# Patient Record
Sex: Female | Born: 1935
Health system: Southern US, Community
[De-identification: ages and names within clinical notes are randomized; demographics above are authoritative.]

## PROBLEM LIST (undated history)

## (undated) DIAGNOSIS — E039 Hypothyroidism, unspecified: Secondary | ICD-10-CM

## (undated) DIAGNOSIS — I495 Sick sinus syndrome: Secondary | ICD-10-CM

## (undated) DIAGNOSIS — I480 Paroxysmal atrial fibrillation: Secondary | ICD-10-CM

## (undated) DIAGNOSIS — Z9889 Other specified postprocedural states: Secondary | ICD-10-CM

## (undated) DIAGNOSIS — Z952 Presence of prosthetic heart valve: Secondary | ICD-10-CM

## (undated) DIAGNOSIS — I350 Nonrheumatic aortic (valve) stenosis: Secondary | ICD-10-CM

## (undated) DIAGNOSIS — K649 Unspecified hemorrhoids: Secondary | ICD-10-CM

## (undated) DIAGNOSIS — K5790 Diverticulosis of intestine, part unspecified, without perforation or abscess without bleeding: Secondary | ICD-10-CM

## (undated) DIAGNOSIS — E785 Hyperlipidemia, unspecified: Secondary | ICD-10-CM

## (undated) DIAGNOSIS — K219 Gastro-esophageal reflux disease without esophagitis: Secondary | ICD-10-CM

## (undated) DIAGNOSIS — R112 Nausea with vomiting, unspecified: Secondary | ICD-10-CM

## (undated) DIAGNOSIS — Z950 Presence of cardiac pacemaker: Secondary | ICD-10-CM

## (undated) DIAGNOSIS — T4145XA Adverse effect of unspecified anesthetic, initial encounter: Secondary | ICD-10-CM

## (undated) DIAGNOSIS — I2510 Atherosclerotic heart disease of native coronary artery without angina pectoris: Secondary | ICD-10-CM

## (undated) DIAGNOSIS — D649 Anemia, unspecified: Secondary | ICD-10-CM

## (undated) DIAGNOSIS — M1990 Unspecified osteoarthritis, unspecified site: Secondary | ICD-10-CM

## (undated) DIAGNOSIS — T8859XA Other complications of anesthesia, initial encounter: Secondary | ICD-10-CM

## (undated) DIAGNOSIS — Z8489 Family history of other specified conditions: Secondary | ICD-10-CM

## (undated) DIAGNOSIS — N189 Chronic kidney disease, unspecified: Secondary | ICD-10-CM

## (undated) DIAGNOSIS — I10 Essential (primary) hypertension: Secondary | ICD-10-CM

## (undated) HISTORY — DX: Hypothyroidism, unspecified: E03.9

## (undated) HISTORY — DX: Unspecified hemorrhoids: K64.9

## (undated) HISTORY — DX: Hyperlipidemia, unspecified: E78.5

## (undated) HISTORY — PX: SQUAMOUS CELL CARCINOMA EXCISION: SHX2433

## (undated) HISTORY — PX: CARDIAC CATHETERIZATION: SHX172

## (undated) HISTORY — DX: Diverticulosis of intestine, part unspecified, without perforation or abscess without bleeding: K57.90

## (undated) HISTORY — PX: TONSILLECTOMY: SUR1361

## (undated) HISTORY — PX: BASAL CELL CARCINOMA EXCISION: SHX1214

## (undated) HISTORY — DX: Gastro-esophageal reflux disease without esophagitis: K21.9

## (undated) HISTORY — DX: Sick sinus syndrome: I49.5

## (undated) HISTORY — DX: Unspecified osteoarthritis, unspecified site: M19.90

## (undated) HISTORY — PX: CATARACT EXTRACTION W/ INTRAOCULAR LENS  IMPLANT, BILATERAL: SHX1307

## (undated) HISTORY — DX: Paroxysmal atrial fibrillation: I48.0

## (undated) HISTORY — PX: FINGER SURGERY: SHX640

## (undated) HISTORY — DX: Essential (primary) hypertension: I10

---

## 1978-03-05 HISTORY — PX: ABDOMINAL HYSTERECTOMY: SHX81

## 1991-03-06 HISTORY — PX: INSERT / REPLACE / REMOVE PACEMAKER: SUR710

## 1997-10-12 ENCOUNTER — Encounter: Admission: RE | Admit: 1997-10-12 | Discharge: 1998-01-10 | Payer: Self-pay | Admitting: *Deleted

## 1999-03-06 HISTORY — PX: PACEMAKER GENERATOR CHANGE: SHX5998

## 2000-01-02 ENCOUNTER — Ambulatory Visit (HOSPITAL_COMMUNITY): Admission: RE | Admit: 2000-01-02 | Discharge: 2000-01-03 | Payer: Self-pay | Admitting: Cardiovascular Disease

## 2000-01-03 ENCOUNTER — Encounter: Payer: Self-pay | Admitting: Cardiovascular Disease

## 2000-03-29 ENCOUNTER — Other Ambulatory Visit: Admission: RE | Admit: 2000-03-29 | Discharge: 2000-03-29 | Payer: Self-pay | Admitting: Family Medicine

## 2000-04-11 ENCOUNTER — Encounter: Payer: Self-pay | Admitting: Internal Medicine

## 2000-04-11 ENCOUNTER — Ambulatory Visit (HOSPITAL_COMMUNITY): Admission: RE | Admit: 2000-04-11 | Discharge: 2000-04-11 | Payer: Self-pay | Admitting: Internal Medicine

## 2000-04-28 ENCOUNTER — Inpatient Hospital Stay (HOSPITAL_COMMUNITY): Admission: EM | Admit: 2000-04-28 | Discharge: 2000-05-01 | Payer: Self-pay | Admitting: Emergency Medicine

## 2000-04-28 ENCOUNTER — Encounter: Payer: Self-pay | Admitting: Cardiovascular Disease

## 2002-10-12 ENCOUNTER — Encounter: Payer: Self-pay | Admitting: Internal Medicine

## 2002-10-12 ENCOUNTER — Encounter: Admission: RE | Admit: 2002-10-12 | Discharge: 2002-10-12 | Payer: Self-pay | Admitting: Internal Medicine

## 2004-03-27 ENCOUNTER — Ambulatory Visit: Payer: Self-pay | Admitting: Internal Medicine

## 2004-03-28 ENCOUNTER — Ambulatory Visit: Payer: Self-pay | Admitting: Internal Medicine

## 2004-04-03 ENCOUNTER — Ambulatory Visit: Payer: Self-pay | Admitting: Internal Medicine

## 2008-04-01 ENCOUNTER — Ambulatory Visit (HOSPITAL_COMMUNITY): Admission: RE | Admit: 2008-04-01 | Discharge: 2008-04-01 | Payer: Self-pay | Admitting: *Deleted

## 2008-04-01 HISTORY — PX: PACEMAKER GENERATOR CHANGE: SHX5998

## 2008-06-10 ENCOUNTER — Ambulatory Visit (HOSPITAL_COMMUNITY): Admission: RE | Admit: 2008-06-10 | Discharge: 2008-06-10 | Payer: Self-pay | Admitting: Vascular Surgery

## 2008-06-15 ENCOUNTER — Ambulatory Visit (HOSPITAL_BASED_OUTPATIENT_CLINIC_OR_DEPARTMENT_OTHER): Admission: RE | Admit: 2008-06-15 | Discharge: 2008-06-15 | Payer: Self-pay | Admitting: Orthopedic Surgery

## 2009-08-15 ENCOUNTER — Encounter: Payer: Self-pay | Admitting: Internal Medicine

## 2010-01-09 ENCOUNTER — Encounter: Payer: Self-pay | Admitting: Internal Medicine

## 2010-03-30 DIAGNOSIS — I10 Essential (primary) hypertension: Secondary | ICD-10-CM | POA: Insufficient documentation

## 2010-03-30 DIAGNOSIS — E785 Hyperlipidemia, unspecified: Secondary | ICD-10-CM | POA: Insufficient documentation

## 2010-03-30 DIAGNOSIS — I4891 Unspecified atrial fibrillation: Secondary | ICD-10-CM | POA: Insufficient documentation

## 2010-03-30 DIAGNOSIS — M1990 Unspecified osteoarthritis, unspecified site: Secondary | ICD-10-CM | POA: Insufficient documentation

## 2010-03-31 ENCOUNTER — Encounter: Payer: Self-pay | Admitting: Internal Medicine

## 2010-03-31 ENCOUNTER — Ambulatory Visit
Admission: RE | Admit: 2010-03-31 | Discharge: 2010-03-31 | Payer: Self-pay | Source: Home / Self Care | Attending: Internal Medicine | Admitting: Internal Medicine

## 2010-03-31 DIAGNOSIS — I495 Sick sinus syndrome: Secondary | ICD-10-CM | POA: Insufficient documentation

## 2010-04-06 NOTE — Assessment & Plan Note (Signed)
Summary: pacer check/medtroic   Visit Type:  Initial Consult Primary Provider:  Dr Creola Corn   History of Present Illness: Ms [name] [name] presents today to establish care in the pacemaker clinic.  She underwent initial pacemaker implantation in 1993 for symptomatic bradycardia and syncome.  She had a pacemaker pulse generator replacement in 2001 by Dr Alanda Amass.  Her most recent pacemaker pulse generator was placed 04/01/08 by Dr Reyes Ivan.  She reports doing very well recently.  She denies symptoms of palpitations, chest pain, shortness of breath, orthopnea, PND, lower extremity edema, dizziness, presyncope, syncope, or neurologic sequela. The patient is tolerating medications without difficulties and is otherwise without complaint today.   Current Medications (verified): 1)  Cartia Xt 240 Mg Xr24h-Cap (Diltiazem Hcl Coated Beads) .... Take 1 Tablet By Mouth Once A Day 2)  Metoprolol Succinate 25 Mg Xr24h-Tab (Metoprolol Succinate) .... Take One Tablet By Mouth Daily 3)  Synthroid 100 Mcg Tabs (Levothyroxine Sodium) .... Once Daily 4)  Nexium 40 Mg Cpdr (Esomeprazole Magnesium) .... Once Daily As Needed 5)  Celebrex 200 Mg Caps (Celecoxib) .... Once Daily As Needed 6)  Aspirin 81 Mg Tbec (Aspirin) .... Take One Tablet By Mouth Daily 7)  Multivitamins   Tabs (Multiple Vitamin) .... Once Daily 8)  Vitamin D 1000 Unit  Tabs (Cholecalciferol) .... Once Daily 9)  Fish Oil   Oil (Fish Oil) .... Once Daily 10)  Icaps Lutein-Zeaxanthin  Cr-Tabs (Specialty Vitamins Products) .... Once Daily 11)  Citracal/vitamin D 250-200 Mg-Unit Tabs (Calcium Citrate-Vitamin D) .... Once Daily  Allergies (verified): 1)  ! Pcn 2)  ! Codeine 3)  ! Demerol 4)  ! * Tetanus  Past History:  Past Medical History: Sick sinus syndrome s/p PPM 1993 Afib OSTEOARTHRITIS HYPERTENSION  HYPERLIPIDEMIA   Past Surgical History:  Status post percutaneous venous pacemaker DDD implant on Jul 17, 1991, at [facility] for  syncope, with documented bradycardia.  Gen change 2001 by Dr Alanda Amass, Most recent pulse generator by Dr Reyes Ivan 03/2008  Reconstruction of ulnar collateral ligament  Social History: Lives in [place] Denies TED  Review of Systems       All systems are reviewed and negative except as listed in the HPI.   Vital Signs:  Patient profile:   77 year old female Height:      66 inches Weight:      119 pounds BMI:     19.28 Pulse rate:   58 / minute BP sitting:   138 / 70  (left arm) Cuff size:   regular  Vitals Entered By: Hardin Negus, RMA (March 31, 2010 9:23 AM)  Physical Exam  General:  thin elderly female, NAD Head:  normocephalic and atraumatic Eyes:  PERRLA/EOM intact; conjunctiva and lids normal. Mouth:  Teeth, gums and palate normal. Oral mucosa normal. Neck:  supple Chest Wall:  R sided pacemaker is well healed Lungs:  Clear bilaterally to auscultation and percussion. Heart:  RRR, 2/6 SEM LUSB Abdomen:  Bowel sounds positive; abdomen soft and non-tender without masses, organomegaly, or hernias noted. No hepatosplenomegaly. Msk:  Back normal, normal gait. Muscle strength and tone normal. Extremities:  No clubbing or cyanosis. Neurologic:  Alert and oriented x 3.   PPM Specifications Following MD:  Hillis Range, MD     Referring MD:  Vonna Drafts PPM Vendor:  Medtronic     PPM Model Number:  [id number]     PPM Serial Number:  [id number] PPM DOI:  04/01/2008     PPM Implanting  MD:  Charlynn Court  Lead 1    Location: RA     DOI: 07/17/1991     Model #: [id number]     Serial #: [id number]     Status: active Lead 2    Location: RV     DOI: 07/17/1991     Model #: [id number]     Serial #: [id number]     Status: active  Magnet Response Rate:  BOL 85 ERI 65  Indications:  Sick sinus syndrome   PPM Follow Up Battery Voltage:  2.79 V     Battery Est. Longevity:  13.5 yrs       PPM Device Measurements Atrium  Amplitude: 4.00 mV, Impedance: 661 ohms, Threshold: 0.50 V at 0.40  msec Right Ventricle  Amplitude: 15.68 mV, Impedance: 663 ohms, Threshold: 1.750 V at 0.40 msec  Episodes MS Episodes:  1228     Percent Mode Switch:  <0.1%     Ventricular High Rate:  0     Atrial Pacing:  59.5%     Ventricular Pacing:  0.3%  Parameters Mode:  MVP     Lower Rate Limit:  55     Upper Rate Limit:  130 Paced AV Delay:  150     Sensed AV Delay:  120 Next Cardiology Appt Due:  09/04/2010 Tech Comments:  1228 MODE SWITCHES--LONGEST WAS 2 MIN 9 SECONDS.  +ASA. NORMAL DEVICE FUNCTION.  CHANGED MIN ADAPTED OUTPUT.  CHANGED RA OUTPUT FRO M1.5 TO 2.0 AND RV OUTPUT FROM 3.750 TO 3.5 V. ROV IN 6 MTHS W/DEVICE CLINIC. Vella Kohler  March 31, 2010 9:31 AM MD Comments:  agree most fast atrial sensing appears to be far field oversensing on atrial channel  Impression & Recommendations:  Problem # 1:  BRADYCARDIA-TACHYCARDIA SYNDROME (ICD-427.81) normal pacemaker function no changes as above  Problem # 2:  ATRIAL FIBRILLATION (ICD-427.31) very low atrial fib burden by PPM.  If afib burden increases, will consider coumadin  Problem # 3:  HYPERTENSION (ICD-401.9) stable  Patient Instructions: 1)  Your physician wants you to follow-up in:  6 months in the device clinic and 12 months with Dr Jacquiline Doe will receive a reminder letter in the mail two months in advance. If you don't receive a letter, please call our office to schedule the follow-up appointment. 2)  Your physician recommends that you continue on your current medications as directed. Please refer to the Current Medication list given to you today.

## 2010-04-06 NOTE — Miscellaneous (Signed)
Summary: Device preload  Clinical Lists Changes  Observations: Added new observation of PPM INDICATN: Sick sinus syndrome (01/09/2010 9:05) Added new observation of MAGNET RTE: BOL 85 ERI 65 (01/09/2010 9:05) Added new observation of PPMLEADSTAT2: active (01/09/2010 9:05) Added new observation of PPMLEADSER2: 161096  (01/09/2010 9:05) Added new observation of PPMLEADMOD2: 4261  (01/09/2010 9:05) Added new observation of PPMLEADDOI2: 07/17/1991  (01/09/2010 9:05) Added new observation of PPMLEADLOC2: RV  (01/09/2010 9:05) Added new observation of PPMLEADSTAT1: active  (01/09/2010 9:05) Added new observation of PPMLEADSER1: 045409  (01/09/2010 9:05) Added new observation of PPMLEADMOD1: 4271  (01/09/2010 9:05) Added new observation of PPMLEADDOI1: 07/17/1991  (01/09/2010 9:05) Added new observation of PPMLEADLOC1: RA  (01/09/2010 9:05) Added new observation of PPM DOI: 04/01/2008  (01/09/2010 9:05) Added new observation of PPM SERL#: [id number]  (01/09/2010 9:05) Added new observation of PPM MODL#: [id number]  (01/09/2010 9:56) Added new observation of PACEMAKERMFG: Medtronic  (01/09/2010 9:05) Added new observation of PPM IMP MD: Charlynn Court  (01/09/2010 9:05) Added new observation of PPM REFER MD: Vonna Drafts  (01/09/2010 9:05) Added new observation of PACEMAKER MD: Hillis Range, MD  (01/09/2010 9:05)      PPM Specifications Following MD:  Hillis Range, MD     Referring MD:  Vonna Drafts PPM Vendor:  Medtronic     PPM Model Number:  ADDRL1     PPM Serial Number:  [id number] PPM DOI:  04/01/2008     PPM Implanting MD:  Charlynn Court  Lead 1    Location: RA     DOI: 07/17/1991     Model #: 4271     Serial #: [id number]     Status: active Lead 2    Location: RV     DOI: 07/17/1991     Model #: 4261     Serial #: [id number]     Status: active  Magnet Response Rate:  BOL 85 ERI 65  Indications:  Sick sinus syndrome

## 2010-04-12 NOTE — Cardiovascular Report (Signed)
Summary: Office Visit   Office Visit   Imported By: Roderic Ovens 04/05/2010 15:16:23  _____________________________________________________________________  External Attachment:    Type:   Image     Comment:   External Document

## 2010-04-20 NOTE — Letter (Signed)
Summary: [facility]  [facility]   Imported By: Marylou Mccoy 04/12/2010 17:27:52  _____________________________________________________________________  External Attachment:    Type:   Image     Comment:   External Document

## 2010-06-14 LAB — POCT I-STAT, CHEM 8
BUN: 26 mg/dL — ABNORMAL HIGH (ref 6–23)
Calcium, Ion: 1.18 mmol/L (ref 1.12–1.32)
Chloride: 106 mEq/L (ref 96–112)
Creatinine, Ser: 1.2 mg/dL (ref 0.4–1.2)
Glucose, Bld: 97 mg/dL (ref 70–99)
HCT: 46 % (ref 36.0–46.0)
Hemoglobin: 15.6 g/dL — ABNORMAL HIGH (ref 12.0–15.0)
Potassium: 4.1 mEq/L (ref 3.5–5.1)
Sodium: 140 mEq/L (ref 135–145)
TCO2: 27 mmol/L (ref 0–100)

## 2010-06-14 LAB — BASIC METABOLIC PANEL
BUN: 19 mg/dL (ref 6–23)
CO2: 30 mEq/L (ref 19–32)
Calcium: 9.5 mg/dL (ref 8.4–10.5)
Chloride: 106 mEq/L (ref 96–112)
Creatinine, Ser: 0.98 mg/dL (ref 0.4–1.2)
GFR calc Af Amer: >60 mL/min (ref >60)
GFR calc non Af Amer: 55 mL/min — ABNORMAL LOW (ref >60)
Glucose, Bld: 98 mg/dL (ref 70–99)
Potassium: 4.7 mEq/L (ref 3.5–5.1)
Sodium: 141 mEq/L (ref 135–145)

## 2010-06-14 LAB — CBC
HCT: 41.4 % (ref 36.0–46.0)
Hemoglobin: 14.2 g/dL (ref 12.0–15.0)
MCHC: 34.4 g/dL (ref 30.0–36.0)
MCV: 101.3 fL — ABNORMAL HIGH (ref 78.0–100.0)
Platelets: 181 10*3/uL (ref 150–400)
RBC: 4.08 MIL/uL (ref 3.87–5.11)
RDW: 14 % (ref 11.5–15.5)
WBC: 6.4 10*3/uL (ref 4.0–10.5)

## 2010-06-22 ENCOUNTER — Other Ambulatory Visit: Payer: Self-pay | Admitting: Cardiology

## 2010-06-22 DIAGNOSIS — I4891 Unspecified atrial fibrillation: Secondary | ICD-10-CM

## 2010-06-22 NOTE — Telephone Encounter (Signed)
escribe medication per fax request  

## 2010-07-18 NOTE — Op Note (Signed)
NAME:  [name] [name], [name] [name]                ACCOUNT NO.:  [id number]   MEDICAL RECORD NO.:  [id number]          PATIENT TYPE:  AMB   LOCATION:  DSC                          FACILITY:  [facility]   PHYSICIAN:  Katy Fitch. Sypher, M.D. DATE OF BIRTH:  Jun 06, 1929   DATE OF PROCEDURE:  06/15/2008  DATE OF DISCHARGE:                               OPERATIVE REPORT   PREOPERATIVE DIAGNOSIS:  Chronic instability, left thumb ulnar  collateral ligament due to traumatic rupture sustained on May 14, 2008, in a fall.   POSTOPERATIVE DIAGNOSIS:  Complete disruption of ulnar collateral  ligament and ulnar insertion of volar plate at base of proximal phalanx.   OPERATION:  Reconstruction of ulnar collateral ligament with a distally  based abductor pollicis longus tendon graft utilizing the dorsal slip of  the abductor pollicis longus passed deep to the extensor pollicis brevis  and inserted at the ulnar critical corner with imbrication of the ulnar  volar plate insertion utilizing a Juggernaut suture anchor and the  capsular reefing.   OPERATING SURGEON:  Katy Fitch. Sypher, MD   ASSISTANT:  Marveen Reeks Dasnoit, PA-C   ANESTHESIA:  Axillary block supplemented by IV sedation.   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 Creola Corn for evaluation and  management of an unstable left thumb.  She had fallen on May 14, 2008,  sustaining an obvious rupture of the ulnar collateral ligament of her  left nondominant thumb.  Her pinch apprehension was very poor with  approaching 90 degrees of radial deviation instability and marked  swelling over the ulnar aspect of metacarpophalangeal joint.   We saw her in consultation on May 19, 2008 and recommended proceeding  with reconstruction of the ulnar collateral ligament.  She initially  declined reconstruction of the ulnar collateral ligament  initially  deciding to live with her instability.  However, after several weeks she  noted marked hand impairment and called requesting that reconstructive  surgery be scheduled.   She is brought to the operating room at this time anticipating a  reconstruction of the left thumb ulnar collateral ligament utilizing a  distally based abductor pollicis longus tendon graft.   [name] [name] had also been seen by Dr. Peter Swaziland of [facility] preoperatively to check her pacemaker function and  to evaluate a recent episode of near syncope.  Dr. Swaziland cleared Ms.  [name] for outpatient anesthesia.   After informed consent, she is brought to the operating room at this  time.   PROCEDURE:  [name] [name] is brought to the operating room and placed  in supine position on the operating table.   Following placement of an axillary block by Dr. Jean Rosenthal in the holding  area, excellent anesthesia of the left upper extremities obtained.  She  is brought to room 2 of [facility], placed supine position on  the operating table and under Dr. Edison Pace supervision, sedation  provided.  The left arm was prepped with Betadine soap  solution and  sterilely draped.  A pneumatic tourniquet was applied proximal of left  brachium.  Upon exsanguination of left arm with an Esmarch bandage,  arterial tourniquet was inflated to 250 mmHg due to mild systolic  hypertension.  The ulnar collateral ligament region of the left thumb  metacarpophalangeal joint was exposed through a curvilinear incision.  There was marked disruption of the entire ulnar capsule with edematous  fibrotic tissue.  The extensor tendon was released with the sagittal  fibers being released distal to the collateral ligament and the extensor  pollicis longus retracted radially.  A capsular flap was created to  protect the radial superficial sensory branches on the ulnar aspect of  the thumb followed by exposure of  the remnants of the ulnar collateral  ligament.   The remnants were resected followed by a creation of a bony trough at  the metacarpal head level to anchor the proximal portion of the tendon  graft.  The ulnar critical corner was exposed and a complete rupture of  the collateral ligament noted as well as the ulnar insertion of the  volar plate.  A Biomet Juggernaut was placed at the critical corner with  standard technique to provide a suture point.  The tails of this were  then sutured through the remnants of the volar plate recreating the  critical corner.  The dorsal slip of the abductor pollicis longus was  released to the musculotendinous junction and brought out through 2  transverse incisions deep to the extensor pollicis brevis into the  metacarpophalangeal joint wound.  This was tunneled along the proper  angle of alignment to recreate the ulnar collateral ligament and  subsequently tensioned at the ulnar critical corner.  Prior to our final  inset of the graft, the metacarpophalangeal joint was placed at 5  degrees flexion, slight ulnar deviation, and slight supination followed  by securing the joint with a 0.025-inch Kirschner wire.  AP and lateral  C-arm images confirmed satisfactory position of the K-wire and adequate  reduction of the MP joint.  The tail of the suture was then woven in  mattress manner through the collateral ligament graft and the volar  plate recreating the critical corner anatomy.  After this was tied, a 4-  0 Mersilene was used to finish the repair, securing the dorsal capsule  over the abductor pollicis longus tendon slips at an anatomic origin.  The capsule was then repaired with a pants-over-vest repair as were the  ulnar sagittal fibers properly repositioning the extensor pollicis  longus.   A very strong and anatomic repair was achieved.   The wound was then irrigated repaired with intradermal 3-0 Prolene  suture.  The donor site wounds were  repaired with intradermal 3-0  Prolene and Steri-Strips.   The wounds were dressed with Xeroflo sterile gauze and a forearm-based  thumb spica splint.  There were no apparent complications.      Katy Fitch Sypher, M.D.  Electronically Signed     RVS/MEDQ  D:  06/15/2008  T:  06/16/2008  Job:  [id number]   cc:   Gwen Pounds, MD

## 2010-07-21 NOTE — Op Note (Signed)
[place]  Patient:    [name] [name], [name] [name]                       MRN: [id number] Proc. Date: 01/02/00 Adm. Date:  [id number] Attending:  Ruta Hinds CC:         Cardiac Catheterization Laboratory  Lenise Herald, M.D.   Operative Report  PROCEDURE:  Estimated _______ of end-of-life ___________ ___________ ______ [name], [id number] explant, with implantation of new DDDR Medtronic [id number] Kappa, QI:[id number] H.  IMPLANTING PHYSICIAN:  Dr. Pearletha Furl. Alanda Amass.  COMPLICATIONS:  None.  ESTIMATED BLOOD LOSS:  Approximately 25 cc.  ANESTHESIA:  Valium 5 mg p.o. premedication, 1% local Xylocaine, 4 mg of Nubain in divided doses during the procedure.  PREOPERATIVE DIAGNOSIS: 1. Sick sinus syndrome, symptomatic bradycardia, and supraventricular    tachycardia, on medical therapy. 2. Status post percutaneous venous pacemaker DDD implant on Jul 17, 1991,    at [facility] for syncope, with documented bradycardia. 3. Mild systemic hypertension. 4. Hyperlipidemia. 5. Osteoarthritis.  POSTOPERATIVE DIAGNOSIS: 1. Sick sinus syndrome, symptomatic bradycardia, and supraventricular    tachycardia, on medial therapy. 2. Status post percutaneous venous pacemaker DDD implant on Jul 15, 1991,    at [facility] for syncope, with documented bradycardia. 3. Mild systemic hypertension. 4. Hyperlipidemia. 5. Osteoarthritis.  THRESHOLDS: EXISTING LEADS: Atrial leads:  CPI #427, [id number]. Ventricular electrode:  CPI #4261, [id number].  UNIPOLAR ATRIAL THRESHOLD: Threshold for capture:  0.8V. Impedance:  604 ohms. P-wave:  5.4MV.  UNIPOLAR VENTRICLE: Threshold:  1.9V. Impedance:  505 ohms. R-wave:  11.7MV.  BIPOLAR ATRIAL: Threshold:  0.9V. Impedance:  668 ohms. P equals 5.4MV.  BIPOLAR VENTRICLE: Threshold:  1.9V. Impedance:  538 ohms. R equals 13.1 MV.  There was 1:1 retrograde V-A conduction up to a ventricular pacing rate  of 140 per minute, when there was retrograde Wenckebach.  Retrograde conduction was fast at 160 msec with testing through the previously-implanted electrodes.  There was no diaphragmatic pacing on unipolar or bipolar pacing on PSA testing.  R-waves were biphasic, predominantly negative, with still some mild current of injury present.  P-waves were in good slew rate.  P-waves were good on intercardiac electrograms.  DESCRIPTION OF PROCEDURE:  The patient was brought to the second floor CP laboratory in a postabsorptive state after 5 mg of Valium p.o. premedication. The right anterior chest was prepped and draped in the usual manner. Xylocaine 1% was used for local anesthesia.  The patient was given intermittent Nubain for sedation in the laboratory.  An elliptical bivalve curvilinear incision was performed to encircle the previous infraclavicular pacemaker scar.  The small scar was removed and blunt dissection was used along with electrocautery to control hemostasis to expose the fibrous pacemaker capsule.  The pacemaker capsule was incised after fluoroscopic visualization, to assure no damage to the electrodes, which were positioned behind the generator.  The fibrous capsule was opened.  The generator was freed up from the fibrous capsule, and removed from the pocket.  The electrodes appeared intact, including the headers, and were found to be IS1 electrodes bipolar.  The atrial and ventricular electrodes were confirmed. The atrial electrode was also marked with a red stripe.  The hemostasis was controlled using electrocautery.  Atrial and ventricular unipolar and bipolar measurements with thresholds were performed using the PSA.  Retrograde conduction study was done with ventricular pacing, and atrial recording through the implanted electrodes.  Fluoroscopy showed good position of the ventricular  electrode, and good position of the atrial electrode, despite some mild redundancy and  rotation of the atrial loop.  The electrode was stable. Adequate threshold measurements were performed.  The generator pocket was irrigated with 500 mg of kanamycin solution.  Intercardiac electrograms were obtained.  The generator performed to the manufacturers specifications on PSA testing at a pulse width of 0.4 msec, atrial output 3.7, ventricular output 3.6 volts.  The generator was hooked to the electrode to the proper atrial and ventricular sequence, and a single hex nut tightened for each electrode.  It was delivered into the pocket in a relatively vertical position as the other generator had been, and loosely secured to the underlying posterior capsule and underlying muscular layer with a single #1 silk suture to prevent migration.  The sponge count was correct, after the pocket was irrigated with 500 mg of kanamycin solution, prior to inserting the generator.  The subcutaneous tissue was closed with two separate running layers of #2-0 Dexon suture, and the skin was closed with #5-0 subcuticular Dexon suture. Steri-Strips were applied.  The fluoroscopy showed good position of the atrial and ventricular electrodes.  The patient was transferred to the holding area for postoperative interrogation and programming.  She tolerated the procedure well. DD:  01/02/00 TD:  01/02/00 Job: [id number]

## 2010-07-21 NOTE — Discharge Summary (Signed)
[place]  Patient:    [name] [name], [name] [name]                       MRN: [id number] Adm. Date:  [id number] Disc. Date: [id number] Attending:  Berry, Jonathan Swaziland CC:         Angelena Sole, M.D. [facility]  Richard A. Alanda Amass, M.D.   Discharge Summary  ADMITTING DIAGNOSES: 1. Atrial fibrillation with rapid ventricular response. 2. Hyperlipidemia. 3. History of ______ status post generator change in October 2001.  OUTPATIENT MEDICATIONS: 1. Cardizem 240 mg q.d. 2. Synthroid _1 mcg q.d. 3. Estrace 0.5 mg q.d. 4. Vitamin E 240 international units q.d.  HISTORY OF PRESENT ILLNESS:  [name] [name] is a 77 year old white female patient of Dr. Alanda Amass who underwent a generator change in October 2001 performed by Dr. Alanda Amass.  She has been doing well since then and last office visit in November 2000 when all of a sudden patient went into sinus irregular rhythm with shortness of breath and she was delivered to the [facility] Emergency Room and at the time of arrival EKG showed atrial fibrillation with heart rate of 100.  Patient has no medical history of coronary artery disease or hypertension.  Patient denied any cardiovascular accident and there is a history of old presyncope which happened in November 2001 and at that time cause was not determined.  Patient was evaluated by Dr. Allyson Sabal and admitted with a plan to start on IV diltiazem and heparin and if this plan were to be unsuccessful then the patient would be converted during TEE.  Her labs done at admission were followed.  Cardiac enzymes were negative. Blood count was 5.7, hemoglobin 13.6, hematocrit 39.7, and platelets 194. Sodium 140, potassium 3.9, chloride 109, carbon dioxide 26, glucose 103, BUN 18, and creatinine 0.9.  Patient was successfully converted to sinus rhythm on IV diltiazem and she was started on Coumadin with decrease in dose of heparin.  At the time of discharge on May 01, 2000  patient was stable, in normal sinus rhythm, and her INR level was therapeutic.  She was discharged home in stable condition.  DISCHARGE DIAGNOSES: 1. Atrial fibrillation, now resolved, in normal sinus rhythm.  Continue p.o.    Cardizem. 2. Anticoagulation therapy.  Patient is on Coumadin.  We will monitor PT and    INR outpatient.  MEDICATIONS ON DISCHARGE: 1. Diltiazem 240 mg one p.o. q.d. 2. _Synthroid 100 mcg one pill q.d. 3. Estradiol 0.5 mg one pill q.d. 4. Toprol XL 25 mg one pill q.d. 5. Coumadin 5 mg one pill q.d. 6. Vitamin E 400 international units one pill q.d.  FOLLOWUP APPOINTMENT:  Dr. Alanda Amass was scheduled in two weeks on May 13, 2000. DD:  05/10/00 TD:  05/11/00 Job: [id number]

## 2010-08-25 ENCOUNTER — Encounter: Payer: Self-pay | Admitting: *Deleted

## 2010-08-31 ENCOUNTER — Ambulatory Visit (INDEPENDENT_AMBULATORY_CARE_PROVIDER_SITE_OTHER): Payer: Medicare Other | Admitting: *Deleted

## 2010-08-31 DIAGNOSIS — I495 Sick sinus syndrome: Secondary | ICD-10-CM

## 2010-08-31 LAB — PACEMAKER DEVICE OBSERVATION

## 2010-08-31 NOTE — Progress Notes (Signed)
Pacer checked in office./ rov 6 months with Dr. Johney Frame.

## 2010-09-01 ENCOUNTER — Encounter: Payer: Self-pay | Admitting: Cardiology

## 2010-09-01 ENCOUNTER — Ambulatory Visit (INDEPENDENT_AMBULATORY_CARE_PROVIDER_SITE_OTHER): Payer: Medicare Other | Admitting: Cardiology

## 2010-09-01 VITALS — BP 146/84 | HR 60 | Ht 66.0 in | Wt 120.0 lb

## 2010-09-01 DIAGNOSIS — I350 Nonrheumatic aortic (valve) stenosis: Secondary | ICD-10-CM | POA: Insufficient documentation

## 2010-09-01 DIAGNOSIS — R011 Cardiac murmur, unspecified: Secondary | ICD-10-CM

## 2010-09-01 DIAGNOSIS — I359 Nonrheumatic aortic valve disorder, unspecified: Secondary | ICD-10-CM

## 2010-09-01 DIAGNOSIS — I495 Sick sinus syndrome: Secondary | ICD-10-CM

## 2010-09-01 DIAGNOSIS — I4891 Unspecified atrial fibrillation: Secondary | ICD-10-CM

## 2010-09-01 NOTE — Assessment & Plan Note (Signed)
No significant recurrences of atrial fibrillation based on her pacemaker data. I would continue her with aspirin daily.

## 2010-09-01 NOTE — Patient Instructions (Signed)
We will schedule you for an Echocardiogram to evaluate your murmur.  Continue your other therapy.  I will see you back in 1 year.

## 2010-09-01 NOTE — Assessment & Plan Note (Signed)
Status post permanent pacemaker implant was stable pacemaker function. She will continue followup in our pacemaker clinic.

## 2010-09-01 NOTE — Progress Notes (Signed)
Gerald Leitz Cuneo Date of Birth: October 28, 1929   History of Present Illness: Mrs. Schlauch is seen today for followup. She states she has done very well over the past year. She denies any symptoms of chest pain, dizziness, palpitations, or shortness of breath. She did have to stop taking Vytorin because of myalgias. She had a recent pacemaker check which demonstrated normal pacemaker function. She had very few atrial high rate episodes all lasting less than one minute.  Current Outpatient Prescriptions on File Prior to Visit  Medication Sig Dispense Refill  . aspirin 81 MG tablet Take 81 mg by mouth daily.        . calcium carbonate (OS-CAL) 600 MG TABS Take 600 mg by mouth daily.        . celecoxib (CELEBREX) 200 MG capsule Take 200 mg by mouth as needed.        . Cholecalciferol (VITAMIN D) 1000 UNITS capsule Take 1,000 Units by mouth daily.        Marland Kitchen diltiazem (CARDIZEM CD) 240 MG 24 hr capsule Take 240 mg by mouth daily.        Marland Kitchen esomeprazole (NEXIUM) 40 MG capsule Take 40 mg by mouth as needed.        . fish oil-omega-3 fatty acids 1000 MG capsule Take 1 g by mouth daily.        Marland Kitchen levothyroxine (SYNTHROID, LEVOTHROID) 100 MCG tablet Take 100 mcg by mouth daily.        . metoprolol tartrate (LOPRESSOR) 25 MG tablet TAKE 1 TABLET BY MOUTH EVERY DAY  90 tablet  3  . Multiple Vitamins-Minerals (ICAPS) CAPS Take by mouth daily.        . multivitamin (THERAGRAN) per tablet Take 1 tablet by mouth daily.          Allergies  Allergen Reactions  . Codeine   . Demerol   . Meperidine Hcl   . Penicillins   . Tetanus Toxoid     Past Medical History  Diagnosis Date  . Hyperlipidemia   . Hypertension   . Sick sinus syndrome   . Syncope   . Osteoarthritis   . Paroxysmal atrial fibrillation   . Hypothyroidism     Past Surgical History  Procedure Date  . Insert / replace / remove pacemaker 1993    for syptomatic bradycardia and syncope -- in [facility]  . Insert / replace / remove  pacemaker 2001    pulse generator replacement by Dr. Alanda Amass   . Insert / replace / remove pacemaker 04/01/2008    PPM Medtronic -- model # [id number] serial # [id number] -- pulse generator replacement by Dr. Reyes Ivan   . Abdominal hysterectomy     History  Smoking status  . Former Smoker  . Quit date: 03/05/1978  Smokeless tobacco  . Never Used    History  Alcohol Use No    Family History  Problem Relation Age of Onset  . Cancer Father     lung cancer  . Alzheimer's disease Mother     Review of Systems: The review of systems is positive for myalgias on Vytorin.  All other systems were reviewed and are negative.  Physical Exam: BP 146/84  Pulse 60  Ht 5\' 6"  (1.676 m)  Wt 120 lb (54.432 kg)  BMI 19.37 kg/m2 She is a pleasant white female in no acute distress. She's normocephalic, atraumatic. Pupils are equal round and reactive to light accommodation. Extraocular movements are full. Oropharynx is clear. Neck  is supple without JVD, adenopathy, or thyromegaly. Her carotid upstrokes are brisk with radiated murmur. Lungs are clear. Cardiac exam reveals a harsh grade 2/6 systolic murmur heard best at the right upper sternal border radiating to the apex and to the carotids. Abdomen is thin, soft, nontender. She has no edema. Pedal pulses are palpable. She is alert and oriented x3. Cranial nerves II through XII are intact. LABORATORY DATA: Date Jul 20, 2010 complete chemistry panel and CBC were normal. Total cholesterol 234, triglycerides 89, HDL 87, LDL 129. Thyroid studies were normal. Apolipoprotein B. Was 71. ECG today demonstrates atrially paced rhythm, otherwise normal.  Assessment / Plan:

## 2010-09-01 NOTE — Assessment & Plan Note (Signed)
She has a murmur consistent with aortic stenosis. Her last echocardiogram was in 2002. We will repeat an echocardiogram now.

## 2010-09-04 ENCOUNTER — Encounter: Payer: Self-pay | Admitting: Internal Medicine

## 2010-09-04 ENCOUNTER — Encounter: Payer: Self-pay | Admitting: *Deleted

## 2010-09-04 ENCOUNTER — Encounter: Payer: Self-pay | Admitting: Cardiology

## 2010-09-08 ENCOUNTER — Ambulatory Visit (HOSPITAL_COMMUNITY): Payer: Medicare Other | Attending: Cardiology | Admitting: Radiology

## 2010-09-08 DIAGNOSIS — R011 Cardiac murmur, unspecified: Secondary | ICD-10-CM

## 2010-09-08 DIAGNOSIS — I4891 Unspecified atrial fibrillation: Secondary | ICD-10-CM | POA: Insufficient documentation

## 2010-09-08 DIAGNOSIS — I080 Rheumatic disorders of both mitral and aortic valves: Secondary | ICD-10-CM | POA: Insufficient documentation

## 2010-09-08 DIAGNOSIS — I079 Rheumatic tricuspid valve disease, unspecified: Secondary | ICD-10-CM | POA: Insufficient documentation

## 2010-09-08 DIAGNOSIS — I350 Nonrheumatic aortic (valve) stenosis: Secondary | ICD-10-CM

## 2010-09-08 DIAGNOSIS — I10 Essential (primary) hypertension: Secondary | ICD-10-CM | POA: Insufficient documentation

## 2010-09-08 DIAGNOSIS — E785 Hyperlipidemia, unspecified: Secondary | ICD-10-CM | POA: Insufficient documentation

## 2010-09-12 ENCOUNTER — Telehealth: Payer: Self-pay | Admitting: *Deleted

## 2010-09-12 ENCOUNTER — Other Ambulatory Visit: Payer: Self-pay | Admitting: Cardiology

## 2010-09-12 MED ORDER — DILTIAZEM HCL ER COATED BEADS 240 MG PO CP24: 240.0000 mg | ORAL_CAPSULE | Freq: Every day | ORAL | Status: DC

## 2010-09-12 NOTE — Telephone Encounter (Signed)
Message copied by Lorayne Bender on Tue Sep 12, 2010 10:59 AM ------      Message from: [place], PETER M      Created: Mon Sep 11, 2010  5:22 PM       Mild aortic stenosis, mild mitral insufficiency. LV is normal. Please report.

## 2010-09-12 NOTE — Telephone Encounter (Signed)
Lm w/ echo results. Advised to call back if has questions.

## 2010-09-12 NOTE — Telephone Encounter (Signed)
escribe medication per fax request  

## 2011-03-05 ENCOUNTER — Ambulatory Visit (INDEPENDENT_AMBULATORY_CARE_PROVIDER_SITE_OTHER): Payer: Medicare Other | Admitting: Internal Medicine

## 2011-03-05 ENCOUNTER — Encounter: Payer: Self-pay | Admitting: Internal Medicine

## 2011-03-05 DIAGNOSIS — I4891 Unspecified atrial fibrillation: Secondary | ICD-10-CM

## 2011-03-05 DIAGNOSIS — I495 Sick sinus syndrome: Secondary | ICD-10-CM

## 2011-03-05 LAB — PACEMAKER DEVICE OBSERVATION
AL IMPEDENCE PM: 638 Ohm
ATRIAL PACING PM: 42
BATTERY VOLTAGE: 2.79 V
RV LEAD AMPLITUDE: 15.68 mv
RV LEAD IMPEDENCE PM: 703 Ohm

## 2011-03-05 NOTE — Assessment & Plan Note (Signed)
Nonsustained atach and pacs on PPM interrogation today, no afib in the last 12 months Continue ASA as per Dr Swaziland.

## 2011-03-05 NOTE — Assessment & Plan Note (Signed)
Normal pacemaker function See Pace Art report No changes today  

## 2011-03-05 NOTE — Progress Notes (Signed)
PCP:  Gwen Pounds, MD, MD  The patient presents today for routine electrophysiology followup.  Since last being seen in our clinic, the patient reports doing very well.  Today, she denies symptoms of palpitations, chest pain, shortness of breath,dizziness, presyncope, or syncope.  The patient feels that she is tolerating medications without difficulties and is otherwise without complaint today.   Past Medical History  Diagnosis Date  . Hyperlipidemia   . Hypertension   . Sick sinus syndrome     s/p PPM (MDT)  . Syncope   . Osteoarthritis   . Paroxysmal atrial fibrillation   . Hypothyroidism   . Aortic stenosis     mild   Past Surgical History  Procedure Date  . Insert / replace / remove pacemaker 1993    for syptomatic bradycardia and syncope -- in [facility]  . Insert / replace / remove pacemaker 2001    pulse generator replacement by Dr. Alanda Amass   . Insert / replace / remove pacemaker 04/01/2008    PPM Medtronic -- model # [id number] serial # [id number] -- pulse generator replacement by Dr. Reyes Ivan   . Abdominal hysterectomy     Current Outpatient Prescriptions  Medication Sig Dispense Refill  . aspirin 81 MG tablet Take 81 mg by mouth daily.        . calcium carbonate (OS-CAL) 600 MG TABS Take 600 mg by mouth daily.        . celecoxib (CELEBREX) 200 MG capsule Take 200 mg by mouth as needed.        . Cholecalciferol (VITAMIN D) 1000 UNITS capsule Take 1,000 Units by mouth daily.        Marland Kitchen diltiazem (CARDIZEM CD) 240 MG 24 hr capsule Take 1 capsule (240 mg total) by mouth daily.  30 capsule  5  . esomeprazole (NEXIUM) 40 MG capsule Take 40 mg by mouth as needed.        . fish oil-omega-3 fatty acids 1000 MG capsule Take 1 g by mouth 2 (two) times daily.       Marland Kitchen levothyroxine (SYNTHROID, LEVOTHROID) 100 MCG tablet Take 100 mcg by mouth daily.        . metoprolol tartrate (LOPRESSOR) 25 MG tablet TAKE 1 TABLET BY MOUTH EVERY DAY  90 tablet  3  . Multiple Vitamins-Minerals  (ICAPS) CAPS Take by mouth 2 (two) times daily.       . multivitamin (THERAGRAN) per tablet Take 1 tablet by mouth daily.          Allergies  Allergen Reactions  . Codeine   . Demerol   . Meperidine Hcl   . Penicillins   . Tetanus Toxoid     History   Social History  . Marital Status: Widowed    Spouse Name: N/A    Number of Children: 0  . Years of Education: N/A   Occupational History  .     Social History Main Topics  . Smoking status: Former Smoker    Quit date: 03/05/1978  . Smokeless tobacco: Never Used  . Alcohol Use: No  . Drug Use: No  . Sexually Active: Not on file   Other Topics Concern  . Not on file   Social History Narrative  . No narrative on file    Family History  Problem Relation Age of Onset  . Cancer Father     lung cancer  . Alzheimer's disease Mother     ROS-  All systems are reviewed and are  negative except as outlined in the HPI above   Physical Exam: Filed Vitals:   03/05/11 1002  BP: 132/78  Pulse: 58  Height: 5\' 6"  (1.676 m)  Weight: 120 lb 6.4 oz (54.613 kg)    GEN- The patient is well appearing, alert and oriented x 3 today.   Head- normocephalic, atraumatic Eyes-  Sclera clear, conjunctiva pink Ears- hearing intact Oropharynx- clear Neck- supple, no JVP Lymph- no cervical lymphadenopathy Lungs- Clear to ausculation bilaterally, normal work of breathing Chest- pacemaker pocket is well healed Heart- Regular rate and rhythm, no murmurs, rubs or gallops, PMI not laterally displaced GI- soft, NT, ND, + BS Extremities- no clubbing, cyanosis, or edema  Pacemaker interrogation- reviewed in detail today,  See PACEART report  Assessment and Plan:

## 2011-03-05 NOTE — Patient Instructions (Signed)
Your physician wants you to follow-up in: 6 months with device clinic and 12 months with Dr Allred You will receive a reminder letter in the mail two months in advance. If you don't receive a letter, please call our office to schedule the follow-up appointment.  

## 2011-03-11 ENCOUNTER — Other Ambulatory Visit: Payer: Self-pay | Admitting: Cardiology

## 2011-06-08 ENCOUNTER — Telehealth: Payer: Self-pay | Admitting: Cardiology

## 2011-06-08 MED ORDER — METOPROLOL TARTRATE 25 MG PO TABS: 25.0000 mg | ORAL_TABLET | Freq: Two times a day (BID) | ORAL | Status: DC

## 2011-06-08 NOTE — Telephone Encounter (Signed)
New PRoblem:     Patient called in concerned because she has been having a large increase in the frequency of her Afib.  Patient, while in regular rhythm at the moment, would like to know how to proceed.  Please call back.

## 2011-06-08 NOTE — Telephone Encounter (Signed)
Patient called, stating she has had more atrial fib.States started 1 week ago when she went to [place] to take care of sick sister.States episodes last appox 2 to 3 seconds,but had one episode lasted 2 hours.Spoke to [name] he advised to increase metoprolol 25 mg twice daily.Advised to call back if not better.

## 2011-06-13 ENCOUNTER — Ambulatory Visit (INDEPENDENT_AMBULATORY_CARE_PROVIDER_SITE_OTHER): Payer: Medicare Other | Admitting: *Deleted

## 2011-06-13 ENCOUNTER — Encounter: Payer: Self-pay | Admitting: Nurse Practitioner

## 2011-06-13 ENCOUNTER — Ambulatory Visit (INDEPENDENT_AMBULATORY_CARE_PROVIDER_SITE_OTHER): Payer: Medicare Other | Admitting: Nurse Practitioner

## 2011-06-13 ENCOUNTER — Telehealth: Payer: Self-pay

## 2011-06-13 ENCOUNTER — Encounter: Payer: Self-pay | Admitting: Internal Medicine

## 2011-06-13 VITALS — BP 132/76 | HR 68 | Ht 67.0 in | Wt 123.0 lb

## 2011-06-13 DIAGNOSIS — R011 Cardiac murmur, unspecified: Secondary | ICD-10-CM

## 2011-06-13 DIAGNOSIS — I480 Paroxysmal atrial fibrillation: Secondary | ICD-10-CM

## 2011-06-13 DIAGNOSIS — I495 Sick sinus syndrome: Secondary | ICD-10-CM

## 2011-06-13 DIAGNOSIS — I4891 Unspecified atrial fibrillation: Secondary | ICD-10-CM

## 2011-06-13 LAB — PACEMAKER DEVICE OBSERVATION
AL IMPEDENCE PM: 671 Ohm
AL THRESHOLD: 0.75 V
RV LEAD AMPLITUDE: 11.2 mv
RV LEAD IMPEDENCE PM: 687 Ohm
RV LEAD THRESHOLD: 1.5 V

## 2011-06-13 LAB — BASIC METABOLIC PANEL
BUN: 25 mg/dL — ABNORMAL HIGH (ref 6–23)
CO2: 29 mEq/L (ref 19–32)
Calcium: 9.3 mg/dL (ref 8.4–10.5)
Chloride: 104 mEq/L (ref 96–112)
Creatinine, Ser: 0.9 mg/dL (ref 0.4–1.2)
GFR: 64.06 mL/min (ref >60.00)
Glucose, Bld: 80 mg/dL (ref 70–99)
Potassium: 4.3 mEq/L (ref 3.5–5.1)
Sodium: 141 mEq/L (ref 135–145)

## 2011-06-13 LAB — TSH: TSH: 0.9 u[IU]/mL (ref 0.35–5.50)

## 2011-06-13 MED ORDER — WARFARIN SODIUM 5 MG PO TABS: 5.0000 mg | ORAL_TABLET | Freq: Every day | ORAL | Status: DC

## 2011-06-13 NOTE — Patient Instructions (Addendum)
I would increase the Metoprolol to two times a day as directed by Dr. Swaziland  We are going to start you on Coumadin at 5 mg each evening. You will need a protime in about 5 days.  We will stop your aspirin when your coumadin level is therapeutic. Do not use the Celebrex. This can increase your risk of bleeding  We are going to check your thyroid and potassium today  You are due to see Dr. Swaziland in June. We will schedule that today.  Call the [facility] office at [phone] if you have any questions, problems or concerns.

## 2011-06-13 NOTE — Progress Notes (Signed)
[name] [name] Date of Birth: December 10, 1929 Medical Record #[id number]  History of Present Illness: [name] [name] is seen today for a work in visit. She is seen for Dr. Swaziland. She has a history of PAF, has pacemaker in place, GERD, mild AS and HLD. She comes in today. She is here alone. She had called last week with complaints of having more atrial fib. It had started about 1 week prior when she was in [place] to care for her sick sister. Her episodes lasted approximately 2 to 3 seconds, but had one that lasted for several hours. She was told to increase her metoprolol at that time, but she has yet to do that. She comes back today for follow up. Says she still feels some fluttery in her chest. No caffeine use. Does have some occasional wine. No chest pain. Remains quite active.   Current Outpatient Prescriptions on File Prior to Visit  Medication Sig Dispense Refill  . aspirin 81 MG tablet Take 81 mg by mouth daily.        . calcium carbonate (OS-CAL) 600 MG TABS Take 600 mg by mouth daily.        Marland Kitchen CARTIA XT 240 MG 24 hr capsule TAKE 1 CAPSULE BY MOUTH DAILY  30 capsule  4  . celecoxib (CELEBREX) 200 MG capsule Take 200 mg by mouth as needed.        . Cholecalciferol (VITAMIN D) 1000 UNITS capsule Take 1,000 Units by mouth daily.        Marland Kitchen esomeprazole (NEXIUM) 40 MG capsule Take 40 mg by mouth as needed.        Marland Kitchen levothyroxine (SYNTHROID, LEVOTHROID) 100 MCG tablet Take 100 mcg by mouth daily.        . metoprolol tartrate (LOPRESSOR) 25 MG tablet Take 1 tablet (25 mg total) by mouth 2 (two) times daily.  60 tablet  11  . Multiple Vitamins-Minerals (ICAPS) CAPS Take by mouth 2 (two) times daily.       . multivitamin (THERAGRAN) per tablet Take 1 tablet by mouth daily.          Allergies  Allergen Reactions  . Codeine   . Demerol   . Meperidine Hcl   . Penicillins   . Tetanus Toxoid     Past Medical History  Diagnosis Date  . Hyperlipidemia   . Hypertension   . Sick sinus syndrome      s/p PPM (MDT)  . Syncope   . Osteoarthritis   . Paroxysmal atrial fibrillation   . Hypothyroidism   . Aortic stenosis     mild    Past Surgical History  Procedure Date  . Insert / replace / remove pacemaker 1993    for syptomatic bradycardia and syncope -- in [facility]  . Insert / replace / remove pacemaker 2001    pulse generator replacement by Dr. Alanda Amass   . Insert / replace / remove pacemaker 04/01/2008    PPM Medtronic -- model # [id number] serial # [id number] -- pulse generator replacement by Dr. Reyes Ivan   . Abdominal hysterectomy     History  Smoking status  . Former Smoker  . Quit date: 03/05/1978  Smokeless tobacco  . Never Used    History  Alcohol Use No    Family History  Problem Relation Age of Onset  . Cancer Father     lung cancer  . Alzheimer's disease Mother     Review of Systems: The review of systems is  positive for palpitations.  No recent labs reported. All other systems were reviewed and are negative.  Physical Exam: BP 132/76  Pulse 68  Ht 5\' 7"  (1.702 m)  Wt 123 lb (55.792 kg)  BMI 19.26 kg/m2 Patient is very pleasant and in no acute distress. She is a little anxious.  Skin is warm and dry. Color is normal.  HEENT is unremarkable. Normocephalic/atraumatic. PERRL. Sclera are nonicteric. Neck is supple. No masses. No JVD. Lungs are clear. Cardiac exam shows a regular rate and rhythm. Soft outflow murmur noted. Abdomen is soft. Extremities are without edema. Gait and ROM are intact. No gross neurologic deficits noted.  LABORATORY DATA: EKG today shows atrial pacing and she is in sinus. Pacemaker is checked today. She is having recurrent atrial fib. Longest episode of 12 hours back on March 19th. She has had 233 mode switches for a burden of 0.6%  Labs are pending   Assessment / Plan:

## 2011-06-13 NOTE — Telephone Encounter (Signed)
Patient was called and told lab results.Patient stated she forgot to tell Norma Fredrickson NP this morning she has been having RUQ pain off and on for 3 weeks.Advised to see PCP.

## 2011-06-13 NOTE — Progress Notes (Signed)
Patient presents for device clinic pacemaker check.  Seen as add-on at the request of Talitha Givens, NP  Complain of episode of palpitations a couple of weeks ago.  Device interrogated and found to be functioning normally.  233 mode switch episodes representing 0.6% of total time.  Longest episode 10 hours.  Appears to be atrial flutter.  Lawson Fiscal to have conversation with patient re: anticoagulation.   No changes made today.  See PaceArt report for full details.  Plan ROV with Dr. Johney Frame in 6 months.  Gypsy Balsam, RN, BSN 06/13/2011 10:06 AM

## 2011-06-13 NOTE — Assessment & Plan Note (Signed)
She has mild AS per echo last year. No cardinal symptoms.

## 2011-06-13 NOTE — Assessment & Plan Note (Addendum)
Patient presents with complaint of increasing palpitations. She has been instructed to take the metoprolol BID. We will check the pacemaker today. This shows recurrent atrial fib. She does have an elevated CHADs score. She has been on Coumadin in the remote past and is willing to restart. Coumadin 5 mg is started each evening. Will check a protime in about 5 days. She is to not use her Celebrex. Will plan on stopping her aspirin when her INR is therapeutic. Will plan on seeing her back in June for her regular follow up visit. Will check a TSH and BMET today as well.  Her alcohol may be a contributing factor. Patient is agreeable to this plan and will call if any problems develop in the interim.

## 2011-06-15 ENCOUNTER — Other Ambulatory Visit (HOSPITAL_COMMUNITY): Payer: Self-pay | Admitting: Internal Medicine

## 2011-06-15 DIAGNOSIS — R1011 Right upper quadrant pain: Secondary | ICD-10-CM

## 2011-06-18 ENCOUNTER — Ambulatory Visit (INDEPENDENT_AMBULATORY_CARE_PROVIDER_SITE_OTHER): Payer: Medicare Other | Admitting: *Deleted

## 2011-06-18 DIAGNOSIS — Z7901 Long term (current) use of anticoagulants: Secondary | ICD-10-CM | POA: Insufficient documentation

## 2011-06-18 DIAGNOSIS — I4891 Unspecified atrial fibrillation: Secondary | ICD-10-CM

## 2011-06-18 NOTE — Patient Instructions (Signed)

## 2011-06-20 ENCOUNTER — Encounter (HOSPITAL_COMMUNITY)
Admission: RE | Admit: 2011-06-20 | Discharge: 2011-06-20 | Disposition: A | Discharge status: Home / Self Care | Payer: Medicare Other | Source: Ambulatory Visit | Attending: Internal Medicine | Admitting: Internal Medicine

## 2011-06-20 DIAGNOSIS — R1011 Right upper quadrant pain: Secondary | ICD-10-CM

## 2011-06-20 MED ORDER — TECHNETIUM TC 99M MEBROFENIN IV KIT
5.0000 | PACK | Freq: Once | INTRAVENOUS | Status: AC | PRN
  Administered 2011-06-20: 5 via INTRAVENOUS

## 2011-06-20 MED ORDER — SINCALIDE 5 MCG IJ SOLR
INTRAMUSCULAR | Status: AC
  Administered 2011-06-20: 1.12 ug
  Filled 2011-06-20: qty 5

## 2011-06-25 ENCOUNTER — Ambulatory Visit (INDEPENDENT_AMBULATORY_CARE_PROVIDER_SITE_OTHER): Payer: Medicare Other | Admitting: Pharmacist

## 2011-06-25 ENCOUNTER — Encounter: Payer: Self-pay | Admitting: Internal Medicine

## 2011-06-25 DIAGNOSIS — I4891 Unspecified atrial fibrillation: Secondary | ICD-10-CM

## 2011-06-25 DIAGNOSIS — Z7901 Long term (current) use of anticoagulants: Secondary | ICD-10-CM

## 2011-06-29 ENCOUNTER — Telehealth: Payer: Self-pay | Admitting: Cardiology

## 2011-06-29 NOTE — Telephone Encounter (Signed)
Called spoke with pt, she was started on Doxycycline 100mg  BID x 7 days on 06/28/11.  INR on 4/22 was 2.8, scheduled for recheck on 07/02/11.  Advised pt to continue on abx, can potentially interact with Coumadin and incr INR we will recheck at scheduled OV on 07/02/11.

## 2011-06-29 NOTE — Telephone Encounter (Signed)
New Problem:     Patient called in because she has been placed on an antibiotic and she was wondering how it was going to affect her INR levels. Please call back.

## 2011-07-02 ENCOUNTER — Ambulatory Visit (INDEPENDENT_AMBULATORY_CARE_PROVIDER_SITE_OTHER): Payer: Medicare Other

## 2011-07-02 DIAGNOSIS — Z7901 Long term (current) use of anticoagulants: Secondary | ICD-10-CM

## 2011-07-02 DIAGNOSIS — I4891 Unspecified atrial fibrillation: Secondary | ICD-10-CM

## 2011-07-04 ENCOUNTER — Emergency Department (INDEPENDENT_AMBULATORY_CARE_PROVIDER_SITE_OTHER): Payer: Medicare Other

## 2011-07-04 ENCOUNTER — Telehealth: Payer: Self-pay | Admitting: Cardiology

## 2011-07-04 ENCOUNTER — Emergency Department (HOSPITAL_BASED_OUTPATIENT_CLINIC_OR_DEPARTMENT_OTHER)
Admission: EM | Admit: 2011-07-04 | Discharge: 2011-07-05 | Disposition: A | Discharge status: Home / Self Care | Payer: Medicare Other | Attending: Emergency Medicine | Admitting: Emergency Medicine

## 2011-07-04 ENCOUNTER — Encounter (HOSPITAL_BASED_OUTPATIENT_CLINIC_OR_DEPARTMENT_OTHER): Payer: Self-pay | Admitting: *Deleted

## 2011-07-04 DIAGNOSIS — I10 Essential (primary) hypertension: Secondary | ICD-10-CM | POA: Insufficient documentation

## 2011-07-04 DIAGNOSIS — I4891 Unspecified atrial fibrillation: Secondary | ICD-10-CM | POA: Insufficient documentation

## 2011-07-04 DIAGNOSIS — Z7901 Long term (current) use of anticoagulants: Secondary | ICD-10-CM | POA: Insufficient documentation

## 2011-07-04 DIAGNOSIS — E039 Hypothyroidism, unspecified: Secondary | ICD-10-CM | POA: Insufficient documentation

## 2011-07-04 DIAGNOSIS — Z950 Presence of cardiac pacemaker: Secondary | ICD-10-CM | POA: Insufficient documentation

## 2011-07-04 DIAGNOSIS — M25579 Pain in unspecified ankle and joints of unspecified foot: Secondary | ICD-10-CM

## 2011-07-04 DIAGNOSIS — M25571 Pain in right ankle and joints of right foot: Secondary | ICD-10-CM

## 2011-07-04 DIAGNOSIS — R937 Abnormal findings on diagnostic imaging of other parts of musculoskeletal system: Secondary | ICD-10-CM

## 2011-07-04 DIAGNOSIS — E785 Hyperlipidemia, unspecified: Secondary | ICD-10-CM | POA: Insufficient documentation

## 2011-07-04 NOTE — ED Provider Notes (Signed)
History     CSN: [id number]  Arrival date & time 07/04/11  2152   First MD Initiated Contact with Patient 07/04/11 2320      Chief Complaint  Patient presents with  . Ankle Pain    (Consider location/radiation/quality/duration/timing/severity/associated sxs/prior treatment) HPI This is a 78 year old white female with a history of atrial fibrillation. She was started on Coumadin about 2-1/2 weeks ago. She developed the sudden onset of moderate to severe pain in the right ankle while sitting on a couch this afternoon. This started about 6 PM. The pain is located just posterior to the right lateral malleolus. The pain is worse with palpation or movement. There is no associated swelling, erythema, necrosis, pallor or paresthesia. She denies injury.  Past Medical History  Diagnosis Date  . Hyperlipidemia   . Hypertension   . Sick sinus syndrome     s/p PPM (MDT)  . Syncope   . Osteoarthritis   . Paroxysmal atrial fibrillation   . Hypothyroidism   . Aortic stenosis     mild    Past Surgical History  Procedure Date  . Insert / replace / remove pacemaker 1993    for syptomatic bradycardia and syncope -- in [facility]  . Insert / replace / remove pacemaker 2001    pulse generator replacement by Dr. Alanda Amass   . Insert / replace / remove pacemaker 04/01/2008    PPM Medtronic -- model # [id number] serial # [id number] -- pulse generator replacement by Dr. Reyes Ivan   . Abdominal hysterectomy     Family History  Problem Relation Age of Onset  . Cancer Father     lung cancer  . Alzheimer's disease Mother     History  Substance Use Topics  . Smoking status: Former Smoker    Quit date: 03/05/1978  . Smokeless tobacco: Never Used  . Alcohol Use: No    OB History    Grav Para Term Preterm Abortions TAB SAB Ect Mult Living                  Review of Systems  All other systems reviewed and are negative.    Allergies  Codeine; Demerol; Meperidine hcl; Penicillins; and  Tetanus toxoid  Home Medications   Current Outpatient Rx  Name Route Sig Dispense Refill  . CALCIUM CARBONATE 600 MG PO TABS Oral Take 600 mg by mouth daily.      Marland Kitchen CARTIA XT 240 MG PO CP24  TAKE 1 CAPSULE BY MOUTH DAILY 30 capsule 4  . VITAMIN D 1000 UNITS PO CAPS Oral Take 1,000 Units by mouth daily.      Marland Kitchen LEVOTHYROXINE SODIUM 100 MCG PO TABS Oral Take 100 mcg by mouth daily.      Marland Kitchen METOPROLOL TARTRATE 25 MG PO TABS Oral Take 1 tablet (25 mg total) by mouth 2 (two) times daily. 60 tablet 11  . ICAPS PO CAPS Oral Take by mouth 2 (two) times daily.     . MULTIVITAMINS PO TABS Oral Take 1 tablet by mouth daily.      Marland Kitchen ESOMEPRAZOLE MAGNESIUM 40 MG PO CPDR Oral Take 40 mg by mouth as needed.        BP 127/67  Pulse 62  Temp(Src) 97.8 F (36.6 C) (Oral)  Resp 16  Ht 5\' 6"  (1.676 m)  Wt 120 lb (54.432 kg)  BMI 19.37 kg/m2  SpO2 100%  Physical Exam General: Well-developed, well-nourished female in no acute distress; appearance consistent with age of  record HENT: normocephalic, atraumatic Eyes: Normal appearance Neck: supple Heart: Irregular rhythm Lungs: clear to auscultation bilaterally Abdomen: soft; nondistended Extremities: No deformity; full range of motion; DP and PT pulses normal; tenderness of posterior aspect of right lateral malleolus without deformity, swelling, ecchymosis, erythema, warmth, necrosis, pallor, decreased sensation or decreased capillary refill; no edema Neurologic: Awake, alert and oriented; motor function intact in all extremities and symmetric; no facial droop; sensation intact in ankles and feet bilaterally Skin: Warm and dry Psychiatric: Normal mood and affect    ED Course  Procedures (including critical care time)     MDM   Nursing notes and vitals signs, including pulse oximetry, reviewed.  Summary of this visit's results, reviewed by myself:  Imaging Studies: Dg Ankle Complete Right  07/05/2011  *RADIOLOGY REPORT*  Clinical Data:  Sudden onset right ankle pain.  RIGHT ANKLE - COMPLETE 3+ VIEW  Comparison: None.  Findings: Linear calcific density along the posterior aspect of the lateral malleolus. No soft tissue swelling.  Ankle mortise intact. Tiny joint effusion.  Mild midfoot DJD.  IMPRESSION: Linear density along the posterior margin of the fibula is favored to be projectional artifact rather than an avulsion fracture, particularly given the absence of soft tissue swelling. Correlate with point tenderness.  Original Report Authenticated By: Waneta Martins, M.D.   12:31 AM The location of the abnormality found on the radiograph correlates exactly with the patient's point tenderness. We will treat as an avulsion fracture and the patient will follow up with orthopedist in about a week.        Hanley Seamen, MD 07/05/11 [phone]

## 2011-07-04 NOTE — Telephone Encounter (Signed)
Returned a call to Ms. Annunziato. She was lying on the sofa and when she went to sit up she experienced a pain in her right ankle, although she denies actually injuring it. She initially thought it was arthritis or pulled muscle. She went to choir but had to leave bc it was so painful. She noticed red splotches under the foot and a bruise on the ankle. She was unable to stand on it without significant pain. She was concerned because she was recently started on coumadin for A.Fib and her INR on 07/02/11 was supratherapeutic at 3.6. She did not take her coumadin on the 29th, but did resume it yesterday (4/30). She did not know whether to take it tonight. Due to the bruising and significant pain despite lack of real injury and her recent elevated INR I instructed her to seek evaluation at an urgent care facility or ER. I also told her to wait to take her coumadin tonight until further evaluated. She stated understanding and was appreciative of the help.  Katelinn Justice PA-C 07/04/2011 9:29 PM

## 2011-07-04 NOTE — ED Notes (Signed)
Pt c/o right ankle pain w/o injury. Recently started on coumadin for afib

## 2011-07-05 MED ORDER — HYDROCODONE-ACETAMINOPHEN 5-325 MG PO TABS: 1.0000 | ORAL_TABLET | Freq: Four times a day (QID) | ORAL | Status: AC | PRN

## 2011-07-09 ENCOUNTER — Telehealth: Payer: Self-pay | Admitting: Cardiology

## 2011-07-09 ENCOUNTER — Ambulatory Visit (INDEPENDENT_AMBULATORY_CARE_PROVIDER_SITE_OTHER): Payer: Medicare Other | Admitting: *Deleted

## 2011-07-09 DIAGNOSIS — Z7901 Long term (current) use of anticoagulants: Secondary | ICD-10-CM

## 2011-07-09 DIAGNOSIS — I4891 Unspecified atrial fibrillation: Secondary | ICD-10-CM

## 2011-07-09 NOTE — Telephone Encounter (Signed)
New msg Pt had some questions about her meds. Please call

## 2011-07-09 NOTE — Telephone Encounter (Signed)
Patient called no answer.LMTC. 

## 2011-07-10 NOTE — Telephone Encounter (Signed)
F/U  Patient returning nurse call, she can be reached at [phone] after 12 noon as she works a half a day today.

## 2011-07-10 NOTE — Telephone Encounter (Signed)
Patient called wanting to know if she can change coumadin to xarelto.Patient was told [name] out of office this week,will check with him next week and call her back.

## 2011-07-16 ENCOUNTER — Ambulatory Visit (INDEPENDENT_AMBULATORY_CARE_PROVIDER_SITE_OTHER): Payer: Medicare Other | Admitting: Pharmacist

## 2011-07-16 DIAGNOSIS — Z7901 Long term (current) use of anticoagulants: Secondary | ICD-10-CM

## 2011-07-16 DIAGNOSIS — I4891 Unspecified atrial fibrillation: Secondary | ICD-10-CM

## 2011-07-16 LAB — POCT INR: INR: 2.1

## 2011-07-19 ENCOUNTER — Telehealth: Payer: Self-pay

## 2011-07-19 NOTE — Telephone Encounter (Signed)
Patient called was told spoke to [name] he advised may stop coumadin and next day start xarelto 20 mg daily.Samples left at front desk 3rd floor.Advised to avoid nsaids.

## 2011-07-19 NOTE — Telephone Encounter (Signed)
Per pt report she discussed switching from Coumadin to Xarelto with Dr Swaziland, and pt wishes to proceed with switch.  Per phone note in [facility] on 07/09/11 Dr Swaziland said pt could hold Coumadin x 1 dose, then start taking Xarelto 20mg  once daily then next day.  Advised pt she would need an INR done day prior to starting to confirm INR less than 3.0.  Pt has not taken her Coumadin dosage for today.  Pt states she will hold Coumadin tonight, and will come into office tomorrow for an INR.  Made appt in Coumadin clinic for tomorrow at 1:45pm. Pt aware.

## 2011-07-20 ENCOUNTER — Ambulatory Visit (INDEPENDENT_AMBULATORY_CARE_PROVIDER_SITE_OTHER): Payer: Medicare Other | Admitting: Pharmacist

## 2011-07-20 DIAGNOSIS — I4891 Unspecified atrial fibrillation: Secondary | ICD-10-CM

## 2011-07-20 DIAGNOSIS — Z7901 Long term (current) use of anticoagulants: Secondary | ICD-10-CM

## 2011-07-24 ENCOUNTER — Ambulatory Visit (INDEPENDENT_AMBULATORY_CARE_PROVIDER_SITE_OTHER): Payer: Medicare Other | Admitting: Internal Medicine

## 2011-07-24 ENCOUNTER — Encounter: Payer: Self-pay | Admitting: Internal Medicine

## 2011-07-24 ENCOUNTER — Telehealth: Payer: Self-pay

## 2011-07-24 VITALS — BP 128/70 | HR 80 | Ht 66.0 in | Wt 125.0 lb

## 2011-07-24 DIAGNOSIS — R1031 Right lower quadrant pain: Secondary | ICD-10-CM

## 2011-07-24 NOTE — Progress Notes (Signed)
HISTORY OF PRESENT ILLNESS:  [name] [name] is a 78 y.o. female with hypertension, hyperlipidemia, sick sinus syndrome status post pacemaker placement, paroxysmal atrial fibrillation on Xarelto, hypothyroidism, GERD, and osteoarthritis. She presents today regarding new onset abdominal pain or discomfort. She reports developing right midabdomen discomfort approximately 6-8 weeks ago. This well attending to her sister who had "serious surgery".. The discomfort is described as aching. This has occurred approximately 3 times per week and may last anywhere from 5 minutes to half a day. There has been associated increased intestinal gas. The discomfort does not radiate. It is not affected by meals or defecation. She reports normal appetite and stable weight. She reports normal unchanged bowel habits. No melena or hematochezia. She cannot identify any factors which bring on or exacerbate the pain. She cannot identify any factors that relieved the discomfort once it starts. Her last episode of discomfort was about 3 days ago. Overall, she feels that the frequency and severity has improved since its inception. Review of outside records shows a nuclear medicine hepatobiliary scan ordered 06/20/2011. This was normal. She was last seen in this office in January of 2006 regarding upper abdominal pain. She feels that this is different. At the time of that evaluation, she had a normal abdominal ultrasound as well as screening blood work and upper endoscopy. She does feel that the discomfort is similar to that in 2002 when she was evaluated in this office. At that time, complete colonoscopy was was performed and revealed left-sided diverticulosis. Thereafter, a CT scan of the abdomen and pelvis was performed and found to be unremarkable. No active GERD symptoms at this time. Nexium works well. Outside records from Dr. Timothy Lasso have been requested for review. Her sister is doing better.  REVIEW OF SYSTEMS:  All non-GI ROS  negative except for joint aches  Past Medical History  Diagnosis Date  . Hyperlipidemia   . Hypertension   . Sick sinus syndrome     s/p PPM (MDT)  . Syncope   . Osteoarthritis   . Paroxysmal atrial fibrillation   . Hypothyroidism   . Aortic stenosis     mild    Past Surgical History  Procedure Date  . Insert / replace / remove pacemaker 1993    for syptomatic bradycardia and syncope -- in [facility]  . Insert / replace / remove pacemaker 2001    pulse generator replacement by Dr. Alanda Amass   . Insert / replace / remove pacemaker 04/01/2008    PPM Medtronic -- model # [id number] serial # [id number] -- pulse generator replacement by Dr. Reyes Ivan   . Abdominal hysterectomy     Social History [name] [name]  reports that she quit smoking about 33 years ago. She has never used smokeless tobacco. She reports that she does not drink alcohol or use illicit drugs.  family history includes Alzheimer's disease in her mother and Cancer in her father.  Allergies  Allergen Reactions  . Codeine   . Demerol   . Meperidine Hcl   . Penicillins   . Tetanus Toxoid        PHYSICAL EXAMINATION: Vital signs: BP 128/70  Pulse 80  Ht 5\' 6"  (1.676 m)  Wt 125 lb (56.7 kg)  BMI 20.18 kg/m2  Constitutional: generally well-appearing, no acute distress Psychiatric: alert and oriented x3, cooperative Eyes: extraocular movements intact, anicteric, conjunctiva pink Mouth: oral pharynx moist, no lesions Neck: supple no lymphadenopathy Cardiovascular: heart regular rate and rhythm, no murmur Lungs: clear to  auscultation bilaterally Abdomen: soft, nontender, nondistended, no obvious ascites, no peritoneal signs, normal bowel sounds, no organomegaly Rectal:recent Hemoccults reported as negative. Requested Extremities: no lower extremity edema bilaterally Skin: no lesions on visible extremities Neuro: No focal deficits. No asterixis.     ASSESSMENT:  #1. Right-sided abdominal discomfort as  described. Nonspecific. Negative hepatobiliary imaging. Previous ultrasound, CT scan and endoscopies as described. Suspect musculoskeletal or spasm. Alternatively, could be functional. In any event, further workup warranted #2. GERD. No active symptoms on PPI   PLAN:  #1. Review of outside records from Dr. Timothy Lasso when available #2. Schedule contrast-enhanced CT scan of the abdomen and pelvis to further evaluate right-sided pain #3. Continue Nexium for GERD #4. If CT scan okay, and discomfort continues to improve, that expected management. However, if discomfort persists or worsens, return for further evaluation.

## 2011-07-24 NOTE — Telephone Encounter (Signed)
Left message asking patient to return to lab for labwork before CT

## 2011-07-24 NOTE — Patient Instructions (Signed)
You have been scheduled for a CT scan of the abdomen and pelvis at  [facility] ([address]---this is in the same building as [facility]).   You are scheduled on 07-26-11 at 10:30. You should arrive 15 minutes prior to your appointment time for registration. Please follow the written instructions below on the day of your exam:  WARNING: IF YOU ARE ALLERGIC TO IODINE/X-RAY DYE, PLEASE NOTIFY RADIOLOGY IMMEDIATELY AT [phone]! YOU WILL BE GIVEN A 13 HOUR PREMEDICATION PREP.  1) Do not eat or drink anything after 6:30am (4 hours prior to your test) 2) You have been given 2 bottles of oral contrast to drink. The solution may taste better if refrigerated, but do NOT add ice or any other liquid to this solution. Shake well before drinking.    Drink 1 bottle of contrast @8 :30am (2 hours prior to your exam)  Drink 1 bottle of contrast @ 9:30 (1 hour prior to your exam)  You may take any medications as prescribed with a small amount of water except for the following: Metformin, Glucophage, Glucovance, Avandamet, Riomet, Fortamet, Actoplus Met, Janumet, Glumetza or Metaglip. The above medications must be held the day of the exam AND 48 hours after the exam.  The purpose of you drinking the oral contrast is to aid in the visualization of your intestinal tract. The contrast solution may cause some diarrhea. Before your exam is started, you will be given a small amount of fluid to drink. Depending on your individual set of symptoms, you may also receive an intravenous injection of x-ray contrast/dye. Plan on being at [facility] for 30 minutes or long, depending on the type of exam you are having performed.  If you have any questions regarding your exam or if you need to reschedule, you may call the CT department at [phone] between the hours of 8:00 am and 5:00 pm, Monday-Friday.  ________________________________________________________________________

## 2011-07-25 ENCOUNTER — Telehealth: Payer: Self-pay

## 2011-07-25 ENCOUNTER — Encounter: Payer: Self-pay | Admitting: Cardiology

## 2011-07-25 ENCOUNTER — Other Ambulatory Visit (INDEPENDENT_AMBULATORY_CARE_PROVIDER_SITE_OTHER): Payer: Medicare Other

## 2011-07-25 DIAGNOSIS — R1031 Right lower quadrant pain: Secondary | ICD-10-CM

## 2011-07-25 LAB — BASIC METABOLIC PANEL
BUN: 23 mg/dL (ref 6–23)
Chloride: 103 mEq/L (ref 96–112)
Glucose, Bld: 87 mg/dL (ref 70–99)
Potassium: 4.6 mEq/L (ref 3.5–5.1)

## 2011-07-25 NOTE — Telephone Encounter (Signed)
clarified that pt had gone to lab for bmet before ct scan; pt stated she had gone this morning

## 2011-07-26 ENCOUNTER — Ambulatory Visit (INDEPENDENT_AMBULATORY_CARE_PROVIDER_SITE_OTHER)
Admission: RE | Admit: 2011-07-26 | Discharge: 2011-07-26 | Disposition: A | Discharge status: Home / Self Care | Payer: Medicare Other | Source: Ambulatory Visit | Attending: Internal Medicine | Admitting: Internal Medicine

## 2011-07-26 DIAGNOSIS — R1031 Right lower quadrant pain: Secondary | ICD-10-CM

## 2011-07-26 MED ORDER — IOHEXOL 300 MG/ML  SOLN
100.0000 mL | Freq: Once | INTRAMUSCULAR | Status: AC | PRN
  Administered 2011-07-26: 100 mL via INTRAVENOUS

## 2011-07-31 ENCOUNTER — Other Ambulatory Visit: Payer: Self-pay | Admitting: Cardiology

## 2011-07-31 MED ORDER — RIVAROXABAN 20 MG PO TABS: 1.0000 | ORAL_TABLET | Freq: Every day | ORAL | Status: DC

## 2011-07-31 NOTE — Telephone Encounter (Signed)
Going out of town

## 2011-08-01 ENCOUNTER — Other Ambulatory Visit: Payer: Self-pay | Admitting: Cardiology

## 2011-09-07 ENCOUNTER — Ambulatory Visit (INDEPENDENT_AMBULATORY_CARE_PROVIDER_SITE_OTHER): Payer: Medicare Other | Admitting: Cardiology

## 2011-09-07 ENCOUNTER — Encounter: Payer: Self-pay | Admitting: Cardiology

## 2011-09-07 VITALS — BP 140/80 | HR 66 | Ht 66.0 in | Wt 124.6 lb

## 2011-09-07 DIAGNOSIS — E785 Hyperlipidemia, unspecified: Secondary | ICD-10-CM

## 2011-09-07 DIAGNOSIS — I4891 Unspecified atrial fibrillation: Secondary | ICD-10-CM

## 2011-09-07 DIAGNOSIS — I350 Nonrheumatic aortic (valve) stenosis: Secondary | ICD-10-CM

## 2011-09-07 DIAGNOSIS — I359 Nonrheumatic aortic valve disorder, unspecified: Secondary | ICD-10-CM

## 2011-09-07 DIAGNOSIS — I10 Essential (primary) hypertension: Secondary | ICD-10-CM

## 2011-09-07 MED ORDER — DILTIAZEM HCL ER COATED BEADS 240 MG PO CP24: 240.0000 mg | ORAL_CAPSULE | Freq: Every day | ORAL | Status: DC

## 2011-09-07 MED ORDER — RIVAROXABAN 20 MG PO TABS: 20.0000 mg | ORAL_TABLET | Freq: Every day | ORAL | Status: DC

## 2011-09-07 NOTE — Progress Notes (Signed)
[name] Date of Birth: 30-Sep-1929 Medical Record #[id number]  History of Present Illness: Ms. Peraza is seen today for a followup visit. She has been doing very well from a cardiac standpoint. She denies any recurrent palpitations, dizziness, shortness of breath, or chest pain. She has been experiencing some unusual twinges of discomfort that may occur anywhere in her body. She describes this as a ping or zing with a sudden fleeting sharp discomfort and twitch.   Current Outpatient Prescriptions on File Prior to Visit  Medication Sig Dispense Refill  . calcium carbonate (OS-CAL) 600 MG TABS Take 600 mg by mouth daily.        . Cholecalciferol (VITAMIN D) 1000 UNITS capsule Take 1,000 Units by mouth daily.        Marland Kitchen esomeprazole (NEXIUM) 40 MG capsule Take 40 mg by mouth as needed.       Marland Kitchen levothyroxine (SYNTHROID, LEVOTHROID) 100 MCG tablet Take 100 mcg by mouth daily.        . metoprolol tartrate (LOPRESSOR) 25 MG tablet Take 1 tablet (25 mg total) by mouth 2 (two) times daily.  60 tablet  11  . multivitamin (THERAGRAN) per tablet Take 1 tablet by mouth daily.        Marland Kitchen DISCONTD: CARTIA XT 240 MG 24 hr capsule TAKE 1 CAPSULE BY MOUTH DAILY  30 capsule  4  . DISCONTD: Rivaroxaban (XARELTO) 20 MG TABS Take 1 tablet by mouth daily with supper.  30 tablet  6    Allergies  Allergen Reactions  . Codeine   . Demerol   . Meperidine Hcl   . Penicillins   . Tetanus Toxoid     Past Medical History  Diagnosis Date  . Hyperlipidemia   . Hypertension   . Sick sinus syndrome     s/p PPM (MDT)  . Syncope   . Osteoarthritis   . Paroxysmal atrial fibrillation   . Hypothyroidism   . Aortic stenosis     mild    Past Surgical History  Procedure Date  . Insert / replace / remove pacemaker 1993    for syptomatic bradycardia and syncope -- in [facility]  . Insert / replace / remove pacemaker 2001    pulse generator replacement by Dr. Alanda Amass   . Insert / replace / remove  pacemaker 04/01/2008    PPM Medtronic -- model # ADDRL1 serial # [id number] -- pulse generator replacement by Dr. Reyes Ivan   . Abdominal hysterectomy     History  Smoking status  . Former Smoker  . Quit date: 03/05/1978  Smokeless tobacco  . Never Used    History  Alcohol Use No    Family History  Problem Relation Age of Onset  . Cancer Father     lung cancer  . Alzheimer's disease Mother     Review of Systems: As noted in history of present illness. All other systems were reviewed and are negative.  Physical Exam: BP 140/80  Pulse 66  Ht 5\' 6"  (1.676 m)  Wt 56.518 kg (124 lb 9.6 oz)  BMI 20.11 kg/m2  SpO2 99% Patient is very pleasant and in no acute distress. She is a little anxious.  Skin is warm and dry. Color is normal.  HEENT is unremarkable. Normocephalic/atraumatic. PERRL. Sclera are nonicteric. Neck is supple. No masses. No JVD. Lungs are clear. Cardiac exam shows a regular rate and rhythm. Soft outflow murmur noted. Abdomen is soft. Extremities are without edema. Gait and ROM are  intact. No gross neurologic deficits noted.  LABORATORY DATA: Blood work from 07/26/2011 demonstrates a normal complete chemistry panel. CBC is normal. Total cholesterol 236, triglycerides 72, HDL 92, LDL 1:30, Aprolipoprotein B. of 85. TSH 1.45.  Labs are pending   Assessment / Plan:

## 2011-09-07 NOTE — Assessment & Plan Note (Signed)
Her atrial fibrillation appears to be well controlled at this time. We will continue with metoprolol and diltiazem. She will remain on Xarelto for anticoagulation. I will followup again in 6 months.

## 2011-09-07 NOTE — Assessment & Plan Note (Signed)
Exam is unchanged. Continue to monitor.

## 2011-09-07 NOTE — Assessment & Plan Note (Signed)
Blood pressure is well controlled on current regimen. 

## 2011-09-07 NOTE — Patient Instructions (Signed)
Continue your current medication.  I will see you again in 1 year.  

## 2011-10-24 ENCOUNTER — Other Ambulatory Visit (HOSPITAL_COMMUNITY): Payer: Self-pay | Admitting: *Deleted

## 2011-10-25 ENCOUNTER — Ambulatory Visit (HOSPITAL_COMMUNITY)
Admission: RE | Admit: 2011-10-25 | Discharge: 2011-10-25 | Disposition: A | Discharge status: Home / Self Care | Payer: Medicare Other | Source: Ambulatory Visit | Attending: Internal Medicine | Admitting: Internal Medicine

## 2011-10-25 DIAGNOSIS — M810 Age-related osteoporosis without current pathological fracture: Secondary | ICD-10-CM | POA: Insufficient documentation

## 2011-10-25 MED ORDER — ZOLEDRONIC ACID 5 MG/100ML IV SOLN
INTRAVENOUS | Status: AC
  Filled 2011-10-25: qty 100

## 2011-10-25 MED ORDER — ZOLEDRONIC ACID 5 MG/100ML IV SOLN
5.0000 mg | Freq: Once | INTRAVENOUS | Status: AC
  Administered 2011-10-25: 5 mg via INTRAVENOUS

## 2012-01-01 ENCOUNTER — Encounter: Payer: Self-pay | Admitting: *Deleted

## 2012-01-01 DIAGNOSIS — Z950 Presence of cardiac pacemaker: Secondary | ICD-10-CM | POA: Insufficient documentation

## 2012-01-07 ENCOUNTER — Encounter: Payer: Self-pay | Admitting: Internal Medicine

## 2012-01-07 ENCOUNTER — Ambulatory Visit (INDEPENDENT_AMBULATORY_CARE_PROVIDER_SITE_OTHER): Payer: Medicare Other | Admitting: Internal Medicine

## 2012-01-07 VITALS — BP 124/76 | HR 62 | Ht 65.0 in | Wt 120.0 lb

## 2012-01-07 DIAGNOSIS — I495 Sick sinus syndrome: Secondary | ICD-10-CM

## 2012-01-07 DIAGNOSIS — I4891 Unspecified atrial fibrillation: Secondary | ICD-10-CM

## 2012-01-07 LAB — CBC WITH DIFFERENTIAL/PLATELET
Basophils Relative: 0.5 % (ref 0.0–3.0)
Eosinophils Relative: 0.8 % (ref 0.0–5.0)
Hemoglobin: 13.7 g/dL (ref 12.0–15.0)
Lymphocytes Relative: 20.9 % (ref 12.0–46.0)
Monocytes Relative: 7 % (ref 3.0–12.0)
Neutro Abs: 4.1 10*3/uL (ref 1.4–7.7)
RBC: 4.02 Mil/uL (ref 3.87–5.11)
WBC: 5.8 10*3/uL (ref 4.5–10.5)

## 2012-01-07 LAB — PACEMAKER DEVICE OBSERVATION
AL IMPEDENCE PM: 672 Ohm
AL THRESHOLD: 0.75 V
ATRIAL PACING PM: 68
BAMS-0001: 175 {beats}/min
RV LEAD THRESHOLD: 1.5 V

## 2012-01-07 LAB — BASIC METABOLIC PANEL
CO2: 28 mEq/L (ref 19–32)
Calcium: 9.2 mg/dL (ref 8.4–10.5)
GFR: 65.65 mL/min (ref >60.00)
Sodium: 139 mEq/L (ref 135–145)

## 2012-01-07 NOTE — Progress Notes (Signed)
PCP: Gwen Pounds, MD Primary Cardiologist:  Dr [place]  [name] [name] is a 78 y.o. female who presents today for routine electrophysiology followup.  Since last being seen in our clinic, the patient reports doing very well.  She remains very active.  Today, she denies symptoms of palpitations, chest pain, shortness of breath,  lower extremity edema, dizziness, presyncope, or syncope.  The patient is otherwise without complaint today.   Past Medical History  Diagnosis Date  . Hyperlipidemia   . Hypertension   . Sick sinus syndrome     s/p PPM (MDT)  . Syncope   . Osteoarthritis   . Paroxysmal atrial fibrillation   . Hypothyroidism   . Aortic stenosis     mild   Past Surgical History  Procedure Date  . Insert / replace / remove pacemaker 1993    for syptomatic bradycardia and syncope -- in [facility]  . Insert / replace / remove pacemaker 2001    pulse generator replacement by Dr. Alanda Amass   . Insert / replace / remove pacemaker 04/01/2008    PPM Medtronic -- model # ADDRL1 serial # [id number] -- pulse generator replacement by Dr. Reyes Ivan   . Abdominal hysterectomy     Current Outpatient Prescriptions  Medication Sig Dispense Refill  . calcium carbonate (OS-CAL) 600 MG TABS Take 600 mg by mouth daily.        . Cholecalciferol (VITAMIN D) 1000 UNITS capsule Take 1,000 Units by mouth daily.        Marland Kitchen diltiazem (CARTIA XT) 240 MG 24 hr capsule Take 1 capsule (240 mg total) by mouth daily.  90 capsule  4  . esomeprazole (NEXIUM) 40 MG capsule Take 40 mg by mouth as needed.       Marland Kitchen levothyroxine (SYNTHROID, LEVOTHROID) 100 MCG tablet Take 100 mcg by mouth daily.        . metoprolol tartrate (LOPRESSOR) 25 MG tablet Take 1 tablet (25 mg total) by mouth 2 (two) times daily.  60 tablet  11  . Rivaroxaban (XARELTO) 20 MG TABS Take 1 tablet (20 mg total) by mouth daily with supper.  90 tablet  3    Physical Exam: Filed Vitals:   01/07/12 0930  BP: 124/76  Pulse: 62  Height: 5\' 5"   (1.651 m)  Weight: 120 lb (54.432 kg)  SpO2: 98%    GEN- The patient is well appearing, alert and oriented x 3 today.   Head- normocephalic, atraumatic Eyes-  Sclera clear, conjunctiva pink Ears- hearing intact Oropharynx- clear Lungs- Clear to ausculation bilaterally, normal work of breathing Chest- pacemaker pocket is well healed Heart- Regular rate and rhythm, 2/6 SEM LUSB (mid peaking) GI- soft, NT, ND, + BS Extremities- no clubbing, cyanosis, or edema  Pacemaker interrogation- reviewed in detail today,  See PACEART report  Assessment and Plan: BRADYCARDIA-TACHYCARDIA SYNDROME   Normal pacemaker function  See Pace Art report  No changes today  Return to the device clinic in 6 months  ATRIAL FIBRILLATION  no afib in the last 12 months  Continue xarelto Will check CBC and BMET today Will enroll in the anticoagulation clinic to see Kennon Rounds in 4 weeks

## 2012-01-07 NOTE — Patient Instructions (Addendum)
Your physician recommends that you schedule a follow-up appointment in: 4 weeks with [name] [name], Pharm D in anti-coag clinic( on Xarelto)   Your physician recommends that you return for lab work today      Your physician wants you to follow-up in: 6 months with device clinic and 12 months with [name] [name] [name] will receive a reminder letter in the mail two months in advance. If you don't receive a letter, please call our office to schedule the follow-up appointment.

## 2012-01-23 ENCOUNTER — Telehealth: Payer: Self-pay | Admitting: Cardiology

## 2012-01-23 NOTE — Telephone Encounter (Signed)
Patient called no answer.Left message on personal voice mail spoke to [name] does not need to hold xarelto for dental work.

## 2012-01-23 NOTE — Telephone Encounter (Signed)
plz return call to pt [phone] regarding Xarelto hold and dentist appnt.

## 2012-02-04 ENCOUNTER — Ambulatory Visit (INDEPENDENT_AMBULATORY_CARE_PROVIDER_SITE_OTHER): Payer: Medicare Other | Admitting: *Deleted

## 2012-02-04 DIAGNOSIS — I4891 Unspecified atrial fibrillation: Secondary | ICD-10-CM

## 2012-02-04 NOTE — Patient Instructions (Addendum)
Pt was started on Xarelto 20mg  daily  for AF on 09/07/2011  Reviewed patients medication list.  Pt is not currently on any combined P-gp and strong CYP3A4 inhibitors/inducers (ketoconazole, traconazole, ritonavir, carbamazepine, phenytoin, rifampin, St. John's wort).  Reviewed labs.  SCr 0.9, Weight 54.432 kg, CrCl- 50.4.  Dose is appropriate based on CrCl. Hgb and HCT 13.7 and 41.6, pt has been instructed to notify any clinical person especially for a procedure to notify them she is on xarelto, will need to hold 24 hours prior to a procedure.   A full discussion of the nature of anticoagulants has been carried out.  A benefit/risk analysis has been presented to the patient, so that they understand the justification for choosing anticoagulation with Xarelto at this time.  The need for compliance is stressed.  Pt is aware to take the medication once daily with the largest meal of the day.  Side effects of potential bleeding are discussed, including unusual colored urine or stools, coughing up blood or coffee ground emesis, nose bleeds or serious fall or head trauma.  Discussed signs and symptoms of stroke. The patient should avoid any OTC items containing aspirin or ibuprofen.  Avoid alcohol consumption.   Call if any signs of abnormal bleeding.  Discussed financial obligations and resolved any difficulty in obtaining medication.  Next lab test test in 6 months.   02/13/2012 pt  is going to have a dental extraction of 1 tooth, Dr Gwendlyn Deutscher, per Dr. Sw[place]il[place] patient does not need to hold xarelto for extraction. (see note)

## 2012-02-05 ENCOUNTER — Ambulatory Visit: Payer: Medicare Other | Admitting: Pharmacist

## 2012-02-11 ENCOUNTER — Telehealth: Payer: Self-pay | Admitting: Cardiology

## 2012-02-11 NOTE — Telephone Encounter (Signed)
Spoke to patient she stated she was having a tooth pulled and wanted to verify about taking xarelto.Patient was told [name] advised she does not have to hold xarelto.

## 2012-02-11 NOTE — Telephone Encounter (Signed)
plz return call to pt [phone] regarding upcoming dental appnt 12/13 and medication holds.

## 2012-03-03 ENCOUNTER — Telehealth: Payer: Self-pay | Admitting: Cardiology

## 2012-03-03 NOTE — Telephone Encounter (Signed)
New problem:    Discuss situation that happen to her over the christmas holidays.

## 2012-03-03 NOTE — Telephone Encounter (Signed)
Patient called stated her heart went out of rhythm day 02/28/12.States it lasted about 1 hour,and went back in rhythm.States she wanted [name] to know.[name] out of office this week will let him know next week.Patient advised to continue same medications and call back if needed.

## 2012-03-31 ENCOUNTER — Telehealth: Payer: Self-pay | Admitting: Cardiology

## 2012-03-31 NOTE — Telephone Encounter (Signed)
Pt calling re being on xarelto, has new insurance , will be sending Berkley Harvey form to fill out

## 2012-03-31 NOTE — Telephone Encounter (Signed)
Patient called was told [facility] called [phone] Xarelto approved for 1 year.Patient's ID # [id number].Reference # [id number].[facility] at [place] also called and told of approval.

## 2012-06-11 ENCOUNTER — Other Ambulatory Visit: Payer: Self-pay

## 2012-06-11 ENCOUNTER — Telehealth: Payer: Self-pay | Admitting: Cardiology

## 2012-06-11 ENCOUNTER — Telehealth: Payer: Self-pay

## 2012-06-11 MED ORDER — METOPROLOL TARTRATE 25 MG PO TABS: 25.0000 mg | ORAL_TABLET | Freq: Two times a day (BID) | ORAL | Status: DC

## 2012-06-11 NOTE — Telephone Encounter (Signed)
Follow Up    Pt called in this morning returning your call from today. Please call.

## 2012-06-11 NOTE — Telephone Encounter (Signed)
Received a refill request from [facility] [address] for Metoprolol Tartrate 25MG  BID and I did not see this medication in the pt chart on current medication list. Called and left a message for pt to return call. Just need to verify if she is currently taking medication or if she has stopped taking it and remind her to schedule a follow up office visit.

## 2012-06-11 NOTE — Telephone Encounter (Signed)
Follow up   Pt stated someone called her and left a vm but didn't leave their name.

## 2012-06-11 NOTE — Telephone Encounter (Signed)
Returned call to patient was told we received a refill request for metoprolol tartrate 25 mg and medication not listed in chart.Patient stated she has been taking for a long time 25 mg twice a day.Refill sent to pharmacy.

## 2012-07-08 ENCOUNTER — Encounter: Payer: Self-pay | Admitting: Pharmacist

## 2012-07-09 ENCOUNTER — Ambulatory Visit (INDEPENDENT_AMBULATORY_CARE_PROVIDER_SITE_OTHER): Payer: Medicare Other | Admitting: *Deleted

## 2012-07-09 ENCOUNTER — Other Ambulatory Visit: Payer: Self-pay | Admitting: Internal Medicine

## 2012-07-09 ENCOUNTER — Encounter: Payer: Self-pay | Admitting: Internal Medicine

## 2012-07-09 DIAGNOSIS — I4891 Unspecified atrial fibrillation: Secondary | ICD-10-CM

## 2012-07-09 LAB — PACEMAKER DEVICE OBSERVATION
AL IMPEDENCE PM: 672 Ohm
BAMS-0001: 175 {beats}/min
VENTRICULAR PACING PM: 1

## 2012-07-09 NOTE — Progress Notes (Signed)
Pacemaker check in clinic. Normal device function. ROV in 6 months with JA.

## 2012-07-11 ENCOUNTER — Telehealth: Payer: Self-pay | Admitting: Cardiology

## 2012-07-11 NOTE — Telephone Encounter (Signed)
Follow up    Pt returning a call from nurse.

## 2012-07-11 NOTE — Telephone Encounter (Signed)
New Prob     Pt has some questions regarding her XARELTO prescription. Would like to speak to nurse.

## 2012-07-11 NOTE — Telephone Encounter (Signed)
Patient rec'd letter to schedule appt with [facility] for yearly follow-up on her Xarelto medication. Advised patient that scheduling will call her to schedule this appt.

## 2012-07-17 NOTE — Telephone Encounter (Signed)
Appointment scheduled for 08/15/12 @ 8:30

## 2012-08-15 ENCOUNTER — Ambulatory Visit (INDEPENDENT_AMBULATORY_CARE_PROVIDER_SITE_OTHER): Payer: Medicare Other

## 2012-08-15 ENCOUNTER — Encounter: Payer: Self-pay | Admitting: Internal Medicine

## 2012-08-15 DIAGNOSIS — I4891 Unspecified atrial fibrillation: Secondary | ICD-10-CM

## 2012-08-15 NOTE — Patient Instructions (Addendum)

## 2012-08-15 NOTE — Progress Notes (Signed)
Pt was started on Xarelto 20mg  once daily for atrial fibrillation on 09/07/11 by Dr Johney Frame.  Reviewed patients medication list.  Pt is not currently on any combined P-gp and strong CYP3A4 inhibitors/inducers (ketoconazole, traconazole, ritonavir, carbamazepine, phenytoin, rifampin, St. John's wort).  Reviewed labs.  SCr-1.0 , ZOXWRU-04.54 , CrCl-38.55.  Dose is not appropriate based on CrCl.   Hgb and HCT 14.2/42.9 WNL on 08/18/12.  Pt was seen in Dr Ferd Hibbs office, labwork CBC and CMET drawn today.  Pt will have results forwarded to [place] by [facility] Dr Ferd Hibbs nurse.  Will await lab results.   Results received and reviewed with pt.  Advised to change Xarelto dosage from 20mg  QD to 15mg  QD.  New rx sent to pharmacy pt aware.  F/U labs in 6 months.

## 2012-08-19 MED ORDER — RIVAROXABAN 15 MG PO TABS: 15.0000 mg | ORAL_TABLET | Freq: Every day | ORAL | Status: DC

## 2012-08-27 LAB — IFOBT (OCCULT BLOOD): IFOBT: NEGATIVE

## 2012-08-28 ENCOUNTER — Encounter (INDEPENDENT_AMBULATORY_CARE_PROVIDER_SITE_OTHER): Payer: Medicare Other | Admitting: *Deleted

## 2012-08-28 DIAGNOSIS — I739 Peripheral vascular disease, unspecified: Secondary | ICD-10-CM

## 2012-09-02 ENCOUNTER — Encounter: Payer: Self-pay | Admitting: Internal Medicine

## 2012-09-10 ENCOUNTER — Encounter: Payer: Self-pay | Admitting: Cardiology

## 2012-09-10 ENCOUNTER — Ambulatory Visit (INDEPENDENT_AMBULATORY_CARE_PROVIDER_SITE_OTHER): Payer: Medicare Other | Admitting: Cardiology

## 2012-09-10 VITALS — BP 150/84 | HR 61 | Ht 66.0 in | Wt 124.0 lb

## 2012-09-10 DIAGNOSIS — E785 Hyperlipidemia, unspecified: Secondary | ICD-10-CM

## 2012-09-10 DIAGNOSIS — I4891 Unspecified atrial fibrillation: Secondary | ICD-10-CM

## 2012-09-10 DIAGNOSIS — Z950 Presence of cardiac pacemaker: Secondary | ICD-10-CM

## 2012-09-10 DIAGNOSIS — I10 Essential (primary) hypertension: Secondary | ICD-10-CM

## 2012-09-10 NOTE — Patient Instructions (Signed)
Continue your current therapy  I will see you again in 6 months.   

## 2012-09-10 NOTE — Progress Notes (Signed)
[name] Date of Birth: [birth year] Medical Record #[id number]  History of Present Illness: [name] [name] is seen today for a yearly followup visit. She has been doing very well from a cardiac standpoint. She denies any  dizziness, shortness of breath, or chest pain. She has had very infrequent episodes of palpitations. She reports one episode that occurred around Christmas that lasted several hours. She had another episode this past June. Her pacemaker check in may confirmed an episode of atrial fibrillation in December that lasted about 9 hours. Her ventricular rate was well controlled. Her arrhythmia burden is very low at 0.2%. Her anticoagulation dose was recently adjusted for her renal function.  Current Outpatient Prescriptions on File Prior to Visit  Medication Sig Dispense Refill  . calcium carbonate (OS-CAL) 600 MG TABS Take 600 mg by mouth daily.        . Cholecalciferol (VITAMIN D) 1000 UNITS capsule Take 1,000 Units by mouth daily.        Marland Kitchen diltiazem (CARTIA XT) 240 MG 24 hr capsule Take 1 capsule (240 mg total) by mouth daily.  90 capsule  4  . esomeprazole (NEXIUM) 40 MG capsule Take 40 mg by mouth as needed.       Marland Kitchen levothyroxine (SYNTHROID, LEVOTHROID) 100 MCG tablet Take 100 mcg by mouth daily.        . metoprolol tartrate (LOPRESSOR) 25 MG tablet Take 1 tablet (25 mg total) by mouth 2 (two) times daily.  180 tablet  3  . Rivaroxaban (XARELTO) 15 MG TABS tablet Take 1 tablet (15 mg total) by mouth daily.  30 tablet  6   No current facility-administered medications on file prior to visit.    Allergies  Allergen Reactions  . Codeine   . Demerol   . Meperidine Hcl   . Penicillins   . Tetanus Toxoid     Past Medical History  Diagnosis Date  . Hyperlipidemia   . Hypertension   . Sick sinus syndrome     s/p PPM (MDT)  . Syncope   . Osteoarthritis   . Paroxysmal atrial fibrillation   . Hypothyroidism   . Aortic stenosis     mild    Past Surgical History   Procedure Laterality Date  . Insert / replace / remove pacemaker  1993    for syptomatic bradycardia and syncope -- in [facility]  . Insert / replace / remove pacemaker  2001    pulse generator replacement by Dr. Alanda Amass   . Insert / replace / remove pacemaker  04/01/2008    PPM Medtronic -- model # [id number] serial # [id number] -- pulse generator replacement by Dr. Reyes Ivan   . Abdominal hysterectomy      History  Smoking status  . Former Smoker  . Quit date: 03/05/1978  Smokeless tobacco  . Never Used    History  Alcohol Use No    Family History  Problem Relation Age of Onset  . Cancer Father     lung cancer  . Alzheimer's disease Mother     Review of Systems: As noted in history of present illness. All other systems were reviewed and are negative.  Physical Exam: BP 150/84  Pulse 61  Ht 5\' 6"  (1.676 m)  Wt 124 lb (56.246 kg)  BMI 20.02 kg/m2 Patient is very pleasant and in no acute distress.  Skin is warm and dry. Color is normal.  HEENT is unremarkable. Normocephalic/atraumatic. PERRL. Sclera are nonicteric. Neck is supple. No masses.  No JVD. Lungs are clear. Cardiac exam shows a regular rate and rhythm. Soft outflow murmur noted. Abdomen is soft. Extremities are without edema. Gait and ROM are intact. No gross neurologic deficits noted.  LABORATORY DATA: ECG today demonstrates an atrially paced rhythm at a rate of 61 beats per minute. It is otherwise normal.  Assessment / Plan: 1. Paroxysmal atrial fibrillation with sick sinus syndrome. Status post pacemaker implant. Very infrequent episodes of atrial fibrillation with adequate rate control. Continue long-term anticoagulation. I'll see her back again in one year.

## 2012-09-18 ENCOUNTER — Encounter (HOSPITAL_BASED_OUTPATIENT_CLINIC_OR_DEPARTMENT_OTHER): Payer: Self-pay | Admitting: *Deleted

## 2012-09-18 ENCOUNTER — Emergency Department (HOSPITAL_BASED_OUTPATIENT_CLINIC_OR_DEPARTMENT_OTHER): Payer: Medicare Other

## 2012-09-18 ENCOUNTER — Emergency Department (HOSPITAL_BASED_OUTPATIENT_CLINIC_OR_DEPARTMENT_OTHER)
Admission: EM | Admit: 2012-09-18 | Discharge: 2012-09-18 | Disposition: A | Discharge status: Home / Self Care | Payer: Medicare Other | Attending: Emergency Medicine | Admitting: Emergency Medicine

## 2012-09-18 DIAGNOSIS — I4891 Unspecified atrial fibrillation: Secondary | ICD-10-CM | POA: Insufficient documentation

## 2012-09-18 DIAGNOSIS — Z862 Personal history of diseases of the blood and blood-forming organs and certain disorders involving the immune mechanism: Secondary | ICD-10-CM | POA: Insufficient documentation

## 2012-09-18 DIAGNOSIS — IMO0002 Reserved for concepts with insufficient information to code with codable children: Secondary | ICD-10-CM | POA: Insufficient documentation

## 2012-09-18 DIAGNOSIS — Z8639 Personal history of other endocrine, nutritional and metabolic disease: Secondary | ICD-10-CM | POA: Insufficient documentation

## 2012-09-18 DIAGNOSIS — Z880 Allergy status to penicillin: Secondary | ICD-10-CM | POA: Insufficient documentation

## 2012-09-18 DIAGNOSIS — I10 Essential (primary) hypertension: Secondary | ICD-10-CM | POA: Insufficient documentation

## 2012-09-18 DIAGNOSIS — Z79899 Other long term (current) drug therapy: Secondary | ICD-10-CM | POA: Insufficient documentation

## 2012-09-18 DIAGNOSIS — E039 Hypothyroidism, unspecified: Secondary | ICD-10-CM | POA: Insufficient documentation

## 2012-09-18 DIAGNOSIS — Z87891 Personal history of nicotine dependence: Secondary | ICD-10-CM | POA: Insufficient documentation

## 2012-09-18 DIAGNOSIS — Z8669 Personal history of other diseases of the nervous system and sense organs: Secondary | ICD-10-CM | POA: Insufficient documentation

## 2012-09-18 DIAGNOSIS — I359 Nonrheumatic aortic valve disorder, unspecified: Secondary | ICD-10-CM | POA: Insufficient documentation

## 2012-09-18 DIAGNOSIS — M1990 Unspecified osteoarthritis, unspecified site: Secondary | ICD-10-CM | POA: Insufficient documentation

## 2012-09-18 DIAGNOSIS — I495 Sick sinus syndrome: Secondary | ICD-10-CM | POA: Insufficient documentation

## 2012-09-18 DIAGNOSIS — Z8709 Personal history of other diseases of the respiratory system: Secondary | ICD-10-CM | POA: Insufficient documentation

## 2012-09-18 DIAGNOSIS — R042 Hemoptysis: Secondary | ICD-10-CM | POA: Insufficient documentation

## 2012-09-18 DIAGNOSIS — Z950 Presence of cardiac pacemaker: Secondary | ICD-10-CM | POA: Insufficient documentation

## 2012-09-18 LAB — BASIC METABOLIC PANEL
GFR calc Af Amer: 48 mL/min — ABNORMAL LOW (ref >90)
GFR calc non Af Amer: 41 mL/min — ABNORMAL LOW (ref >90)
Potassium: 4.2 mEq/L (ref 3.5–5.1)
Sodium: 140 mEq/L (ref 135–145)

## 2012-09-18 LAB — CBC WITH DIFFERENTIAL/PLATELET
Basophils Relative: 0 % (ref 0–1)
Hemoglobin: 13.5 g/dL (ref 12.0–15.0)
Lymphs Abs: 1.3 10*3/uL (ref 0.7–4.0)
MCHC: 33.8 g/dL (ref 30.0–36.0)
Monocytes Relative: 9 % (ref 3–12)
Neutro Abs: 3.2 10*3/uL (ref 1.7–7.7)
Neutrophils Relative %: 64 % (ref 43–77)
RBC: 3.92 MIL/uL (ref 3.87–5.11)

## 2012-09-18 MED ORDER — IOHEXOL 350 MG/ML SOLN
64.0000 mL | Freq: Once | INTRAVENOUS | Status: AC | PRN
  Administered 2012-09-18: 64 mL via INTRAVENOUS

## 2012-09-18 NOTE — ED Notes (Signed)
Pt returned from CT °

## 2012-09-18 NOTE — ED Notes (Signed)
Patient transported to CT 

## 2012-09-18 NOTE — ED Notes (Signed)
Pt. Reports she was on a Z pak 2 wks ago for a sinus infection.  Pt. Reports no blood when blowing nose.  No nausea or vomiting and no resp. Distress noted.

## 2012-09-18 NOTE — ED Provider Notes (Signed)
History    CSN: [id number] Arrival date & time 09/18/12  1826  First MD Initiated Contact with Patient 09/18/12 1918     Chief Complaint  Patient presents with  . Hemoptysis   (Consider location/radiation/quality/duration/timing/severity/associated sxs/prior Treatment) HPI  complains of coughing up blood onset immediately prior to coming here last approximately 15 minutes, soaking several Kleenex's. She presently complains of mild discomfort  in her throat and a feeling of the need to clear her throat. Denies chest pain denies shortness of breath denies other complaint. Recently treated for sinus infection with Zithromax with much improvement. Hemoptysis has stopped spontaneously after 15 minutes. Past Medical History  Diagnosis Date  . Hyperlipidemia   . Hypertension   . Sick sinus syndrome     s/p PPM (MDT)  . Syncope   . Osteoarthritis   . Paroxysmal atrial fibrillation   . Hypothyroidism   . Aortic stenosis     mild   Past Surgical History  Procedure Laterality Date  . Insert / replace / remove pacemaker  1993    for syptomatic bradycardia and syncope -- in [facility]  . Insert / replace / remove pacemaker  2001    pulse generator replacement by Dr. Alanda Amass   . Insert / replace / remove pacemaker  04/01/2008    PPM Medtronic -- model # [id number] serial # [id number] -- pulse generator replacement by Dr. Reyes Ivan   . Abdominal hysterectomy     Family History  Problem Relation Age of Onset  . Cancer Father     lung cancer  . Alzheimer's disease Mother    History  Substance Use Topics  . Smoking status: Former Smoker    Quit date: 03/05/1978  . Smokeless tobacco: Never Used  . Alcohol Use: No   OB History   Grav Para Term Preterm Abortions TAB SAB Ect Mult Living                 Review of Systems  Constitutional: Negative.   HENT: Negative.   Respiratory:       Hemoptysis  Cardiovascular: Negative.   Gastrointestinal: Negative.   Musculoskeletal:  Negative.   Skin: Negative.   Neurological: Negative.   Psychiatric/Behavioral: Negative.   All other systems reviewed and are negative.    Allergies  Codeine; Demerol; Meperidine hcl; Penicillins; and Tetanus toxoid  Home Medications   Current Outpatient Rx  Name  Route  Sig  Dispense  Refill  . calcium carbonate (OS-CAL) 600 MG TABS   Oral   Take 600 mg by mouth daily.           . Cholecalciferol (VITAMIN D) 1000 UNITS capsule   Oral   Take 1,000 Units by mouth daily.           Marland Kitchen diltiazem (CARTIA XT) 240 MG 24 hr capsule   Oral   Take 1 capsule (240 mg total) by mouth daily.   90 capsule   4   . esomeprazole (NEXIUM) 40 MG capsule   Oral   Take 40 mg by mouth as needed.          . fluticasone (FLONASE) 50 MCG/ACT nasal spray   Nasal   Place 2 sprays into the nose as needed.          Marland Kitchen levothyroxine (SYNTHROID, LEVOTHROID) 100 MCG tablet   Oral   Take 100 mcg by mouth daily.           . metoprolol tartrate (LOPRESSOR) 25 MG  tablet   Oral   Take 1 tablet (25 mg total) by mouth 2 (two) times daily.   180 tablet   3   . Rivaroxaban (XARELTO) 15 MG TABS tablet   Oral   Take 1 tablet (15 mg total) by mouth daily.   30 tablet   6    BP 146/65  Pulse 70  Temp(Src) 98 F (36.7 C) (Oral)  Resp 22  Ht 5\' 6"  (1.676 m)  Wt 124 lb (56.246 kg)  BMI 20.02 kg/m2  SpO2 95% Physical Exam  Nursing note and vitals reviewed. Constitutional: She appears well-developed and well-nourished. No distress.  HENT:  Head: Normocephalic and atraumatic.  Mouth/Throat: Oropharynx is clear and moist. No oropharyngeal exudate.  Eyes: Conjunctivae are normal. Pupils are equal, round, and reactive to light.  Neck: Neck supple. No tracheal deviation present. No thyromegaly present.  Cardiovascular: Regular rhythm.   No murmur heard. Irregularly irregular  Pulmonary/Chest: Effort normal and breath sounds normal.  Abdominal: Soft. Bowel sounds are normal. She exhibits  no distension. There is no tenderness.  Musculoskeletal: Normal range of motion. She exhibits no edema and no tenderness.  Neurological: She is alert. Coordination normal.  Skin: Skin is warm and dry. No rash noted.  Psychiatric: She has a normal mood and affect.    ED Course  Procedures (including critical care time) Labs Reviewed - No data to display No results found. No diagnosis found. 11:05 PM patient resting comfortably. No distress. No further episodes of hemoptysis. Results for orders placed during the hospital encounter of 09/18/12  D-DIMER, QUANTITATIVE      Result Value Range   D-Dimer, Quant 0.53 (*) 0.00 - 0.48 ug/mL-FEU  BASIC METABOLIC PANEL      Result Value Range   Sodium 140  135 - 145 mEq/L   Potassium 4.2  3.5 - 5.1 mEq/L   Chloride 102  96 - 112 mEq/L   CO2 23  19 - 32 mEq/L   Glucose, Bld 111 (*) 70 - 99 mg/dL   BUN 23  6 - 23 mg/dL   Creatinine, Ser 1.61 (*) 0.50 - 1.10 mg/dL   Calcium 9.8  8.4 - 09.6 mg/dL   GFR calc non Af Amer 41 (*) >90 mL/min   GFR calc Af Amer 48 (*) >90 mL/min  CBC WITH DIFFERENTIAL      Result Value Range   WBC 5.0  4.0 - 10.5 K/uL   RBC 3.92  3.87 - 5.11 MIL/uL   Hemoglobin 13.5  12.0 - 15.0 g/dL   HCT 04.5  40.9 - 81.1 %   MCV 102.0 (*) 78.0 - 100.0 fL   MCH 34.4 (*) 26.0 - 34.0 pg   MCHC 33.8  30.0 - 36.0 g/dL   RDW 91.4  78.2 - 95.6 %   Platelets 233  150 - 400 K/uL   Neutrophils Relative % 64  43 - 77 %   Neutro Abs 3.2  1.7 - 7.7 K/uL   Lymphocytes Relative 26  12 - 46 %   Lymphs Abs 1.3  0.7 - 4.0 K/uL   Monocytes Relative 9  3 - 12 %   Monocytes Absolute 0.4  0.1 - 1.0 K/uL   Eosinophils Relative 2  0 - 5 %   Eosinophils Absolute 0.1  0.0 - 0.7 K/uL   Basophils Relative 0  0 - 1 %   Basophils Absolute 0.0  0.0 - 0.1 K/uL   Ct Soft Tissue Neck W Contrast  09/18/2012   *RADIOLOGY REPORT*  Clinical Data: Mass at the anterior bottom of neck, marked with a vitamin E capsule.  CT NECK WITH CONTRAST  Technique:   Multidetector CT imaging of the neck was performed with intravenous contrast.  Contrast:  64 ml Omnipaque 350  Comparison: None.  Findings:  At the site of a vitamin E marker, there is no diagnostic abnormality, only normal sternocleidomastoid, internal jugular vein, and strap musculature.  No evidence of mass along the surfaces of the aerodigestive tract. Salivary glands are symmetric and unremarkable.  Thyroid gland is diffusely small.  Cervical carotid atherosclerosis without evidence of high-grade stenosis.  There are right subclavian approach pacer wires, incompletely imaged.  No suspicious lymph node enlargement.  No acute osseous abnormality.  C5-6 and C6-7 degenerative disc narrowing.  IMPRESSION: Negative neck CT.  No mass in the region of the right lower neck.   Original Report Authenticated By: Tiburcio Pea   Ct Angio Chest Pe W/cm &/or Wo Cm  09/18/2012   *RADIOLOGY REPORT*  Clinical Data: Hemoptysis; elevated D-dimer.  Fullness at the base of the neck.  CT ANGIOGRAPHY CHEST  Technique:  Multidetector CT imaging of the chest using the standard protocol during bolus administration of intravenous contrast. Multiplanar reconstructed images including MIPs were obtained and reviewed to evaluate the vascular anatomy.  Contrast: 64mL OMNIPAQUE IOHEXOL 350 MG/ML SOLN  Comparison: None.  Findings: There is no evidence of pulmonary embolus.  Scattered small nodular opacities are noted predominately within the right lung, raising suspicion for an atypical infectious process.  The appearance would be unusual for malignancy or an inflammatory process.  Scattered atelectasis is noted within the left lung, and mild atelectasis is seen at the right lung base.  There is no evidence of significant focal consolidation, pleural effusion or pneumothorax.  Diffuse coronary artery calcifications are seen.  Calcification is noted along the thoracic aorta.  The great vessels are grossly unremarkable in appearance, aside  from scattered atherosclerotic disease.  No pericardial effusion is identified.  No mediastinal lymphadenopathy is seen.  A pacemaker is seen at the right chest wall, with leads ending at the right atrium and right ventricle.  No axillary lymphadenopathy is seen.  The visualized portions of the diminutive thyroid gland are unremarkable in appearance.  The visualized portions of the liver and spleen are unremarkable.  No acute osseous abnormalities are seen.  IMPRESSION:  1.  No evidence of pulmonary embolus. 2.  Scattered small nodular opacities noted predominately within the right lung, suspicious for an atypical infectious process.  The appearance would be unusual for malignancy or an inflammatory process; suggest clinical correlation. 3.  Scattered bilateral atelectasis noted. 4.  Diffuse coronary artery calcifications seen.   Original Report Authenticated By: Tonia Ghent, M.D.    MDM  Adult the patient has an acute infectious process occurring in her lungs. Her cough is improving steadily with time after treatment with Zithromax by Dr.Russo Plan home observation. If the hemoptysis continues she should discuss with Drs. [place] or Timothy Lasso need for continuance of Xarelto, and possible referral to ENT specialist Diagnosis hemoptysis   Doug Sou, MD 09/18/12 2312

## 2012-09-18 NOTE — ED Notes (Signed)
Coughing bright red blood after dinner tonight. Recent sinus infection.

## 2012-09-24 ENCOUNTER — Telehealth: Payer: Self-pay | Admitting: Cardiology

## 2012-09-24 NOTE — Telephone Encounter (Signed)
New Prob     Would like to speak to nurse regarding an incident that happened this past weekend. Please call.

## 2012-09-24 NOTE — Telephone Encounter (Signed)
Returned call to patient no answer.LMTC. 

## 2012-09-25 NOTE — Telephone Encounter (Signed)
Follow up ° ° °Pt returning your call °

## 2012-09-25 NOTE — Telephone Encounter (Signed)
Returned call to patient she stated she wanted [name] to know she had a episode this past weekend in which she spit up bright red blood for appox 10 min.Stated she went to [facility] had CT scan of chest and throat which were normal.Stated she was told to hold Xarelto until her appointment with Dr.David Annalee Genta 09/23/12.Stated Dr.Shoemaker advised ok for her to restart xarelto, his office note to be faxed to [name].[name] out of office will make him aware.

## 2012-12-03 ENCOUNTER — Other Ambulatory Visit: Payer: Self-pay | Admitting: *Deleted

## 2012-12-03 ENCOUNTER — Telehealth: Payer: Self-pay | Admitting: Cardiology

## 2012-12-03 MED ORDER — DILTIAZEM HCL ER COATED BEADS 240 MG PO CP24: 240.0000 mg | ORAL_CAPSULE | Freq: Every day | ORAL | Status: DC

## 2012-12-03 NOTE — Telephone Encounter (Signed)
New Problem:  Pt states she would like a refill of  Cartia XT 240 mg once a day. 90 day supply

## 2013-01-07 ENCOUNTER — Encounter: Payer: Self-pay | Admitting: Internal Medicine

## 2013-01-07 ENCOUNTER — Ambulatory Visit (INDEPENDENT_AMBULATORY_CARE_PROVIDER_SITE_OTHER): Payer: 59 | Admitting: Internal Medicine

## 2013-01-07 VITALS — BP 158/78 | HR 70 | Ht 66.0 in | Wt 127.0 lb

## 2013-01-07 DIAGNOSIS — I495 Sick sinus syndrome: Secondary | ICD-10-CM

## 2013-01-07 DIAGNOSIS — I4891 Unspecified atrial fibrillation: Secondary | ICD-10-CM

## 2013-01-07 DIAGNOSIS — Z950 Presence of cardiac pacemaker: Secondary | ICD-10-CM

## 2013-01-07 LAB — PACEMAKER DEVICE OBSERVATION
AL THRESHOLD: 0.75 V
ATRIAL PACING PM: 63
RV LEAD AMPLITUDE: 11.2 mv
RV LEAD IMPEDENCE PM: 676 Ohm
RV LEAD THRESHOLD: 2.75 V

## 2013-01-07 NOTE — Progress Notes (Signed)
PCP: Gwen Pounds, MD Primary Cardiologist:  Dr [place]  [name] [name] is a 79 y.o. female who presents today for routine electrophysiology followup.  Since last being seen in our clinic, the patient reports doing very well.  She remains very active.   Her afib is well controlled (only 2 episodes < 24 hours in past year).  Today, she denies symptoms of palpitations, chest pain, shortness of breath,  lower extremity edema, dizziness, presyncope, or syncope.  The patient is otherwise without complaint today.   Past Medical History  Diagnosis Date  . Hyperlipidemia   . Hypertension   . Sick sinus syndrome     s/p PPM (MDT)  . Syncope   . Osteoarthritis   . Paroxysmal atrial fibrillation   . Hypothyroidism   . Aortic stenosis     mild   Past Surgical History  Procedure Laterality Date  . Insert / replace / remove pacemaker  1993    for syptomatic bradycardia and syncope -- in [facility]  . Insert / replace / remove pacemaker  2001    pulse generator replacement by Dr. Alanda Amass   . Insert / replace / remove pacemaker  04/01/2008    PPM Medtronic -- model # [id number] serial # [id number] -- pulse generator replacement by Dr. Reyes Ivan   . Abdominal hysterectomy      Current Outpatient Prescriptions  Medication Sig Dispense Refill  . calcium carbonate (OS-CAL) 600 MG TABS Take 600 mg by mouth daily.        . Cholecalciferol (VITAMIN D) 1000 UNITS capsule Take 1,000 Units by mouth daily.        Marland Kitchen diltiazem (CARTIA XT) 240 MG 24 hr capsule Take 1 capsule (240 mg total) by mouth daily.  90 capsule  1  . esomeprazole (NEXIUM) 40 MG capsule Take 40 mg by mouth as needed.       . fluticasone (FLONASE) 50 MCG/ACT nasal spray Place 2 sprays into the nose as needed.       Marland Kitchen levothyroxine (SYNTHROID, LEVOTHROID) 100 MCG tablet Take 100 mcg by mouth daily.        . metoprolol tartrate (LOPRESSOR) 25 MG tablet Take 1 tablet (25 mg total) by mouth 2 (two) times daily.  180 tablet  3  . Rivaroxaban  (XARELTO) 15 MG TABS tablet Take 1 tablet (15 mg total) by mouth daily.  30 tablet  6  . neomycin-polymyxin b-dexamethasone (MAXITROL) 3.5-10000-0.1 SUSP 2 drops daily.       No current facility-administered medications for this visit.    Physical Exam: Filed Vitals:   01/07/13 1001  BP: 158/78  Pulse: 70  Height: 5\' 6"  (1.676 m)  Weight: 127 lb (57.607 kg)    GEN- The patient is well appearing, alert and oriented x 3 today.   Head- normocephalic, atraumatic Eyes-  Sclera clear, conjunctiva pink Ears- hearing intact Oropharynx- clear Lungs- Clear to ausculation bilaterally, normal work of breathing Chest- pacemaker pocket is well healed Heart- Regular rate and rhythm, 2/6 SEM LUSB (mid peaking) GI- soft, NT, ND, + BS Extremities- no clubbing, cyanosis, or edema  Pacemaker interrogation- reviewed in detail today,  See PACEART report  Assessment and Plan: BRADYCARDIA-TACHYCARDIA SYNDROME   Normal pacemaker function  See Pace Art report  No changes today  Return to the device clinic in 6 months  ATRIAL FIBRILLATION  She does not wish to consider AAD therapy at this time Continue xarelto  Return to the device clinic in 6 months I  will see again in 1year

## 2013-01-07 NOTE — Patient Instructions (Signed)
Your physician wants you to follow-up in: 6 months with device clinic and 12 months with Dr Allred You will receive a reminder letter in the mail two months in advance. If you don't receive a letter, please call our office to schedule the follow-up appointment.  

## 2013-01-14 ENCOUNTER — Encounter: Payer: Self-pay | Admitting: *Deleted

## 2013-02-02 ENCOUNTER — Ambulatory Visit (INDEPENDENT_AMBULATORY_CARE_PROVIDER_SITE_OTHER): Payer: 59 | Admitting: *Deleted

## 2013-02-02 VITALS — BP 127/79 | HR 73 | Ht 66.0 in | Wt 127.6 lb

## 2013-02-02 DIAGNOSIS — I4891 Unspecified atrial fibrillation: Secondary | ICD-10-CM

## 2013-02-02 NOTE — Patient Instructions (Signed)
You are in At Fib today but with a well controlled HR.  BP is good.  Continue to take medications as instructed.  Please call back if you decide to take an antiarrythmic medication or symptoms change.

## 2013-02-02 NOTE — Progress Notes (Signed)
Pt walked in today stating she woke from her sleep about 2:30 am with an increased HR, feeling very hot and had a fullness in the epigastric area.  She took her metoprolol about 7 am and now presents to be evaluated.  EKG demonstrates At Fib with a rate of 73.  She does still feel the fullness in the epi-gastric area and can tell she is out of rhythm.  She does not want to take an anti-arhythmic medication at this time.  EKGs taken to Dr Johney Frame and reviewed with his nurse Dennis Bast, RN.  Pt will continue current plan and call back if s/s change or HR increases.  She is agreeable with the plan.

## 2013-03-05 ENCOUNTER — Other Ambulatory Visit: Payer: Self-pay | Admitting: Internal Medicine

## 2013-03-06 ENCOUNTER — Other Ambulatory Visit: Payer: Self-pay | Admitting: *Deleted

## 2013-03-06 MED ORDER — RIVAROXABAN 15 MG PO TABS: ORAL_TABLET | ORAL | Status: DC

## 2013-03-13 ENCOUNTER — Encounter: Payer: Self-pay | Admitting: Cardiology

## 2013-03-13 ENCOUNTER — Ambulatory Visit (INDEPENDENT_AMBULATORY_CARE_PROVIDER_SITE_OTHER): Payer: 59 | Admitting: Cardiology

## 2013-03-13 VITALS — BP 140/88 | HR 71 | Ht 66.0 in | Wt 128.1 lb

## 2013-03-13 DIAGNOSIS — I10 Essential (primary) hypertension: Secondary | ICD-10-CM

## 2013-03-13 DIAGNOSIS — I350 Nonrheumatic aortic (valve) stenosis: Secondary | ICD-10-CM

## 2013-03-13 DIAGNOSIS — I359 Nonrheumatic aortic valve disorder, unspecified: Secondary | ICD-10-CM

## 2013-03-13 DIAGNOSIS — I4891 Unspecified atrial fibrillation: Secondary | ICD-10-CM

## 2013-03-13 DIAGNOSIS — E785 Hyperlipidemia, unspecified: Secondary | ICD-10-CM

## 2013-03-13 NOTE — Addendum Note (Signed)
Addended by: Golden Hurter D on: 03/13/2013 09:03 AM   Modules accepted: Orders

## 2013-03-13 NOTE — Progress Notes (Signed)
[name] [name] Date of Birth: 10/30/1929 Medical Record #[id number]  History of Present Illness: [name] [name] is seen today for a followup visit. She has been doing very well from a cardiac standpoint. She denies any  dizziness, shortness of breath, or chest pain. She has had very infrequent episodes of palpitations. She reports one episode that occurred on Feb 02, 2013 that lasted several hours. Ecg did confirm Afib with rate 73 bpm. Her pacemaker check in Nov demonstrated mode switches 0.5% of the time. Her ventricular rate was well controlled. No CVA or TIA symptoms.   Current Outpatient Prescriptions on File Prior to Visit  Medication Sig Dispense Refill  . calcium carbonate (OS-CAL) 600 MG TABS Take 600 mg by mouth daily.        . Cholecalciferol (VITAMIN D) 1000 UNITS capsule Take 1,000 Units by mouth daily.        Marland Kitchen diltiazem (CARTIA XT) 240 MG 24 hr capsule Take 1 capsule (240 mg total) by mouth daily.  90 capsule  1  . esomeprazole (NEXIUM) 40 MG capsule Take 40 mg by mouth as needed.       . fluticasone (FLONASE) 50 MCG/ACT nasal spray Place 2 sprays into the nose as needed.       Marland Kitchen levothyroxine (SYNTHROID, LEVOTHROID) 100 MCG tablet Take 100 mcg by mouth daily.        . metoprolol tartrate (LOPRESSOR) 25 MG tablet Take 1 tablet (25 mg total) by mouth 2 (two) times daily.  180 tablet  3  . Rivaroxaban (XARELTO) 15 MG TABS tablet TAKE 1 TABLET BY MOUTH EVERY DAY  90 tablet  1   No current facility-administered medications on file prior to visit.    Allergies  Allergen Reactions  . Codeine   . Demerol   . Meperidine Hcl   . Penicillins   . Tetanus Toxoid     Past Medical History  Diagnosis Date  . Hyperlipidemia   . Hypertension   . Sick sinus syndrome     s/p PPM (MDT)  . Syncope   . Osteoarthritis   . Paroxysmal atrial fibrillation   . Hypothyroidism   . Aortic stenosis     mild    Past Surgical History  Procedure Laterality Date  . Insert / replace / remove  pacemaker  1993    for syptomatic bradycardia and syncope -- in [facility]  . Insert / replace / remove pacemaker  2001    pulse generator replacement by Dr. Rollene Fare   . Insert / replace / remove pacemaker  04/01/2008    PPM Medtronic -- model # [id number] serial # [id number] -- pulse generator replacement by Dr. Verlon Setting   . Abdominal hysterectomy      History  Smoking status  . Former Smoker  . Quit date: 03/05/1978  Smokeless tobacco  . Never Used    History  Alcohol Use No    Family History  Problem Relation Age of Onset  . Cancer Father     lung cancer  . Alzheimer's disease Mother     Review of Systems: As noted in history of present illness. All other systems were reviewed and are negative.  Physical Exam: BP 140/88  Pulse 71  Ht 5\' 6"  (1.676 m)  Wt 128 lb 1.9 oz (58.115 kg)  BMI 20.69 kg/m2 Patient is very pleasant and in no acute distress.  Skin is warm and dry. Color is normal.  HEENT is unremarkable. Normocephalic/atraumatic. PERRL. Sclera are nonicteric.  Neck is supple. No masses. No JVD. Lungs are clear. Cardiac exam shows a regular rate and rhythm. Grade 2/6 systolic murmur RUSB>>apex. Abdomen is soft. Extremities are without edema. Gait and ROM are intact. No gross neurologic deficits noted.  LABORATORY DATA: ECG today demonstrates an atrially paced rhythm at a rate of 71 beats per minute. It is otherwise normal.  Assessment / Plan: 1. Paroxysmal atrial fibrillation with sick sinus syndrome. Status post pacemaker implant. Very infrequent episodes of atrial fibrillation with adequate rate control. Continue long-term anticoagulation. Continue current therapy. We discussed that if she has more frequent episodes of Afib we could consider antiarrhythmic drug therapy or even a "pill in a pocket" 2. Aortic stenosis- mild by Echo in 2012. Will update Echo in 6 months. 3. HTN controlled.

## 2013-03-13 NOTE — Patient Instructions (Signed)
Continue your current therapy  I will see you in 6 months with an echocardiogram

## 2013-04-14 ENCOUNTER — Ambulatory Visit (INDEPENDENT_AMBULATORY_CARE_PROVIDER_SITE_OTHER): Payer: 59 | Admitting: *Deleted

## 2013-04-14 VITALS — BP 122/70 | HR 73 | Wt 130.8 lb

## 2013-04-14 DIAGNOSIS — I4891 Unspecified atrial fibrillation: Secondary | ICD-10-CM

## 2013-04-14 NOTE — Progress Notes (Signed)
Patient came to office this afternoon (driven by her sister in law) after noting atrial fibrillation starting last night around midnight.  She remembered that Dr. Martinique told her she could take an extra metoprolol if she felt the irregular rhythm, so at 4am she took 25mg  tab.   When she woke this am she didn't know if she should take the regular am dose, even tho the irregular rhythm continued.  Other than being aware of the arrythmia, she is not symptomatic.  She denies feeling sob, lightheadedness, fatigued.  She is anticoagulated on xarelto.  She has a pacer.   BP and HR are stable. Instructed on taking one extra dose of metoprolol in addition to the bid dose, and to take today's AM dose when she gets home.  Discussed s/s of rapid AF.  Pt verbalizes understanding.  Dr. Marlou Porch (covering DOD) reviewed EKG and states to have pt call if she stays in AFIB, since it can be bothersome for her and itis not chronic.  Pt verbalizes understanding and agreement with this plan.  Escorted her to lobby in stable condition.

## 2013-05-21 ENCOUNTER — Other Ambulatory Visit: Payer: Self-pay

## 2013-05-21 MED ORDER — DILTIAZEM HCL ER COATED BEADS 240 MG PO CP24: 240.0000 mg | ORAL_CAPSULE | Freq: Every day | ORAL | Status: DC

## 2013-05-27 ENCOUNTER — Other Ambulatory Visit (HOSPITAL_COMMUNITY): Payer: 59

## 2013-06-09 ENCOUNTER — Other Ambulatory Visit: Payer: Self-pay | Admitting: *Deleted

## 2013-06-09 ENCOUNTER — Ambulatory Visit (HOSPITAL_COMMUNITY)
Admission: RE | Admit: 2013-06-09 | Discharge: 2013-06-09 | Disposition: A | Discharge status: Home / Self Care | Payer: Medicare Other | Source: Ambulatory Visit | Attending: Vascular Surgery | Admitting: Vascular Surgery

## 2013-06-09 ENCOUNTER — Other Ambulatory Visit (HOSPITAL_COMMUNITY): Payer: Self-pay | Admitting: Internal Medicine

## 2013-06-09 DIAGNOSIS — H9319 Tinnitus, unspecified ear: Secondary | ICD-10-CM

## 2013-06-09 MED ORDER — METOPROLOL TARTRATE 25 MG PO TABS: 25.0000 mg | ORAL_TABLET | Freq: Two times a day (BID) | ORAL | Status: DC

## 2013-06-10 ENCOUNTER — Other Ambulatory Visit: Payer: Self-pay

## 2013-06-10 MED ORDER — METOPROLOL TARTRATE 25 MG PO TABS: 25.0000 mg | ORAL_TABLET | Freq: Two times a day (BID) | ORAL | Status: DC

## 2013-06-15 ENCOUNTER — Telehealth: Payer: Self-pay | Admitting: *Deleted

## 2013-06-15 NOTE — Telephone Encounter (Signed)
PA to [facility] for xarelto 

## 2013-06-15 NOTE — Telephone Encounter (Signed)
[facility] appreved xarelto 15 mg tablets through 06/15/2014, PA # [id number]

## 2013-07-06 ENCOUNTER — Encounter (INDEPENDENT_AMBULATORY_CARE_PROVIDER_SITE_OTHER): Payer: Self-pay

## 2013-07-06 ENCOUNTER — Ambulatory Visit (INDEPENDENT_AMBULATORY_CARE_PROVIDER_SITE_OTHER): Payer: Medicare Other | Admitting: *Deleted

## 2013-07-06 DIAGNOSIS — I495 Sick sinus syndrome: Secondary | ICD-10-CM

## 2013-07-06 DIAGNOSIS — I4891 Unspecified atrial fibrillation: Secondary | ICD-10-CM

## 2013-07-06 LAB — MDC_IDC_ENUM_SESS_TYPE_INCLINIC
Battery Remaining Longevity: 128 mo
Battery Voltage: 2.8 V
Lead Channel Pacing Threshold Amplitude: 0.75 V
Lead Channel Pacing Threshold Amplitude: 1.25 V
Lead Channel Pacing Threshold Pulse Width: 1 ms
Lead Channel Sensing Intrinsic Amplitude: 8 mV
Lead Channel Setting Pacing Amplitude: 2 V
Lead Channel Setting Pacing Amplitude: 2.75 V
Lead Channel Setting Pacing Pulse Width: 1 ms
Lead Channel Setting Sensing Sensitivity: 4 mV
MDC IDC MSMT BATTERY IMPEDANCE: 206 Ohm
MDC IDC MSMT LEADCHNL RA IMPEDANCE VALUE: 617 Ohm
MDC IDC MSMT LEADCHNL RA PACING THRESHOLD PULSEWIDTH: 0.4 ms
MDC IDC MSMT LEADCHNL RA SENSING INTR AMPL: 2.8 mV
MDC IDC MSMT LEADCHNL RV IMPEDANCE VALUE: 681 Ohm
MDC IDC SESS DTM: 20150504092407
MDC IDC STAT BRADY AP VP PERCENT: 0 %
MDC IDC STAT BRADY AP VS PERCENT: 72 %
MDC IDC STAT BRADY AS VP PERCENT: 0 %
MDC IDC STAT BRADY AS VS PERCENT: 28 %

## 2013-07-06 NOTE — Progress Notes (Signed)
Pacemaker check in clinic. Normal device function. Thresholds, sensing, impedances consistent with previous measurements. Device programmed to maximize longevity. 133 mode switches (0.7%)---2 AHR episodes---max dur. 20 hours, Max A >400, Max V 183, Max Avg V 92---last 2-9---AF+Xarelto. No high ventricular rates noted. Device programmed at appropriate safety margins. Histogram distribution appropriate for patient activity level. Device programmed to optimize intrinsic conduction. Estimated longevity 10.5 years. Patient will follow up with JA in 6 months.

## 2013-07-24 ENCOUNTER — Encounter: Payer: Self-pay | Admitting: Internal Medicine

## 2013-08-18 ENCOUNTER — Encounter: Payer: Self-pay | Admitting: Internal Medicine

## 2013-08-18 ENCOUNTER — Ambulatory Visit (INDEPENDENT_AMBULATORY_CARE_PROVIDER_SITE_OTHER): Payer: Medicare Other | Admitting: Internal Medicine

## 2013-08-18 ENCOUNTER — Telehealth: Payer: Self-pay | Admitting: Internal Medicine

## 2013-08-18 VITALS — BP 144/76 | HR 72 | Ht 65.0 in | Wt 126.1 lb

## 2013-08-18 DIAGNOSIS — K219 Gastro-esophageal reflux disease without esophagitis: Secondary | ICD-10-CM

## 2013-08-18 DIAGNOSIS — K625 Hemorrhage of anus and rectum: Secondary | ICD-10-CM

## 2013-08-18 DIAGNOSIS — K648 Other hemorrhoids: Secondary | ICD-10-CM

## 2013-08-18 MED ORDER — HYDROCORTISONE ACETATE 25 MG RE SUPP: 25.0000 mg | Freq: Every day | RECTAL | Status: DC

## 2013-08-18 MED ORDER — PSYLLIUM 28 % PO PACK: 1.0000 | PACK | Freq: Every day | ORAL | Status: DC

## 2013-08-18 NOTE — Telephone Encounter (Signed)
Pt states that she noticed bright red blood on her stool last evening and this morning. Pt states she is taking Xeralto. Pt would like to be seen if Dr. Henrene Pastor thinks she needs to come in. There is a 2:15pm appt open, would it be ok to schedule her then if she needs to be seen? Please advise.

## 2013-08-18 NOTE — Telephone Encounter (Signed)
I would be happy to check her out today if she would like to come in. If this is not convenient for her, she can see an extender this week.

## 2013-08-18 NOTE — Patient Instructions (Signed)
We have sent the following medications to your pharmacy for you to pick up at your convenience:  An[facility] suppositories  You may mix 1 tablespoon of Metamucil into 8 ounces of juice or water daily.

## 2013-08-18 NOTE — Telephone Encounter (Signed)
Pt will come for OV today at 2:15pm.

## 2013-08-18 NOTE — Progress Notes (Signed)
HISTORY OF PRESENT ILLNESS:  [name] [name] is a 80 y.o. female with hypertension, hyperlipidemia, sick sinus syndrome status post pacemaker placement, paroxysmal atrial fibrillation on Xarelto, hypothyroidism, GERD, and osteoarthritis. Last evaluated May 2013 for right-sided abdominal discomfort. See that dictation. Negative CT scan. No GI issues until yesterday afternoon when she think she saw some bright red blood on the tissue. This morning she noticed minor amounts of bright red blood on the tip of her stool. Slight bright red blood when wiping. The stool itself is otherwise normal appearing informed. She contacted the office this morning and was worked in to my schedule this afternoon.. She denies rectal pain. No other complaints. Has been doing well. Generally has daily bowel movements. Last complete colonoscopy 2002. Noted to have left-sided diverticulosis and internal hemorrhoids. Otherwise negative  REVIEW OF SYSTEMS:  All non-GI ROS negative except for sinus and allergy, arthritis  Past Medical History  Diagnosis Date  . Hyperlipidemia   . Hypertension   . Sick sinus syndrome     s/p PPM (MDT)  . Syncope   . Osteoarthritis   . Paroxysmal atrial fibrillation   . Hypothyroidism   . Aortic stenosis     mild  . GERD (gastroesophageal reflux disease)   . Diverticulosis   . Hemorrhoids     Past Surgical History  Procedure Laterality Date  . Insert / replace / remove pacemaker  1993    for syptomatic bradycardia and syncope -- in [facility]  . Insert / replace / remove pacemaker  2001    pulse generator replacement by Dr. Rollene Fare   . Insert / replace / remove pacemaker  04/01/2008    PPM Medtronic -- model # [id number] serial # [id number] -- pulse generator replacement by Dr. Verlon Setting   . Abdominal hysterectomy    . Basal cell carcinoma excision    . Squamous cell carcinoma excision      Social History [name] [name]  reports that she quit smoking about 35 years ago. She has  never used smokeless tobacco. She reports that she does not drink alcohol or use illicit drugs.  family history includes Alzheimer's disease in her mother; Cancer in her father.  Allergies  Allergen Reactions  . Codeine   . Demerol   . Meperidine Hcl   . Penicillins   . Tetanus Toxoid        PHYSICAL EXAMINATION: Vital signs: BP 144/76  Pulse 72  Ht 5\' 5"  (1.651 m)  Wt 126 lb 2 oz (57.21 kg)  BMI 20.99 kg/m2  Constitutional: generally well-appearing elderly female, no acute distress Psychiatric: alert and oriented x3, cooperative Eyes: extraocular movements intact, anicteric, conjunctiva pink Mouth: oral pharynx moist, no lesions Neck: supple no lymphadenopathy Cardiovascular: heart regular rate and rhythm, no murmur Lungs: clear to auscultation bilaterally Abdomen: soft, nontender, nondistended, no obvious ascites, no peritoneal signs, normal bowel sounds, no organomegaly Rectal: No external abnormalities. Brown Hemoccult negative stool. Slightly prolapsed friable internal hemorrhoid with Valsalva Extremities: no lower extremity edema bilaterally Skin: no lesions on visible extremities Neuro: No focal deficits.   ASSESSMENT:  #1. Minor rectal bleeding secondary to internal hemorrhoid. #2. Colonoscopy 2002 with diverticulosis and internal hemorrhoids #3. History of GERD. Last EGD January 2006 was normal #4. Multiple medical problems including history of a atrial arrhythmia for which she is on Xarelto  PLAN:  #1. Daily fiber supplementation in the form of Metamucil #2. Anusol-HC suppositories prescribed. One at night for one week. Thereafter, using as needed #  3. GI followup when necessary

## 2013-09-12 ENCOUNTER — Other Ambulatory Visit: Payer: Self-pay | Admitting: Internal Medicine

## 2013-09-14 ENCOUNTER — Ambulatory Visit (HOSPITAL_COMMUNITY): Payer: Medicare Other | Attending: Cardiology | Admitting: Radiology

## 2013-09-14 DIAGNOSIS — I10 Essential (primary) hypertension: Secondary | ICD-10-CM

## 2013-09-14 DIAGNOSIS — E785 Hyperlipidemia, unspecified: Secondary | ICD-10-CM

## 2013-09-14 DIAGNOSIS — I4891 Unspecified atrial fibrillation: Secondary | ICD-10-CM | POA: Insufficient documentation

## 2013-09-14 DIAGNOSIS — I359 Nonrheumatic aortic valve disorder, unspecified: Secondary | ICD-10-CM

## 2013-09-14 DIAGNOSIS — I350 Nonrheumatic aortic (valve) stenosis: Secondary | ICD-10-CM

## 2013-09-14 NOTE — Progress Notes (Signed)
Echocardiogram performed.  

## 2013-09-15 ENCOUNTER — Ambulatory Visit (INDEPENDENT_AMBULATORY_CARE_PROVIDER_SITE_OTHER): Payer: Medicare Other | Admitting: Cardiology

## 2013-09-15 ENCOUNTER — Encounter: Payer: Self-pay | Admitting: Cardiology

## 2013-09-15 ENCOUNTER — Telehealth: Payer: Self-pay | Admitting: Cardiology

## 2013-09-15 VITALS — BP 160/90 | HR 82 | Ht 65.5 in | Wt 127.0 lb

## 2013-09-15 DIAGNOSIS — I10 Essential (primary) hypertension: Secondary | ICD-10-CM

## 2013-09-15 DIAGNOSIS — I480 Paroxysmal atrial fibrillation: Secondary | ICD-10-CM

## 2013-09-15 DIAGNOSIS — E785 Hyperlipidemia, unspecified: Secondary | ICD-10-CM

## 2013-09-15 DIAGNOSIS — Z950 Presence of cardiac pacemaker: Secondary | ICD-10-CM

## 2013-09-15 DIAGNOSIS — I4891 Unspecified atrial fibrillation: Secondary | ICD-10-CM

## 2013-09-15 DIAGNOSIS — I495 Sick sinus syndrome: Secondary | ICD-10-CM

## 2013-09-15 DIAGNOSIS — I350 Nonrheumatic aortic (valve) stenosis: Secondary | ICD-10-CM

## 2013-09-15 DIAGNOSIS — I359 Nonrheumatic aortic valve disorder, unspecified: Secondary | ICD-10-CM

## 2013-09-15 NOTE — Progress Notes (Signed)
[name] [name] Date of Birth: [birth year] Medical Record #[id number]  History of Present Illness: [name] [name] is seen today for a followup visit. She has a history of paroxysmal atrial fibrillation with tachy-brady syndrome and is s/p pacemaker implant. She is on chronic anticoagulation with Xarelto. She has been doing very well from a cardiac standpoint. She denies any  dizziness, shortness of breath, or chest pain.  No CVA or TIA symptoms. She did have one episode of bright red blood per rectum. She saw Dr. Henrene Pastor who stated this was related to hemorrhoids. No recurrence. She also has a history of mild aortic stenosis by Echo in 2012.  Current Outpatient Prescriptions on File Prior to Visit  Medication Sig Dispense Refill  . calcium carbonate (OS-CAL) 600 MG TABS Take 600 mg by mouth daily.        . Cholecalciferol (VITAMIN D) 1000 UNITS capsule Take 1,000 Units by mouth daily.        Marland Kitchen diltiazem (CARTIA XT) 240 MG 24 hr capsule Take 1 capsule (240 mg total) by mouth daily.  90 capsule  1  . esomeprazole (NEXIUM) 40 MG capsule Take 40 mg by mouth as needed.       Marland Kitchen levothyroxine (SYNTHROID, LEVOTHROID) 100 MCG tablet Take 100 mcg by mouth daily.        . metoprolol tartrate (LOPRESSOR) 25 MG tablet Take 1 tablet (25 mg total) by mouth 2 (two) times daily.  180 tablet  3  . psyllium (METAMUCIL SMOOTH TEXTURE) 28 % packet Take 1 packet by mouth daily.  30 packet  0  . XARELTO 15 MG TABS tablet TAKE 1 TABLET BY MOUTH DAILY  90 tablet  0   No current facility-administered medications on file prior to visit.    Allergies  Allergen Reactions  . Codeine   . Demerol   . Meperidine Hcl   . Penicillins   . Tetanus Toxoid     Past Medical History  Diagnosis Date  . Hyperlipidemia   . Hypertension   . Sick sinus syndrome     s/p PPM (MDT)  . Syncope   . Osteoarthritis   . Paroxysmal atrial fibrillation   . Hypothyroidism   . Aortic stenosis     mild  . GERD (gastroesophageal reflux  disease)   . Diverticulosis   . Hemorrhoids     Past Surgical History  Procedure Laterality Date  . Insert / replace / remove pacemaker  1993    for syptomatic bradycardia and syncope -- in [facility]  . Insert / replace / remove pacemaker  2001    pulse generator replacement by Dr. Rollene Fare   . Insert / replace / remove pacemaker  04/01/2008    PPM Medtronic -- model # [id number] serial # [id number] -- pulse generator replacement by Dr. Verlon Setting   . Abdominal hysterectomy    . Basal cell carcinoma excision    . Squamous cell carcinoma excision      History  Smoking status  . Former Smoker  . Quit date: 03/05/1978  Smokeless tobacco  . Never Used    History  Alcohol Use No    Family History  Problem Relation Age of Onset  . Cancer Father     lung cancer  . Alzheimer's disease Mother     Review of Systems: As noted in history of present illness. All other systems were reviewed and are negative.  Physical Exam: BP 160/90  Pulse 82  Ht 5' 5.5" (1.664  m)  Wt 127 lb (57.607 kg)  BMI 20.81 kg/m2 Patient is very pleasant and in no acute distress.  Skin is warm and dry. Color is normal.  HEENT is unremarkable. Normocephalic/atraumatic. PERRL. Sclera are nonicteric. Neck is supple. No masses. No JVD. Lungs are clear. Cardiac exam shows a regular rate and rhythm. Grade 2/6 systolic murmur RUSB>>apex. Abdomen is soft. Extremities are without edema. Gait and ROM are intact. No gross neurologic deficits noted.  LABORATORY DATA: Echo: 09/14/13:Study Conclusions  - Left ventricle: The cavity size was normal. Wall thickness was normal. Systolic function was normal. The estimated ejection fraction was in the range of 55% to 60%. Wall motion was normal; there were no regional wall motion abnormalities. Doppler parameters are consistent with abnormal left ventricular relaxation (grade 1 diastolic dysfunction). - Aortic valve: Valve mobility was restricted. There was  moderate stenosis. There was mild regurgitation. - Mitral valve: There was moderate regurgitation. - Tricuspid valve: There was moderate regurgitation.  Impressions:  - Normal LV function; moderate MR and TR; moderate AS (mean gradient 24 mmHg); mild AI.     Assessment / Plan: 1. Paroxysmal atrial fibrillation with sick sinus syndrome. Status post pacemaker implant. Very infrequent episodes of atrial fibrillation with adequate rate control. 0.7% by last pacer check.  Continue long-term anticoagulation with Xarelto. Dose adjusted for age and renal function. Continue current therapy.   2. Aortic stenosis- moderate by Echo. Progressed since 2012. Asymptomatic. Will repeat Echo in one year.  3. HTN- significantly elevated today which is unusual for her. Normally BP in 120 range. She will monitor BP at home and let us know if it is staying high. She also has a follow up with Dr. Virgina Jock in the next couple of weeks.

## 2013-09-15 NOTE — Patient Instructions (Signed)
Continue your current therapy  We will check another Echocardiogram in one year.  I will see you in 6 months.  Keep an eye on your blood pressure and let me know if it stays high.

## 2013-09-15 NOTE — Telephone Encounter (Signed)
Spoke to patient [name] already gave her echo results at appointment this morning.Advised to have echo repeated in 1 year.Advised to keep appointment with [name] as planned and call sooner if needed.

## 2013-09-15 NOTE — Telephone Encounter (Signed)
New Prob ° ° ° °Pt returning call from earlier. Please call. °

## 2013-09-15 NOTE — Telephone Encounter (Signed)
Returned call to patient no answer.LMTC. 

## 2013-09-15 NOTE — Telephone Encounter (Signed)
Call forwarded to [facility].

## 2013-09-22 ENCOUNTER — Ambulatory Visit: Payer: 59 | Admitting: Cardiology

## 2013-11-25 ENCOUNTER — Other Ambulatory Visit: Payer: Self-pay | Admitting: Cardiology

## 2013-12-13 ENCOUNTER — Other Ambulatory Visit: Payer: Self-pay | Admitting: Internal Medicine

## 2014-02-05 ENCOUNTER — Encounter: Payer: Self-pay | Admitting: Internal Medicine

## 2014-02-05 ENCOUNTER — Ambulatory Visit (INDEPENDENT_AMBULATORY_CARE_PROVIDER_SITE_OTHER): Payer: Medicare Other | Admitting: Internal Medicine

## 2014-02-05 VITALS — BP 132/80 | HR 71 | Ht 65.0 in | Wt 126.8 lb

## 2014-02-05 DIAGNOSIS — I495 Sick sinus syndrome: Secondary | ICD-10-CM

## 2014-02-05 DIAGNOSIS — I350 Nonrheumatic aortic (valve) stenosis: Secondary | ICD-10-CM

## 2014-02-05 DIAGNOSIS — Z950 Presence of cardiac pacemaker: Secondary | ICD-10-CM

## 2014-02-05 DIAGNOSIS — I480 Paroxysmal atrial fibrillation: Secondary | ICD-10-CM

## 2014-02-05 NOTE — Patient Instructions (Signed)
Your physician wants you to follow-up in: 6 months with device clinic and 12 months with Dr [name] Dennis Bast will receive a reminder letter in the mail two months in advance. If you don't receive a letter, please call our office to schedule the follow-up appointment.

## 2014-02-05 NOTE — Progress Notes (Signed)
PCP: [name], MD Primary Cardiologist:  Dr Martinique  [name] [name] is a 80 y.o. female who presents today for routine electrophysiology followup.  Since last being seen in our clinic, the patient reports doing very well.  She remains very active.   Her afib is well controlled (only 1 episodes < 24 hours in past year).  Today, she denies symptoms of palpitations, chest pain, dizziness, presyncope, or syncope.  She has mild SOB which is stable.  She has very little ankle edema at night which resolves by am.  The patient is otherwise without complaint today.   Past Medical History  Diagnosis Date  . Hyperlipidemia   . Hypertension   . Sick sinus syndrome     s/p PPM (MDT)  . Syncope   . Osteoarthritis   . Paroxysmal atrial fibrillation   . Hypothyroidism   . Aortic stenosis     mild  . GERD (gastroesophageal reflux disease)   . Diverticulosis   . Hemorrhoids    Past Surgical History  Procedure Laterality Date  . Insert / replace / remove pacemaker  1993    for syptomatic bradycardia and syncope -- in [facility]  . Insert / replace / remove pacemaker  2001    pulse generator replacement by Dr. Rollene Fare   . Insert / replace / remove pacemaker  04/01/2008    PPM Medtronic -- model # [id number] serial # [id number] -- pulse generator replacement by Dr. Verlon Setting   . Abdominal hysterectomy    . Basal cell carcinoma excision    . Squamous cell carcinoma excision      Current Outpatient Prescriptions  Medication Sig Dispense Refill  . calcium carbonate (OS-CAL) 600 MG TABS Take 600 mg by mouth daily.      Marland Kitchen CARTIA XT 240 MG 24 hr capsule TAKE 1 CAPSULE BY MOUTH EVERY DAY 90 capsule 0  . Cholecalciferol (VITAMIN D) 1000 UNITS capsule Take 1,000 Units by mouth daily.      Marland Kitchen esomeprazole (NEXIUM) 40 MG capsule Take 40 mg by mouth daily as needed (heartburn).     Marland Kitchen levothyroxine (SYNTHROID, LEVOTHROID) 100 MCG tablet Take 100 mcg by mouth daily.      . metoprolol tartrate (LOPRESSOR) 25 MG  tablet Take 1 tablet (25 mg total) by mouth 2 (two) times daily. 180 tablet 3  . psyllium (METAMUCIL SMOOTH TEXTURE) 28 % packet Take 1 packet by mouth daily. (Patient taking differently: Take 1 packet by mouth daily as needed (constipation). ) 30 packet 0  . XARELTO 15 MG TABS tablet TAKE 1 TABLET BY MOUTH EVERY DAY 90 tablet 3   No current facility-administered medications for this visit.    Physical Exam: Filed Vitals:   02/05/14 1050  BP: 132/80  Pulse: 71  Height: 5\' 5"  (1.651 m)  Weight: 126 lb 12.8 oz (57.516 kg)    GEN- The patient is well appearing, alert and oriented x 3 today.   Head- normocephalic, atraumatic Eyes-  Sclera clear, conjunctiva pink Ears- hearing intact Oropharynx- clear Lungs- Clear to ausculation bilaterally, normal work of breathing Chest- pacemaker pocket is well healed Heart- Regular rate and rhythm, 2/6 SEM LUSB (mid peaking) GI- soft, NT, ND, + BS Extremities- no clubbing, cyanosis, or edema  Pacemaker interrogation- reviewed in detail today,  See PACEART report  Assessment and Plan: BRADYCARDIA-TACHYCARDIA SYNDROME   Normal pacemaker function  See Pace Art report  No changes today  She delines remote monitoring  ATRIAL FIBRILLATION  Continue xarelto  Consider AAD if her afib burden increases  AS Mild previously Stable clinically Dr Martinique to follow-up with an echo as scheduled  Return to the device clinic in 6 months I will see again in 1year

## 2014-02-08 ENCOUNTER — Other Ambulatory Visit: Payer: Self-pay | Admitting: Cardiology

## 2014-02-10 LAB — MDC_IDC_ENUM_SESS_TYPE_INCLINIC
Battery Remaining Longevity: 130 mo
Battery Voltage: 2.8 V
Brady Statistic AP VP Percent: 0 %
Brady Statistic AP VS Percent: 67 %
Brady Statistic AS VP Percent: 0 %
Date Time Interrogation Session: 20151204155237
Lead Channel Pacing Threshold Amplitude: 0.75 V
Lead Channel Pacing Threshold Amplitude: 1.5 V
Lead Channel Pacing Threshold Pulse Width: 0.4 ms
Lead Channel Sensing Intrinsic Amplitude: 11.2 mV
Lead Channel Sensing Intrinsic Amplitude: 2.8 mV
MDC IDC MSMT BATTERY IMPEDANCE: 230 Ohm
MDC IDC MSMT LEADCHNL RA IMPEDANCE VALUE: 661 Ohm
MDC IDC MSMT LEADCHNL RV IMPEDANCE VALUE: 659 Ohm
MDC IDC MSMT LEADCHNL RV PACING THRESHOLD PULSEWIDTH: 1 ms
MDC IDC SET LEADCHNL RA PACING AMPLITUDE: 2 V
MDC IDC SET LEADCHNL RV PACING AMPLITUDE: 3 V
MDC IDC SET LEADCHNL RV PACING PULSEWIDTH: 1 ms
MDC IDC SET LEADCHNL RV SENSING SENSITIVITY: 4 mV
MDC IDC STAT BRADY AS VS PERCENT: 33 %

## 2014-03-15 ENCOUNTER — Ambulatory Visit (INDEPENDENT_AMBULATORY_CARE_PROVIDER_SITE_OTHER): Payer: 59 | Admitting: Cardiology

## 2014-03-15 ENCOUNTER — Encounter: Payer: Self-pay | Admitting: Cardiology

## 2014-03-15 VITALS — BP 134/82 | HR 89 | Ht 65.0 in | Wt 128.0 lb

## 2014-03-15 DIAGNOSIS — I480 Paroxysmal atrial fibrillation: Secondary | ICD-10-CM

## 2014-03-15 DIAGNOSIS — I495 Sick sinus syndrome: Secondary | ICD-10-CM

## 2014-03-15 DIAGNOSIS — Z7901 Long term (current) use of anticoagulants: Secondary | ICD-10-CM

## 2014-03-15 DIAGNOSIS — I350 Nonrheumatic aortic (valve) stenosis: Secondary | ICD-10-CM

## 2014-03-15 NOTE — Patient Instructions (Signed)
Continue your current therapy  I will see you in 6 months with an Echocardiogram   

## 2014-03-15 NOTE — Progress Notes (Signed)
[name] [name] Date of Birth: 01/30/1930 Medical Record #[id number]  History of Present Illness: [name] [name] is seen today for follow up of atrial fibrillation and aortic stenosis. She has a history of paroxysmal atrial fibrillation with tachy-brady syndrome and is s/p pacemaker implant. She is on chronic anticoagulation with Xarelto. Echo in July 2015 showed moderate aortic stenosis that has progressed since 2012. She has been doing very well from a cardiac standpoint. She denies any  dizziness, shortness of breath, or chest pain.  No CVA or TIA symptoms. Her last pacemaker check showed multiple episodes of Afib but ventricular rate well controlled and she is largely asymptomatic.   Current Outpatient Prescriptions on File Prior to Visit  Medication Sig Dispense Refill  . calcium carbonate (OS-CAL) 600 MG TABS Take 600 mg by mouth daily.      Marland Kitchen CARTIA XT 240 MG 24 hr capsule TAKE 1 CAPSULE BY MOUTH EVERY DAY 90 capsule 0  . Cholecalciferol (VITAMIN D) 1000 UNITS capsule Take 1,000 Units by mouth daily.      Marland Kitchen esomeprazole (NEXIUM) 40 MG capsule Take 40 mg by mouth daily as needed (heartburn).     Marland Kitchen levothyroxine (SYNTHROID, LEVOTHROID) 100 MCG tablet Take 100 mcg by mouth daily.      . metoprolol tartrate (LOPRESSOR) 25 MG tablet Take 1 tablet (25 mg total) by mouth 2 (two) times daily. 180 tablet 3  . XARELTO 15 MG TABS tablet TAKE 1 TABLET BY MOUTH EVERY DAY 90 tablet 3   No current facility-administered medications on file prior to visit.    Allergies  Allergen Reactions  . Codeine   . Demerol   . Meperidine Hcl   . Penicillins   . Tetanus Toxoid     Past Medical History  Diagnosis Date  . Hyperlipidemia   . Hypertension   . Sick sinus syndrome     s/p PPM (MDT)  . Syncope   . Osteoarthritis   . Paroxysmal atrial fibrillation   . Hypothyroidism   . Aortic stenosis     mild  . GERD (gastroesophageal reflux disease)   . Diverticulosis   . Hemorrhoids     Past  Surgical History  Procedure Laterality Date  . Insert / replace / remove pacemaker  1993    for syptomatic bradycardia and syncope -- in [facility]  . Insert / replace / remove pacemaker  2001    pulse generator replacement by Dr. Rollene Fare   . Insert / replace / remove pacemaker  04/01/2008    PPM Medtronic -- model # [id number] serial # [id number] -- pulse generator replacement by Dr. Verlon Setting   . Abdominal hysterectomy    . Basal cell carcinoma excision    . Squamous cell carcinoma excision      History  Smoking status  . Former Smoker  . Quit date: 03/05/1978  Smokeless tobacco  . Never Used    History  Alcohol Use No    Family History  Problem Relation Age of Onset  . Cancer Father     lung cancer  . Alzheimer's disease Mother     Review of Systems: As noted in history of present illness. All other systems were reviewed and are negative.  Physical Exam: BP 134/82 mmHg  Pulse 89  Ht 5\' 5"  (1.651 m)  Wt 128 lb (58.06 kg)  BMI 21.30 kg/m2 Patient is very pleasant and in no acute distress.  Skin is warm and dry. Color is normal.  HEENT is  unremarkable. Normocephalic/atraumatic. PERRL. Sclera are nonicteric. Neck is supple. No masses. No JVD. Lungs are clear. Cardiac exam shows a regular rate and rhythm. Harsh Grade 2/6 systolic murmur RUSB>>apex. Abdomen is soft. Extremities are without edema. Gait and ROM are intact. No gross neurologic deficits noted.  LABORATORY DATA: Echo: 09/14/13:Study Conclusions  - Left ventricle: The cavity size was normal. Wall thickness was normal. Systolic function was normal. The estimated ejection fraction was in the range of 55% to 60%. Wall motion was normal; there were no regional wall motion abnormalities. Doppler parameters are consistent with abnormal left ventricular relaxation (grade 1 diastolic dysfunction). - Aortic valve: Valve mobility was restricted. There was moderate stenosis. There was mild regurgitation. - Mitral valve:  There was moderate regurgitation. - Tricuspid valve: There was moderate regurgitation.  Impressions:  - Normal LV function; moderate MR and TR; moderate AS (mean gradient 24 mmHg); mild AI.     Assessment / Plan: 1. Paroxysmal atrial fibrillation with sick sinus syndrome. Status post pacemaker implant. Episodes of atrial fibrillation with adequate rate control.   Continue long-term anticoagulation with Xarelto. Dose adjusted for age and renal function. Continue current therapy.   2. Aortic stenosis- moderate by Echo. Progressed since 2012. Asymptomatic. Will repeat Echo in July 2016. She is to call if she experiences any cardinal symptoms.   3. HTN- controlled.

## 2014-05-17 ENCOUNTER — Other Ambulatory Visit: Payer: Self-pay | Admitting: Cardiology

## 2014-05-17 NOTE — Telephone Encounter (Signed)
Rx has been sent to the pharmacy electronically. ° °

## 2014-06-05 ENCOUNTER — Other Ambulatory Visit: Payer: Self-pay | Admitting: Cardiology

## 2014-07-21 ENCOUNTER — Telehealth (HOSPITAL_COMMUNITY): Payer: Self-pay | Admitting: *Deleted

## 2014-07-21 DIAGNOSIS — I350 Nonrheumatic aortic (valve) stenosis: Secondary | ICD-10-CM

## 2014-07-21 NOTE — Telephone Encounter (Signed)
Pt called because she believes that she is supposed to have an echo prior to her follow up visit. If so please place order so that I can get her scheduled.  Thanks

## 2014-07-22 NOTE — Telephone Encounter (Signed)
Patient needs echo scheduled before follow up visit with [name].Echo order entered.

## 2014-08-07 NOTE — Progress Notes (Signed)
Electrophysiology Office Note Date: 08/09/2014  ID:  [name] [name], [name] [name] [birth year], MRN [id number]  PCP: [name], MD Primary Cardiologist: Martinique Electrophysiologist: Allred  CC: Pacemaker follow-up  [name] [name] is a 81 y.o. female is seen today for Dr [name]. She presents today for routine electrophysiology followup.  Since last being seen in our clinic, the patient reports doing very well.  She denies chest pain, palpitations, dyspnea, PND, orthopnea, nausea, vomiting, dizziness, syncope.  Device History: MDT dual chamber PPM implanted 1993 for symptomatic bradycardia; gen change 2001; gen change 2010   Past Medical History  Diagnosis Date  . Hyperlipidemia   . Hypertension   . Sick sinus syndrome     s/p PPM (MDT)  . Syncope   . Osteoarthritis   . Paroxysmal atrial fibrillation   . Hypothyroidism   . Aortic stenosis     mild  . GERD (gastroesophageal reflux disease)   . Diverticulosis   . Hemorrhoids    Past Surgical History  Procedure Laterality Date  . Insert / replace / remove pacemaker  1993    for syptomatic bradycardia and syncope -- in [facility]  . Insert / replace / remove pacemaker  2001    pulse generator replacement by Dr. Rollene Fare   . Insert / replace / remove pacemaker  04/01/2008    PPM Medtronic -- model # [id number] serial # [id number] -- pulse generator replacement by Dr. Verlon Setting   . Abdominal hysterectomy    . Basal cell carcinoma excision    . Squamous cell carcinoma excision      Current Outpatient Prescriptions  Medication Sig Dispense Refill  . calcium carbonate (OS-CAL) 600 MG TABS Take 600 mg by mouth daily.      Marland Kitchen CARTIA XT 240 MG 24 hr capsule TAKE 1 CAPSULE BY MOUTH EVERY DAY 90 capsule 2  . Cholecalciferol (VITAMIN D) 1000 UNITS capsule Take 1,000 Units by mouth daily.      Marland Kitchen esomeprazole (NEXIUM) 40 MG capsule Take 40 mg by mouth daily as needed (heartburn).     Marland Kitchen levothyroxine (SYNTHROID, LEVOTHROID) 100 MCG tablet Take  100 mcg by mouth daily.      . metoprolol tartrate (LOPRESSOR) 25 MG tablet TAKE 1 TABLET BY MOUTH TWICE DAILY 180 tablet 0  . XARELTO 15 MG TABS tablet TAKE 1 TABLET BY MOUTH EVERY DAY 90 tablet 3   No current facility-administered medications for this visit.    Allergies:   Codeine; Demerol; Meperidine hcl; Penicillins; and Tetanus toxoid   Social History: History   Social History  . Marital Status: Widowed    Spouse Name: N/A  . Number of Children: 0  . Years of Education: N/A   Occupational History  .     Social History Main Topics  . Smoking status: Former Smoker    Quit date: 03/05/1978  . Smokeless tobacco: Never Used  . Alcohol Use: No  . Drug Use: No  . Sexual Activity: Not on file   Other Topics Concern  . Not on file   Social History Narrative    Family History: Family History  Problem Relation Age of Onset  . Cancer Father     lung cancer  . Alzheimer's disease Mother      Review of Systems: All other systems reviewed and are otherwise negative except as noted above.   Physical Exam: VS:  BP 150/70 mmHg  Pulse 67  Ht 5\' 5"  (1.651 m)  Wt 127 lb 9.6  oz (57.879 kg)  BMI 21.23 kg/m2 , BMI Body mass index is 21.23 kg/(m^2).  GEN- The patient is elderly and thin appearing, alert and oriented x 3 today.   HEENT: normocephalic, atraumatic; sclera clear, conjunctiva pink; hearing intact; oropharynx clear; neck supple, no JVP Lymph- no cervical lymphadenopathy Lungs- Clear to ausculation bilaterally, normal work of breathing.  No wheezes, rales, rhonchi Heart- Regular rate and rhythm, 2/6 SEM GI- soft, non-tender, non-distended, bowel sounds present Extremities- no clubbing, cyanosis, or edema; DP/PT/radial pulses 2+ bilaterally MS- no significant deformity or atrophy Skin- warm and dry, no rash or lesion; PPM pocket well healed Psych- euthymic mood, full affect Neuro- strength and sensation are intact  PPM Interrogation- reviewed in detail today,   See PACEART report  EKG:  EKG is ordered today and demonstrates atrial pacing with intrinsic ventricular conduction, rate 67, normal intervals  Recent Labs: No results found for requested labs within last 365 days.   Wt Readings from Last 3 Encounters:  08/09/14 127 lb 9.6 oz (57.879 kg)  03/15/14 128 lb (58.06 kg)  02/05/14 126 lb 12.8 oz (57.516 kg)     Other studies Reviewed: Additional studies/ records that were reviewed today include: Dr Jackalyn Lombard and Dr Doug Sou office notes  Assessment and Plan:  1.  Symptomatic bradycardia Normal PPM function See Pace Art report No changes today  2.  Paroxysmal atrial fibrillation AF burden by device interrogation today 0% Continue Xarelto for CHADS2VASC of 4 CBC, BMET recently checked by Dr Virgina Jock per patient  3.  Aortic stenosis Scheduled for follow up echo next month  4.  HTN Pt reports SBP 130's at home No changes today   Current medicines are reviewed at length with the patient today.   The patient does not have concerns regarding her medicines.  The following changes were made today:  none  Labs/ tests ordered today include: none   Disposition:   Follow up with echo and Dr Martinique next month as scheduled; follow up with Dr [name] in 6 months - she declines remote monitoring today    Signed, Chanetta Marshall, NP 08/09/2014 11:45 AM  [facility] [address] [place] [address] [phone] (office) [phone] (fax)

## 2014-08-09 ENCOUNTER — Encounter: Payer: Self-pay | Admitting: Nurse Practitioner

## 2014-08-09 ENCOUNTER — Other Ambulatory Visit: Payer: Self-pay | Admitting: Internal Medicine

## 2014-08-09 ENCOUNTER — Ambulatory Visit (INDEPENDENT_AMBULATORY_CARE_PROVIDER_SITE_OTHER): Payer: Medicare Other | Admitting: Nurse Practitioner

## 2014-08-09 VITALS — BP 150/70 | HR 67 | Ht 65.0 in | Wt 127.6 lb

## 2014-08-09 DIAGNOSIS — R001 Bradycardia, unspecified: Secondary | ICD-10-CM

## 2014-08-09 DIAGNOSIS — I480 Paroxysmal atrial fibrillation: Secondary | ICD-10-CM | POA: Diagnosis not present

## 2014-08-09 DIAGNOSIS — I350 Nonrheumatic aortic (valve) stenosis: Secondary | ICD-10-CM

## 2014-08-09 LAB — CUP PACEART INCLINIC DEVICE CHECK
Date Time Interrogation Session: 20160606114920
Lead Channel Setting Pacing Amplitude: 3 V
MDC IDC SET LEADCHNL RA PACING AMPLITUDE: 2 V
MDC IDC SET LEADCHNL RV PACING PULSEWIDTH: 1 ms
MDC IDC SET LEADCHNL RV SENSING SENSITIVITY: 4 mV

## 2014-08-09 NOTE — Patient Instructions (Signed)
Follow up with Dr [name] in 6 months.

## 2014-08-31 ENCOUNTER — Other Ambulatory Visit: Payer: Self-pay | Admitting: Cardiology

## 2014-08-31 NOTE — Telephone Encounter (Signed)
REFILL 

## 2014-09-07 ENCOUNTER — Ambulatory Visit (HOSPITAL_COMMUNITY)
Admission: RE | Admit: 2014-09-07 | Discharge: 2014-09-07 | Disposition: A | Discharge status: Home / Self Care | Payer: Medicare Other | Source: Ambulatory Visit | Attending: Cardiology | Admitting: Cardiology

## 2014-09-07 DIAGNOSIS — I350 Nonrheumatic aortic (valve) stenosis: Secondary | ICD-10-CM

## 2014-09-08 ENCOUNTER — Encounter: Payer: Self-pay | Admitting: Internal Medicine

## 2014-09-13 ENCOUNTER — Ambulatory Visit (INDEPENDENT_AMBULATORY_CARE_PROVIDER_SITE_OTHER): Payer: Medicare Other | Admitting: Cardiology

## 2014-09-13 ENCOUNTER — Encounter: Payer: Self-pay | Admitting: Cardiology

## 2014-09-13 VITALS — BP 142/78 | HR 66 | Ht 65.0 in | Wt 125.1 lb

## 2014-09-13 DIAGNOSIS — I350 Nonrheumatic aortic (valve) stenosis: Secondary | ICD-10-CM

## 2014-09-13 DIAGNOSIS — Z950 Presence of cardiac pacemaker: Secondary | ICD-10-CM | POA: Diagnosis not present

## 2014-09-13 DIAGNOSIS — I480 Paroxysmal atrial fibrillation: Secondary | ICD-10-CM | POA: Diagnosis not present

## 2014-09-13 DIAGNOSIS — I10 Essential (primary) hypertension: Secondary | ICD-10-CM | POA: Diagnosis not present

## 2014-09-13 NOTE — Progress Notes (Signed)
[name] [name] Date of Birth: 01/24/1930 Medical Record #[id number]  History of Present Illness: [name] [name] is seen today for follow up of atrial fibrillation and aortic stenosis. She has a history of paroxysmal atrial fibrillation with tachy-brady syndrome and is s/p pacemaker implant. She is on chronic anticoagulation with Xarelto. Echo in July 2015 showed moderate aortic stenosis that had progressed since 2012. She has been doing very well from a cardiac standpoint. She denies any  dizziness, shortness of breath, or chest pain.  No CVA or TIA symptoms. Her last pacemaker check showed no episodes of Afib. She does complain of constant nasal drainage and read that metoprolol may cause this.  Current Outpatient Prescriptions on File Prior to Visit  Medication Sig Dispense Refill  . calcium carbonate (OS-CAL) 600 MG TABS Take 600 mg by mouth daily.      Marland Kitchen CARTIA XT 240 MG 24 hr capsule TAKE 1 CAPSULE BY MOUTH EVERY DAY 90 capsule 2  . Cholecalciferol (VITAMIN D) 1000 UNITS capsule Take 1,000 Units by mouth daily.      Marland Kitchen esomeprazole (NEXIUM) 40 MG capsule Take 40 mg by mouth daily as needed (heartburn).     Marland Kitchen levothyroxine (SYNTHROID, LEVOTHROID) 100 MCG tablet Take 100 mcg by mouth daily.      . metoprolol tartrate (LOPRESSOR) 25 MG tablet TAKE 1 TABLET BY MOUTH TWICE DAILY 180 tablet 2  . XARELTO 15 MG TABS tablet TAKE 1 TABLET BY MOUTH EVERY DAY 90 tablet 3   No current facility-administered medications on file prior to visit.    Allergies  Allergen Reactions  . Codeine   . Demerol   . Meperidine Hcl   . Penicillins   . Tetanus Toxoid     Past Medical History  Diagnosis Date  . Hyperlipidemia   . Hypertension   . Sick sinus syndrome     s/p PPM (MDT)  . Syncope   . Osteoarthritis   . Paroxysmal atrial fibrillation   . Hypothyroidism   . Aortic stenosis     mild  . GERD (gastroesophageal reflux disease)   . Diverticulosis   . Hemorrhoids     Past Surgical History   Procedure Laterality Date  . Insert / replace / remove pacemaker  1993    for syptomatic bradycardia and syncope -- [facility]  . Insert / replace / remove pacemaker  2001    pulse generator replacement by Dr. Rollene Fare   . Insert / replace / remove pacemaker  04/01/2008    PPM Medtronic -- model # [id number] serial # [id number] -- pulse generator replacement by Dr. Verlon Setting   . Abdominal hysterectomy    . Basal cell carcinoma excision    . Squamous cell carcinoma excision      History  Smoking status  . Former Smoker  . Quit date: 03/05/1978  Smokeless tobacco  . Never Used    History  Alcohol Use No    Family History  Problem Relation Age of Onset  . Cancer Father     lung cancer  . Alzheimer's disease Mother     Review of Systems: As noted in history of present illness. She does complain of pain and weakness in her legs. All other systems were reviewed and are negative.  Physical Exam: BP 142/78 mmHg  Pulse 66  Ht 5\' 5"  (1.651 m)  Wt 56.745 kg (125 lb 1.6 oz)  BMI 20.82 kg/m2 Patient is very pleasant and in no acute distress.  Skin  is warm and dry. Color is normal.  HEENT is unremarkable. Normocephalic/atraumatic. PERRL. Sclera are nonicteric. Neck is supple. No masses. No JVD. Lungs are clear. Cardiac exam shows a regular rate and rhythm. Harsh Grade 2/6 systolic murmur RUSB>>apex. Abdomen is soft. Extremities are without edema. Gait and ROM are intact. No gross neurologic deficits noted.  LABORATORY DATA:  Labs reviewed from Dr. Virgina Jock 07/05/14: Normal CBC and chemistry panel. Chol-244, trig-81, HDL-89, LDL-139. UA normal.   Echo: 09/07/14:  Study Conclusions  - Left ventricle: The cavity size was normal. Systolic function was normal. The estimated ejection fraction was in the range of 60% to 65%. Wall motion was normal; there were no regional wall motion abnormalities. - Aortic valve: Valve mobility was restricted. There was moderate stenosis. There  was mild regurgitation. Peak velocity (S): 323 cm/s. Mean gradient (S): 23 mm Hg. Valve area (VTI): 1.07 cm^2. Valve area (Vmax): 1.02 cm^2. - Mitral valve: Mild prolapse, involving the anterior leaflet. - Right ventricle: The cavity size was mildly dilated. Wall thickness was normal. - Tricuspid valve: There was moderate regurgitation. - Pulmonary arteries: Systolic pressure was mildly increased. PA peak pressure: 34 mm Hg (S).  Impressions:  - When compared to prior echocardiogram, no significant change in aortic stenosis.     Assessment / Plan: 1. Paroxysmal atrial fibrillation with sick sinus syndrome. Status post pacemaker implant. Rare episodes of AFib.  Continue long-term anticoagulation with Xarelto. Dose adjusted for age and renal function. Continue current therapy.   2. Aortic stenosis- moderate by Echo. Progressed since 2012 but no change in last year. Asymptomatic. Will repeat Echo in one year.  She is to call if she experiences any cardinal symptoms.   3. HTN- controlled.  Will try and taper off metoprolol to see if this will help with her nasal drainage. She will monitor her blood pressure at home. Follow up in 6 months.

## 2014-09-13 NOTE — Patient Instructions (Signed)
Reduce metoprolol to 25 mg once a day for 3 days then stop. We will see if this helps with your nasal drainage. Monitor your blood pressure.  Continue your other therapy  I will see you in 6 months.

## 2014-09-14 ENCOUNTER — Encounter: Payer: Self-pay | Admitting: Cardiology

## 2014-09-15 ENCOUNTER — Encounter: Payer: Self-pay | Admitting: Cardiology

## 2014-11-02 ENCOUNTER — Other Ambulatory Visit (HOSPITAL_COMMUNITY): Payer: Self-pay | Admitting: *Deleted

## 2014-11-03 ENCOUNTER — Encounter (HOSPITAL_COMMUNITY)
Admission: RE | Admit: 2014-11-03 | Discharge: 2014-11-03 | Disposition: A | Discharge status: Home / Self Care | Payer: Medicare Other | Source: Ambulatory Visit | Attending: Internal Medicine | Admitting: Internal Medicine

## 2014-11-03 DIAGNOSIS — M810 Age-related osteoporosis without current pathological fracture: Secondary | ICD-10-CM | POA: Insufficient documentation

## 2014-11-03 MED ORDER — DENOSUMAB 60 MG/ML ~~LOC~~ SOLN
60.0000 mg | Freq: Once | SUBCUTANEOUS | Status: AC
  Administered 2014-11-03: 60 mg via SUBCUTANEOUS
  Filled 2014-11-03: qty 1

## 2014-12-09 ENCOUNTER — Other Ambulatory Visit: Payer: Self-pay | Admitting: Internal Medicine

## 2015-02-07 ENCOUNTER — Ambulatory Visit (INDEPENDENT_AMBULATORY_CARE_PROVIDER_SITE_OTHER): Payer: Medicare Other | Admitting: Internal Medicine

## 2015-02-07 ENCOUNTER — Encounter: Payer: Self-pay | Admitting: Internal Medicine

## 2015-02-07 VITALS — BP 142/82 | HR 86 | Ht 65.5 in | Wt 120.0 lb

## 2015-02-07 DIAGNOSIS — I495 Sick sinus syndrome: Secondary | ICD-10-CM

## 2015-02-07 DIAGNOSIS — I480 Paroxysmal atrial fibrillation: Secondary | ICD-10-CM | POA: Diagnosis not present

## 2015-02-07 DIAGNOSIS — Z950 Presence of cardiac pacemaker: Secondary | ICD-10-CM

## 2015-02-07 LAB — CUP PACEART INCLINIC DEVICE CHECK
Battery Impedance: 302 Ohm
Battery Voltage: 2.8 V
Brady Statistic AP VS Percent: 76 %
Brady Statistic AS VP Percent: 0 %
Implantable Lead Implant Date: 19930514
Implantable Lead Location: 753860
Implantable Lead Model: 4261
Implantable Lead Serial Number: 201535
Implantable Lead Serial Number: 3473
Lead Channel Impedance Value: 638 Ohm
Lead Channel Impedance Value: 661 Ohm
Lead Channel Pacing Threshold Pulse Width: 0.4 ms
Lead Channel Sensing Intrinsic Amplitude: 2.8 mV
Lead Channel Setting Pacing Amplitude: 2 V
MDC IDC LEAD IMPLANT DT: 19930514
MDC IDC LEAD LOCATION: 753859
MDC IDC LEAD MODEL: 4271
MDC IDC MSMT BATTERY REMAINING LONGEVITY: 117 mo
MDC IDC MSMT LEADCHNL RA PACING THRESHOLD AMPLITUDE: 0.75 V
MDC IDC MSMT LEADCHNL RV PACING THRESHOLD AMPLITUDE: 1.75 V
MDC IDC MSMT LEADCHNL RV PACING THRESHOLD PULSEWIDTH: 1 ms
MDC IDC MSMT LEADCHNL RV SENSING INTR AMPL: 11.2 mV
MDC IDC SESS DTM: 20161205141917
MDC IDC SET LEADCHNL RV PACING AMPLITUDE: 3.5 V
MDC IDC SET LEADCHNL RV PACING PULSEWIDTH: 1 ms
MDC IDC SET LEADCHNL RV SENSING SENSITIVITY: 4 mV
MDC IDC STAT BRADY AP VP PERCENT: 0 %
MDC IDC STAT BRADY AS VS PERCENT: 24 %

## 2015-02-07 NOTE — Progress Notes (Signed)
PCP: [name], MD Primary Cardiologist:  Dr Martinique  [name] [name] is a 81 y.o. female who presents today for routine electrophysiology followup.  Since last being seen in our clinic, the patient reports doing very well.  She remains very active.   Her afib is well controlled (only 1 episodes < 24 hours in past year).  Today, she denies symptoms of palpitations, chest pain, dizziness, presyncope, or syncope.  She has mild SOB which is stable.   The patient is otherwise without complaint today.   Past Medical History  Diagnosis Date  . Hyperlipidemia   . Hypertension   . Sick sinus syndrome (HCC)     s/p PPM (MDT)  . Syncope   . Osteoarthritis   . Paroxysmal atrial fibrillation (HCC)   . Hypothyroidism   . Aortic stenosis     mild  . GERD (gastroesophageal reflux disease)   . Diverticulosis   . Hemorrhoids    Past Surgical History  Procedure Laterality Date  . Insert / replace / remove pacemaker  1993    for syptomatic bradycardia and syncope -- in [facility]  . Insert / replace / remove pacemaker  2001    pulse generator replacement by Dr. Rollene Fare   . Insert / replace / remove pacemaker  04/01/2008    PPM Medtronic -- model # [id number] serial # [id number] -- pulse generator replacement by Dr. Verlon Setting   . Abdominal hysterectomy    . Basal cell carcinoma excision    . Squamous cell carcinoma excision      Current Outpatient Prescriptions  Medication Sig Dispense Refill  . calcium carbonate (OS-CAL) 600 MG TABS Take 600 mg by mouth daily.      Marland Kitchen CARTIA XT 240 MG 24 hr capsule TAKE 1 CAPSULE BY MOUTH EVERY DAY 90 capsule 2  . Cholecalciferol (VITAMIN D) 1000 UNITS capsule Take 1,000 Units by mouth daily.      Marland Kitchen esomeprazole (NEXIUM) 40 MG capsule Take 40 mg by mouth daily as needed (heartburn).     Marland Kitchen levothyroxine (SYNTHROID, LEVOTHROID) 100 MCG tablet Take 100 mcg by mouth daily.      . metoprolol tartrate (LOPRESSOR) 25 MG tablet TAKE 1 TABLET BY MOUTH TWICE DAILY 180  tablet 2  . XARELTO 15 MG TABS tablet TAKE 1 TABLET BY MOUTH EVERY DAY 90 tablet 0   No current facility-administered medications for this visit.    Physical Exam: Filed Vitals:   02/07/15 0911  BP: 142/82  Pulse: 86  Height: 5' 5.5" (1.664 m)  Weight: 120 lb (54.432 kg)    GEN- The patient is well appearing, alert and oriented x 3 today.   Head- normocephalic, atraumatic Eyes-  Sclera clear, conjunctiva pink Ears- hearing intact Oropharynx- clear Lungs- Clear to ausculation bilaterally, normal work of breathing Chest- pacemaker pocket is well healed Heart- Regular rate and rhythm, 2/6 SEM LUSB (mid peaking) GI- soft, NT, ND, + BS Extremities- no clubbing, cyanosis, or edema  Pacemaker interrogation- reviewed in detail today,  See PACEART report  Assessment and Plan: BRADYCARDIA-TACHYCARDIA SYNDROME   Normal pacemaker function  See Pace Art report  No changes today  RV threshold is chronically elevated though she does not RV pace She is now willing to try remote monitoring  ATRIAL FIBRILLATION  Continue xarelto Consider AAD if her afib burden increases  AS Stable  Followed by Dr Martinique  Follow-up with Dr Martinique as scheduled Remote monitroing I will see again in 1year  [name] Rinks Kenshawn Maciolek  MD, [facility] 02/07/2015 10:12 AM

## 2015-02-07 NOTE — Patient Instructions (Addendum)
Medication Instructions:  Your physician recommends that you continue on your current medications as directed. Please refer to the Current Medication list given to you today.   Labwork: None ordered   Testing/Procedures: None ordered   Follow-Up: Your physician wants you to follow-up in:  12 months with Dr [name] Dennis Bast will receive a reminder letter in the mail two months in advance. If you don't receive a letter, please call our office to schedule the follow-up appointment.  Remote monitoring is used to monitor your Pacemaker from home. This monitoring reduces the number of office visits required to check your device to one time per year. It allows [place] to keep an eye on the functioning of your device to ensure it is working properly. You are scheduled for a device check from home on 05/09/15. You may send your transmission at any time that day. If you have a wireless device, the transmission will be sent automatically. After your physician reviews your transmission, you will receive a postcard with your next transmission date.     Any Other Special Instructions Will Be Listed Below (If Applicable).     If you need a refill on your cardiac medications before your next appointment, please call your pharmacy.

## 2015-02-17 ENCOUNTER — Other Ambulatory Visit: Payer: Self-pay | Admitting: Cardiology

## 2015-02-18 NOTE — Telephone Encounter (Signed)
Rx(s) sent to pharmacy electronically.  

## 2015-03-02 ENCOUNTER — Other Ambulatory Visit: Payer: Self-pay | Admitting: Internal Medicine

## 2015-03-28 ENCOUNTER — Ambulatory Visit (INDEPENDENT_AMBULATORY_CARE_PROVIDER_SITE_OTHER): Payer: Medicare Other | Admitting: Cardiology

## 2015-03-28 ENCOUNTER — Encounter: Payer: Self-pay | Admitting: Cardiology

## 2015-03-28 VITALS — BP 130/72 | HR 70 | Ht 65.5 in | Wt 121.0 lb

## 2015-03-28 DIAGNOSIS — I10 Essential (primary) hypertension: Secondary | ICD-10-CM | POA: Diagnosis not present

## 2015-03-28 DIAGNOSIS — I480 Paroxysmal atrial fibrillation: Secondary | ICD-10-CM

## 2015-03-28 DIAGNOSIS — I350 Nonrheumatic aortic (valve) stenosis: Secondary | ICD-10-CM | POA: Diagnosis not present

## 2015-03-28 DIAGNOSIS — Z950 Presence of cardiac pacemaker: Secondary | ICD-10-CM

## 2015-03-28 NOTE — Progress Notes (Signed)
[name] Date of Birth: 04-20-1929 Medical Record [id number]  History of Present Illness: Ms. Aceituno is seen today for follow up of atrial fibrillation and moderate aortic stenosis. She has a history of paroxysmal atrial fibrillation with tachy-brady syndrome and is s/p pacemaker implant. She is on chronic anticoagulation with Xarelto. Echo in July 2015 showed moderate aortic stenosis that had progressed since 2012. Repeat in July 2016 showed no change. She has been doing very well from a cardiac standpoint. She denies any  dizziness, shortness of breath, or chest pain.  No CVA or TIA symptoms. Her last pacemaker check showed only 1  episode of Afib. She notes rare symptom of flutter that catches her with physical activity.  Current Outpatient Prescriptions on File Prior to Visit  Medication Sig Dispense Refill  . calcium carbonate (OS-CAL) 600 MG TABS Take 600 mg by mouth daily.      Marland Kitchen CARTIA XT 240 MG 24 hr capsule TAKE 1 CAPSULE BY MOUTH EVERY DAY 90 capsule 2  . Cholecalciferol (VITAMIN D) 1000 UNITS capsule Take 1,000 Units by mouth daily.      Marland Kitchen esomeprazole (NEXIUM) 40 MG capsule Take 40 mg by mouth daily as needed (heartburn).     Marland Kitchen levothyroxine (SYNTHROID, LEVOTHROID) 100 MCG tablet Take 100 mcg by mouth daily.      . metoprolol tartrate (LOPRESSOR) 25 MG tablet TAKE 1 TABLET BY MOUTH TWICE DAILY 180 tablet 2  . XARELTO 15 MG TABS tablet TAKE 1 TABLET BY MOUTH EVERY DAY 90 tablet 3   No current facility-administered medications on file prior to visit.    Allergies  Allergen Reactions  . Penicillins Anaphylaxis  . Tetanus Toxoid Anaphylaxis  . Codeine     Severe nausea  . Demerol     Severe nausea  . Meperidine Hcl     unknown    Past Medical History  Diagnosis Date  . Hyperlipidemia   . Hypertension   . Sick sinus syndrome (HCC)     s/p PPM (MDT)  . Syncope   . Osteoarthritis   . Paroxysmal atrial fibrillation (HCC)   . Hypothyroidism   . Aortic stenosis      mild  . GERD (gastroesophageal reflux disease)   . Diverticulosis   . Hemorrhoids     Past Surgical History  Procedure Laterality Date  . Insert / replace / remove pacemaker  1993    for syptomatic bradycardia and syncope -- in [facility]  . Insert / replace / remove pacemaker  2001    pulse generator replacement by Dr. Rollene Fare   . Insert / replace / remove pacemaker  04/01/2008    PPM Medtronic -- model # [id number] serial # [id number] -- pulse generator replacement by Dr. Verlon Setting   . Abdominal hysterectomy    . Basal cell carcinoma excision    . Squamous cell carcinoma excision      History  Smoking status  . Former Smoker  . Quit date: 03/05/1978  Smokeless tobacco  . Never Used    History  Alcohol Use No    Family History  Problem Relation Age of Onset  . Cancer Father     lung cancer  . Alzheimer's disease Mother     Review of Systems: As noted in history of present illness.  All other systems were reviewed and are negative.  Physical Exam: BP 130/72 mmHg  Pulse 70  Ht 5' 5.5" (1.664 m)  Wt 54.885 kg (121 lb)  BMI  19.82 kg/m2 Patient is very pleasant and in no acute distress.  Skin is warm and dry. Color is normal.  HEENT is unremarkable. Normocephalic/atraumatic. PERRL. Sclera are nonicteric. Neck is supple. No masses. No JVD. Lungs are clear. Cardiac exam shows a regular rate and rhythm. Harsh Grade 2/6 systolic murmur RUSB>>apex. Abdomen is soft. Extremities are without edema. Gait and ROM are intact. No gross neurologic deficits noted.  LABORATORY DATA:  Echo: 09/07/14:  Study Conclusions  - Left ventricle: The cavity size was normal. Systolic function was normal. The estimated ejection fraction was in the range of 60% to 65%. Wall motion was normal; there were no regional wall motion abnormalities. - Aortic valve: Valve mobility was restricted. There was moderate stenosis. There was mild regurgitation. Peak velocity (S): 323 cm/s. Mean  gradient (S): 23 mm Hg. Valve area (VTI): 1.07 cm^2. Valve area (Vmax): 1.02 cm^2. - Mitral valve: Mild prolapse, involving the anterior leaflet. - Right ventricle: The cavity size was mildly dilated. Wall thickness was normal. - Tricuspid valve: There was moderate regurgitation. - Pulmonary arteries: Systolic pressure was mildly increased. PA peak pressure: 34 mm Hg (S).  Impressions:  - When compared to prior echocardiogram, no significant change in aortic stenosis.     Assessment / Plan: 1. Paroxysmal atrial fibrillation with sick sinus syndrome. Status post pacemaker implant. Rare episodes of AFib.  Continue long-term anticoagulation with Xarelto. Dose adjusted for age and renal function. Continue current therapy.   2. Aortic stenosis- moderate by Echo. Progressed since 2012 but no change in last year. Asymptomatic. Will repeat Echo in July 2017.    3. HTN- controlled on diltiazem.

## 2015-03-28 NOTE — Patient Instructions (Signed)
Continue your current therapy  We will check an Echo with your next visit in 6 months.

## 2015-04-27 ENCOUNTER — Other Ambulatory Visit (HOSPITAL_COMMUNITY): Payer: Self-pay | Admitting: *Deleted

## 2015-04-28 ENCOUNTER — Ambulatory Visit (HOSPITAL_COMMUNITY)
Admission: RE | Admit: 2015-04-28 | Discharge: 2015-04-28 | Disposition: A | Discharge status: Home / Self Care | Payer: Medicare Other | Source: Ambulatory Visit | Attending: Internal Medicine | Admitting: Internal Medicine

## 2015-04-28 DIAGNOSIS — M810 Age-related osteoporosis without current pathological fracture: Secondary | ICD-10-CM | POA: Insufficient documentation

## 2015-04-28 MED ORDER — DENOSUMAB 60 MG/ML ~~LOC~~ SOLN
60.0000 mg | Freq: Once | SUBCUTANEOUS | Status: AC
  Administered 2015-04-28: 60 mg via SUBCUTANEOUS
  Filled 2015-04-28: qty 1

## 2015-05-03 ENCOUNTER — Telehealth: Payer: Self-pay | Admitting: Internal Medicine

## 2015-05-03 NOTE — Telephone Encounter (Signed)
New Message:  Pt called in stating that last night she had an episode of Afib and she took and extra Metoprolol as instructed. She says that her symptoms have improved but now its time to take her regular meds and she would like to know how she should take her Metoprolol . Please f/u

## 2015-05-03 NOTE — Telephone Encounter (Signed)
Pt called because when she was seen by Dr. [name] was instructed to take an extra Metoprolol 25 mg if she goes out of rhythm. Pt states he went out of rhythm yesterday afternoon, so it was time for her to take her scheduled dose of Metoprolol 25 mg. Pt   took the medication it helped to go back on rhythm. this AM about 3 Am pt went out of rhythm again, then she took the extra dose of Metoprolol as instructed by Dr. [name]. Pt would like to know how she can take her Metoprolol medication today.  Pt was advised that she can continue taken  her scheduled dose as before. Pt is aware that if she goes out of  Rhythm again today she is to call the office. Pt is aware that she can take an extra dose of metoprolol once a day if Arrhythmia occurs. Pt verbalized understanding.

## 2015-05-09 ENCOUNTER — Ambulatory Visit: Payer: Medicare Other | Admitting: *Deleted

## 2015-05-13 ENCOUNTER — Encounter: Payer: Self-pay | Admitting: Cardiology

## 2015-05-18 ENCOUNTER — Ambulatory Visit (INDEPENDENT_AMBULATORY_CARE_PROVIDER_SITE_OTHER): Payer: Medicare Other | Admitting: *Deleted

## 2015-05-18 DIAGNOSIS — I495 Sick sinus syndrome: Secondary | ICD-10-CM | POA: Diagnosis not present

## 2015-05-29 ENCOUNTER — Other Ambulatory Visit: Payer: Self-pay | Admitting: Cardiology

## 2015-05-30 ENCOUNTER — Telehealth: Payer: Self-pay | Admitting: Cardiology

## 2015-05-30 NOTE — Telephone Encounter (Signed)
Prednisone does not interact with Xarelto but could increase risk of GI bleeding. If she is just on a dose pack there is probably not a significant risk. For longer term steroids we might place on GI prophylaxis.  Markon Jares Martinique MD, [facility]

## 2015-05-30 NOTE — Telephone Encounter (Signed)
New Message  Pt requested to speak w/ RN concerning her blood thinner- Xarelto.- Please call back and discuss.

## 2015-05-30 NOTE — Telephone Encounter (Signed)
Returned call to patient.[name]'s recommendations given.Advised to call back if needed. 

## 2015-05-30 NOTE — Telephone Encounter (Signed)
Returned call to patient.She stated her orthopedic Dr.has prescribed prednisone dose pack for a Baker's cyst behind right knee.Stated she was told by pharmacist to check with [name] since she is on xarelto may increase risk of bleeding and bruising.Message sent to [place] for advice.

## 2015-06-06 ENCOUNTER — Emergency Department (HOSPITAL_COMMUNITY)
Admission: EM | Admit: 2015-06-06 | Discharge: 2015-06-06 | Disposition: A | Discharge status: Home / Self Care | Payer: Medicare Other | Attending: Emergency Medicine | Admitting: Emergency Medicine

## 2015-06-06 ENCOUNTER — Emergency Department (HOSPITAL_COMMUNITY): Payer: Medicare Other

## 2015-06-06 ENCOUNTER — Encounter (HOSPITAL_COMMUNITY): Payer: Self-pay | Admitting: Nurse Practitioner

## 2015-06-06 ENCOUNTER — Telehealth: Payer: Self-pay | Admitting: Cardiology

## 2015-06-06 DIAGNOSIS — Z7901 Long term (current) use of anticoagulants: Secondary | ICD-10-CM | POA: Insufficient documentation

## 2015-06-06 DIAGNOSIS — Z87891 Personal history of nicotine dependence: Secondary | ICD-10-CM | POA: Insufficient documentation

## 2015-06-06 DIAGNOSIS — I4891 Unspecified atrial fibrillation: Secondary | ICD-10-CM

## 2015-06-06 DIAGNOSIS — E039 Hypothyroidism, unspecified: Secondary | ICD-10-CM | POA: Diagnosis not present

## 2015-06-06 DIAGNOSIS — Z79899 Other long term (current) drug therapy: Secondary | ICD-10-CM | POA: Insufficient documentation

## 2015-06-06 DIAGNOSIS — Z8739 Personal history of other diseases of the musculoskeletal system and connective tissue: Secondary | ICD-10-CM | POA: Insufficient documentation

## 2015-06-06 DIAGNOSIS — K219 Gastro-esophageal reflux disease without esophagitis: Secondary | ICD-10-CM | POA: Insufficient documentation

## 2015-06-06 DIAGNOSIS — R002 Palpitations: Secondary | ICD-10-CM | POA: Diagnosis present

## 2015-06-06 DIAGNOSIS — I10 Essential (primary) hypertension: Secondary | ICD-10-CM | POA: Diagnosis not present

## 2015-06-06 DIAGNOSIS — Z880 Allergy status to penicillin: Secondary | ICD-10-CM | POA: Diagnosis not present

## 2015-06-06 DIAGNOSIS — Z950 Presence of cardiac pacemaker: Secondary | ICD-10-CM | POA: Insufficient documentation

## 2015-06-06 LAB — BASIC METABOLIC PANEL
ANION GAP: 11 (ref 5–15)
BUN: 31 mg/dL — ABNORMAL HIGH (ref 6–20)
CHLORIDE: 99 mmol/L — AB (ref 101–111)
CO2: 26 mmol/L (ref 22–32)
Calcium: 9.5 mg/dL (ref 8.9–10.3)
Creatinine, Ser: 1.38 mg/dL — ABNORMAL HIGH (ref 0.44–1.00)
GFR calc non Af Amer: 34 mL/min — ABNORMAL LOW (ref >60)
GFR, EST AFRICAN AMERICAN: 40 mL/min — AB (ref >60)
Glucose, Bld: 122 mg/dL — ABNORMAL HIGH (ref 65–99)
POTASSIUM: 4.3 mmol/L (ref 3.5–5.1)
SODIUM: 136 mmol/L (ref 135–145)

## 2015-06-06 LAB — CBC
HCT: 43.4 % (ref 36.0–46.0)
HEMOGLOBIN: 14.5 g/dL (ref 12.0–15.0)
MCH: 34.4 pg — AB (ref 26.0–34.0)
MCHC: 33.4 g/dL (ref 30.0–36.0)
MCV: 102.8 fL — AB (ref 78.0–100.0)
Platelets: 245 10*3/uL (ref 150–400)
RBC: 4.22 MIL/uL (ref 3.87–5.11)
RDW: 13.6 % (ref 11.5–15.5)
WBC: 8.3 10*3/uL (ref 4.0–10.5)

## 2015-06-06 LAB — I-STAT TROPONIN, ED: Troponin i, poc: 0.03 ng/mL (ref 0.00–0.08)

## 2015-06-06 MED ORDER — DILTIAZEM HCL 25 MG/5ML IV SOLN
20.0000 mg | Freq: Once | INTRAVENOUS | Status: AC
  Administered 2015-06-06: 20 mg via INTRAVENOUS
  Filled 2015-06-06: qty 5

## 2015-06-06 MED ORDER — METOPROLOL TARTRATE 1 MG/ML IV SOLN
5.0000 mg | Freq: Once | INTRAVENOUS | Status: AC
  Administered 2015-06-06: 5 mg via INTRAVENOUS
  Filled 2015-06-06: qty 5

## 2015-06-06 NOTE — ED Notes (Signed)
Patient able to ambulate independently  

## 2015-06-06 NOTE — Telephone Encounter (Signed)
New message  Patient c/o Palpitations:  High priority if patient c/o lightheadedness and shortness of breath.  1. How long have you been having palpitations? 06/05/15  2. Are you currently experiencing lightheadedness and shortness of breath? NO 3. Have you checked your BP and heart rate? (document readings) NO  4. Are you experiencing any other symptoms? NO,

## 2015-06-06 NOTE — Telephone Encounter (Signed)
Agree. She rarely has Afib and it usually converts on its own.  Sydne Krahl Martinique MD, [facility]

## 2015-06-06 NOTE — ED Provider Notes (Signed)
CSN: [id number]     Arrival date & time 06/06/15  1555 History   First MD Initiated Contact with Patient 06/06/15 2050     Chief Complaint  Patient presents with  . Atrial Fibrillation     (Consider location/radiation/quality/duration/timing/severity/associated sxs/prior Treatment) Patient is a 82 y.o. female presenting with atrial fibrillation. The history is provided by the patient.  Atrial Fibrillation Pertinent negatives include no chest pain, no abdominal pain, no headaches and no shortness of breath.  Patient w hx afib, presents c/o palpitations onset yesterday evening, at rest. States feels same as prior episodes afib. Is on xarelto. Denies recent change in meds, except for recent addition prednisone for bakers cyst. Denies chest pain or discomfort. No sob. No syncope. Denies headache. No cough or uri c/o. No abd pain. No nvd. No dysuria or gu c/o. No fever or chills.      Past Medical History  Diagnosis Date  . Hyperlipidemia   . Hypertension   . Sick sinus syndrome (HCC)     s/p PPM (MDT)  . Syncope   . Osteoarthritis   . Paroxysmal atrial fibrillation (HCC)   . Hypothyroidism   . Aortic stenosis     mild  . GERD (gastroesophageal reflux disease)   . Diverticulosis   . Hemorrhoids   . Aortic stenosis    Past Surgical History  Procedure Laterality Date  . Insert / replace / remove pacemaker  1993    for syptomatic bradycardia and syncope -- in [facility]  . Insert / replace / remove pacemaker  2001    pulse generator replacement by Dr. Rollene Fare   . Insert / replace / remove pacemaker  04/01/2008    PPM [employer] -- model # [id number] serial # [id number] -- pulse generator replacement by Dr. Verlon Setting   . Abdominal hysterectomy    . Basal cell carcinoma excision    . Squamous cell carcinoma excision     Family History  Problem Relation Age of Onset  . Cancer Father     lung cancer  . Alzheimer's disease Mother    Social History  Substance Use Topics  .  Smoking status: Former Smoker    Quit date: 03/05/1978  . Smokeless tobacco: Never Used  . Alcohol Use: No   OB History    No data available     Review of Systems  Constitutional: Negative for fever and chills.  HENT: Negative for sore throat.   Eyes: Negative for redness.  Respiratory: Negative for shortness of breath.   Cardiovascular: Positive for palpitations. Negative for chest pain and leg swelling.  Gastrointestinal: Negative for vomiting, abdominal pain and diarrhea.  Genitourinary: Negative for flank pain.  Musculoskeletal: Negative for back pain and neck pain.  Skin: Negative for rash.  Neurological: Negative for headaches.  Hematological: Does not bruise/bleed easily.  Psychiatric/Behavioral: Negative for confusion.      Allergies  Penicillins; Tetanus toxoid; Codeine; Demerol; and Meperidine hcl  Home Medications   Prior to Admission medications   Medication Sig Start Date End Date Taking? Authorizing Provider  calcium carbonate (OS-CAL) 600 MG TABS Take 600 mg by mouth daily.      Historical Provider, MD  CARTIA XT 240 MG 24 hr capsule TAKE 1 CAPSULE BY MOUTH EVERY DAY 02/18/15   Peter M Martinique, MD  Cholecalciferol (VITAMIN D) 1000 UNITS capsule Take 1,000 Units by mouth daily.      Historical Provider, MD  esomeprazole (NEXIUM) 40 MG capsule Take 40 mg by mouth  daily as needed (heartburn).     Historical Provider, MD  levothyroxine (SYNTHROID, LEVOTHROID) 100 MCG tablet Take 100 mcg by mouth daily.      Historical Provider, MD  metoprolol tartrate (LOPRESSOR) 25 MG tablet TAKE 1 TABLET BY MOUTH TWICE DAILY 05/30/15   Peter M Martinique, MD  XARELTO 15 MG TABS tablet TAKE 1 TABLET BY MOUTH EVERY DAY 03/02/15   Thompson Grayer, MD   BP 120/76 mmHg  Pulse 66  Temp(Src) 97.5 F (36.4 C) (Oral)  Resp 17  SpO2 99% Physical Exam  Constitutional: She is oriented to person, place, and time. She appears well-developed and well-nourished. No distress.  HENT:   Mouth/Throat: Oropharynx is clear and moist.  Eyes: Conjunctivae are normal. No scleral icterus.  Neck: Neck supple. No tracheal deviation present.  Cardiovascular: Normal heart sounds and intact distal pulses.   No murmur heard. Irregular rhythm. Tachycardic.   Pulmonary/Chest: Effort normal and breath sounds normal. No respiratory distress.  Abdominal: Soft. Normal appearance and bowel sounds are normal. She exhibits no distension. There is no tenderness.  Genitourinary:  No cva tenderness  Musculoskeletal: She exhibits no edema or tenderness.  Neurological: She is alert and oriented to person, place, and time.  Skin: Skin is warm and dry. No rash noted. She is not diaphoretic.  Psychiatric: She has a normal mood and affect.  Nursing note and vitals reviewed.   ED Course  Procedures (including critical care time) Labs Review Labs Reviewed  BASIC METABOLIC PANEL - Abnormal; Notable for the following:    Chloride 99 (*)    Glucose, Bld 122 (*)    BUN 31 (*)    Creatinine, Ser 1.38 (*)    GFR calc non Af Amer 34 (*)    GFR calc Af Amer 40 (*)    All other components within normal limits  CBC - Abnormal; Notable for the following:    MCV 102.8 (*)    MCH 34.4 (*)    All other components within normal limits  I-STAT TROPOININ, ED    Imaging Review Dg Chest 2 View  06/06/2015  CLINICAL DATA:  Onset of atrial fibrillation during the night last night, fluttering woke her up, symptoms persisted after metoprolol, some shortness of breath, weakness, history hypertension, former smoker EXAM: CHEST  2 VIEW COMPARISON:  CT chest 09/18/2012 FINDINGS: RIGHT subclavian transvenous pacemaker leads project at RIGHT atrium and RIGHT ventricle. Upper normal heart size. Atherosclerotic calcification thoracic aorta. Pulmonary vascularity normal. Emphysematous and bronchitic changes consistent with COPD. Minimal atelectasis at lateral RIGHT costophrenic angle. Minimal atelectasis in lingula adjacent  to LEFT heart border unchanged from prior CT. No acute infiltrate, pleural effusion, or pneumothorax. Diffuse osseous demineralization. IMPRESSION: COPD changes with minimal bibasilar atelectasis. No acute abnormalities. Electronically Signed   By: Lavonia [name] M.D.   On: 06/06/2015 16:42   I have personally reviewed and evaluated these images and lab results as part of my medical decision-making.   EKG Interpretation   Date/Time:  Monday June 06 2015 16:04:13 EDT Ventricular Rate:  107 PR Interval:    QRS Duration: 76 QT Interval:  314 QTC Calculation: 419 R Axis:   66 Text Interpretation:  Atrial fibrillation with rapid ventricular response  Abnormal QRS-T angle, consider primary T wave abnormality Abnormal ECG  Since last tracing rate faster pacing spikes not seen today Abnormal ekg  Confirmed by MILLER  MD, BRIAN ([id number]) on 06/06/2015 4:13:13 PM      MDM   Iv ns.  Continuous pulse ox and monitor.  Labs.  Pt indicates has not yet had her evening meds, including metoprolol. Metoprolol iv.   Reviewed nursing notes and prior charts for additional history.   Discussed pt with Dr Einar Crow, on call for cardiology - he reviewed notes, indicates as on xarelto, and rate controlled, to d/c to home, they will follow up closely in office, indicates patient can take extra po dose of her cartia tonight.  Recheck pt.  Afib, approx 1/2 beats sinus. Rate remains controlled. No cp, no sob. bp normal.   Pt currently appears stable for d/c.     Lajean Saver, MD 06/06/15 2243

## 2015-06-06 NOTE — Telephone Encounter (Signed)
Returned call to patient.Stated she woke up early this morning with AFib.Stated she took a extra metoprolol 25 mg 1/2 tablet.Stated she is still in AFib.Advised she can take another 25 mg 1/2 tablet now.Advised to go to ER if she continues to be in AFib.

## 2015-06-06 NOTE — ED Notes (Signed)
MD at bedside. 

## 2015-06-06 NOTE — Discharge Instructions (Signed)
It was our pleasure to provide your ER care today - we hope that you feel better.  Rest. Drink adequate fluids.  Take a dose of your cartia tonight.  Follow up with your cardiologist tomorrow, call office tomorrow morning to arrange appointment.  Return to ER if worse, new symptoms, trouble breathing, persistent fast heart beat, weak/faint, chest pain, other concern.     Atrial Fibrillation Atrial fibrillation is a type of irregular or rapid heartbeat (arrhythmia). In atrial fibrillation, the heart quivers continuously in a chaotic pattern. This occurs when parts of the heart receive disorganized signals that make the heart unable to pump blood normally. This can increase the risk for stroke, heart failure, and other heart-related conditions. There are different types of atrial fibrillation, including:  Paroxysmal atrial fibrillation. This type starts suddenly, and it usually stops on its own shortly after it starts.  Persistent atrial fibrillation. This type often lasts longer than a week. It may stop on its own or with treatment.  Long-lasting persistent atrial fibrillation. This type lasts longer than 12 months.  Permanent atrial fibrillation. This type does not go away. Talk with your health care provider to learn about the type of atrial fibrillation that you have. CAUSES This condition is caused by some heart-related conditions or procedures, including:  A heart attack.  Coronary artery disease.  Heart failure.  Heart valve conditions.  High blood pressure.  Inflammation of the sac that surrounds the heart (pericarditis).  Heart surgery.  Certain heart rhythm disorders, such as Wolf-Parkinson-White syndrome. Other causes include:  Pneumonia.  Obstructive sleep apnea.  Blockage of an artery in the lungs (pulmonary embolism, or PE).  Lung cancer.  Chronic lung disease.  Thyroid problems, especially if the thyroid is overactive  (hyperthyroidism).  Caffeine.  Excessive alcohol use or illegal drug use.  Use of some medicines, including certain decongestants and diet pills. Sometimes, the cause cannot be found. RISK FACTORS This condition is more likely to develop in:  People who are older in age.  People who smoke.  People who have diabetes mellitus.  People who are overweight (obese).  Athletes who exercise vigorously. SYMPTOMS Symptoms of this condition include:  A feeling that your heart is beating rapidly or irregularly.  A feeling of discomfort or pain in your chest.  Shortness of breath.  Sudden light-headedness or weakness.  Getting tired easily during exercise. In some cases, there are no symptoms. DIAGNOSIS Your health care provider may be able to detect atrial fibrillation when taking your pulse. If detected, this condition may be diagnosed with:  An electrocardiogram (ECG).  A Holter monitor test that records your heartbeat patterns over a 24-hour period.  Transthoracic echocardiogram (TTE) to evaluate how blood flows through your heart.  Transesophageal echocardiogram (TEE) to view more detailed images of your heart.  A stress test.  Imaging tests, such as a CT scan or chest X-ray.  Blood tests. TREATMENT The main goals of treatment are to prevent blood clots from forming and to keep your heart beating at a normal rate and rhythm. The type of treatment that you receive depends on many factors, such as your underlying medical conditions and how you feel when you are experiencing atrial fibrillation. This condition may be treated with:  Medicine to slow down the heart rate, bring the heart's rhythm back to normal, or prevent clots from forming.  Electrical cardioversion. This is a procedure that resets your heart's rhythm by delivering a controlled, low-energy shock to the heart through  your skin.  Different types of ablation, such as catheter ablation, catheter ablation with  pacemaker, or surgical ablation. These procedures destroy the heart tissues that send abnormal signals. When the pacemaker is used, it is placed under your skin to help your heart beat in a regular rhythm. HOME CARE INSTRUCTIONS  Take over-the counter and prescription medicines only as told by your health care provider.  If your health care provider prescribed a blood-thinning medicine (anticoagulant), take it exactly as told. Taking too much blood-thinning medicine can cause bleeding. If you do not take enough blood-thinning medicine, you will not have the protection that you need against stroke and other problems.  Do not use tobacco products, including cigarettes, chewing tobacco, and e-cigarettes. If you need help quitting, ask your health care provider.  If you have obstructive sleep apnea, manage your condition as told by your health care provider.  Do not drink alcohol.  Do not drink beverages that contain caffeine, such as coffee, soda, and tea.  Maintain a healthy weight. Do not use diet pills unless your health care provider approves. Diet pills may make heart problems worse.  Follow diet instructions as told by your health care provider.  Exercise regularly as told by your health care provider.  Keep all follow-up visits as told by your health care provider. This is important. PREVENTION  Avoid drinking beverages that contain caffeine or alcohol.  Avoid certain medicines, especially medicines that are used for breathing problems.  Avoid certain herbs and herbal medicines, such as those that contain ephedra or ginseng.  Do not use illegal drugs, such as cocaine and amphetamines.  Do not smoke.  Manage your high blood pressure. SEEK MEDICAL CARE IF:  You notice a change in the rate, rhythm, or strength of your heartbeat.  You are taking an anticoagulant and you notice increased bruising.  You tire more easily when you exercise or exert yourself. SEEK IMMEDIATE  MEDICAL CARE IF:  You have chest pain, abdominal pain, sweating, or weakness.  You feel nauseous.  You notice blood in your vomit, bowel movement, or urine.  You have shortness of breath.  You suddenly have swollen feet and ankles.  You feel dizzy.  You have sudden weakness or numbness of the face, arm, or leg, especially on one side of the body.  You have trouble speaking, trouble understanding, or both (aphasia).  Your face or your eyelid droops on one side. These symptoms may represent a serious problem that is an emergency. Do not wait to see if the symptoms will go away. Get medical help right away. Call your local emergency services (911 in the [place] Do not drive yourself to the hospital.   This information is not intended to replace advice given to you by your health care provider. Make sure you discuss any questions you have with your health care provider.   Document Released: 02/19/2005 Document Revised: 11/10/2014 Document Reviewed: 06/16/2014 Elsevier Interactive Patient Education [employer].

## 2015-06-06 NOTE — ED Notes (Signed)
She reports onset atrial fibrillation during the night last night. The "Fluttering" woke her up and she took an extra dose of her metoprolol but symptoms persisted. shes had some mild SOB and "feels weak." A&Ox4, resp e/u

## 2015-06-07 ENCOUNTER — Telehealth: Payer: Self-pay | Admitting: Cardiology

## 2015-06-07 ENCOUNTER — Telehealth: Payer: Self-pay

## 2015-06-07 NOTE — Telephone Encounter (Signed)
Spoke to patient.[name] advised continue increased dose of Cartia.Appointment scheduled with Truitt Merle NP tomorrow 06/08/15 at 3:00 pm at [facility] office.Cardioversion to be scheduled later this week if AFib persists since you are already on Xarelto.

## 2015-06-07 NOTE — Telephone Encounter (Signed)
Spoke to patient she stated she was told to call [name] this morning to report how she was doing.Stated she went to ER yesterday received 2 different IV medications.She was sent home still in AFib was told to take a extra Cartia 240 mg when she got home.Stated she is still in AFib this morning was told to take Cartia 240 mg 2 tablets this morning.Stated heart not beating fast but still out of rhythm.She feels ok just tired and a little anxious.Advised I will send message to [facility] for advice.

## 2015-06-07 NOTE — Telephone Encounter (Signed)
NewMessage  Pt requested to follow up w/ RN about being in [facility] yesterday- was told to call back today. Please call back and discuss.

## 2015-06-07 NOTE — Telephone Encounter (Signed)
See previous 06/07/15 note.

## 2015-06-08 ENCOUNTER — Encounter: Payer: Self-pay | Admitting: Nurse Practitioner

## 2015-06-08 ENCOUNTER — Ambulatory Visit (INDEPENDENT_AMBULATORY_CARE_PROVIDER_SITE_OTHER): Payer: Medicare Other | Admitting: Nurse Practitioner

## 2015-06-08 VITALS — BP 120/68 | HR 58 | Ht 66.0 in | Wt 126.8 lb

## 2015-06-08 DIAGNOSIS — I10 Essential (primary) hypertension: Secondary | ICD-10-CM

## 2015-06-08 DIAGNOSIS — I480 Paroxysmal atrial fibrillation: Secondary | ICD-10-CM | POA: Diagnosis not present

## 2015-06-08 DIAGNOSIS — I350 Nonrheumatic aortic (valve) stenosis: Secondary | ICD-10-CM | POA: Diagnosis not present

## 2015-06-08 DIAGNOSIS — Z7901 Long term (current) use of anticoagulants: Secondary | ICD-10-CM | POA: Diagnosis not present

## 2015-06-08 MED ORDER — METOPROLOL TARTRATE 50 MG PO TABS: 50.0000 mg | ORAL_TABLET | Freq: Two times a day (BID) | ORAL | Status: DC

## 2015-06-08 NOTE — Patient Instructions (Addendum)
We will be checking the following labs today - NONE  Medication Instructions:    Continue with your current medicines. BUT  Go back to just one Cartia each day  Increase the metoprolol to 50 mg twice a day - you may take 2 of your 25 mg tablets twice a day to use up - the RX for the 50 mg is at your drug store.     Testing/Procedures To Be Arranged:  N/A  Follow-Up:   See Dr. Martinique in July as planned with your echo.     Other Special Instructions:   N/A    If you need a refill on your cardiac medications before your next appointment, please call your pharmacy.   Call the [place] office at [phone] if you have any questions, problems or concerns.

## 2015-06-08 NOTE — Progress Notes (Signed)
CARDIOLOGY OFFICE NOTE  Date:  06/08/2015    [name] [name] Date of Birth: Apr 17, 1929 Medical Record [id number]  PCP:  [name], MD  Cardiologist:  Martinique    Chief Complaint  Patient presents with  . Atrial Fibrillation    Work in visit - seen for Dr. Martinique    History of Present Illness: [name] [name] is a 82 y.o. female who presents today for a work in visit. Seen for Dr. Martinique.   She has a history of paroxysmal atrial fibrillation and moderate aortic stenosis. She has had tachy-brady syndrome and is s/p pacemaker implant. She is on chronic anticoagulation with Xarelto. Echo in July 2015 showed moderate aortic stenosis that had progressed since 2012. Repeat in July 2016 showed no change.   She was last seen back in January and felt to be doing ok.   Was in the ER on Monday - in AF with RVR - extra po dose of Cartia advised. Remains on chronic anticoagulation.   Phone call yesterday - "Spoke to patient.[name] advised continue increased dose of Cartia. Appointment scheduled with Truitt Merle NP tomorrow 06/08/15 at 3:00 pm at [facility] office.Cardioversion to be scheduled later this week if AFib persists since you are already on Xarelto." Thus added to my schedule for today.  Comes in today. Here alone. She was given what sounds like a steroid dose pak for a ruptured Baker's cyst - she had more palpitations - ended up in the ER with the AF - she has been taking an extra Diltiazem each day since. She stopped the steroid. Less palpitations now and feeling better. No chest pain. Not short of breath. No passing out spells. No missed doses of her Xarelto.   Past Medical History  Diagnosis Date  . Hyperlipidemia   . Hypertension   . Sick sinus syndrome (HCC)     s/p PPM (MDT)  . Syncope   . Osteoarthritis   . Paroxysmal atrial fibrillation (HCC)   . Hypothyroidism   . Aortic stenosis     mild  . GERD (gastroesophageal reflux disease)   . Diverticulosis   .  Hemorrhoids   . Aortic stenosis     Past Surgical History  Procedure Laterality Date  . Insert / replace / remove pacemaker  1993    for syptomatic bradycardia and syncope -- in [facility]  . Insert / replace / remove pacemaker  2001    pulse generator replacement by Dr. Rollene Fare   . Insert / replace / remove pacemaker  04/01/2008    PPM Medtronic -- model # [id number] serial # [id number] -- pulse generator replacement by Dr. Verlon Setting   . Abdominal hysterectomy    . Basal cell carcinoma excision    . Squamous cell carcinoma excision       Medications: Current Outpatient Prescriptions  Medication Sig Dispense Refill  . calcium carbonate (OS-CAL) 600 MG TABS Take 600 mg by mouth daily.      Marland Kitchen CARTIA XT 240 MG 24 hr capsule TAKE 1 CAPSULE BY MOUTH EVERY DAY 90 capsule 2  . Cholecalciferol (VITAMIN D) 1000 UNITS capsule Take 1,000 Units by mouth daily.      Marland Kitchen levothyroxine (SYNTHROID, LEVOTHROID) 100 MCG tablet Take 100 mcg by mouth daily.      . Multiple Vitamins-Minerals (ICAPS AREDS 2) CAPS Take 1 capsule by mouth 2 (two) times daily.    Alveda Reasons 15 MG TABS tablet TAKE 1 TABLET BY MOUTH EVERY DAY  90 tablet 3  . metoprolol (LOPRESSOR) 50 MG tablet Take 1 tablet (50 mg total) by mouth 2 (two) times daily. 180 tablet 3   No current facility-administered medications for this visit.    Allergies: Allergies  Allergen Reactions  . Penicillins Anaphylaxis  . Tetanus Toxoid Anaphylaxis  . Demerol Other (See Comments)    Severe nausea  . Codeine Nausea Only    Severe nausea    Social History: The patient  reports that she quit smoking about 37 years ago. She has never used smokeless tobacco. She reports that she does not drink alcohol or use illicit drugs.   Family History: The patient's family history includes Alzheimer's disease in her mother; Cancer in her father.   Review of Systems: Please see the history of present illness.   Otherwise, the review of systems is positive for  none.   All other systems are reviewed and negative.   Physical Exam: VS:  BP 120/68 mmHg  Pulse 58  Ht 5\' 6"  (1.676 m)  Wt 126 lb 12.8 oz (57.516 kg)  BMI 20.48 kg/m2 .  BMI Body mass index is 20.48 kg/(m^2).  Wt Readings from Last 3 Encounters:  06/08/15 126 lb 12.8 oz (57.516 kg)  03/28/15 121 lb (54.885 kg)  02/07/15 120 lb (54.432 kg)    General: Pleasant. Thin. She is alert and in no acute distress.  HEENT: Normal. Neck: Supple, no JVD, carotid bruits, or masses noted.  Cardiac: Regular rate and rhythm. Outflow murmur noted. No edema.  Respiratory:  Lungs are clear to auscultation bilaterally with normal work of breathing.  GI: Soft and nontender.  MS: No deformity or atrophy. Gait and ROM intact. Skin: Warm and dry. Color is normal.  Neuro:  Strength and sensation are intact and no gross focal deficits noted.  Psych: Alert, appropriate and with normal affect.   LABORATORY DATA:  EKG:  EKG is ordered today. This demonstrates A pacing with V sensing - she is NOT in atrial fibrillation on today's tracing.  Lab Results  Component Value Date   WBC 8.3 06/06/2015   HGB 14.5 06/06/2015   HCT 43.4 06/06/2015   PLT 245 06/06/2015   GLUCOSE 122* 06/06/2015   NA 136 06/06/2015   K 4.3 06/06/2015   CL 99* 06/06/2015   CREATININE 1.38* 06/06/2015   BUN 31* 06/06/2015   CO2 26 06/06/2015   TSH 0.90 06/13/2011   INR 2.0 07/20/2011    BNP (last 3 results) No results for input(s): BNP in the last 8760 hours.  ProBNP (last 3 results) No results for input(s): PROBNP in the last 8760 hours.   Other Studies Reviewed Today:  Echo: 09/07/14:  Study Conclusions  - Left ventricle: The cavity size was normal. Systolic function was normal. The estimated ejection fraction was in the range of 60% to 65%. Wall motion was normal; there were no regional wall motion abnormalities. - Aortic valve: Valve mobility was restricted. There was moderate stenosis. There was mild  regurgitation. Peak velocity (S): 323 cm/s. Mean gradient (S): 23 mm Hg. Valve area (VTI): 1.07 cm^2. Valve area (Vmax): 1.02 cm^2. - Mitral valve: Mild prolapse, involving the anterior leaflet. - Right ventricle: The cavity size was mildly dilated. Wall thickness was normal. - Tricuspid valve: There was moderate regurgitation. - Pulmonary arteries: Systolic pressure was mildly increased. PA peak pressure: 34 mm Hg (S).  Impressions:  - When compared to prior echocardiogram, no significant change in aortic stenosis.   Assessment/Plan: 1. Paroxysmal  atrial fibrillation with sick sinus syndrome with underlying pacemaker in place. She is out of atrial fib now. She has been on a total of 480 mg of Cartia - I am cutting this back to just her usual dose of 240 mg. Will increase her Lopressor to 50 mg BID instead. She may still use extra metoprolol prn. See back as planned. No need to proceed with cardioversion at this time. I suspect the steroids is what triggered this most recent event. If continues to have spells, may need to consider AAD therapy.   2. Chronic anticoagulation with Xarelto - she is on renal dose. No problems noted.   3. Aortic stenosis- moderate by Echo. Progressed since 2012 but no change in last year. Asymptomatic. For repeat Echo in July 2017. This has been scheduled today.   4. HTN- controlled on her current regimen.   Current medicines are reviewed with the patient today.  The patient does not have concerns regarding medicines other than what has been noted above.  The following changes have been made:  See above.  Labs/ tests ordered today include:    Orders Placed This Encounter  Procedures  . EKG 12-Lead     Disposition:   FU with Dr. Martinique as planned.   Patient is agreeable to this plan and will call if any problems develop in the interim.   Signed: Burtis Junes, RN, ANP-C 06/08/2015 3:26 PM  [place] [address] [place], [place]  [address] Phone: [phone] Fax: [phone]

## 2015-07-26 LAB — CUP PACEART REMOTE DEVICE CHECK
Battery Impedance: 375 Ohm
Battery Remaining Longevity: 106 mo
Battery Voltage: 2.79 V
Brady Statistic AS VP Percent: 0 %
Date Time Interrogation Session: 20170315144335
Implantable Lead Implant Date: 19930514
Implantable Lead Location: 753860
Implantable Lead Model: 4261
Lead Channel Impedance Value: 638 Ohm
Lead Channel Impedance Value: 681 Ohm
Lead Channel Sensing Intrinsic Amplitude: 8 mV
Lead Channel Setting Pacing Amplitude: 2 V
Lead Channel Setting Pacing Pulse Width: 1 ms
Lead Channel Setting Sensing Sensitivity: 4 mV
MDC IDC LEAD IMPLANT DT: 19930514
MDC IDC LEAD LOCATION: 753859
MDC IDC LEAD MODEL: 4271
MDC IDC LEAD SERIAL: 201535
MDC IDC LEAD SERIAL: 3473
MDC IDC MSMT LEADCHNL RA PACING THRESHOLD AMPLITUDE: 0.875 V
MDC IDC MSMT LEADCHNL RA PACING THRESHOLD PULSEWIDTH: 0.4 ms
MDC IDC SET LEADCHNL RV PACING AMPLITUDE: 3.5 V
MDC IDC STAT BRADY AP VP PERCENT: 0 %
MDC IDC STAT BRADY AP VS PERCENT: 82 %
MDC IDC STAT BRADY AS VS PERCENT: 18 %

## 2015-07-26 NOTE — Progress Notes (Signed)
Remote pacemaker transmission.   

## 2015-07-27 ENCOUNTER — Encounter: Payer: Self-pay | Admitting: Cardiology

## 2015-08-18 ENCOUNTER — Other Ambulatory Visit: Payer: Self-pay

## 2015-08-18 ENCOUNTER — Ambulatory Visit (HOSPITAL_COMMUNITY): Payer: Medicare Other | Attending: Cardiovascular Disease

## 2015-08-18 DIAGNOSIS — I480 Paroxysmal atrial fibrillation: Secondary | ICD-10-CM | POA: Diagnosis not present

## 2015-08-18 DIAGNOSIS — E785 Hyperlipidemia, unspecified: Secondary | ICD-10-CM | POA: Diagnosis not present

## 2015-08-18 DIAGNOSIS — I350 Nonrheumatic aortic (valve) stenosis: Secondary | ICD-10-CM

## 2015-08-18 DIAGNOSIS — I340 Nonrheumatic mitral (valve) insufficiency: Secondary | ICD-10-CM | POA: Insufficient documentation

## 2015-08-18 DIAGNOSIS — Z87891 Personal history of nicotine dependence: Secondary | ICD-10-CM | POA: Insufficient documentation

## 2015-08-18 DIAGNOSIS — I495 Sick sinus syndrome: Secondary | ICD-10-CM | POA: Diagnosis not present

## 2015-08-18 DIAGNOSIS — I071 Rheumatic tricuspid insufficiency: Secondary | ICD-10-CM | POA: Diagnosis not present

## 2015-08-18 DIAGNOSIS — Z950 Presence of cardiac pacemaker: Secondary | ICD-10-CM

## 2015-08-18 DIAGNOSIS — I352 Nonrheumatic aortic (valve) stenosis with insufficiency: Secondary | ICD-10-CM | POA: Diagnosis not present

## 2015-08-18 DIAGNOSIS — I10 Essential (primary) hypertension: Secondary | ICD-10-CM | POA: Diagnosis not present

## 2015-08-18 LAB — ECHOCARDIOGRAM COMPLETE
AO mean calculated velocity dopler: 244 cm/s
AOVTI: 96.6 cm
AV Area mean vel: 0.92 cm2
AV area mean vel ind: 0.56 cm2/m2
AV pk vel: 347 cm/s
AVAREAVTI: 1.04 cm2
AVAREAVTIIND: 0.62 cm2/m2
AVCELMEANRAT: 0.29
AVG: 27 mmHg
AVLVOTPG: 5 mmHg
AVPG: 48 mmHg
AVPHT: 605 ms
Ao pk vel: 0.33 m/s
Ao-asc: 31 cm
CHL CUP AV PEAK INDEX: 0.64
CHL CUP AV VALUE AREA INDEX: 0.62
CHL CUP AV VEL: 1.02
CHL CUP DOP CALC LVOT VTI: 31.4 cm
CHL CUP MV DEC (S): 236
CHL CUP TV REG PEAK VELOCITY: 257 cm/s
EERAT: 11.97
EWDT: 236 ms
FS: 35 % (ref 28–44)
IVS/LV PW RATIO, ED: 1.06
LA ID, A-P, ES: 36 mm
LA diam index: 2.2 cm/m2
LA vol A4C: 41.6 ml
LA vol index: 27.9 mL/m2
LA vol: 45.5 mL
LEFT ATRIUM END SYS DIAM: 36 mm
LV PW d: 8.67 mm — AB (ref 0.6–1.1)
LV TDI E'MEDIAL: 4.88
LV e' LATERAL: 7.42 cm/s
LVEEAVG: 11.97
LVEEMED: 11.97
LVOT area: 3.14 cm2
LVOT peak VTI: 0.33 cm
LVOT peak vel: 115 cm/s
LVOTD: 20 mm
LVOTSV: 99 mL
Lateral S' vel: 10.4 cm/s
MV pk A vel: 78.1 m/s
MV pk E vel: 88.8 m/s
MVPG: 3 mmHg
PV Reg vel dias: 107 cm/s
RV TAPSE: 23.6 mm
RV sys press: 34 mmHg
TDI e' lateral: 7.42
TR max vel: 257 cm/s
Valve area: 1.02 cm2

## 2015-08-26 ENCOUNTER — Telehealth: Payer: Self-pay | Admitting: Cardiology

## 2015-08-26 NOTE — Telephone Encounter (Signed)
New message   Pt verbalized that she is calling to see if pt is to take a whole 50 mg or a extra 25mg  of Metoprolol    Patient c/o Palpitations:  High priority if patient c/o lightheadedness and shortness of breath.  1. How long have you been having palpitations? 6-23 this morning  2. Are you currently experiencing lightheadedness and shortness of breath? no 3. Have you checked your BP and heart rate? (document readings)  She will tell rn when she calls her back  4. Are you experiencing any other symptoms? headheache

## 2015-08-26 NOTE — Telephone Encounter (Addendum)
Returned call to patient.She stated the last time she saw [name] Merle NP she increased Metoprolol to 50 mg twice a day.Stated she wanted to know can she take a extra 50 mg if needed.Stated she has been having irregular heart beat this morning after morning dose of metoprolol.Advised she can take a extra 25 mg or 50 mg if needed.Stated at present she is ok.Advised to keep appointment with [name] [name] 09/02/15 at 3:30 pm.

## 2015-08-29 ENCOUNTER — Ambulatory Visit (INDEPENDENT_AMBULATORY_CARE_PROVIDER_SITE_OTHER): Payer: Medicare Other | Admitting: Cardiology

## 2015-08-29 ENCOUNTER — Encounter: Payer: Self-pay | Admitting: Cardiology

## 2015-08-29 ENCOUNTER — Other Ambulatory Visit: Payer: Self-pay | Admitting: Internal Medicine

## 2015-08-29 ENCOUNTER — Telehealth: Payer: Self-pay | Admitting: Cardiology

## 2015-08-29 VITALS — BP 110/64 | Ht 66.0 in | Wt 123.2 lb

## 2015-08-29 DIAGNOSIS — I10 Essential (primary) hypertension: Secondary | ICD-10-CM | POA: Diagnosis not present

## 2015-08-29 DIAGNOSIS — I350 Nonrheumatic aortic (valve) stenosis: Secondary | ICD-10-CM | POA: Diagnosis not present

## 2015-08-29 DIAGNOSIS — I495 Sick sinus syndrome: Secondary | ICD-10-CM

## 2015-08-29 DIAGNOSIS — N63 Unspecified lump in unspecified breast: Secondary | ICD-10-CM

## 2015-08-29 DIAGNOSIS — Z7901 Long term (current) use of anticoagulants: Secondary | ICD-10-CM

## 2015-08-29 DIAGNOSIS — N644 Mastodynia: Secondary | ICD-10-CM

## 2015-08-29 DIAGNOSIS — I480 Paroxysmal atrial fibrillation: Secondary | ICD-10-CM | POA: Diagnosis not present

## 2015-08-29 MED ORDER — FLECAINIDE ACETATE 50 MG PO TABS: 50.0000 mg | ORAL_TABLET | Freq: Two times a day (BID) | ORAL | Status: DC

## 2015-08-29 NOTE — Telephone Encounter (Signed)
Spoke with pt, she will see dr Martinique today at 1:30 pm

## 2015-08-29 NOTE — Telephone Encounter (Signed)
Spoke with pt, she cont to have what she feels is bouts of atrial fib. Her bp is fine by her reports and her pulse rate is 93-74 bpm. When she feels her pulse there is skipping and then a fluttering in her chest. It is happening all the time and she reports she feels bad. Will discuss with dr Martinique

## 2015-08-29 NOTE — Telephone Encounter (Signed)
Follow up   Pt verbalized that she spoke to [name] [name] and she was told that if rn didn't call her back by 9"30am to call back

## 2015-08-29 NOTE — Patient Instructions (Signed)
Add Flecainide 50 mg twice a day  Continue your other therapy  We will check an Ecg later this week.

## 2015-08-29 NOTE — Telephone Encounter (Signed)
Please call,she is still having the problems she had on Friday.

## 2015-08-30 ENCOUNTER — Ambulatory Visit (INDEPENDENT_AMBULATORY_CARE_PROVIDER_SITE_OTHER): Payer: Medicare Other | Admitting: *Deleted

## 2015-08-30 DIAGNOSIS — I495 Sick sinus syndrome: Secondary | ICD-10-CM

## 2015-08-30 NOTE — Progress Notes (Signed)
Remote pacemaker transmission.   

## 2015-08-30 NOTE — Progress Notes (Signed)
[name] [name] Date of Birth: [birth year] Medical Record [id number]  History of Present Illness: [name] [name] is seen today as a work in today.  She has a history of paroxysmal atrial fibrillation with tachy-brady syndrome and is s/p pacemaker implant. She is on chronic anticoagulation with Xarelto. Echo in July 2015 showed moderate aortic stenosis that had progressed since 2012. Repeat in July 2016 and July 2017 showed no change. In April she had atrial fibrillation that converted spontaneously. This was felt to be triggered by a steroid injection. Her metoprolol was increased at that time.   On follow up today she states she has not felt well for 3 days. She notes an irregular pulse and fluttering in her chest. She feels very tired and fatigued. No dyspnea, chest pain, or syncope. Last pacer check was in March.   Current Outpatient Prescriptions on File Prior to Visit  Medication Sig Dispense Refill  . calcium carbonate (OS-CAL) 600 MG TABS Take 600 mg by mouth daily.      Marland Kitchen CARTIA XT 240 MG 24 hr capsule TAKE 1 CAPSULE BY MOUTH EVERY DAY 90 capsule 2  . Cholecalciferol (VITAMIN D) 1000 UNITS capsule Take 1,000 Units by mouth daily.      Marland Kitchen levothyroxine (SYNTHROID, LEVOTHROID) 100 MCG tablet Take 100 mcg by mouth daily.      . metoprolol (LOPRESSOR) 50 MG tablet Take 1 tablet (50 mg total) by mouth 2 (two) times daily. 180 tablet 3  . Multiple Vitamins-Minerals (ICAPS AREDS 2) CAPS Take 1 capsule by mouth 2 (two) times daily.    Alveda Reasons 15 MG TABS tablet TAKE 1 TABLET BY MOUTH EVERY DAY 90 tablet 3   No current facility-administered medications on file prior to visit.    Allergies  Allergen Reactions  . Penicillins Anaphylaxis  . Tetanus Toxoid Anaphylaxis  . Demerol Other (See Comments)    Severe nausea  . Codeine Nausea Only    Severe nausea    Past Medical History  Diagnosis Date  . Hyperlipidemia   . Hypertension   . Sick sinus syndrome (HCC)     s/p PPM (MDT)  .  Syncope   . Osteoarthritis   . Paroxysmal atrial fibrillation (HCC)   . Hypothyroidism   . Aortic stenosis     mild  . GERD (gastroesophageal reflux disease)   . Diverticulosis   . Hemorrhoids   . Aortic stenosis     Past Surgical History  Procedure Laterality Date  . Insert / replace / remove pacemaker  1993    for syptomatic bradycardia and syncope -- in [facility]  . Insert / replace / remove pacemaker  2001    pulse generator replacement by Dr. Rollene Fare   . Insert / replace / remove pacemaker  04/01/2008    PPM Medtronic -- model # [id number] serial # [id number] -- pulse generator replacement by Dr. Verlon Setting   . Abdominal hysterectomy    . Basal cell carcinoma excision    . Squamous cell carcinoma excision      History  Smoking status  . Former Smoker  . Quit date: 03/05/1978  Smokeless tobacco  . Never Used    History  Alcohol Use No    Family History  Problem Relation Age of Onset  . Cancer Father     lung cancer  . Alzheimer's disease Mother     Review of Systems: As noted in history of present illness.  All other systems were reviewed and are negative.  Physical Exam: BP 110/64 mmHg  Ht 5\' 6"  (1.676 m)  Wt 123 lb 4 oz (55.906 kg)  BMI 19.90 kg/m2 Patient is very pleasant and in no acute distress.  Skin is warm and dry. Color is normal.  HEENT is unremarkable. Normocephalic/atraumatic. PERRL. Sclera are nonicteric. Neck is supple. No masses. No JVD. Lungs are clear. Cardiac exam shows an irregular rate and rhythm. Harsh Grade 2/6 systolic murmur RUSB>>apex. Abdomen is soft. Extremities are without edema. Gait and ROM are intact. No gross neurologic deficits noted.  LABORATORY DATA:  Echo: 08/18/15 Study Conclusions  - Left ventricle: The cavity size was normal. Wall thickness was  normal. Systolic function was normal. The estimated ejection  fraction was in the range of 55% to 60%. Wall motion was normal;  there were no regional wall motion  abnormalities. Features are  consistent with a pseudonormal left ventricular filling pattern,  with concomitant abnormal relaxation and increased filling  pressure (grade 2 diastolic dysfunction). - Aortic valve: There was moderate stenosis. There was mild to  moderate regurgitation directed centrally in the LVOT. - Mitral valve: There was mild regurgitation directed centrally. - Tricuspid valve: There was mild-moderate regurgitation directed  centrally. - Pulmonary arteries: Systolic pressure was mildly increased. PA  peak pressure: 34 mm Hg (S).  Labs from primary care dated 07/14/15: Normal CMET and CBC. Cholesterol 229, trig 111, HDL 76, LDL 131. TSH 1.98.    Assessment / Plan: 1. Recurrent and now persistent atrial fibrillation with sick sinus syndrome. Status post pacemaker implant. Rate is well controlled on metoprolol and diltiazem. She is symptomatic. Discussed antiarrhythmic drug therapy. Will start Flecainide 50 mg bid. No history of CAD and I am not inclined to perform stress testing in absence of symptoms.  Continue long-term anticoagulation with Xarelto. Dose adjusted for age and renal function. Will repeat Ecg later this week and I will follow up in 3 months. If Flecainide controls rhythm well would consider tapering off Cardizem later.   2. Aortic stenosis- moderate by Echo. Progressed since 2012 but no change in last 2 years. Asymptomatic. Follow up in one year.  3. HTN- controlled

## 2015-08-31 ENCOUNTER — Encounter: Payer: Self-pay | Admitting: Cardiology

## 2015-08-31 LAB — CUP PACEART REMOTE DEVICE CHECK
Battery Impedance: 425 Ohm
Brady Statistic AP VS Percent: 81 %
Brady Statistic AS VP Percent: 0 %
Brady Statistic AS VS Percent: 18 %
Implantable Lead Implant Date: 19930514
Implantable Lead Location: 753859
Implantable Lead Location: 753860
Implantable Lead Model: 4261
Implantable Lead Model: 4271
Implantable Lead Serial Number: 201535
Implantable Lead Serial Number: 3473
Lead Channel Impedance Value: 616 Ohm
Lead Channel Pacing Threshold Amplitude: 0.875 V
Lead Channel Pacing Threshold Pulse Width: 0.4 ms
Lead Channel Sensing Intrinsic Amplitude: 8 mV
MDC IDC LEAD IMPLANT DT: 19930514
MDC IDC MSMT BATTERY REMAINING LONGEVITY: 101 mo
MDC IDC MSMT BATTERY VOLTAGE: 2.78 V
MDC IDC MSMT LEADCHNL RV IMPEDANCE VALUE: 706 Ohm
MDC IDC SESS DTM: 20170627122545
MDC IDC SET LEADCHNL RA PACING AMPLITUDE: 2 V
MDC IDC SET LEADCHNL RV PACING AMPLITUDE: 3.5 V
MDC IDC SET LEADCHNL RV PACING PULSEWIDTH: 1 ms
MDC IDC SET LEADCHNL RV SENSING SENSITIVITY: 2.8 mV
MDC IDC STAT BRADY AP VP PERCENT: 0 %

## 2015-09-01 ENCOUNTER — Ambulatory Visit (INDEPENDENT_AMBULATORY_CARE_PROVIDER_SITE_OTHER): Payer: Medicare Other | Admitting: *Deleted

## 2015-09-01 ENCOUNTER — Encounter: Payer: Self-pay | Admitting: *Deleted

## 2015-09-01 VITALS — BP 134/80 | HR 61 | Wt 127.2 lb

## 2015-09-01 DIAGNOSIS — I480 Paroxysmal atrial fibrillation: Secondary | ICD-10-CM | POA: Diagnosis not present

## 2015-09-01 NOTE — Progress Notes (Signed)
1.) Reason for visit: EKG VISIT  2.) Name of MD requesting visit: DR Martinique  3.) H&P: NO SYMPTOMS VOICED BY PATIENT. SEE VITAL SIGNS. PATIENT STATES SHE WENT BAK INTO RHYTHM THE NEXT DAY AFTER SEEING DR Martinique AT RECENT OFFICE VISIT. SHE HAD ONLY TAKEN ONE DOSE OF FLECAINIDE.  4.) ROS related to problem: EKG OBTAINED AND REVIEWED BY DR Martinique. PATIENT HAD COUPLE OF QUESTION.  1- DID SHE NEED TO CONTINUE TAKING EXTRA  DOSE OF METOPROLOL TARTRATE 25 MG    DAILY ALONG WITH THE 50 MG  METOPROLOL TARTRATE TWICE A DAY?  2--WILL SHE NEED TO CONTINUE WITH THE  FLECAINIDE AFTER THIS MONTH DOSAGE?   5.) Assessment and plan per MD: DR Martinique REVIEWED AND INTERPRET EKG. NO CHANGES PER DR Martinique.  CONTINUE WITH EXTRA METOPROLOL TARTRATE 25 MG. FLECAINIDE IS LONG TERM ,CONTINUE WITH MEDICATION . PATIENT MADE AWARE AND VERBALIZED UNDERSTANDING. SHE STATES SHE WILL CALL WHEN SHE NEEDS REFILL WITH BOTH MEDICATIONS

## 2015-09-01 NOTE — Patient Instructions (Signed)
CONTINUE WITH METOPROLOL  50 MG TWICE  A DAY, AND ADDITIONAL 25 MG METOPROLOL AT LUNCH TIME. AS WELL.  FLECAINIDE IS A LONG-TERM MEDICATION NOW - CONTINUE - CALL WHEN READY FOR REFILL.  NO OTHER CHANGES .  DR Martinique REVIEWED EKG - YOU ARE NOT IN ATRIAL FIBRILLATION AT PRESENT TIME.

## 2015-09-02 ENCOUNTER — Ambulatory Visit: Payer: Medicare Other | Admitting: Cardiology

## 2015-09-02 ENCOUNTER — Other Ambulatory Visit (HOSPITAL_COMMUNITY): Payer: Medicare Other

## 2015-10-05 ENCOUNTER — Telehealth: Payer: Self-pay | Admitting: Cardiology

## 2015-10-05 NOTE — Telephone Encounter (Signed)
Flecainide may be the cause of her lack of energy and not feeling right. It may also cause some swelling. I would stop Flecainide. We will monitor how much afib she is having by pacer interrogation. If frequent we may need to try an alternative antiarrhythmic drug.  Seydou Hearns Martinique MD, [facility]

## 2015-10-05 NOTE — Telephone Encounter (Signed)
New message  Pt c/o medication issue:  1. Name of Medication: Flecainide   2. How are you currently taking this medication (dosage and times per day)? Pt states she does not have the bottle in front of her   3. Are you having a reaction (difficulty breathing--STAT)?  Pt did not want to disclose reaction  4. What is your medication issue? Pt states she think med is making her have a reaction. Pt did not want to disclose the reaction it was giving. Please call back to discuss

## 2015-10-05 NOTE — Telephone Encounter (Signed)
Spoke with pt, she has been taking the flecainide for one month now. She is having some swelling in her feet and ankles, reports no energy and just does not feel right. She had an episode last night of a fluttering or "jello" type feeling in her chest. It lasted some time and her bp was 184/81. She does not feel she was in atrial fib, it was a different feeling than what she feels when in a fib. This morning her bp is 141/74 which is her normal. She continues to take the metoprolol and cartia. Will forward for dr jordan's review and advise.

## 2015-10-05 NOTE — Telephone Encounter (Signed)
Spoke with pt, aware of dr jordan's recommendations. 

## 2015-11-02 ENCOUNTER — Other Ambulatory Visit (HOSPITAL_COMMUNITY): Payer: Self-pay

## 2015-11-03 ENCOUNTER — Ambulatory Visit (HOSPITAL_COMMUNITY)
Admission: RE | Admit: 2015-11-03 | Discharge: 2015-11-03 | Disposition: A | Discharge status: Home / Self Care | Payer: Medicare Other | Source: Ambulatory Visit | Attending: Internal Medicine | Admitting: Internal Medicine

## 2015-11-03 DIAGNOSIS — M810 Age-related osteoporosis without current pathological fracture: Secondary | ICD-10-CM | POA: Diagnosis not present

## 2015-11-03 MED ORDER — DENOSUMAB 60 MG/ML ~~LOC~~ SOLN
60.0000 mg | Freq: Once | SUBCUTANEOUS | Status: AC
  Administered 2015-11-03: 60 mg via SUBCUTANEOUS
  Filled 2015-11-03: qty 1

## 2015-11-08 ENCOUNTER — Other Ambulatory Visit: Payer: Self-pay | Admitting: Cardiology

## 2015-11-08 NOTE — Telephone Encounter (Signed)
Rx(s) sent to pharmacy electronically.  

## 2015-11-29 ENCOUNTER — Ambulatory Visit (INDEPENDENT_AMBULATORY_CARE_PROVIDER_SITE_OTHER): Payer: Medicare Other | Admitting: *Deleted

## 2015-11-29 ENCOUNTER — Telehealth: Payer: Self-pay | Admitting: Cardiology

## 2015-11-29 DIAGNOSIS — I495 Sick sinus syndrome: Secondary | ICD-10-CM | POA: Diagnosis not present

## 2015-11-29 NOTE — Telephone Encounter (Signed)
Spoke with pt and reminded pt of remote transmission that is due today. Pt verbalized understanding.   

## 2015-11-29 NOTE — Progress Notes (Signed)
Remote pacemaker transmission.   

## 2015-11-30 ENCOUNTER — Encounter: Payer: Self-pay | Admitting: Cardiology

## 2015-12-14 NOTE — Progress Notes (Signed)
[name] [name] Date of Birth: 1929-05-24 Medical Record [id number]  History of Present Illness: [name] [name] is seen today for follow up Afib and AS.  She has a history of paroxysmal atrial fibrillation with tachy-brady syndrome and is s/p pacemaker implant. She is on chronic anticoagulation with Xarelto. Echo in July 2015 showed moderate aortic stenosis that had progressed since 2012. Repeat in July 2016 and July 2017 showed no change. In April she had atrial fibrillation that converted spontaneously. This was felt to be triggered by a steroid injection. Her metoprolol was increased at that time. When seen in June she was noted to be in persistent Afib. We placed her on Flecainide but this was later stopped due to side effects. Last pacemaker check on September 26 has not been reported yet.  On follow up today she states she is doing very well. She denies any palpitations or SOB. Her energy level is good. No chest pain or syncope.   Current Outpatient Prescriptions on File Prior to Visit  Medication Sig Dispense Refill  . calcium carbonate (OS-CAL) 600 MG TABS Take 600 mg by mouth daily.      Marland Kitchen CARTIA XT 240 MG 24 hr capsule TAKE 1 CAPSULE BY MOUTH EVERY DAY 90 capsule 2  . Cholecalciferol (VITAMIN D) 1000 UNITS capsule Take 1,000 Units by mouth daily.      Marland Kitchen levothyroxine (SYNTHROID, LEVOTHROID) 100 MCG tablet Take 100 mcg by mouth daily.      . metoprolol (LOPRESSOR) 50 MG tablet Take 1 tablet (50 mg total) by mouth 2 (two) times daily. 180 tablet 3  . Multiple Vitamins-Minerals (ICAPS AREDS 2) CAPS Take 1 capsule by mouth 2 (two) times daily.    [name] [name] 15 MG TABS tablet TAKE 1 TABLET BY MOUTH EVERY DAY 90 tablet 3   No current facility-administered medications on file prior to visit.     Allergies  Allergen Reactions  . Penicillins Anaphylaxis  . Tetanus Toxoid Anaphylaxis  . Demerol Other (See Comments)    Severe nausea  . Codeine Nausea Only    Severe nausea    Past Medical  History:  Diagnosis Date  . Aortic stenosis    mild  . Aortic stenosis   . Diverticulosis   . GERD (gastroesophageal reflux disease)   . Hemorrhoids   . Hyperlipidemia   . Hypertension   . Hypothyroidism   . Osteoarthritis   . Paroxysmal atrial fibrillation (HCC)   . Sick sinus syndrome (HCC)    s/p PPM (MDT)  . Syncope     Past Surgical History:  Procedure Laterality Date  . ABDOMINAL HYSTERECTOMY    . BASAL CELL CARCINOMA EXCISION    . INSERT / REPLACE / [place]   for syptomatic bradycardia and syncope -- in [facility]  . INSERT / REPLACE / REMOVE PACEMAKER  2001   pulse generator replacement by Dr. Rollene [name]   . INSERT / REPLACE / REMOVE PACEMAKER  04/01/2008   PPM Medtronic -- model # [id number] serial # [id number] -- pulse generator replacement by Dr. Verlon [name]   . SQUAMOUS CELL CARCINOMA EXCISION      History  Smoking Status  . Former Smoker  . Quit date: 03/05/1978  Smokeless Tobacco  . Never Used    History  Alcohol Use No    Family History  Problem Relation Age of Onset  . Cancer Father     lung cancer  . Alzheimer's disease Mother     Review  of Systems: As noted in history of present illness.  All other systems were reviewed and are negative.  Physical Exam: BP (!) 142/76   Pulse 86   Ht 5\' 6"  (1.676 m)   Wt 123 lb 6.4 oz (56 kg)   BMI 19.92 kg/m  Patient is very pleasant and in no acute distress.  Skin is warm and dry. Color is normal.  HEENT is unremarkable. Normocephalic/atraumatic. PERRL. Sclera are nonicteric. Neck is supple. No masses. No JVD. Lungs are clear. Cardiac exam shows an irregular rate and rhythm. Harsh Grade 2/6 systolic murmur RUSB>>apex. Abdomen is soft. Extremities are without edema. Gait and ROM are intact. No gross neurologic deficits noted.  LABORATORY DATA:  Echo: 08/18/15 Study Conclusions  - Left ventricle: The cavity size was normal. Wall thickness was  normal. Systolic function was normal. The  estimated ejection  fraction was in the range of 55% to 60%. Wall motion was normal;  there were no regional wall motion abnormalities. Features are  consistent with a pseudonormal left ventricular filling pattern,  with concomitant abnormal relaxation and increased filling  pressure (grade 2 diastolic dysfunction). - Aortic valve: There was moderate stenosis. There was mild to  moderate regurgitation directed centrally in the LVOT. - Mitral valve: There was mild regurgitation directed centrally. - Tricuspid valve: There was mild-moderate regurgitation directed  centrally. - Pulmonary arteries: Systolic pressure was mildly increased. PA  peak pressure: 34 mm Hg (S).  Labs from primary care dated 07/14/15: Normal CMET and CBC. Cholesterol 229, trig 111, HDL 76, LDL 131. TSH 1.98.    Assessment / Plan: 1. Atrial fibrillation with sick sinus syndrome. On prior visit she appeared to have persistent AFib. Intolerant of Flecainide. now asymptomatic. Status post pacemaker implant. Rate is well controlled on metoprolol and diltiazem.  Continue long-term anticoagulation with Xarelto. Dose adjusted for age and renal function. Will await follow up Pacemaker check to see what her rhythm burden but for now will continue current therapy.  2. Aortic stenosis- moderate by Echo. Progressed since 2012 but no change in last 2 years. Asymptomatic. Follow up in one year.  3. HTN- controlled  I will follow up in 6 months.

## 2015-12-15 ENCOUNTER — Encounter: Payer: Self-pay | Admitting: Cardiology

## 2015-12-15 ENCOUNTER — Encounter (INDEPENDENT_AMBULATORY_CARE_PROVIDER_SITE_OTHER): Payer: Self-pay

## 2015-12-15 ENCOUNTER — Ambulatory Visit (INDEPENDENT_AMBULATORY_CARE_PROVIDER_SITE_OTHER): Payer: Medicare Other | Admitting: Cardiology

## 2015-12-15 VITALS — BP 142/76 | HR 86 | Ht 66.0 in | Wt 123.4 lb

## 2015-12-15 DIAGNOSIS — I10 Essential (primary) hypertension: Secondary | ICD-10-CM | POA: Diagnosis not present

## 2015-12-15 DIAGNOSIS — I480 Paroxysmal atrial fibrillation: Secondary | ICD-10-CM

## 2015-12-15 DIAGNOSIS — Z950 Presence of cardiac pacemaker: Secondary | ICD-10-CM

## 2015-12-15 DIAGNOSIS — I350 Nonrheumatic aortic (valve) stenosis: Secondary | ICD-10-CM | POA: Diagnosis not present

## 2015-12-15 NOTE — Patient Instructions (Signed)
Continue your current therapy  I will see you in 6 months.   

## 2015-12-27 LAB — CUP PACEART REMOTE DEVICE CHECK
Battery Impedance: 449 Ohm
Battery Remaining Longevity: 96 mo
Battery Voltage: 2.79 V
Brady Statistic AS VS Percent: 16 %
Date Time Interrogation Session: 20170926185228
Implantable Lead Location: 753859
Implantable Lead Model: 4261
Implantable Lead Serial Number: 201535
Lead Channel Setting Pacing Amplitude: 2 V
Lead Channel Setting Pacing Pulse Width: 1 ms
Lead Channel Setting Sensing Sensitivity: 4 mV
MDC IDC LEAD IMPLANT DT: 19930514
MDC IDC LEAD IMPLANT DT: 19930514
MDC IDC LEAD LOCATION: 753860
MDC IDC LEAD MODEL: 4271
MDC IDC LEAD SERIAL: 3473
MDC IDC MSMT LEADCHNL RA IMPEDANCE VALUE: 616 Ohm
MDC IDC MSMT LEADCHNL RA PACING THRESHOLD AMPLITUDE: 0.75 V
MDC IDC MSMT LEADCHNL RA PACING THRESHOLD PULSEWIDTH: 0.4 ms
MDC IDC MSMT LEADCHNL RV IMPEDANCE VALUE: 662 Ohm
MDC IDC MSMT LEADCHNL RV SENSING INTR AMPL: 8 mV
MDC IDC SET LEADCHNL RV PACING AMPLITUDE: 3.5 V
MDC IDC STAT BRADY AP VP PERCENT: 2 %
MDC IDC STAT BRADY AP VS PERCENT: 82 %
MDC IDC STAT BRADY AS VP PERCENT: 0 %

## 2016-01-10 ENCOUNTER — Telehealth: Payer: Self-pay | Admitting: Cardiology

## 2016-01-10 ENCOUNTER — Telehealth: Payer: Self-pay

## 2016-01-10 NOTE — Telephone Encounter (Signed)
Received call from patient.She stated she just went into AFib and she was calling to verify how much extra metoprolol she could take.Advised she can take a extra 1/2 tablet of metoprolol.Advised to go to ER if AFib continues.

## 2016-01-10 NOTE — Telephone Encounter (Signed)
Spoke to patient she stated her heart is back in rhythm after taking a extra 1/2 tablet of metoprolol.Stated she is feeling good at present.Advised to keep appointment with [name] 05/25/16 at 10:00 am, call sooner if needed.

## 2016-01-29 ENCOUNTER — Encounter (HOSPITAL_COMMUNITY): Payer: Self-pay | Admitting: Emergency Medicine

## 2016-01-29 ENCOUNTER — Emergency Department (HOSPITAL_COMMUNITY)
Admission: EM | Admit: 2016-01-29 | Discharge: 2016-01-29 | Disposition: A | Discharge status: Home / Self Care | Payer: Medicare Other | Attending: Emergency Medicine | Admitting: Emergency Medicine

## 2016-01-29 ENCOUNTER — Emergency Department (HOSPITAL_COMMUNITY): Payer: Medicare Other

## 2016-01-29 DIAGNOSIS — I10 Essential (primary) hypertension: Secondary | ICD-10-CM | POA: Insufficient documentation

## 2016-01-29 DIAGNOSIS — Z7901 Long term (current) use of anticoagulants: Secondary | ICD-10-CM | POA: Diagnosis not present

## 2016-01-29 DIAGNOSIS — Z950 Presence of cardiac pacemaker: Secondary | ICD-10-CM | POA: Diagnosis not present

## 2016-01-29 DIAGNOSIS — I4891 Unspecified atrial fibrillation: Secondary | ICD-10-CM | POA: Diagnosis present

## 2016-01-29 DIAGNOSIS — I480 Paroxysmal atrial fibrillation: Secondary | ICD-10-CM

## 2016-01-29 DIAGNOSIS — Z79899 Other long term (current) drug therapy: Secondary | ICD-10-CM | POA: Diagnosis not present

## 2016-01-29 DIAGNOSIS — Z87891 Personal history of nicotine dependence: Secondary | ICD-10-CM | POA: Insufficient documentation

## 2016-01-29 DIAGNOSIS — E039 Hypothyroidism, unspecified: Secondary | ICD-10-CM | POA: Insufficient documentation

## 2016-01-29 LAB — BASIC METABOLIC PANEL
Anion gap: 11 (ref 5–15)
BUN: 25 mg/dL — AB (ref 6–20)
CHLORIDE: 103 mmol/L (ref 101–111)
CO2: 26 mmol/L (ref 22–32)
CREATININE: 1.24 mg/dL — AB (ref 0.44–1.00)
Calcium: 10.1 mg/dL (ref 8.9–10.3)
GFR calc Af Amer: 45 mL/min — ABNORMAL LOW (ref >60)
GFR, EST NON AFRICAN AMERICAN: 39 mL/min — AB (ref >60)
GLUCOSE: 107 mg/dL — AB (ref 65–99)
POTASSIUM: 4 mmol/L (ref 3.5–5.1)
SODIUM: 140 mmol/L (ref 135–145)

## 2016-01-29 LAB — CBC
HEMATOCRIT: 42 % (ref 36.0–46.0)
Hemoglobin: 13.9 g/dL (ref 12.0–15.0)
MCH: 33.9 pg (ref 26.0–34.0)
MCHC: 33.1 g/dL (ref 30.0–36.0)
MCV: 102.4 fL — AB (ref 78.0–100.0)
PLATELETS: 216 10*3/uL (ref 150–400)
RBC: 4.1 MIL/uL (ref 3.87–5.11)
RDW: 13.4 % (ref 11.5–15.5)
WBC: 8.4 10*3/uL (ref 4.0–10.5)

## 2016-01-29 LAB — TROPONIN I: Troponin I: 0.03 ng/mL (ref <0.03)

## 2016-01-29 MED ORDER — METOPROLOL TARTRATE 5 MG/5ML IV SOLN
2.5000 mg | Freq: Once | INTRAVENOUS | Status: AC
  Administered 2016-01-29: 2.5 mg via INTRAVENOUS
  Filled 2016-01-29: qty 5

## 2016-01-29 MED ORDER — WHITE PETROLATUM GEL
Status: DC | PRN
  Filled 2016-01-29: qty 28.35

## 2016-01-29 NOTE — ED Notes (Signed)
Taken to xray at this time. 

## 2016-01-29 NOTE — Discharge Instructions (Signed)
If your heart rate is too high, you may take an extra falf tablet of metoprolol - as often as twice a day.  Return if you are having any problems.

## 2016-01-29 NOTE — ED Provider Notes (Signed)
[facility] DEPT Provider Note   CSN: [id number] Arrival date & time: 01/29/16  0516     History   Chief Complaint Chief Complaint  Patient presents with  . Atrial Fibrillation    HPI [name] [name] is a 82 y.o. female.  She has a history of paroxysmal atrial fibrillation. Yesterday afternoon, she noted that her heart was beating irregularly. She took Her half pill of metoprolol and her heart felt like it was doing better. She went to sleep last night and woke up in a sweat with some nausea and a feeling that her heart was racing once again. This is the way she typically feels when she is in atrial fibrillation. She has not vomited. She denies chest pain, heaviness, tightness, pressure. She has not taken any additional metoprolol because she has to take it with food. Nothing makes her symptoms better nothing makes them worse.   The history is provided by the patient.  Atrial Fibrillation     Past Medical History:  Diagnosis Date  . Aortic stenosis    mild  . Aortic stenosis   . Diverticulosis   . GERD (gastroesophageal reflux disease)   . Hemorrhoids   . Hyperlipidemia   . Hypertension   . Hypothyroidism   . Osteoarthritis   . Paroxysmal atrial fibrillation (HCC)   . Sick sinus syndrome (HCC)    s/p PPM (MDT)  . Syncope     Patient Active Problem List   Diagnosis Date Noted  . Aortic stenosis 02/05/2014  . Pacemaker-Medtronic 01/01/2012  . Long term current use of anticoagulant therapy 06/18/2011  . Moderate calcific aortic stenosis 09/01/2010  . BRADYCARDIA-TACHYCARDIA SYNDROME 03/31/2010  . HYPERLIPIDEMIA 03/30/2010  . Essential hypertension 03/30/2010  . ATRIAL FIBRILLATION 03/30/2010  . OSTEOARTHRITIS 03/30/2010    Past Surgical History:  Procedure Laterality Date  . ABDOMINAL HYSTERECTOMY    . BASAL CELL CARCINOMA EXCISION    . INSERT / REPLACE / [place]   for syptomatic bradycardia and syncope -- in [facility]  . INSERT /  REPLACE / REMOVE PACEMAKER  2001   pulse generator replacement by Dr. Rollene Fare   . INSERT / REPLACE / REMOVE PACEMAKER  04/01/2008   PPM Medtronic -- model # ADDRL1 serial # [id number] -- pulse generator replacement by Dr. Verlon Setting   . SQUAMOUS CELL CARCINOMA EXCISION      OB History    No data available       Home Medications    Prior to Admission medications   Medication Sig Start Date End Date Taking? Authorizing Provider  calcium carbonate (OS-CAL) 600 MG TABS Take 600 mg by mouth daily.      Historical Provider, MD  CARTIA XT 240 MG 24 hr capsule TAKE 1 CAPSULE BY MOUTH EVERY DAY 11/08/15   Peter M Martinique, MD  Cholecalciferol (VITAMIN D) 1000 UNITS capsule Take 1,000 Units by mouth daily.      Historical Provider, MD  levothyroxine (SYNTHROID, LEVOTHROID) 100 MCG tablet Take 100 mcg by mouth daily.      Historical Provider, MD  metoprolol (LOPRESSOR) 50 MG tablet Take 1 tablet (50 mg total) by mouth 2 (two) times daily. 06/08/15   Burtis Junes, NP  Multiple Vitamins-Minerals (ICAPS AREDS 2) CAPS Take 1 capsule by mouth 2 (two) times daily.    Historical Provider, MD  XARELTO 15 MG TABS tablet TAKE 1 TABLET BY MOUTH EVERY DAY 03/02/15   Thompson Grayer, MD    Family History Family  History  Problem Relation Age of Onset  . Cancer Father     lung cancer  . Alzheimer's disease Mother     Social History Social History  Substance Use Topics  . Smoking status: Former Smoker    Quit date: 03/05/1978  . Smokeless tobacco: Never Used  . Alcohol use No     Allergies   Penicillins; Tetanus toxoid; Demerol; and Codeine   Review of Systems Review of Systems  All other systems reviewed and are negative.    Physical Exam Updated Vital Signs BP 145/98   Pulse 93   Temp 97.7 F (36.5 C) (Oral)   Resp 19   Ht 5\' 5"  (1.651 m)   Wt 120 lb (54.4 kg)   SpO2 99%   BMI 19.97 kg/m   Physical Exam  Nursing note and vitals reviewed.  82 year old female, resting comfortably and  in no acute distress. Vital signs are significant for mild hypertension. Oxygen saturation is 99%, which is normal. Head is normocephalic and atraumatic. PERRLA, EOMI. Oropharynx is clear. Neck is nontender and supple without adenopathy or JVD. Back is nontender and there is no CVA tenderness. Lungs are clear without rales, wheezes, or rhonchi. Chest is nontender. Heart has an irregular rhythm without murmur. Abdomen is soft, flat, nontender without masses or hepatosplenomegaly and peristalsis is normoactive. Extremities have no cyanosis or edema, full range of motion is present. Skin is warm and dry without rash. Neurologic: Mental status is normal, cranial nerves are intact, there are no motor or sensory deficits.  ED Treatments / Results  Labs (all labs ordered are listed, but only abnormal results are displayed) Labs Reviewed  BASIC METABOLIC PANEL - Abnormal; Notable for the following:       Result Value   Glucose, Bld 107 (*)    BUN 25 (*)    Creatinine, Ser 1.24 (*)    GFR calc non Af Amer 39 (*)    GFR calc Af Amer 45 (*)    All other components within normal limits  CBC - Abnormal; Notable for the following:    MCV 102.4 (*)    All other components within normal limits  TROPONIN I - Abnormal; Notable for the following:    Troponin I 0.03 (*)    All other components within normal limits    EKG  EKG Interpretation  Date/Time:  Sunday January 29 2016 05:24:14 EST Ventricular Rate:  114 PR Interval:    QRS Duration: 75 QT Interval:  331 QTC Calculation: 456 R Axis:   61 Text Interpretation:  Atrial fibrillation Borderline T wave abnormalities When compared with ECG of 06/06/2015, No significant change was found Confirmed by [facility]  MD, Freedom Lopezperez ([id number]) on 01/29/2016 5:58:06 AM       Procedures Procedures (including critical care time)  Medications Ordered in ED Medications  metoprolol (LOPRESSOR) injection 2.5 mg (not administered)     Initial Impression /  Assessment and Plan / ED Course  I have reviewed the triage vital signs and the nursing notes.  Pertinent labs & imaging results that were available during my care of the patient were reviewed by me and considered in my medical decision making (see chart for details).  Clinical Course    Paroxysmal atrial fibrillation. Old records are reviewed, and she does have prior ED visits for similar complaints. She is currently anticoagulated on rivaroxaban. CHADS-VASC score is 4. Currently, heart rate is varying from mid 80s to mid 100 teens. She is given  a small dose of intravenous metoprolol.  Cardiac rate was controlled very well following metoprolol. Laboratory workup showed borderline troponin of 0.03 which is not felt to be clinically significant. Case is discussed with Dr. McDowell-on-call for [facility]. Patient is discharged home with instructions to call Dr. Martinique, her cardiologist, small. Advised that she can take extra dose of metoprolol if needed. Return if symptoms are not being adequately controlled at home.  Final Clinical Impressions(s) / ED Diagnoses   Final diagnoses:  Paroxysmal atrial fibrillation [facility])    New Prescriptions New Prescriptions   No medications on file     Delora Fuel, MD [id number] [phone]

## 2016-01-29 NOTE — ED Triage Notes (Addendum)
Brought by ems from home for c/o fluttering in chest.  Found to be in afib.  Rate varying between 70-140 with ems.  Per patient started yesterday at [place] with family physician.  Per his request took extra metroprolol around 4 pm then regular dose at 7 pm.  Woke up this morning with continued fluttering in chest.  Denies having any pain.  Does report SOB. [id number].

## 2016-01-30 ENCOUNTER — Encounter: Payer: Self-pay | Admitting: Physician Assistant

## 2016-01-30 ENCOUNTER — Ambulatory Visit (INDEPENDENT_AMBULATORY_CARE_PROVIDER_SITE_OTHER): Payer: Medicare Other | Admitting: Physician Assistant

## 2016-01-30 ENCOUNTER — Telehealth: Payer: Self-pay | Admitting: Cardiology

## 2016-01-30 VITALS — BP 118/70 | HR 88 | Ht 66.0 in | Wt 125.0 lb

## 2016-01-30 DIAGNOSIS — I350 Nonrheumatic aortic (valve) stenosis: Secondary | ICD-10-CM | POA: Diagnosis not present

## 2016-01-30 DIAGNOSIS — Z950 Presence of cardiac pacemaker: Secondary | ICD-10-CM

## 2016-01-30 DIAGNOSIS — I495 Sick sinus syndrome: Secondary | ICD-10-CM | POA: Diagnosis not present

## 2016-01-30 DIAGNOSIS — I480 Paroxysmal atrial fibrillation: Secondary | ICD-10-CM | POA: Diagnosis not present

## 2016-01-30 MED ORDER — METOPROLOL TARTRATE 50 MG PO TABS: 75.0000 mg | ORAL_TABLET | Freq: Two times a day (BID) | ORAL | 3 refills | Status: DC

## 2016-01-30 MED ORDER — DIGOXIN 125 MCG PO TABS: 0.1250 mg | ORAL_TABLET | Freq: Every day | ORAL | 3 refills | Status: DC

## 2016-01-30 NOTE — Telephone Encounter (Signed)
Returned call to patient.She stated she has been having Afib.Stated she woke up yesterday morning at 2:00 am with heart beating fast.Stated she called 911 and was taken to [facility] ER.Stated she was told to see cardiologist today.Appointment scheduled with Almyra Deforest PA today at 2:00 pm.

## 2016-01-30 NOTE — Progress Notes (Addendum)
Cardiology Office Note    Date:  01/30/2016   ID:  [name] [name], DOB [birth year], MRN [id number]  PCP:  [name], MD  Cardiologist:  Dr. Martinique  Primary electrophysiologist: Dr. [name]  Chief Complaint  Patient presents with  . Shortness of Breath    seen for Dr. Martinique, recurrent atrial fibrillation.    History of Present Illness:  [name] [name] is a 82 y.o. female with PMH of PAF on Xarelto, moderate AS, h/o tachy-brady syndrome s/p PPM. Echocardiogram in July 2015 showed a moderate aortic stenosis that has progressed since 2012. Repeat in July 2016 and July 2017 showed no change. In April 2017, she had atrial fibrillation that converted spontaneously. This was felt to be triggered by steroid injection. Her metoprolol was increased at the time. In June 2017, she was noted to be in persistent atrial fibrillation, she was placed on flecainide however later stopped due to side effect (lack of energy, not feeling right based on note 10/05/2015). She was last seen in cardiology office on 12/15/2015. She has been doing well for several months now, unfortunately, she woke up yesterday morning at 2:30 AM with palpitation and ended up going to the emergency room. She also had shortness of breath as well. This is the same feeling when she has recurrent atrial fibrillation. EKG in the ED confirmed atrial fibrillation with mildly elevated heart rate. Troponin was borderline at 0.03. Creatinine 1.24. Chest x-ray was negative.  Since her heart rate was borderline controlled, she was told to take an additional dose of metoprolol and subsequently discharged from the emergency room. Despite the fact on physical exam she is still having irregularly irregular heart rate. She is no longer having the same palpitation she has last night. It appears she only has cardiac awareness while her heart rate increase. She has been compliant with her Xarelto at home. She has not noticed any bleeding issues. It is  unclear to me what additional tablet of medication she has taken at home. She is under the impression that she was started on 75 mg twice a day of metoprolol, however her friend says she actually took some digoxin which is not on her medication list. Otherwise she is feeling well today without significant chest pain.    Past Medical History:  Diagnosis Date  . Aortic stenosis    mild  . Aortic stenosis   . Diverticulosis   . GERD (gastroesophageal reflux disease)   . Hemorrhoids   . Hyperlipidemia   . Hypertension   . Hypothyroidism   . Osteoarthritis   . Paroxysmal atrial fibrillation (HCC)   . Sick sinus syndrome (HCC)    s/p PPM (MDT)  . Syncope     Past Surgical History:  Procedure Laterality Date  . ABDOMINAL HYSTERECTOMY    . BASAL CELL CARCINOMA EXCISION    . INSERT / REPLACE / [place]   for syptomatic bradycardia and syncope -- in [facility]  . INSERT / REPLACE / REMOVE PACEMAKER  2001   pulse generator replacement by Dr. Rollene Fare   . INSERT / REPLACE / REMOVE PACEMAKER  04/01/2008   PPM Medtronic -- model # ADDRL1 serial # [id number] -- pulse generator replacement by Dr. Verlon Setting   . SQUAMOUS CELL CARCINOMA EXCISION      Current Medications: Outpatient Medications Prior to Visit  Medication Sig Dispense Refill  . calcium carbonate (OS-CAL) 600 MG TABS Take 600 mg by mouth daily.      Marland Kitchen  CARTIA XT 240 MG 24 hr capsule TAKE 1 CAPSULE BY MOUTH EVERY DAY 90 capsule 2  . Cholecalciferol (VITAMIN D) 1000 UNITS capsule Take 1,000 Units by mouth daily.      Marland Kitchen levothyroxine (SYNTHROID, LEVOTHROID) 100 MCG tablet Take 100 mcg by mouth daily.      . Multiple Vitamins-Minerals (ICAPS AREDS 2) CAPS Take 1 capsule by mouth 2 (two) times daily.    [name] [name] 15 MG TABS tablet TAKE 1 TABLET BY MOUTH EVERY DAY 90 tablet 3  . metoprolol (LOPRESSOR) 50 MG tablet Take 1 tablet (50 mg total) by mouth 2 (two) times daily. 180 tablet 3   No facility-administered  medications prior to visit.      Allergies:   Penicillins; Tetanus toxoid; Demerol; and Codeine   Social History   Social History  . Marital status: Widowed    Spouse name: N/A  . Number of children: 0  . Years of education: N/A   Occupational History  .  Retired   Social History Main Topics  . Smoking status: Former Smoker    Quit date: 03/05/1978  . Smokeless tobacco: Never Used  . Alcohol use No  . Drug use: No  . Sexual activity: Not Asked   Other Topics Concern  . None   Social History Narrative  . None     Family History:  The patient's family history includes Alzheimer's disease in her mother; Cancer in her father.   ROS:   Please see the history of present illness.    ROS All other systems reviewed and are negative.   PHYSICAL EXAM:   VS:  BP 118/70   Pulse 88   Ht 5\' 6"  (1.676 m)   Wt 125 lb (56.7 kg)   BMI 20.18 kg/m    GEN: Well nourished, well developed, in no acute distress  HEENT: normal  Neck: no JVD, carotid bruits, or masses Cardiac: irregularly irregular; no murmurs, rubs, or gallops,no edema  Respiratory:  clear to auscultation bilaterally, normal work of breathing GI: soft, nontender, nondistended, + BS MS: no deformity or atrophy  Skin: warm and dry, no rash Neuro:  Alert and Oriented x 3, Strength and sensation are intact Psych: euthymic mood, full affect  Wt Readings from Last 3 Encounters:  01/30/16 125 lb (56.7 kg)  01/29/16 120 lb (54.4 kg)  12/15/15 123 lb 6.4 oz (56 kg)      Studies/Labs Reviewed:   EKG:  EKG is not ordered today.    Recent Labs: 01/29/2016: BUN 25; Creatinine, Ser 1.24; Hemoglobin 13.9; Platelets 216; Potassium 4.0; Sodium 140   Lipid Panel No results found for: CHOL, TRIG, HDL, CHOLHDL, VLDL, LDLCALC, LDLDIRECT  Additional studies/ records that were reviewed today include:   Echo 08/18/2015 LV EF: 55% -   60%  - Left ventricle: The cavity size was normal. Wall thickness was   normal. Systolic  function was normal. The estimated ejection   fraction was in the range of 55% to 60%. Wall motion was normal;   there were no regional wall motion abnormalities. Features are   consistent with a pseudonormal left ventricular filling pattern,   with concomitant abnormal relaxation and increased filling   pressure (grade 2 diastolic dysfunction). - Aortic valve: There was moderate stenosis. There was mild to   moderate regurgitation directed centrally in the LVOT. - Mitral valve: There was mild regurgitation directed centrally. - Tricuspid valve: There was mild-moderate regurgitation directed   centrally. - Pulmonary arteries: Systolic  pressure was mildly increased. PA   peak pressure: 34 mm Hg (S).   ASSESSMENT:    1. Paroxysmal atrial fibrillation (HCC)   2. Moderate aortic stenosis   3. Tachy-brady syndrome ([place])   4. Cardiac pacemaker      PLAN:  In order of problems listed above:  1. PAF on Xarelto  - She had recurrent episode of paroxysmal atrial fibrillation that woke her up from sleep around 2:30 AM yesterday morning. Based on physical exam, she is still currently in atrial fibrillation however rate controlled. She is no longer having the same palpitation feeling. She was instructed to take additional dose of metoprolol at home, however I am unclear exactly what medications she has been taking, she is under the impression that she has increased her Toprol to 75 mg twice a day, however her friend told her she actually took additional dose of digoxin which is not on her medication list. The fact that she is no longer having palpitation feeling, I will continue rate control with 75 mg twice a day of metoprolol with addition of 0.125 mg daily of digoxin.  - I have also discussed with the patient returning electrical and chemical cardioversion. It appears she was continued on flecainide night for at least 2 weeks before she had the side effect. It may be reasonable to give her a single  dose of 300 mg flecainide for chemical cardioversion as I think she would be able to tolerate it. Alternatively, we can pursue electrical DC cardioversion with increased with control medication. However, given the recurrence of atrial fibrillation, other antiarrhythmic medication may need to be discussed including amiodarone and multaq. I will refer her to Dr. [name] for discussion of potential antiarrhythmic medications.  2. Moderate AS: Stable on yearly echocardiogram. Last echocardiogram in June 2017.  3. H/o tachy-brady syndrome s/p PPM: Follow-up with Dr. [name].    Medication Adjustments/Labs and Tests Ordered: Current medicines are reviewed at length with the patient today.  Concerns regarding medicines are outlined above.  Medication changes, Labs and Tests ordered today are listed in the Patient Instructions below. Patient Instructions  Medication Instructions:  INCREASE- Metoprolol 75 mg (1 1/2 tablets) twice a day START- Digoxin 0.125 mg daily  Labwork: None Ordered  Testing/Procedures: None Ordered  Follow-Up: You have been referred to Roderic Palau in [facility]   Any Other Special Instructions Will Be Listed Below (If Applicable).     If you need a refill on your cardiac medications before your next appointment, please call your pharmacy.      Hilbert Corrigan, [place]  01/30/2016 5:22 PM    [facility] [address], [place], [place]  [address] Phone: [phone]; Fax: [phone]

## 2016-01-30 NOTE — Patient Instructions (Signed)
Medication Instructions:  INCREASE- Metoprolol 75 mg (1 1/2 tablets) twice a day START- Digoxin 0.125 mg daily  Labwork: None Ordered  Testing/Procedures: None Ordered  Follow-Up: You have been referred to Roderic Palau in [facility]   Any Other Special Instructions Will Be Listed Below (If Applicable).     If you need a refill on your cardiac medications before your next appointment, please call your pharmacy.

## 2016-01-30 NOTE — Telephone Encounter (Signed)
New message   Pt call requesting to speak with RN about been seen by doctor Martinique today 11/27. Pt state she was seen in the hospital for Afib and was advised to be seen by her cardiologist. Please call back to discuss

## 2016-01-31 ENCOUNTER — Telehealth (HOSPITAL_COMMUNITY): Payer: Self-pay | Admitting: *Deleted

## 2016-01-31 ENCOUNTER — Telehealth: Payer: Self-pay | Admitting: Cardiology

## 2016-01-31 NOTE — Telephone Encounter (Signed)
Left vcml for pt to callback to sched an appt.

## 2016-01-31 NOTE — Telephone Encounter (Signed)
Returned call to patient.She stated she was feeling better this morning.Advised her she can do normal activities,avoid strenuous activity.Stated [place] PA increased metoprolol and started digoxin.Stated he wanted her to see AFib clinic.She has not received her appointment yet.Advised I will check with our schedulers about the appointment.

## 2016-01-31 NOTE — Telephone Encounter (Signed)
Patient has been experiencing a-fib and is currently on medication for it.  She wants to speak with you about how she is feeling; weather she should continue on with her normal activities or "cool it". Please call patient.

## 2016-02-01 ENCOUNTER — Ambulatory Visit: Payer: Medicare Other | Attending: Physical Medicine & Rehabilitation | Admitting: Physical Therapy

## 2016-02-01 NOTE — Telephone Encounter (Signed)
Spoke to patient she is aware of her appointment with AFib clinic tomorrow 02/02/16 at 10:30 am.

## 2016-02-01 NOTE — Telephone Encounter (Signed)
Follow up      Calling to get update on AFIB clinic referral

## 2016-02-01 NOTE — Telephone Encounter (Signed)
Spoke to patient. She doesn't recall receiving a phone call yet from A Fib clinic - informed her I would reach out and see if they can reach her. She can be reached at home or on mobile if not available there.

## 2016-02-01 NOTE — Telephone Encounter (Signed)
Spoke to [place], she acknowledges they called patient yesterday and did leave message. She will reach out today again to schedule.

## 2016-02-02 ENCOUNTER — Ambulatory Visit (HOSPITAL_COMMUNITY)
Admission: RE | Admit: 2016-02-02 | Discharge: 2016-02-02 | Disposition: A | Discharge status: Home / Self Care | Payer: Medicare Other | Source: Ambulatory Visit | Attending: Nurse Practitioner | Admitting: Nurse Practitioner

## 2016-02-02 ENCOUNTER — Encounter (HOSPITAL_COMMUNITY): Payer: Self-pay | Admitting: Nurse Practitioner

## 2016-02-02 VITALS — BP 100/66 | HR 105 | Ht 66.0 in | Wt 128.0 lb

## 2016-02-02 DIAGNOSIS — I4819 Other persistent atrial fibrillation: Secondary | ICD-10-CM

## 2016-02-02 DIAGNOSIS — E039 Hypothyroidism, unspecified: Secondary | ICD-10-CM | POA: Insufficient documentation

## 2016-02-02 DIAGNOSIS — I480 Paroxysmal atrial fibrillation: Secondary | ICD-10-CM | POA: Diagnosis present

## 2016-02-02 DIAGNOSIS — Z880 Allergy status to penicillin: Secondary | ICD-10-CM | POA: Diagnosis not present

## 2016-02-02 DIAGNOSIS — R9431 Abnormal electrocardiogram [ECG] [EKG]: Secondary | ICD-10-CM | POA: Insufficient documentation

## 2016-02-02 DIAGNOSIS — E785 Hyperlipidemia, unspecified: Secondary | ICD-10-CM | POA: Diagnosis not present

## 2016-02-02 DIAGNOSIS — I350 Nonrheumatic aortic (valve) stenosis: Secondary | ICD-10-CM | POA: Insufficient documentation

## 2016-02-02 DIAGNOSIS — Z85828 Personal history of other malignant neoplasm of skin: Secondary | ICD-10-CM | POA: Diagnosis not present

## 2016-02-02 DIAGNOSIS — Z7901 Long term (current) use of anticoagulants: Secondary | ICD-10-CM | POA: Diagnosis not present

## 2016-02-02 DIAGNOSIS — Z950 Presence of cardiac pacemaker: Secondary | ICD-10-CM | POA: Insufficient documentation

## 2016-02-02 DIAGNOSIS — I495 Sick sinus syndrome: Secondary | ICD-10-CM | POA: Insufficient documentation

## 2016-02-02 DIAGNOSIS — M1990 Unspecified osteoarthritis, unspecified site: Secondary | ICD-10-CM | POA: Insufficient documentation

## 2016-02-02 DIAGNOSIS — I10 Essential (primary) hypertension: Secondary | ICD-10-CM | POA: Diagnosis not present

## 2016-02-02 DIAGNOSIS — K219 Gastro-esophageal reflux disease without esophagitis: Secondary | ICD-10-CM | POA: Diagnosis not present

## 2016-02-02 DIAGNOSIS — I481 Persistent atrial fibrillation: Secondary | ICD-10-CM | POA: Diagnosis not present

## 2016-02-02 DIAGNOSIS — Z87891 Personal history of nicotine dependence: Secondary | ICD-10-CM | POA: Insufficient documentation

## 2016-02-02 NOTE — Progress Notes (Signed)
Primary Care Physician: [name], MD Cardiologist: Dr. Martinique Referring Physician: Almyra Deforest, PA    [name] [name] is a 82 y.o. female with a h/o PMH of PAF on Xarelto, moderate AS, h/o tachy-brady syndrome s/p PPM. Echocardiogram in July 2015 showed a moderate aortic stenosis that has progressed since 2012. Repeat in July 2016 and July 2017 showed no change. In April 2017, she had atrial fibrillation that converted spontaneously. This was felt to be triggered by steroid injection. Her metoprolol was increased at the time. In June 2017, she was noted to be in persistent atrial fibrillation, she was placed on flecainide however later stopped due to side effect (lack of energy, not feeling right based on note 10/05/2015). She was last seen in cardiology office on 12/15/2015. She has been doing well for several months now, unfortunately, she woke up 11/26 at 2:30 AM with palpitations and ended up going to the emergency room. She also had shortness of breath as well. This is the same feeling when she has recurrent atrial fibrillation. EKG in the ED confirmed atrial fibrillation with mildly elevated heart rate. Troponin was borderline at 0.03. Creatinine 1.24. Chest x-ray was negative.  She is in the afib clinic for further evaluation. When seen by M. Heng PA. she was still in afib x 48 hours,  now has been persistent  x 4 days. She in the past increased BB and would return to SR. She has not done that this time. She is rate controlled. [name] added digoxin. The pt has not seen any big difference in rate control with digoxin and wishes to stop drug. We discussed options to restore SR. Age would most likely prohibit ablation. She is least favorable of amiodarone due to S.E.profile and baseline thyroid issues.. Sotalol and tikosyn discussed but both drugs would have to be renally dosed with creatinine cl calculated at 30 today. She is tolerating the afib but feels better in SR with more energy and less  shortness of breath. She has appointment with Dr. [name] 12/14  for pacer evaluation and would like to think about options and discuss further with him then.  Today, she denies symptoms of palpitations, chest pain, shortness of breath, orthopnea, PND, lower extremity edema, dizziness, presyncope, syncope, or neurologic sequela. The patient is tolerating medications without difficulties and is otherwise without complaint today.   Past Medical History:  Diagnosis Date  . Aortic stenosis    mild  . Aortic stenosis   . Diverticulosis   . GERD (gastroesophageal reflux disease)   . Hemorrhoids   . Hyperlipidemia   . Hypertension   . Hypothyroidism   . Osteoarthritis   . Paroxysmal atrial fibrillation (HCC)   . Sick sinus syndrome (HCC)    s/p PPM (MDT)  . Syncope    Past Surgical History:  Procedure Laterality Date  . ABDOMINAL HYSTERECTOMY    . BASAL CELL CARCINOMA EXCISION    . INSERT / REPLACE / [place]   for syptomatic bradycardia and syncope -- in [facility]  . INSERT / REPLACE / REMOVE PACEMAKER  2001   pulse generator replacement by Dr. Rollene Fare   . INSERT / REPLACE / REMOVE PACEMAKER  04/01/2008   PPM Medtronic -- model # [id number] serial # [id number] -- pulse generator replacement by Dr. Verlon Setting   . SQUAMOUS CELL CARCINOMA EXCISION      Current Outpatient Prescriptions  Medication Sig Dispense Refill  . calcium carbonate (OS-CAL) 600 MG TABS  Take 600 mg by mouth daily.      Marland Kitchen CARTIA XT 240 MG 24 hr capsule TAKE 1 CAPSULE BY MOUTH EVERY DAY 90 capsule 2  . Cholecalciferol (VITAMIN D) 1000 UNITS capsule Take 1,000 Units by mouth daily.      . digoxin (LANOXIN) 0.125 MG tablet Take 1 tablet (0.125 mg total) by mouth daily. 90 tablet 3  . levothyroxine (SYNTHROID, LEVOTHROID) 100 MCG tablet Take 100 mcg by mouth daily.      . metoprolol (LOPRESSOR) 50 MG tablet Take 1.5 tablets (75 mg total) by mouth 2 (two) times daily. 270 tablet 3  . Multiple  Vitamins-Minerals (ICAPS AREDS 2) CAPS Take 1 capsule by mouth 2 (two) times daily.    Alveda Reasons 15 MG TABS tablet TAKE 1 TABLET BY MOUTH EVERY DAY 90 tablet 3   No current facility-administered medications for this encounter.     Allergies  Allergen Reactions  . Penicillins Anaphylaxis  . Tetanus Toxoid Anaphylaxis  . Demerol Other (See Comments)    Severe nausea  . Codeine Nausea Only    Severe nausea    Social History   Social History  . Marital status: Widowed    Spouse name: N/A  . Number of children: 0  . Years of education: N/A   Occupational History  .  Retired   Social History Main Topics  . Smoking status: Former Smoker    Quit date: 03/05/1978  . Smokeless tobacco: Never Used  . Alcohol use No  . Drug use: No  . Sexual activity: Not on file   Other Topics Concern  . Not on file   Social History Narrative  . No narrative on file    Family History  Problem Relation Age of Onset  . Cancer Father     lung cancer  . Alzheimer's disease Mother     ROS- All systems are reviewed and negative except as per the HPI above  Physical Exam: Vitals:   02/02/16 1041  BP: 100/66  Pulse: (!) 105  Weight: 128 lb (58.1 kg)  Height: 5\' 6"  (1.676 m)    GEN- The patient is well appearing, alert and oriented x 3 today.   Head- normocephalic, atraumatic Eyes-  Sclera clear, conjunctiva pink Ears- hearing intact Oropharynx- clear Neck- supple, no JVP Lymph- no cervical lymphadenopathy Lungs- Clear to ausculation bilaterally, normal work of breathing Heart- irregular rate and rhythm, no murmurs, rubs or gallops, PMI not laterally displaced GI- soft, NT, ND, + BS Extremities- no clubbing, cyanosis, or edema MS- no significant deformity or atrophy Skin- no rash or lesion Psych- euthymic mood, full affect Neuro- strength and sensation are intact  EKG- Afib at 105 bpm, qrs int 80 ms, qtc 412 ms Epic records reviewed  Assessment and Plan: 1. Paroxymal afib but   with more recent persistent afib x 4 days Discussed options with pt She would like to avoid amiodarone if possible with baseline thyroid issues Sotalol or tikosyn could be options but creatinine cl cal at 30 and both drugs would have to be renally dosed, possible hindering amount of drug/effectiveness of drug Qtc in SR less than 440 No drugs on board that would be contraindicated with tikosyn Stop digoxin, pt doesn't believe has helped rate control   2. Moderate AS Has been stable per echo  3.PPM- h/o  tachy/brady  Pt has appointment with Dr. [name] 12/14 and she would like to think over her options and discuss wth him then Call the  afib clinic if condition worsens   Geroge Baseman. Fedrick Cefalu, [facility] [address] [place], [place] [address] [phone]

## 2016-02-06 ENCOUNTER — Telehealth: Payer: Self-pay | Admitting: *Deleted

## 2016-02-06 NOTE — Telephone Encounter (Signed)
Dr [name] received a message from patient's PCP in regards to being seen sooner than already scheduled appointment next week.  Dr [name] is not in the office the rest of the week.  I let her know per Dr [name] she could be seen in the afib clinic this week if needed and to call to schedule if wanting a sooner appointment.

## 2016-02-07 ENCOUNTER — Telehealth: Payer: Self-pay | Admitting: Physical Therapy

## 2016-02-07 ENCOUNTER — Ambulatory Visit (HOSPITAL_COMMUNITY)
Admission: RE | Admit: 2016-02-07 | Discharge: 2016-02-07 | Disposition: A | Discharge status: Home / Self Care | Payer: Medicare Other | Source: Ambulatory Visit | Attending: Nurse Practitioner | Admitting: Nurse Practitioner

## 2016-02-07 ENCOUNTER — Encounter (HOSPITAL_COMMUNITY): Payer: Self-pay | Admitting: Nurse Practitioner

## 2016-02-07 VITALS — BP 112/76 | HR 94 | Ht 66.0 in | Wt 130.0 lb

## 2016-02-07 DIAGNOSIS — E039 Hypothyroidism, unspecified: Secondary | ICD-10-CM | POA: Insufficient documentation

## 2016-02-07 DIAGNOSIS — E785 Hyperlipidemia, unspecified: Secondary | ICD-10-CM | POA: Diagnosis not present

## 2016-02-07 DIAGNOSIS — I480 Paroxysmal atrial fibrillation: Secondary | ICD-10-CM | POA: Diagnosis present

## 2016-02-07 DIAGNOSIS — I10 Essential (primary) hypertension: Secondary | ICD-10-CM | POA: Insufficient documentation

## 2016-02-07 DIAGNOSIS — Z880 Allergy status to penicillin: Secondary | ICD-10-CM | POA: Insufficient documentation

## 2016-02-07 DIAGNOSIS — I495 Sick sinus syndrome: Secondary | ICD-10-CM | POA: Diagnosis not present

## 2016-02-07 DIAGNOSIS — K219 Gastro-esophageal reflux disease without esophagitis: Secondary | ICD-10-CM | POA: Diagnosis not present

## 2016-02-07 DIAGNOSIS — I481 Persistent atrial fibrillation: Secondary | ICD-10-CM

## 2016-02-07 DIAGNOSIS — Z7901 Long term (current) use of anticoagulants: Secondary | ICD-10-CM | POA: Insufficient documentation

## 2016-02-07 DIAGNOSIS — I350 Nonrheumatic aortic (valve) stenosis: Secondary | ICD-10-CM | POA: Diagnosis not present

## 2016-02-07 DIAGNOSIS — M1990 Unspecified osteoarthritis, unspecified site: Secondary | ICD-10-CM | POA: Insufficient documentation

## 2016-02-07 DIAGNOSIS — I4819 Other persistent atrial fibrillation: Secondary | ICD-10-CM

## 2016-02-07 DIAGNOSIS — R9431 Abnormal electrocardiogram [ECG] [EKG]: Secondary | ICD-10-CM | POA: Insufficient documentation

## 2016-02-07 DIAGNOSIS — Z87891 Personal history of nicotine dependence: Secondary | ICD-10-CM | POA: Insufficient documentation

## 2016-02-07 DIAGNOSIS — R609 Edema, unspecified: Secondary | ICD-10-CM | POA: Diagnosis not present

## 2016-02-07 DIAGNOSIS — R635 Abnormal weight gain: Secondary | ICD-10-CM | POA: Diagnosis not present

## 2016-02-07 MED ORDER — METOPROLOL TARTRATE 50 MG PO TABS: 50.0000 mg | ORAL_TABLET | Freq: Two times a day (BID) | ORAL | 3 refills | Status: DC

## 2016-02-07 MED ORDER — AMIODARONE HCL 200 MG PO TABS: 200.0000 mg | ORAL_TABLET | Freq: Two times a day (BID) | ORAL | 3 refills | Status: DC

## 2016-02-07 MED ORDER — FUROSEMIDE 20 MG PO TABS: 20.0000 mg | ORAL_TABLET | Freq: Every day | ORAL | 6 refills | Status: DC

## 2016-02-07 NOTE — Patient Instructions (Signed)
Your physician has recommended you make the following change in your medication:  1)Lasix 20mg  once a day -- start this tomorrow morning 2)Amiodarone 200mg  twice a day -- start this tonight 3)Decrease metoprolol 50mg  twice a day  pulmonary function tests --  no caffeine, inhalers or smoking 4 hours prior to test.  Go to main entrance and check in with admitting - 15 minutes prior to appointment

## 2016-02-07 NOTE — Telephone Encounter (Signed)
02/01/16 no show for PT eval 02/07/16 left message re: rescheduling PT eval

## 2016-02-07 NOTE — Progress Notes (Addendum)
Primary Care Physician: [name], MD Cardiologist: Dr. Martinique Referring Physician: Almyra Deforest, PA    [name] [name] is a 82 y.o. female with a h/o PMH of PAF on Xarelto, moderate AS, h/o tachy-brady syndrome s/p PPM. Echocardiogram in July 2015 showed a moderate aortic stenosis that has progressed since 2012. Repeat in July 2016 and July 2017 showed no change. In April 2017, she had atrial fibrillation that converted spontaneously. This was felt to be triggered by steroid injection. Her metoprolol was increased at the time. In June 2017, she was noted to be in persistent atrial fibrillation, she was placed on flecainide however later stopped due to side effect (lack of energy, not feeling right based on note 10/05/2015). She was last seen in cardiology office on 12/15/2015. She has been doing well for several months now, unfortunately, she woke up 11/26 at 2:30 AM with palpitations and ended up going to the emergency room. She also had shortness of breath as well. This is the same feeling when she has recurrent atrial fibrillation. EKG in the ED confirmed atrial fibrillation with mildly elevated heart rate. Troponin was borderline at 0.03. Creatinine 1.24. Chest x-ray was negative.  She is in the afib clinic for further evaluation. When seen by M. Heng PA. she was still in afib x 48 hours,  now has been persistent  x 4 days. She in the past increased BB and would return to SR. She has not done that this time. She is rate controlled. [name] added digoxin. The pt has not seen any big difference in rate control with digoxin and wishes to stop drug. We discussed options to restore SR. Age would most likely prohibit ablation. She is least favorable of amiodarone due to S.E.profile and baseline thyroid issues.. Sotalol and tikosyn discussed but both drugs would have to be renally dosed with creatinine cl calculated at 30 today. She is tolerating the afib but feels better in SR with more energy and less  shortness of breath. She has appointment with Dr. [name] 12/14  for pacer evaluation and would like to think about options and discuss further with him then.  Pt returns to afib clinic to discuss management of afib. She is getting more short of breath and has gained 6-8 lbs. She saw her PCP yesterday and thyroid function was in range and he was ok for her to go on amiodarone if needed. She would like to avoid hospitalization and believes she would like to try amiodarone.  She has noticed LLE and some orthopnea at night.  Today, she denies symptoms of  chest pain, PND, , dizziness, presyncope, syncope, or neurologic sequela. Positive for orthopnea, LLE , weight gain, shortness of breath. The patient is tolerating medications without difficulties and is otherwise without complaint today.   Past Medical History:  Diagnosis Date  . Aortic stenosis    mild  . Aortic stenosis   . Diverticulosis   . GERD (gastroesophageal reflux disease)   . Hemorrhoids   . Hyperlipidemia   . Hypertension   . Hypothyroidism   . Osteoarthritis   . Paroxysmal atrial fibrillation (HCC)   . Sick sinus syndrome (HCC)    s/p PPM (MDT)  . Syncope    Past Surgical History:  Procedure Laterality Date  . ABDOMINAL HYSTERECTOMY    . BASAL CELL CARCINOMA EXCISION    . INSERT / REPLACE / [place]   for syptomatic bradycardia and syncope -- in [facility]  .  INSERT / REPLACE / REMOVE PACEMAKER  2001   pulse generator replacement by Dr. Rollene Fare   . INSERT / REPLACE / REMOVE PACEMAKER  04/01/2008   PPM Medtronic -- model # [id number] serial # [id number] -- pulse generator replacement by Dr. Verlon Setting   . SQUAMOUS CELL CARCINOMA EXCISION      Current Outpatient Prescriptions  Medication Sig Dispense Refill  . calcium carbonate (OS-CAL) 600 MG TABS Take 600 mg by mouth daily.      Marland Kitchen CARTIA XT 240 MG 24 hr capsule TAKE 1 CAPSULE BY MOUTH EVERY DAY 90 capsule 2  . Cholecalciferol (VITAMIN D) 1000 UNITS capsule  Take 1,000 Units by mouth daily.      Marland Kitchen levothyroxine (SYNTHROID, LEVOTHROID) 100 MCG tablet Take 100 mcg by mouth daily.      . metoprolol (LOPRESSOR) 50 MG tablet Take 1 tablet (50 mg total) by mouth 2 (two) times daily. 270 tablet 3  . Multiple Vitamins-Minerals (ICAPS AREDS 2) CAPS Take 1 capsule by mouth 2 (two) times daily.    Alveda Reasons 15 MG TABS tablet TAKE 1 TABLET BY MOUTH EVERY DAY 90 tablet 3  . amiodarone (PACERONE) 200 MG tablet Take 1 tablet (200 mg total) by mouth 2 (two) times daily. 60 tablet 3  . furosemide (LASIX) 20 MG tablet Take 1 tablet (20 mg total) by mouth daily. 30 tablet 6   No current facility-administered medications for this encounter.     Allergies  Allergen Reactions  . Penicillins Anaphylaxis  . Tetanus Toxoid Anaphylaxis  . Demerol Other (See Comments)    Severe nausea  . Codeine Nausea Only    Severe nausea    Social History   Social History  . Marital status: Widowed    Spouse name: N/A  . Number of children: 0  . Years of education: N/A   Occupational History  .  Retired   Social History Main Topics  . Smoking status: Former Smoker    Quit date: 03/05/1978  . Smokeless tobacco: Never Used  . Alcohol use No  . Drug use: No  . Sexual activity: Not on file   Other Topics Concern  . Not on file   Social History Narrative  . No narrative on file    Family History  Problem Relation Age of Onset  . Cancer Father     lung cancer  . Alzheimer's disease Mother     ROS- All systems are reviewed and negative except as per the HPI above  Physical Exam: Vitals:   02/07/16 1337  BP: 112/76  Pulse: 94  Weight: 130 lb (59 kg)  Height: 5\' 6"  (1.676 m)    GEN- The patient is well appearing, alert and oriented x 3 today.   Head- normocephalic, atraumatic Eyes-  Sclera clear, conjunctiva pink Ears- hearing intact Oropharynx- clear Neck- supple, no JVP Lymph- no cervical lymphadenopathy Lungs- Clear to ausculation bilaterally,  normal work of breathing Heart- irregular rate and rhythm, 1-2/6 sys murmur, rubs or gallops, PMI not laterally displaced GI- soft, NT, ND, + BS Extremities- no clubbing, cyanosis, or 2+ bilateral edema MS- no significant deformity or atrophy Skin- no rash or lesion Psych- euthymic mood, full affect Neuro- strength and sensation are intact  EKG- Afib at 94 bpm, qrs int 88 ms, qtc 412 ms Epic records reviewed  Assessment and Plan: 1.Persistent afib  Discussed options with pt She would like to start amiodarone, and will rx 200 mg bid Decrease metoprolol to 50 mg  bid  Baseline tsh/ liver obtained from PCP and is normal range Baseline PFT's scheduled  2. Weight gain, edema Start lasix 20 mg a day Weight daily Avoid salt   3. Moderate AS Has been stable per echo  3.PPM- h/o  tachy/brady  Pt has appointment with Dr. [name] 12/14 and he can follow up on plan/symptoms at that visit Will need bmet with start of lasix Call the afib clinic if condition worsens   Butch Penny C. Calayah Guadarrama, [facility] [address] [place], [place] [address] [phone]

## 2016-02-10 ENCOUNTER — Encounter: Payer: Self-pay | Admitting: Internal Medicine

## 2016-02-14 ENCOUNTER — Ambulatory Visit (HOSPITAL_COMMUNITY)
Admission: RE | Admit: 2016-02-14 | Discharge: 2016-02-14 | Disposition: A | Discharge status: Home / Self Care | Payer: Medicare Other | Source: Ambulatory Visit | Attending: Nurse Practitioner | Admitting: Nurse Practitioner

## 2016-02-14 DIAGNOSIS — I4819 Other persistent atrial fibrillation: Secondary | ICD-10-CM

## 2016-02-14 DIAGNOSIS — I481 Persistent atrial fibrillation: Secondary | ICD-10-CM | POA: Insufficient documentation

## 2016-02-14 LAB — PULMONARY FUNCTION TEST
DL/VA % pred: 77 %
DL/VA: 3.79 ml/min/mmHg/L
DLCO unc % pred: 47 %
DLCO unc: 12.25 ml/min/mmHg
FEF 25-75 Pre: 0.77 L/sec
FEF2575-%PRED-PRE: 62 %
FEV1-%PRED-PRE: 68 %
FEV1-Pre: 1.28 L
FEV1FVC-%PRED-PRE: 95 %
FEV6-%PRED-PRE: 77 %
FEV6-PRE: 1.85 L
FEV6FVC-%Pred-Pre: 106 %
FVC-%Pred-Pre: 72 %
FVC-Pre: 1.85 L
PRE FEV1/FVC RATIO: 69 %
Pre FEV6/FVC Ratio: 100 %
RV % PRED: 106 %
RV: 2.7 L
TLC % PRED: 87 %
TLC: 4.57 L

## 2016-02-14 NOTE — Progress Notes (Signed)
02/14/2016- Respiratory care note- pt unable to use albuterol for post spirometry due to afib and pt declined medication.  Pt to see MD Thursday concerning heart.  Pt tolerated PFT well.

## 2016-02-16 ENCOUNTER — Ambulatory Visit (INDEPENDENT_AMBULATORY_CARE_PROVIDER_SITE_OTHER): Payer: Medicare Other | Admitting: Internal Medicine

## 2016-02-16 VITALS — BP 138/80 | HR 79 | Ht 65.0 in | Wt 124.2 lb

## 2016-02-16 DIAGNOSIS — I10 Essential (primary) hypertension: Secondary | ICD-10-CM | POA: Diagnosis not present

## 2016-02-16 DIAGNOSIS — I4819 Other persistent atrial fibrillation: Secondary | ICD-10-CM

## 2016-02-16 DIAGNOSIS — I495 Sick sinus syndrome: Secondary | ICD-10-CM

## 2016-02-16 DIAGNOSIS — I481 Persistent atrial fibrillation: Secondary | ICD-10-CM

## 2016-02-16 DIAGNOSIS — Z950 Presence of cardiac pacemaker: Secondary | ICD-10-CM

## 2016-02-16 MED ORDER — FUROSEMIDE 20 MG PO TABS: 20.0000 mg | ORAL_TABLET | Freq: Every day | ORAL | 6 refills | Status: DC | PRN

## 2016-02-16 MED ORDER — AMIODARONE HCL 200 MG PO TABS: 200.0000 mg | ORAL_TABLET | Freq: Every day | ORAL | 3 refills | Status: DC

## 2016-02-16 NOTE — Progress Notes (Signed)
PCP: [name], MD Primary Cardiologist:  Dr Martinique  [name] [name] is a 82 y.o. female who presents today for routine electrophysiology followup. She has recently had issues with afib.  She has seen Butch Penny in the AF clinic and has started amiodarone.  She is doing "much better" with amiodarone.  She has converted to normal rhythm and is pleased with current health state.  She has lost 5 lbs with diuresis and SOB is resolved.  She has a "runny nose" which she attributes to metoprolol.   Today, she denies symptoms of palpitations, chest pain, dizziness, presyncope, or syncope.  She has mild SOB which is stable.   The patient is otherwise without complaint today.   Past Medical History:  Diagnosis Date  . Aortic stenosis    mild  . Aortic stenosis   . Diverticulosis   . GERD (gastroesophageal reflux disease)   . Hemorrhoids   . Hyperlipidemia   . Hypertension   . Hypothyroidism   . Osteoarthritis   . Paroxysmal atrial fibrillation (HCC)   . Sick sinus syndrome (HCC)    s/p PPM (MDT)  . Syncope    Past Surgical History:  Procedure Laterality Date  . ABDOMINAL HYSTERECTOMY    . BASAL CELL CARCINOMA EXCISION    . INSERT / REPLACE / [place]   for syptomatic bradycardia and syncope -- in [facility]  . INSERT / REPLACE / REMOVE PACEMAKER  2001   pulse generator replacement by Dr. Rollene Fare   . INSERT / REPLACE / REMOVE PACEMAKER  04/01/2008   PPM Medtronic -- model # [id number] serial # [id number] -- pulse generator replacement by Dr. Verlon Setting   . SQUAMOUS CELL CARCINOMA EXCISION      Current Outpatient Prescriptions  Medication Sig Dispense Refill  . amiodarone (PACERONE) 200 MG tablet Take 1 tablet (200 mg total) by mouth 2 (two) times daily. 60 tablet 3  . calcium carbonate (OS-CAL) 600 MG TABS Take 600 mg by mouth daily.      Marland Kitchen CARTIA XT 240 MG 24 hr capsule TAKE 1 CAPSULE BY MOUTH EVERY DAY 90 capsule 2  . Cholecalciferol (VITAMIN D) 1000 UNITS capsule Take 1,000  Units by mouth daily.      . furosemide (LASIX) 20 MG tablet Take 1 tablet (20 mg total) by mouth daily. 30 tablet 6  . levothyroxine (SYNTHROID, LEVOTHROID) 100 MCG tablet Take 100 mcg by mouth daily.      . metoprolol (LOPRESSOR) 50 MG tablet Take 1 tablet (50 mg total) by mouth 2 (two) times daily. 270 tablet 3  . Multiple Vitamins-Minerals (ICAPS AREDS 2) CAPS Take 1 capsule by mouth 2 (two) times daily.    Alveda Reasons 15 MG TABS tablet TAKE 1 TABLET BY MOUTH EVERY DAY 90 tablet 3   No current facility-administered medications for this visit.     Physical Exam: Vitals:   02/16/16 0948  BP: 138/80  Pulse: 79  Weight: 124 lb 3.2 oz (56.3 kg)  Height: 5\' 5"  (1.651 m)    GEN- The patient is well appearing, alert and oriented x 3 today.   Head- normocephalic, atraumatic Eyes-  Sclera clear, conjunctiva pink Ears- hearing intact Oropharynx- clear Lungs- Clear to ausculation bilaterally, normal work of breathing Chest- pacemaker pocket is well healed Heart- Regular rate and rhythm, 2/6 SEM LUSB (mid peaking), 3/6 holosystolic murmur at the apex which is quite prominent today GI- soft, NT, ND, + BS Extremities- no clubbing, cyanosis, or edema  Pacemaker interrogation- reviewed in detail today,  See PACEART report ekg today reveals atrial paced rhythm, QTc 426 msec  AF clinic and Dr Keane Police notes are reviewed  Assessment and Plan: BRADYCARDIA-TACHYCARDIA SYNDROME   Normal pacemaker function  See Pace Art report  No changes today  RV threshold is chronically elevated though she does not RV pace  ATRIAL FIBRILLATION  Continue xarelto Doing well with amiodarone Continue amiodarone 200mg  BID x 2 weeks then reduce to 200mg  daily Will consider reducing metoprolol upon return Baseline PFTs reviewed today.  AS/ MR Stable  Followed by Dr Martinique  Follow-up with Dr Martinique as scheduled Remote monitroing I will see again in 4-6 weeks  Thompson Grayer MD, [facility] 02/16/2016 10:17  AM

## 2016-02-16 NOTE — Patient Instructions (Signed)
Medication Instructions:  Your physician has recommended you make the following change in your medication:  1) Decrease Amiodarone to 200 mg daily on 03/01/16 2) Take Furosemide as needed---weigh yourself daily   Labwork: None ordered   Testing/Procedures: None ordered   Follow-Up: Your physician recommends that you schedule a follow-up appointment in: 6 weeks with Dr [name]   Low-Sodium Eating Plan Sodium raises blood pressure and causes water to be held in the body. Getting less sodium from food will help lower your blood pressure, reduce any swelling, and protect your heart, liver, and kidneys. We get sodium by adding salt (sodium chloride) to food. Most of our sodium comes from canned, boxed, and frozen foods. Restaurant foods, fast foods, and pizza are also very high in sodium. Even if you take medicine to lower your blood pressure or to reduce fluid in your body, getting less sodium from your food is important. What is my plan? Most people should limit their sodium intake to 2,300 mg a day. Your health care provider recommends that you limit your sodium intake to 2 grams  a day. What do I need to know about this eating plan? For the low-sodium eating plan, you will follow these general guidelines:  Choose foods with a % Daily Value for sodium of less than 5% (as listed on the food label).  Use salt-free seasonings or herbs instead of table salt or sea salt.  Check with your health care provider or pharmacist before using salt substitutes.  Eat fresh foods.  Eat more vegetables and fruits.  Limit canned vegetables. If you do use them, rinse them well to decrease the sodium.  Limit cheese to 1 oz (28 g) per day.  Eat lower-sodium products, often labeled as "lower sodium" or "no salt added."  Avoid foods that contain monosodium glutamate (MSG). MSG is sometimes added to [place] food and some canned foods.  Check food labels (Nutrition Facts labels) on foods to learn how  much sodium is in one serving.  Eat more home-cooked food and less restaurant, buffet, and fast food.  When eating at a restaurant, ask that your food be prepared with less salt, or no salt if possible. How do I read food labels for sodium information? The Nutrition Facts label lists the amount of sodium in one serving of the food. If you eat more than one serving, you must multiply the listed amount of sodium by the number of servings. Food labels may also identify foods as:  Sodium free-Less than 5 mg in a serving.  Very low sodium-35 mg or less in a serving.  Low sodium-140 mg or less in a serving.  Light in sodium-50% less sodium in a serving. For example, if a food that usually has 300 mg of sodium is changed to become light in sodium, it will have 150 mg of sodium.  Reduced sodium-25% less sodium in a serving. For example, if a food that usually has 400 mg of sodium is changed to reduced sodium, it will have 300 mg of sodium. What foods can I eat? Grains  Low-sodium cereals, including oats, puffed wheat and rice, and shredded wheat cereals. Low-sodium crackers. Unsalted rice and pasta. Lower-sodium bread. Vegetables  Frozen or fresh vegetables. Low-sodium or reduced-sodium canned vegetables. Low-sodium or reduced-sodium tomato sauce and paste. Low-sodium or reduced-sodium tomato and vegetable juices. Fruits  Fresh, frozen, and canned fruit. Fruit juice. Meat and Other Protein Products  Low-sodium canned tuna and salmon. Fresh or frozen meat, poultry, seafood,  and fish. Lamb. Unsalted nuts. Dried beans, peas, and lentils without added salt. Unsalted canned beans. Homemade soups without salt. Eggs. Dairy  Milk. Soy milk. Ricotta cheese. Low-sodium or reduced-sodium cheeses. Yogurt. Condiments  Fresh and dried herbs and spices. Salt-free seasonings. Onion and garlic powders. Low-sodium varieties of mustard and ketchup. Fresh or refrigerated horseradish. Lemon juice. Fats and Oils    Reduced-sodium salad dressings. Unsalted butter. Other  Unsalted popcorn and pretzels. The items listed above may not be a complete list of recommended foods or beverages. Contact your dietitian for more options.  What foods are not recommended? Grains  Instant hot cereals. Bread stuffing, pancake, and biscuit mixes. Croutons. Seasoned rice or pasta mixes. Noodle soup cups. Boxed or frozen macaroni and cheese. Self-rising flour. Regular salted crackers. Vegetables  Regular canned vegetables. Regular canned tomato sauce and paste. Regular tomato and vegetable juices. Frozen vegetables in sauces. Salted [place] fries. Olives. Angie Fava. Relishes. Sauerkraut. Salsa. Meat and Other Protein Products  Salted, canned, smoked, spiced, or pickled meats, seafood, or fish. Bacon, ham, sausage, hot dogs, corned beef, chipped beef, and packaged luncheon meats. Salt pork. Jerky. Pickled herring. Anchovies, regular canned tuna, and sardines. Salted nuts. Dairy  Processed cheese and cheese spreads. Cheese curds. Blue cheese and cottage cheese. Buttermilk. Condiments  Onion and garlic salt, seasoned salt, table salt, and sea salt. Canned and packaged gravies. Worcestershire sauce. Tartar sauce. Barbecue sauce. Teriyaki sauce. Soy sauce, including reduced sodium. Steak sauce. Fish sauce. Oyster sauce. Cocktail sauce. Horseradish that you find on the shelf. Regular ketchup and mustard. Meat flavorings and tenderizers. Bouillon cubes. Hot sauce. Tabasco sauce. Marinades. Taco seasonings. Relishes. Fats and Oils  Regular salad dressings. Salted butter. Margarine. Ghee. Bacon fat. Other  Potato and tortilla chips. Corn chips and puffs. Salted popcorn and pretzels. Canned or dried soups. Pizza. Frozen entrees and pot pies. The items listed above may not be a complete list of foods and beverages to avoid. Contact your dietitian for more information.  This information is not intended to replace advice given to you by your  health care provider. Make sure you discuss any questions you have with your health care provider. Document Released: 08/11/2001 Document Revised: 07/28/2015 Document Reviewed: 12/24/2012 Elsevier Interactive Patient Education  2017 [employer].

## 2016-03-02 ENCOUNTER — Other Ambulatory Visit: Payer: Self-pay | Admitting: Internal Medicine

## 2016-03-16 ENCOUNTER — Ambulatory Visit (INDEPENDENT_AMBULATORY_CARE_PROVIDER_SITE_OTHER): Payer: Medicare Other | Admitting: Internal Medicine

## 2016-03-16 ENCOUNTER — Encounter: Payer: Self-pay | Admitting: Internal Medicine

## 2016-03-16 ENCOUNTER — Other Ambulatory Visit (INDEPENDENT_AMBULATORY_CARE_PROVIDER_SITE_OTHER): Payer: Medicare Other

## 2016-03-16 ENCOUNTER — Telehealth: Payer: Self-pay

## 2016-03-16 VITALS — BP 136/62 | HR 62 | Ht 65.0 in | Wt 127.0 lb

## 2016-03-16 DIAGNOSIS — D539 Nutritional anemia, unspecified: Secondary | ICD-10-CM

## 2016-03-16 DIAGNOSIS — R1013 Epigastric pain: Secondary | ICD-10-CM

## 2016-03-16 DIAGNOSIS — R195 Other fecal abnormalities: Secondary | ICD-10-CM

## 2016-03-16 DIAGNOSIS — K625 Hemorrhage of anus and rectum: Secondary | ICD-10-CM

## 2016-03-16 DIAGNOSIS — D509 Iron deficiency anemia, unspecified: Secondary | ICD-10-CM

## 2016-03-16 LAB — VITAMIN B12: Vitamin B-12: 261 pg/mL (ref 211–911)

## 2016-03-16 LAB — FOLATE: Folate: 19.6 ng/mL (ref >5.9)

## 2016-03-16 MED ORDER — NA SULFATE-K SULFATE-MG SULF 17.5-3.13-1.6 GM/177ML PO SOLN: 1.0000 | Freq: Once | ORAL | 0 refills | Status: AC

## 2016-03-16 NOTE — Patient Instructions (Signed)

## 2016-03-16 NOTE — Progress Notes (Signed)
HISTORY OF PRESENT ILLNESS:  [name] [name] is a 83 y.o. female with multiple medical problems including aortic stenosis, hypertension, hyperlipidemia, and paroxysmal atrial fibrillation for which she is on amiodarone and chronic Xarelto therapy. Patient is sent today by her primary care provider Dr. Virgina Jock regarding anemia and Hemoccult-positive stool. As well, recent problems with epigastric pain. The patient was last evaluated in this office June 2015 for minor rectal bleeding secondary to internal hemorrhoids. Previous colonoscopy 2002 with diverticulosis and hemorrhoids. She also has a history of GERD and is on Nexium. Patient reports that she was having issues with epigastric discomfort. She was placed on Nexium which she states helped. She was noted to be mildly anemic with a hemoglobin level of 11.6. Elevated MCV at 112.2. Iron saturation low at 10% suggesting iron deficiency. Hemoccult studies were performed and found to be positive 3. Patient does report occasionally seeing blood per rectum. Sometimes blood on the stool. She denies difficulties with her bowel habits or weight loss. No prior history of stroke.  REVIEW OF SYSTEMS:  All non-GI ROS negative except for arthritis, sinus and allergy, irregular heart rate, night sweats, urinary leakage, ankle swelling  Past Medical History:  Diagnosis Date  . Aortic stenosis    mild  . Aortic stenosis   . Diverticulosis   . GERD (gastroesophageal reflux disease)   . Hemorrhoids   . Hyperlipidemia   . Hypertension   . Hypothyroidism   . Osteoarthritis   . Paroxysmal atrial fibrillation (HCC)   . Sick sinus syndrome (HCC)    s/p PPM (MDT)  . Syncope     Past Surgical History:  Procedure Laterality Date  . ABDOMINAL HYSTERECTOMY    . BASAL CELL CARCINOMA EXCISION    . INSERT / REPLACE / Tool   for syptomatic bradycardia and syncope -- in [facility]  . INSERT / REPLACE / REMOVE PACEMAKER  2001   pulse generator  replacement by Dr. Rollene Fare   . INSERT / REPLACE / REMOVE PACEMAKER  04/01/2008   PPM Medtronic -- model # ADDRL1 serial # [id number] -- pulse generator replacement by Dr. Verlon Setting   . SQUAMOUS CELL CARCINOMA EXCISION      Social History KEYARIA PITTSER  reports that she quit smoking about 38 years ago. She has never used smokeless tobacco. She reports that she does not drink alcohol or use drugs.  family history includes Alzheimer's disease in her mother; Cancer in her father.  Allergies  Allergen Reactions  . Penicillins Anaphylaxis  . Tetanus Toxoid Anaphylaxis  . Demerol Other (See Comments)    Severe nausea  . Codeine Nausea Only    Severe nausea       PHYSICAL EXAMINATION: Vital signs: BP 136/62   Pulse 62   Ht 5\' 5"  (1.651 m)   Wt 127 lb (57.6 kg)   BMI 21.13 kg/m   Constitutional: Pleasant, generally well-appearing, no acute distress Psychiatric: alert and oriented x3, cooperative Eyes: extraocular movements intact, anicteric, conjunctiva pink Mouth: oral pharynx moist, no lesions Neck: supple without thyromegaly Lymph: no lymphadenopathy Cardiovascular: heart regular rate and rhythm, 2/6 systolic ejection murmur Lungs: clear to auscultation bilaterally Abdomen: soft, nontender, nondistended, no obvious ascites, no peritoneal signs, normal bowel sounds, no organomegaly Rectal: Deferred until colonoscopy Extremities: no clubbing cyanosis or lower extremity edema bilaterally Skin: no lesions on visible extremities Neuro: No focal deficits. Cranial nerves intact  ASSESSMENT:  #1. Iron deficiency anemia #2. Macrocytosis. Possibly related amiodarone #3. Hemoccult-positive  stool #4. Reports of rectal bleeding #5. Epigastric pain improved on Nexium. Suspect GERD, possibly ulcer. #6. Remote colonoscopy 2002 with diverticulosis and internal hemorrhoids #7. Multiple medical problems including a history of atrial fibrillation for which she is on Xarelto   PLAN:  #1.  Colonoscopy to evaluate iron deficiency anemia, Hemoccult-positive stool, and rectal bleeding. The patient is high risk given her age and comorbidities.The nature of the procedure, as well as the risks, benefits, and alternatives were carefully and thoroughly reviewed with the patient. Ample time for discussion and questions allowed. The patient understood, was satisfied, and agreed to proceed. #2. Upper endoscopy to evaluate epigastric pain, iron deficiency anemia, and Hemoccult-positive stool. The patient is high-risk given her age and comorbidities.The nature of the procedure, as well as the risks, benefits, and alternatives were carefully and thoroughly reviewed with the patient. Ample time for discussion and questions allowed. The patient understood, was satisfied, and agreed to proceed. #3. Continue PPI #4. B12 and folate levels given macrocytosis and anemia #5. Hold Xarelto 3 days prior to the procedures. Will confer with her cardiologist regarding the acceptability. Would anticipate immediate resumption of Xarelto post procedure   A copy of this consultation note has been sent to Dr. Virgina Jock and Dr. [name]

## 2016-03-16 NOTE — Telephone Encounter (Signed)
Spoke to patient [name] advised ok to hold Xarelto 2 days prior to procedures.Message sent to [place].

## 2016-03-16 NOTE — Telephone Encounter (Signed)
  03/16/2016   RE: [name] [name] DOB: 1929-05-11 MRN: [id number]   Dear Dr. Martinique,    We have scheduled the above patient for an endoscopic procedure. Our records show that she is on anticoagulation therapy.   Please advise as to how long the patient may come off her therapy of Xarelto prior to the procedure, which is scheduled for 05/15/2016.  Please fax back/ or route the completed form to [place] at [phone].   Sincerely,    Phillis Haggis

## 2016-03-16 NOTE — Telephone Encounter (Signed)
She may stop Xarelto 2 days prior to procedure.  Jacquelyn Shadrick Martinique MD, [facility]

## 2016-03-19 ENCOUNTER — Telehealth: Payer: Self-pay

## 2016-03-19 NOTE — Telephone Encounter (Signed)
Called to tell patient she could hold her Xarelto for 2 days prior to her procedure - no answer, answering machine did not pick up.

## 2016-03-19 NOTE — Telephone Encounter (Signed)
Spoke with patient and told her that per Dr. Martinique she could hold her Xarelto for 2 days prior to her procedure.  Patient agreed.

## 2016-03-23 NOTE — Telephone Encounter (Signed)
Spoke with patient about holding Xarelto who agreed.

## 2016-03-26 ENCOUNTER — Encounter: Payer: Self-pay | Admitting: Internal Medicine

## 2016-03-26 ENCOUNTER — Ambulatory Visit (INDEPENDENT_AMBULATORY_CARE_PROVIDER_SITE_OTHER): Payer: Medicare Other | Admitting: Internal Medicine

## 2016-03-26 VITALS — BP 130/66 | HR 68 | Ht 65.0 in | Wt 127.0 lb

## 2016-03-26 DIAGNOSIS — Z950 Presence of cardiac pacemaker: Secondary | ICD-10-CM | POA: Diagnosis not present

## 2016-03-26 DIAGNOSIS — I350 Nonrheumatic aortic (valve) stenosis: Secondary | ICD-10-CM

## 2016-03-26 DIAGNOSIS — I495 Sick sinus syndrome: Secondary | ICD-10-CM | POA: Diagnosis not present

## 2016-03-26 DIAGNOSIS — I480 Paroxysmal atrial fibrillation: Secondary | ICD-10-CM | POA: Diagnosis not present

## 2016-03-26 LAB — CUP PACEART INCLINIC DEVICE CHECK
Battery Impedance: 498 Ohm
Brady Statistic AP VS Percent: 77 %
Brady Statistic AS VS Percent: 9 %
Date Time Interrogation Session: 20180122173400
Implantable Lead Implant Date: 19930514
Implantable Lead Implant Date: 19930514
Implantable Lead Location: 753859
Implantable Lead Model: 4261
Implantable Lead Serial Number: 201535
Lead Channel Impedance Value: 599 Ohm
Lead Channel Pacing Threshold Amplitude: 1.5 V
Lead Channel Pacing Threshold Pulse Width: 1 ms
Lead Channel Sensing Intrinsic Amplitude: 0.7 mV
Lead Channel Sensing Intrinsic Amplitude: 11.2 mV
Lead Channel Setting Pacing Amplitude: 2 V
Lead Channel Setting Pacing Pulse Width: 1 ms
Lead Channel Setting Sensing Sensitivity: 4 mV
MDC IDC LEAD LOCATION: 753860
MDC IDC LEAD MODEL: 4271
MDC IDC LEAD SERIAL: 3473
MDC IDC MSMT BATTERY REMAINING LONGEVITY: 84 mo
MDC IDC MSMT BATTERY VOLTAGE: 2.79 V
MDC IDC MSMT LEADCHNL RA IMPEDANCE VALUE: 550 Ohm
MDC IDC MSMT LEADCHNL RA PACING THRESHOLD AMPLITUDE: 1 V
MDC IDC MSMT LEADCHNL RA PACING THRESHOLD PULSEWIDTH: 0.4 ms
MDC IDC PG IMPLANT DT: 20100128
MDC IDC SET LEADCHNL RV PACING AMPLITUDE: 3 V
MDC IDC STAT BRADY AP VP PERCENT: 11 %
MDC IDC STAT BRADY AS VP PERCENT: 2 %

## 2016-03-26 NOTE — Progress Notes (Signed)
PCP: [name], MD Primary Cardiologist:  Dr Martinique  [name] [name] is a 83 y.o. female who presents today for routine electrophysiology followup. Afib is better controlled.  She has occasional episodes of fatigue but does not feel that these are due to afib.   Today, she denies symptoms of palpitations, chest pain, dizziness, presyncope, or syncope.  She has mild SOB which is stable.   The patient is otherwise without complaint today.   Past Medical History:  Diagnosis Date  . Aortic stenosis    mild  . Aortic stenosis   . Diverticulosis   . GERD (gastroesophageal reflux disease)   . Hemorrhoids   . Hyperlipidemia   . Hypertension   . Hypothyroidism   . Osteoarthritis   . Paroxysmal atrial fibrillation (HCC)   . Sick sinus syndrome (HCC)    s/p PPM (MDT)  . Syncope    Past Surgical History:  Procedure Laterality Date  . ABDOMINAL HYSTERECTOMY    . BASAL CELL CARCINOMA EXCISION    . INSERT / REPLACE / [place]   for syptomatic bradycardia and syncope -- in [facility]  . INSERT / REPLACE / REMOVE PACEMAKER  2001   pulse generator replacement by Dr. Rollene Fare   . INSERT / REPLACE / REMOVE PACEMAKER  04/01/2008   PPM Medtronic -- model # ADDRL1 serial # [id number] -- pulse generator replacement by Dr. Verlon Setting   . SQUAMOUS CELL CARCINOMA EXCISION      Current Outpatient Prescriptions  Medication Sig Dispense Refill  . amiodarone (PACERONE) 200 MG tablet Take 1 tablet (200 mg total) by mouth daily. 90 tablet 3  . calcium carbonate (OS-CAL) 600 MG TABS Take 600 mg by mouth daily.      Marland Kitchen CARTIA XT 240 MG 24 hr capsule TAKE 1 CAPSULE BY MOUTH EVERY DAY 90 capsule 2  . Cholecalciferol (VITAMIN D) 1000 UNITS capsule Take 1,000 Units by mouth daily.      . furosemide (LASIX) 20 MG tablet Take 1 tablet (20 mg total) by mouth daily as needed (weight gain). 30 tablet 6  . levothyroxine (SYNTHROID, LEVOTHROID) 100 MCG tablet Take 100 mcg by mouth daily.      .  metoprolol (LOPRESSOR) 50 MG tablet Take 1 tablet (50 mg total) by mouth 2 (two) times daily. 270 tablet 3  . Multiple Vitamins-Minerals (ICAPS AREDS 2) CAPS Take 1 capsule by mouth 2 (two) times daily.    Alveda Reasons 15 MG TABS tablet TAKE 1 TABLET BY MOUTH EVERY DAY 90 tablet 3   No current facility-administered medications for this visit.     Physical Exam: Vitals:   03/26/16 1429  BP: 130/66  BP Location: Left Arm  Patient Position: Sitting  Cuff Size: Normal  Pulse: 68  Weight: 127 lb (57.6 kg)  Height: 5\' 5"  (1.651 m)    GEN- The patient is well appearing, alert and oriented x 3 today.   Head- normocephalic, atraumatic Eyes-  Sclera clear, conjunctiva pink Ears- hearing intact Oropharynx- clear Lungs- Clear to ausculation bilaterally, normal work of breathing Chest- pacemaker pocket is well healed Heart- Regular rate and rhythm, 2/6 SEM LUSB (mid peaking), 3/6 holosystolic murmur at the apex which is quite prominent today GI- soft, NT, ND, + BS Extremities- no clubbing, cyanosis, or edema  Pacemaker interrogation- reviewed in detail today,  See PACEART report ekg today reveals atrial paced rhythm, QTc 427 msec   Assessment and Plan: BRADYCARDIA-TACHYCARDIA SYNDROME   Normal pacemaker function  Atrial noise is noted and reproducible with pocket manipulation.  Sensing, threshold, and impedance are otherwise stable.  No changes planned. See Claudia Desanctis Art report  RV threshold is chronically elevated though she does not RV pace  ATRIAL FIBRILLATION  Continue xarelto Doing well with amiodarone Consider reducing metoprolol in the future No changes today  AS/ MR Stable  Followed by Dr Martinique  Follow-up with Dr Martinique as scheduled Remote monitroing I will see again in 6 months unless problems arise.  Thompson Grayer MD, [facility] 03/26/2016 2:58 PM

## 2016-03-26 NOTE — Patient Instructions (Signed)
Medication Instructions:  Your physician recommends that you continue on your current medications as directed. Please refer to the Current Medication list given to you today.   Labwork: None ordered   Testing/Procedures: None ordered   Follow-Up: Remote monitoring is used to monitor your Pacemaker  from home. This monitoring reduces the number of office visits required to check your device to one time per year. It allows [place] to keep an eye on the functioning of your device to ensure it is working properly. You are scheduled for a device check from home on 06/25/16. You may send your transmission at any time that day. If you have a wireless device, the transmission will be sent automatically. After your physician reviews your transmission, you will receive a postcard with your next transmission date.  Your physician wants you to follow-up in: 6 months with Dr Vallery Ridge will receive a reminder letter in the mail two months in advance. If you don't receive a letter, please call our office to schedule the follow-up appointment.     Any Other Special Instructions Will Be Listed Below (If Applicable).     If you need a refill on your cardiac medications before your next appointment, please call your pharmacy.

## 2016-03-28 ENCOUNTER — Telehealth: Payer: Self-pay | Admitting: Internal Medicine

## 2016-03-28 NOTE — Telephone Encounter (Addendum)
Returned call to patient and when she was here the other day she meant to ask him if the Amiodarone could be causing her spells. Discussed with patient that not likely but if she does not get better to call us back

## 2016-03-28 NOTE — Telephone Encounter (Signed)
New Message:   Pt said she would like for you to call her when you have time.She have a question she needs you to ask Dr [name].

## 2016-05-01 ENCOUNTER — Telehealth: Payer: Self-pay | Admitting: Cardiology

## 2016-05-01 ENCOUNTER — Encounter: Payer: Self-pay | Admitting: Internal Medicine

## 2016-05-01 NOTE — Telephone Encounter (Signed)
Returned call to patient-patient states she is having an endoscopy and colonoscopy in 2 weeks and has some concerns.  States she has been taking amiodarone and it is "doing well" and she has not been in atrial fib in a long time.  Reports she is concerned having this procedure will cause her to go back into afib.  Patient also reports concerns that when she had this procedure "a long time ago" that she had complications and was a "code blue".  Patient states that the reason they are doing the procedure is due to anemia and bloody stool which have resolved.  Patient states she would like Dr Doug Sou "guidance" on this.    Advised I would route to MD for recommendations or concerns.

## 2016-05-01 NOTE — Telephone Encounter (Signed)
New Message   Per pt having a upcoming procedure, and wants to speak with nurse about it. Requesting call back

## 2016-05-02 NOTE — Telephone Encounter (Signed)
Returned call to patient [name]'s recommendations given.Patient was reassured.

## 2016-05-02 NOTE — Telephone Encounter (Signed)
I think the risk of Afib with endoscopy is low since she has been well controlled on amiodarone. The risk of serious bradycardia is mitigated by having a pacemaker. She is on Xarelto so we need to know the cause and risk of internal bleeding. While there is some risk to the procedure at her age I think it is reasonable to proceed.  Vicy Medico Martinique MD, [facility]

## 2016-05-15 ENCOUNTER — Ambulatory Visit (AMBULATORY_SURGERY_CENTER): Payer: Medicare Other | Admitting: Internal Medicine

## 2016-05-15 ENCOUNTER — Encounter: Payer: Self-pay | Admitting: Internal Medicine

## 2016-05-15 VITALS — BP 127/74 | HR 56 | Temp 96.2°F | Resp 12 | Ht 65.0 in | Wt 127.0 lb

## 2016-05-15 DIAGNOSIS — D508 Other iron deficiency anemias: Secondary | ICD-10-CM

## 2016-05-15 DIAGNOSIS — D124 Benign neoplasm of descending colon: Secondary | ICD-10-CM | POA: Diagnosis not present

## 2016-05-15 DIAGNOSIS — R195 Other fecal abnormalities: Secondary | ICD-10-CM

## 2016-05-15 MED ORDER — SODIUM CHLORIDE 0.9 % IV SOLN: 500.0000 mL | INTRAVENOUS | Status: DC

## 2016-05-15 NOTE — Progress Notes (Signed)
Pt's states no medical or surgical changes since previsit or office visit. 

## 2016-05-15 NOTE — Progress Notes (Signed)
Called to room to assist during endoscopic procedure.  Patient ID and intended procedure confirmed with present staff. Received instructions for my participation in the procedure from the performing physician.  

## 2016-05-15 NOTE — Patient Instructions (Signed)
YOU HAD AN ENDOSCOPIC PROCEDURE TODAY AT [facility]:   Refer to the procedure report that was given to you for any specific questions about what was found during the examination.  If the procedure report does not answer your questions, please call your gastroenterologist to clarify.  If you requested that your care partner not be given the details of your procedure findings, then the procedure report has been included in a sealed envelope for you to review at your convenience later.  YOU SHOULD EXPECT: Some feelings of bloating in the abdomen. Passage of more gas than usual.  Walking can help get rid of the air that was put into your GI tract during the procedure and reduce the bloating. If you had a lower endoscopy (such as a colonoscopy or flexible sigmoidoscopy) you may notice spotting of blood in your stool or on the toilet paper. If you underwent a bowel prep for your procedure, you may not have a normal bowel movement for a few days.  Please Note:  You might notice some irritation and congestion in your nose or some drainage.  This is from the oxygen used during your procedure.  There is no need for concern and it should clear up in a day or so.  SYMPTOMS TO REPORT IMMEDIATELY:   Following lower endoscopy (colonoscopy or flexible sigmoidoscopy):  Excessive amounts of blood in the stool  Significant tenderness or worsening of abdominal pains  Swelling of the abdomen that is new, acute  Fever of 100F or higher   Following upper endoscopy (EGD)  Vomiting of blood or coffee ground material  New chest pain or pain under the shoulder blades  Painful or persistently difficult swallowing  New shortness of breath  Fever of 100F or higher  Black, tarry-looking stools  For urgent or emergent issues, a gastroenterologist can be reached at any hour by calling [phone].   DIET:  We do recommend a small meal at first, but then you may proceed to your regular diet.  Drink  plenty of fluids but you should avoid alcoholic beverages for 24 hours.  MEDICATIONS:  Resume Xarelto (rivaroxaban) at prior dose today. Continue current medications.  ACTIVITY:  You should plan to take it easy for the rest of today and you should NOT DRIVE or use heavy machinery until tomorrow (because of the sedation medicines used during the test).    FOLLOW UP: Our staff will call the number listed on your records the next business day following your procedure to check on you and address any questions or concerns that you may have regarding the information given to you following your procedure. If we do not reach you, we will leave a message.  However, if you are feeling well and you are not experiencing any problems, there is no need to return our call.  We will assume that you have returned to your regular daily activities without incident.  If any biopsies were taken you will be contacted by phone or by letter within the next 1-3 weeks.  Please call us at [phone] if you have not heard about the biopsies in 3 weeks.   Return to the care of your primary provider. GI follow-up as needed.  Please see handouts given to you by your recovery nurse to include polyps, diverticulosis, and high-fiber diet.  Thank you for allowing [place] to provide for your healthcare needs today.  SIGNATURES/CONFIDENTIALITY: You and/or your care partner have signed paperwork which will be entered  into your electronic medical record.  These signatures attest to the fact that that the information above on your After Visit Summary has been reviewed and is understood.  Full responsibility of the confidentiality of this discharge information lies with you and/or your care-partner.

## 2016-05-15 NOTE — Op Note (Signed)
[place] Patient Name: [name] [name] Procedure Date: 05/15/2016 1:57 PM MRN: [id number] Endoscopist: Docia Chuck. Henrene Pastor , MD Age: 83 Referring MD:  Date of Birth: [birth year] Gender: Female Account #: [id number] Procedure:                Upper GI endoscopy Indications:              Iron deficiency anemia, Heme positive stool Medicines:                Monitored Anesthesia Care Procedure:                Pre-Anesthesia Assessment:                           - Prior to the procedure, a History and Physical                            was performed, and patient medications and                            allergies were reviewed. The patient's tolerance of                            previous anesthesia was also reviewed. The risks                            and benefits of the procedure and the sedation                            options and risks were discussed with the patient.                            All questions were answered, and informed consent                            was obtained. Prior Anticoagulants: The patient has                            taken Xarelto (rivaroxaban), last dose was 3 days                            prior to procedure. ASA Grade Assessment: III - A                            patient with severe systemic disease. After                            reviewing the risks and benefits, the patient was                            deemed in satisfactory condition to undergo the                            procedure.  After obtaining informed consent, the endoscope was                            passed under direct vision. Throughout the                            procedure, the patient's blood pressure, pulse, and                            oxygen saturations were monitored continuously. The                            Endoscope was introduced through the mouth, and                            advanced to the third part of duodenum. The  upper                            GI endoscopy was accomplished without difficulty.                            The patient tolerated the procedure well. Scope In: Scope Out: Findings:                 The esophagus was normal.                           The stomach was normal.                           The examined duodenum was normal.                           The cardia and gastric fundus were normal on                            retroflexion. Complications:            No immediate complications. Estimated Blood Loss:     Estimated blood loss: none. Impression:               - Normal esophagus.                           - Normal stomach.                           - Normal examined duodenum.                           - No specimens collected. Recommendation:           - Patient has a contact number available for                            emergencies. The signs and symptoms of potential  delayed complications were discussed with the                            patient. Return to normal activities tomorrow.                            Written discharge instructions were provided to the                            patient.                           - Resume previous diet.                           - Continue present medications.                           - Resume Xarelto (rivaroxaban) at prior dose today.                           - Return to the care of your primary provider. GI                            follow-up as needed Docia Chuck. Henrene Pastor, MD 05/15/2016 2:44:42 PM This report has been signed electronically.

## 2016-05-15 NOTE — Op Note (Signed)
[place] Patient Name: [name] [name] Procedure Date: 05/15/2016 1:57 PM MRN: [id number] Endoscopist: Docia Chuck. Henrene Pastor , MD Age: 83 Referring MD:  Date of Birth: 02-12-1930 Gender: Female Account #: [id number] Procedure:                Colonoscopy, with cold snare polypectomy X3 Indications:              Heme positive stool, Iron deficiency anemia. Remote                            colonoscopy 2002 negative for neoplasia Medicines:                Monitored Anesthesia Care Procedure:                Pre-Anesthesia Assessment:                           - Prior to the procedure, a History and Physical                            was performed, and patient medications and                            allergies were reviewed. The patient's tolerance of                            previous anesthesia was also reviewed. The risks                            and benefits of the procedure and the sedation                            options and risks were discussed with the patient.                            All questions were answered, and informed consent                            was obtained. Prior Anticoagulants: The patient has                            taken Xarelto (rivaroxaban), last dose was 3 days                            prior to procedure. ASA Grade Assessment: III - A                            patient with severe systemic disease. After                            reviewing the risks and benefits, the patient was                            deemed in satisfactory condition to undergo the  procedure.                           After obtaining informed consent, the colonoscope                            was passed under direct vision. Throughout the                            procedure, the patient's blood pressure, pulse, and                            oxygen saturations were monitored continuously. The                            Colonoscope was  introduced through the anus and                            advanced to the the cecum, identified by                            appendiceal orifice and ileocecal valve. The                            terminal ileum, ileocecal valve, appendiceal                            orifice, and rectum were photographed. The quality                            of the bowel preparation was excellent. The                            colonoscopy was performed without difficulty. The                            patient tolerated the procedure well. The bowel                            preparation used was SUPREP. Scope In: 2:14:01 PM Scope Out: 2:31:53 PM Scope Withdrawal Time: 0 hours 15 minutes 10 seconds  Total Procedure Duration: 0 hours 17 minutes 52 seconds  Findings:                 The terminal ileum appeared normal.                           Three polyps were found in the descending colon.                            The polyps were 1 to 2 mm in size. These polyps                            were removed with a cold snare. Resection and  retrieval were complete.                           Multiple diverticula were found in the sigmoid                            colon.                           The exam was otherwise without abnormality on                            direct and retroflexion views. Complications:            No immediate complications. Estimated blood loss:                            None. Estimated Blood Loss:     Estimated blood loss: none. Impression:               - The examined portion of the ileum was normal.                           - Three 1 to 2 mm polyps in the descending colon,                            removed with a cold snare. Resected and retrieved.                           - Diverticulosis in the sigmoid colon.                           - The examination was otherwise normal on direct                            and retroflexion  views. Recommendation:           - Repeat colonoscopy is not recommended for                            surveillance, given your age and favorable findings                            today.                           - Patient has a contact number available for                            emergencies. The signs and symptoms of potential                            delayed complications were discussed with the                            patient. Return to normal activities tomorrow.  Written discharge instructions were provided to the                            patient.                           - Resume previous diet.                           - Continue present medications.                           - Await pathology results.                           - EGD today. Please see report Docia Chuck. Henrene Pastor, MD 05/15/2016 2:41:55 PM This report has been signed electronically.

## 2016-05-15 NOTE — Progress Notes (Signed)
Spontaneous respirations throughout. VSS. Resting comfortably. To PACU on room air. Report to  Sara RN. 

## 2016-05-16 ENCOUNTER — Telehealth: Payer: Self-pay | Admitting: *Deleted

## 2016-05-16 NOTE — Telephone Encounter (Signed)
  Follow up Call-  Call back number 05/15/2016  Post procedure Call Back phone  # [phone]  Permission to leave phone message Yes  Some recent data might be hidden     Patient questions:  Do you have a fever, pain , or abdominal swelling? No. Pain Score  0 *  Have you tolerated food without any problems? Yes.    Have you been able to return to your normal activities? Yes.    Do you have any questions about your discharge instructions: Diet   No. Medications  No. Follow up visit  No.  Do you have questions or concerns about your Care? No.  Actions: * If pain score is 4 or above: No action needed, pain <4.

## 2016-05-18 ENCOUNTER — Encounter: Payer: Self-pay | Admitting: Internal Medicine

## 2016-05-25 ENCOUNTER — Ambulatory Visit: Payer: Medicare Other | Admitting: Cardiology

## 2016-05-31 DIAGNOSIS — Z7901 Long term (current) use of anticoagulants: Secondary | ICD-10-CM | POA: Insufficient documentation

## 2016-05-31 DIAGNOSIS — R040 Epistaxis: Secondary | ICD-10-CM | POA: Insufficient documentation

## 2016-06-03 NOTE — Progress Notes (Signed)
[name] [name] Date of Birth: 07/22/1929 Medical Record #[id number]  History of Present Illness: [name] is seen today for follow up Afib and AS.  She has a history of paroxysmal atrial fibrillation with tachy-brady syndrome and is s/p pacemaker implant. She is on chronic anticoagulation with Xarelto. Echo in July 2015 showed moderate aortic stenosis that had progressed since 2012. Repeat in July 2016 and July 2017 showed no change. In April 2017 she had atrial fibrillation that converted spontaneously. This was felt to be triggered by a steroid injection. Her metoprolol was increased at that time. When seen in June 2017 she was noted to be in persistent Afib. We placed her on Flecainide but this was later stopped due to side effects. She was seen in November with recurrent and persistent Afib. Rate was controlled but she was symptomatic. Seen in Afib clinic and started on amiodarone with subsequent conversion to NSR. Ecg in January showed NSR.   On follow up today she states she is not doing very well. She denies any palpitations and can usually tell if she is in Afib. She is tolerating amiodarone well. She does complain of worsening SOB with activity- this was present even before amiodarone and hasn't improved with restoration of NSR.  She states that any walking or using her arms makes her SOB. No edema.  No chest pain but she does describe a sinking feeling in her epigastrium that makes her feel very weak. She did have a upper and lower EGD which was negative except for few polyps. She had PFTs in December that showed mild obstructive defect with marked decrease in diffusion capacity.  Current Outpatient Prescriptions on File Prior to Visit  Medication Sig Dispense Refill  . amiodarone (PACERONE) 200 MG tablet Take 1 tablet (200 mg total) by mouth daily. 90 tablet 3  . calcium carbonate (OS-CAL) 600 MG TABS Take 600 mg by mouth daily.      Marland Kitchen CARTIA XT 240 MG 24 hr capsule TAKE 1 CAPSULE BY  MOUTH EVERY DAY 90 capsule 2  . Cholecalciferol (VITAMIN D) 1000 UNITS capsule Take 1,000 Units by mouth daily.      . furosemide (LASIX) 20 MG tablet Take 1 tablet (20 mg total) by mouth daily as needed (weight gain). 30 tablet 6  . levothyroxine (SYNTHROID, LEVOTHROID) 100 MCG tablet Take 100 mcg by mouth daily.      . metoprolol (LOPRESSOR) 50 MG tablet Take 1 tablet (50 mg total) by mouth 2 (two) times daily. 270 tablet 3  . Multiple Vitamins-Minerals (ICAPS AREDS 2) CAPS Take 1 capsule by mouth 2 (two) times daily.    Alveda Reasons 15 MG TABS tablet TAKE 1 TABLET BY MOUTH EVERY DAY 90 tablet 3   Current Facility-Administered Medications on File Prior to Visit  Medication Dose Route Frequency Provider Last Rate Last Dose  . 0.9 %  sodium chloride infusion  500 mL Intravenous Continuous Irene Shipper, MD        Allergies  Allergen Reactions  . Penicillins Anaphylaxis  . Tetanus Toxoid Anaphylaxis  . Demerol Other (See Comments)    Severe nausea  . Codeine Nausea Only    Severe nausea    Past Medical History:  Diagnosis Date  . Aortic stenosis    mild  . Aortic stenosis   . Diverticulosis   . GERD (gastroesophageal reflux disease)   . Hemorrhoids   . Hyperlipidemia   . Hypertension   . Hypothyroidism   . Osteoarthritis   .  Paroxysmal atrial fibrillation (HCC)   . Sick sinus syndrome (HCC)    s/p PPM (MDT)  . Syncope     Past Surgical History:  Procedure Laterality Date  . ABDOMINAL HYSTERECTOMY    . BASAL CELL CARCINOMA EXCISION    . INSERT / REPLACE / [place]   for syptomatic bradycardia and syncope -- in [facility]  . INSERT / REPLACE / REMOVE PACEMAKER  2001   pulse generator replacement by Dr. Rollene Fare   . INSERT / REPLACE / REMOVE PACEMAKER  04/01/2008   PPM Medtronic -- model # [id number] serial # [id number] -- pulse generator replacement by Dr. Verlon Setting   . SQUAMOUS CELL CARCINOMA EXCISION      History  Smoking Status  . Former Smoker  . Quit  date: 03/05/1978  Smokeless Tobacco  . Never Used    History  Alcohol Use No    Family History  Problem Relation Age of Onset  . Cancer Father     lung cancer  . Alzheimer's disease Mother     Review of Systems: As noted in history of present illness.  All other systems were reviewed and are negative.  Physical Exam: BP 121/74   Pulse 85   Ht 5\' 6"  (1.676 m)   Wt 127 lb 12.8 oz (58 kg)   BMI 20.63 kg/m  Patient is very pleasant and in no acute distress.  Skin is warm and dry. Color is normal.  HEENT is unremarkable. Normocephalic/atraumatic. PERRL. Sclera are nonicteric. Neck is supple. No masses. No JVD. Lungs are clear. Cardiac exam shows a regular rate and rhythm. Harsh Grade 2/6 systolic murmur RUSB>>apex. Abdomen is soft. Extremities are without edema. Gait and ROM are intact. No gross neurologic deficits noted.  Oxygen level 97% at rest. It does not drop with walking in office.   LABORATORY DATA:  Lab Results  Component Value Date   WBC 8.4 01/29/2016   HGB 13.9 01/29/2016   HCT 42.0 01/29/2016   PLT 216 01/29/2016   GLUCOSE 107 (H) 01/29/2016   NA 140 01/29/2016   K 4.0 01/29/2016   CL 103 01/29/2016   CREATININE 1.24 (H) 01/29/2016   BUN 25 (H) 01/29/2016   CO2 26 01/29/2016   TSH 0.90 06/13/2011   INR 2.0 07/20/2011   Labs dated 07/15/15: cholesterol 229, triglycerides 111, HDL 76, LDL 131.   Echo: 08/18/15 Study Conclusions  - Left ventricle: The cavity size was normal. Wall thickness was  normal. Systolic function was normal. The estimated ejection  fraction was in the range of 55% to 60%. Wall motion was normal;  there were no regional wall motion abnormalities. Features are  consistent with a pseudonormal left ventricular filling pattern,  with concomitant abnormal relaxation and increased filling  pressure (grade 2 diastolic dysfunction). - Aortic valve: There was moderate stenosis. There was mild to  moderate regurgitation directed  centrally in the LVOT. - Mitral valve: There was mild regurgitation directed centrally. - Tricuspid valve: There was mild-moderate regurgitation directed  centrally. - Pulmonary arteries: Systolic pressure was mildly increased. PA  peak pressure: 34 mm Hg (S).  Labs from primary care dated 07/14/15: Normal CMET and CBC. Cholesterol 229, trig 111, HDL 76, LDL 131. TSH 1.98.    Assessment / Plan: 1. Atrial fibrillation with sick sinus syndrome. Prior history of  persistent AFib. Intolerant of Flecainide. Now on amiodarone- converted to NSR.  Status post pacemaker implant. Rate is well controlled on metoprolol and diltiazem.  Continue long-term anticoagulation with Xarelto. Dose adjusted for age and renal function.   2. Aortic stenosis- moderate by Echo. Will update Echo now  3. HTN- controlled  4. DOE- etiology unclear. Did not improve with restoration of NSR. Symptoms preceded amiodarone. This could be related to progressive AS. Prior PFTs showed reduction in diffusion capacity but oxygen levels here with exertion look quite good. ? If she has underlying pulmonary disease or if dyspnea could be anginal equivalent. Will start with Echo. If AS has progressed I would recommend proceeding with [facility]. Other consideration would be high Res CT chest.

## 2016-06-04 ENCOUNTER — Encounter: Payer: Self-pay | Admitting: Cardiology

## 2016-06-04 ENCOUNTER — Ambulatory Visit (INDEPENDENT_AMBULATORY_CARE_PROVIDER_SITE_OTHER): Payer: Medicare Other | Admitting: Cardiology

## 2016-06-04 VITALS — BP 121/74 | HR 85 | Ht 66.0 in | Wt 127.8 lb

## 2016-06-04 DIAGNOSIS — I10 Essential (primary) hypertension: Secondary | ICD-10-CM

## 2016-06-04 DIAGNOSIS — R0609 Other forms of dyspnea: Secondary | ICD-10-CM | POA: Diagnosis not present

## 2016-06-04 DIAGNOSIS — Z950 Presence of cardiac pacemaker: Secondary | ICD-10-CM

## 2016-06-04 DIAGNOSIS — I350 Nonrheumatic aortic (valve) stenosis: Secondary | ICD-10-CM | POA: Diagnosis not present

## 2016-06-04 DIAGNOSIS — I480 Paroxysmal atrial fibrillation: Secondary | ICD-10-CM | POA: Diagnosis not present

## 2016-06-04 NOTE — Patient Instructions (Signed)
We will schedule you for an Echocardiogram   

## 2016-06-05 ENCOUNTER — Telehealth: Payer: Self-pay | Admitting: Internal Medicine

## 2016-06-05 NOTE — Telephone Encounter (Signed)
Pt states she is still having the abdominal pain on the right side of her stomach that she was having when she was seen. States it seems to hurt more after she eats. States she had ECL done and nothing was found to explain this discomfort. Pt wants to know if there is anything she can take for this discomfort or if Dr. Henrene Pastor has any other recommendations. Please advise.

## 2016-06-05 NOTE — Telephone Encounter (Signed)
I'm not sure why she is having right-sided pain. She had problems with right-sided pain years ago that  We evaluated without cause found. Set her up for CT scan of the abdomen and pelvis to evaluate right-sided pain. We can compare this to her CT in 2013

## 2016-06-06 ENCOUNTER — Other Ambulatory Visit: Payer: Self-pay

## 2016-06-06 DIAGNOSIS — R109 Unspecified abdominal pain: Secondary | ICD-10-CM

## 2016-06-06 NOTE — Telephone Encounter (Signed)
Pt scheduled for CT of A/P at [place] [facility] 06/19/16@11am , pt to arrive there at 10:45am. Pt to be NPO after 7am, drink bottle one of contrast at 9am, bottle 2 at 10am. Pt to come for labs prior to appt. Pt aware.

## 2016-06-07 ENCOUNTER — Other Ambulatory Visit (INDEPENDENT_AMBULATORY_CARE_PROVIDER_SITE_OTHER): Payer: Medicare Other

## 2016-06-07 DIAGNOSIS — R109 Unspecified abdominal pain: Secondary | ICD-10-CM | POA: Diagnosis not present

## 2016-06-07 LAB — CREATININE, SERUM: Creatinine, Ser: 1.37 mg/dL — ABNORMAL HIGH (ref 0.40–1.20)

## 2016-06-07 LAB — BUN: BUN: 26 mg/dL — AB (ref 6–23)

## 2016-06-19 ENCOUNTER — Ambulatory Visit (INDEPENDENT_AMBULATORY_CARE_PROVIDER_SITE_OTHER)
Admission: RE | Admit: 2016-06-19 | Discharge: 2016-06-19 | Disposition: A | Discharge status: Home / Self Care | Payer: Medicare Other | Source: Ambulatory Visit | Attending: Internal Medicine | Admitting: Internal Medicine

## 2016-06-19 DIAGNOSIS — R109 Unspecified abdominal pain: Secondary | ICD-10-CM | POA: Diagnosis not present

## 2016-06-19 MED ORDER — IOPAMIDOL (ISOVUE-300) INJECTION 61%
75.0000 mL | Freq: Once | INTRAVENOUS | Status: AC | PRN
  Administered 2016-06-19: 75 mL via INTRAVENOUS

## 2016-06-20 ENCOUNTER — Other Ambulatory Visit: Payer: Self-pay

## 2016-06-20 ENCOUNTER — Ambulatory Visit (HOSPITAL_COMMUNITY): Payer: Medicare Other | Attending: Cardiology

## 2016-06-20 DIAGNOSIS — I480 Paroxysmal atrial fibrillation: Secondary | ICD-10-CM | POA: Diagnosis not present

## 2016-06-20 DIAGNOSIS — E785 Hyperlipidemia, unspecified: Secondary | ICD-10-CM | POA: Diagnosis not present

## 2016-06-20 DIAGNOSIS — I083 Combined rheumatic disorders of mitral, aortic and tricuspid valves: Secondary | ICD-10-CM | POA: Diagnosis not present

## 2016-06-20 DIAGNOSIS — I350 Nonrheumatic aortic (valve) stenosis: Secondary | ICD-10-CM | POA: Diagnosis not present

## 2016-06-20 DIAGNOSIS — I10 Essential (primary) hypertension: Secondary | ICD-10-CM | POA: Diagnosis not present

## 2016-06-25 ENCOUNTER — Telehealth: Payer: Self-pay | Admitting: Cardiology

## 2016-06-25 ENCOUNTER — Ambulatory Visit (INDEPENDENT_AMBULATORY_CARE_PROVIDER_SITE_OTHER): Payer: Medicare Other | Admitting: *Deleted

## 2016-06-25 DIAGNOSIS — I495 Sick sinus syndrome: Secondary | ICD-10-CM

## 2016-06-25 NOTE — Telephone Encounter (Signed)
Pt called and stated that she has had several nose bleed. She had two nose bleeds on Thursday 06-21-16. He blood pressure was also up on this date. She had one more nose bleed another day but she could not remember when that one was. She has been to the ENT and they have told her that there is nothing else they can do, except surgery and she doesn't want a surgery. Can this be related to her medicines? Maybe her blood thinner and another heart medicines. Please call her back to discuss.

## 2016-06-25 NOTE — Progress Notes (Signed)
Remote pacemaker transmission.   

## 2016-06-26 NOTE — Telephone Encounter (Signed)
This all started on 06/15/16 and again 06/21/16.  She has been to an ENT and they could not find anything.  It has not bleed now for 3 days.  When it starts it can take as much as 4 hours to stop.  It is not dripping out but she can hold a tissue up to her nostril and there would be blood on the tissue.  She uses saline nasal spray daily.  Just wanting to know what else she can do.

## 2016-06-28 ENCOUNTER — Encounter: Payer: Self-pay | Admitting: Cardiology

## 2016-06-28 LAB — CUP PACEART REMOTE DEVICE CHECK
Battery Impedance: 597 Ohm
Brady Statistic AP VS Percent: 90 %
Brady Statistic AS VS Percent: 3 %
Date Time Interrogation Session: 20180423150517
Implantable Lead Implant Date: 19930514
Implantable Lead Location: 753860
Implantable Lead Model: 4261
Implantable Lead Serial Number: 3473
Lead Channel Impedance Value: 626 Ohm
Lead Channel Impedance Value: 704 Ohm
Lead Channel Pacing Threshold Amplitude: 1 V
Lead Channel Setting Pacing Amplitude: 2 V
MDC IDC LEAD IMPLANT DT: 19930514
MDC IDC LEAD LOCATION: 753859
MDC IDC LEAD MODEL: 4271
MDC IDC LEAD SERIAL: 201535
MDC IDC MSMT BATTERY REMAINING LONGEVITY: 84 mo
MDC IDC MSMT BATTERY VOLTAGE: 2.79 V
MDC IDC MSMT LEADCHNL RA PACING THRESHOLD PULSEWIDTH: 0.4 ms
MDC IDC PG IMPLANT DT: 20100128
MDC IDC SET LEADCHNL RV PACING AMPLITUDE: 3 V
MDC IDC SET LEADCHNL RV PACING PULSEWIDTH: 1 ms
MDC IDC SET LEADCHNL RV SENSING SENSITIVITY: 4 mV
MDC IDC STAT BRADY AP VP PERCENT: 6 %
MDC IDC STAT BRADY AS VP PERCENT: 1 %

## 2016-06-29 NOTE — Telephone Encounter (Signed)
Spoke with patient and she has not had any more nose bleeds.  She will call me back if this continues

## 2016-07-13 ENCOUNTER — Other Ambulatory Visit: Payer: Self-pay | Admitting: Nurse Practitioner

## 2016-07-16 ENCOUNTER — Other Ambulatory Visit (HOSPITAL_COMMUNITY): Payer: Self-pay | Admitting: *Deleted

## 2016-07-16 MED ORDER — AMIODARONE HCL 200 MG PO TABS: 200.0000 mg | ORAL_TABLET | Freq: Every day | ORAL | 3 refills | Status: DC

## 2016-08-01 ENCOUNTER — Other Ambulatory Visit (HOSPITAL_COMMUNITY): Payer: Self-pay | Admitting: *Deleted

## 2016-08-02 ENCOUNTER — Encounter (HOSPITAL_COMMUNITY): Payer: Medicare Other

## 2016-08-08 ENCOUNTER — Other Ambulatory Visit: Payer: Self-pay | Admitting: Cardiology

## 2016-08-10 ENCOUNTER — Encounter (HOSPITAL_COMMUNITY): Payer: Medicare Other

## 2016-08-20 ENCOUNTER — Ambulatory Visit (HOSPITAL_COMMUNITY)
Admission: RE | Admit: 2016-08-20 | Discharge: 2016-08-20 | Disposition: A | Discharge status: Home / Self Care | Payer: Medicare Other | Source: Ambulatory Visit | Attending: Internal Medicine | Admitting: Internal Medicine

## 2016-08-20 DIAGNOSIS — M810 Age-related osteoporosis without current pathological fracture: Secondary | ICD-10-CM | POA: Insufficient documentation

## 2016-08-20 MED ORDER — DENOSUMAB 60 MG/ML ~~LOC~~ SOLN
60.0000 mg | Freq: Once | SUBCUTANEOUS | Status: AC
  Administered 2016-08-20: 10:00:00 60 mg via SUBCUTANEOUS
  Filled 2016-08-20: qty 1

## 2016-09-15 NOTE — Progress Notes (Signed)
[name] [name] Date of Birth: 07-13-1929 Medical Record #[id number]  History of Present Illness: [name] [name] is seen today for follow up Afib and AS.  She has a history of paroxysmal atrial fibrillation with tachy-brady syndrome and is s/p pacemaker implant. She is on chronic anticoagulation with Xarelto. Echo in July 2015 showed moderate aortic stenosis that had progressed since 2012. Repeat in July 2016 and July 2017 showed no change. In April 2017 she had atrial fibrillation that converted spontaneously. This was felt to be triggered by a steroid injection. Her metoprolol was increased at that time. When seen in June 2017 she was noted to be in persistent Afib. We placed her on Flecainide but this was later stopped due to side effects. She was seen in November with recurrent and persistent Afib. Rate was controlled but she was symptomatic. Seen in Afib clinic and started on amiodarone with subsequent conversion to NSR. Ecg in January showed NSR.   On follow up today she states she is not doing very well. She denies any palpitations and can usually tell if she is in Afib. She is tolerating amiodarone well. She does complain of  SOB with activity- this was present even before amiodarone and hasn't improved with restoration of NSR.  She states that any walking or using her arms makes her SOB. No edema.  No chest pain but she does describe a sinking feeling or discomfort  in her epigastrium that makes her feel very weak. She did have a upper and lower EGD which was negative except for few polyps. She had PFTs in December that showed mild obstructive defect with marked decrease in diffusion capacity. CT of the Abdomen was negative for any pathology. She reports Dr. Virgina [name] did an [place] as well. She recently had a SSCA removed from her leg. Her last Echo was in April as noted below.  Current Outpatient Prescriptions on File Prior to Visit  Medication Sig Dispense Refill  . amiodarone (PACERONE) 200 MG tablet  Take 1 tablet (200 mg total) by mouth daily. 90 tablet 3  . calcium carbonate (OS-CAL) 600 MG TABS Take 600 mg by mouth daily.      . Cholecalciferol (VITAMIN D) 1000 UNITS capsule Take 1,000 Units by mouth daily.      . furosemide (LASIX) 20 MG tablet Take 1 tablet (20 mg total) by mouth daily as needed (weight gain). 30 tablet 6  . levothyroxine (SYNTHROID, LEVOTHROID) 100 MCG tablet Take 100 mcg by mouth daily.      . metoprolol (LOPRESSOR) 50 MG tablet TAKE 1 TABLET(50 MG) BY MOUTH TWICE DAILY 180 tablet 1  . Multiple Vitamins-Minerals (ICAPS AREDS 2) CAPS Take 1 capsule by mouth 2 (two) times daily.    [name] [name] 15 MG TABS tablet TAKE 1 TABLET BY MOUTH EVERY DAY 90 tablet 3   Current Facility-Administered Medications on File Prior to Visit  Medication Dose Route Frequency Provider Last Rate Last Dose  . 0.9 %  sodium chloride infusion  500 mL Intravenous Continuous [name] Shipper, MD        Allergies  Allergen Reactions  . Penicillins Anaphylaxis  . Tetanus Toxoid Anaphylaxis  . Demerol Other (See Comments)    Severe nausea  . Codeine Nausea Only    Severe nausea    Past Medical History:  Diagnosis Date  . Aortic stenosis    mild  . Aortic stenosis   . Diverticulosis   . GERD (gastroesophageal reflux disease)   . Hemorrhoids   .  Hyperlipidemia   . Hypertension   . Hypothyroidism   . Osteoarthritis   . Paroxysmal atrial fibrillation (HCC)   . Sick sinus syndrome (HCC)    s/p PPM (MDT)  . Syncope     Past Surgical History:  Procedure Laterality Date  . ABDOMINAL HYSTERECTOMY    . BASAL CELL CARCINOMA EXCISION    . INSERT / REPLACE / [place]   for syptomatic bradycardia and syncope -- in [facility]  . INSERT / REPLACE / REMOVE PACEMAKER  2001   pulse generator replacement by Dr. Rollene [name]   . INSERT / REPLACE / REMOVE PACEMAKER  04/01/2008   PPM Medtronic -- model # [id number] serial # [id number] -- pulse generator replacement by Dr. Verlon [name]   .  SQUAMOUS CELL CARCINOMA EXCISION      History  Smoking Status  . Former Smoker  . Quit date: 03/05/1978  Smokeless Tobacco  . Never Used    History  Alcohol Use No    Family History  Problem Relation Age of Onset  . Cancer Father        lung cancer  . Alzheimer's disease Mother     Review of Systems: As noted in history of present illness.  All other systems were reviewed and are negative.  Physical Exam: BP 102/70 (BP Location: Left Arm, Patient Position: Sitting, Cuff Size: Normal)   Pulse 82   Ht 5\' 6"  (1.676 m)   Wt 126 lb (57.2 kg)   BMI 20.34 kg/m  Patient is very pleasant and in no acute distress.  Skin is warm and dry. Color is normal.  HEENT is unremarkable. Normocephalic/atraumatic. PERRL. Sclera are nonicteric. Neck is supple. No masses. No JVD. Lungs are clear. Cardiac exam shows a regular rate and rhythm. Harsh Grade 2/6 systolic murmur RUSB>>apex. Abdomen is soft. Extremities are without edema. Gait and ROM are intact. No gross neurologic deficits noted.  LABORATORY DATA:  Lab Results  Component Value Date   WBC 8.4 01/29/2016   HGB 13.9 01/29/2016   HCT 42.0 01/29/2016   PLT 216 01/29/2016   GLUCOSE 107 (H) 01/29/2016   NA 140 01/29/2016   K 4.0 01/29/2016   CL 103 01/29/2016   CREATININE 1.37 (H) 06/07/2016   BUN 26 (H) 06/07/2016   CO2 26 01/29/2016   TSH 0.90 06/13/2011   INR 2.0 07/20/2011   Labs dated 07/15/15: cholesterol 229, triglycerides 111, HDL 76, LDL 131.  Dated 07/19/16: cholesterol 247, triglycerides 71, HDL 88, LDL 145. BUN 28, creatinine 1.3. Other chemistries, CBC and TSH normal.  Echo: 08/18/15 Study Conclusions  - Left ventricle: The cavity size was normal. Wall thickness was  normal. Systolic function was normal. The estimated ejection  fraction was in the range of 55% to 60%. Wall motion was normal;  there were no regional wall motion abnormalities. Features are  consistent with a pseudonormal left ventricular filling  pattern,  with concomitant abnormal relaxation and increased filling  pressure (grade 2 diastolic dysfunction). - Aortic valve: There was moderate stenosis. There was mild to  moderate regurgitation directed centrally in the LVOT. - Mitral valve: There was mild regurgitation directed centrally. - Tricuspid valve: There was mild-moderate regurgitation directed  centrally. - Pulmonary arteries: Systolic pressure was mildly increased. PA  peak pressure: 34 mm Hg (S).  Echo 06/20/16: Study Conclusions  - Left ventricle: The cavity size was normal. There was mild   concentric hypertrophy. Systolic function was normal. The   estimated ejection fraction  was in the range of 55% to 60%. Wall   motion was normal; there were no regional wall motion   abnormalities. - Aortic valve: Valve mobility was restricted. There was moderate   stenosis. There was moderate regurgitation. Peak velocity (S):   370 cm/s. Mean gradient (S): 30 mm Hg. Regurgitation pressure   half-time: 449 ms. - Mitral valve: Transvalvular velocity was within the normal range.   There was no evidence for stenosis. There was mild regurgitation. - Left atrium: The atrium was moderately dilated. - Right ventricle: The cavity size was normal. Wall thickness was   normal. Systolic function was normal. - Tricuspid valve: There was moderate regurgitation. - Pulmonary arteries: Systolic pressure was within the normal   range. PA peak pressure: 36 mm Hg (S).  Impressions:  - No significant changes since 08/2015.  Labs from primary care dated 07/14/15: Normal CMET and CBC. Cholesterol 229, trig 111, HDL 76, LDL 131. TSH 1.98.    Assessment / Plan: 1. Atrial fibrillation with sick sinus syndrome. Prior history of  persistent AFib. Intolerant of Flecainide. Now on amiodarone since Novemver- converted to NSR and no recurrence.  Status post pacemaker implant.  Continue long-term anticoagulation with Xarelto. Dose adjusted for age  and renal function.   2. Aortic stenosis- moderate by Echo. Echo  in April 2018 showed slight increase in gradient but no significant change.   3. HTN- controlled  4. DOE/ epigastric pain- etiology unclear. Did not improve with restoration of NSR. Symptoms preceded amiodarone. AS still in moderate range.  Prior PFTs showed reduction in diffusion capacity but oxygen levels here with exertion look quite good. My other concern is whether this could be related to ischemia. It is possible her medication may be contributing. I have recommended stopping Cartia XT at this time. Since amiodarone is controlling her Afib she does not need this for rate control. We will also schedule her for a Lexiscan myoview study. May eventually need [facility].

## 2016-09-20 ENCOUNTER — Encounter: Payer: Self-pay | Admitting: Cardiology

## 2016-09-20 ENCOUNTER — Ambulatory Visit (INDEPENDENT_AMBULATORY_CARE_PROVIDER_SITE_OTHER): Payer: Medicare Other | Admitting: Cardiology

## 2016-09-20 VITALS — BP 102/70 | HR 82 | Ht 66.0 in | Wt 126.0 lb

## 2016-09-20 DIAGNOSIS — R0609 Other forms of dyspnea: Secondary | ICD-10-CM

## 2016-09-20 DIAGNOSIS — I480 Paroxysmal atrial fibrillation: Secondary | ICD-10-CM | POA: Diagnosis not present

## 2016-09-20 DIAGNOSIS — Z950 Presence of cardiac pacemaker: Secondary | ICD-10-CM

## 2016-09-20 DIAGNOSIS — R1013 Epigastric pain: Secondary | ICD-10-CM

## 2016-09-20 DIAGNOSIS — I350 Nonrheumatic aortic (valve) stenosis: Secondary | ICD-10-CM | POA: Diagnosis not present

## 2016-09-20 NOTE — Patient Instructions (Signed)
Stop taking Cartia XT  We will schedule you for a Nuclear stress test

## 2016-09-24 ENCOUNTER — Ambulatory Visit (INDEPENDENT_AMBULATORY_CARE_PROVIDER_SITE_OTHER): Payer: Medicare Other | Admitting: *Deleted

## 2016-09-24 ENCOUNTER — Telehealth: Payer: Self-pay | Admitting: Internal Medicine

## 2016-09-24 DIAGNOSIS — I495 Sick sinus syndrome: Secondary | ICD-10-CM

## 2016-09-24 NOTE — Telephone Encounter (Signed)
New message    Pt is calling asking for help with her transmission today.

## 2016-09-24 NOTE — Telephone Encounter (Signed)
Walked patient through how to send a manual transmission. Patient verbalized understanding and will call with any additional questions.

## 2016-09-25 NOTE — Progress Notes (Signed)
Remote pacemaker transmission.   

## 2016-09-27 ENCOUNTER — Encounter: Payer: Self-pay | Admitting: Cardiology

## 2016-09-28 ENCOUNTER — Telehealth (HOSPITAL_COMMUNITY): Payer: Self-pay

## 2016-09-28 ENCOUNTER — Inpatient Hospital Stay (HOSPITAL_COMMUNITY): Admission: RE | Admit: 2016-09-28 | Payer: Medicare Other | Source: Ambulatory Visit

## 2016-09-28 NOTE — Telephone Encounter (Signed)
Encounter complete. 

## 2016-10-01 ENCOUNTER — Encounter: Payer: Self-pay | Admitting: Internal Medicine

## 2016-10-01 ENCOUNTER — Ambulatory Visit (INDEPENDENT_AMBULATORY_CARE_PROVIDER_SITE_OTHER): Payer: Medicare Other | Admitting: Internal Medicine

## 2016-10-01 ENCOUNTER — Encounter (INDEPENDENT_AMBULATORY_CARE_PROVIDER_SITE_OTHER): Payer: Self-pay

## 2016-10-01 VITALS — BP 158/88 | HR 83 | Ht 66.0 in | Wt 128.0 lb

## 2016-10-01 DIAGNOSIS — Z950 Presence of cardiac pacemaker: Secondary | ICD-10-CM | POA: Diagnosis not present

## 2016-10-01 DIAGNOSIS — I495 Sick sinus syndrome: Secondary | ICD-10-CM | POA: Diagnosis not present

## 2016-10-01 DIAGNOSIS — I480 Paroxysmal atrial fibrillation: Secondary | ICD-10-CM

## 2016-10-01 LAB — CUP PACEART INCLINIC DEVICE CHECK
Battery Impedance: 649 Ohm
Battery Voltage: 2.78 V
Brady Statistic AP VP Percent: 8 %
Brady Statistic AP VS Percent: 88 %
Brady Statistic AS VS Percent: 2 %
Implantable Lead Implant Date: 19930514
Implantable Lead Location: 753859
Implantable Lead Location: 753860
Implantable Lead Model: 4261
Implantable Lead Model: 4271
Implantable Pulse Generator Implant Date: 20100128
Lead Channel Impedance Value: 649 Ohm
Lead Channel Impedance Value: 69 Ohm
Lead Channel Pacing Threshold Amplitude: 1.75 V
Lead Channel Pacing Threshold Pulse Width: 1 ms
Lead Channel Sensing Intrinsic Amplitude: 0.7 mV
Lead Channel Sensing Intrinsic Amplitude: 11.2 mV
Lead Channel Setting Pacing Amplitude: 2.25 V
Lead Channel Setting Pacing Pulse Width: 1 ms
MDC IDC LEAD IMPLANT DT: 19930514
MDC IDC LEAD SERIAL: 201535
MDC IDC LEAD SERIAL: 3473
MDC IDC MSMT BATTERY REMAINING LONGEVITY: 48 mo
MDC IDC MSMT LEADCHNL RA PACING THRESHOLD PULSEWIDTH: 0.4 ms
MDC IDC MSMT LEADCHNL RV PACING THRESHOLD AMPLITUDE: 1.25 V
MDC IDC SESS DTM: 20180730140213
MDC IDC SET LEADCHNL RV PACING AMPLITUDE: 3 V
MDC IDC SET LEADCHNL RV SENSING SENSITIVITY: 4 mV
MDC IDC STAT BRADY AS VP PERCENT: 1 %

## 2016-10-01 MED ORDER — AMIODARONE HCL 200 MG PO TABS: ORAL_TABLET | ORAL | 3 refills | Status: DC

## 2016-10-01 MED ORDER — LISINOPRIL 5 MG PO TABS: 5.0000 mg | ORAL_TABLET | Freq: Every day | ORAL | 11 refills | Status: DC

## 2016-10-01 NOTE — Progress Notes (Signed)
PCP: [name], MD Primary Cardiologist:  Dr Martinique  [name] [name] is a 83 y.o. female who presents today for routine electrophysiology followup.  Since last being seen in our clinic, the patient reports doing reasonably well.  She has had some SOB for which she is followed by Dr Martinique.  Today, she denies symptoms of palpitations, chest pain,  lower extremity edema, dizziness, presyncope, or syncope.  The patient is otherwise without complaint today.   Past Medical History:  Diagnosis Date  . Aortic stenosis    mild  . Aortic stenosis   . Diverticulosis   . GERD (gastroesophageal reflux disease)   . Hemorrhoids   . Hyperlipidemia   . Hypertension   . Hypothyroidism   . Osteoarthritis   . Paroxysmal atrial fibrillation (HCC)   . Sick sinus syndrome (HCC)    s/p PPM (MDT)  . Syncope    Past Surgical History:  Procedure Laterality Date  . ABDOMINAL HYSTERECTOMY    . BASAL CELL CARCINOMA EXCISION    . INSERT / REPLACE / [place]   for syptomatic bradycardia and syncope -- in [facility]  . INSERT / REPLACE / REMOVE PACEMAKER  2001   pulse generator replacement by Dr. Rollene Fare   . INSERT / REPLACE / REMOVE PACEMAKER  04/01/2008   PPM Medtronic -- model # [id number] serial # [id number] -- pulse generator replacement by Dr. Verlon Setting   . SQUAMOUS CELL CARCINOMA EXCISION      ROS- all systems are reviewed and negative except as per HPI above  Current Outpatient Prescriptions  Medication Sig Dispense Refill  . amiodarone (PACERONE) 200 MG tablet Take 1 tablet (200 mg total) by mouth daily. 90 tablet 3  . calcium carbonate (OS-CAL) 600 MG TABS Take 600 mg by mouth daily.      . Cholecalciferol (VITAMIN D) 1000 UNITS capsule Take 1,000 Units by mouth daily.      Marland Kitchen dicyclomine (BENTYL) 10 MG capsule Take 20 mg by mouth as needed (As directed).   1  . esomeprazole (NEXIUM) 40 MG capsule Take 40 mg by mouth daily as needed (as directed).     . furosemide (LASIX) 20 MG  tablet Take 1 tablet (20 mg total) by mouth daily as needed (weight gain). 30 tablet 6  . levothyroxine (SYNTHROID, LEVOTHROID) 100 MCG tablet Take 100 mcg by mouth daily.      . Multiple Vitamins-Minerals (ICAPS AREDS 2) CAPS Take 1 capsule by mouth 2 (two) times daily.    Alveda Reasons 15 MG TABS tablet TAKE 1 TABLET BY MOUTH EVERY DAY 90 tablet 3   Current Facility-Administered Medications  Medication Dose Route Frequency Provider Last Rate Last Dose  . 0.9 %  sodium chloride infusion  500 mL Intravenous Continuous Irene Shipper, MD        Physical Exam: Vitals:   10/01/16 0954  BP: (!) 158/88  Pulse: 83  SpO2: 97%  Weight: 128 lb (58.1 kg)  Height: 5\' 6"  (1.676 m)    GEN- The patient is well appearing, alert and oriented x 3 today.   Head- normocephalic, atraumatic Eyes-  Sclera clear, conjunctiva pink Ears- hearing intact Oropharynx- clear Lungs- Clear to ausculation bilaterally, normal work of breathing Chest- pacemaker pocket is well healed Heart- Regular rate and rhythm, no murmurs, rubs or gallops, PMI not laterally displaced GI- soft, NT, ND, + BS Extremities- no clubbing, cyanosis, or edema  Pacemaker interrogation- reviewed in detail today,  See PACEART report  Assessment and Plan:  1. Symptomatic sinus bradycardia Normal pacemaker function though atrial lead has some signs of failure (low impedance, increasing threshold, and low p waves).  Stable unipolar.  100% a paced.  RV lead is stable, she does not RV pace.  She would like to avoid lead revision at this time.  Given her advanced age I think that this is reasonable.  Will reassess if lead fails. See Pace Art report Atrial output increased to 3V today.  2. afib Stable On xarelto Reduce amiodarone to 100mg  daily  3. AS/MR Stable No change required today Followed by Dr Martinique  4. HTN Elevated Will add ace inhibitor Needs BMET with PCP in 6 weeks   Follow-up with Dr Martinique as scheduled Return to see  me in 6 months [facility]   Thompson Grayer MD, [facility] 10/01/2016 10:41 AM

## 2016-10-01 NOTE — Patient Instructions (Signed)
Medication Instructions:  Your physician has recommended you make the following change in your medication:  1) Start Lisinopril 5 mg daily 2) Decrease Amiodarone to 100 mg daily(take 1/2 of a 200mg  tablet)   Labwork: Your physician recommends that you return for lab work in: 6 weeks with your PCP.  Will need a BMP with starting the new medication    Testing/Procedures: None ordered   Follow-Up: Remote monitoring is used to monitor your Pacemaker  from home. This monitoring reduces the number of office visits required to check your device to one time per year. It allows [place] to keep an eye on the functioning of your device to ensure it is working properly. You are scheduled for a device check from home on 12/24/16. You may send your transmission at any time that day. If you have a wireless device, the transmission will be sent automatically. After your physician reviews your transmission, you will receive a postcard with your next transmission date.  Your physician wants you to follow-up in: 6 months with Dr Vallery Ridge will receive a reminder letter in the mail two months in advance. If you don't receive a letter, please call our office to schedule the follow-up appointment.       Any Other Special Instructions Will Be Listed Below (If Applicable).     If you need a refill on your cardiac medications before your next appointment, please call your pharmacy.

## 2016-10-03 ENCOUNTER — Ambulatory Visit (HOSPITAL_COMMUNITY)
Admission: RE | Admit: 2016-10-03 | Discharge: 2016-10-03 | Disposition: A | Discharge status: Home / Self Care | Payer: Medicare Other | Source: Ambulatory Visit | Attending: Cardiovascular Disease | Admitting: Cardiovascular Disease

## 2016-10-03 DIAGNOSIS — I350 Nonrheumatic aortic (valve) stenosis: Secondary | ICD-10-CM | POA: Diagnosis not present

## 2016-10-03 DIAGNOSIS — I2510 Atherosclerotic heart disease of native coronary artery without angina pectoris: Secondary | ICD-10-CM | POA: Diagnosis not present

## 2016-10-03 DIAGNOSIS — R1013 Epigastric pain: Secondary | ICD-10-CM | POA: Diagnosis not present

## 2016-10-03 DIAGNOSIS — Z87891 Personal history of nicotine dependence: Secondary | ICD-10-CM | POA: Diagnosis not present

## 2016-10-03 DIAGNOSIS — I480 Paroxysmal atrial fibrillation: Secondary | ICD-10-CM | POA: Diagnosis not present

## 2016-10-03 DIAGNOSIS — Z8249 Family history of ischemic heart disease and other diseases of the circulatory system: Secondary | ICD-10-CM | POA: Diagnosis not present

## 2016-10-03 DIAGNOSIS — Z950 Presence of cardiac pacemaker: Secondary | ICD-10-CM | POA: Diagnosis not present

## 2016-10-03 DIAGNOSIS — I10 Essential (primary) hypertension: Secondary | ICD-10-CM | POA: Diagnosis not present

## 2016-10-03 DIAGNOSIS — R0609 Other forms of dyspnea: Secondary | ICD-10-CM | POA: Insufficient documentation

## 2016-10-03 DIAGNOSIS — E039 Hypothyroidism, unspecified: Secondary | ICD-10-CM | POA: Diagnosis not present

## 2016-10-03 DIAGNOSIS — R42 Dizziness and giddiness: Secondary | ICD-10-CM | POA: Diagnosis not present

## 2016-10-03 LAB — MYOCARDIAL PERFUSION IMAGING
CHL CUP NUCLEAR SRS: 1
CHL CUP NUCLEAR SSS: 3
LV dias vol: 73 mL (ref 46–106)
LV sys vol: 30 mL
Peak HR: 71 {beats}/min
Rest HR: 57 {beats}/min
SDS: 2
TID: 0.87

## 2016-10-03 MED ORDER — TECHNETIUM TC 99M TETROFOSMIN IV KIT
10.1000 | PACK | Freq: Once | INTRAVENOUS | Status: AC | PRN
  Administered 2016-10-03: 10.1 via INTRAVENOUS
  Filled 2016-10-03: qty 11

## 2016-10-03 MED ORDER — TECHNETIUM TC 99M TETROFOSMIN IV KIT
30.9000 | PACK | Freq: Once | INTRAVENOUS | Status: AC | PRN
  Administered 2016-10-03: 30.9 via INTRAVENOUS
  Filled 2016-10-03: qty 31

## 2016-10-03 MED ORDER — REGADENOSON 0.4 MG/5ML IV SOLN
0.4000 mg | Freq: Once | INTRAVENOUS | Status: AC
  Administered 2016-10-03: 0.4 mg via INTRAVENOUS

## 2016-10-03 MED ORDER — AMINOPHYLLINE 25 MG/ML IV SOLN
75.0000 mg | Freq: Once | INTRAVENOUS | Status: AC
  Administered 2016-10-03: 75 mg via INTRAVENOUS

## 2016-10-04 ENCOUNTER — Telehealth: Payer: Self-pay | Admitting: Internal Medicine

## 2016-10-04 LAB — CUP PACEART REMOTE DEVICE CHECK
Battery Impedance: 623 Ohm
Battery Voltage: 2.79 V
Brady Statistic AP VS Percent: 89 %
Brady Statistic AS VP Percent: 1 %
Date Time Interrogation Session: 20180723143400
Implantable Lead Implant Date: 19930514
Implantable Lead Implant Date: 19930514
Implantable Lead Location: 753860
Implantable Lead Model: 4261
Implantable Lead Serial Number: 201535
Implantable Lead Serial Number: 3473
Implantable Pulse Generator Implant Date: 20100128
Lead Channel Impedance Value: 638 Ohm
Lead Channel Impedance Value: 756 Ohm
Lead Channel Setting Pacing Amplitude: 2.25 V
Lead Channel Setting Pacing Amplitude: 3 V
Lead Channel Setting Sensing Sensitivity: 4 mV
MDC IDC LEAD LOCATION: 753859
MDC IDC LEAD MODEL: 4271
MDC IDC MSMT BATTERY REMAINING LONGEVITY: 81 mo
MDC IDC MSMT LEADCHNL RA PACING THRESHOLD AMPLITUDE: 1.125 V
MDC IDC MSMT LEADCHNL RA PACING THRESHOLD PULSEWIDTH: 0.4 ms
MDC IDC SET LEADCHNL RV PACING PULSEWIDTH: 1 ms
MDC IDC STAT BRADY AP VP PERCENT: 8 %
MDC IDC STAT BRADY AS VS PERCENT: 2 %

## 2016-10-04 NOTE — Telephone Encounter (Signed)
I suspect that her lead may have failed further. Function was worse bipolar. May be best to go ahead and revise her lead next week.   I could revise her lead on Tuesday.  Offer follow-up appointment with Chanetta Marshall for 10/05/16.  Thompson Grayer MD, [facility] 10/04/2016 11:28 PM

## 2016-10-04 NOTE — Telephone Encounter (Signed)
New message      Pt states she was seen on Monday the 30th by Dr [name] and pacemaker clinic.  Patient now states that she can "feel her heartbeat at her pacemaker area"  Please advise

## 2016-10-04 NOTE — Telephone Encounter (Signed)
Spoke with patient and explained that some of the changes made to her device on 7/30 could possibly contribute to what she was feeling. I explained that it was not dangerous and that I had reached out to [facility] for recommendations. She verbalized understanding.

## 2016-10-05 ENCOUNTER — Encounter: Payer: Self-pay | Admitting: Nurse Practitioner

## 2016-10-05 ENCOUNTER — Ambulatory Visit (INDEPENDENT_AMBULATORY_CARE_PROVIDER_SITE_OTHER): Payer: Medicare Other | Admitting: Nurse Practitioner

## 2016-10-05 VITALS — BP 142/84 | HR 90 | Ht 66.0 in | Wt 128.0 lb

## 2016-10-05 DIAGNOSIS — I4819 Other persistent atrial fibrillation: Secondary | ICD-10-CM

## 2016-10-05 DIAGNOSIS — I10 Essential (primary) hypertension: Secondary | ICD-10-CM

## 2016-10-05 DIAGNOSIS — R001 Bradycardia, unspecified: Secondary | ICD-10-CM | POA: Diagnosis not present

## 2016-10-05 DIAGNOSIS — I481 Persistent atrial fibrillation: Secondary | ICD-10-CM | POA: Diagnosis not present

## 2016-10-05 DIAGNOSIS — I350 Nonrheumatic aortic (valve) stenosis: Secondary | ICD-10-CM | POA: Diagnosis not present

## 2016-10-05 LAB — CUP PACEART INCLINIC DEVICE CHECK
Date Time Interrogation Session: 20180803131247
Implantable Lead Implant Date: 19930514
Implantable Lead Location: 753859
Implantable Lead Serial Number: 3473
Implantable Pulse Generator Implant Date: 20100128
MDC IDC LEAD IMPLANT DT: 19930514
MDC IDC LEAD LOCATION: 753860
MDC IDC LEAD MODEL: 4261
MDC IDC LEAD MODEL: 4271
MDC IDC LEAD SERIAL: 201535

## 2016-10-05 NOTE — Progress Notes (Signed)
Electrophysiology Office Note Date: 10/05/2016  ID:  [name] [name], [name] [name] Dec 21, 1929, MRN [id number]  PCP: [name], MD Primary Cardiologist: Martinique Electrophysiologist: Allred  CC: Pacemaker follow-up  [name] [name] is a 83 y.o. female seen today for Dr [name].  She presents today for add on electrophysiology followup.  At recent office visit with Dr [name], her atrial lead was noted to have signs of failure with low impedance and increasing threshold. Her output was reprogrammed and since that time, she has had pectoral stimulation as well as intermittent dizziness not associated with position that last seconds. She denies chest pain, palpitations, dyspnea, PND, orthopnea, nausea, vomiting, syncope, edema, weight gain, or early satiety.  Device History: MDT dual chamber PPM implanted 1993 for SSS; gen change 2010   Past Medical History:  Diagnosis Date  . Aortic stenosis    mild  . Aortic stenosis   . Diverticulosis   . GERD (gastroesophageal reflux disease)   . Hemorrhoids   . Hyperlipidemia   . Hypertension   . Hypothyroidism   . Osteoarthritis   . Paroxysmal atrial fibrillation (HCC)   . Sick sinus syndrome (HCC)    s/p PPM (MDT)  . Syncope    Past Surgical History:  Procedure Laterality Date  . ABDOMINAL HYSTERECTOMY    . BASAL CELL CARCINOMA EXCISION    . INSERT / REPLACE / [place]   for syptomatic bradycardia and syncope -- in [facility]  . INSERT / REPLACE / REMOVE PACEMAKER  2001   pulse generator replacement by Dr. Rollene Fare   . INSERT / REPLACE / REMOVE PACEMAKER  04/01/2008   PPM Medtronic -- model # [id number] serial # [id number] -- pulse generator replacement by Dr. Verlon Setting   . SQUAMOUS CELL CARCINOMA EXCISION      Current Outpatient Prescriptions  Medication Sig Dispense Refill  . amiodarone (PACERONE) 200 MG tablet Take 1/2 tablet (100 mg) by mouth daily 90 tablet 3  . calcium carbonate (OS-CAL) 600 MG TABS Take 600 mg by mouth  daily.      . Cholecalciferol (VITAMIN D) 1000 UNITS capsule Take 1,000 Units by mouth daily.      Marland Kitchen dicyclomine (BENTYL) 10 MG capsule Take 20 mg by mouth as needed (As directed).   1  . esomeprazole (NEXIUM) 40 MG capsule Take 40 mg by mouth daily as needed (as directed).     . furosemide (LASIX) 20 MG tablet Take 1 tablet (20 mg total) by mouth daily as needed (weight gain). 30 tablet 6  . levothyroxine (SYNTHROID, LEVOTHROID) 100 MCG tablet Take 100 mcg by mouth daily.      Marland Kitchen lisinopril (PRINIVIL,ZESTRIL) 5 MG tablet Take 1 tablet (5 mg total) by mouth daily. 60 tablet 11  . Multiple Vitamins-Minerals (ICAPS AREDS 2) CAPS Take 1 capsule by mouth 2 (two) times daily.    Alveda Reasons 15 MG TABS tablet TAKE 1 TABLET BY MOUTH EVERY DAY 90 tablet 3   Current Facility-Administered Medications  Medication Dose Route Frequency Provider Last Rate Last Dose  . 0.9 %  sodium chloride infusion  500 mL Intravenous Continuous Irene Shipper, MD        Allergies:   Penicillins; Tetanus toxoid; Demerol; and Codeine   Social History: Social History   Social History  . Marital status: Widowed    Spouse name: N/A  . Number of children: 0  . Years of education: N/A   Occupational History  .  Retired  Social History Main Topics  . Smoking status: Former Smoker    Quit date: 03/05/1978  . Smokeless tobacco: Never Used  . Alcohol use No  . Drug use: No  . Sexual activity: Not on file   Other Topics Concern  . Not on file   Social History Narrative  . No narrative on file    Family History: Family History  Problem Relation Age of Onset  . Cancer Father        lung cancer  . Alzheimer's disease Mother      Review of Systems: All other systems reviewed and are otherwise negative except as noted above.   Physical Exam: VS:  BP (!) 142/84   Pulse 90   Ht 5\' 6"  (1.676 m)   Wt 128 lb (58.1 kg)   BMI 20.66 kg/m  , BMI Body mass index is 20.66 kg/m.  GEN- The patient is elderly and  frail appearing, alert and oriented x 3 today.   HEENT: normocephalic, atraumatic; sclera clear, conjunctiva pink; hearing intact; oropharynx clear; neck supple  Lungs- Clear to ausculation bilaterally, normal work of breathing.  No wheezes, rales, rhonchi Heart- Regular rate and rhythm GI- soft, non-tender, non-distended, bowel sounds present  Extremities- no clubbing, cyanosis, or edema  MS- no significant deformity or atrophy Skin- warm and dry, no rash or lesion; right sided PPM pocket well healed Psych- euthymic mood, full affect Neuro- strength and sensation are intact  PPM Interrogation- reviewed in detail today,  See PACEART report  EKG:  EKG is not ordered today.  Recent Labs: 01/29/2016: Hemoglobin 13.9; Platelets 216; Potassium 4.0; Sodium 140 06/07/2016: BUN 26; Creatinine, Ser 1.37   Wt Readings from Last 3 Encounters:  10/05/16 128 lb (58.1 kg)  10/03/16 126 lb (57.2 kg)  10/01/16 128 lb (58.1 kg)     Other studies Reviewed: Additional studies/ records that were reviewed today include: Dr Jackalyn Lombard office notes  Assessment and Plan:  1.  Symptomatic bradycardia See Pace Art report RA threshold increasing and impedence dropping. She is atrially dependent today by device interrogation. Junctional escape in the 40's With worsening RA lead function and pectoral stim, I think lead revision is reasonable at this time. I have been unable to program around today.  Risks, benefits reviewed with patient who wishes to proceed. Will plan for Tuesday with Dr [name] ER precautions reviewed  2.  Paroxysmal atrial fibrillation Burden by device interrogation 5.7% Continue Xarelto for CHADS2VASC of 4 Continue low dose amiodarone  3.  AS/MR Followed by Dr Martinique  4.  HTN Stable No change required today Will follow BMET next week with recent addition of ACE-I  Current medicines are reviewed at length with the patient today.   The patient does not have concerns regarding her  medicines.  The following changes were made today:  none  Labs/ tests ordered today include: pre-procedure labs  Orders Placed This Encounter  Procedures  . Basic Metabolic Panel (BMET)  . CBC  . INR/PT  . CUP PACEART INCLINIC DEVICE CHECK     Disposition:   Follow up with Dr [name] after lead revision     Signed, Chanetta Marshall, NP 10/05/2016 1:15 PM  [address] [place] [address] [phone] (office) [phone] (fax

## 2016-10-05 NOTE — Patient Instructions (Addendum)
Medication Instructions:  Your physician recommends that you continue on your current medications as directed. Please refer to the Current Medication list given to you today.   Labwork: Your physician recommends that you return for lab work today for CBC, BMET, PT/INR   Testing/Procedures:  Please be at the hospital on 10/09/16 at 9:00 for your procedure.   Follow-Up:   Any Other Special Instructions Will Be Listed Below (If Applicable).     If you need a refill on your cardiac medications before your next appointment, please call your pharmacy.

## 2016-10-06 LAB — CBC
HEMATOCRIT: 33.2 % — AB (ref 34.0–46.6)
Hemoglobin: 10.7 g/dL — ABNORMAL LOW (ref 11.1–15.9)
MCH: 29.6 pg (ref 26.6–33.0)
MCHC: 32.2 g/dL (ref 31.5–35.7)
MCV: 92 fL (ref 79–97)
Platelets: 310 10*3/uL (ref 150–379)
RBC: 3.62 x10E6/uL — AB (ref 3.77–5.28)
RDW: 16.6 % — ABNORMAL HIGH (ref 12.3–15.4)
WBC: 5.6 10*3/uL (ref 3.4–10.8)

## 2016-10-06 LAB — BASIC METABOLIC PANEL
BUN/Creatinine Ratio: 18 (ref 12–28)
BUN: 25 mg/dL (ref 8–27)
CO2: 24 mmol/L (ref 20–29)
CREATININE: 1.41 mg/dL — AB (ref 0.57–1.00)
Calcium: 9.4 mg/dL (ref 8.7–10.3)
Chloride: 98 mmol/L (ref 96–106)
GFR calc Af Amer: 39 mL/min/{1.73_m2} — ABNORMAL LOW (ref >59)
GFR, EST NON AFRICAN AMERICAN: 34 mL/min/{1.73_m2} — AB (ref >59)
GLUCOSE: 96 mg/dL (ref 65–99)
Potassium: 5.1 mmol/L (ref 3.5–5.2)
SODIUM: 139 mmol/L (ref 134–144)

## 2016-10-06 LAB — PROTIME-INR
INR: 1.1 (ref 0.8–1.2)
Prothrombin Time: 11.3 s (ref 9.1–12.0)

## 2016-10-09 ENCOUNTER — Ambulatory Visit (HOSPITAL_COMMUNITY)
Admission: RE | Admit: 2016-10-09 | Discharge: 2016-10-10 | Disposition: A | Discharge status: Home / Self Care | Payer: Medicare Other | Source: Ambulatory Visit | Attending: Internal Medicine | Admitting: Internal Medicine

## 2016-10-09 ENCOUNTER — Ambulatory Visit (HOSPITAL_COMMUNITY)
Admission: RE | Disposition: A | Discharge status: Home / Self Care | Payer: Self-pay | Source: Ambulatory Visit | Attending: Internal Medicine

## 2016-10-09 DIAGNOSIS — Z7901 Long term (current) use of anticoagulants: Secondary | ICD-10-CM | POA: Diagnosis not present

## 2016-10-09 DIAGNOSIS — Z959 Presence of cardiac and vascular implant and graft, unspecified: Secondary | ICD-10-CM

## 2016-10-09 DIAGNOSIS — E785 Hyperlipidemia, unspecified: Secondary | ICD-10-CM | POA: Diagnosis not present

## 2016-10-09 DIAGNOSIS — I480 Paroxysmal atrial fibrillation: Secondary | ICD-10-CM | POA: Insufficient documentation

## 2016-10-09 DIAGNOSIS — I10 Essential (primary) hypertension: Secondary | ICD-10-CM | POA: Diagnosis not present

## 2016-10-09 DIAGNOSIS — Z87891 Personal history of nicotine dependence: Secondary | ICD-10-CM | POA: Insufficient documentation

## 2016-10-09 DIAGNOSIS — Z880 Allergy status to penicillin: Secondary | ICD-10-CM | POA: Diagnosis not present

## 2016-10-09 DIAGNOSIS — M1990 Unspecified osteoarthritis, unspecified site: Secondary | ICD-10-CM | POA: Insufficient documentation

## 2016-10-09 DIAGNOSIS — I350 Nonrheumatic aortic (valve) stenosis: Secondary | ICD-10-CM | POA: Insufficient documentation

## 2016-10-09 DIAGNOSIS — Y713 Surgical instruments, materials and cardiovascular devices (including sutures) associated with adverse incidents: Secondary | ICD-10-CM | POA: Diagnosis not present

## 2016-10-09 DIAGNOSIS — E039 Hypothyroidism, unspecified: Secondary | ICD-10-CM | POA: Diagnosis not present

## 2016-10-09 DIAGNOSIS — I495 Sick sinus syndrome: Secondary | ICD-10-CM

## 2016-10-09 DIAGNOSIS — T82110A Breakdown (mechanical) of cardiac electrode, initial encounter: Secondary | ICD-10-CM | POA: Diagnosis not present

## 2016-10-09 DIAGNOSIS — K219 Gastro-esophageal reflux disease without esophagitis: Secondary | ICD-10-CM | POA: Insufficient documentation

## 2016-10-09 HISTORY — PX: LEAD REVISION/REPAIR: EP1213

## 2016-10-09 HISTORY — DX: Family history of other specified conditions: Z84.89

## 2016-10-09 HISTORY — DX: Other specified postprocedural states: R11.2

## 2016-10-09 HISTORY — DX: Other specified postprocedural states: Z98.890

## 2016-10-09 LAB — SURGICAL PCR SCREEN
MRSA, PCR: NEGATIVE
Staphylococcus aureus: POSITIVE — AB

## 2016-10-09 SURGERY — LEAD REVISION/REPAIR

## 2016-10-09 MED ORDER — LIDOCAINE HCL (PF) 1 % IJ SOLN
INTRAMUSCULAR | Status: AC
  Filled 2016-10-09: qty 30

## 2016-10-09 MED ORDER — MIDAZOLAM HCL 5 MG/5ML IJ SOLN
INTRAMUSCULAR | Status: DC | PRN
  Administered 2016-10-09 (×2): 1 mg via INTRAVENOUS

## 2016-10-09 MED ORDER — SODIUM CHLORIDE 0.9% FLUSH: 3.0000 mL | INTRAVENOUS | Status: DC | PRN

## 2016-10-09 MED ORDER — VANCOMYCIN HCL IN DEXTROSE 1-5 GM/200ML-% IV SOLN
INTRAVENOUS | Status: AC
  Filled 2016-10-09: qty 200

## 2016-10-09 MED ORDER — LEVOTHYROXINE SODIUM 100 MCG PO TABS
100.0000 ug | ORAL_TABLET | Freq: Every day | ORAL | Status: DC
  Administered 2016-10-10: 100 ug via ORAL
  Filled 2016-10-09: qty 1

## 2016-10-09 MED ORDER — ACETAMINOPHEN 325 MG PO TABS
325.0000 mg | ORAL_TABLET | ORAL | Status: DC | PRN
  Administered 2016-10-09: 650 mg via ORAL

## 2016-10-09 MED ORDER — IOPAMIDOL (ISOVUE-370) INJECTION 76%
INTRAVENOUS | Status: AC
  Filled 2016-10-09: qty 50

## 2016-10-09 MED ORDER — GENTAMICIN SULFATE 40 MG/ML IJ SOLN
80.0000 mg | INTRAMUSCULAR | Status: AC
  Administered 2016-10-09: 80 mg

## 2016-10-09 MED ORDER — SODIUM CHLORIDE 0.9 % IV SOLN
INTRAVENOUS | Status: DC
  Administered 2016-10-09: 11:00:00 via INTRAVENOUS

## 2016-10-09 MED ORDER — MUPIROCIN 2 % EX OINT
1.0000 "application " | TOPICAL_OINTMENT | Freq: Once | CUTANEOUS | Status: AC
  Administered 2016-10-09 (×2): 1 via TOPICAL

## 2016-10-09 MED ORDER — SODIUM CHLORIDE 0.9 % IR SOLN
Status: DC | PRN
  Administered 2016-10-09: 13:00:00

## 2016-10-09 MED ORDER — VANCOMYCIN HCL IN DEXTROSE 1-5 GM/200ML-% IV SOLN
1000.0000 mg | Freq: Two times a day (BID) | INTRAVENOUS | Status: AC
  Administered 2016-10-09: 1000 mg via INTRAVENOUS
  Filled 2016-10-09 (×2): qty 200

## 2016-10-09 MED ORDER — SODIUM CHLORIDE 0.9 % IR SOLN
Status: AC
  Filled 2016-10-09: qty 2

## 2016-10-09 MED ORDER — ONDANSETRON HCL 4 MG/2ML IJ SOLN
4.0000 mg | Freq: Four times a day (QID) | INTRAMUSCULAR | Status: DC | PRN
  Administered 2016-10-10: 4 mg via INTRAVENOUS
  Filled 2016-10-09: qty 2

## 2016-10-09 MED ORDER — SODIUM CHLORIDE 0.9% FLUSH
3.0000 mL | Freq: Two times a day (BID) | INTRAVENOUS | Status: DC
  Administered 2016-10-09 – 2016-10-10 (×2): 3 mL via INTRAVENOUS

## 2016-10-09 MED ORDER — LISINOPRIL 5 MG PO TABS
5.0000 mg | ORAL_TABLET | Freq: Every day | ORAL | Status: DC
  Administered 2016-10-09 – 2016-10-10 (×2): 5 mg via ORAL
  Filled 2016-10-09 (×2): qty 1

## 2016-10-09 MED ORDER — IOPAMIDOL (ISOVUE-370) INJECTION 76%
INTRAVENOUS | Status: DC | PRN
  Administered 2016-10-09: 15 mL via INTRAVENOUS

## 2016-10-09 MED ORDER — LIDOCAINE HCL (PF) 1 % IJ SOLN
INTRAMUSCULAR | Status: DC | PRN
  Administered 2016-10-09: 25 mL via INTRADERMAL
  Administered 2016-10-09: 70 mL via INTRADERMAL

## 2016-10-09 MED ORDER — ACETAMINOPHEN 325 MG PO TABS
ORAL_TABLET | ORAL | Status: AC
  Filled 2016-10-09: qty 2

## 2016-10-09 MED ORDER — SODIUM CHLORIDE 0.9 % IV SOLN: 250.0000 mL | INTRAVENOUS | Status: DC | PRN

## 2016-10-09 MED ORDER — MUPIROCIN 2 % EX OINT
TOPICAL_OINTMENT | CUTANEOUS | Status: AC
  Administered 2016-10-09: 1 via TOPICAL
  Filled 2016-10-09: qty 22

## 2016-10-09 MED ORDER — HEPARIN (PORCINE) IN NACL 2-0.9 UNIT/ML-% IJ SOLN
INTRAMUSCULAR | Status: AC
  Filled 2016-10-09: qty 500

## 2016-10-09 MED ORDER — FENTANYL CITRATE (PF) 100 MCG/2ML IJ SOLN
INTRAMUSCULAR | Status: AC
  Filled 2016-10-09: qty 2

## 2016-10-09 MED ORDER — ACETAMINOPHEN 325 MG PO TABS
650.0000 mg | ORAL_TABLET | Freq: Four times a day (QID) | ORAL | Status: DC | PRN
  Administered 2016-10-10: 650 mg via ORAL
  Filled 2016-10-09: qty 2

## 2016-10-09 MED ORDER — VANCOMYCIN HCL IN DEXTROSE 1-5 GM/200ML-% IV SOLN
1000.0000 mg | INTRAVENOUS | Status: AC
  Administered 2016-10-09: 1000 mg via INTRAVENOUS

## 2016-10-09 MED ORDER — HEPARIN (PORCINE) IN NACL 2-0.9 UNIT/ML-% IJ SOLN
INTRAMUSCULAR | Status: AC | PRN
  Administered 2016-10-09: 500 mL

## 2016-10-09 MED ORDER — CHLORHEXIDINE GLUCONATE 4 % EX LIQD: 60.0000 mL | Freq: Once | CUTANEOUS | Status: DC

## 2016-10-09 MED ORDER — MIDAZOLAM HCL 5 MG/5ML IJ SOLN
INTRAMUSCULAR | Status: AC
  Filled 2016-10-09: qty 5

## 2016-10-09 MED ORDER — FENTANYL CITRATE (PF) 100 MCG/2ML IJ SOLN
INTRAMUSCULAR | Status: DC | PRN
  Administered 2016-10-09 (×2): 12.5 ug via INTRAVENOUS

## 2016-10-09 MED ORDER — AMIODARONE HCL 100 MG PO TABS
100.0000 mg | ORAL_TABLET | Freq: Every day | ORAL | Status: DC
  Administered 2016-10-10: 100 mg via ORAL
  Filled 2016-10-09: qty 1

## 2016-10-09 SURGICAL SUPPLY — 9 items
CABLE SURGICAL S-101-97-12 (CABLE) ×2 IMPLANT
LEAD CAPSURE NOVUS 45CM (Lead) ×2 IMPLANT
LEAD CAPSURE NOVUS 5076-58CM (Lead) ×2 IMPLANT
PACEMAKER ADAPTA DR ADDRL1 (Pacemaker) IMPLANT
PAD DEFIB LIFELINK (PAD) ×2 IMPLANT
PPM ADAPTA DR ADDRL1 (Pacemaker) ×3 IMPLANT
SHEATH CLASSIC 7F (SHEATH) ×6 IMPLANT
SHEATH PINNACLE 6F 10CM (SHEATH) ×2 IMPLANT
TRAY PACEMAKER INSERTION (PACKS) ×4 IMPLANT

## 2016-10-09 NOTE — Interval H&P Note (Signed)
History and Physical Interval Note:  10/09/2016 10:21 AM  [name] [name]  has presented today for surgery, with the diagnosis of atrial lead failure  The various methods of treatment have been discussed with the patient and family. After consideration of risks, benefits and other options for treatment, the patient has consented to pacemaker system revision with anticipation of new pacing system to be placed on the left side and the right sided system to be abandoned.   The patient's history has been reviewed, patient examined, no change in status, stable for surgery.  I have reviewed the patient's chart and labs.  Questions were answered to the patient's satisfaction.    Risks, benefits, alternatives to pacemaker system revision with new L sided system placement were discussed in detail with the patient today. The patient understands that the risks include but are not limited to bleeding, infection, pneumothorax, perforation, tamponade, vascular damage, renal failure, MI, stroke, death,  and lead dislodgement and wishes to proceed.      Thompson Grayer

## 2016-10-09 NOTE — Plan of Care (Signed)
Problem: Safety: Goal: Ability to remain free from injury will improve Outcome: Progressing Fall risk bundle in place per protocol. Will continue to implement safety measures.   Problem: Pain Managment: Goal: General experience of comfort will improve Outcome: Progressing Pt having 6/10 pain at operative site. No orders for pain medication. Cardiology paged. Waiting for orders.

## 2016-10-09 NOTE — H&P (View-Only) (Signed)
Electrophysiology Office Note Date: 10/05/2016  ID:  [name], [name] October 07, 1929, MRN [id number]  PCP: [name], MD Primary Cardiologist: [place] Electrophysiologist: Allred  CC: Pacemaker follow-up  [name] [name] is a 83 y.o. female seen today for Dr [name].  She presents today for add on electrophysiology followup.  At recent office visit with Dr [name], her atrial lead was noted to have signs of failure with low impedance and increasing threshold. Her output was reprogrammed and since that time, she has had pectoral stimulation as well as intermittent dizziness not associated with position that last seconds. She denies chest pain, palpitations, dyspnea, PND, orthopnea, nausea, vomiting, syncope, edema, weight gain, or early satiety.  Device History: MDT dual chamber PPM implanted 1993 for SSS; gen change 2010   Past Medical History:  Diagnosis Date  . Aortic stenosis    mild  . Aortic stenosis   . Diverticulosis   . GERD (gastroesophageal reflux disease)   . Hemorrhoids   . Hyperlipidemia   . Hypertension   . Hypothyroidism   . Osteoarthritis   . Paroxysmal atrial fibrillation (HCC)   . Sick sinus syndrome (HCC)    s/p PPM (MDT)  . Syncope    Past Surgical History:  Procedure Laterality Date  . ABDOMINAL HYSTERECTOMY    . BASAL CELL CARCINOMA EXCISION    . INSERT / REPLACE / [place]   for syptomatic bradycardia and syncope -- in [facility]  . INSERT / REPLACE / REMOVE PACEMAKER  2001   pulse generator replacement by Dr. Rollene Fare   . INSERT / REPLACE / REMOVE PACEMAKER  04/01/2008   PPM Medtronic -- model # ADDRL1 serial # [id number] -- pulse generator replacement by Dr. Verlon Setting   . SQUAMOUS CELL CARCINOMA EXCISION      Current Outpatient Prescriptions  Medication Sig Dispense Refill  . amiodarone (PACERONE) 200 MG tablet Take 1/2 tablet (100 mg) by mouth daily 90 tablet 3  . calcium carbonate (OS-CAL) 600 MG TABS Take 600 mg by mouth  daily.      . Cholecalciferol (VITAMIN D) 1000 UNITS capsule Take 1,000 Units by mouth daily.      Marland Kitchen dicyclomine (BENTYL) 10 MG capsule Take 20 mg by mouth as needed (As directed).   1  . esomeprazole (NEXIUM) 40 MG capsule Take 40 mg by mouth daily as needed (as directed).     . furosemide (LASIX) 20 MG tablet Take 1 tablet (20 mg total) by mouth daily as needed (weight gain). 30 tablet 6  . levothyroxine (SYNTHROID, LEVOTHROID) 100 MCG tablet Take 100 mcg by mouth daily.      Marland Kitchen lisinopril (PRINIVIL,ZESTRIL) 5 MG tablet Take 1 tablet (5 mg total) by mouth daily. 60 tablet 11  . Multiple Vitamins-Minerals (ICAPS AREDS 2) CAPS Take 1 capsule by mouth 2 (two) times daily.    Alveda Reasons 15 MG TABS tablet TAKE 1 TABLET BY MOUTH EVERY DAY 90 tablet 3   Current Facility-Administered Medications  Medication Dose Route Frequency Provider Last Rate Last Dose  . 0.9 %  sodium chloride infusion  500 mL Intravenous Continuous Irene Shipper, MD        Allergies:   Penicillins; Tetanus toxoid; Demerol; and Codeine   Social History: Social History   Social History  . Marital status: Widowed    Spouse name: N/A  . Number of children: 0  . Years of education: N/A   Occupational History  .  Retired  Social History Main Topics  . Smoking status: Former Smoker    Quit date: 03/05/1978  . Smokeless tobacco: Never Used  . Alcohol use No  . Drug use: No  . Sexual activity: Not on file   Other Topics Concern  . Not on file   Social History Narrative  . No narrative on file    Family History: Family History  Problem Relation Age of Onset  . Cancer Father        lung cancer  . Alzheimer's disease Mother      Review of Systems: All other systems reviewed and are otherwise negative except as noted above.   Physical Exam: VS:  BP (!) 142/84   Pulse 90   Ht 5\' 6"  (1.676 m)   Wt 128 lb (58.1 kg)   BMI 20.66 kg/m  , BMI Body mass index is 20.66 kg/m.  GEN- The patient is elderly and  frail appearing, alert and oriented x 3 today.   HEENT: normocephalic, atraumatic; sclera clear, conjunctiva pink; hearing intact; oropharynx clear; neck supple  Lungs- Clear to ausculation bilaterally, normal work of breathing.  No wheezes, rales, rhonchi Heart- Regular rate and rhythm GI- soft, non-tender, non-distended, bowel sounds present  Extremities- no clubbing, cyanosis, or edema  MS- no significant deformity or atrophy Skin- warm and dry, no rash or lesion; right sided PPM pocket well healed Psych- euthymic mood, full affect Neuro- strength and sensation are intact  PPM Interrogation- reviewed in detail today,  See PACEART report  EKG:  EKG is not ordered today.  Recent Labs: 01/29/2016: Hemoglobin 13.9; Platelets 216; Potassium 4.0; Sodium 140 06/07/2016: BUN 26; Creatinine, Ser 1.37   Wt Readings from Last 3 Encounters:  10/05/16 128 lb (58.1 kg)  10/03/16 126 lb (57.2 kg)  10/01/16 128 lb (58.1 kg)     Other studies Reviewed: Additional studies/ records that were reviewed today include: Dr Jackalyn Lombard office notes  Assessment and Plan:  1.  Symptomatic bradycardia See Pace Art report RA threshold increasing and impedence dropping. She is atrially dependent today by device interrogation. Junctional escape in the 40's With worsening RA lead function and pectoral stim, I think lead revision is reasonable at this time. I have been unable to program around today.  Risks, benefits reviewed with patient who wishes to proceed. Will plan for Tuesday with Dr [name] ER precautions reviewed  2.  Paroxysmal atrial fibrillation Burden by device interrogation 5.7% Continue Xarelto for CHADS2VASC of 4 Continue low dose amiodarone  3.  AS/MR Followed by Dr Martinique  4.  HTN Stable No change required today Will follow BMET next week with recent addition of ACE-I  Current medicines are reviewed at length with the patient today.   The patient does not have concerns regarding her  medicines.  The following changes were made today:  none  Labs/ tests ordered today include: pre-procedure labs  Orders Placed This Encounter  Procedures  . Basic Metabolic Panel (BMET)  . CBC  . INR/PT  . CUP PACEART INCLINIC DEVICE CHECK     Disposition:   Follow up with Dr [name] after lead revision     Signed, Chanetta Marshall, NP 10/05/2016 1:15 PM  [place] [address] [phone] (office) [phone] (fax

## 2016-10-10 ENCOUNTER — Ambulatory Visit (HOSPITAL_COMMUNITY): Payer: Medicare Other

## 2016-10-10 ENCOUNTER — Encounter (HOSPITAL_COMMUNITY): Payer: Self-pay | Admitting: Internal Medicine

## 2016-10-10 DIAGNOSIS — R55 Syncope and collapse: Secondary | ICD-10-CM

## 2016-10-10 DIAGNOSIS — T82110A Breakdown (mechanical) of cardiac electrode, initial encounter: Secondary | ICD-10-CM | POA: Diagnosis not present

## 2016-10-10 DIAGNOSIS — I480 Paroxysmal atrial fibrillation: Secondary | ICD-10-CM | POA: Diagnosis not present

## 2016-10-10 DIAGNOSIS — I10 Essential (primary) hypertension: Secondary | ICD-10-CM | POA: Diagnosis not present

## 2016-10-10 DIAGNOSIS — I495 Sick sinus syndrome: Secondary | ICD-10-CM

## 2016-10-10 DIAGNOSIS — I951 Orthostatic hypotension: Secondary | ICD-10-CM

## 2016-10-10 LAB — BASIC METABOLIC PANEL
ANION GAP: 8 (ref 5–15)
BUN: 22 mg/dL — ABNORMAL HIGH (ref 6–20)
CALCIUM: 8.6 mg/dL — AB (ref 8.9–10.3)
CHLORIDE: 101 mmol/L (ref 101–111)
CO2: 25 mmol/L (ref 22–32)
Creatinine, Ser: 1.26 mg/dL — ABNORMAL HIGH (ref 0.44–1.00)
GFR calc non Af Amer: 38 mL/min — ABNORMAL LOW (ref >60)
GFR, EST AFRICAN AMERICAN: 44 mL/min — AB (ref >60)
Glucose, Bld: 103 mg/dL — ABNORMAL HIGH (ref 65–99)
POTASSIUM: 4.1 mmol/L (ref 3.5–5.1)
Sodium: 134 mmol/L — ABNORMAL LOW (ref 135–145)

## 2016-10-10 MED FILL — Gentamicin Sulfate Inj 40 MG/ML: INTRAMUSCULAR | Qty: 2 | Status: AC

## 2016-10-10 MED FILL — Sodium Chloride Irrigation Soln 0.9%: Qty: 500 | Status: AC

## 2016-10-10 NOTE — Discharge Summary (Signed)
ELECTROPHYSIOLOGY PROCEDURE DISCHARGE SUMMARY    Patient ID: VERLYN DANNENBERG,  MRN: [id number], DOB/AGE: [birth year] 83 y.o.  Admit date: 10/09/2016 Discharge date: 10/10/2016  Primary Care Physician: [name], MD  Primary Cardiologist: Dr. Martinique Electrophysiologist: Dr. [name]  Primary Discharge Diagnosis:  1. PPM lead failure     S/p new PPM implant this admission  Secondary Discharge Diagnosis:  1. Paroxysmal AFib     CHA2DS2Vasc I 4, on Xarelto 2. HTN   Allergies  Allergen Reactions  . Penicillins Anaphylaxis  . Tetanus Toxoid Anaphylaxis  . Demerol Other (See Comments)    Severe nausea  . Codeine Nausea Only    Severe nausea     Procedures This Admission:  1.  Removal of PPM and capping leads Right side, new Implantation of a MDT dual chamber PPM on the left side, 10/09/16 by Dr [name].  The patient received a Medtronic Adapta L model ADDRL 1 (serial number [id number]) pacemaker, Medtronic model [id number] (serial number [id number]) right atrial lead and a Medtronic model 5076- 58 (serial number [id number]) right ventricular lead  There were no immediate post procedure complications. 2.  CXR on 10/10/16 demonstrated no pneumothorax status post device implantation.   Brief HPI: [name] [name] is a 83 y.o. female was referred to electrophysiology in the outpatient setting with RA lead failure noted, being atrially dependent felt was needed for device revision.  Past medical history includes symptomatic bradycardia, PAFib, VHD, HTN.  Risks, benefits, and alternatives to PPM implantation were reviewed with the patient who wished to proceed.   Hospital Course:  The patient was admitted and underwent removal of the right sided PPM and her RA/RV leads were capped and left in place, and implantation of a new PPM on the left side with details as outlined above.  She was monitored on telemetry overnight which demonstrated AP acing/Vsensed rhythm.  Left and right chest sites are  dry, + ecchymosis, no hematoma.  The device was interrogated and found to be functioning normally.  CXR was obtained and demonstrated no pneumothorax status post device implantation.  Wound care, arm mobility, and restrictions were reviewed with the patient.  The patient was examined by Dr. [name] and considered stable for discharge to home. She is maintaining SR. Will resume her Xarelto Friday.  The patient reports she takes metoprolol BID, it is not on her current home list of medicines, burt was at her last visit a couple weeks ago with Dr. Martinique, uncertain what happened, she has been taking it all along ( I only see that her CCB was stopped). The patient will confirm at home with her paperwork/medicine bottles, if she is in-fact still on it, will call the office to have it placed back on her med list.   Physical Exam: Vitals:   10/09/16 1615 10/09/16 1702 10/09/16 2051 10/10/16 0411  BP:  (!) 169/81 112/86 110/77  Pulse:   60 82  Resp:   15 18  Temp:   98.4 F (36.9 C) 98.6 F (37 C)  TempSrc:   Oral Oral  SpO2:   97% 98%  Weight: 118 lb 9.7 oz (53.8 kg)   125 lb 3.2 oz (56.8 kg)  Height: 5\' 6"  (1.676 m)       GEN- The patient is thin, well appearing, alert and oriented x 3 today.   HEENT: normocephalic, atraumatic; sclera clear, conjunctiva pink; hearing intact; oropharynx clear; neck supple, no JVP Lungs- CTA b/l, normal work of breathing.  No wheezes, rales, rhonchi Heart- RRR, 1/6 SM/DM, no rubs or gallops, PMI not laterally displaced GI- soft, non-tender, non-distended Extremities- no clubbing, cyanosis, or edema MS- no significant deformity, age appropriate atrophy Skin- warm and dry, no rash or lesion, Right chest site is dry, + ecchymosis, no hematoma, left chest wound is dry, + ecchymosis, no hematoma Psych- euthymic mood, full affect Neuro- no gross deficits   Labs:   Lab Results  Component Value Date   WBC 5.6 10/05/2016   HGB 10.7 (L) 10/05/2016   HCT 33.2 (L)  10/05/2016   MCV 92 10/05/2016   PLT 310 10/05/2016     Recent Labs Lab 10/10/16 0226  NA 134*  K 4.1  CL 101  CO2 25  BUN 22*  CREATININE 1.26*  CALCIUM 8.6*  GLUCOSE 103*    Discharge Medications:  Allergies as of 10/10/2016      Reactions   Penicillins Anaphylaxis   Tetanus Toxoid Anaphylaxis   Demerol Other (See Comments)   Severe nausea   Codeine Nausea Only   Severe nausea      Medication List    TAKE these medications   amiodarone 200 MG tablet Commonly known as:  PACERONE Take 1/2 tablet (100 mg) by mouth daily   calcium carbonate 600 MG Tabs tablet Commonly known as:  OS-CAL Take 600 mg by mouth daily.   dicyclomine 10 MG capsule Commonly known as:  BENTYL Take 20 mg by mouth 2 (two) times daily as needed for spasms (As directed).   esomeprazole 40 MG capsule Commonly known as:  NEXIUM Take 40 mg by mouth daily as needed (as directed).   furosemide 20 MG tablet Commonly known as:  LASIX Take 1 tablet (20 mg total) by mouth daily as needed (weight gain).   ICAPS AREDS 2 Caps Take 1 capsule by mouth 2 (two) times daily.   levothyroxine 100 MCG tablet Commonly known as:  SYNTHROID, LEVOTHROID Take 100 mcg by mouth daily.   lisinopril 5 MG tablet Commonly known as:  PRINIVIL,ZESTRIL Take 1 tablet (5 mg total) by mouth daily.   Vitamin D 1000 units capsule Take 1,000 Units by mouth daily.   XARELTO 15 MG Tabs tablet Generic drug:  Rivaroxaban TAKE 1 TABLET BY MOUTH EVERY DAY Notes to patient:  Resume on Friday, 10/12/16       Disposition:  Home Discharge Instructions    Diet - low sodium heart healthy    Complete by:  As directed    Increase activity slowly    Complete by:  As directed      Follow-up Information    [facility] Follow up on 10/19/2016.   Specialty:  Cardiology Why:  10:00AM, wound check Contact information: [address] [place]        Thompson Grayer, MD Follow up on 01/14/2017.   Specialty:  Cardiology Why:  2:15PM Contact information: [place] [address] [phone]           Duration of Discharge Encounter: Greater than 30 minutes including physician time.  Signed, Tommye Standard, PA-C 10/10/2016 10:08 AM   I have seen, examined the patient, and reviewed the above assessment and plan.  On exam, RRR.  Device interrogation is reviewed and normal.  CXR reveals stable leads, no ptx.   Changes to above are made where necessary.    Co Sign: Thompson Grayer, MD 10/10/2016

## 2016-10-10 NOTE — Progress Notes (Signed)
Pt given discharge instructions, IV and cardiac monitor wires removed, instructed to get dressed; pt became lightheaded, disoriented. Was incontinent of urine.  Made mumbling sounds. MD notified.  BP as documented.  Pt assisted back to bed, EKG obtained, IV reinserted, Zofran given for c/o nausea.  Discharge cancelled

## 2016-10-10 NOTE — Progress Notes (Signed)
PPM check by MDT representative with stable measurements, functioning appropriately, no arrhythmias or observations by the device.  Tommye Standard, PA-C

## 2016-10-10 NOTE — Discharge Summary (Signed)
The patient is feeling well.   She has no CP or SOB, no further nausea.  She has been oob to eat and has ambulated with RN supervision.  She did well under her own power without assist and without symptoms down the hall and back.  VSS, telemetry without arrhythmias, no bradycardia device check post-syncope with intact and normal function, stable lead measurements and no device observations/tachyarrhythmias, or AMS episodes. Dr. [name] had been made aware of event, also felt if doing well this afternoon would be OK to discharge.  Exam Head: [place]/AT Neck is supple, no JVD Heart is RRR, 1/6 SM, no rubs, heart sounds are not distant Lungs sound clear b/l Abd is soft, non-tender, + BS 4 quadrants Extrem are warm, no edema b/l B/l procedure sites remain dry, no hematoma, + ecchymosis, unchanged   Patient feels well, she would like to go home.  I have discussed with Dr. Caryl Comes.  Will discharge home with as planned follow up.  Patient is reminded of activity restrictions.  Tommye Standard, PA-C

## 2016-10-10 NOTE — Progress Notes (Addendum)
Patient was just about to discharge, off telemetry, getting dressed to go, when she became dizzy, weak and fainted, family states she was still in front of her chair and were able to help her to the chair without injury, RN upon arrival found the patient just coming around, though very lethargic, perhaps drooling but no facial asymmetry and with in a few moments increasingly more alert, noted bladder incontinent.  The initial BP 129/68, back in bed, EKG A paced, V sensed, unchanged from prior. The patient when I arrived still pale, though AAO and oriented still feeling "bad".  No CP though nauseous, no SOB.   F/u BP supine and feeling a little better and resolved pallor 120/80.  HR/rhythm stable.  IV re-inserted.  Exam is non-focal, equal strength b/l UE and LE, no facial asymetry or difficulty with speech. She tells me she still feels weak, like she may "go out" again, though VSS  Dr. Caryl Comes called, will come and evaluate the patient.  Tommye Standard, PAC   Seen and evaluated  Hx of some orthostatic presyncope with epiphenomena familiar but not identitcal to spell of day.  She was nnoted to be pale, nauseated, but not diaphoretic.  Her 1 st recorded BP was 120  Her norm 140 and in hosp recorded at 200 Some pleuritic pain, now gone No CP no SOB BP 129/68   Pulse 82   Temp 98.6 F (37 C) (Oral)   Resp 13   Ht 5\' 6"  (1.676 m)   Wt 125 lb 3.2 oz (56.8 kg)   SpO2 98%   BMI 20.21 kg/m  JVP flat No rub Crackles R post chest. No edema  Will cehck device But presuming normal function and no identified tachyarrhythmia would presume vasodepressor event   Will get up into chair and ambulate and see how she does  Anticipate discharge later today or tomorrow  Will check CBC  CXR Personally reviewed  this looks better than her lungs sound, and pleuritic pain always worrisome for bleeding  ECG personally reviewed no changes from earlier   1133-12:21

## 2016-10-10 NOTE — Plan of Care (Signed)
Problem: Safety: Goal: Ability to remain free from injury will improve Outcome: Progressing Fall risk bundle in place per policies. No falls, skin break down or other injuries this shift. Will continue to implement safety measures this shift.  Problem: Pain Managment: Goal: General experience of comfort will improve Outcome: Progressing Pt complained of 6/10 pain near operative site one time this shift. Verbal order for prn tylenol obtained. Pt resting with no further complaints of pain. Will continue to monitor.   Problem: Tissue Perfusion: Goal: Risk factors for ineffective tissue perfusion will decrease Outcome: Progressing Pt remains Atrial paced on the monitor. Dressing is clean, dry and intact.

## 2016-10-10 NOTE — Discharge Instructions (Signed)
° ° °  Supplemental Discharge Instructions for  Pacemaker/Defibrillator Patients  Activity No heavy lifting or vigorous activity with your left arm for 6 to 8 weeks.  Do not raise your left arm above your head for one week.  Gradually raise your affected arm as drawn below.  No heavy lifting/vigorous activity with right arm until wound check visit please              10/13/16                    10/14/16                    10/15/16                  10/16/16 __  NO DRIVING for  1 week   ; you may begin driving on  4/49/67   .  WOUND CARE - Keep the both wound areas clean and dry.  Do not get them wet, no showers until cleared to at your wound check visit . - The tape/steri-strips on your wounds will fall off; do not pull them off.  No bandage is needed on the site.  DO  NOT apply any creams, oils, or ointments to the wound area. - If you notice any drainage or discharge from the wound, any swelling or bruising at the site, or you develop a fever > 101? F after you are discharged home, call the office at once.  Special Instructions - You are still able to use cellular telephones; use the ear opposite the side where you have your pacemaker/defibrillator.  Avoid carrying your cellular phone near your device. - When traveling through airports, show security personnel your identification card to avoid being screened in the metal detectors.  Ask the security personnel to use the hand wand. - Avoid arc welding equipment, MRI testing (magnetic resonance imaging), TENS units (transcutaneous nerve stimulators).  Call the office for questions about other devices. - Avoid electrical appliances that are in poor condition or are not properly grounded. - Microwave ovens are safe to be near or to operate.  Additional information for defibrillator patients should your device go off: - If your device goes off ONCE and you feel fine afterward, notify the device clinic nurses. - If your device goes off ONCE and you  do not feel well afterward, call 911. - If your device goes off TWICE, call 911. - If your device goes off THREE times in one day, call 911.  DO NOT DRIVE YOURSELF OR A FAMILY MEMBER WITH A DEFIBRILLATOR TO THE HOSPITAL--CALL 911.

## 2016-10-19 ENCOUNTER — Ambulatory Visit (INDEPENDENT_AMBULATORY_CARE_PROVIDER_SITE_OTHER): Payer: Medicare Other | Admitting: *Deleted

## 2016-10-19 DIAGNOSIS — I495 Sick sinus syndrome: Secondary | ICD-10-CM

## 2016-10-19 LAB — CUP PACEART INCLINIC DEVICE CHECK
Battery Voltage: 2.79 V
Brady Statistic AP VS Percent: 84 %
Brady Statistic AS VP Percent: 0 %
Implantable Lead Implant Date: 20180807
Implantable Lead Implant Date: 20180807
Implantable Lead Model: 5076
Implantable Lead Model: 5076
Implantable Pulse Generator Implant Date: 20180807
Lead Channel Impedance Value: 377 Ohm
Lead Channel Pacing Threshold Amplitude: 0.75 V
Lead Channel Pacing Threshold Amplitude: 1.125 V
Lead Channel Pacing Threshold Pulse Width: 0.4 ms
Lead Channel Sensing Intrinsic Amplitude: 2.8 mV
Lead Channel Sensing Intrinsic Amplitude: 22.4 mV
Lead Channel Setting Pacing Amplitude: 3.5 V
MDC IDC LEAD LOCATION: 753859
MDC IDC LEAD LOCATION: 753860
MDC IDC MSMT BATTERY IMPEDANCE: 100 Ohm
MDC IDC MSMT BATTERY REMAINING LONGEVITY: 119 mo
MDC IDC MSMT LEADCHNL RA PACING THRESHOLD AMPLITUDE: 0.5 V
MDC IDC MSMT LEADCHNL RA PACING THRESHOLD PULSEWIDTH: 0.4 ms
MDC IDC MSMT LEADCHNL RA PACING THRESHOLD PULSEWIDTH: 0.4 ms
MDC IDC MSMT LEADCHNL RV IMPEDANCE VALUE: 592 Ohm
MDC IDC MSMT LEADCHNL RV PACING THRESHOLD AMPLITUDE: 0.5 V
MDC IDC MSMT LEADCHNL RV PACING THRESHOLD PULSEWIDTH: 0.46 ms
MDC IDC SESS DTM: 20180817125400
MDC IDC SET LEADCHNL RV PACING AMPLITUDE: 3.5 V
MDC IDC SET LEADCHNL RV PACING PULSEWIDTH: 0.46 ms
MDC IDC SET LEADCHNL RV SENSING SENSITIVITY: 5.6 mV
MDC IDC STAT BRADY AP VP PERCENT: 0 %
MDC IDC STAT BRADY AS VS PERCENT: 16 %

## 2016-10-19 NOTE — Progress Notes (Signed)
Wound check appointment. Steri-strips removed from implant incision(L chest) and steri strips were also removed from explant incision(R chest). Wounds are without redness or edema. Incision edges approximated for both sites, both wounds are well healed. Normal device function. Thresholds, sensing, and impedances consistent with implant measurements. Device programmed at 3.5V for extra safety margin until 3 month visit. Histogram distribution appropriate for patient and level of activity. 0.2% AT/AF burden, max dur. 32mins 30sec-1:1 SVT, PACs per EGMs + Amio/xarelto for h/o PAF. (1) high ventricular rates noted x 5sec. Patient educated about wound care, arm mobility, lifting restrictions. ROV in 3 months with JA.

## 2016-10-29 ENCOUNTER — Telehealth: Payer: Self-pay

## 2016-10-29 NOTE — Telephone Encounter (Signed)
Device nurse returned call to patient.

## 2016-10-29 NOTE — Telephone Encounter (Signed)
Received voice mail message from patient with her phone number but nothing else.  Unsure reason for call and patient is not currently in [facility] clinic.

## 2016-10-29 NOTE — Telephone Encounter (Signed)
Spoke with [name] [name] informed her that Dr. [name] had reviewed her pacemaker transmission and it was normal. Informed pt that Dr. [name] recommended she make an appointment with a PA/NP for Dr. Martinique in order to discuss medication changes, informed her that I had sent a message over to the scheduler if she does not hear back with any appointment then she should call the office back. Pt voiced understanding

## 2016-10-29 NOTE — Telephone Encounter (Signed)
Spoke with pt, assisted pt with sending in a remote transmission, pt stated that these symptoms of unable to walk long distances started about the time she was taking of Cartia and started off lisinopril, but symptoms were exacerbated after pacemaker implant. Pt stated that her BP has been up and down for the last 10 days, never below 403 systolic. Informed pt that I would review this with Dr. [name] and give her a call back. Pt voiced understanding.

## 2016-10-29 NOTE — Telephone Encounter (Signed)
Attempted call back to patient and no answer or answering machine.

## 2016-10-29 NOTE — Telephone Encounter (Signed)
Spoke with pt, pt stated that when she longs walk distance~ out to the mailbox she get real short of breath and has a terrible feeling, pt stated that over short distances she is fine, asked pt to send in a manual transmission, pt stated that she had not set the monitor yet, walked pt through setting up the monitor, informed pt that the monitor would need to stay plugged up for about 68min before a transmission could be sent.

## 2016-10-30 ENCOUNTER — Telehealth: Payer: Self-pay | Admitting: Cardiology

## 2016-10-30 ENCOUNTER — Other Ambulatory Visit: Payer: Self-pay | Admitting: Internal Medicine

## 2016-11-04 NOTE — Progress Notes (Signed)
[name] [name] Date of Birth: 07/25/1929 Medical Record #[id number]  History of Present Illness: [name] [name] is seen today for follow up dyspnea with history of  Afib and AS.  She has a history of paroxysmal atrial fibrillation with tachy-brady syndrome and is s/p pacemaker implant. She is on chronic anticoagulation with Xarelto. Echo in July 2015 showed moderate aortic stenosis that had progressed since 2012. Repeat in July 2016 and July 2017 showed no change. In April 2017 she had atrial fibrillation that converted spontaneously. This was felt to be triggered by a steroid injection. Her metoprolol was increased at that time. When seen in June 2017 she was noted to be in persistent Afib. We placed her on Flecainide but this was later stopped due to side effects. She was seen in November with recurrent and persistent Afib. Rate was controlled but she was symptomatic. Seen in Afib clinic and started on amiodarone with subsequent conversion to NSR. Ecg in January showed NSR.  When last see she complained  of  SOB with activity- this was present even before amiodarone and hasn't improved with restoration of NSR.  She states that any walking or using her arms makes her SOB. No edema.  She does describe a sinking feeling and discomfort  in her epigastrium that makes her feel very weak. She did have a upper and lower EGD which was negative except for few polyps. She had PFTs in December that showed mild obstructive defect with marked decrease in diffusion capacity. CT of the Abdomen was negative for any pathology. She reports Dr. Virgina Jock did an [place] as well. She recently had a SSCA removed from her leg. Her last Echo was in April as noted below. Myoview study was normal. When seen by Dr. [name] in late July it was noted that she had failure of her RA lead. Her device was reprogrammed but she developed pectoral stimulation. She underwent explantation of her old generator and placement of a new left chest dual chamber  pacemaker on August 7.   On follow up she still has some pain at her new pacemaker site. She has noted some improvement in her breathing walking up steps but still has her other symptoms when she walks at a normal pace. This is quite limiting for her.   Current Outpatient Prescriptions on File Prior to Visit  Medication Sig Dispense Refill  . amiodarone (PACERONE) 200 MG tablet Take 1/2 tablet (100 mg) by mouth daily 90 tablet 3  . calcium carbonate (OS-CAL) 600 MG TABS Take 600 mg by mouth daily.      . Cholecalciferol (VITAMIN D) 1000 UNITS capsule Take 1,000 Units by mouth daily.      Marland Kitchen dicyclomine (BENTYL) 10 MG capsule Take 20 mg by mouth 2 (two) times daily as needed for spasms (As directed).   1  . esomeprazole (NEXIUM) 40 MG capsule Take 40 mg by mouth daily as needed (as directed).     . furosemide (LASIX) 20 MG tablet Take 1 tablet (20 mg total) by mouth daily as needed (weight gain). 30 tablet 6  . levothyroxine (SYNTHROID, LEVOTHROID) 100 MCG tablet Take 100 mcg by mouth daily.      Marland Kitchen lisinopril (PRINIVIL,ZESTRIL) 5 MG tablet Take 1 tablet (5 mg total) by mouth daily. 60 tablet 11  . metoprolol tartrate (LOPRESSOR) 50 MG tablet Take 50 mg by mouth 2 (two) times daily.    . Multiple Vitamins-Minerals (ICAPS AREDS 2) CAPS Take 1 capsule by mouth 2 (two)  times daily.    Alveda Reasons 15 MG TABS tablet TAKE 1 TABLET BY MOUTH EVERY DAY 90 tablet 3   No current facility-administered medications on file prior to visit.     Allergies  Allergen Reactions  . Penicillins Anaphylaxis  . Tetanus Toxoid Anaphylaxis  . Demerol Other (See Comments)    Severe nausea  . Codeine Nausea Only    Severe nausea    Past Medical History:  Diagnosis Date  . Aortic stenosis    mild  . Aortic stenosis   . Arthritis    "back" (10/10/2016)  . Basal cell carcinoma    "legs, arms, face" (10/10/2016)  . Diverticulosis   . Family history of adverse reaction to anesthesia    "sister gets PONV"  . GERD  (gastroesophageal reflux disease)   . Heart murmur   . Hemorrhoids   . History of blood transfusion 1980   "think I had one when I had my hysterectomy"  . Hyperlipidemia   . Hypertension   . Hypothyroidism   . Osteoarthritis    "knees, hands" (10/10/2016)  . Paroxysmal atrial fibrillation (HCC)   . PONV (postoperative nausea and vomiting)   . Sick sinus syndrome (HCC)    s/p PPM (MDT)  . Squamous carcinoma    "legs, arms, face" (10/10/2016)  . Syncope     Past Surgical History:  Procedure Laterality Date  . ABDOMINAL HYSTERECTOMY  1980  . BASAL CELL CARCINOMA EXCISION    . CATARACT EXTRACTION W/ INTRAOCULAR LENS  IMPLANT, BILATERAL Bilateral   . FINGER SURGERY Left    "fell; developed skiers thumb; had to operate on it"  . INSERT / REPLACE / [place]   for syptomatic bradycardia and syncope -- in [facility]  . INSERT / REPLACE / REMOVE PACEMAKER  2001   pulse generator replacement by Dr. Rollene Fare   . INSERT / REPLACE / REMOVE PACEMAKER  04/01/2008   PPM Medtronic -- model # [id number] serial # [id number] -- pulse generator replacement by Dr. Verlon Setting   . LEAD REVISION/REPAIR N/A 10/09/2016   Procedure: Lead Revision/Repair;  Surgeon: Thompson Grayer, MD;  Location: [facility];  Service: Cardiovascular;  Laterality: N/A;  . SQUAMOUS CELL CARCINOMA EXCISION    . TONSILLECTOMY      History  Smoking Status  . Former Smoker  . Packs/day: 1.50  . Years: 30.00  . Types: Cigarettes  . Quit date: 03/05/1978  Smokeless Tobacco  . Never Used    History  Alcohol Use No    Family History  Problem Relation Age of Onset  . Cancer Father        lung cancer  . Alzheimer's disease Mother     Review of Systems: As noted in history of present illness.  All other systems were reviewed and are negative.  Physical Exam: BP (!) 147/81   Pulse 87   Ht 5\' 5"  (1.651 m)   Wt 127 lb (57.6 kg)   SpO2 98%   BMI 21.13 kg/m  Patient is very pleasant and in no acute  distress.  Skin is warm and dry. Color is normal.  HEENT is unremarkable. Normocephalic/atraumatic. PERRL. Sclera are nonicteric. Neck is supple. No masses. No JVD. Lungs are clear. Cardiac exam shows a regular rate and rhythm. Harsh Grade 2/6 systolic murmur RUSB>>apex. Abdomen is soft. Extremities are without edema. Gait and ROM are intact. No gross neurologic deficits noted.  LABORATORY DATA:  Lab Results  Component Value Date  WBC 5.6 10/05/2016   HGB 10.7 (L) 10/05/2016   HCT 33.2 (L) 10/05/2016   PLT 310 10/05/2016   GLUCOSE 103 (H) 10/10/2016   NA 134 (L) 10/10/2016   K 4.1 10/10/2016   CL 101 10/10/2016   CREATININE 1.26 (H) 10/10/2016   BUN 22 (H) 10/10/2016   CO2 25 10/10/2016   TSH 0.90 06/13/2011   INR 1.1 10/05/2016   Labs dated 07/15/15: cholesterol 229, triglycerides 111, HDL 76, LDL 131.  Dated 07/19/16: cholesterol 247, triglycerides 71, HDL 88, LDL 145. BUN 28, creatinine 1.3. Other chemistries, CBC and TSH normal.  Echo: 08/18/15 Study Conclusions  - Left ventricle: The cavity size was normal. Wall thickness was  normal. Systolic function was normal. The estimated ejection  fraction was in the range of 55% to 60%. Wall motion was normal;  there were no regional wall motion abnormalities. Features are  consistent with a pseudonormal left ventricular filling pattern,  with concomitant abnormal relaxation and increased filling  pressure (grade 2 diastolic dysfunction). - Aortic valve: There was moderate stenosis. There was mild to  moderate regurgitation directed centrally in the LVOT. - Mitral valve: There was mild regurgitation directed centrally. - Tricuspid valve: There was mild-moderate regurgitation directed  centrally. - Pulmonary arteries: Systolic pressure was mildly increased. PA  peak pressure: 34 mm Hg (S).  Echo 06/20/16: Study Conclusions  - Left ventricle: The cavity size was normal. There was mild   concentric hypertrophy. Systolic  function was normal. The   estimated ejection fraction was in the range of 55% to 60%. Wall   motion was normal; there were no regional wall motion   abnormalities. - Aortic valve: Valve mobility was restricted. There was moderate   stenosis. There was moderate regurgitation. Peak velocity (S):   370 cm/s. Mean gradient (S): 30 mm Hg. Regurgitation pressure   half-time: 449 ms. - Mitral valve: Transvalvular velocity was within the normal range.   There was no evidence for stenosis. There was mild regurgitation. - Left atrium: The atrium was moderately dilated. - Right ventricle: The cavity size was normal. Wall thickness was   normal. Systolic function was normal. - Tricuspid valve: There was moderate regurgitation. - Pulmonary arteries: Systolic pressure was within the normal   range. PA peak pressure: 36 mm Hg (S).  Impressions:  - No significant changes since 08/2015.  Labs from primary care dated 07/14/15: Normal CMET and CBC. Cholesterol 229, trig 111, HDL 76, LDL 131. TSH 1.98.   Myoview 10/03/16: Study Highlights     The left ventricular ejection fraction is normal (55-65%).  Nuclear stress EF: 59%.  There was no ST segment deviation noted during stress.  The study is normal.   1. EF 59%, normal wall motion.  2. No evidence for ischemia or infarction by perfusion images.   Normal study.       Assessment / Plan: 1. Atrial fibrillation with sick sinus syndrome. Prior history of  persistent AFib. Intolerant of Flecainide. Now on amiodarone since Novemver- converted to NSR and no recurrence.   Continue long-term anticoagulation with Xarelto. Dose adjusted for age and renal function. S/p pacemaker replacement for failed RA lead.   2. Aortic stenosis- moderate by Echo. Echo  in April 2018 showed slight increase in gradient but no significant change.   3. HTN- controlled  4. DOE/ epigastric pain- this has been a persistent and quite limiting problem. Noninvasive  evaluation has been unrevealing as to the cause.  Did not improve with  restoration of NSR. Symptoms preceded amiodarone. AS still in moderate range.  Prior PFTs showed reduction in diffusion capacity but oxygen levels here with exertion look quite good. Myoview study was normal. It is possible her medication may be contributing. We had previously stopped Cardizem without improvement.  Since amiodarone is controlling her Afib she does not need this for rate control.  I feel that she needs a right and left heart cath with coronary angiography for definitive evaluation. The procedure and risks were reviewed including but not limited to death, myocardial infarction, stroke, arrythmias, bleeding, transfusion, emergency surgery, dye allergy, or renal dysfunction. The patient voices understanding and is agreeable to proceed. We will schedule this in 3 weeks. She needs to hold Xarelto for 48 hours prior to cath.  5. Hyperlipidemia. Hasn't been treated in past since HDL so high but if she does have CAD will need to start on statin therapy.

## 2016-11-08 ENCOUNTER — Ambulatory Visit (INDEPENDENT_AMBULATORY_CARE_PROVIDER_SITE_OTHER): Payer: Medicare Other | Admitting: Cardiology

## 2016-11-08 ENCOUNTER — Other Ambulatory Visit: Payer: Self-pay | Admitting: Cardiology

## 2016-11-08 ENCOUNTER — Encounter: Payer: Self-pay | Admitting: Cardiology

## 2016-11-08 VITALS — BP 147/81 | HR 87 | Ht 65.0 in | Wt 127.0 lb

## 2016-11-08 DIAGNOSIS — Z950 Presence of cardiac pacemaker: Secondary | ICD-10-CM

## 2016-11-08 DIAGNOSIS — R0609 Other forms of dyspnea: Secondary | ICD-10-CM | POA: Diagnosis not present

## 2016-11-08 DIAGNOSIS — I350 Nonrheumatic aortic (valve) stenosis: Secondary | ICD-10-CM

## 2016-11-08 DIAGNOSIS — I480 Paroxysmal atrial fibrillation: Secondary | ICD-10-CM | POA: Diagnosis not present

## 2016-11-08 DIAGNOSIS — I10 Essential (primary) hypertension: Secondary | ICD-10-CM | POA: Diagnosis not present

## 2016-11-08 DIAGNOSIS — I209 Angina pectoris, unspecified: Secondary | ICD-10-CM

## 2016-11-08 NOTE — Patient Instructions (Addendum)
   [place] [address] [place] [address] Dept: [phone] Loc: [phone]  TABITHA RIGGINS  11/08/2016  You are scheduled for a Right and Left Cardiac Cath on Tuesday 11/27/16,  with [name]  1. Please arrive at the [facility] (Main Entrance A) at [facility]: [address] [place], [place] [address] at 8:00 am (two hours before your procedure to ensure your preparation). Free valet parking service is available.   Special note: Every effort is made to have your procedure done on time. Please understand that emergencies sometimes delay scheduled procedures.  2. Diet: Nothing to eat or drink after midnight  3. Labs: Tuesday 11/20/16 ( bmet,cbc,pt ) [facility] no appointment needed 8:00 am to 12:00 noon.  4. Medication instructions in preparation for your procedure:      HOLD XARELTO 2 days BEFORE CATH       HOLD FUROSEMIDE MORNING OF CATH          TAKE ASPIRIN 81 mg MORNING OF CATH  5. Plan for one night stay--bring personal belongings.  6. Bring a current list of your medications and current insurance cards.  7. You MUST have a responsible person to drive you home.  8. Someone MUST be with you the first 24 hours after you arrive home or your discharge will be delayed.  9. Please wear clothes that are easy to get on and off and wear slip-on shoes.  Thank you for allowing [place] to care for you!   -- [facility]

## 2016-11-19 ENCOUNTER — Telehealth: Payer: Self-pay

## 2016-11-19 ENCOUNTER — Telehealth: Payer: Self-pay | Admitting: Cardiology

## 2016-11-19 NOTE — Telephone Encounter (Signed)
I think we have done everything short of cath already.  Peter Martinique MD, [facility]

## 2016-11-19 NOTE — Telephone Encounter (Signed)
Received call from patient.She wanted to ask [name] if he could check her heart a different way instead of a heart cath.Stated she had a close friend that had a heart cath recently and something went wrong she had a stroke.Advised [name] out of office this week.I will send message to him.

## 2016-11-19 NOTE — Telephone Encounter (Signed)
Returned call to patient [name]'s advice given.Patient reassured.

## 2016-11-20 LAB — BASIC METABOLIC PANEL
BUN / CREAT RATIO: 16 (ref 12–28)
BUN: 21 mg/dL (ref 8–27)
CHLORIDE: 102 mmol/L (ref 96–106)
CO2: 23 mmol/L (ref 20–29)
Calcium: 9.1 mg/dL (ref 8.7–10.3)
Creatinine, Ser: 1.35 mg/dL — ABNORMAL HIGH (ref 0.57–1.00)
GFR calc Af Amer: 41 mL/min/{1.73_m2} — ABNORMAL LOW (ref >59)
GFR calc non Af Amer: 36 mL/min/{1.73_m2} — ABNORMAL LOW (ref >59)
GLUCOSE: 79 mg/dL (ref 65–99)
Potassium: 4.7 mmol/L (ref 3.5–5.2)
SODIUM: 139 mmol/L (ref 134–144)

## 2016-11-20 LAB — CBC WITH DIFFERENTIAL/PLATELET
BASOS ABS: 0 10*3/uL (ref 0.0–0.2)
Basos: 0 %
EOS (ABSOLUTE): 0.1 10*3/uL (ref 0.0–0.4)
Eos: 1 %
HEMATOCRIT: 32.1 % — AB (ref 34.0–46.6)
Hemoglobin: 10 g/dL — ABNORMAL LOW (ref 11.1–15.9)
Immature Grans (Abs): 0 10*3/uL (ref 0.0–0.1)
Immature Granulocytes: 0 %
LYMPHS ABS: 1.1 10*3/uL (ref 0.7–3.1)
Lymphs: 20 %
MCH: 27.9 pg (ref 26.6–33.0)
MCHC: 31.2 g/dL — AB (ref 31.5–35.7)
MCV: 90 fL (ref 79–97)
Monocytes Absolute: 0.4 10*3/uL (ref 0.1–0.9)
Monocytes: 8 %
Neutrophils Absolute: 3.7 10*3/uL (ref 1.4–7.0)
Neutrophils: 71 %
Platelets: 299 10*3/uL (ref 150–379)
RBC: 3.58 x10E6/uL — AB (ref 3.77–5.28)
RDW: 15 % (ref 12.3–15.4)
WBC: 5.3 10*3/uL (ref 3.4–10.8)

## 2016-11-20 LAB — PROTIME-INR
INR: 1.1 (ref 0.8–1.2)
PROTHROMBIN TIME: 11.3 s (ref 9.1–12.0)

## 2016-11-26 ENCOUNTER — Telehealth: Payer: Self-pay

## 2016-11-26 NOTE — Telephone Encounter (Signed)
Patient contacted pre-catheterization at [facility] scheduled for:  11/27/2016 @ 1030 Verified arrival time and place:  NT @ 0800 Confirmed AM meds to be taken pre-cath with sip of water: Take ASA Xarelto-Stop Sunday/Monday-verified Lasix-hold am Confirmed patient has responsible person to drive home post procedure and observe patient for 24 hours:  yes Addl concerns:  None

## 2016-11-27 ENCOUNTER — Ambulatory Visit (HOSPITAL_COMMUNITY)
Admission: RE | Admit: 2016-11-27 | Discharge: 2016-11-27 | Disposition: A | Discharge status: Home / Self Care | Payer: Medicare Other | Source: Ambulatory Visit | Attending: Cardiology | Admitting: Cardiology

## 2016-11-27 ENCOUNTER — Encounter (HOSPITAL_COMMUNITY)
Admission: RE | Disposition: A | Discharge status: Home / Self Care | Payer: Self-pay | Source: Ambulatory Visit | Attending: Cardiology

## 2016-11-27 DIAGNOSIS — I2584 Coronary atherosclerosis due to calcified coronary lesion: Secondary | ICD-10-CM | POA: Insufficient documentation

## 2016-11-27 DIAGNOSIS — Z950 Presence of cardiac pacemaker: Secondary | ICD-10-CM | POA: Diagnosis not present

## 2016-11-27 DIAGNOSIS — R0609 Other forms of dyspnea: Secondary | ICD-10-CM

## 2016-11-27 DIAGNOSIS — I495 Sick sinus syndrome: Secondary | ICD-10-CM | POA: Diagnosis present

## 2016-11-27 DIAGNOSIS — I10 Essential (primary) hypertension: Secondary | ICD-10-CM | POA: Diagnosis not present

## 2016-11-27 DIAGNOSIS — M1990 Unspecified osteoarthritis, unspecified site: Secondary | ICD-10-CM | POA: Insufficient documentation

## 2016-11-27 DIAGNOSIS — I209 Angina pectoris, unspecified: Secondary | ICD-10-CM

## 2016-11-27 DIAGNOSIS — E785 Hyperlipidemia, unspecified: Secondary | ICD-10-CM | POA: Insufficient documentation

## 2016-11-27 DIAGNOSIS — E039 Hypothyroidism, unspecified: Secondary | ICD-10-CM | POA: Insufficient documentation

## 2016-11-27 DIAGNOSIS — K219 Gastro-esophageal reflux disease without esophagitis: Secondary | ICD-10-CM | POA: Insufficient documentation

## 2016-11-27 DIAGNOSIS — I350 Nonrheumatic aortic (valve) stenosis: Secondary | ICD-10-CM | POA: Insufficient documentation

## 2016-11-27 DIAGNOSIS — Z880 Allergy status to penicillin: Secondary | ICD-10-CM | POA: Diagnosis not present

## 2016-11-27 DIAGNOSIS — I2510 Atherosclerotic heart disease of native coronary artery without angina pectoris: Secondary | ICD-10-CM | POA: Diagnosis not present

## 2016-11-27 DIAGNOSIS — Z87891 Personal history of nicotine dependence: Secondary | ICD-10-CM | POA: Insufficient documentation

## 2016-11-27 DIAGNOSIS — Z7901 Long term (current) use of anticoagulants: Secondary | ICD-10-CM | POA: Diagnosis not present

## 2016-11-27 DIAGNOSIS — I4891 Unspecified atrial fibrillation: Secondary | ICD-10-CM | POA: Diagnosis present

## 2016-11-27 HISTORY — PX: RIGHT/LEFT HEART CATH AND CORONARY ANGIOGRAPHY: CATH118266

## 2016-11-27 LAB — POCT I-STAT 3, ART BLOOD GAS (G3+)
Acid-Base Excess: 1 mmol/L (ref 0.0–2.0)
BICARBONATE: 25.8 mmol/L (ref 20.0–28.0)
O2 SAT: 100 %
PCO2 ART: 39.1 mmHg (ref 32.0–48.0)
PH ART: 7.426 (ref 7.350–7.450)
TCO2: 27 mmol/L (ref 22–32)
pO2, Arterial: 164 mmHg — ABNORMAL HIGH (ref 83.0–108.0)

## 2016-11-27 LAB — POCT I-STAT 3, VENOUS BLOOD GAS (G3P V)
Acid-Base Excess: 1 mmol/L (ref 0.0–2.0)
BICARBONATE: 26.1 mmol/L (ref 20.0–28.0)
O2 SAT: 56 %
PCO2 VEN: 43.6 mmHg — AB (ref 44.0–60.0)
PO2 VEN: 30 mmHg — AB (ref 32.0–45.0)
TCO2: 27 mmol/L (ref 22–32)
pH, Ven: 7.386 (ref 7.250–7.430)

## 2016-11-27 LAB — POCT ACTIVATED CLOTTING TIME: Activated Clotting Time: 153 seconds

## 2016-11-27 SURGERY — RIGHT/LEFT HEART CATH AND CORONARY ANGIOGRAPHY
Anesthesia: LOCAL

## 2016-11-27 MED ORDER — IOPAMIDOL (ISOVUE-370) INJECTION 76%
INTRAVENOUS | Status: AC
  Filled 2016-11-27: qty 100

## 2016-11-27 MED ORDER — RIVAROXABAN 15 MG PO TABS: ORAL_TABLET | ORAL | 3 refills | Status: DC

## 2016-11-27 MED ORDER — ASPIRIN 81 MG PO CHEW: 81.0000 mg | CHEWABLE_TABLET | ORAL | Status: DC

## 2016-11-27 MED ORDER — VERAPAMIL HCL 2.5 MG/ML IV SOLN
INTRAVENOUS | Status: AC
  Filled 2016-11-27: qty 2

## 2016-11-27 MED ORDER — IOPAMIDOL (ISOVUE-370) INJECTION 76%
INTRAVENOUS | Status: DC | PRN
  Administered 2016-11-27: 55 mL via INTRA_ARTERIAL

## 2016-11-27 MED ORDER — HEPARIN SODIUM (PORCINE) 1000 UNIT/ML IJ SOLN
INTRAMUSCULAR | Status: AC
Start: 2016-11-27 — End: ?
  Filled 2016-11-27: qty 1

## 2016-11-27 MED ORDER — MIDAZOLAM HCL 2 MG/2ML IJ SOLN
INTRAMUSCULAR | Status: AC
Start: 2016-11-27 — End: ?
  Filled 2016-11-27: qty 2

## 2016-11-27 MED ORDER — HEPARIN SODIUM (PORCINE) 1000 UNIT/ML IJ SOLN
INTRAMUSCULAR | Status: DC | PRN
  Administered 2016-11-27: 3000 [IU] via INTRAVENOUS

## 2016-11-27 MED ORDER — SODIUM CHLORIDE 0.9% FLUSH: 3.0000 mL | INTRAVENOUS | Status: DC | PRN

## 2016-11-27 MED ORDER — SODIUM CHLORIDE 0.9% FLUSH: 3.0000 mL | Freq: Two times a day (BID) | INTRAVENOUS | Status: DC

## 2016-11-27 MED ORDER — SODIUM CHLORIDE 0.9 % IV SOLN: 250.0000 mL | INTRAVENOUS | Status: DC | PRN

## 2016-11-27 MED ORDER — LIDOCAINE HCL 2 % IJ SOLN
INTRAMUSCULAR | Status: AC
  Filled 2016-11-27: qty 10

## 2016-11-27 MED ORDER — HEPARIN (PORCINE) IN NACL 2-0.9 UNIT/ML-% IJ SOLN
INTRAMUSCULAR | Status: AC | PRN
  Administered 2016-11-27: 1000 mL

## 2016-11-27 MED ORDER — SODIUM CHLORIDE 0.9 % WEIGHT BASED INFUSION: 1.0000 mL/kg/h | INTRAVENOUS | Status: AC

## 2016-11-27 MED ORDER — LIDOCAINE HCL (PF) 1 % IJ SOLN
INTRAMUSCULAR | Status: DC | PRN
  Administered 2016-11-27: 5 mL
  Administered 2016-11-27: 15 mL

## 2016-11-27 MED ORDER — SODIUM CHLORIDE 0.9 % WEIGHT BASED INFUSION
3.0000 mL/kg/h | INTRAVENOUS | Status: AC
  Administered 2016-11-27: 3 mL/kg/h via INTRAVENOUS

## 2016-11-27 MED ORDER — SODIUM CHLORIDE 0.9 % WEIGHT BASED INFUSION: 1.0000 mL/kg/h | INTRAVENOUS | Status: DC

## 2016-11-27 MED ORDER — FENTANYL CITRATE (PF) 100 MCG/2ML IJ SOLN
INTRAMUSCULAR | Status: DC | PRN
  Administered 2016-11-27: 25 ug via INTRAVENOUS

## 2016-11-27 MED ORDER — HEPARIN (PORCINE) IN NACL 2-0.9 UNIT/ML-% IJ SOLN
INTRAMUSCULAR | Status: AC
  Filled 2016-11-27: qty 1000

## 2016-11-27 MED ORDER — FENTANYL CITRATE (PF) 100 MCG/2ML IJ SOLN
INTRAMUSCULAR | Status: AC
  Filled 2016-11-27: qty 2

## 2016-11-27 MED ORDER — MIDAZOLAM HCL 2 MG/2ML IJ SOLN
INTRAMUSCULAR | Status: DC | PRN
  Administered 2016-11-27: 1 mg via INTRAVENOUS

## 2016-11-27 SURGICAL SUPPLY — 16 items
CATH 5FR JL3.5 JR4 ANG PIG MP (CATHETERS) ×1 IMPLANT
CATH BALLN WEDGE 5F 110CM (CATHETERS) ×1 IMPLANT
CATH INFINITI 5FR AL1 (CATHETERS) ×1 IMPLANT
CATH SWAN GANZ 7F STRAIGHT (CATHETERS) ×1 IMPLANT
DEVICE RAD COMP TR BAND LRG (VASCULAR PRODUCTS) ×1 IMPLANT
GLIDESHEATH SLEND SS 6F .021 (SHEATH) ×1 IMPLANT
GUIDEWIRE INQWIRE 1.5J.035X260 (WIRE) IMPLANT
INQWIRE 1.5J .035X260CM (WIRE) ×2
KIT HEART LEFT (KITS) ×2 IMPLANT
PACK CARDIAC CATHETERIZATION (CUSTOM PROCEDURE TRAY) ×2 IMPLANT
SHEATH GLIDE SLENDER 4/5FR (SHEATH) ×1 IMPLANT
SHEATH PINNACLE 7F 10CM (SHEATH) ×1 IMPLANT
TRANSDUCER W/STOPCOCK (MISCELLANEOUS) ×2 IMPLANT
TUBING CIL FLEX 10 FLL-RA (TUBING) ×2 IMPLANT
WIRE EMERALD 3MM-J .025X260CM (WIRE) ×1 IMPLANT
WIRE EMERALD ST .035X150CM (WIRE) ×1 IMPLANT

## 2016-11-27 NOTE — Discharge Instructions (Addendum)
May resume Xarelto on 11/28/16 Radial Site Care Refer to this sheet in the next few weeks. These instructions provide you with information about caring for yourself after your procedure. Your health care provider may also give you more specific instructions. Your treatment has been planned according to current medical practices, but problems sometimes occur. Call your health care provider if you have any problems or questions after your procedure. What can I expect after the procedure? After your procedure, it is typical to have the following:  Bruising at the radial site that usually fades within 1-2 weeks.  Blood collecting in the tissue (hematoma) that may be painful to the touch. It should usually decrease in size and tenderness within 1-2 weeks.  Follow these instructions at home:  Take medicines only as directed by your health care provider.  You may shower 24-48 hours after the procedure or as directed by your health care provider. Remove the bandage (dressing) and gently wash the site with plain soap and water. Pat the area dry with a clean towel. Do not rub the site, because this may cause bleeding.  Do not take baths, swim, or use a hot tub until your health care provider approves.  Check your insertion site every day for redness, swelling, or drainage.  Do not apply powder or lotion to the site.  Do not flex or bend the affected arm for 24 hours or as directed by your health care provider.  Do not push or pull heavy objects with the affected arm for 24 hours or as directed by your health care provider.  Do not lift over 10 lb (4.5 kg) for 5 days after your procedure or as directed by your health care provider.  Ask your health care provider when it is okay to: ? Return to work or school. ? Resume usual physical activities or sports. ? Resume sexual activity.  Do not drive home if you are discharged the same day as the procedure. Have someone else drive you.  You may drive  24 hours after the procedure unless otherwise instructed by your health care provider.  Do not operate machinery or power tools for 24 hours after the procedure.  If your procedure was done as an outpatient procedure, which means that you went home the same day as your procedure, a responsible adult should be with you for the first 24 hours after you arrive home.  Keep all follow-up visits as directed by your health care provider. This is important. Contact a health care provider if:  You have a fever.  You have chills.  You have increased bleeding from the radial site. Hold pressure on the site. Get help right away if:  You have unusual pain at the radial site.  You have redness, warmth, or swelling at the radial site.  You have drainage (other than a small amount of blood on the dressing) from the radial site.  The radial site is bleeding, and the bleeding does not stop after 30 minutes of holding steady pressure on the site.  Your arm or hand becomes pale, cool, tingly, or numb. This information is not intended to replace advice given to you by your health care provider. Make sure you discuss any questions you have with your health care provider. Document Released: 03/24/2010 Document Revised: 07/28/2015 Document Reviewed: 09/07/2013 Elsevier Interactive Patient Education  2018 [place] After This sheet gives you information about how to care for yourself after your procedure. Your health care provider  may also give you more specific instructions. If you have problems or questions, contact your health care provider. What can I expect after the procedure? After the procedure, it is common to have:  Bruising or mild discomfort in the area where the IV was inserted (insertion site).  Follow these instructions at home: Eating and drinking  Follow instructions from your health care provider about eating or drinking restrictions.  Drink a lot of fluids for  the first several days after the procedure, as directed by your health care provider. This helps to wash (flush) the contrast out of your body. Examples of healthy fluids include water or low-calorie drinks. General instructions  Check your IV insertion area every day for signs of infection. Check for: ? Redness, swelling, or pain. ? Fluid or blood. ? Warmth. ? Pus or a bad smell.  Take over-the-counter and prescription medicines only as told by your health care provider.  Rest and return to your normal activities as told by your health care provider. Ask your health care provider what activities are safe for you.  Do not drive for 24 hours if you were given a medicine to help you relax (sedative), or until your health care provider approves.  Keep all follow-up visits as told by your health care provider. This is important. Contact a health care provider if:  Your skin becomes itchy or you develop a rash or hives.  You have a fever that does not get better with medicine.  You feel nauseous.  You vomit.  You have redness, swelling, or pain around the insertion site.  You have fluid or blood coming from the insertion site.  Your insertion area feels warm to the touch.  You have pus or a bad smell coming from the insertion site. Get help right away if:  You have difficulty breathing or shortness of breath.  You develop chest pain.  You faint.  You feel very dizzy. These symptoms may represent a serious problem that is an emergency. Do not wait to see if the symptoms will go away. Get medical help right away. Call your local emergency services (911 in the [place] Do not drive yourself to the hospital. Summary  After your procedure, it is common to have bruising or mild discomfort in the area where the IV was inserted.  You should check your IV insertion area every day for signs of infection.  Take over-the-counter and prescription medicines only as told by your health  care provider.  You should drink a lot of fluids for the first several days after the procedure to help flush the contrast from your body. This information is not intended to replace advice given to you by your health care provider. Make sure you discuss any questions you have with your health care provider. Document Released: 12/10/2012 Document Revised: 01/14/2016 Document Reviewed: 01/14/2016 Elsevier Interactive Patient Education  2017 [employer].

## 2016-11-27 NOTE — Progress Notes (Signed)
Site area: Right groin 7 french venous sheath was removed  Site Prior to Removal:  Level 0  Pressure Applied For 15 MINUTES    Bedrest Beginning at 1120a  Manual:   Yes.    Patient Status During Pull:  stable  Post Pull Groin Site:  Level 0  Post Pull Instructions Given:  Yes.    Post Pull Pulses Present:  Yes.    Dressing Applied:  Yes.    Comments:  VS remain stable during sheath

## 2016-11-27 NOTE — H&P (View-Only) (Signed)
[name] [name] Date of Birth: Oct 02, 1929 Medical Record #[id number]  History of Present Illness: [name] is seen today for follow up dyspnea with history of  Afib and AS.  She has a history of paroxysmal atrial fibrillation with tachy-brady syndrome and is s/p pacemaker implant. She is on chronic anticoagulation with Xarelto. Echo in July 2015 showed moderate aortic stenosis that had progressed since 2012. Repeat in July 2016 and July 2017 showed no change. In April 2017 she had atrial fibrillation that converted spontaneously. This was felt to be triggered by a steroid injection. Her metoprolol was increased at that time. When seen in June 2017 she was noted to be in persistent Afib. We placed her on Flecainide but this was later stopped due to side effects. She was seen in November with recurrent and persistent Afib. Rate was controlled but she was symptomatic. Seen in Afib clinic and started on amiodarone with subsequent conversion to NSR. Ecg in January showed NSR.  When last see she complained  of  SOB with activity- this was present even before amiodarone and hasn't improved with restoration of NSR.  She states that any walking or using her arms makes her SOB. No edema.  She does describe a sinking feeling and discomfort  in her epigastrium that makes her feel very weak. She did have a upper and lower EGD which was negative except for few polyps. She had PFTs in December that showed mild obstructive defect with marked decrease in diffusion capacity. CT of the Abdomen was negative for any pathology. She reports Dr. Virgina Jock did an [place] as well. She recently had a SSCA removed from her leg. Her last Echo was in April as noted below. Myoview study was normal. When seen by Dr. [name] in late July it was noted that she had failure of her RA lead. Her device was reprogrammed but she developed pectoral stimulation. She underwent explantation of her old generator and placement of a new left chest dual chamber  pacemaker on August 7.   On follow up she still has some pain at her new pacemaker site. She has noted some improvement in her breathing walking up steps but still has her other symptoms when she walks at a normal pace. This is quite limiting for her.   Current Outpatient Prescriptions on File Prior to Visit  Medication Sig Dispense Refill  . amiodarone (PACERONE) 200 MG tablet Take 1/2 tablet (100 mg) by mouth daily 90 tablet 3  . calcium carbonate (OS-CAL) 600 MG TABS Take 600 mg by mouth daily.      . Cholecalciferol (VITAMIN D) 1000 UNITS capsule Take 1,000 Units by mouth daily.      Marland Kitchen dicyclomine (BENTYL) 10 MG capsule Take 20 mg by mouth 2 (two) times daily as needed for spasms (As directed).   1  . esomeprazole (NEXIUM) 40 MG capsule Take 40 mg by mouth daily as needed (as directed).     . furosemide (LASIX) 20 MG tablet Take 1 tablet (20 mg total) by mouth daily as needed (weight gain). 30 tablet 6  . levothyroxine (SYNTHROID, LEVOTHROID) 100 MCG tablet Take 100 mcg by mouth daily.      Marland Kitchen lisinopril (PRINIVIL,ZESTRIL) 5 MG tablet Take 1 tablet (5 mg total) by mouth daily. 60 tablet 11  . metoprolol tartrate (LOPRESSOR) 50 MG tablet Take 50 mg by mouth 2 (two) times daily.    . Multiple Vitamins-Minerals (ICAPS AREDS 2) CAPS Take 1 capsule by mouth 2 (two)  times daily.    Alveda Reasons 15 MG TABS tablet TAKE 1 TABLET BY MOUTH EVERY DAY 90 tablet 3   No current facility-administered medications on file prior to visit.     Allergies  Allergen Reactions  . Penicillins Anaphylaxis  . Tetanus Toxoid Anaphylaxis  . Demerol Other (See Comments)    Severe nausea  . Codeine Nausea Only    Severe nausea    Past Medical History:  Diagnosis Date  . Aortic stenosis    mild  . Aortic stenosis   . Arthritis    "back" (10/10/2016)  . Basal cell carcinoma    "legs, arms, face" (10/10/2016)  . Diverticulosis   . Family history of adverse reaction to anesthesia    "sister gets PONV"  . GERD  (gastroesophageal reflux disease)   . Heart murmur   . Hemorrhoids   . History of blood transfusion 1980   "think I had one when I had my hysterectomy"  . Hyperlipidemia   . Hypertension   . Hypothyroidism   . Osteoarthritis    "knees, hands" (10/10/2016)  . Paroxysmal atrial fibrillation (HCC)   . PONV (postoperative nausea and vomiting)   . Sick sinus syndrome (HCC)    s/p PPM (MDT)  . Squamous carcinoma    "legs, arms, face" (10/10/2016)  . Syncope     Past Surgical History:  Procedure Laterality Date  . ABDOMINAL HYSTERECTOMY  1980  . BASAL CELL CARCINOMA EXCISION    . CATARACT EXTRACTION W/ INTRAOCULAR LENS  IMPLANT, BILATERAL Bilateral   . FINGER SURGERY Left    "fell; developed skiers thumb; had to operate on it"  . INSERT / REPLACE / [place]   for syptomatic bradycardia and syncope -- in [facility]  . INSERT / REPLACE / REMOVE PACEMAKER  2001   pulse generator replacement by Dr. Rollene Fare   . INSERT / REPLACE / REMOVE PACEMAKER  04/01/2008   PPM Medtronic -- model # [id number] serial # [id number] H -- pulse generator replacement by Dr. Verlon Setting   . LEAD REVISION/REPAIR N/A 10/09/2016   Procedure: Lead Revision/Repair;  Surgeon: Thompson Grayer, MD;  Location: [facility];  Service: Cardiovascular;  Laterality: N/A;  . SQUAMOUS CELL CARCINOMA EXCISION    . TONSILLECTOMY      History  Smoking Status  . Former Smoker  . Packs/day: 1.50  . Years: 30.00  . Types: Cigarettes  . Quit date: 03/05/1978  Smokeless Tobacco  . Never Used    History  Alcohol Use No    Family History  Problem Relation Age of Onset  . Cancer Father        lung cancer  . Alzheimer's disease Mother     Review of Systems: As noted in history of present illness.  All other systems were reviewed and are negative.  Physical Exam: BP (!) 147/81   Pulse 87   Ht 5\' 5"  (1.651 m)   Wt 127 lb (57.6 kg)   SpO2 98%   BMI 21.13 kg/m  Patient is very pleasant and in no acute  distress.  Skin is warm and dry. Color is normal.  HEENT is unremarkable. Normocephalic/atraumatic. PERRL. Sclera are nonicteric. Neck is supple. No masses. No JVD. Lungs are clear. Cardiac exam shows a regular rate and rhythm. Harsh Grade 2/6 systolic murmur RUSB>>apex. Abdomen is soft. Extremities are without edema. Gait and ROM are intact. No gross neurologic deficits noted.  LABORATORY DATA:  Lab Results  Component Value Date  WBC 5.6 10/05/2016   HGB 10.7 (L) 10/05/2016   HCT 33.2 (L) 10/05/2016   PLT 310 10/05/2016   GLUCOSE 103 (H) 10/10/2016   NA 134 (L) 10/10/2016   K 4.1 10/10/2016   CL 101 10/10/2016   CREATININE 1.26 (H) 10/10/2016   BUN 22 (H) 10/10/2016   CO2 25 10/10/2016   TSH 0.90 06/13/2011   INR 1.1 10/05/2016   Labs dated 07/15/15: cholesterol 229, triglycerides 111, HDL 76, LDL 131.  Dated 07/19/16: cholesterol 247, triglycerides 71, HDL 88, LDL 145. BUN 28, creatinine 1.3. Other chemistries, CBC and TSH normal.  Echo: 08/18/15 Study Conclusions  - Left ventricle: The cavity size was normal. Wall thickness was  normal. Systolic function was normal. The estimated ejection  fraction was in the range of 55% to 60%. Wall motion was normal;  there were no regional wall motion abnormalities. Features are  consistent with a pseudonormal left ventricular filling pattern,  with concomitant abnormal relaxation and increased filling  pressure (grade 2 diastolic dysfunction). - Aortic valve: There was moderate stenosis. There was mild to  moderate regurgitation directed centrally in the LVOT. - Mitral valve: There was mild regurgitation directed centrally. - Tricuspid valve: There was mild-moderate regurgitation directed  centrally. - Pulmonary arteries: Systolic pressure was mildly increased. PA  peak pressure: 34 mm Hg (S).  Echo 06/20/16: Study Conclusions  - Left ventricle: The cavity size was normal. There was mild   concentric hypertrophy. Systolic  function was normal. The   estimated ejection fraction was in the range of 55% to 60%. Wall   motion was normal; there were no regional wall motion   abnormalities. - Aortic valve: Valve mobility was restricted. There was moderate   stenosis. There was moderate regurgitation. Peak velocity (S):   370 cm/s. Mean gradient (S): 30 mm Hg. Regurgitation pressure   half-time: 449 ms. - Mitral valve: Transvalvular velocity was within the normal range.   There was no evidence for stenosis. There was mild regurgitation. - Left atrium: The atrium was moderately dilated. - Right ventricle: The cavity size was normal. Wall thickness was   normal. Systolic function was normal. - Tricuspid valve: There was moderate regurgitation. - Pulmonary arteries: Systolic pressure was within the normal   range. PA peak pressure: 36 mm Hg (S).  Impressions:  - No significant changes since 08/2015.  Labs from primary care dated 07/14/15: Normal CMET and CBC. Cholesterol 229, trig 111, HDL 76, LDL 131. TSH 1.98.   Myoview 10/03/16: Study Highlights     The left ventricular ejection fraction is normal (55-65%).  Nuclear stress EF: 59%.  There was no ST segment deviation noted during stress.  The study is normal.   1. EF 59%, normal wall motion.  2. No evidence for ischemia or infarction by perfusion images.   Normal study.       Assessment / Plan: 1. Atrial fibrillation with sick sinus syndrome. Prior history of  persistent AFib. Intolerant of Flecainide. Now on amiodarone since Novemver- converted to NSR and no recurrence.   Continue long-term anticoagulation with Xarelto. Dose adjusted for age and renal function. S/p pacemaker replacement for failed RA lead.   2. Aortic stenosis- moderate by Echo. Echo  in April 2018 showed slight increase in gradient but no significant change.   3. HTN- controlled  4. DOE/ epigastric pain- this has been a persistent and quite limiting problem. Noninvasive  evaluation has been unrevealing as to the cause.  Did not improve with  restoration of NSR. Symptoms preceded amiodarone. AS still in moderate range.  Prior PFTs showed reduction in diffusion capacity but oxygen levels here with exertion look quite good. Myoview study was normal. It is possible her medication may be contributing. We had previously stopped Cardizem without improvement.  Since amiodarone is controlling her Afib she does not need this for rate control.  I feel that she needs a right and left heart cath with coronary angiography for definitive evaluation. The procedure and risks were reviewed including but not limited to death, myocardial infarction, stroke, arrythmias, bleeding, transfusion, emergency surgery, dye allergy, or renal dysfunction. The patient voices understanding and is agreeable to proceed. We will schedule this in 3 weeks. She needs to hold Xarelto for 48 hours prior to cath.  5. Hyperlipidemia. Hasn't been treated in past since HDL so high but if she does have CAD will need to start on statin therapy.

## 2016-11-27 NOTE — Progress Notes (Signed)
Site area: Right brachial a 5 french venous sheath was removed  Site Prior to Removal:  Level 0  Pressure Applied For 10 MINUTES    Bedrest Beginning at 1120am  Manual:   Yes.    Patient Status During Pull:  stable  Post Pull Groin Site:  Level 0  Post Pull Instructions Given:  Yes.    Post Pull Pulses Present:  Yes.    Dressing Applied:  Yes.    Comments:  Stable

## 2016-11-27 NOTE — Interval H&P Note (Signed)
History and Physical Interval Note:  11/27/2016 9:46 AM  [name] [name]  has presented today for surgery, with the diagnosis of aortic stenosis  The various methods of treatment have been discussed with the patient and family. After consideration of risks, benefits and other options for treatment, the patient has consented to  Procedure(s): RIGHT/LEFT HEART CATH AND CORONARY ANGIOGRAPHY (N/A) as a surgical intervention .  The patient's history has been reviewed, patient examined, no change in status, stable for surgery.  I have reviewed the patient's chart and labs.  Questions were answered to the patient's satisfaction.    Cath Lab Visit (complete for each Cath Lab visit)  Clinical Evaluation Leading to the Procedure:   ACS: No.  Non-ACS:    Anginal Classification: CCS III  Anti-ischemic medical therapy: No Therapy  Non-Invasive Test Results: No non-invasive testing performed  Prior CABG: No previous CABG       Collier Salina [facility] 11/27/2016 9:46 AM

## 2016-11-28 ENCOUNTER — Encounter (HOSPITAL_COMMUNITY): Payer: Self-pay | Admitting: Cardiology

## 2016-11-28 MED FILL — Lidocaine HCl Local Inj 2%: INTRAMUSCULAR | Qty: 10 | Status: AC

## 2016-11-28 MED FILL — Verapamil HCl IV Soln 2.5 MG/ML: INTRAVENOUS | Qty: 2 | Status: AC

## 2016-12-01 NOTE — Telephone Encounter (Signed)
Closed encounter °

## 2016-12-04 ENCOUNTER — Telehealth: Payer: Self-pay | Admitting: Cardiology

## 2016-12-04 NOTE — Telephone Encounter (Signed)
°  New Prob  Pt states she had a heart catheterization 1 week ago. Now she reports pain in her R arm. Requesting to speak to a nurse.

## 2016-12-04 NOTE — Telephone Encounter (Signed)
Communicated Dr. Doug Sou recommendations - patient aware to call if any worse symptoms or new concerns, voiced understanding and thanks.

## 2016-12-04 NOTE — Telephone Encounter (Signed)
Pt of Dr. Martinique  Had heart cath 1 week ago  Now reports R arm pain  Denies sharp stabbing pain States "achey" from under the arm down to where they had put in the IV (antecubital). Notes the antecubital was sore from the day of procedure, but this other "achey" pain started yesterday. Pain is relieved w moving around.  I asked about bruising in the skin, redness, swelling, etc  "Lord honey, I stay bruised". She can't tell whether the site is more bruised in this arm than elsewhere.  She denies obvious redness or swelling.  Informed pt I would route to MD for advice & f/u. She voiced understanding and thanks.

## 2016-12-04 NOTE — Telephone Encounter (Signed)
Would just monitor for now. I am scheduled to see her on the 14th and can assess then. If it worsens let me know.  Lovelyn Sheeran Martinique MD, [facility]

## 2016-12-13 NOTE — Progress Notes (Signed)
[name] [name] Date of Birth: 1929-03-23 Medical Record #[id number]  History of Present Illness: [name] [name] is seen today for follow up s/p recent cardiac cath.  She has a history of paroxysmal atrial fibrillation with tachy-brady syndrome and is s/p pacemaker implant. She is on chronic anticoagulation with Xarelto. Echo in July 2015 showed moderate aortic stenosis that had progressed since 2012. Repeat in July 2016 and July 2017 showed no change. In April 2017 she had atrial fibrillation that converted spontaneously. This was felt to be triggered by a steroid injection. Her metoprolol was increased at that time. When seen in June 2017 she was noted to be in persistent Afib. We placed her on Flecainide but this was later stopped due to side effects. She was seen in November with recurrent and persistent Afib. Rate was controlled but she was symptomatic. Seen in Afib clinic and started on amiodarone with subsequent conversion to NSR. Ecg in January showed NSR.  When last see she complained  of  SOB with activity- this was present even before amiodarone and hasn't improved with restoration of NSR.  She states that any walking or using her arms makes her SOB. No edema.  She does describe a sinking feeling and discomfort  in her epigastrium that makes her feel very weak. She did have a upper and lower EGD which was negative except for few polyps. She had PFTs in December that showed mild obstructive defect with marked decrease in diffusion capacity. CT of the Abdomen was negative for any pathology. She reports Dr. Virgina Jock did an [place] as well. Her last Echo was in April as noted below. Myoview study was normal. When seen by Dr. [name] in late July it was noted that she had failure of her RA lead. Her device was reprogrammed but she developed pectoral stimulation. She underwent explantation of her old generator and placement of a new left chest dual chamber pacemaker on August 7. Because of her ongoing symptoms we  recommended a right and left heart cath and this was done on 11/27/16. This demonstrated severe AS as well as a severe stenosis in the mid LAD. She is seen to review and discuss treatment options.   On follow up she is doing ok.  She has no complications from her cardiac cath. She continues to complain of dyspnea on exertion. This is quite limiting for her.   Current Outpatient Prescriptions on File Prior to Visit  Medication Sig Dispense Refill  . acetaminophen (TYLENOL) 500 MG tablet Take 500 mg by mouth every 6 (six) hours as needed (for pain.).    Marland Kitchen amiodarone (PACERONE) 200 MG tablet Take 1/2 tablet (100 mg) by mouth daily (Patient taking differently: Take 100 mg by mouth daily. ) 90 tablet 3  . calcium carbonate (OS-CAL) 600 MG TABS Take 600 mg by mouth every evening.     . Cholecalciferol (VITAMIN D) 1000 UNITS capsule Take 1,000 Units by mouth daily.      Marland Kitchen dicyclomine (BENTYL) 10 MG capsule Take 10 mg by mouth 2 (two) times daily as needed for spasms.   1  . esomeprazole (NEXIUM) 40 MG capsule Take 40 mg by mouth daily as needed (for acid reflux/indigestion.).     Marland Kitchen furosemide (LASIX) 20 MG tablet Take 1 tablet (20 mg total) by mouth daily as needed (weight gain). (Patient taking differently: Take 20 mg by mouth daily as needed (for fluid retention/weight gain.). ) 30 tablet 6  . levothyroxine (SYNTHROID, LEVOTHROID) 100 MCG tablet  Take 100 mcg by mouth daily before breakfast.     . lisinopril (PRINIVIL,ZESTRIL) 5 MG tablet Take 1 tablet (5 mg total) by mouth daily. 60 tablet 11  . metoprolol tartrate (LOPRESSOR) 50 MG tablet Take 50 mg by mouth 2 (two) times daily.    . Multiple Vitamins-Minerals (ICAPS AREDS 2) CAPS Take 1 capsule by mouth 2 (two) times daily.    . polyvinyl alcohol (LIQUIFILM TEARS) 1.4 % ophthalmic solution Place 1-2 drops into both eyes 3 (three) times daily as needed for dry eyes.     No current facility-administered medications on file prior to visit.      Allergies  Allergen Reactions  . Penicillins Anaphylaxis    Has patient had a PCN reaction causing immediate rash, facial/tongue/throat swelling, SOB or lightheadedness with hypotension: Yes Has patient had a PCN reaction causing severe rash involving mucus membranes or skin necrosis: No Has patient had a PCN reaction that required hospitalization: No Has patient had a PCN reaction occurring within the last 10 years: No If all of the above answers are "NO", then may proceed with Cephalosporin use.   . Tetanus Toxoid Anaphylaxis  . Demerol Other (See Comments)    Severe nausea  . Codeine Nausea Only    Severe nausea    Past Medical History:  Diagnosis Date  . Aortic stenosis    mild  . Aortic stenosis   . Arthritis    "back" (10/10/2016)  . Basal cell carcinoma    "legs, arms, face" (10/10/2016)  . Diverticulosis   . Family history of adverse reaction to anesthesia    "sister gets PONV"  . GERD (gastroesophageal reflux disease)   . Heart murmur   . Hemorrhoids   . History of blood transfusion 1980   "think I had one when I had my hysterectomy"  . Hyperlipidemia   . Hypertension   . Hypothyroidism   . Osteoarthritis    "knees, hands" (10/10/2016)  . Paroxysmal atrial fibrillation (HCC)   . PONV (postoperative nausea and vomiting)   . Sick sinus syndrome (HCC)    s/p PPM (MDT)  . Squamous carcinoma    "legs, arms, face" (10/10/2016)  . Syncope     Past Surgical History:  Procedure Laterality Date  . ABDOMINAL HYSTERECTOMY  1980  . BASAL CELL CARCINOMA EXCISION    . CATARACT EXTRACTION W/ INTRAOCULAR LENS  IMPLANT, BILATERAL Bilateral   . FINGER SURGERY Left    "fell; developed skiers thumb; had to operate on it"  . INSERT / REPLACE / [place]   for syptomatic bradycardia and syncope -- in [facility]  . INSERT / REPLACE / REMOVE PACEMAKER  2001   pulse generator replacement by Dr. Rollene Fare   . INSERT / REPLACE / REMOVE PACEMAKER  04/01/2008   PPM  [employer] -- model # [id number] serial # [id number] -- pulse generator replacement by Dr. Verlon Setting   . LEAD REVISION/REPAIR N/A 10/09/2016   Procedure: Lead Revision/Repair;  Surgeon: Thompson Grayer, MD;  Location: [place] [facility];  Service: Cardiovascular;  Laterality: N/A;  . RIGHT/LEFT HEART CATH AND CORONARY ANGIOGRAPHY N/A 11/27/2016   Procedure: RIGHT/LEFT HEART CATH AND CORONARY ANGIOGRAPHY;  Surgeon: Martinique, Kyliee Ortego M, MD;  Location: [place] [facility];  Service: Cardiovascular;  Laterality: N/A;  . SQUAMOUS CELL CARCINOMA EXCISION    . TONSILLECTOMY      History  Smoking Status  . Former Smoker  . Packs/day: 1.50  . Years: 30.00  . Types:  Cigarettes  . Quit date: 03/05/1978  Smokeless Tobacco  . Never Used    History  Alcohol Use No    Family History  Problem Relation Age of Onset  . Cancer Father        lung cancer  . Alzheimer's disease Mother     Review of Systems: As noted in history of present illness.  All other systems were reviewed and are negative.  Physical Exam: BP (!) 143/82   Pulse 97   Ht 5\' 6"  (1.676 m)   Wt 128 lb 3.2 oz (58.2 kg)   BMI 20.69 kg/m  Patient is very pleasant and in no acute distress.  Skin is warm and dry. Color is normal.  HEENT is unremarkable. Normocephalic/atraumatic. PERRL. Sclera are nonicteric. Neck is supple. No masses. No JVD. Lungs are clear. Cardiac exam shows a regular rate and rhythm. Harsh Grade 2-7/2 systolic murmur RUSB>>apex. Abdomen is soft. Extremities are without edema. Gait and ROM are intact. No gross neurologic deficits noted.  LABORATORY DATA:  Lab Results  Component Value Date   WBC 5.3 11/20/2016   HGB 10.0 (L) 11/20/2016   HCT 32.1 (L) 11/20/2016   PLT 299 11/20/2016   GLUCOSE 79 11/20/2016   NA 139 11/20/2016   K 4.7 11/20/2016   CL 102 11/20/2016   CREATININE 1.35 (H) 11/20/2016   BUN 21 11/20/2016   CO2 23 11/20/2016   TSH 0.90 06/13/2011   INR 1.1 11/20/2016   Labs dated 07/15/15: cholesterol 229,  triglycerides 111, HDL 76, LDL 131.  Dated 07/19/16: cholesterol 247, triglycerides 71, HDL 88, LDL 145. BUN 28, creatinine 1.3. Other chemistries, CBC and TSH normal.  Echo: 08/18/15 Study Conclusions  - Left ventricle: The cavity size was normal. Wall thickness was  normal. Systolic function was normal. The estimated ejection  fraction was in the range of 55% to 60%. Wall motion was normal;  there were no regional wall motion abnormalities. Features are  consistent with a pseudonormal left ventricular filling pattern,  with concomitant abnormal relaxation and increased filling  pressure (grade 2 diastolic dysfunction). - Aortic valve: There was moderate stenosis. There was mild to  moderate regurgitation directed centrally in the LVOT. - Mitral valve: There was mild regurgitation directed centrally. - Tricuspid valve: There was mild-moderate regurgitation directed  centrally. - Pulmonary arteries: Systolic pressure was mildly increased. PA  peak pressure: 34 mm Hg (S).  Echo 06/20/16: Study Conclusions  - Left ventricle: The cavity size was normal. There was mild   concentric hypertrophy. Systolic function was normal. The   estimated ejection fraction was in the range of 55% to 60%. Wall   motion was normal; there were no regional wall motion   abnormalities. - Aortic valve: Valve mobility was restricted. There was moderate   stenosis. There was moderate regurgitation. Peak velocity (S):   370 cm/s. Mean gradient (S): 30 mm Hg. Regurgitation pressure   half-time: 449 ms. - Mitral valve: Transvalvular velocity was within the normal range.   There was no evidence for stenosis. There was mild regurgitation. - Left atrium: The atrium was moderately dilated. - Right ventricle: The cavity size was normal. Wall thickness was   normal. Systolic function was normal. - Tricuspid valve: There was moderate regurgitation. - Pulmonary arteries: Systolic pressure was within the  normal   range. PA peak pressure: 36 mm Hg (S).  Impressions:  - No significant changes since 08/2015.  Labs from primary care dated 07/14/15: Normal CMET and CBC. Cholesterol  229, trig 111, HDL 76, LDL 131. TSH 1.98.   Myoview 10/03/16: Study Highlights     The left ventricular ejection fraction is normal (55-65%).  Nuclear stress EF: 59%.  There was no ST segment deviation noted during stress.  The study is normal.   1. EF 59%, normal wall motion.  2. No evidence for ischemia or infarction by perfusion images.   Normal study.     Procedures   RIGHT/LEFT HEART CATH AND CORONARY ANGIOGRAPHY  Conclusion     Mid LAD lesion, 30 %stenosed.  Mid LAD to Dist LAD lesion, 85 %stenosed.  Prox RCA to Mid RCA lesion, 20 %stenosed.  Dist RCA lesion, 30 %stenosed.  Ost LM lesion, 10 %stenosed.  LV end diastolic pressure is normal.  LV end diastolic pressure is normal.   1. Single vessel obstructive CAD with 85% mid LAD stenosis 2. Severe aortic stenosis. Mean gradient of 40 mm Hg. AVA 0.61 cm squared with index .37. 3. Normal cardiac output. 4. Normal right heart and LV filling pressures  Plan: will discuss options for AVR. I think her best option based on age and co-morbidities would be TAVR with stenting of mid LAD. Will discuss with heart valve team.   Indications   Nonrheumatic aortic valve stenosis [I35.0 (ICD-10-CM)]  Procedural Details/Technique   Technical Details Indication: 83 yo WF with progressive dyspnea on exertion and aortic stenosis  Procedural Details: The right wrist was prepped, draped, and anesthetized with 1% lidocaine. Using the modified Seldinger technique a 6 Fr slender sheath was placed in the right radial artery and a 5 French sheath was placed in the right brachial vein. We were unable to pass the Swan- Ganz catheter from the right brachial approach due to inability to pass catheter past the old pacing wires. The right groin was prepped,  draped and anesthetized with 1% lidocaine. A 7 Fr sheath was placed percutaneously in the right femoral vein. A Swan-Ganz catheter was used for the right heart catheterization. Standard protocol was followed for recording of right heart pressures and sampling of oxygen saturations. Fick cardiac output was calculated. Standard Judkins catheters were used for selective coronary angiography and left ventricular pressures. There were no immediate procedural complications. The patient was transferred to the post catheterization recovery area for further monitoring.  Contrast: 55 cc   Estimated blood loss <50 mL.  During this procedure the patient was administered the following to achieve and maintain moderate conscious sedation: Versed 1 mg, Fentanyl 25 mcg, while the patient's heart rate, blood pressure, and oxygen saturation were continuously monitored. The period of conscious sedation was 50 minutes, of which I was present face-to-face 100% of this time.    Complications   Complications documented before study signed (11/27/2016 11:04 AM EDT)    No complications were associated with this study.  Documented by Martinique, Kahlen Morais M, MD - 11/27/2016 11:04 AM EDT    Coronary Findings   Dominance: Right  Left Main  Ost LM lesion, 10% stenosed. The lesion is moderately calcified.  Left Anterior Descending  Mid LAD lesion, 30% stenosed. The lesion is moderately calcified.  Mid LAD to Dist LAD lesion, 85% stenosed.  Left Circumflex  Vessel is small.  Right Coronary Artery  Prox RCA to Mid RCA lesion, 20% stenosed.  Dist RCA lesion, 30% stenosed.  Right Heart   Right Heart Pressures LV EDP is normal.    Left Heart   Left Ventricle LV end diastolic pressure is normal.    Coronary  Diagrams   Diagnostic Diagram       Implants     No implant documentation for this case.  MERGE Images   Show images for CARDIAC CATHETERIZATION   Link to Procedure Log   Procedure Log    Hemo Data     Most Recent Value  Fick Cardiac Output 3.62 L/min  Fick Cardiac Output Index 2.22 (L/min)/BSA  Aortic Mean Gradient 39.6 mmHg  Aortic Peak Gradient 38 mmHg  Aortic Valve Area 0.61  Aortic Value Area Index 0.37 cm2/BSA  RA A Wave 9 mmHg  RA V Wave 9 mmHg  RA Mean 6 mmHg  RV Systolic Pressure 34 mmHg  RV Diastolic Pressure 3 mmHg  RV EDP 10 mmHg  PA Systolic Pressure 32 mmHg  PA Diastolic Pressure 11 mmHg  PA Mean 20 mmHg  PW A Wave 18 mmHg  PW V Wave 23 mmHg  PW Mean 16 mmHg  AO Systolic Pressure 026 mmHg  AO Diastolic Pressure 71 mmHg  AO Mean 378 mmHg  LV Systolic Pressure 588 mmHg  LV Diastolic Pressure 5 mmHg  LV EDP 21 mmHg  Arterial Occlusion Pressure Extended Systolic Pressure 502 mmHg  Arterial Occlusion Pressure Extended Diastolic Pressure 67 mmHg  Arterial Occlusion Pressure Extended Mean Pressure 103 mmHg  Left Ventricular Apex Extended Systolic Pressure 774 mmHg  Left Ventricular Apex Extended Diastolic Pressure 3 mmHg  Left Ventricular Apex Extended EDP Pressure 22 mmHg  QP/QS 1  TPVR Index 9 HRUI  TSVR Index 48.14 HRUI  PVR SVR Ratio 0.04  TPVR/TSVR Ratio 0.19     Assessment / Plan: 1. Atrial fibrillation with sick sinus syndrome. Prior history of  persistent AFib.  Now on amiodarone since November- converted to NSR and no recurrence.   Continue long-term anticoagulation. Will switch Xarelto to Eliquis 2.5 mg bid.  Dose adjusted for age and renal function. S/p pacemaker replacement for failed RA lead.   2. Aortic stenosis- moderate by Echo in April but by cardiac cath data is now severe. I suspect this is the primary cause of her dyspnea on exertion. Risk for open heart surgery is at least intermediate given advanced age and comorbidities. Recommend TAVR. Will refer to valve team.  3. CAD with significant single vessel obstructive disease in the mid LAD. Would recommend PCI of the LAD first then referral for TAVR. Would treat with Plavix. Switch Xarelto to  Eliquis. Try and minimize time on ASA. Will schedule procedure for this Thursday October 18. The procedure and risks were reviewed including but not limited to death, myocardial infarction, stroke, arrythmias, bleeding, transfusion, emergency surgery, dye allergy, or renal dysfunction. The patient voices understanding and is agreeable to proceed.   4. HTN- controlled  5. Hyperlipidemia. Since she does have CAD will add Crestor 10 mg daily.

## 2016-12-17 ENCOUNTER — Ambulatory Visit (INDEPENDENT_AMBULATORY_CARE_PROVIDER_SITE_OTHER): Payer: Medicare Other | Admitting: Cardiology

## 2016-12-17 ENCOUNTER — Other Ambulatory Visit: Payer: Self-pay | Admitting: Cardiology

## 2016-12-17 ENCOUNTER — Encounter: Payer: Self-pay | Admitting: Cardiology

## 2016-12-17 VITALS — BP 143/82 | HR 97 | Ht 66.0 in | Wt 128.2 lb

## 2016-12-17 DIAGNOSIS — I350 Nonrheumatic aortic (valve) stenosis: Secondary | ICD-10-CM

## 2016-12-17 DIAGNOSIS — Z950 Presence of cardiac pacemaker: Secondary | ICD-10-CM

## 2016-12-17 DIAGNOSIS — I480 Paroxysmal atrial fibrillation: Secondary | ICD-10-CM

## 2016-12-17 DIAGNOSIS — I25118 Atherosclerotic heart disease of native coronary artery with other forms of angina pectoris: Secondary | ICD-10-CM

## 2016-12-17 DIAGNOSIS — I209 Angina pectoris, unspecified: Secondary | ICD-10-CM | POA: Diagnosis not present

## 2016-12-17 LAB — CBC WITH DIFFERENTIAL/PLATELET
Basophils Absolute: 0 10*3/uL (ref 0.0–0.2)
Basos: 1 %
EOS (ABSOLUTE): 0.1 10*3/uL (ref 0.0–0.4)
EOS: 2 %
Hematocrit: 32 % — ABNORMAL LOW (ref 34.0–46.6)
Hemoglobin: 10.1 g/dL — ABNORMAL LOW (ref 11.1–15.9)
Immature Grans (Abs): 0 10*3/uL (ref 0.0–0.1)
Immature Granulocytes: 0 %
Lymphocytes Absolute: 0.9 10*3/uL (ref 0.7–3.1)
Lymphs: 17 %
MCH: 27.3 pg (ref 26.6–33.0)
MCHC: 31.6 g/dL (ref 31.5–35.7)
MCV: 87 fL (ref 79–97)
MONOCYTES: 8 %
MONOS ABS: 0.4 10*3/uL (ref 0.1–0.9)
Neutrophils Absolute: 3.6 10*3/uL (ref 1.4–7.0)
Neutrophils: 72 %
PLATELETS: 309 10*3/uL (ref 150–379)
RBC: 3.7 x10E6/uL — ABNORMAL LOW (ref 3.77–5.28)
RDW: 15.5 % — ABNORMAL HIGH (ref 12.3–15.4)
WBC: 5 10*3/uL (ref 3.4–10.8)

## 2016-12-17 LAB — BASIC METABOLIC PANEL
BUN/Creatinine Ratio: 17 (ref 12–28)
BUN: 27 mg/dL (ref 8–27)
CHLORIDE: 98 mmol/L (ref 96–106)
CO2: 25 mmol/L (ref 20–29)
CREATININE: 1.59 mg/dL — AB (ref 0.57–1.00)
Calcium: 9.8 mg/dL (ref 8.7–10.3)
GFR calc Af Amer: 34 mL/min/{1.73_m2} — ABNORMAL LOW (ref >59)
GFR calc non Af Amer: 29 mL/min/{1.73_m2} — ABNORMAL LOW (ref >59)
GLUCOSE: 78 mg/dL (ref 65–99)
Potassium: 5.5 mmol/L — ABNORMAL HIGH (ref 3.5–5.2)
Sodium: 137 mmol/L (ref 134–144)

## 2016-12-17 LAB — PROTIME-INR
INR: 1.2 (ref 0.8–1.2)
PROTHROMBIN TIME: 12.4 s — AB (ref 9.1–12.0)

## 2016-12-17 MED ORDER — APIXABAN 2.5 MG PO TABS: 2.5000 mg | ORAL_TABLET | Freq: Two times a day (BID) | ORAL | 11 refills | Status: DC

## 2016-12-17 MED ORDER — ROSUVASTATIN CALCIUM 10 MG PO TABS: 10.0000 mg | ORAL_TABLET | Freq: Every day | ORAL | 3 refills | Status: DC

## 2016-12-17 MED ORDER — CLOPIDOGREL BISULFATE 75 MG PO TABS: 75.0000 mg | ORAL_TABLET | Freq: Every day | ORAL | 3 refills | Status: DC

## 2016-12-17 NOTE — Addendum Note (Signed)
Addended by: Kathyrn Lass on: 12/17/2016 09:18 AM   Modules accepted: Orders

## 2016-12-17 NOTE — Patient Instructions (Addendum)
Stop taking Xarelto  We will start  Eliquis 2.5 mg twice a day after your stent procedure.   Start Plavix 75 mg daily  Start ASA 81 mg dailly  Add Crestor 10 mg daily for cholesterol   We will schedule you for a coronary stent procedure.     [place] [address] [place] [address] Dept: [phone] Loc: [phone]  [name] [name]  12/17/2016  You are scheduled for a Cardiac Cath on Thursday 12/20/16, with [name].  1. Please arrive at the [facility] (Main Entrance A) at [facility]: [address] [place], [place] [address] at 10:00 am (two hours before your procedure to ensure your preparation). Free valet parking service is available.   Special note: Every effort is made to have your procedure done on time. Please understand that emergencies sometimes delay scheduled procedures.  2. Diet: Nothing to eat or drink after midnight Wed night 12/19/16  3. Labs: ( bmet,cbc,pt ) Mon 12/17/16  4. Medication instructions in preparation for your procedure:      Stop Xarelto today 12/17/16            Take Plavix and Aspirin morning of cath with a sip of water.       Hold Furosemide morning of cath       You may take other morning medications with a sip of water  5. Plan for one night stay--bring personal belongings. 6. Bring a current list of your medications and current insurance cards. 7. You MUST have a responsible person to drive you home. 8. Someone MUST be with you the first 24 hours after you arrive home or your discharge will be delayed. 9. Please wear clothes that are easy to get on and off and wear slip-on shoes.  Thank you for allowing [place] to care for you!   -- [facility]

## 2016-12-18 ENCOUNTER — Other Ambulatory Visit: Payer: Self-pay

## 2016-12-18 ENCOUNTER — Other Ambulatory Visit: Payer: Self-pay | Admitting: Cardiology

## 2016-12-20 ENCOUNTER — Other Ambulatory Visit: Payer: Self-pay

## 2016-12-20 ENCOUNTER — Ambulatory Visit (HOSPITAL_COMMUNITY)
Admission: RE | Disposition: A | Discharge status: Home / Self Care | Payer: Self-pay | Source: Ambulatory Visit | Attending: Cardiology

## 2016-12-20 ENCOUNTER — Encounter (HOSPITAL_COMMUNITY): Payer: Self-pay | Admitting: Cardiology

## 2016-12-20 ENCOUNTER — Ambulatory Visit (HOSPITAL_COMMUNITY)
Admission: RE | Admit: 2016-12-20 | Discharge: 2016-12-21 | Disposition: A | Discharge status: Home / Self Care | Payer: Medicare Other | Source: Ambulatory Visit | Attending: Cardiology | Admitting: Cardiology

## 2016-12-20 DIAGNOSIS — Z950 Presence of cardiac pacemaker: Secondary | ICD-10-CM | POA: Diagnosis not present

## 2016-12-20 DIAGNOSIS — I2510 Atherosclerotic heart disease of native coronary artery without angina pectoris: Secondary | ICD-10-CM | POA: Diagnosis not present

## 2016-12-20 DIAGNOSIS — R0609 Other forms of dyspnea: Secondary | ICD-10-CM | POA: Diagnosis present

## 2016-12-20 DIAGNOSIS — E785 Hyperlipidemia, unspecified: Secondary | ICD-10-CM | POA: Diagnosis present

## 2016-12-20 DIAGNOSIS — K219 Gastro-esophageal reflux disease without esophagitis: Secondary | ICD-10-CM | POA: Diagnosis not present

## 2016-12-20 DIAGNOSIS — E039 Hypothyroidism, unspecified: Secondary | ICD-10-CM | POA: Insufficient documentation

## 2016-12-20 DIAGNOSIS — I481 Persistent atrial fibrillation: Secondary | ICD-10-CM | POA: Insufficient documentation

## 2016-12-20 DIAGNOSIS — I495 Sick sinus syndrome: Secondary | ICD-10-CM | POA: Diagnosis present

## 2016-12-20 DIAGNOSIS — I25118 Atherosclerotic heart disease of native coronary artery with other forms of angina pectoris: Secondary | ICD-10-CM

## 2016-12-20 DIAGNOSIS — I4891 Unspecified atrial fibrillation: Secondary | ICD-10-CM | POA: Diagnosis present

## 2016-12-20 DIAGNOSIS — M1990 Unspecified osteoarthritis, unspecified site: Secondary | ICD-10-CM | POA: Insufficient documentation

## 2016-12-20 DIAGNOSIS — Z880 Allergy status to penicillin: Secondary | ICD-10-CM | POA: Diagnosis not present

## 2016-12-20 DIAGNOSIS — I2584 Coronary atherosclerosis due to calcified coronary lesion: Secondary | ICD-10-CM | POA: Insufficient documentation

## 2016-12-20 DIAGNOSIS — N189 Chronic kidney disease, unspecified: Secondary | ICD-10-CM | POA: Diagnosis not present

## 2016-12-20 DIAGNOSIS — Z7901 Long term (current) use of anticoagulants: Secondary | ICD-10-CM

## 2016-12-20 DIAGNOSIS — I129 Hypertensive chronic kidney disease with stage 1 through stage 4 chronic kidney disease, or unspecified chronic kidney disease: Secondary | ICD-10-CM | POA: Insufficient documentation

## 2016-12-20 DIAGNOSIS — Z87891 Personal history of nicotine dependence: Secondary | ICD-10-CM | POA: Diagnosis not present

## 2016-12-20 DIAGNOSIS — I350 Nonrheumatic aortic (valve) stenosis: Secondary | ICD-10-CM | POA: Diagnosis not present

## 2016-12-20 DIAGNOSIS — I10 Essential (primary) hypertension: Secondary | ICD-10-CM | POA: Diagnosis present

## 2016-12-20 HISTORY — DX: Atherosclerotic heart disease of native coronary artery without angina pectoris: I25.10

## 2016-12-20 HISTORY — PX: CORONARY ANGIOPLASTY WITH STENT PLACEMENT: SHX49

## 2016-12-20 HISTORY — PX: CORONARY STENT INTERVENTION: CATH118234

## 2016-12-20 HISTORY — DX: Chronic kidney disease, unspecified: N18.9

## 2016-12-20 HISTORY — DX: Nonrheumatic aortic (valve) stenosis: I35.0

## 2016-12-20 LAB — BASIC METABOLIC PANEL
ANION GAP: 9 (ref 5–15)
BUN: 20 mg/dL (ref 6–20)
CO2: 27 mmol/L (ref 22–32)
Calcium: 9.6 mg/dL (ref 8.9–10.3)
Chloride: 97 mmol/L — ABNORMAL LOW (ref 101–111)
Creatinine, Ser: 1.4 mg/dL — ABNORMAL HIGH (ref 0.44–1.00)
GFR, EST AFRICAN AMERICAN: 39 mL/min — AB (ref >60)
GFR, EST NON AFRICAN AMERICAN: 33 mL/min — AB (ref >60)
Glucose, Bld: 101 mg/dL — ABNORMAL HIGH (ref 65–99)
POTASSIUM: 4.8 mmol/L (ref 3.5–5.1)
SODIUM: 133 mmol/L — AB (ref 135–145)

## 2016-12-20 LAB — POCT ACTIVATED CLOTTING TIME: ACTIVATED CLOTTING TIME: 285 s

## 2016-12-20 SURGERY — CORONARY STENT INTERVENTION
Anesthesia: LOCAL

## 2016-12-20 MED ORDER — METOPROLOL TARTRATE 25 MG PO TABS
50.0000 mg | ORAL_TABLET | Freq: Two times a day (BID) | ORAL | Status: DC
  Administered 2016-12-20 – 2016-12-21 (×3): 50 mg via ORAL
  Filled 2016-12-20 (×3): qty 2

## 2016-12-20 MED ORDER — ANGIOPLASTY BOOK
Freq: Once | Status: AC
Start: 2016-12-20 — End: 2016-12-20
  Administered 2016-12-20: 16:00:00
  Filled 2016-12-20: qty 1

## 2016-12-20 MED ORDER — MIDAZOLAM HCL 2 MG/2ML IJ SOLN
INTRAMUSCULAR | Status: DC | PRN
  Administered 2016-12-20: 1 mg via INTRAVENOUS

## 2016-12-20 MED ORDER — LEVOTHYROXINE SODIUM 100 MCG PO TABS
100.0000 ug | ORAL_TABLET | Freq: Every day | ORAL | Status: DC
  Administered 2016-12-21: 100 ug via ORAL
  Filled 2016-12-20: qty 1

## 2016-12-20 MED ORDER — SODIUM CHLORIDE 0.9% FLUSH: 3.0000 mL | INTRAVENOUS | Status: DC | PRN

## 2016-12-20 MED ORDER — FENTANYL CITRATE (PF) 100 MCG/2ML IJ SOLN
INTRAMUSCULAR | Status: DC | PRN
  Administered 2016-12-20: 25 ug via INTRAVENOUS

## 2016-12-20 MED ORDER — CLOPIDOGREL BISULFATE 75 MG PO TABS
75.0000 mg | ORAL_TABLET | Freq: Every day | ORAL | Status: DC
  Administered 2016-12-21: 75 mg via ORAL
  Filled 2016-12-20: qty 1

## 2016-12-20 MED ORDER — SODIUM CHLORIDE 0.9 % IV SOLN
INTRAVENOUS | Status: DC
  Administered 2016-12-20: 10:00:00 via INTRAVENOUS

## 2016-12-20 MED ORDER — MIDAZOLAM HCL 2 MG/2ML IJ SOLN
INTRAMUSCULAR | Status: AC
  Filled 2016-12-20: qty 2

## 2016-12-20 MED ORDER — PROSIGHT PO TABS
1.0000 | ORAL_TABLET | Freq: Two times a day (BID) | ORAL | Status: DC
  Administered 2016-12-20 – 2016-12-21 (×3): 1 via ORAL
  Filled 2016-12-20 (×3): qty 1

## 2016-12-20 MED ORDER — VERAPAMIL HCL 2.5 MG/ML IV SOLN
INTRAVENOUS | Status: AC
  Filled 2016-12-20: qty 2

## 2016-12-20 MED ORDER — LORATADINE 10 MG PO TABS: 10.0000 mg | ORAL_TABLET | Freq: Every day | ORAL | Status: DC | PRN

## 2016-12-20 MED ORDER — POLYVINYL ALCOHOL 1.4 % OP SOLN: 1.0000 [drp] | Freq: Three times a day (TID) | OPHTHALMIC | Status: DC | PRN

## 2016-12-20 MED ORDER — ASPIRIN EC 81 MG PO TBEC
81.0000 mg | DELAYED_RELEASE_TABLET | Freq: Every day | ORAL | Status: DC
  Administered 2016-12-21: 10:00:00 81 mg via ORAL
  Filled 2016-12-20: qty 1

## 2016-12-20 MED ORDER — HEPARIN SODIUM (PORCINE) 1000 UNIT/ML IJ SOLN
INTRAMUSCULAR | Status: DC | PRN
  Administered 2016-12-20: 6000 [IU] via INTRAVENOUS

## 2016-12-20 MED ORDER — IOPAMIDOL (ISOVUE-370) INJECTION 76%
INTRAVENOUS | Status: AC
  Filled 2016-12-20: qty 100

## 2016-12-20 MED ORDER — VERAPAMIL HCL 2.5 MG/ML IV SOLN
INTRAVENOUS | Status: DC | PRN
  Administered 2016-12-20: 12:00:00 via INTRA_ARTERIAL

## 2016-12-20 MED ORDER — IOPAMIDOL (ISOVUE-370) INJECTION 76%
INTRAVENOUS | Status: DC | PRN
  Administered 2016-12-20: 80 mL via INTRA_ARTERIAL

## 2016-12-20 MED ORDER — SODIUM CHLORIDE 0.9 % WEIGHT BASED INFUSION
1.0000 mL/kg/h | INTRAVENOUS | Status: AC
  Administered 2016-12-20: 1 mL/kg/h via INTRAVENOUS

## 2016-12-20 MED ORDER — LIDOCAINE HCL 2 % IJ SOLN
INTRAMUSCULAR | Status: AC
  Filled 2016-12-20: qty 10

## 2016-12-20 MED ORDER — AMLODIPINE BESYLATE 5 MG PO TABS
2.5000 mg | ORAL_TABLET | Freq: Every day | ORAL | Status: DC
  Administered 2016-12-20 – 2016-12-21 (×2): 2.5 mg via ORAL
  Filled 2016-12-20 (×2): qty 1

## 2016-12-20 MED ORDER — CALCIUM CARBONATE 1250 (500 CA) MG PO TABS
1250.0000 mg | ORAL_TABLET | Freq: Every evening | ORAL | Status: DC
  Administered 2016-12-20: 17:00:00 1250 mg via ORAL
  Filled 2016-12-20: qty 1

## 2016-12-20 MED ORDER — LIDOCAINE HCL (PF) 1 % IJ SOLN
INTRAMUSCULAR | Status: DC | PRN
  Administered 2016-12-20: 2 mL via INTRADERMAL

## 2016-12-20 MED ORDER — NITROGLYCERIN 1 MG/10 ML FOR IR/CATH LAB
INTRA_ARTERIAL | Status: AC
  Filled 2016-12-20: qty 10

## 2016-12-20 MED ORDER — AMIODARONE HCL 200 MG PO TABS
100.0000 mg | ORAL_TABLET | Freq: Every day | ORAL | Status: DC
  Administered 2016-12-21: 100 mg via ORAL
  Filled 2016-12-20: qty 1

## 2016-12-20 MED ORDER — HEPARIN SODIUM (PORCINE) 1000 UNIT/ML IJ SOLN
INTRAMUSCULAR | Status: AC
  Filled 2016-12-20: qty 1

## 2016-12-20 MED ORDER — SODIUM CHLORIDE 0.9 % IV SOLN: 250.0000 mL | INTRAVENOUS | Status: DC | PRN

## 2016-12-20 MED ORDER — DICYCLOMINE HCL 10 MG PO CAPS: 10.0000 mg | ORAL_CAPSULE | Freq: Two times a day (BID) | ORAL | Status: DC | PRN

## 2016-12-20 MED ORDER — VITAMIN D 1000 UNITS PO TABS
1000.0000 [IU] | ORAL_TABLET | Freq: Every day | ORAL | Status: DC
  Administered 2016-12-20 – 2016-12-21 (×2): 1000 [IU] via ORAL
  Filled 2016-12-20 (×3): qty 1

## 2016-12-20 MED ORDER — HYDRALAZINE HCL 20 MG/ML IJ SOLN
5.0000 mg | INTRAMUSCULAR | Status: AC | PRN
  Administered 2016-12-20: 15:00:00 5 mg via INTRAVENOUS
  Filled 2016-12-20: qty 1

## 2016-12-20 MED ORDER — FENTANYL CITRATE (PF) 100 MCG/2ML IJ SOLN
INTRAMUSCULAR | Status: AC
  Filled 2016-12-20: qty 2

## 2016-12-20 MED ORDER — ROSUVASTATIN CALCIUM 10 MG PO TABS
10.0000 mg | ORAL_TABLET | Freq: Every day | ORAL | Status: DC
  Administered 2016-12-21: 10 mg via ORAL
  Filled 2016-12-20: qty 1

## 2016-12-20 MED ORDER — HEPARIN (PORCINE) IN NACL 2-0.9 UNIT/ML-% IJ SOLN
INTRAMUSCULAR | Status: AC
  Filled 2016-12-20: qty 1000

## 2016-12-20 MED ORDER — HEPARIN (PORCINE) IN NACL 2-0.9 UNIT/ML-% IJ SOLN
INTRAMUSCULAR | Status: AC | PRN
  Administered 2016-12-20: 1000 mL via INTRA_ARTERIAL

## 2016-12-20 MED ORDER — OCUVITE-LUTEIN PO CAPS
1.0000 | ORAL_CAPSULE | Freq: Two times a day (BID) | ORAL | Status: DC
  Filled 2016-12-20: qty 1

## 2016-12-20 MED ORDER — ASPIRIN 81 MG PO CHEW: 81.0000 mg | CHEWABLE_TABLET | ORAL | Status: DC

## 2016-12-20 MED ORDER — ACETAMINOPHEN 325 MG PO TABS: 650.0000 mg | ORAL_TABLET | ORAL | Status: DC | PRN

## 2016-12-20 MED ORDER — SODIUM CHLORIDE 0.9% FLUSH
3.0000 mL | Freq: Two times a day (BID) | INTRAVENOUS | Status: DC
  Administered 2016-12-20 – 2016-12-21 (×2): 3 mL via INTRAVENOUS

## 2016-12-20 MED ORDER — SALINE SPRAY 0.65 % NA SOLN: 1.0000 | NASAL | Status: DC | PRN

## 2016-12-20 MED ORDER — SODIUM CHLORIDE 0.9% FLUSH: 3.0000 mL | Freq: Two times a day (BID) | INTRAVENOUS | Status: DC

## 2016-12-20 MED ORDER — ONDANSETRON HCL 4 MG/2ML IJ SOLN: 4.0000 mg | Freq: Four times a day (QID) | INTRAMUSCULAR | Status: DC | PRN

## 2016-12-20 SURGICAL SUPPLY — 15 items
BALLN SAPPHIRE 2.5X12 (BALLOONS) ×2
BALLN SAPPHIRE ~~LOC~~ 2.75X10 (BALLOONS) ×1 IMPLANT
BALLOON SAPPHIRE 2.5X12 (BALLOONS) IMPLANT
CATH VISTA GUIDE 6FR XBLAD3.5 (CATHETERS) ×1 IMPLANT
DEVICE RAD COMP TR BAND LRG (VASCULAR PRODUCTS) ×1 IMPLANT
GLIDESHEATH SLEND SS 6F .021 (SHEATH) ×1 IMPLANT
GUIDEWIRE INQWIRE 1.5J.035X260 (WIRE) IMPLANT
INQWIRE 1.5J .035X260CM (WIRE) ×2
KIT ENCORE 26 ADVANTAGE (KITS) ×1 IMPLANT
KIT HEART LEFT (KITS) ×2 IMPLANT
PACK CARDIAC CATHETERIZATION (CUSTOM PROCEDURE TRAY) ×2 IMPLANT
STENT PROMUS PREM MR 2.75X16 (Permanent Stent) ×1 IMPLANT
TRANSDUCER W/STOPCOCK (MISCELLANEOUS) ×2 IMPLANT
TUBING CIL FLEX 10 FLL-RA (TUBING) ×2 IMPLANT
WIRE ASAHI PROWATER 180CM (WIRE) ×2 IMPLANT

## 2016-12-20 NOTE — Progress Notes (Signed)
TR BAND REMOVAL  LOCATION:    right radial  DEFLATED PER PROTOCOL:    Yes.    TIME BAND OFF / DRESSING APPLIED:    1830  SITE UPON ARRIVAL:    Level 1  SITE AFTER BAND REMOVAL:    1  CIRCULATION SENSATION AND MOVEMENT:    Within Normal Limits   Yes.    COMMENTS:   Area is bruised

## 2016-12-20 NOTE — Interval H&P Note (Signed)
History and Physical Interval Note:  12/20/2016 11:05 AM  [name] [name]  has presented today for surgery, with the diagnosis of cad  The various methods of treatment have been discussed with the patient and family. After consideration of risks, benefits and other options for treatment, the patient has consented to  Procedure(s): CORONARY STENT INTERVENTION (N/A) as a surgical intervention .  The patient's history has been reviewed, patient examined, no change in status, stable for surgery.  I have reviewed the patient's chart and labs.  Questions were answered to the patient's satisfaction.    Cath Lab Visit (complete for each Cath Lab visit)  Clinical Evaluation Leading to the Procedure:   ACS: No.  Non-ACS:    Anginal Classification: CCS III  Anti-ischemic medical therapy: Minimal Therapy (1 class of medications)  Non-Invasive Test Results: No non-invasive testing performed  Prior CABG: No previous CABG       Collier [facility] 12/20/2016 11:05 AM

## 2016-12-20 NOTE — H&P (View-Only) (Signed)
[name] Date of Birth: 04-23-1929 Medical Record #[id number]  History of Present Illness: Ms. Narvaiz is seen today for follow up s/p recent cardiac cath.  She has a history of paroxysmal atrial fibrillation with tachy-brady syndrome and is s/p pacemaker implant. She is on chronic anticoagulation with Xarelto. Echo in July 2015 showed moderate aortic stenosis that had progressed since 2012. Repeat in July 2016 and July 2017 showed no change. In April 2017 she had atrial fibrillation that converted spontaneously. This was felt to be triggered by a steroid injection. Her metoprolol was increased at that time. When seen in June 2017 she was noted to be in persistent Afib. We placed her on Flecainide but this was later stopped due to side effects. She was seen in November with recurrent and persistent Afib. Rate was controlled but she was symptomatic. Seen in Afib clinic and started on amiodarone with subsequent conversion to NSR. Ecg in January showed NSR.  When last see she complained  of  SOB with activity- this was present even before amiodarone and hasn't improved with restoration of NSR.  She states that any walking or using her arms makes her SOB. No edema.  She does describe a sinking feeling and discomfort  in her epigastrium that makes her feel very weak. She did have a upper and lower EGD which was negative except for few polyps. She had PFTs in December that showed mild obstructive defect with marked decrease in diffusion capacity. CT of the Abdomen was negative for any pathology. She reports Dr. Virgina Jock did an [place] as well. Her last Echo was in April as noted below. Myoview study was normal. When seen by Dr. [name] in late July it was noted that she had failure of her RA lead. Her device was reprogrammed but she developed pectoral stimulation. She underwent explantation of her old generator and placement of a new left chest dual chamber pacemaker on August 7. Because of her ongoing symptoms we  recommended a right and left heart cath and this was done on 11/27/16. This demonstrated severe AS as well as a severe stenosis in the mid LAD. She is seen to review and discuss treatment options.   On follow up she is doing ok.  She has no complications from her cardiac cath. She continues to complain of dyspnea on exertion. This is quite limiting for her.   Current Outpatient Prescriptions on File Prior to Visit  Medication Sig Dispense Refill  . acetaminophen (TYLENOL) 500 MG tablet Take 500 mg by mouth every 6 (six) hours as needed (for pain.).    Marland Kitchen amiodarone (PACERONE) 200 MG tablet Take 1/2 tablet (100 mg) by mouth daily (Patient taking differently: Take 100 mg by mouth daily. ) 90 tablet 3  . calcium carbonate (OS-CAL) 600 MG TABS Take 600 mg by mouth every evening.     . Cholecalciferol (VITAMIN D) 1000 UNITS capsule Take 1,000 Units by mouth daily.      Marland Kitchen dicyclomine (BENTYL) 10 MG capsule Take 10 mg by mouth 2 (two) times daily as needed for spasms.   1  . esomeprazole (NEXIUM) 40 MG capsule Take 40 mg by mouth daily as needed (for acid reflux/indigestion.).     Marland Kitchen furosemide (LASIX) 20 MG tablet Take 1 tablet (20 mg total) by mouth daily as needed (weight gain). (Patient taking differently: Take 20 mg by mouth daily as needed (for fluid retention/weight gain.). ) 30 tablet 6  . levothyroxine (SYNTHROID, LEVOTHROID) 100 MCG tablet  Take 100 mcg by mouth daily before breakfast.     . lisinopril (PRINIVIL,ZESTRIL) 5 MG tablet Take 1 tablet (5 mg total) by mouth daily. 60 tablet 11  . metoprolol tartrate (LOPRESSOR) 50 MG tablet Take 50 mg by mouth 2 (two) times daily.    . Multiple Vitamins-Minerals (ICAPS AREDS 2) CAPS Take 1 capsule by mouth 2 (two) times daily.    . polyvinyl alcohol (LIQUIFILM TEARS) 1.4 % ophthalmic solution Place 1-2 drops into both eyes 3 (three) times daily as needed for dry eyes.     No current facility-administered medications on file prior to visit.      Allergies  Allergen Reactions  . Penicillins Anaphylaxis    Has patient had a PCN reaction causing immediate rash, facial/tongue/throat swelling, SOB or lightheadedness with hypotension: Yes Has patient had a PCN reaction causing severe rash involving mucus membranes or skin necrosis: No Has patient had a PCN reaction that required hospitalization: No Has patient had a PCN reaction occurring within the last 10 years: No If all of the above answers are "NO", then may proceed with Cephalosporin use.   . Tetanus Toxoid Anaphylaxis  . Demerol Other (See Comments)    Severe nausea  . Codeine Nausea Only    Severe nausea    Past Medical History:  Diagnosis Date  . Aortic stenosis    mild  . Aortic stenosis   . Arthritis    "back" (10/10/2016)  . Basal cell carcinoma    "legs, arms, face" (10/10/2016)  . Diverticulosis   . Family history of adverse reaction to anesthesia    "sister gets PONV"  . GERD (gastroesophageal reflux disease)   . Heart murmur   . Hemorrhoids   . History of blood transfusion 1980   "think I had one when I had my hysterectomy"  . Hyperlipidemia   . Hypertension   . Hypothyroidism   . Osteoarthritis    "knees, hands" (10/10/2016)  . Paroxysmal atrial fibrillation (HCC)   . PONV (postoperative nausea and vomiting)   . Sick sinus syndrome (HCC)    s/p PPM (MDT)  . Squamous carcinoma    "legs, arms, face" (10/10/2016)  . Syncope     Past Surgical History:  Procedure Laterality Date  . ABDOMINAL HYSTERECTOMY  1980  . BASAL CELL CARCINOMA EXCISION    . CATARACT EXTRACTION W/ INTRAOCULAR LENS  IMPLANT, BILATERAL Bilateral   . FINGER SURGERY Left    "fell; developed skiers thumb; had to operate on it"  . INSERT / REPLACE / [place]   for syptomatic bradycardia and syncope -- in [facility]  . INSERT / REPLACE / REMOVE PACEMAKER  2001   pulse generator replacement by Dr. Rollene Fare   . INSERT / REPLACE / REMOVE PACEMAKER  04/01/2008   PPM  [employer] -- model # [id number] serial # [id number] -- pulse generator replacement by Dr. Verlon Setting   . LEAD REVISION/REPAIR N/A 10/09/2016   Procedure: Lead Revision/Repair;  Surgeon: Thompson Grayer, MD;  Location: [place] [facility];  Service: Cardiovascular;  Laterality: N/A;  . RIGHT/LEFT HEART CATH AND CORONARY ANGIOGRAPHY N/A 11/27/2016   Procedure: RIGHT/LEFT HEART CATH AND CORONARY ANGIOGRAPHY;  Surgeon: Martinique,  M, MD;  Location: [facility];  Service: Cardiovascular;  Laterality: N/A;  . SQUAMOUS CELL CARCINOMA EXCISION    . TONSILLECTOMY      History  Smoking Status  . Former Smoker  . Packs/day: 1.50  . Years: 30.00  . Types:  Cigarettes  . Quit date: 03/05/1978  Smokeless Tobacco  . Never Used    History  Alcohol Use No    Family History  Problem Relation Age of Onset  . Cancer Father        lung cancer  . Alzheimer's disease Mother     Review of Systems: As noted in history of present illness.  All other systems were reviewed and are negative.  Physical Exam: BP (!) 143/82   Pulse 97   Ht 5\' 6"  (1.676 m)   Wt 128 lb 3.2 oz (58.2 kg)   BMI 20.69 kg/m  Patient is very pleasant and in no acute distress.  Skin is warm and dry. Color is normal.  HEENT is unremarkable. Normocephalic/atraumatic. PERRL. Sclera are nonicteric. Neck is supple. No masses. No JVD. Lungs are clear. Cardiac exam shows a regular rate and rhythm. Harsh Grade 3-5/3 systolic murmur RUSB>>apex. Abdomen is soft. Extremities are without edema. Gait and ROM are intact. No gross neurologic deficits noted.  LABORATORY DATA:  Lab Results  Component Value Date   WBC 5.3 11/20/2016   HGB 10.0 (L) 11/20/2016   HCT 32.1 (L) 11/20/2016   PLT 299 11/20/2016   GLUCOSE 79 11/20/2016   NA 139 11/20/2016   K 4.7 11/20/2016   CL 102 11/20/2016   CREATININE 1.35 (H) 11/20/2016   BUN 21 11/20/2016   CO2 23 11/20/2016   TSH 0.90 06/13/2011   INR 1.1 11/20/2016   Labs dated 07/15/15: cholesterol 229,  triglycerides 111, HDL 76, LDL 131.  Dated 07/19/16: cholesterol 247, triglycerides 71, HDL 88, LDL 145. BUN 28, creatinine 1.3. Other chemistries, CBC and TSH normal.  Echo: 08/18/15 Study Conclusions  - Left ventricle: The cavity size was normal. Wall thickness was  normal. Systolic function was normal. The estimated ejection  fraction was in the range of 55% to 60%. Wall motion was normal;  there were no regional wall motion abnormalities. Features are  consistent with a pseudonormal left ventricular filling pattern,  with concomitant abnormal relaxation and increased filling  pressure (grade 2 diastolic dysfunction). - Aortic valve: There was moderate stenosis. There was mild to  moderate regurgitation directed centrally in the LVOT. - Mitral valve: There was mild regurgitation directed centrally. - Tricuspid valve: There was mild-moderate regurgitation directed  centrally. - Pulmonary arteries: Systolic pressure was mildly increased. PA  peak pressure: 34 mm Hg (S).  Echo 06/20/16: Study Conclusions  - Left ventricle: The cavity size was normal. There was mild   concentric hypertrophy. Systolic function was normal. The   estimated ejection fraction was in the range of 55% to 60%. Wall   motion was normal; there were no regional wall motion   abnormalities. - Aortic valve: Valve mobility was restricted. There was moderate   stenosis. There was moderate regurgitation. Peak velocity (S):   370 cm/s. Mean gradient (S): 30 mm Hg. Regurgitation pressure   half-time: 449 ms. - Mitral valve: Transvalvular velocity was within the normal range.   There was no evidence for stenosis. There was mild regurgitation. - Left atrium: The atrium was moderately dilated. - Right ventricle: The cavity size was normal. Wall thickness was   normal. Systolic function was normal. - Tricuspid valve: There was moderate regurgitation. - Pulmonary arteries: Systolic pressure was within the  normal   range. PA peak pressure: 36 mm Hg (S).  Impressions:  - No significant changes since 08/2015.  Labs from primary care dated 07/14/15: Normal CMET and CBC. Cholesterol  229, trig 111, HDL 76, LDL 131. TSH 1.98.   Myoview 10/03/16: Study Highlights     The left ventricular ejection fraction is normal (55-65%).  Nuclear stress EF: 59%.  There was no ST segment deviation noted during stress.  The study is normal.   1. EF 59%, normal wall motion.  2. No evidence for ischemia or infarction by perfusion images.   Normal study.     Procedures   RIGHT/LEFT HEART CATH AND CORONARY ANGIOGRAPHY  Conclusion     Mid LAD lesion, 30 %stenosed.  Mid LAD to Dist LAD lesion, 85 %stenosed.  Prox RCA to Mid RCA lesion, 20 %stenosed.  Dist RCA lesion, 30 %stenosed.  Ost LM lesion, 10 %stenosed.  LV end diastolic pressure is normal.  LV end diastolic pressure is normal.   1. Single vessel obstructive CAD with 85% mid LAD stenosis 2. Severe aortic stenosis. Mean gradient of 40 mm Hg. AVA 0.61 cm squared with index .37. 3. Normal cardiac output. 4. Normal right heart and LV filling pressures  Plan: will discuss options for AVR. I think her best option based on age and co-morbidities would be TAVR with stenting of mid LAD. Will discuss with heart valve team.   Indications   Nonrheumatic aortic valve stenosis [I35.0 (ICD-10-CM)]  Procedural Details/Technique   Technical Details Indication: 83 yo WF with progressive dyspnea on exertion and aortic stenosis  Procedural Details: The right wrist was prepped, draped, and anesthetized with 1% lidocaine. Using the modified Seldinger technique a 6 Fr slender sheath was placed in the right radial artery and a 5 French sheath was placed in the right brachial vein. We were unable to pass the Swan- Ganz catheter from the right brachial approach due to inability to pass catheter past the old pacing wires. The right groin was prepped,  draped and anesthetized with 1% lidocaine. A 7 Fr sheath was placed percutaneously in the right femoral vein. A Swan-Ganz catheter was used for the right heart catheterization. Standard protocol was followed for recording of right heart pressures and sampling of oxygen saturations. Fick cardiac output was calculated. Standard Judkins catheters were used for selective coronary angiography and left ventricular pressures. There were no immediate procedural complications. The patient was transferred to the post catheterization recovery area for further monitoring.  Contrast: 55 cc   Estimated blood loss <50 mL.  During this procedure the patient was administered the following to achieve and maintain moderate conscious sedation: Versed 1 mg, Fentanyl 25 mcg, while the patient's heart rate, blood pressure, and oxygen saturation were continuously monitored. The period of conscious sedation was 50 minutes, of which I was present face-to-face 100% of this time.    Complications   Complications documented before study signed (11/27/2016 11:04 AM EDT)    No complications were associated with this study.  Documented by Martinique,  M, MD - 11/27/2016 11:04 AM EDT    Coronary Findings   Dominance: Right  Left Main  Ost LM lesion, 10% stenosed. The lesion is moderately calcified.  Left Anterior Descending  Mid LAD lesion, 30% stenosed. The lesion is moderately calcified.  Mid LAD to Dist LAD lesion, 85% stenosed.  Left Circumflex  Vessel is small.  Right Coronary Artery  Prox RCA to Mid RCA lesion, 20% stenosed.  Dist RCA lesion, 30% stenosed.  Right Heart   Right Heart Pressures LV EDP is normal.    Left Heart   Left Ventricle LV end diastolic pressure is normal.    Coronary  Diagrams   Diagnostic Diagram       Implants     No implant documentation for this case.  MERGE Images   Show images for CARDIAC CATHETERIZATION   Link to Procedure Log   Procedure Log    Hemo Data     Most Recent Value  Fick Cardiac Output 3.62 L/min  Fick Cardiac Output Index 2.22 (L/min)/BSA  Aortic Mean Gradient 39.6 mmHg  Aortic Peak Gradient 38 mmHg  Aortic Valve Area 0.61  Aortic Value Area Index 0.37 cm2/BSA  RA A Wave 9 mmHg  RA V Wave 9 mmHg  RA Mean 6 mmHg  RV Systolic Pressure 34 mmHg  RV Diastolic Pressure 3 mmHg  RV EDP 10 mmHg  PA Systolic Pressure 32 mmHg  PA Diastolic Pressure 11 mmHg  PA Mean 20 mmHg  PW A Wave 18 mmHg  PW V Wave 23 mmHg  PW Mean 16 mmHg  AO Systolic Pressure 932 mmHg  AO Diastolic Pressure 71 mmHg  AO Mean 671 mmHg  LV Systolic Pressure 245 mmHg  LV Diastolic Pressure 5 mmHg  LV EDP 21 mmHg  Arterial Occlusion Pressure Extended Systolic Pressure 809 mmHg  Arterial Occlusion Pressure Extended Diastolic Pressure 67 mmHg  Arterial Occlusion Pressure Extended Mean Pressure 103 mmHg  Left Ventricular Apex Extended Systolic Pressure 983 mmHg  Left Ventricular Apex Extended Diastolic Pressure 3 mmHg  Left Ventricular Apex Extended EDP Pressure 22 mmHg  QP/QS 1  TPVR Index 9 HRUI  TSVR Index 48.14 HRUI  PVR SVR Ratio 0.04  TPVR/TSVR Ratio 0.19     Assessment / Plan: 1. Atrial fibrillation with sick sinus syndrome. Prior history of  persistent AFib.  Now on amiodarone since November- converted to NSR and no recurrence.   Continue long-term anticoagulation. Will switch Xarelto to Eliquis 2.5 mg bid.  Dose adjusted for age and renal function. S/p pacemaker replacement for failed RA lead.   2. Aortic stenosis- moderate by Echo in April but by cardiac cath data is now severe. I suspect this is the primary cause of her dyspnea on exertion. Risk for open heart surgery is at least intermediate given advanced age and comorbidities. Recommend TAVR. Will refer to valve team.  3. CAD with significant single vessel obstructive disease in the mid LAD. Would recommend PCI of the LAD first then referral for TAVR. Would treat with Plavix. Switch Xarelto to  Eliquis. Try and minimize time on ASA. Will schedule procedure for this Thursday October 18. The procedure and risks were reviewed including but not limited to death, myocardial infarction, stroke, arrythmias, bleeding, transfusion, emergency surgery, dye allergy, or renal dysfunction. The patient voices understanding and is agreeable to proceed.   4. HTN- controlled  5. Hyperlipidemia. Since she does have CAD will add Crestor 10 mg daily.

## 2016-12-20 NOTE — Progress Notes (Signed)
Pt copay will be $528.79. Pt has not met her deductible, pt has $1802.77 left to pay. Once pt has met the deductible copay will be 5% of drug cost.

## 2016-12-20 NOTE — Care Management Note (Addendum)
Case Management Note  Patient Details  Name: [name] [name] MRN: [id number] Date of Birth: [birth year]  Subjective/Objective:   From home alone, pta indep, she states her sister will be with her at discharge to assist her .  NCM gave her the co pay amt of eliqius, she states she was on xarelto before, wanted to know why she can not take xarelto, she stated MD told her eliquis will bee less bruising for her.  NCM gave information to RN, who contacted MD about the co pay amt. NCM gave patient the 30 day savings coupon for Eliquis, she will also be on plavix.                    Action/Plan: NCM will follow for dc needs.   Expected Discharge Date:                  Expected Discharge Plan:  Home/Self Care  In-House Referral:     Discharge planning Services  CM Consult  Post Acute Care Choice:    Choice offered to:     DME Arranged:    DME Agency:     HH Arranged:    [place] Agency:     Status of Service:  Completed, signed off  If discussed at [employer] of Stay Meetings, dates discussed:    Additional Comments:  Zenon Mayo, RN 12/20/2016, 5:04 PM

## 2016-12-21 ENCOUNTER — Other Ambulatory Visit: Payer: Self-pay | Admitting: Physician Assistant

## 2016-12-21 ENCOUNTER — Encounter (HOSPITAL_COMMUNITY): Payer: Self-pay | Admitting: Physician Assistant

## 2016-12-21 ENCOUNTER — Telehealth: Payer: Self-pay | Admitting: Cardiology

## 2016-12-21 DIAGNOSIS — I25118 Atherosclerotic heart disease of native coronary artery with other forms of angina pectoris: Secondary | ICD-10-CM

## 2016-12-21 DIAGNOSIS — I2510 Atherosclerotic heart disease of native coronary artery without angina pectoris: Secondary | ICD-10-CM | POA: Diagnosis not present

## 2016-12-21 DIAGNOSIS — N189 Chronic kidney disease, unspecified: Secondary | ICD-10-CM | POA: Diagnosis present

## 2016-12-21 DIAGNOSIS — I350 Nonrheumatic aortic (valve) stenosis: Secondary | ICD-10-CM

## 2016-12-21 DIAGNOSIS — E785 Hyperlipidemia, unspecified: Secondary | ICD-10-CM | POA: Diagnosis not present

## 2016-12-21 DIAGNOSIS — I129 Hypertensive chronic kidney disease with stage 1 through stage 4 chronic kidney disease, or unspecified chronic kidney disease: Secondary | ICD-10-CM | POA: Diagnosis not present

## 2016-12-21 DIAGNOSIS — I2584 Coronary atherosclerosis due to calcified coronary lesion: Secondary | ICD-10-CM | POA: Diagnosis not present

## 2016-12-21 LAB — BASIC METABOLIC PANEL
Anion gap: 10 (ref 5–15)
BUN: 16 mg/dL (ref 6–20)
CALCIUM: 9.2 mg/dL (ref 8.9–10.3)
CO2: 26 mmol/L (ref 22–32)
CREATININE: 1.35 mg/dL — AB (ref 0.44–1.00)
Chloride: 100 mmol/L — ABNORMAL LOW (ref 101–111)
GFR calc non Af Amer: 35 mL/min — ABNORMAL LOW (ref >60)
GFR, EST AFRICAN AMERICAN: 40 mL/min — AB (ref >60)
GLUCOSE: 85 mg/dL (ref 65–99)
Potassium: 4 mmol/L (ref 3.5–5.1)
Sodium: 136 mmol/L (ref 135–145)

## 2016-12-21 LAB — CBC
HCT: 30.5 % — ABNORMAL LOW (ref 36.0–46.0)
Hemoglobin: 9.5 g/dL — ABNORMAL LOW (ref 12.0–15.0)
MCH: 27 pg (ref 26.0–34.0)
MCHC: 31.1 g/dL (ref 30.0–36.0)
MCV: 86.6 fL (ref 78.0–100.0)
PLATELETS: 242 10*3/uL (ref 150–400)
RBC: 3.52 MIL/uL — AB (ref 3.87–5.11)
RDW: 16.4 % — ABNORMAL HIGH (ref 11.5–15.5)
WBC: 5.8 10*3/uL (ref 4.0–10.5)

## 2016-12-21 MED ORDER — AMLODIPINE BESYLATE 2.5 MG PO TABS: 2.5000 mg | ORAL_TABLET | Freq: Every day | ORAL | 6 refills | Status: DC

## 2016-12-21 MED ORDER — NITROGLYCERIN 0.4 MG SL SUBL: 0.4000 mg | SUBLINGUAL_TABLET | SUBLINGUAL | 11 refills | Status: DC | PRN

## 2016-12-21 MED FILL — Lidocaine HCl Local Inj 2%: INTRAMUSCULAR | Qty: 10 | Status: AC

## 2016-12-21 MED FILL — Nitroglycerin IV Soln 100 MCG/ML in D5W: INTRA_ARTERIAL | Qty: 10 | Status: AC

## 2016-12-21 NOTE — Discharge Summary (Signed)
[facility] TEAM   Discharge Summary    Patient ID: [name] [name],  MRN: [id number], DOB/AGE: 03/11/1929 83 y.o.  Admit date: 12/20/2016 Discharge date: 12/21/2016  Primary Care Provider: [name] Primary Cardiologist: Dr. Martinique / Dr. [name] (EP)   Discharge Diagnoses    Principal Problem:   CAD in native artery Active Problems:   HLD (hyperlipidemia)   Essential hypertension   ATRIAL FIBRILLATION   BRADYCARDIA-TACHYCARDIA SYNDROME   Severe aortic stenosis   Long term current use of anticoagulant therapy   Pacemaker-[employer]   CKD (chronic kidney disease)   Allergies Allergies  Allergen Reactions  . Penicillins Anaphylaxis    Has patient had a PCN reaction causing immediate rash, facial/tongue/throat swelling, SOB or lightheadedness with hypotension: Yes Has patient had a PCN reaction causing severe rash involving mucus membranes or skin necrosis: No Has patient had a PCN reaction that required hospitalization: No Has patient had a PCN reaction occurring within the last 10 years: No If all of the above answers are "NO", then may proceed with Cephalosporin use.   . Tetanus Toxoid Anaphylaxis  . Demerol Other (See Comments)    Severe nausea  . Codeine Nausea Only    Severe nausea     History of Present Illness     [name] [name] is a 83 y.o. female with a history of PAF on anticoagulation, tachy-brady s/p PPM, HTN, HLD, hypothyroidism, severe aortic stenosis and recently diagnosed CAD who presented to [facility] on 12/20/16 for planned PCI to her LAD.  She has a history of PAF. She was on chronic anticoagulation with Xarelto. Echo in July 2015 showed moderate aortic stenosis that had progressed since 2012. Repeat in July 2016 and July 2017 showed no change. In April 2017, she had atrial fibrillation that converted spontaneously. This was felt to be triggered by a steroid injection. Her metoprolol was increased at that  time. When seen in June 2017 she was noted to be in persistent Afib. She was placed on Flecainide, but this was later stopped due to side effects. She was seen in November with recurrent and persistent Afib. Rate was controlled but she was symptomatic. Seen in Afib clinic and started on amiodarone with subsequent conversion to NSR.  She continued to complain of dyspnea on exertion despite being back in NSR and predated being on amiodarone. She also complained of epigastric discomfort and a sinking feeling that made her feel weak. Of note, recent upper and lower EGD was negative, PFTs from 02/2016 showed mild obstructive defect with marked decrease in diffusion capacity and CT abdomen in 06/2015 was also unremarkable.   When seen by Dr. [name] in late July it was noted that she had failure of her RA lead. Her device was reprogrammed but she developed pectoral stimulation. She underwent explantation of her old generator and placement of a new left chest dual chamber pacemaker on 10/09/16. Because of her ongoing symptoms we recommended a right and left heart cath and this was done on 11/27/16. This demonstrated severe AS as well as a severe stenosis in the mid LAD.   She was seen back in the office by Dr. Martinique and it was decided to proceed with stenting to LAD and TAVR work up. Her Xarelto 15mg  daily was discontinued and she was prescribed Eliquis 2.5mg  BID to start after her PCI, which was set up for 12/20/16.   Hospital Course     Consultants: none  CAD: s/p  DES to LAD yesterday. She will be on triple therapy with ASA 81mg  daily, plavix 75mg  daily and Eliquis 2.5mg  BID x 1 month and then drop ASA. There has been some concern that she may not be able to afford Eliquis long term. She has a 30 day free card and will check with her insurance company when she goes home. She will likely continue on Eliquis 2.5mg  BID and plavix 75mg  daily x6 months after TAVR. Continue BB and statin.   Severe AS: she was found  to have severe AS on heart cath and undergoing work up for TAVR. I will arrange for an updated echocardiogram as an outpatient since last one was over 6 months ago and only showed moderate AS at that time. This has been arranged for 10/24. Other pre TAVR work up including CT scans, carotid dopplers, PT eval and surgical consultations will be arranged as an outpatient   HTN: BP has been elevated and amlodipine 2.5mg  daily was added .  HLD: continue statin   PAF: continue amiodarone and lopressor. She will be discharged on Eliquis 2.5mg  BID. She has a 30 day free card and will touch base with her insurance company as outlined above   SSS s/p PPM: followed by Dr. [name]  CKD: creat has remained stable after heart cath. We will stage CT scans and give bicarb prior to test.    The patient has had an uncomplicated hospital course and is recovering well. The radial catheter sites is stable. She has been seen by Dr. Burt Knack today and deemed ready for discharge home. All follow-up appointments have been scheduled. Discharge medications are listed below.  _____________  Discharge Vitals Blood pressure (!) 144/66, pulse 68, temperature 97.6 F (36.4 C), temperature source Oral, resp. rate 17, height 5\' 6"  (1.676 m), weight 125 lb (56.7 kg), SpO2 98 %.  Filed Weights   12/20/16 0925  Weight: 125 lb (56.7 kg)    Labs & Radiologic Studies     CBC  Recent Labs  12/21/16 0542  WBC 5.8  HGB 9.5*  HCT 30.5*  MCV 86.6  PLT 573   Basic Metabolic Panel  Recent Labs  12/20/16 0936 12/21/16 0542  NA 133* 136  K 4.8 4.0  CL 97* 100*  CO2 27 26  GLUCOSE 101* 85  BUN 20 16  CREATININE 1.40* 1.35*  CALCIUM 9.6 9.2   Liver Function Tests No results for input(s): AST, ALT, ALKPHOS, BILITOT, PROT, ALBUMIN in the last 72 hours. No results for input(s): LIPASE, AMYLASE in the last 72 hours. Cardiac Enzymes No results for input(s): CKTOTAL, CKMB, CKMBINDEX, TROPONINI in the last 72  hours. BNP Invalid input(s): POCBNP D-Dimer No results for input(s): DDIMER in the last 72 hours. Hemoglobin A1C No results for input(s): HGBA1C in the last 72 hours. Fasting Lipid Panel No results for input(s): CHOL, HDL, LDLCALC, TRIG, CHOLHDL, LDLDIRECT in the last 72 hours. Thyroid Function Tests No results for input(s): TSH, T4TOTAL, T3FREE, THYROIDAB in the last 72 hours.  Invalid input(s): FREET3  No results found.   Diagnostic Studies/Procedures    PCI 12/20/16 Conclusion   Mid LAD to Dist LAD lesion, 85 %stenosed.  A STENT PROMUS PREM MR 2.75X16 drug eluting stent was successfully placed.  Post intervention, there is a 0% residual stenosis.   1. Successful stenting of the mid LAD with DES  Plan: IV hydration post procedure. Follow renal function closely. Continue DAPT for one month then discontinue ASA. May start Eliquis tomorrow if  no bleeding. Referral for TAVR evaluation.   _____________  Cath 11/27/16 Procedures  RIGHT/LEFT HEART CATH AND CORONARY ANGIOGRAPHY  Conclusion    Mid LAD lesion, 30 %stenosed.  Mid LAD to Dist LAD lesion, 85 %stenosed.  Prox RCA to Mid RCA lesion, 20 %stenosed.  Dist RCA lesion, 30 %stenosed.  Ost LM lesion, 10 %stenosed.  LV end diastolic pressure is normal.  LV end diastolic pressure is normal.   1. Single vessel obstructive CAD with 85% mid LAD stenosis 2. Severe aortic stenosis. Mean gradient of 40 mm Hg. AVA 0.61 cm squared with index .37. 3. Normal cardiac output. 4. Normal right heart and LV filling pressures  Plan: will discuss options for AVR. I think her best option based on age and co-morbidities would be TAVR with stenting of mid LAD. Will discuss with heart valve team.     Disposition   Pt is being discharged home today in good condition.  Follow-up Plans & Appointments    Follow-up Information    Martinique, Peter M, MD. Go on 01/07/2017.   Specialty:  Cardiology Why:  @ 11:40am. Theodosia Quay  will call you to arrange your CT scans.  Contact information: [address] [place] [address] [phone]        [facility]. Go on 12/26/2016.   Why:  @ 2pm for an ultrasound of your heart.  Contact information: [place] [address] [phone]           Discharge Medications     Medication List    TAKE these medications   acetaminophen 500 MG tablet Commonly known as:  TYLENOL Take 1,000 mg by mouth every 4 (four) hours as needed for moderate pain or headache.   amiodarone 200 MG tablet Commonly known as:  PACERONE Take 1/2 tablet (100 mg) by mouth daily What changed:  how much to take  how to take this  when to take this  additional instructions   amLODipine 2.5 MG tablet Commonly known as:  NORVASC Take 1 tablet (2.5 mg total) by mouth daily.   apixaban 2.5 MG Tabs tablet Commonly known as:  ELIQUIS Take 1 tablet (2.5 mg total) by mouth 2 (two) times daily.   aspirin EC 81 MG tablet Take 81 mg by mouth daily.   calcium carbonate 600 MG Tabs tablet Commonly known as:  OS-CAL Take 600 mg by mouth every evening.   clopidogrel 75 MG tablet Commonly known as:  PLAVIX Take 1 tablet (75 mg total) by mouth daily. What changed:  when to take this   dicyclomine 10 MG capsule Commonly known as:  BENTYL Take 10 mg by mouth 2 (two) times daily as needed for spasms.   furosemide 20 MG tablet Commonly known as:  LASIX Take 1 tablet (20 mg total) by mouth daily as needed (weight gain). What changed:  reasons to take this   ICAPS AREDS 2 Caps Take 1 capsule by mouth 2 (two) times daily.   levothyroxine 100 MCG tablet Commonly known as:  SYNTHROID, LEVOTHROID Take 100 mcg by mouth daily before breakfast.   loratadine 10 MG tablet Commonly known as:  CLARITIN Take 10 mg by mouth daily as needed for allergies.   metoprolol tartrate 50 MG  tablet Commonly known as:  LOPRESSOR Take 50 mg by mouth 2 (two) times daily.   polyvinyl alcohol 1.4 % ophthalmic solution Commonly known as:  LIQUIFILM TEARS Place 1-2 drops into  both eyes 3 (three) times daily as needed for dry eyes.   rosuvastatin 10 MG tablet Commonly known as:  CRESTOR Take 1 tablet (10 mg total) by mouth daily.   sodium chloride 0.65 % Soln nasal spray Commonly known as:  OCEAN Place 1 spray into both nostrils as needed for congestion.   Vitamin D 1000 units capsule Take 1,000 Units by mouth daily.         Outstanding Labs/Studies   TAVR work up  Duration of Discharge Encounter   Greater than 30 minutes including physician time.  Signed, Angelena Form PA-C 12/21/2016, 11:10 AM

## 2016-12-21 NOTE — Progress Notes (Signed)
Elliquis cost at her pharmacy is 38.70 per the pharmacist, he has already filled it for her and she picked it up on 10/15.  She is able to afford this price.

## 2016-12-21 NOTE — Telephone Encounter (Signed)
Spoke to patient NTG prescription sent to pharmacy.

## 2016-12-21 NOTE — Progress Notes (Signed)
Progress Note  Patient Name: [name] [name] Date of Encounter: 12/21/2016  Primary Cardiologist: Dr. Martinique  Subjective   No chest pain or shortness of breath.    Inpatient Medications    Scheduled Meds: . amiodarone  100 mg Oral Daily  . amLODipine  2.5 mg Oral Daily  . aspirin EC  81 mg Oral Daily  . calcium carbonate  1,250 mg Oral QPM  . cholecalciferol  1,000 Units Oral Daily  . clopidogrel  75 mg Oral Daily  . levothyroxine  100 mcg Oral QAC breakfast  . metoprolol tartrate  50 mg Oral BID  . multivitamin  1 tablet Oral BID  . rosuvastatin  10 mg Oral Daily  . sodium chloride flush  3 mL Intravenous Q12H   Continuous Infusions: . sodium chloride     PRN Meds: sodium chloride, acetaminophen, dicyclomine, loratadine, ondansetron (ZOFRAN) IV, polyvinyl alcohol, sodium chloride, sodium chloride flush   Vital Signs    Vitals:   12/20/16 2000 12/20/16 2147 12/20/16 2305 12/21/16 0521  BP: (!) 146/73 (!) 175/82 (!) 166/73 (!) 161/72  Pulse: 77 61    Resp: 15  (!) 21 (!) 21  Temp: 98.2 F (36.8 C)   98.1 F (36.7 C)  TempSrc: Oral   Oral  SpO2: 96%   96%  Weight:      Height:        Intake/Output Summary (Last 24 hours) at 12/21/16 0721 Last data filed at 12/20/16 2148  Gross per 24 hour  Intake                3 ml  Output              400 ml  Net             -397 ml   Filed Weights   12/20/16 0925  Weight: 125 lb (56.7 kg)    Telemetry    A paced @ controlled rate- Personally Reviewed  ECG    A paced - Personally Reviewed  Physical Exam   GEN: No acute distress.   Neck: No JVD Cardiac: RRR, 0-0/7 systolic murmurs, rubs, or gallops. R radial cath site with mild hematoma and severe bruise Respiratory: Clear to auscultation bilaterally. GI: Soft, nontender, non-distended  MS: No edema; No deformity. Neuro:  Nonfocal  Psych: Normal affect   Labs    Chemistry Recent Labs Lab 12/17/16 0942 12/20/16 0936  NA 137 133*  K 5.5* 4.8  CL  98 97*  CO2 25 27  GLUCOSE 78 101*  BUN 27 20  CREATININE 1.59* 1.40*  CALCIUM 9.8 9.6  GFRNONAA 29* 33*  GFRAA 34* 39*  ANIONGAP  --  9     Hematology Recent Labs Lab 12/17/16 0942 12/21/16 0542  WBC 5.0 5.8  RBC 3.70* 3.52*  HGB 10.1* 9.5*  HCT 32.0* 30.5*  MCV 87 86.6  MCH 27.3 27.0  MCHC 31.6 31.1  RDW 15.5* 16.4*  PLT 309 242    Radiology    No results found.  Cardiac Studies   CORONARY STENT INTERVENTION  Conclusion     Mid LAD to Dist LAD lesion, 85 %stenosed.  A STENT PROMUS PREM MR 2.75X16 drug eluting stent was successfully placed.  Post intervention, there is a 0% residual stenosis.   1. Successful stenting of the mid LAD with DES  Plan: IV hydration post procedure. Follow renal function closely. Continue DAPT for one month then discontinue ASA. May start Eliquis tomorrow  if no bleeding. Referral for TAVR evaluation.     Patient Profile     83 y.o. female with hx of PAF with tacy-brady syndrome s/p dual chamber PPM (chage 10/09/16), severe AS, HTN, HLD presents for outpatient cath.   Prior hx of cardioversion and non tolerance to Flecainide. Recently complained for SOB with activity. - this was present even before amiodarone and hasn't improved with restoration of NSR. Due to ongoing symptoms receomended R & L cath. This was done 11/27/16 and demonstrated severe AS as well as a severe stenosis in the mid LAD. She was seen by Dr. Martinique 12/17/16 to review and discuss treatment options. Pending evaluation by valve team for possible TVAR. Here to PCI of LAD. Switchde Xarelto to Eliquis.   Assessment & Plan    1. CAD - S/p DES to mLAD. Continue DAPT for one month then discontinue ASA. Pending cardiac rehab.   2. Severe Aortic stenosis - Pending evaluation of TVAR  3. HTN  - Elevated. Continue lopressor 50mg  BID. Up-titrate Norvasc if no improvement.   4. HLD - continue statin  5. PAF with sick sinus syndrome - S/p PPM. Plan to start  Eliquis today however copay is $528.79 and she just refilled 90 days supplies of Xarelto. MD to resume anticoagulation.   For questions or updates, please contact [facility] Please consult [email or web address] for contact info under Cardiology/STEMI.      Signed, Leanor Kail, PA  12/21/2016, 7:21 AM    Patient seen, examined. Available data reviewed. Agree with findings, assessment, and plan as outlined by [name], PA-C. This is an elderly woman in no distress. Lungs are clear. Heart is regular rate and rhythm with a 4/6 harsh late peaking systolic murmur at the right upper sternal border. Right radial site is clear. There is no peripheral edema.  Plans as outlined yesterday by Dr. Martinique. The patient is stable for discharge. She will take "triple therapy" for one month. This will consist of apixaban 2.5 mg twice daily, aspirin 81 mg daily, and Plavix 75 mg daily. Will continue her TAVR evaluation with CT scans and cardiac surgical evaluation. Hopefully her workup can be completed and we can move forward with TAVR in about 1 month. I would anticipate after TAVR that she will be treated with apixaban 2.5 mg BID long-term and plavix 75 mg x 6 months. Cost of apixaban is an issue as outlined above. However, the patient is given a 30 day card for short-term use.She is going to touch base with her insurance company when she gets home.  Sherren Mocha, M.D. 12/21/2016 9:31 AM

## 2016-12-21 NOTE — Progress Notes (Signed)
CARDIAC REHAB PHASE I   PRE:  Rate/Rhythm: 65 pacing    BP: sitting 139/56    SaO2:   MODE:  Ambulation: 400 ft   POST:  Rate/Rhythm: 88 pacing    BP: sitting 167/77     SaO2:   Tolerated well, steady, slow pace. Denied her normal "feeling" in her chest from angina.  Ed completed with pt, voicing good understanding. She understands her Plavix/ASA. Discussed CRPII however we will wait to refer after her TAVR. She has the brochure. Encouraged light walking in the next month before her TAVR. Very receptive. [place], ACSM 12/21/2016 9:16 AM

## 2016-12-21 NOTE — Discharge Instructions (Signed)

## 2016-12-21 NOTE — Telephone Encounter (Signed)
New message     Patient calling states she was discharged from [facility] today. Requesting a prescription for Nitroglycerin. Please call

## 2016-12-21 NOTE — Telephone Encounter (Signed)
I do not see Nitro on medication list. Ok to rx?

## 2016-12-25 ENCOUNTER — Encounter: Payer: Self-pay | Admitting: Physician Assistant

## 2016-12-25 ENCOUNTER — Telehealth: Payer: Self-pay | Admitting: Physician Assistant

## 2016-12-25 ENCOUNTER — Other Ambulatory Visit: Payer: Self-pay | Admitting: Physician Assistant

## 2016-12-25 ENCOUNTER — Telehealth: Payer: Self-pay | Admitting: Cardiology

## 2016-12-25 DIAGNOSIS — I350 Nonrheumatic aortic (valve) stenosis: Secondary | ICD-10-CM

## 2016-12-25 NOTE — Telephone Encounter (Signed)
Returned call to patient [name]'s recommendations given. 

## 2016-12-25 NOTE — Telephone Encounter (Signed)
Should stay off lisinopril. She is supposed to be on ASA 81 mg daily, Plavix 75 mg daily, and Eliquis 2.5 mg twice a day (providing she can afford Eliquis)- if not would continue Xarelto 15 mg daily. Will stop ASA one month after stent.   Kahlia Lagunes Martinique MD, [facility]

## 2016-12-25 NOTE — Telephone Encounter (Signed)
Please call,question about her medicine. °

## 2016-12-25 NOTE — Telephone Encounter (Signed)
  [facility]   MULTIDISCIPLINARY HEART VALVE TEAM  I spoke to Ms. Mahler today to remind her of her echo tomorrow and her CT scans set up for 12/31/16. The following letter was sent out to her house.   Dear Ms. Glory Rosebush,  You are scheduled for pre TAVR testing on Monday 12/31/16 at [facility] (Main Entrance A, Valet Parking). Please arrive at 1pm in Admitting for check in.  . Your CT scans (of chest/abdomen/pelvis/heart) will begin at 2:30pm. You will require IV hydration with bicarbonate before and after these scans to help protect your kidneys.  Pre procedure instructions for 12/31/16. . Drink plenty of water. . Do not consume any caffeinated/decaffeinated beverages or chocolate 12 hours prior to your test. . Do not take any antihistamines 12 hours prior to your test. . Do not eat any food 4 hours prior to the test. . You may take your regular medications prior to the test. . HOLD Furosemide morning of the test.  After the Test: . Drink plenty of water. . After receiving IV contrast, you may experience a mild flushed feeling. This is normal. . On occasion, you may experience a mild rash up to 24 hours after the test. This is not dangerous. If this occurs, you can take Benadryl 25 mg and increase your fluid intake. . If you experience trouble breathing, this can be serious. If it is severe call 911 IMMEDIATELY. If it is mild, please call our office.  If you have any questions please do not hesitate to call our office at [phone].   Sincerely,  Nell Range, Structural Heart PA-C

## 2016-12-25 NOTE — Telephone Encounter (Signed)
Returned call to patient.She stated she wanted to ask [name] if she is suppose to be on aspirin,plavix,eliquis.Also lisinopril was stopped in hospital.She wanted to know if she needs to be taking.Message sent to [place] for advice.

## 2016-12-26 ENCOUNTER — Other Ambulatory Visit: Payer: Self-pay

## 2016-12-26 ENCOUNTER — Ambulatory Visit (HOSPITAL_BASED_OUTPATIENT_CLINIC_OR_DEPARTMENT_OTHER): Payer: Medicare Other

## 2016-12-26 DIAGNOSIS — E785 Hyperlipidemia, unspecified: Secondary | ICD-10-CM | POA: Diagnosis not present

## 2016-12-26 DIAGNOSIS — N189 Chronic kidney disease, unspecified: Secondary | ICD-10-CM | POA: Diagnosis not present

## 2016-12-26 DIAGNOSIS — I080 Rheumatic disorders of both mitral and aortic valves: Secondary | ICD-10-CM | POA: Diagnosis not present

## 2016-12-26 DIAGNOSIS — I4891 Unspecified atrial fibrillation: Secondary | ICD-10-CM | POA: Diagnosis not present

## 2016-12-26 DIAGNOSIS — I350 Nonrheumatic aortic (valve) stenosis: Secondary | ICD-10-CM

## 2016-12-26 DIAGNOSIS — I1310 Hypertensive heart and chronic kidney disease without heart failure, with stage 1 through stage 4 chronic kidney disease, or unspecified chronic kidney disease: Secondary | ICD-10-CM | POA: Diagnosis not present

## 2016-12-26 DIAGNOSIS — I495 Sick sinus syndrome: Secondary | ICD-10-CM | POA: Diagnosis not present

## 2016-12-26 DIAGNOSIS — I2510 Atherosclerotic heart disease of native coronary artery without angina pectoris: Secondary | ICD-10-CM | POA: Diagnosis not present

## 2016-12-26 DIAGNOSIS — I6523 Occlusion and stenosis of bilateral carotid arteries: Secondary | ICD-10-CM | POA: Diagnosis not present

## 2016-12-26 DIAGNOSIS — Z87891 Personal history of nicotine dependence: Secondary | ICD-10-CM | POA: Diagnosis not present

## 2016-12-27 ENCOUNTER — Ambulatory Visit (HOSPITAL_COMMUNITY)
Admission: RE | Admit: 2016-12-27 | Discharge: 2016-12-27 | Disposition: A | Discharge status: Home / Self Care | Payer: Medicare Other | Source: Ambulatory Visit | Attending: Physician Assistant | Admitting: Physician Assistant

## 2016-12-27 DIAGNOSIS — I080 Rheumatic disorders of both mitral and aortic valves: Secondary | ICD-10-CM | POA: Diagnosis not present

## 2016-12-27 DIAGNOSIS — I1310 Hypertensive heart and chronic kidney disease without heart failure, with stage 1 through stage 4 chronic kidney disease, or unspecified chronic kidney disease: Secondary | ICD-10-CM | POA: Insufficient documentation

## 2016-12-27 DIAGNOSIS — I2510 Atherosclerotic heart disease of native coronary artery without angina pectoris: Secondary | ICD-10-CM | POA: Insufficient documentation

## 2016-12-27 DIAGNOSIS — N189 Chronic kidney disease, unspecified: Secondary | ICD-10-CM | POA: Insufficient documentation

## 2016-12-27 DIAGNOSIS — I4891 Unspecified atrial fibrillation: Secondary | ICD-10-CM | POA: Insufficient documentation

## 2016-12-27 DIAGNOSIS — I350 Nonrheumatic aortic (valve) stenosis: Secondary | ICD-10-CM | POA: Diagnosis not present

## 2016-12-27 DIAGNOSIS — E785 Hyperlipidemia, unspecified: Secondary | ICD-10-CM | POA: Insufficient documentation

## 2016-12-27 DIAGNOSIS — I6523 Occlusion and stenosis of bilateral carotid arteries: Secondary | ICD-10-CM | POA: Insufficient documentation

## 2016-12-27 DIAGNOSIS — I495 Sick sinus syndrome: Secondary | ICD-10-CM | POA: Insufficient documentation

## 2016-12-27 DIAGNOSIS — Z87891 Personal history of nicotine dependence: Secondary | ICD-10-CM | POA: Insufficient documentation

## 2016-12-27 NOTE — Progress Notes (Signed)
Carotid Duplex has been completed. Preliminary results can be found: Chart review -> CV Proc  Landry Mellow, RDMS, RVT

## 2016-12-28 ENCOUNTER — Other Ambulatory Visit: Payer: Self-pay | Admitting: Physician Assistant

## 2016-12-28 DIAGNOSIS — I350 Nonrheumatic aortic (valve) stenosis: Secondary | ICD-10-CM

## 2016-12-31 ENCOUNTER — Ambulatory Visit (HOSPITAL_COMMUNITY)
Admission: RE | Admit: 2016-12-31 | Discharge: 2016-12-31 | Disposition: A | Discharge status: Home / Self Care | Payer: Medicare Other | Source: Ambulatory Visit | Attending: Physician Assistant | Admitting: Physician Assistant

## 2016-12-31 ENCOUNTER — Encounter: Payer: Self-pay | Admitting: *Deleted

## 2016-12-31 DIAGNOSIS — K5730 Diverticulosis of large intestine without perforation or abscess without bleeding: Secondary | ICD-10-CM | POA: Insufficient documentation

## 2016-12-31 DIAGNOSIS — I700 Atherosclerosis of aorta: Secondary | ICD-10-CM | POA: Insufficient documentation

## 2016-12-31 DIAGNOSIS — I350 Nonrheumatic aortic (valve) stenosis: Secondary | ICD-10-CM

## 2016-12-31 DIAGNOSIS — J984 Other disorders of lung: Secondary | ICD-10-CM | POA: Diagnosis not present

## 2016-12-31 MED ORDER — SODIUM BICARBONATE 8.4 % IV SOLN
INTRAVENOUS | Status: AC
  Administered 2016-12-31: 15:00:00 via INTRAVENOUS
  Filled 2016-12-31: qty 500

## 2016-12-31 MED ORDER — SODIUM BICARBONATE BOLUS VIA INFUSION
INTRAVENOUS | Status: AC
  Administered 2016-12-31: 75 meq via INTRAVENOUS
  Filled 2016-12-31: qty 1

## 2016-12-31 MED ORDER — IOPAMIDOL (ISOVUE-370) INJECTION 76%
100.0000 mL | Freq: Once | INTRAVENOUS | Status: DC | PRN
Start: 2016-12-31 — End: 2017-01-01

## 2017-01-02 ENCOUNTER — Ambulatory Visit: Payer: Medicare Other | Attending: Physician Assistant | Admitting: Physical Therapy

## 2017-01-02 ENCOUNTER — Encounter: Payer: Self-pay | Admitting: Physical Therapy

## 2017-01-02 ENCOUNTER — Encounter: Payer: Self-pay | Admitting: Thoracic Surgery (Cardiothoracic Vascular Surgery)

## 2017-01-02 ENCOUNTER — Institutional Professional Consult (permissible substitution) (INDEPENDENT_AMBULATORY_CARE_PROVIDER_SITE_OTHER): Payer: Medicare Other | Admitting: Thoracic Surgery (Cardiothoracic Vascular Surgery)

## 2017-01-02 VITALS — BP 122/79 | HR 63 | Resp 16 | Ht 65.0 in | Wt 126.0 lb

## 2017-01-02 DIAGNOSIS — R2689 Other abnormalities of gait and mobility: Secondary | ICD-10-CM | POA: Insufficient documentation

## 2017-01-02 DIAGNOSIS — I350 Nonrheumatic aortic (valve) stenosis: Secondary | ICD-10-CM

## 2017-01-02 NOTE — Progress Notes (Signed)
[facility] CONSULTATION REPORT  Referring Provider is Martinique, Peter M, MD PCP is [name], MD  Chief Complaint  Patient presents with  . New Patient (Initial Visit)    TAVR evaluation    HPI:  Patient is an 83 year old female with aortic stenosis, coronary artery disease, atrial fibrillation on long-term anticoagulation, tachybradycardia syndrome status post permanent pacemaker placement, hypertension, and hyperlipidemia who has been referred for surgical consultation to discuss treatment options for management of severe symptomatic aortic stenosis. The patient has a long history of atrial fibrillation and tachybradycardia syndrome and she underwent permanent pacemaker placement in the remote past and this had a variety of complications requiring generator change and ultimately pacemaker replacement. She has history of paroxysmal atrial fibrillation and has been followed for several years in the atrial fibrillation clinic. Previous echocardiograms have demonstrated the presence of moderate aortic stenosis with normal left ventricular systolic function. Echocardiogram performed 06/20/2016 revealed peak velocity across the aortic valve measured 3.7 m/s corresponding to mean transvalvular gradient estimated 30 mmHg. The DVI was notably only 0.19. Left ventricular systolic function remained normal with ejection fraction estimated 55-60%. Over the past 4-6 months the patient has developed symptoms of exertional shortness of breath and chest discomfort. She was seen in the atrial fibrillation clinic and noted to be in persistent atrial fibrillation. She was started on amiodarone and converted to sinus rhythm.  Several months ago she was seen in the atrial fibrillation clinic and noted to have failure of her right atrial lead for her pacemaker. She ultimately underwent explant and placement of a new dual-chamber pacemaker on  10/09/2016. Despite this the patient continued to complain of symptoms of exertional shortness of breath and chest discomfort. She was seen in follow-up by Dr. Martinique in early September and subsequently underwent diagnostic cardiac catheterization on 11/27/2016. She was found to have severe aortic stenosis with severe single-vessel coronary artery disease. Mean transvalvular gradient across the aortic valve was measured 40 mmHg corresponding to aortic valve area calculated 0.61 cm. There was normal cardiac output and normal right heart and left ventricular filling pressures. Coronary angiography revealed 85% stenosis of the mid left anterior descending coronary artery with otherwise mild nonobstructive coronary artery disease.  The patient was referred to the multidisciplinary heart valve team and subsequently underwent PCI and stenting of the left anterior descending coronary artery using a drug eluding stent on 12/20/2016. She was subsequently referred for surgical consultation.  The patient is widowed and lives alone in [place]. Her sister-in-law lives next door, is very supportive and accompanies her for her consultation today. The patient is a retired Radio producer and remain physically active all of her adult life. She reports no significant physical limitations until over the past 4-6 months during which time she has developed exertional shortness of breath and chest discomfort. This limits her considerably. She used to go walking on a daily basis but can no longer do so. She now gets short of breath with moderate activity. She states that she may have experienced some improvement over last 2 weeks since she underwent stenting, but she still gets some exertional shortness of breath and chest discomfort, particularly when she goes up a flight of stairs. She denies any history of resting shortness of breath, PND, orthopnea, or lower extremity edema. She has not had palpitations or syncope. She reports  occasional mild dizzy spells.   Past Medical History:  Diagnosis Date  . Aortic stenosis, severe  a. undergoing work up for TAVR  . CKD (chronic kidney disease)   . Coronary artery disease    a. 12/20/2016: s/p DES to LAD   . Diverticulosis   . GERD (gastroesophageal reflux disease)   . Hemorrhoids   . Hyperlipidemia   . Hypertension   . Hypothyroidism   . Osteoarthritis   . Paroxysmal atrial fibrillation (HCC)    a. on [place] with Xarelto but switched to Eliquis after stenting.   . Sick sinus syndrome ([place])    a. s/p PPM (MDT)    Past Surgical History:  Procedure Laterality Date  . ABDOMINAL HYSTERECTOMY  1980  . BASAL CELL CARCINOMA EXCISION    . CARDIAC CATHETERIZATION  ~ 11/2016  . CATARACT EXTRACTION W/ INTRAOCULAR LENS  IMPLANT, BILATERAL Bilateral   . CORONARY ANGIOPLASTY WITH STENT PLACEMENT  12/20/2016   "1 stent"  . CORONARY STENT INTERVENTION N/A 12/20/2016   Procedure: CORONARY STENT INTERVENTION;  Surgeon: Martinique, Peter M, MD;  Location: [facility];  Service: Cardiovascular;  Laterality: N/A;  . FINGER SURGERY Left    "fell; developed skiers thumb; had to operate on it"  . INSERT / REPLACE / [place]   for syptomatic bradycardia and syncope -- in [facility]  . INSERT / REPLACE / REMOVE PACEMAKER  2001   pulse generator replacement by Dr. Rollene Fare   . INSERT / REPLACE / REMOVE PACEMAKER  04/01/2008   PPM Medtronic -- model # [id number] serial # [id number] -- pulse generator replacement by Dr. Verlon Setting   . INSERT / REPLACE / REMOVE PACEMAKER  10/09/2016  . LEAD REVISION/REPAIR N/A 10/09/2016   Procedure: Lead Revision/Repair;  Surgeon: Thompson Grayer, MD;  Location: [facility];  Service: Cardiovascular;  Laterality: N/A;  . RIGHT/LEFT HEART CATH AND CORONARY ANGIOGRAPHY N/A 11/27/2016   Procedure: RIGHT/LEFT HEART CATH AND CORONARY ANGIOGRAPHY;  Surgeon: Martinique, Peter M, MD;  Location: [facility];  Service: Cardiovascular;   Laterality: N/A;  . SQUAMOUS CELL CARCINOMA EXCISION    . TONSILLECTOMY      Family History  Problem Relation Age of Onset  . Cancer Father        lung cancer  . Alzheimer's disease Mother     Social History   Social History  . Marital status: Widowed    Spouse name: N/A  . Number of children: 0  . Years of education: N/A   Occupational History  .  Retired   Social History Main Topics  . Smoking status: Former Smoker    Packs/day: 1.50    Years: 30.00    Types: Cigarettes    Quit date: 03/05/1978  . Smokeless tobacco: Never Used  . Alcohol use No  . Drug use: No  . Sexual activity: Not on file   Other Topics Concern  . Not on file   Social History Narrative  . No narrative on file    Current Outpatient Prescriptions  Medication Sig Dispense Refill  . acetaminophen (TYLENOL) 500 MG tablet Take 1,000 mg by mouth every 4 (four) hours as needed for moderate pain or headache.     Marland Kitchen amiodarone (PACERONE) 200 MG tablet Take 1/2 tablet (100 mg) by mouth daily (Patient taking differently: Take 100 mg by mouth daily. ) 90 tablet 3  . amLODipine (NORVASC) 2.5 MG tablet Take 1 tablet (2.5 mg total) by mouth daily. 30 tablet 6  . apixaban (ELIQUIS) 2.5 MG TABS tablet Take 1 tablet (2.5 mg total)  by mouth 2 (two) times daily. 60 tablet 11  . aspirin EC 81 MG tablet Take 81 mg by mouth daily.    . calcium carbonate (OS-CAL) 600 MG TABS Take 600 mg by mouth every evening.     . Cholecalciferol (VITAMIN D) 1000 UNITS capsule Take 1,000 Units by mouth daily.      . clopidogrel (PLAVIX) 75 MG tablet Take 1 tablet (75 mg total) by mouth daily. (Patient taking differently: Take 75 mg by mouth every evening. ) 90 tablet 3  . dicyclomine (BENTYL) 10 MG capsule Take 10 mg by mouth 2 (two) times daily as needed for spasms.   1  . furosemide (LASIX) 20 MG tablet Take 1 tablet (20 mg total) by mouth daily as needed (weight gain). (Patient taking differently: Take 20 mg by mouth daily as needed  (for fluid retention/weight gain.). ) 30 tablet 6  . levothyroxine (SYNTHROID, LEVOTHROID) 100 MCG tablet Take 100 mcg by mouth daily before breakfast.     . loratadine (CLARITIN) 10 MG tablet Take 10 mg by mouth daily as needed for allergies.    . metoprolol tartrate (LOPRESSOR) 50 MG tablet Take 50 mg by mouth 2 (two) times daily.    . Multiple Vitamins-Minerals (ICAPS AREDS 2) CAPS Take 1 capsule by mouth 2 (two) times daily.    . nitroGLYCERIN (NITROSTAT) 0.4 MG SL tablet Place 1 tablet (0.4 mg total) under the tongue every 5 (five) minutes as needed for chest pain. 25 tablet 11  . polyvinyl alcohol (LIQUIFILM TEARS) 1.4 % ophthalmic solution Place 1-2 drops into both eyes 3 (three) times daily as needed for dry eyes.    . rosuvastatin (CRESTOR) 10 MG tablet Take 1 tablet (10 mg total) by mouth daily. 90 tablet 3  . sodium chloride (OCEAN) 0.65 % SOLN nasal spray Place 1 spray into both nostrils as needed for congestion.     No current facility-administered medications for this visit.     Allergies  Allergen Reactions  . Penicillins Anaphylaxis    Has patient had a PCN reaction causing immediate rash, facial/tongue/throat swelling, SOB or lightheadedness with hypotension: Yes Has patient had a PCN reaction causing severe rash involving mucus membranes or skin necrosis: No Has patient had a PCN reaction that required hospitalization: No Has patient had a PCN reaction occurring within the last 10 years: No If all of the above answers are "NO", then may proceed with Cephalosporin use.   . Tetanus Toxoid Anaphylaxis  . Demerol Other (See Comments)    Severe nausea  . Codeine Nausea Only    Severe nausea      Review of Systems:   General:  normal appetite, decreased energy, no weight gain, no weight loss, no fever  Cardiac:  + chest pain with exertion, no chest pain at rest, + SOB with exertion, no resting SOB, no PND, no orthopnea, no palpitations, no arrhythmia, no atrial  fibrillation, no LE edema, + dizzy spells, no syncope  Respiratory:  + exertional shortness of breath, no home oxygen, + occasional productive cough, no dry cough, no bronchitis, no wheezing, no hemoptysis, no asthma, no pain with inspiration or cough, no sleep apnea, no CPAP at night  GI:   no difficulty swallowing, no reflux, no frequent heartburn, no hiatal hernia, no abdominal pain, no constipation, no diarrhea, no hematochezia, no hematemesis, no melena  GU:   no dysuria,  + frequency, no urinary tract infection, no hematuria, no kidney stones, no kidney disease  Vascular:  no pain suggestive of claudication, no pain in feet, no leg cramps, no varicose veins, no DVT, no non-healing foot ulcer  Neuro:   no stroke, no TIA's, no seizures, no headaches, no temporary blindness one eye,  no slurred speech, no peripheral neuropathy, no chronic pain, no instability of gait, no memory/cognitive dysfunction  Musculoskeletal: + arthritis particularly in knees, + joint swelling, no myalgias, no difficulty walking, normal mobility   Skin:   no rash, no itching, no skin infections, no pressure sores or ulcerations  Psych:   no anxiety, no depression, no nervousness, + unusual recent stress  Eyes:   + blurry vision, no floaters, no recent vision changes, + wears glasses or contacts  ENT:   no hearing loss, no loose or painful teeth, + partial dentures, last saw dentist December 2017  Hematologic:  + easy bruising, no abnormal bleeding, no clotting disorder, no frequent epistaxis  Endocrine:  no diabetes, does not check CBG's at home           Physical Exam:   BP 122/79   Pulse 63   Resp 16   Ht _0  (1.651 m)   Wt 126 lb (57.2 kg)   SpO2 98% Comment: ON RA  BMI 20.97 kg/m   General:  Thin,  well-appearing  HEENT:  Unremarkable   Neck:   no JVD, no bruits, no adenopathy   Chest:   clear to auscultation, symmetrical breath sounds, no wheezes, no rhonchi   CV:   RRR, grade IV/VI  crescendo/decrescendo murmur heard best at RUSB,  no diastolic murmur  Abdomen:  soft, non-tender, no masses   Extremities:  warm, well-perfused, pulses diminished but palpable, no LE edema  Rectal/GU  Deferred  Neuro:   Grossly non-focal and symmetrical throughout  Skin:   Clean and dry, no rashes, no breakdown   Diagnostic Tests:  Transthoracic Echocardiography  (Report amended )  Patient:    [name], [name] MR #:       [id number] Study Date: 06/20/2016 Gender:     F Age:        47 Height:     167.6 cm Weight:     58 kg BSA:        1.64 m^2 Pt. Status: Room:   ATTENDING    Peter Martinique, M.D.  ORDERING     Peter Martinique, M.D.  REFERRING    Peter Martinique, M.D.  SONOGRAPHER  Oletta Lamas, Will  PERFORMING   [facility]  cc:  ------------------------------------------------------------------- LV EF: 55% -   60%  ------------------------------------------------------------------- Indications:      (I35.0).  ------------------------------------------------------------------- History:   PMH:  Paroxysmal atrial fibrillation. Acquired from the patient and from the patient&'s chart.  Dyspnea.  Moderate aortic stenosis.  Risk factors:  Hypertension. Dyslipidemia.  ------------------------------------------------------------------- Study Conclusions  - Left ventricle: The cavity size was normal. There was mild   concentric hypertrophy. Systolic function was normal. The   estimated ejection fraction was in the range of 55% to 60%. Wall   motion was normal; there were no regional wall motion   abnormalities. - Aortic valve: Valve mobility was restricted. There was moderate   stenosis. There was moderate regurgitation. Peak velocity (S):   370 cm/s. Mean gradient (S): 30 mm Hg. Regurgitation pressure   half-time: 449 ms. - Mitral valve: Transvalvular velocity was within the normal range.   There was no evidence for stenosis. There was mild regurgitation. - Left  atrium: The atrium was moderately dilated. -  Right ventricle: The cavity size was normal. Wall thickness was   normal. Systolic function was normal. - Tricuspid valve: There was moderate regurgitation. - Pulmonary arteries: Systolic pressure was within the normal   range. PA peak pressure: 36 mm Hg (S).  Impressions:  - No significant changes since 08/2015.  ------------------------------------------------------------------- Labs, prior tests, procedures, and surgery: Permanent pacemaker system implantation.  ------------------------------------------------------------------- Study data:  Comparison was made to the study of 08/18/2015.  Study status:  Routine.  Procedure:  The patient reported no pain pre or post test. Transthoracic echocardiography for left ventricular function evaluation and for assessment of valvular function. Image quality was adequate.  Study completion:  There were no complications.          Transthoracic echocardiography.  M-mode, complete 2D, spectral Doppler, and color Doppler.  Birthdate: Patient birthdate: [birth year].  Age:  Patient is 83 yr old.  Sex: Gender: female.    BMI: 20.6 kg/m^2.  Blood pressure:     121/74 Patient status:  Outpatient.  Study date:  Study date: 06/20/2016. Study time: 10:40 AM.  Location:  [facility]  -------------------------------------------------------------------  ------------------------------------------------------------------- Left ventricle:  The cavity size was normal. There was mild concentric hypertrophy. Systolic function was normal. The estimated ejection fraction was in the range of 55% to 60%. Wall motion was normal; there were no regional wall motion abnormalities. The study was not technically sufficient to allow evaluation of LV diastolic dysfunction due to atrial fibrillation.  ------------------------------------------------------------------- Aortic valve:   Trileaflet; severely thickened,  severely calcified leaflets. Valve mobility was restricted.  Doppler:   There was moderate stenosis.   There was moderate regurgitation.    VTI ratio of LVOT to aortic valve: 0.19. Valve area (VTI): 0.6 cm^2. Indexed valve area (VTI): 0.37 cm^2/m^2. Peak velocity ratio of LVOT to aortic valve: 0.21. Valve area (Vmax): 0.66 cm^2. Indexed valve area (Vmax): 0.4 cm^2/m^2. Mean velocity ratio of LVOT to aortic valve: 0.19. Valve area (Vmean): 0.59 cm^2. Indexed valve area (Vmean): 0.36 cm^2/m^2.    Mean gradient (S): 30 mm Hg. Peak gradient (S): 55 mm Hg.  ------------------------------------------------------------------- Aorta:  Aortic root: The aortic root was normal in size.  ------------------------------------------------------------------- Mitral valve:   Structurally normal valve.   Mobility was not restricted.  Doppler:  Transvalvular velocity was within the normal range. There was no evidence for stenosis. There was mild regurgitation.    Peak gradient (D): 3 mm Hg.  ------------------------------------------------------------------- Left atrium:  The atrium was moderately dilated.  ------------------------------------------------------------------- Right ventricle:  The cavity size was normal. Wall thickness was normal. Systolic function was normal.  ------------------------------------------------------------------- Pulmonic valve:    Structurally normal valve.   Cusp separation was normal.  Doppler:  Transvalvular velocity was within the normal range. There was no evidence for stenosis. There was mild regurgitation.  ------------------------------------------------------------------- Tricuspid valve:   Structurally normal valve.    Doppler: Transvalvular velocity was within the normal range. There was moderate regurgitation.  ------------------------------------------------------------------- Pulmonary artery:   The main pulmonary artery was  normal-sized. Systolic pressure was within the normal range.  ------------------------------------------------------------------- Right atrium:  The atrium was normal in size.  ------------------------------------------------------------------- Pericardium:  There was no pericardial effusion.  ------------------------------------------------------------------- Systemic veins: Inferior vena cava: The vessel was dilated. The respirophasic diameter changes were in the normal range (= 50%), consistent with elevated central venous pressure.  ------------------------------------------------------------------- Measurements   Left ventricle  Value          Reference  LV ID, ED, PLAX chordal           (L)     37.4  mm       43 - 52  LV ID, ES, PLAX chordal                   25.6  mm       23 - 38  LV fx shortening, PLAX chordal            32    %        >=29  LV PW thickness, ED                       10    mm       ---------  IVS/LV PW ratio, ED                       1.15           <=1.3  Stroke volume, 2D                         57    ml       ---------  Stroke volume/bsa, 2D                     35    ml/m^2   ---------  LV e&', lateral                            7.9   cm/s     ---------  LV E/e&', lateral                          11.16          ---------  LV e&', medial                             6.34  cm/s     ---------  LV E/e&', medial                           13.91          ---------  LV e&', average                            7.12  cm/s     ---------  LV E/e&', average                          12.39          ---------    Ventricular septum                        Value          Reference  IVS thickness, ED                         11.5  mm       ---------    LVOT  Value          Reference  LVOT ID, S                                20    mm       ---------  LVOT area                                 3.14  cm^2      ---------  LVOT ID                                   20    mm       ---------  LVOT peak velocity, S                     77.5  cm/s     ---------  LVOT mean velocity, S                     48.5  cm/s     ---------  LVOT VTI, S                               18    cm       ---------  LVOT peak gradient, S                     2     mm Hg    ---------  Stroke volume (SV), LVOT DP               56.5  ml       ---------  Stroke index (SV/bsa), LVOT DP            34.5  ml/m^2   ---------    Aortic valve                              Value          Reference  Aortic valve peak velocity, S             370   cm/s     ---------  Aortic valve mean velocity, S             257   cm/s     ---------  Aortic valve VTI, S                       93.9  cm       ---------  Aortic mean gradient, S                   30    mm Hg    ---------  Aortic peak gradient, S                   55    mm Hg    ---------  VTI ratio, LVOT/AV                        0.19           ---------  Aortic valve area, VTI  0.6   cm^2     ---------  Aortic valve area/bsa, VTI                0.37  cm^2/m^2 ---------  Velocity ratio, peak, LVOT/AV             0.21           ---------  Aortic valve area, peak velocity          0.66  cm^2     ---------  Aortic valve area/bsa, peak               0.4   cm^2/m^2 ---------  velocity  Velocity ratio, mean, LVOT/AV             0.19           ---------  Aortic valve area, mean velocity          0.59  cm^2     ---------  Aortic valve area/bsa, mean               0.36  cm^2/m^2 ---------  velocity  Aortic regurg pressure half-time          449   ms       ---------    Aorta                                     Value          Reference  Aortic root ID, ED                        33    mm       ---------  Ascending aorta ID, A-P, S                31    mm       ---------    Left atrium                               Value          Reference  LA ID, A-P, ES                             39    mm       ---------  LA ID/bsa, A-P                    (H)     2.38  cm/m^2   <=2.2  LA volume, S                              63    ml       ---------  LA volume/bsa, S                          38.4  ml/m^2   ---------  LA volume, ES, 1-p A4C                    62    ml       ---------  LA volume/bsa, ES, 1-p A4C  37.8  ml/m^2   ---------  LA volume, ES, 1-p A2C                    59    ml       ---------  LA volume/bsa, ES, 1-p A2C                36    ml/m^2   ---------    Mitral valve                              Value          Reference  Mitral E-wave peak velocity               88.2  cm/s     ---------  Mitral A-wave peak velocity               74    cm/s     ---------  Mitral deceleration time                  201   ms       150 - 230  Mitral peak gradient, D                   3     mm Hg    ---------  Mitral E/A ratio, peak                    1.2            ---------    Pulmonary arteries                        Value          Reference  PA pressure, S, DP                (H)     36    mm Hg    <=30    Tricuspid valve                           Value          Reference  Tricuspid regurg peak velocity            266   cm/s     ---------  Tricuspid peak RV-RA gradient             28    mm Hg    ---------    Systemic veins                            Value          Reference  Estimated CVP                             8     mm Hg    ---------    Right ventricle                           Value          Reference  RV pressure, S, DP                (H)     36  mm Hg    <=30  RV s&', lateral, S                         9.07  cm/s     ---------  Legend: (L)  and  (H)  mark values outside specified reference range.  ------------------------------------------------------------------- Neena Rhymes, MD [id number]:28:41    Transthoracic Echocardiography  Patient:    [name], [name] MR #:       [id number] Study Date: 12/26/2016 Gender:      F Age:        5 Height:     167.6 cm Weight:     56.7 kg BSA:        1.62 m^2 Pt. Status: Room:   PERFORMING   [facility]  SONOGRAPHER  [facility], [facility]  ATTENDING    Nell Range  Riley Kill, Katie  Sharl Ma, Katie  cc:  ------------------------------------------------------------------- LV EF: 60% -   65%  ------------------------------------------------------------------- Indications:      Aortic Stenosis (I35.0).  ------------------------------------------------------------------- History:   PMH:   Chest pain.  Dyspnea.  Atrial fibrillation. Coronary artery disease.  Aortic valve disease.  Risk factors: Aortic Stenosis, Work up for TAVR, Sick Sinus Syndrome, Chronic Kidney Disease. Former tobacco use. Hypertension. Dyslipidemia.  ------------------------------------------------------------------- Study Conclusions  - Left ventricle: The cavity size was normal. Wall thickness was   normal. Systolic function was normal. The estimated ejection   fraction was in the range of 60% to 65%. Wall motion was normal;   there were no regional wall motion abnormalities. Features are   consistent with a pseudonormal left ventricular filling pattern,   with concomitant abnormal relaxation and increased filling   pressure (grade 2 diastolic dysfunction). - Aortic valve: Moderately to severely calcified annulus.   Moderately thickened, moderately calcified leaflets. There was   moderate stenosis. There was mild regurgitation. Valve area   (VTI): 0.88 cm^2. Valve area (Vmax): 0.78 cm^2. Valve area   (Vmean): 0.74 cm^2. - Mitral valve: Calcified annulus. - Right atrium: The atrium was mildly dilated.  ------------------------------------------------------------------- Labs, prior tests, procedures, and surgery: Permanent pacemaker system implantation.  Permanent pacemaker system  implantation.  ------------------------------------------------------------------- Study data:  Comparison was made to the study of 06/20/2016.  Study status:  Routine.  Procedure:  Transthoracic echocardiography. Image quality was adequate.          Transthoracic echocardiography.  M-mode, complete 2D, 3D, spectral Doppler, and color Doppler.  Birthdate:  Patient birthdate: [birth year].  Age: Patient is 83 yr old.  Sex:  Gender: female.    BMI: 20.2 kg/m^2. Blood pressure:     143/82  Patient status:  Outpatient.  Study date:  Study date: 12/26/2016. Study time: 02:11 PM.  Location: [place] [facility]  -------------------------------------------------------------------  ------------------------------------------------------------------- Left ventricle:  Global longitudinal strain = -17.1%.  The cavity size was normal. Wall thickness was normal. Systolic function was normal. The estimated ejection fraction was in the range of 60% to 65%. Wall motion was normal; there were no regional wall motion abnormalities. Features are consistent with a pseudonormal left ventricular filling pattern, with concomitant abnormal relaxation and increased filling pressure (grade 2 diastolic dysfunction).  ------------------------------------------------------------------- Aortic valve:   Moderately to severely calcified annulus. Moderately thickened, moderately calcified leaflets.  Doppler: There was moderate stenosis.   There was mild regurgitation.    VTI ratio of LVOT to aortic valve: 0.28. Valve area (VTI): 0.88  cm^2. Indexed valve area (VTI): 0.54 cm^2/m^2. Peak velocity ratio of LVOT to aortic valve: 0.25. Valve area (Vmax): 0.78 cm^2. Indexed valve area (Vmax): 0.48 cm^2/m^2. Mean velocity ratio of LVOT to aortic valve: 0.24. Valve area (Vmean): 0.74 cm^2. Indexed valve area (Vmean): 0.46 cm^2/m^2.    Mean gradient (S): 28 mm Hg. Peak gradient (S): 53 mm  Hg.  ------------------------------------------------------------------- Aorta:  Aortic root: The aortic root was normal in size. Ascending aorta: The ascending aorta was normal in size.  ------------------------------------------------------------------- Mitral valve:   Calcified annulus.  Doppler:  There was no significant regurgitation.    Peak gradient (D): 3 mm Hg.  ------------------------------------------------------------------- Left atrium:  The atrium was normal in size.  ------------------------------------------------------------------- Right ventricle:  The cavity size was normal. Pacer wire or catheter noted in right ventricle. Systolic function was normal.   ------------------------------------------------------------------- Pulmonic valve:   Poorly visualized.  Doppler:  There was no significant regurgitation.  ------------------------------------------------------------------- Tricuspid valve:   Mildly thickened leaflets.  Doppler:  There was mild regurgitation.  ------------------------------------------------------------------- Pulmonary artery:   Systolic pressure was within the normal range.   ------------------------------------------------------------------- Right atrium:  The atrium was mildly dilated.  ------------------------------------------------------------------- Pericardium:  There was no pericardial effusion.  ------------------------------------------------------------------- Systemic veins: Inferior vena cava: The vessel was normal in size. The respirophasic diameter changes were in the normal range (>= 50%), consistent with normal central venous pressure.  ------------------------------------------------------------------- Measurements   Left ventricle                           Value          Reference  LV ID, ED, PLAX chordal           (L)    33    mm       43 - 52  LV ID, ES, PLAX chordal           (L)    22.1  mm       23  - 38  LV fx shortening, PLAX chordal           33    %        >=29  LV PW thickness, ED                      10.7  mm       ----------  IVS/LV PW ratio, ED                      0.86           <=1.3  Stroke volume, 2D                        75    ml       ----------  Stroke volume/bsa, 2D                    46    ml/m^2   ----------  LV e&', lateral                           5.87  cm/s     ----------  LV E/e&', lateral                         15.78          ----------  LV e&', medial                            5.33  cm/s     ----------  LV E/e&', medial                          17.37          ----------  LV e&', average                           5.6   cm/s     ----------  LV E/e&', average                         16.54          ----------  Longitudinal strain, TDI                 17    %        ----------    Ventricular septum                       Value          Reference  IVS thickness, ED                        9.17  mm       ----------    LVOT                                     Value          Reference  LVOT ID, S                               20    mm       ----------  LVOT area                                3.14  cm^2     ----------  LVOT peak velocity, S                    90.2  cm/s     ----------  LVOT mean velocity, S                    56.7  cm/s     ----------  LVOT VTI, S                              24    cm       ----------    Aortic valve                             Value          Reference  Aortic valve peak velocity, S            364   cm/s     ----------  Aortic valve mean velocity, S            241  cm/s     ----------  Aortic valve VTI, S                      85.8  cm       ----------  Aortic mean gradient, S                  28    mm Hg    ----------  Aortic peak gradient, S                  53    mm Hg    ----------  VTI ratio, LVOT/AV                       0.28           ----------  Aortic valve area, VTI                   0.88  cm^2     ----------  Aortic  valve area/bsa, VTI               0.54  cm^2/m^2 ----------  Velocity ratio, peak, LVOT/AV            0.25           ----------  Aortic valve area, peak velocity         0.78  cm^2     ----------  Aortic valve area/bsa, peak              0.48  cm^2/m^2 ----------  velocity  Velocity ratio, mean, LVOT/AV            0.24           ----------  Aortic valve area, mean velocity         0.74  cm^2     ----------  Aortic valve area/bsa, mean              0.46  cm^2/m^2 ----------  velocity  Aortic regurg pressure half-time         482   ms       ----------    Aorta                                    Value          Reference  Aortic root ID, ED                       29    mm       ----------  Ascending aorta ID, A-P, S               30    mm       ----------    Left atrium                              Value          Reference  LA ID, A-P, ES                           38    mm       ----------  LA ID/bsa, A-P                    (  H)    2.34  cm/m^2   <=2.2  LA volume, S                             50.8  ml       ----------  LA volume/bsa, S                         31.3  ml/m^2   ----------  LA volume, ES, 1-p A4C                   53.1  ml       ----------  LA volume/bsa, ES, 1-p A4C               32.8  ml/m^2   ----------  LA volume, ES, 1-p A2C                   43.8  ml       ----------  LA volume/bsa, ES, 1-p A2C               27    ml/m^2   ----------    Mitral valve                             Value          Reference  Mitral E-wave peak velocity              92.6  cm/s     ----------  Mitral A-wave peak velocity              87.8  cm/s     ----------  Mitral deceleration time                 218   ms       150 - 230  Mitral peak gradient, D                  3     mm Hg    ----------  Mitral E/A ratio, peak                   1.1            ----------  Mitral regurg VTI, PISA                  233   cm       ----------    Tricuspid valve                          Value          Reference   Tricuspid regurg peak velocity           262   cm/s     ----------  Tricuspid peak RV-RA gradient            27    mm Hg    ----------    Right atrium                             Value          Reference  RA ID, S-I, ES, A4C               (H)    53.7  mm       34 - 49  RA area, ES, A4C                         19    cm^2     8.3 - 19.5  RA volume, ES, A/L                       55    ml       ----------  RA volume/bsa, ES, A/L                   33.9  ml/m^2   ----------    Right ventricle                          Value          Reference  TAPSE                                    27.4  mm       ----------  RV s&', lateral, S                        10.6  cm/s     ----------  Legend: (L)  and  (H)  mark values outside specified reference range.  ------------------------------------------------------------------- Prepared and Electronically Authenticated by  Mertie Moores, M.D. [id number]:48:44   RIGHT/LEFT HEART CATH AND CORONARY ANGIOGRAPHY  Conclusion     Mid LAD lesion, 30 %stenosed.  Mid LAD to Dist LAD lesion, 85 %stenosed.  Prox RCA to Mid RCA lesion, 20 %stenosed.  Dist RCA lesion, 30 %stenosed.  Ost LM lesion, 10 %stenosed.  LV end diastolic pressure is normal.  LV end diastolic pressure is normal.   1. Single vessel obstructive CAD with 85% mid LAD stenosis 2. Severe aortic stenosis. Mean gradient of 40 mm Hg. AVA 0.61 cm squared with index .37. 3. Normal cardiac output. 4. Normal right heart and LV filling pressures  Plan: will discuss options for AVR. I think her best option based on age and co-morbidities would be TAVR with stenting of mid LAD. Will discuss with heart valve team.   Indications   Nonrheumatic aortic valve stenosis [I35.0 (ICD-10-CM)]  Procedural Details/Technique   Technical Details Indication: 83 yo WF with progressive dyspnea on exertion and aortic stenosis  Procedural Details: The right wrist was prepped, draped, and  anesthetized with 1% lidocaine. Using the modified Seldinger technique a 6 Fr slender sheath was placed in the right radial artery and a 5 French sheath was placed in the right brachial vein. We were unable to pass the Swan- Ganz catheter from the right brachial approach due to inability to pass catheter past the old pacing wires. The right groin was prepped, draped and anesthetized with 1% lidocaine. A 7 Fr sheath was placed percutaneously in the right femoral vein. A Swan-Ganz catheter was used for the right heart catheterization. Standard protocol was followed for recording of right heart pressures and sampling of oxygen saturations. Fick cardiac output was calculated. Standard Judkins catheters were used for selective coronary angiography and left ventricular pressures. There were no immediate procedural complications. The patient was transferred to the post catheterization recovery area for further monitoring.  Contrast: 55 cc   Estimated blood loss <50 mL.  During this procedure the patient was administered the following to achieve and maintain moderate conscious sedation: Versed 1 mg, Fentanyl 25 mcg, while the patient's heart rate, blood pressure, and oxygen saturation were continuously monitored. The period of conscious sedation was 50 minutes, of which I was present face-to-face 100% of this time.    Complications   Complications documented before study signed (11/27/2016 11:04 AM EDT)    No complications were associated with this study.  Documented by Martinique, Peter M, MD - 11/27/2016 11:04 AM EDT    Coronary Findings   Dominance: Right  Left Main  Ost LM lesion, 10% stenosed. The lesion is moderately calcified.  Left Anterior Descending  Mid LAD lesion, 30% stenosed. The lesion is moderately calcified.  Mid LAD to Dist LAD lesion, 85% stenosed.  Left Circumflex  Vessel is small.  Right Coronary Artery  Prox RCA to Mid RCA lesion, 20% stenosed.  Dist RCA lesion, 30% stenosed.    Right Heart   Right Heart Pressures LV EDP is normal.    Left Heart   Left Ventricle LV end diastolic pressure is normal.    Coronary Diagrams   Diagnostic Diagram       Implants     No implant documentation for this case.  MERGE Images   Show images for CARDIAC CATHETERIZATION   Link to Procedure Log   Procedure Log    Hemo Data    Most Recent Value  Fick Cardiac Output 3.62 L/min  Fick Cardiac Output Index 2.22 (L/min)/BSA  Aortic Mean Gradient 39.6 mmHg  Aortic Peak Gradient 38 mmHg  Aortic Valve Area 0.61  Aortic Value Area Index 0.37 cm2/BSA  RA A Wave 9 mmHg  RA V Wave 9 mmHg  RA Mean 6 mmHg  RV Systolic Pressure 34 mmHg  RV Diastolic Pressure 3 mmHg  RV EDP 10 mmHg  PA Systolic Pressure 32 mmHg  PA Diastolic Pressure 11 mmHg  PA Mean 20 mmHg  PW A Wave 18 mmHg  PW V Wave 23 mmHg  PW Mean 16 mmHg  AO Systolic Pressure 161 mmHg  AO Diastolic Pressure 71 mmHg  AO Mean 096 mmHg  LV Systolic Pressure 045 mmHg  LV Diastolic Pressure 5 mmHg  LV EDP 21 mmHg  Arterial Occlusion Pressure Extended Systolic Pressure 409 mmHg  Arterial Occlusion Pressure Extended Diastolic Pressure 67 mmHg  Arterial Occlusion Pressure Extended Mean Pressure 103 mmHg  Left Ventricular Apex Extended Systolic Pressure 811 mmHg  Left Ventricular Apex Extended Diastolic Pressure 3 mmHg  Left Ventricular Apex Extended EDP Pressure 22 mmHg  QP/QS 1  TPVR Index 9 HRUI  TSVR Index 48.14 HRUI  PVR SVR Ratio 0.04  TPVR/TSVR Ratio 0.19    CORONARY STENT INTERVENTION  Conclusion     Mid LAD to Dist LAD lesion, 85 %stenosed.  A STENT PROMUS PREM MR 2.75X16 drug eluting stent was successfully placed.  Post intervention, there is a 0% residual stenosis.   1. Successful stenting of the mid LAD with DES  Plan: IV hydration post procedure. Follow renal function closely. Continue DAPT for one month then discontinue ASA. May start Eliquis tomorrow if no bleeding. Referral for  TAVR evaluation.   Indications   Dyspnea on exertion [R06.09 (ICD-10-CM)]  Procedural Details/Technique   Technical Details Indication: 83 yo WF with progressive dyspnea on exertion. She has severe AS and single vessel CAD with 85% mid LAD stenosis. Plan to treat LAD percutaneously then refer for TAVR.  Procedural Details: The right  wrist was prepped, draped, and anesthetized with 1% lidocaine. Using the modified Seldinger technique, a 6 Fr slender sheath was introduced into the radial artery. 3 mg verapamil was administered through the radial sheath. Weight-based heparin was given for anticoagulation. Once a therapeutic ACT was achieved, a 6 [place] XBLAD 3.5 guide catheter was inserted. A prowater coronary guidewire was used to cross the lesion. The lesion was predilated with a 2.5 mm balloon. The lesion was then stented with a 2.75 x 16 mm Promus premier stent. The stent was postdilated with a 2.75 noncompliant balloon to 18 atm. Following PCI, there was 0% residual stenosis and TIMI-3 flow. Final angiography confirmed an excellent result. The patient tolerated the procedure well. There were no immediate procedural complications. A TR band was used for radial hemostasis. The patient was transferred to the post catheterization recovery area for further monitoring.  Contrast: 80 cc     Estimated blood loss <50 mL.  During this procedure the patient was administered the following to achieve and maintain moderate conscious sedation: Versed 1 mg, Fentanyl 25 mcg, while the patient's heart rate, blood pressure, and oxygen saturation were continuously monitored. The period of conscious sedation was 44 minutes, of which I was present face-to-face 100% of this time.    Complications   Complications documented before study signed (12/20/2016 12:11 PM EDT)    No complications were associated with this study.  Documented by [place]ique, Peter M, MD - 12/20/2016 12:08 PM EDT    Coronary Findings    Dominance: Right  Left Main  Ost LM lesion, 10% stenosed. The lesion is moderately calcified.  Left Anterior Descending  Mid LAD lesion, 30% stenosed. The lesion is moderately calcified.  Mid LAD to Dist LAD lesion, 85% stenosed.  Angioplasty: Lesion crossed with guidewire using a WIRE ASAHI PROWATER 180CM. Pre-stent angioplasty was performed using a BALLOON SAPPHIRE 2.5X12. A STENT PROMUS PREM MR 2.75X16 drug eluting stent was successfully placed. Stent strut is well apposed. Post-stent angioplasty was performed using a BALLOON SAPPHIRE [employer] 2.75X10. Maximum pressure: 18 atm. The pre-interventional distal flow is normal (TIMI 3). The post-interventional distal flow is normal (TIMI 3). The intervention was successful . No complications occurred at this lesion.  There is no residual stenosis post intervention.  Left Circumflex  Vessel is small.  Right Coronary Artery  Prox RCA to Mid RCA lesion, 20% stenosed.  Dist RCA lesion, 30% stenosed.  Coronary Diagrams   Diagnostic Diagram       Post-Intervention Diagram         Cardiac TAVR CT  TECHNIQUE: The patient was scanned on a Siemens 192 scanner. A 120 kV retrospective scan was triggered in the Ascending thoracic aorta at 140 HU's. Gantry rotation speed was 250 msecs and collimation was .6 mm. No beta blockade or nitro were given. The 3D data set was reconstructed in 5% intervals of the R-R cycle. Systolic and diastolic phases were analyzed on a dedicated work station using MPR, MIP and VRT modes. The patient received 80 cc of contrast. Due to CRF a bicarbonate infusion was given before study  FINDINGS: Aortic Valve: Tri leaflet and heavily calcified with restricted systolic motion  Aorta: Marked calcific atherosclerosis of the arch, left subclavian and descending thoracic aorta  Sinotubular Junction:  24 mm  Ascending Thoracic Aorta:  32 mm  Aortic Arch:  26 mm  Descending Thoracic Aorta:  26 mm  Sinus of  Valsalva Measurements:  Non-coronary:  29 mm  Right -coronary:  28  mm  Left -coronary:  29 mm  Coronary Artery Height above Annulus:  Left Main:  11.5 mm above annulus  Right Coronary:  10.1 mm above annulus  Virtual Basal Annulus Measurements:  Maximum/Minimum Diameter:  24 mm x 20.8 mm  Perimeter:  71 mm  Area:  392 mm 2  Coronary Arteries: Sufficient height above annulus for deployment patent stent in mid LAD Although RCA origin is a bit shallow  Optimum Fluoroscopic Angle for Delivery: LAO 33 degrees Caudal 4 degrees  IMPRESSION: 1) Calcified tri leaflet aortic valve with annular area of 392 mm 2 suitable for a 23 mm Sapiens 3 valve and perimeter of 71 mm suitable for a 26 mm Evolut Pro valve  2) Coronary arteries sufficient height above annulus for deployment although RCA is a bit shallow  3) Marked calcific atherosclerosis of the aortic arch, left subclavian origin and descending thoracic aorta  4) Optimum angiographic angle for deployment LAO 33 degrees Caudal 4 degrees  5) No LAA thrombus  6) Pacing wires seen in RA/RV  7) Patient stent in mid LAD  Jenkins Rouge   Electronically Signed   By: Jenkins Rouge M.D.   On: 12/31/2016 17:49   CT ANGIOGRAPHY CHEST, ABDOMEN AND PELVIS  TECHNIQUE: Multidetector CT imaging through the chest, abdomen and pelvis was performed using the standard protocol during bolus administration of intravenous contrast. Multiplanar reconstructed images and MIPs were obtained and reviewed to evaluate the vascular anatomy.  CONTRAST:  60 cc Isovue 370 IV.  COMPARISON:  06/19/2016 CT abdomen/pelvis. 10/10/2016 chest radiograph.  FINDINGS: CTA CHEST FINDINGS  Cardiovascular: Mild cardiomegaly. No significant pericardial fluid/thickening. Left main, left anterior descending and right coronary artery calcifications. Abandoned 2 lead right subclavian pacer leads are noted with lead tips in the  right atrium and right ventricle. Two lead left subclavian pacemaker is noted with lead tips in the right atrium and right ventricular apex. Atherosclerotic nonaneurysmal thoracic aorta. No evidence of aortic dissection, acute intramural hematoma, pseudoaneurysm or penetrating atherosclerotic ulcer. Main pulmonary artery diameter 3.0 cm, top-normal. No central pulmonary emboli. Severe thickening and calcification of the aortic valve.  Mediastinum/Nodes: No discrete thyroid nodules. Unremarkable esophagus. No pathologically enlarged axillary, mediastinal or hilar lymph nodes.  Lungs/Pleura: No pneumothorax. No pleural effusion. There is mild scattered cylindrical and varicoid bronchiectasis throughout both lungs, most prominent in the medial segment right middle lobe and lingula. There is associated diffuse bronchial wall thickening and scattered mild tree-in-bud opacities throughout both lungs. Subpleural bandlike opacities in the medial segment right middle lobe and inferior segment lingula with associated mild volume loss and distortion are compatible with mild postinfectious scarring. No acute consolidative airspace disease or lung masses.  Musculoskeletal: No aggressive appearing focal osseous lesions. Moderate lower thoracic spondylosis.  CTA ABDOMEN AND PELVIS FINDINGS  Hepatobiliary: Normal liver with no liver mass. Normal gallbladder with no radiopaque cholelithiasis. No biliary ductal dilatation.  Pancreas: Normal, with no mass or duct dilation.  Spleen: Normal size. No mass.  Adrenals/Urinary Tract: Normal adrenals. Simple 0.8 cm renal cyst in the interpolar left kidney. Otherwise normal kidneys, with no overt hydronephrosis. Normal bladder.  Stomach/Bowel: Grossly normal stomach. Normal caliber small bowel with no small bowel wall thickening. Normal appendix. Moderate sigmoid diverticulosis, no large bowel wall thickening or pericolonic fat  stranding.  Vascular/Lymphatic: Atherosclerotic nonaneurysmal abdominal aorta. Patent splenic and renal veins. No pathologically enlarged lymph nodes in the abdomen or pelvis.  Reproductive: Status post hysterectomy, with no abnormal findings at the vaginal cuff.  No adnexal mass.  Other: No pneumoperitoneum, ascites or focal fluid collection.  Musculoskeletal: No aggressive appearing focal osseous lesions. Moderate lumbar spondylosis, most prominent at the superior L1 endplate.  VASCULAR MEASUREMENTS PERTINENT TO TAVR:  AORTA:  Minimal Aortic Diameter -  10.9 mm  Severity of Aortic Calcification -  moderate to severe  RIGHT PELVIS:  Right Common Iliac Artery -  Minimal Diameter - 8.9 x 6.0 mm  Tortuosity - mild  Calcification - moderate  Right External Iliac Artery -  Minimal Diameter - 7.5 x 6.3 mm  Tortuosity - mild-to-moderate  Calcification - none  Right Common Femoral Artery -  Minimal Diameter - 7.7 x 5.7 mm  Tortuosity - mild  Calcification - mild-to-moderate  LEFT PELVIS:  Left Common Iliac Artery -  Minimal Diameter - 7.7 x 6.2 mm  Tortuosity - mild  Calcification - moderate  Left External Iliac Artery -  Minimal Diameter - 7.1 x 6.4 mm  Tortuosity - mild-to-moderate  Calcification - none  Left Common Femoral Artery -  Minimal Diameter - 8.4 x 6.1 mm  Tortuosity - mild  Calcification - mild-to-moderate  Review of the MIP images confirms the above findings.  IMPRESSION: 1. Vascular findings and measurements burden and to potential TAVR procedure, as detailed above. 2. Severe thickening and calcification of the aortic valve, compatible with the reported clinical history of severe aortic stenosis. 3. Spectrum of findings most compatible with chronic infectious bronchiolitis due to atypical mycobacterial infection (MAI), with mild cylindrical and varicoid bronchiectasis, mild  tree-in-bud opacities and mild postinfectious scarring throughout both lungs, most prominent in the mid lungs. 4. Moderate sigmoid diverticulosis. 5.  Aortic Atherosclerosis (ICD10-I70.0).   Electronically Signed   By: Ilona Sorrel M.D.   On: 01/01/2017 09:49   STS Risk Calculator  Procedure: AV Replacement  Risk of Mortality: 8.2%  Morbidity or Mortality: 30.941%  Long Length of Stay: 16.601%  Short Length of Stay: 12.099%  Permanent Stroke: 2.967%  Prolonged Ventilation: 23.364%  DSW Infection: 0.181%  Renal Failure: 8.399%  Reoperation: 10.982%    Procedure: AV Replacement + CAB  Risk of Mortality: 9.114%  Morbidity or Mortality: 36.184%  Long Length of Stay: 22.403%  Short Length of Stay: 8.31%  Permanent Stroke: 3.032%  Prolonged Ventilation: 25.651%  DSW Infection: 0.247%  Renal Failure: 12.107%  Reoperation: 12.802%     Impression:  Patient has stage D severe symptomatic aortic stenosis and single-vessel coronary artery disease. She presents with symptoms of exertional shortness of breath and chest discomfort consistent with chronic diastolic congestive heart failure and stable angina pectoris, New York Heart Association functional class II.  I have personally reviewed the patient's most recent transthoracic echocardiogram, diagnostic cardiac catheterization, and CT angiograms. Echocardiogram demonstrated the presence of aortic stenosis with severe thickening, calcification, and restricted leaflet mobility involving all 3 leaflets of the aortic valve. Peak velocity across the aortic valve measured 3.7 m/s corresponding to mean transvalvular gradient estimated 30 mmHg. The DVI was quite low and mean transvalvular gradient at catheterization was directly measured 40 mmHg corresponding to aortic valve area calculated only 0.61 cm. Left ventricular systolic function remains normal. The patient subsequently underwent PCI and stenting of the left anterior descending  coronary artery with angiographically good result. She otherwise has only mild nonobstructive coronary artery disease and her symptoms have persisted. I agree that aortic valve replacement using indicated. Risks associated with conventional surgery would be at least moderately elevated because of the patient's advanced age and comorbid medical  problems.  Cardiac-gated CTA of the heart reveals anatomical characteristics consistent with aortic stenosis suitable for treatment by transcatheter aortic valve replacement without any significant complicating features and CTA of the aorta and iliac vessels demonstrate what appears to be adequate pelvic vascular access to facilitate a transfemoral approach.    Plan:  The patient and her sister-in-law were counseled at length regarding treatment alternatives for management of severe symptomatic aortic stenosis. Alternative approaches such as conventional aortic valve replacement, transcatheter aortic valve replacement, and palliative medical therapy were compared and contrasted at length.  The risks associated with conventional surgical aortic valve replacement were been discussed in detail, as were expectations for post-operative convalescence.  Issues specific to transcatheter aortic valve replacement were discussed including questions about long term valve durability, the potential for paravalvular leak, possible increased risk of need for permanent pacemaker placement, and other technical complications related to the procedure itself.  Long-term prognosis with medical therapy was discussed. This discussion was placed in the context of the patient's own specific clinical presentation and past medical history.  All of their questions been addressed.  The patient hopes to proceed with transcatheter aortic valve replacement in the near future. We tentatively plan to proceed on 01/29/2017. The patient has been instructed to stop taking Eliquis 7 days prior to surgery. She  will remain on aspirin and Plavix until the time of surgery. Between now and the time of surgery she will be referred for a second surgical consultation.  Expectations for her recovery following transcatheter aortic valve replacement have been discussed.  All of her questions been addressed.   I spent in excess of 90 minutes during the conduct of this office consultation and >50% of this time involved direct face-to-face encounter with the patient for counseling and/or coordination of their care.   Valentina Gu. Roxy Manns, MD 01/02/2017 3:24 PM

## 2017-01-02 NOTE — Progress Notes (Signed)
[name] [name] Date of Birth: 06-27-1929 Medical Record #[id number]  History of Present Illness: [name] [name] is seen today for follow up s/p recent PCI with DES to LAD.  She has a history of paroxysmal atrial fibrillation with tachy-brady syndrome and is s/p pacemaker implant. She is on chronic anticoagulation with Xarelto. Echo in July 2015 showed moderate aortic stenosis that had progressed since 2012. Repeat in July 2016 and July 2017 showed no change. In April 2017 she had atrial fibrillation that converted spontaneously. This was felt to be triggered by a steroid injection. Her metoprolol was increased at that time. When seen in June 2017 she was noted to be in persistent Afib. We placed her on Flecainide but this was later stopped due to side effects. She was seen in November with recurrent and persistent Afib. Rate was controlled but she was symptomatic. Seen in Afib clinic and started on amiodarone with subsequent conversion to NSR. Ecg in January showed NSR.  When last see she complained  of  SOB with activity- this was present even before amiodarone and hasn't improved with restoration of NSR.  She states that any walking or using her arms makes her SOB. No edema.  She does describe a sinking feeling and discomfort  in her epigastrium that makes her feel very weak. She did have a upper and lower EGD which was negative except for few polyps. She had PFTs in December that showed mild obstructive defect with marked decrease in diffusion capacity. CT of the Abdomen was negative for any pathology. She reports Dr. Virgina Jock did an [place] as well. Her last Echo was in April as noted below. Myoview study was normal. When seen by Dr. [name] in late July it was noted that she had failure of her RA lead. Her device was reprogrammed but she developed pectoral stimulation. She underwent explantation of her old generator and placement of a new left chest dual chamber pacemaker on August 7. Because of her ongoing  symptoms we recommended a right and left heart cath and this was done on 11/27/16. This demonstrated severe AS as well as a severe stenosis in the mid LAD. She subsequently underwent successful PCI of the LAD with DES on 12/20/16 with 2.75 x 16 mm Promus stent.   On follow up she is doing ok.  She has no complications from her PCI. She does note that the terrible sensations she was getting in the epigastric area have improved and she is able to get up and down stairs better. She still has significant dyspnea walking any distance or doing housework.   Current Outpatient Medications on File Prior to Visit  Medication Sig Dispense Refill  . acetaminophen (TYLENOL) 500 MG tablet Take 1,000 mg by mouth every 4 (four) hours as needed for moderate pain or headache.     Marland Kitchen amiodarone (PACERONE) 200 MG tablet Take 1/2 tablet (100 mg) by mouth daily (Patient taking differently: Take 100 mg by mouth daily. ) 90 tablet 3  . amLODipine (NORVASC) 2.5 MG tablet Take 1 tablet (2.5 mg total) by mouth daily. 30 tablet 6  . apixaban (ELIQUIS) 2.5 MG TABS tablet Take 1 tablet (2.5 mg total) by mouth 2 (two) times daily. 60 tablet 11  . aspirin EC 81 MG tablet Take 81 mg by mouth daily.    . calcium carbonate (OS-CAL) 600 MG TABS Take 600 mg by mouth every evening.     . Cholecalciferol (VITAMIN D) 1000 UNITS capsule Take 1,000 Units by  mouth daily.      . clopidogrel (PLAVIX) 75 MG tablet Take 1 tablet (75 mg total) by mouth daily. (Patient taking differently: Take 75 mg by mouth every evening. ) 90 tablet 3  . dicyclomine (BENTYL) 10 MG capsule Take 10 mg by mouth 2 (two) times daily as needed for spasms.   1  . furosemide (LASIX) 20 MG tablet Take 1 tablet (20 mg total) by mouth daily as needed (weight gain). (Patient taking differently: Take 20 mg by mouth daily as needed (for fluid retention/weight gain.). ) 30 tablet 6  . levothyroxine (SYNTHROID, LEVOTHROID) 100 MCG tablet Take 100 mcg by mouth daily before  breakfast.     . loratadine (CLARITIN) 10 MG tablet Take 10 mg by mouth daily as needed for allergies.    . metoprolol tartrate (LOPRESSOR) 50 MG tablet Take 50 mg by mouth 2 (two) times daily.    . Multiple Vitamins-Minerals (ICAPS AREDS 2) CAPS Take 1 capsule by mouth 2 (two) times daily.    . nitroGLYCERIN (NITROSTAT) 0.4 MG SL tablet Place 1 tablet (0.4 mg total) under the tongue every 5 (five) minutes as needed for chest pain. 25 tablet 11  . polyvinyl alcohol (LIQUIFILM TEARS) 1.4 % ophthalmic solution Place 1-2 drops into both eyes 3 (three) times daily as needed for dry eyes.    . rosuvastatin (CRESTOR) 10 MG tablet Take 1 tablet (10 mg total) by mouth daily. 90 tablet 3  . sodium chloride (OCEAN) 0.65 % SOLN nasal spray Place 1 spray into both nostrils as needed for congestion.     No current facility-administered medications on file prior to visit.     Allergies  Allergen Reactions  . Penicillins Anaphylaxis    Has patient had a PCN reaction causing immediate rash, facial/tongue/throat swelling, SOB or lightheadedness with hypotension: Yes Has patient had a PCN reaction causing severe rash involving mucus membranes or skin necrosis: No Has patient had a PCN reaction that required hospitalization: No Has patient had a PCN reaction occurring within the last 10 years: No If all of the above answers are "NO", then may proceed with Cephalosporin use.   . Tetanus Toxoid Anaphylaxis  . Demerol Other (See Comments)    Severe nausea  . Codeine Nausea Only    Severe nausea    Past Medical History:  Diagnosis Date  . Aortic stenosis, severe    a. undergoing work up for TAVR  . CKD (chronic kidney disease)   . Coronary artery disease    a. 12/20/2016: s/p DES to LAD   . Diverticulosis   . GERD (gastroesophageal reflux disease)   . Hemorrhoids   . Hyperlipidemia   . Hypertension   . Hypothyroidism   . Osteoarthritis   . Paroxysmal atrial fibrillation (HCC)    a. on [place] with  Xarelto but switched to Eliquis after stenting.   . Sick sinus syndrome ([place])    a. s/p PPM (MDT)    Past Surgical History:  Procedure Laterality Date  . ABDOMINAL HYSTERECTOMY  1980  . BASAL CELL CARCINOMA EXCISION    . CARDIAC CATHETERIZATION  ~ 11/2016  . CATARACT EXTRACTION W/ INTRAOCULAR LENS  IMPLANT, BILATERAL Bilateral   . CORONARY ANGIOPLASTY WITH STENT PLACEMENT  12/20/2016   "1 stent"  . FINGER SURGERY Left    "fell; developed skiers thumb; had to operate on it"  . INSERT / REPLACE / [place]   for syptomatic bradycardia and syncope -- in [facility]  .  INSERT / REPLACE / REMOVE PACEMAKER  2001   pulse generator replacement by Dr. Rollene Fare   . INSERT / REPLACE / REMOVE PACEMAKER  04/01/2008   PPM Medtronic -- model # [id number] serial # [id number] -- pulse generator replacement by Dr. Verlon Setting   . INSERT / REPLACE / REMOVE PACEMAKER  10/09/2016  . SQUAMOUS CELL CARCINOMA EXCISION    . TONSILLECTOMY      Social History   Tobacco Use  Smoking Status Former Smoker  . Packs/day: 1.50  . Years: 30.00  . Pack years: 45.00  . Types: Cigarettes  . Last attempt to quit: 03/05/1978  . Years since quitting: 38.8  Smokeless Tobacco Never Used    Social History   Substance and Sexual Activity  Alcohol Use No    Family History  Problem Relation Age of Onset  . Cancer Father        lung cancer  . Alzheimer's disease Mother     Review of Systems: As noted in history of present illness.  All other systems were reviewed and are negative.  Physical Exam: BP 120/70   Pulse 90   Ht 5\' 5"  (1.651 m)   Wt 127 lb (57.6 kg)   SpO2 98%   BMI 21.13 kg/m  GENERAL:  Well appearing WF in NAD HEENT:  PERRL, EOMI, sclera are clear. Oropharynx is clear. NECK:  No jugular venous distention, carotid upstroke brisk and symmetric, no bruits, no thyromegaly or adenopathy LUNGS:  Clear to auscultation bilaterally CHEST:  Unremarkable HEART:  RRR,  PMI not displaced or  sustained,S1  within normal limits, gr 3/6 harsh systolic murmur RUSB no S3, no S4: no clicks, no rubs. ABD:  Soft, nontender. BS +, no masses or bruits. No hepatomegaly, no splenomegaly EXT:  2 + pulses throughout, no edema, no cyanosis no clubbing. No hematoma at radial cath site. SKIN:  Warm and dry.  No rashes NEURO:  Alert and oriented x 3. Cranial nerves II through XII intact. PSYCH:  Cognitively intact    LABORATORY DATA:  Lab Results  Component Value Date   WBC 5.8 12/21/2016   HGB 9.5 (L) 12/21/2016   HCT 30.5 (L) 12/21/2016   PLT 242 12/21/2016   GLUCOSE 85 12/21/2016   NA 136 12/21/2016   K 4.0 12/21/2016   CL 100 (L) 12/21/2016   CREATININE 1.35 (H) 12/21/2016   BUN 16 12/21/2016   CO2 26 12/21/2016   TSH 0.90 06/13/2011   INR 1.2 12/17/2016   Labs dated 07/15/15: cholesterol 229, triglycerides 111, HDL 76, LDL 131.  Dated 07/19/16: cholesterol 247, triglycerides 71, HDL 88, LDL 145. BUN 28, creatinine 1.3. Other chemistries, CBC and TSH normal.  Echo: 08/18/15 Study Conclusions  - Left ventricle: The cavity size was normal. Wall thickness was  normal. Systolic function was normal. The estimated ejection  fraction was in the range of 55% to 60%. Wall motion was normal;  there were no regional wall motion abnormalities. Features are  consistent with a pseudonormal left ventricular filling pattern,  with concomitant abnormal relaxation and increased filling  pressure (grade 2 diastolic dysfunction). - Aortic valve: There was moderate stenosis. There was mild to  moderate regurgitation directed centrally in the LVOT. - Mitral valve: There was mild regurgitation directed centrally. - Tricuspid valve: There was mild-moderate regurgitation directed  centrally. - Pulmonary arteries: Systolic pressure was mildly increased. PA  peak pressure: 34 mm Hg (S).  Echo 06/20/16: Study Conclusions  - Left ventricle: The cavity size  was normal. There was mild    concentric hypertrophy. Systolic function was normal. The   estimated ejection fraction was in the range of 55% to 60%. Wall   motion was normal; there were no regional wall motion   abnormalities. - Aortic valve: Valve mobility was restricted. There was moderate   stenosis. There was moderate regurgitation. Peak velocity (S):   370 cm/s. Mean gradient (S): 30 mm Hg. Regurgitation pressure   half-time: 449 ms. - Mitral valve: Transvalvular velocity was within the normal range.   There was no evidence for stenosis. There was mild regurgitation. - Left atrium: The atrium was moderately dilated. - Right ventricle: The cavity size was normal. Wall thickness was   normal. Systolic function was normal. - Tricuspid valve: There was moderate regurgitation. - Pulmonary arteries: Systolic pressure was within the normal   range. PA peak pressure: 36 mm Hg (S).  Impressions:  - No significant changes since 08/2015.  Echo 12/26/16: Study Conclusions  - Left ventricle: The cavity size was normal. Wall thickness was   normal. Systolic function was normal. The estimated ejection   fraction was in the range of 60% to 65%. Wall motion was normal;   there were no regional wall motion abnormalities. Features are   consistent with a pseudonormal left ventricular filling pattern,   with concomitant abnormal relaxation and increased filling   pressure (grade 2 diastolic dysfunction). - Aortic valve: Moderately to severely calcified annulus.   Moderately thickened, moderately calcified leaflets. There was   moderate stenosis. There was mild regurgitation. Valve area   (VTI): 0.88 cm^2. Valve area (Vmax): 0.78 cm^2. Valve area   (Vmean): 0.74 cm^2. - Mitral valve: Calcified annulus. - Right atrium: The atrium was mildly dilated.   Labs from primary care dated 07/14/15: Normal CMET and CBC. Cholesterol 229, trig 111, HDL 76, LDL 131. TSH 1.98.   Myoview 10/03/16: Study Highlights     The left  ventricular ejection fraction is normal (55-65%).  Nuclear stress EF: 59%.  There was no ST segment deviation noted during stress.  The study is normal.   1. EF 59%, normal wall motion.  2. No evidence for ischemia or infarction by perfusion images.   Normal study.     Procedures   RIGHT/LEFT HEART CATH AND CORONARY ANGIOGRAPHY  Conclusion     Mid LAD lesion, 30 %stenosed.  Mid LAD to Dist LAD lesion, 85 %stenosed.  Prox RCA to Mid RCA lesion, 20 %stenosed.  Dist RCA lesion, 30 %stenosed.  Ost LM lesion, 10 %stenosed.  LV end diastolic pressure is normal.  LV end diastolic pressure is normal.   1. Single vessel obstructive CAD with 85% mid LAD stenosis 2. Severe aortic stenosis. Mean gradient of 40 mm Hg. AVA 0.61 cm squared with index .37. 3. Normal cardiac output. 4. Normal right heart and LV filling pressures  Plan: will discuss options for AVR. I think her best option based on age and co-morbidities would be TAVR with stenting of mid LAD. Will discuss with heart valve team.   Indications   Nonrheumatic aortic valve stenosis [I35.0 (ICD-10-CM)]  Procedural Details/Technique   Technical Details Indication: 83 yo WF with progressive dyspnea on exertion and aortic stenosis  Procedural Details: The right wrist was prepped, draped, and anesthetized with 1% lidocaine. Using the modified Seldinger technique a 6 Fr slender sheath was placed in the right radial artery and a 5 French sheath was placed in the right brachial vein. We were unable to  pass the Swan- Ganz catheter from the right brachial approach due to inability to pass catheter past the old pacing wires. The right groin was prepped, draped and anesthetized with 1% lidocaine. A 7 Fr sheath was placed percutaneously in the right femoral vein. A Swan-Ganz catheter was used for the right heart catheterization. Standard protocol was followed for recording of right heart pressures and sampling of oxygen  saturations. Fick cardiac output was calculated. Standard Judkins catheters were used for selective coronary angiography and left ventricular pressures. There were no immediate procedural complications. The patient was transferred to the post catheterization recovery area for further monitoring.  Contrast: 55 cc   Estimated blood loss <50 mL.  During this procedure the patient was administered the following to achieve and maintain moderate conscious sedation: Versed 1 mg, Fentanyl 25 mcg, while the patient's heart rate, blood pressure, and oxygen saturation were continuously monitored. The period of conscious sedation was 50 minutes, of which I was present face-to-face 100% of this time.    Complications   Complications documented before study signed (11/27/2016 11:04 AM EDT)    No complications were associated with this study.  Documented by [place], Kaedon Fanelli M, MD - 11/27/2016 11:04 AM EDT    Coronary Findings   Dominance: Right  Left Main  Ost LM lesion, 10% stenosed. The lesion is moderately calcified.  Left Anterior Descending  Mid LAD lesion, 30% stenosed. The lesion is moderately calcified.  Mid LAD to Dist LAD lesion, 85% stenosed.  Left Circumflex  Vessel is small.  Right Coronary Artery  Prox RCA to Mid RCA lesion, 20% stenosed.  Dist RCA lesion, 30% stenosed.  Right Heart   Right Heart Pressures LV EDP is normal.    Left Heart   Left Ventricle LV end diastolic pressure is normal.    Coronary Diagrams   Diagnostic Diagram       Implants     No implant documentation for this case.  MERGE Images   Show images for CARDIAC CATHETERIZATION   Link to Procedure Log   Procedure Log    Hemo Data    Most Recent Value  Fick Cardiac Output 3.62 L/min  Fick Cardiac Output Index 2.22 (L/min)/BSA  Aortic Mean Gradient 39.6 mmHg  Aortic Peak Gradient 38 mmHg  Aortic Valve Area 0.61  Aortic Value Area Index 0.37 cm2/BSA  RA A Wave 9 mmHg  RA V Wave 9 mmHg  RA Mean  6 mmHg  RV Systolic Pressure 34 mmHg  RV Diastolic Pressure 3 mmHg  RV EDP 10 mmHg  PA Systolic Pressure 32 mmHg  PA Diastolic Pressure 11 mmHg  PA Mean 20 mmHg  PW A Wave 18 mmHg  PW V Wave 23 mmHg  PW Mean 16 mmHg  AO Systolic Pressure 097 mmHg  AO Diastolic Pressure 71 mmHg  AO Mean 353 mmHg  LV Systolic Pressure 299 mmHg  LV Diastolic Pressure 5 mmHg  LV EDP 21 mmHg  Arterial Occlusion Pressure Extended Systolic Pressure 242 mmHg  Arterial Occlusion Pressure Extended Diastolic Pressure 67 mmHg  Arterial Occlusion Pressure Extended Mean Pressure 103 mmHg  Left Ventricular Apex Extended Systolic Pressure 683 mmHg  Left Ventricular Apex Extended Diastolic Pressure 3 mmHg  Left Ventricular Apex Extended EDP Pressure 22 mmHg  QP/QS 1  TPVR Index 9 HRUI  TSVR Index 48.14 HRUI  PVR SVR Ratio 0.04  TPVR/TSVR Ratio 0.19   Stent procedure: Conclusion     Mid LAD to Dist LAD lesion, 85 %stenosed.  A STENT PROMUS PREM MR 2.75X16 drug eluting stent was successfully placed.  Post intervention, there is a 0% residual stenosis.   1. Successful stenting of the mid LAD with DES      Assessment / Plan: 1. Atrial fibrillation with sick sinus syndrome. Prior history of  persistent AFib.  Now on amiodarone since November- converted to NSR and no recurrence.   Continue long-term anticoagulation. On  Eliquis 2.5 mg bid.  Dose adjusted for age and renal function. S/p pacemaker replacement for failed RA lead.   2. Aortic stenosis- moderate by Echo in April and October but by cardiac cath data is more consistent with  Severe AS. She is symptomatic. Going through TAVR evaluation now with plans to proceed with TAVR November 27.   3. CAD with significant single vessel obstructive disease in the mid LAD. S/p PCI of the LAD with DES.  On ASA, Plavix, and Eliquis. Will discontinue ASA in 2 weeks.    4. HTN- controlled  5. Hyperlipidemia. On  Crestor 10 mg daily. Needs repeat lipids in 2  months.

## 2017-01-02 NOTE — Therapy (Signed)
[place], [place], [address] Phone: [phone]   Fax:  [phone]  Physical Therapy Evaluation  Patient Details  Name: [name] [name] MRN: [id number] Date of Birth: [birth year] Referring Provider: Angelena Form, PA  Encounter Date: 01/02/2017      PT End of Session - 01/02/17 1324    Visit Number 1   PT Start Time 3646   PT Stop Time 1310   PT Time Calculation (min) 40 min      Past Medical History:  Diagnosis Date  . Aortic stenosis, severe    a. undergoing work up for TAVR  . CKD (chronic kidney disease)   . Coronary artery disease    a. 12/20/2016: s/p DES to LAD   . Diverticulosis   . GERD (gastroesophageal reflux disease)   . Hemorrhoids   . Hyperlipidemia   . Hypertension   . Hypothyroidism   . Osteoarthritis   . Paroxysmal atrial fibrillation (HCC)    a. on [place] with Xarelto but switched to Eliquis after stenting.   . Sick sinus syndrome ([place])    a. s/p PPM (MDT)    Past Surgical History:  Procedure Laterality Date  . ABDOMINAL HYSTERECTOMY  1980  . BASAL CELL CARCINOMA EXCISION    . CARDIAC CATHETERIZATION  ~ 11/2016  . CATARACT EXTRACTION W/ INTRAOCULAR LENS  IMPLANT, BILATERAL Bilateral   . CORONARY ANGIOPLASTY WITH STENT PLACEMENT  12/20/2016   "1 stent"  . CORONARY STENT INTERVENTION N/A 12/20/2016   Procedure: CORONARY STENT INTERVENTION;  Surgeon: Martinique, Peter M, MD;  Location: [facility];  Service: Cardiovascular;  Laterality: N/A;  . FINGER SURGERY Left    "fell; developed skiers thumb; had to operate on it"  . INSERT / REPLACE / [place]   for syptomatic bradycardia and syncope -- in [facility]  . INSERT / REPLACE / REMOVE PACEMAKER  2001   pulse generator replacement by Dr. Rollene Fare   . INSERT / REPLACE / REMOVE PACEMAKER  04/01/2008   PPM Medtronic -- model # [id number] serial # [id number] -- pulse generator replacement by Dr. Verlon Setting   .  INSERT / REPLACE / REMOVE PACEMAKER  10/09/2016  . LEAD REVISION/REPAIR N/A 10/09/2016   Procedure: Lead Revision/Repair;  Surgeon: Thompson Grayer, MD;  Location: [facility];  Service: Cardiovascular;  Laterality: N/A;  . RIGHT/LEFT HEART CATH AND CORONARY ANGIOGRAPHY N/A 11/27/2016   Procedure: RIGHT/LEFT HEART CATH AND CORONARY ANGIOGRAPHY;  Surgeon: Martinique, Peter M, MD;  Location: [facility];  Service: Cardiovascular;  Laterality: N/A;  . SQUAMOUS CELL CARCINOMA EXCISION    . TONSILLECTOMY      There were no vitals filed for this visit.       Subjective Assessment - 01/02/17 1238    Subjective Pt reports history of aortic stenosis and a gradual progression of shortness of breath with activity such as walking over the past 3 months. Pt also describes some type of chest pressure and leg weakness when she gets short of breath which both resolve for rest. Pt reports some improvement with the stent and it is easier to climb stairs and walk longer but she still gets short of breath.    Patient Stated Goals to fix heart   Currently in Pain? No/denies            [facility] PT Assessment - 01/02/17 0001      Assessment   Medical Diagnosis severe aortic stenosis  Referring Provider Angelena Form, PA   Onset Date/Surgical Date --  approximately 3 months     Precautions   Precautions None     Restrictions   Weight Bearing Restrictions No     Balance Screen   Has the patient fallen in the past 6 months No   Has the patient had a decrease in activity level because of a fear of falling?  No   Is the patient reluctant to leave their home because of a fear of falling?  No     Home Environment   Living Environment Private residence   [place] to enter   Entrance Stairs-Rails Right;Left;Cannot reach both   Home Layout Two level;Able to live on main level with bedroom/bathroom     Prior Function   Level of Independence Independent  with community mobility without device     Posture/Postural Control   Posture/Postural Control Postural limitations   Postural Limitations Rounded Shoulders;Forward head     ROM / Strength   AROM / PROM / Strength AROM;Strength     AROM   Overall AROM Comments grossly WNL     Strength   Overall Strength Comments grossly 4-5/5 throughout   Strength Assessment Site Hand   Right/Left hand Right;Left   Right Hand Grip (lbs) 55  R hand dominant   Left Hand Grip (lbs) 48     Ambulation/Gait   Gait Comments Pt ambulates with slow speed but no significant deviations noted. Gait distance is limited by 40% for age/gender.           [facility] Pre-Surgical Assessment - 01/02/17 0001    5 Meter Walk Test- trial 1 9 sec   5 Meter Walk Test- trial 2 8 sec.    5 Meter Walk Test- trial 3 8 sec.   5 meter walk test average 8.33 sec   4 Stage Balance Test tolerated for:  10 sec.   4 Stage Balance Test Position 4   Sit To Stand Test- trial 1 29 sec.  difficult due to knee OA   ADL/IADL Independent with: Bathing;Dressing;Meal prep;Finances   ADL/IADL Needs Assistance with: [place] Leaver work   ADL/IADL Fraility Index Vulnerable   6 Minute Walk- Baseline yes   BP (mmHg) 135/81   HR (bpm) 78   02 Sat (%RA) 99 %   Modified Borg Scale for Dyspnea 0- Nothing at all   Perceived Rate of Exertion (Borg) 6-   6 Minute Walk Post Test yes   BP (mmHg) 152/83   HR (bpm) 94   02 Sat (%RA) 90 %   Modified Borg Scale for Dyspnea 3- Moderate shortness of breath or breathing difficulty   Aerobic Endurance Distance Walked 770          Objective measurements completed on examination: See above findings.                               Plan - 01/02/17 1324    Clinical Impression Statement see below   PT Frequency One time visit     Clinical Impression Statement: Pt is an 83 yo female presenting to OP PT for evaluation prior to possible TAVR surgery due to severe aortic stenosis. Pt  reports onset of shortness of breath primarily with walking over the past 3 months. Pt presents with good ROM and strength, good balance and is not at high fall risk 4 stage balance  test, slow walking speed and fair to poor aerobic endurance per 6 minute walk test. Pt ambulated a total of 770 feet in 6 minute walk. O2 dropped to 90% around minute 5 and recovered within 45 seconds rest at the end. Based on the Short Physical Performance Battery, patient has a frailty rating of 7/12 with </= 5/12 considered frail.   Patient demonstrated the following deficits and impairments:     Visit Diagnosis: Other abnormalities of gait and mobility      G-Codes - 05-Jan-2017 1324    Functional Assessment Tool Used (Outpatient Only) 6 minute walk 770'   Functional Limitation Mobility: Walking and moving around   Mobility: Walking and Moving Around Current Status [phone]) At least 40 percent but less than 60 percent impaired, limited or restricted   Mobility: Walking and Moving Around Goal Status [phone]) At least 40 percent but less than 60 percent impaired, limited or restricted   Mobility: Walking and Moving Around Discharge Status [phone]) At least 40 percent but less than 60 percent impaired, limited or restricted       Problem List Patient Active Problem List   Diagnosis Date Noted  . CKD (chronic kidney disease)   . CAD in native artery 12/20/2016  . Pacemaker-Medtronic 01/01/2012  . Long term current use of anticoagulant therapy 06/18/2011  . Severe aortic stenosis 09/01/2010  . BRADYCARDIA-TACHYCARDIA SYNDROME 03/31/2010  . HLD (hyperlipidemia) 03/30/2010  . Essential hypertension 03/30/2010  . ATRIAL FIBRILLATION 03/30/2010  . OSTEOARTHRITIS 03/30/2010    Tarren Velardi, PT 2017-01-05, 1:25 PM  [facility] [address] [place], [place], [address] Phone: [phone]   Fax:  [phone]  Name: [name] [name] MRN: [id number] Date of  Birth: 12/01/1929

## 2017-01-02 NOTE — Patient Instructions (Signed)
Stop taking Eliquis 1 week prior to surgery (January 22, 2017)  Continue taking all other medications without change through the day before surgery.  Have nothing to eat or drink after midnight the night before surgery.  On the morning of surgery take only Synthroid and Norvasc with a sip of water.

## 2017-01-03 ENCOUNTER — Encounter: Payer: Medicare Other | Admitting: Thoracic Surgery (Cardiothoracic Vascular Surgery)

## 2017-01-03 ENCOUNTER — Other Ambulatory Visit: Payer: Self-pay

## 2017-01-03 DIAGNOSIS — I350 Nonrheumatic aortic (valve) stenosis: Secondary | ICD-10-CM

## 2017-01-07 ENCOUNTER — Encounter: Payer: Self-pay | Admitting: *Deleted

## 2017-01-07 ENCOUNTER — Ambulatory Visit: Payer: Medicare Other | Admitting: Cardiology

## 2017-01-07 ENCOUNTER — Other Ambulatory Visit: Payer: Self-pay | Admitting: *Deleted

## 2017-01-07 VITALS — BP 120/70 | HR 90 | Ht 65.0 in | Wt 127.0 lb

## 2017-01-07 DIAGNOSIS — I480 Paroxysmal atrial fibrillation: Secondary | ICD-10-CM | POA: Diagnosis not present

## 2017-01-07 DIAGNOSIS — I350 Nonrheumatic aortic (valve) stenosis: Secondary | ICD-10-CM | POA: Diagnosis not present

## 2017-01-07 DIAGNOSIS — I25118 Atherosclerotic heart disease of native coronary artery with other forms of angina pectoris: Secondary | ICD-10-CM | POA: Diagnosis not present

## 2017-01-10 ENCOUNTER — Other Ambulatory Visit: Payer: Self-pay

## 2017-01-10 ENCOUNTER — Institutional Professional Consult (permissible substitution): Payer: Medicare Other | Admitting: Surgery

## 2017-01-10 ENCOUNTER — Encounter: Payer: Self-pay | Admitting: Surgery

## 2017-01-10 VITALS — BP 116/72 | HR 78 | Ht 65.0 in | Wt 125.0 lb

## 2017-01-10 DIAGNOSIS — I350 Nonrheumatic aortic (valve) stenosis: Secondary | ICD-10-CM | POA: Diagnosis not present

## 2017-01-10 NOTE — Progress Notes (Signed)
Patient ID: [name] [name], female   DOB: 06-Sep-1929, 83 y.o.   MRN: [id number]   [facility] CONSULTATION REPORT  Referring Provider is Martinique, Peter M, MD PCP is [name], MD  Chief Complaint  Patient presents with  . 2nd TAVR evaluation    HPI:  The patient is an 83 year old woman with a history of hypertension, hyperlipidemia, paroxysmal atrial fibrillation, sick sinus syndrome status post permanent pacemaker, stage IIIb chronic kidney disease, aortic stenosis, and coronary artery disease status post recent PCI with a drug-eluting stent to the LAD on 12/20/2016.  Previous echocardiograms had demonstrated moderate aortic stenosis with normal left ventricular systolic function.  An echo in April 2018 showed a mean gradient of 30 mmHg.  The dimensionless index was 0.19.  Over the past 4-6 months the patient has had progressive exertional shortness of breath, fatigue, and chest discomfort.  She was seen in the atrial fibrillation clinic and noted to be in atrial fibrillation.  She was started on amiodarone and converted to sinus rhythm.  She was also found to have failure of her right atrial lead and underwent explant and placement of a new dual-chamber pacemaker on 10/09/2016.  She continued to have symptoms and underwent cardiac catheterization on 11/27/2016.  This showed severe aortic stenosis with a mean transvalvular gradient of 40 mmHg and an aortic valve area of 0.61 cm.  Cardiac catheterization also showed an 85% stenosis of the mid LAD with otherwise nonobstructive disease.  This was treated with a drug-eluting stent on 12/20/2016.  She was switched to Eliquis in addition to aspirin and Plavix.  She said that she has felt better since the PCI with an improvement in her symptoms.  She is now able to go up and down stairs with less shortness of breath.  She still has some shortness of breath with exertion but overall  feels better.  She denies any resting shortness of breath, orthopnea, or PND.  She has occasional mild dizzy spells.  She is here today with her sister-in-law who lives next door.  She is widowed and lives alone in [place].   Past Medical History:  Diagnosis Date  . Aortic stenosis, severe    a. undergoing work up for TAVR  . CKD (chronic kidney disease)   . Coronary artery disease    a. 12/20/2016: s/p DES to LAD   . Diverticulosis   . GERD (gastroesophageal reflux disease)   . Hemorrhoids   . Hyperlipidemia   . Hypertension   . Hypothyroidism   . Osteoarthritis   . Paroxysmal atrial fibrillation (HCC)    a. on [place] with Xarelto but switched to Eliquis after stenting.   . Sick sinus syndrome ([facility])    a. s/p PPM (MDT)    Past Surgical History:  Procedure Laterality Date  . ABDOMINAL HYSTERECTOMY  1980  . BASAL CELL CARCINOMA EXCISION    . CARDIAC CATHETERIZATION  ~ 11/2016  . CATARACT EXTRACTION W/ INTRAOCULAR LENS  IMPLANT, BILATERAL Bilateral   . CORONARY ANGIOPLASTY WITH STENT PLACEMENT  12/20/2016   "1 stent"  . FINGER SURGERY Left    "fell; developed skiers thumb; had to operate on it"  . INSERT / REPLACE / [place]   for syptomatic bradycardia and syncope -- in [facility]  . INSERT / REPLACE / REMOVE PACEMAKER  2001   pulse generator replacement by Dr. Rollene Fare   . INSERT / REPLACE / REMOVE  PACEMAKER  04/01/2008   PPM Medtronic -- model # [id number] serial # [id number] -- pulse generator replacement by Dr. Verlon Setting   . INSERT / REPLACE / REMOVE PACEMAKER  10/09/2016  . SQUAMOUS CELL CARCINOMA EXCISION    . TONSILLECTOMY      Family History  Problem Relation Age of Onset  . Cancer Father        lung cancer  . Alzheimer's disease Mother     Social History   Socioeconomic History  . Marital status: Widowed    Spouse name: Not on file  . Number of children: 0  . Years of education: Not on file  . Highest education level: Not on file  Social  Needs  . Financial resource strain: Not on file  . Food insecurity - worry: Not on file  . Food insecurity - inability: Not on file  . Transportation needs - medical: Not on file  . Transportation needs - non-medical: Not on file  Occupational History    Employer: RETIRED  Tobacco Use  . Smoking status: Former Smoker    Packs/day: 1.50    Years: 30.00    Pack years: 45.00    Types: Cigarettes    Last attempt to quit: 03/05/1978    Years since quitting: 38.8  . Smokeless tobacco: Never Used  Substance and Sexual Activity  . Alcohol use: No  . Drug use: No  . Sexual activity: Not on file  Other Topics Concern  . Not on file  Social History Narrative  . Not on file    Current Outpatient Medications  Medication Sig Dispense Refill  . acetaminophen (TYLENOL) 500 MG tablet Take 1,000 mg by mouth every 4 (four) hours as needed for moderate pain or headache.     Marland Kitchen amiodarone (PACERONE) 200 MG tablet Take 1/2 tablet (100 mg) by mouth daily (Patient taking differently: Take 100 mg by mouth daily. ) 90 tablet 3  . amLODipine (NORVASC) 2.5 MG tablet Take 1 tablet (2.5 mg total) by mouth daily. 30 tablet 6  . apixaban (ELIQUIS) 2.5 MG TABS tablet Take 1 tablet (2.5 mg total) by mouth 2 (two) times daily. 60 tablet 11  . aspirin EC 81 MG tablet Take 81 mg by mouth daily.    . calcium carbonate (OS-CAL) 600 MG TABS Take 600 mg by mouth every evening.     . Cholecalciferol (VITAMIN D) 1000 UNITS capsule Take 1,000 Units by mouth daily.      . clopidogrel (PLAVIX) 75 MG tablet Take 1 tablet (75 mg total) by mouth daily. (Patient taking differently: Take 75 mg by mouth every evening. ) 90 tablet 3  . dicyclomine (BENTYL) 10 MG capsule Take 10 mg by mouth 2 (two) times daily as needed for spasms.   1  . furosemide (LASIX) 20 MG tablet Take 1 tablet (20 mg total) by mouth daily as needed (weight gain). (Patient taking differently: Take 20 mg by mouth daily as needed (for fluid retention/weight  gain.). ) 30 tablet 6  . levothyroxine (SYNTHROID, LEVOTHROID) 100 MCG tablet Take 100 mcg by mouth daily before breakfast.     . loratadine (CLARITIN) 10 MG tablet Take 10 mg by mouth daily as needed for allergies.    . metoprolol tartrate (LOPRESSOR) 50 MG tablet Take 50 mg by mouth 2 (two) times daily.    . Multiple Vitamins-Minerals (ICAPS AREDS 2) CAPS Take 1 capsule by mouth 2 (two) times daily.    . nitroGLYCERIN (NITROSTAT) 0.4 MG SL  tablet Place 1 tablet (0.4 mg total) under the tongue every 5 (five) minutes as needed for chest pain. 25 tablet 11  . polyvinyl alcohol (LIQUIFILM TEARS) 1.4 % ophthalmic solution Place 1-2 drops into both eyes 3 (three) times daily as needed for dry eyes.    . rosuvastatin (CRESTOR) 10 MG tablet Take 1 tablet (10 mg total) by mouth daily. 90 tablet 3  . sodium chloride (OCEAN) 0.65 % SOLN nasal spray Place 1 spray into both nostrils as needed for congestion.     No current facility-administered medications for this visit.     Allergies  Allergen Reactions  . Penicillins Anaphylaxis    Has patient had a PCN reaction causing immediate rash, facial/tongue/throat swelling, SOB or lightheadedness with hypotension: Yes Has patient had a PCN reaction causing severe rash involving mucus membranes or skin necrosis: No Has patient had a PCN reaction that required hospitalization: No Has patient had a PCN reaction occurring within the last 10 years: No If all of the above answers are "NO", then may proceed with Cephalosporin use.   . Tetanus Toxoid Anaphylaxis  . Demerol Other (See Comments)    Severe nausea  . Codeine Nausea Only    Severe nausea      Review of Systems:   General:  normal appetite, reduced energy, no weight gain, no weight loss, no fever  Cardiac:  no chest pain with exertion, no chest pain at rest, has SOB with moderate exertion, no resting SOB, no PND, no orthopnea, some palpitations, has arrhythmia, has atrial fibrillation, no LE  edema, occasional dizzy spells, no syncope  Respiratory:  Exertional shortness of breath, no home oxygen, no productive cough, no dry cough, no bronchitis, no wheezing, no hemoptysis, no asthma, no pain with inspiration or cough, no sleep apnea, no CPAP at night  GI:   no difficulty swallowing, no reflux, no frequent heartburn, no hiatal hernia, no abdominal pain, no constipation, no diarrhea, no hematochezia, no hematemesis, no melena  GU:   no dysuria,  has frequency, no urinary tract infection, no hematuria, no kidney stones, no kidney disease  Vascular:  no pain suggestive of claudication, no pain in feet, no leg cramps, no varicose veins, no DVT, no non-healing foot ulcer  Neuro:   no stroke, no TIA's, no seizures, no headaches, no temporary blindness one eye,  no slurred speech, no peripheral neuropathy, no chronic pain, no instability of gait, no memory/cognitive dysfunction  Musculoskeletal: has arthritis, has joint swelling, no myalgias, no difficulty walking, normal mobility   Skin:   no rash, no itching, no skin infections, no pressure sores or ulcerations  Psych:   no anxiety, no depression, no nervousness, has unusual recent stress  Eyes:   has blurry vision, no floaters, no recent vision changes,  wears glasses   ENT:   no hearing loss, no loose or painful teeth, partial dentures, last saw dentist 02/2016  Hematologic:  has easy bruising, no abnormal bleeding, no clotting disorder, no frequent epistaxis  Endocrine:  no diabetes, does not check CBG's at home     Physical Exam:   BP 116/72   Pulse 78   Ht 5\' 5"  (1.651 m)   Wt 125 lb (56.7 kg)   SpO2 96%   BMI 20.80 kg/m   General:  Elderly, thin but  well-appearing  HEENT:  Unremarkable, NCAT, PERLA, EOMI, oropharynx clear  Neck:   no JVD, no bruits, no adenopathy or thyromegaly  Chest:   clear to auscultation,  symmetrical breath sounds, no wheezes, no rhonchi   CV:   RRR, grade III/VI crescendo/decrescendo murmur heard best  at RSB,  no diastolic murmur  Abdomen:  soft, non-tender, no masses or organomegaly  Extremities:  warm, well-perfused, pulses palpable in feet, no LE edema  Rectal/GU  Deferred  Neuro:   Grossly non-focal and symmetrical throughout  Skin:   Clean and dry, no rashes, no breakdown   Diagnostic Tests:   [facility]                        [address], [place] [address]                            [phone]  ------------------------------------------------------------------- Transthoracic Echocardiography  Patient:    [name] [name], [name] [name] MR #:       [id number] Study Date: 12/26/2016 Gender:     F Age:        20 Height:     167.6 cm Weight:     56.7 kg BSA:        1.62 m^2 Pt. Status: Room:   PERFORMING   [facility], Outpatient  SONOGRAPHER  [facility], RDCS  ATTENDING    Nell Range  Riley Kill, Katie  Sharl Ma, Katie  cc:  ------------------------------------------------------------------- LV EF: 60% -   65%  ------------------------------------------------------------------- Indications:      Aortic Stenosis (I35.0).  ------------------------------------------------------------------- History:   PMH:   Chest pain.  Dyspnea.  Atrial fibrillation. Coronary artery disease.  Aortic valve disease.  Risk factors: Aortic Stenosis, Work up for TAVR, Sick Sinus Syndrome, Chronic Kidney Disease. Former tobacco use. Hypertension. Dyslipidemia.  ------------------------------------------------------------------- Study Conclusions  - Left ventricle: The cavity size was normal. Wall thickness was   normal. Systolic function was normal. The estimated ejection   fraction was in the range of 60% to 65%. Wall motion was normal;   there were no regional wall motion abnormalities. Features are   consistent with a pseudonormal left ventricular filling pattern,   with concomitant abnormal relaxation and  increased filling   pressure (grade 2 diastolic dysfunction). - Aortic valve: Moderately to severely calcified annulus.   Moderately thickened, moderately calcified leaflets. There was   moderate stenosis. There was mild regurgitation. Valve area   (VTI): 0.88 cm^2. Valve area (Vmax): 0.78 cm^2. Valve area   (Vmean): 0.74 cm^2. - Mitral valve: Calcified annulus. - Right atrium: The atrium was mildly dilated.  ------------------------------------------------------------------- Labs, prior tests, procedures, and surgery: Permanent pacemaker system implantation.  Permanent pacemaker system implantation.  ------------------------------------------------------------------- Study data:  Comparison was made to the study of 06/20/2016.  Study status:  Routine.  Procedure:  Transthoracic echocardiography. Image quality was adequate.          Transthoracic echocardiography.  M-mode, complete 2D, 3D, spectral Doppler, and color Doppler.  Birthdate:  Patient birthdate: 23-Nov-1929.  Age: Patient is 83 yr old.  Sex:  Gender: female.    BMI: 20.2 kg/m^2. Blood pressure:     143/82  Patient status:  Outpatient.  Study date:  Study date: 12/26/2016. Study time: 02:11 PM.  Location: [facility]  -------------------------------------------------------------------  ------------------------------------------------------------------- Left ventricle:  Global longitudinal strain = -17.1%.  The cavity size was normal. Wall  thickness was normal. Systolic function was normal. The estimated ejection fraction was in the range of 60% to 65%. Wall motion was normal; there were no regional wall motion abnormalities. Features are consistent with a pseudonormal left ventricular filling pattern, with concomitant abnormal relaxation and increased filling pressure (grade 2 diastolic dysfunction).  ------------------------------------------------------------------- Aortic valve:   Moderately to  severely calcified annulus. Moderately thickened, moderately calcified leaflets.  Doppler: There was moderate stenosis.   There was mild regurgitation.    VTI ratio of LVOT to aortic valve: 0.28. Valve area (VTI): 0.88 cm^2. Indexed valve area (VTI): 0.54 cm^2/m^2. Peak velocity ratio of LVOT to aortic valve: 0.25. Valve area (Vmax): 0.78 cm^2. Indexed valve area (Vmax): 0.48 cm^2/m^2. Mean velocity ratio of LVOT to aortic valve: 0.24. Valve area (Vmean): 0.74 cm^2. Indexed valve area (Vmean): 0.46 cm^2/m^2.    Mean gradient (S): 28 mm Hg. Peak gradient (S): 53 mm Hg.  ------------------------------------------------------------------- Aorta:  Aortic root: The aortic root was normal in size. Ascending aorta: The ascending aorta was normal in size.  ------------------------------------------------------------------- Mitral valve:   Calcified annulus.  Doppler:  There was no significant regurgitation.    Peak gradient (D): 3 mm Hg.  ------------------------------------------------------------------- Left atrium:  The atrium was normal in size.  ------------------------------------------------------------------- Right ventricle:  The cavity size was normal. Pacer wire or catheter noted in right ventricle. Systolic function was normal.   ------------------------------------------------------------------- Pulmonic valve:   Poorly visualized.  Doppler:  There was no significant regurgitation.  ------------------------------------------------------------------- Tricuspid valve:   Mildly thickened leaflets.  Doppler:  There was mild regurgitation.  ------------------------------------------------------------------- Pulmonary artery:   Systolic pressure was within the normal range.   ------------------------------------------------------------------- Right atrium:  The atrium was mildly dilated.  ------------------------------------------------------------------- Pericardium:   There was no pericardial effusion.  ------------------------------------------------------------------- Systemic veins: Inferior vena cava: The vessel was normal in size. The respirophasic diameter changes were in the normal range (>= 50%), consistent with normal central venous pressure.  ------------------------------------------------------------------- Measurements   Left ventricle                           Value          Reference  LV ID, ED, PLAX chordal           (L)    33    mm       43 - 52  LV ID, ES, PLAX chordal           (L)    22.1  mm       23 - 38  LV fx shortening, PLAX chordal           33    %        >=29  LV PW thickness, ED                      10.7  mm       ----------  IVS/LV PW ratio, ED                      0.86           <=1.3  Stroke volume, 2D                        75    ml       ----------  Stroke volume/bsa, 2D  46    ml/m^2   ----------  LV e&', lateral                           5.87  cm/s     ----------  LV E/e&', lateral                         15.78          ----------  LV e&', medial                            5.33  cm/s     ----------  LV E/e&', medial                          17.37          ----------  LV e&', average                           5.6   cm/s     ----------  LV E/e&', average                         16.54          ----------  Longitudinal strain, TDI                 17    %        ----------    Ventricular septum                       Value          Reference  IVS thickness, ED                        9.17  mm       ----------    LVOT                                     Value          Reference  LVOT ID, S                               20    mm       ----------  LVOT area                                3.14  cm^2     ----------  LVOT peak velocity, S                    90.2  cm/s     ----------  LVOT mean velocity, S                    56.7  cm/s     ----------  LVOT VTI, S                              24    cm        ----------  Aortic valve                             Value          Reference  Aortic valve peak velocity, S            364   cm/s     ----------  Aortic valve mean velocity, S            241   cm/s     ----------  Aortic valve VTI, S                      85.8  cm       ----------  Aortic mean gradient, S                  28    mm Hg    ----------  Aortic peak gradient, S                  53    mm Hg    ----------  VTI ratio, LVOT/AV                       0.28           ----------  Aortic valve area, VTI                   0.88  cm^2     ----------  Aortic valve area/bsa, VTI               0.54  cm^2/m^2 ----------  Velocity ratio, peak, LVOT/AV            0.25           ----------  Aortic valve area, peak velocity         0.78  cm^2     ----------  Aortic valve area/bsa, peak              0.48  cm^2/m^2 ----------  velocity  Velocity ratio, mean, LVOT/AV            0.24           ----------  Aortic valve area, mean velocity         0.74  cm^2     ----------  Aortic valve area/bsa, mean              0.46  cm^2/m^2 ----------  velocity  Aortic regurg pressure half-time         482   ms       ----------    Aorta                                    Value          Reference  Aortic root ID, ED                       29    mm       ----------  Ascending aorta ID, A-P, S               30    mm       ----------    Left atrium  Value          Reference  LA ID, A-P, ES                           38    mm       ----------  LA ID/bsa, A-P                    (H)    2.34  cm/m^2   <=2.2  LA volume, S                             50.8  ml       ----------  LA volume/bsa, S                         31.3  ml/m^2   ----------  LA volume, ES, 1-p A4C                   53.1  ml       ----------  LA volume/bsa, ES, 1-p A4C               32.8  ml/m^2   ----------  LA volume, ES, 1-p A2C                   43.8  ml       ----------  LA volume/bsa, ES, 1-p A2C               27     ml/m^2   ----------    Mitral valve                             Value          Reference  Mitral E-wave peak velocity              92.6  cm/s     ----------  Mitral A-wave peak velocity              87.8  cm/s     ----------  Mitral deceleration time                 218   ms       150 - 230  Mitral peak gradient, D                  3     mm Hg    ----------  Mitral E/A ratio, peak                   1.1            ----------  Mitral regurg VTI, PISA                  233   cm       ----------    Tricuspid valve                          Value          Reference  Tricuspid regurg peak velocity           262   cm/s     ----------  Tricuspid peak RV-RA gradient            27  mm Hg    ----------    Right atrium                             Value          Reference  RA ID, S-I, ES, A4C               (H)    53.7  mm       34 - 49  RA area, ES, A4C                         19    cm^2     8.3 - 19.5  RA volume, ES, A/L                       55    ml       ----------  RA volume/bsa, ES, A/L                   33.9  ml/m^2   ----------    Right ventricle                          Value          Reference  TAPSE                                    27.4  mm       ----------  RV s&', lateral, S                        10.6  cm/s     ----------  Legend: (L)  and  (H)  mark values outside specified reference range.  ------------------------------------------------------------------- Prepared and Electronically Authenticated by  Mertie Moores, M.D. 2018-10-24T16:48:44   Physicians   Panel Physicians Referring Physician Case Authorizing Physician  Martinique, Peter M, MD (Primary)    Procedures   RIGHT/LEFT HEART CATH AND CORONARY ANGIOGRAPHY  Conclusion     Mid LAD lesion, 30 %stenosed.  Mid LAD to Dist LAD lesion, 85 %stenosed.  Prox RCA to Mid RCA lesion, 20 %stenosed.  Dist RCA lesion, 30 %stenosed.  Ost LM lesion, 10 %stenosed.  LV end diastolic pressure is normal.  LV end  diastolic pressure is normal.   1. Single vessel obstructive CAD with 85% mid LAD stenosis 2. Severe aortic stenosis. Mean gradient of 40 mm Hg. AVA 0.61 cm squared with index .37. 3. Normal cardiac output. 4. Normal right heart and LV filling pressures  Plan: will discuss options for AVR. I think her best option based on age and co-morbidities would be TAVR with stenting of mid LAD. Will discuss with heart valve team.   Indications   Nonrheumatic aortic valve stenosis [I35.0 (ICD-10-CM)]  Procedural Details/Technique   Technical Details Indication: 83 yo WF with progressive dyspnea on exertion and aortic stenosis  Procedural Details: The right wrist was prepped, draped, and anesthetized with 1% lidocaine. Using the modified Seldinger technique a 6 Fr slender sheath was placed in the right radial artery and a 5 French sheath was placed in the right brachial vein. We were unable to pass the Swan- Ganz catheter from the right brachial approach due to inability to pass catheter past the old pacing  wires. The right groin was prepped, draped and anesthetized with 1% lidocaine. A 7 Fr sheath was placed percutaneously in the right femoral vein. A Swan-Ganz catheter was used for the right heart catheterization. Standard protocol was followed for recording of right heart pressures and sampling of oxygen saturations. Fick cardiac output was calculated. Standard Judkins catheters were used for selective coronary angiography and left ventricular pressures. There were no immediate procedural complications. The patient was transferred to the post catheterization recovery area for further monitoring.  Contrast: 55 cc   Estimated blood loss <50 mL.  During this procedure the patient was administered the following to achieve and maintain moderate conscious sedation: Versed 1 mg, Fentanyl 25 mcg, while the patient's heart rate, blood pressure, and oxygen saturation were continuously monitored. The period of  conscious sedation was 50 minutes, of which I was present face-to-face 100% of this time.  Complications   Complications documented before study signed (11/27/2016 11:04 AM EDT)    No complications were associated with this study.  Documented by Martinique, Peter M, MD - 11/27/2016 11:04 AM EDT    Coronary Findings   Diagnostic  Dominance: Right  Left Main  Ost LM lesion 10% stenosed  The lesion is moderately calcified.  Left Anterior Descending  Mid LAD lesion 30% stenosed  The lesion is moderately calcified.  Mid LAD to Dist LAD lesion 85% stenosed  Mid LAD to Dist LAD lesion.  Left Circumflex  Vessel is small.  Right Coronary Artery  Prox RCA to Mid RCA lesion 20% stenosed  Prox RCA to Mid RCA lesion.  Dist RCA lesion 30% stenosed  Dist RCA lesion.  Intervention   No interventions have been documented.  Right Heart   Right Heart Pressures LV EDP is normal.  Left Heart   Left Ventricle LV end diastolic pressure is normal.  Coronary Diagrams   Diagnostic Diagram       Implants     No implant documentation for this case.  MERGE Images   Show images for CARDIAC CATHETERIZATION   Link to Procedure Log   Procedure Log    Hemo Data    Most Recent Value  Fick Cardiac Output 3.62 L/min  Fick Cardiac Output Index 2.22 (L/min)/BSA  Aortic Mean Gradient 39.6 mmHg  Aortic Peak Gradient 38 mmHg  Aortic Valve Area 0.61  Aortic Value Area Index 0.37 cm2/BSA  RA A Wave 9 mmHg  RA V Wave 9 mmHg  RA Mean 6 mmHg  RV Systolic Pressure 34 mmHg  RV Diastolic Pressure 3 mmHg  RV EDP 10 mmHg  PA Systolic Pressure 32 mmHg  PA Diastolic Pressure 11 mmHg  PA Mean 20 mmHg  PW A Wave 18 mmHg  PW V Wave 23 mmHg  PW Mean 16 mmHg  AO Systolic Pressure 161 mmHg  AO Diastolic Pressure 71 mmHg  AO Mean 096 mmHg  LV Systolic Pressure 045 mmHg  LV Diastolic Pressure 5 mmHg  LV EDP 21 mmHg  Arterial Occlusion Pressure Extended Systolic Pressure 409 mmHg  Arterial Occlusion  Pressure Extended Diastolic Pressure 67 mmHg  Arterial Occlusion Pressure Extended Mean Pressure 103 mmHg  Left Ventricular Apex Extended Systolic Pressure 811 mmHg  Left Ventricular Apex Extended Diastolic Pressure 3 mmHg  Left Ventricular Apex Extended EDP Pressure 22 mmHg  QP/QS 1  TPVR Index 9 HRUI  TSVR Index 48.14 HRUI  PVR SVR Ratio 0.04  TPVR/TSVR Ratio 0.19    ADDENDUM REPORT: 12/31/2016 17:49  CLINICAL DATA:  Aortic stenosis  EXAM: Cardiac TAVR CT  TECHNIQUE: The patient was scanned on a Siemens 192 scanner. A 120 kV retrospective scan was triggered in the Ascending thoracic aorta at 140 HU's. Gantry rotation speed was 250 msecs and collimation was .6 mm. No beta blockade or nitro were given. The 3D data set was reconstructed in 5% intervals of the R-R cycle. Systolic and diastolic phases were analyzed on a dedicated work station using MPR, MIP and VRT modes. The patient received 80 cc of contrast. Due to CRF a bicarbonate infusion was given before study  FINDINGS: Aortic Valve: Tri leaflet and heavily calcified with restricted systolic motion  Aorta: Marked calcific atherosclerosis of the arch, left subclavian and descending thoracic aorta  Sinotubular Junction:  24 mm  Ascending Thoracic Aorta:  32 mm  Aortic Arch:  26 mm  Descending Thoracic Aorta:  26 mm  Sinus of Valsalva Measurements:  Non-coronary:  29 mm  Right -coronary:  28 mm  Left -coronary:  29 mm  Coronary Artery Height above Annulus:  Left Main:  11.5 mm above annulus  Right Coronary:  10.1 mm above annulus  Virtual Basal Annulus Measurements:  Maximum/Minimum Diameter:  24 mm x 20.8 mm  Perimeter:  71 mm  Area:  392 mm 2  Coronary Arteries: Sufficient height above annulus for deployment patent stent in mid LAD Although RCA origin is a bit shallow  Optimum Fluoroscopic Angle for Delivery: LAO 33 degrees Caudal 4 degrees  IMPRESSION: 1) Calcified  tri leaflet aortic valve with annular area of 392 mm 2 suitable for a 23 mm Sapiens 3 valve and perimeter of 71 mm suitable for a 26 mm Evolut Pro valve  2) Coronary arteries sufficient height above annulus for deployment although RCA is a bit shallow  3) Marked calcific atherosclerosis of the aortic arch, left subclavian origin and descending thoracic aorta  4) Optimum angiographic angle for deployment LAO 33 degrees Caudal 4 degrees  5) No LAA thrombus  6) Pacing wires seen in RA/RV  7) Patient stent in mid LAD  Jenkins Rouge   Electronically Signed   By: Jenkins Rouge M.D.   On: 12/31/2016 17:49       CLINICAL DATA:  Severe aortic stenosis.  Pre-TAVR evaluation.  EXAM: CT ANGIOGRAPHY CHEST, ABDOMEN AND PELVIS  TECHNIQUE: Multidetector CT imaging through the chest, abdomen and pelvis was performed using the standard protocol during bolus administration of intravenous contrast. Multiplanar reconstructed images and MIPs were obtained and reviewed to evaluate the vascular anatomy.  CONTRAST:  60 cc Isovue 370 IV.  COMPARISON:  06/19/2016 CT abdomen/pelvis. 10/10/2016 chest radiograph.  FINDINGS: CTA CHEST FINDINGS  Cardiovascular: Mild cardiomegaly. No significant pericardial fluid/thickening. Left main, left anterior descending and right coronary artery calcifications. Abandoned 2 lead right subclavian pacer leads are noted with lead tips in the right atrium and right ventricle. Two lead left subclavian pacemaker is noted with lead tips in the right atrium and right ventricular apex. Atherosclerotic nonaneurysmal thoracic aorta. No evidence of aortic dissection, acute intramural hematoma, pseudoaneurysm or penetrating atherosclerotic ulcer. Main pulmonary artery diameter 3.0 cm, top-normal. No central pulmonary emboli. Severe thickening and calcification of the aortic valve.  Mediastinum/Nodes: No discrete thyroid nodules.  Unremarkable esophagus. No pathologically enlarged axillary, mediastinal or hilar lymph nodes.  Lungs/Pleura: No pneumothorax. No pleural effusion. There is mild scattered cylindrical and varicoid bronchiectasis throughout both lungs, most prominent in the medial segment right middle lobe and lingula. There is associated diffuse bronchial wall thickening and scattered mild  tree-in-bud opacities throughout both lungs. Subpleural bandlike opacities in the medial segment right middle lobe and inferior segment lingula with associated mild volume loss and distortion are compatible with mild postinfectious scarring. No acute consolidative airspace disease or lung masses.  Musculoskeletal: No aggressive appearing focal osseous lesions. Moderate lower thoracic spondylosis.  CTA ABDOMEN AND PELVIS FINDINGS  Hepatobiliary: Normal liver with no liver mass. Normal gallbladder with no radiopaque cholelithiasis. No biliary ductal dilatation.  Pancreas: Normal, with no mass or duct dilation.  Spleen: Normal size. No mass.  Adrenals/Urinary Tract: Normal adrenals. Simple 0.8 cm renal cyst in the interpolar left kidney. Otherwise normal kidneys, with no overt hydronephrosis. Normal bladder.  Stomach/Bowel: Grossly normal stomach. Normal caliber small bowel with no small bowel wall thickening. Normal appendix. Moderate sigmoid diverticulosis, no large bowel wall thickening or pericolonic fat stranding.  Vascular/Lymphatic: Atherosclerotic nonaneurysmal abdominal aorta. Patent splenic and renal veins. No pathologically enlarged lymph nodes in the abdomen or pelvis.  Reproductive: Status post hysterectomy, with no abnormal findings at the vaginal cuff. No adnexal mass.  Other: No pneumoperitoneum, ascites or focal fluid collection.  Musculoskeletal: No aggressive appearing focal osseous lesions. Moderate lumbar spondylosis, most prominent at the superior  L1 endplate.  VASCULAR MEASUREMENTS PERTINENT TO TAVR:  AORTA:  Minimal Aortic Diameter -  10.9 mm  Severity of Aortic Calcification -  moderate to severe  RIGHT PELVIS:  Right Common Iliac Artery -  Minimal Diameter - 8.9 x 6.0 mm  Tortuosity - mild  Calcification - moderate  Right External Iliac Artery -  Minimal Diameter - 7.5 x 6.3 mm  Tortuosity - mild-to-moderate  Calcification - none  Right Common Femoral Artery -  Minimal Diameter - 7.7 x 5.7 mm  Tortuosity - mild  Calcification - mild-to-moderate  LEFT PELVIS:  Left Common Iliac Artery -  Minimal Diameter - 7.7 x 6.2 mm  Tortuosity - mild  Calcification - moderate  Left External Iliac Artery -  Minimal Diameter - 7.1 x 6.4 mm  Tortuosity - mild-to-moderate  Calcification - none  Left Common Femoral Artery -  Minimal Diameter - 8.4 x 6.1 mm  Tortuosity - mild  Calcification - mild-to-moderate  Review of the MIP images confirms the above findings.  IMPRESSION: 1. Vascular findings and measurements burden and to potential TAVR procedure, as detailed above. 2. Severe thickening and calcification of the aortic valve, compatible with the reported clinical history of severe aortic stenosis. 3. Spectrum of findings most compatible with chronic infectious bronchiolitis due to atypical mycobacterial infection (MAI), with mild cylindrical and varicoid bronchiectasis, mild tree-in-bud opacities and mild postinfectious scarring throughout both lungs, most prominent in the mid lungs. 4. Moderate sigmoid diverticulosis. 5.  Aortic Atherosclerosis (ICD10-I70.0).   Electronically Signed   By: Ilona Sorrel M.D.   On: 01/01/2017 09:49   Procedure: AV Replacement + CAB  Risk of Mortality: 9.114%  Morbidity or Mortality: 36.184%  Long Length of Stay: 22.403%  Short Length of Stay: 8.31%  Permanent Stroke: 3.032%  Prolonged Ventilation: 25.651%  DSW  Infection: 0.247%  Renal Failure: 12.107%  Reoperation: 12.802%     Impression:  This 83 year old woman has stage D severe symptomatic aortic stenosis with high-grade single-vessel coronary artery disease.  She presented with New York Heart Association class II symptoms of exertional shortness of breath, fatigue, and chest discomfort consistent with chronic diastolic heart failure and stable angina.  She has improved symptomatically after PCI of her LAD but continues to have some shortness of breath with  exertion.  I have personally reviewed her most recent echocardiogram, cardiac cath, and CTA studies.  Echocardiogram shows a trileaflet aortic valve with severe thickening and calcification of all 3 leaflets with restricted leaflet mobility.  The mean aortic valve gradient was 28 mmHg with a dimensionless index of 0.25.  Her valve looks severely stenotic.  Cardiac catheterization confirmed severe aortic stenosis with a mean transvalvular gradient of 40 mmHg and a calculated aortic valve area of 0.61 cm.  Left ventricular systolic function is normal.  She underwent successful PCI with a drug-eluting stent to the LAD and otherwise has mild nonobstructive disease.  I agree that aortic valve replacement is indicated in this patient who is still functionally independent and quite active.  Her risk for open surgical aortic valve replacement and coronary bypass grafting would be at least moderately elevated due to her advanced age and other comorbid factors.  I agree that TAVR is the best treatment for this patient.  Her gated cardiac CT shows anatomy favorable for TAVR using a Sapien 3 valve.  Her abdominal and pelvic CT shows anatomy suitable for transfemoral insertion.  The patient and her sister-in-law were counseled at length regarding treatment alternatives for management of severe symptomatic aortic stenosis. The risks and benefits of surgical intervention has been discussed in detail. Long-term  prognosis with medical therapy was discussed. Alternative approaches such as conventional surgical aortic valve replacement, transcatheter aortic valve replacement, and palliative medical therapy were compared and contrasted at length. This discussion was placed in the context of the patient's own specific clinical presentation and past medical history. All of their questions have been addressed.  Following the decision to proceed with transcatheter aortic valve replacement, a discussion was held regarding what types of management strategies would be attempted intraoperatively in the event of life-threatening complications, including whether or not the patient would be considered a candidate for the use of cardiopulmonary bypass and/or conversion to open sternotomy for attempted surgical intervention.   The patient has been advised of a variety of complications that might develop including but not limited to risks of death, stroke, paravalvular leak, aortic dissection or other major vascular complications, aortic annulus rupture, device embolization, cardiac rupture or perforation, mitral regurgitation, acute myocardial infarction, arrhythmia, heart block or bradycardia requiring permanent pacemaker placement, congestive heart failure, respiratory failure, renal failure, pneumonia, infection, other late complications related to structural valve deterioration or migration, or other complications that might ultimately cause a temporary or permanent loss of functional independence or other long term morbidity. The patient provides full informed consent for the procedure as described and all questions were answered.     Plan:  Transfemoral TAVR on 01/29/2017.  The patient will discontinue Eliquis 7 days preoperatively and will continue her aspirin and Plavix.   I spent 30 minutes performing this consultation and > 50% of this time was spent face to face counseling and coordinating the care of this patient's  severe aortic stenosis.    Gaye Pollack, MD 01/10/2017

## 2017-01-11 ENCOUNTER — Other Ambulatory Visit: Payer: Self-pay | Admitting: Nurse Practitioner

## 2017-01-14 ENCOUNTER — Ambulatory Visit (INDEPENDENT_AMBULATORY_CARE_PROVIDER_SITE_OTHER): Payer: Medicare Other | Admitting: Internal Medicine

## 2017-01-14 ENCOUNTER — Other Ambulatory Visit (HOSPITAL_COMMUNITY): Payer: Self-pay | Admitting: *Deleted

## 2017-01-14 ENCOUNTER — Encounter: Payer: Self-pay | Admitting: Internal Medicine

## 2017-01-14 ENCOUNTER — Other Ambulatory Visit: Payer: Self-pay | Admitting: Physician Assistant

## 2017-01-14 VITALS — BP 134/80 | HR 80 | Ht 65.0 in | Wt 127.4 lb

## 2017-01-14 DIAGNOSIS — I480 Paroxysmal atrial fibrillation: Secondary | ICD-10-CM | POA: Diagnosis not present

## 2017-01-14 DIAGNOSIS — I350 Nonrheumatic aortic (valve) stenosis: Secondary | ICD-10-CM

## 2017-01-14 DIAGNOSIS — I495 Sick sinus syndrome: Secondary | ICD-10-CM

## 2017-01-14 LAB — CUP PACEART INCLINIC DEVICE CHECK
Battery Impedance: 100 Ohm
Battery Remaining Longevity: 143 mo
Brady Statistic AP VP Percent: 0 %
Brady Statistic AP VS Percent: 98 %
Brady Statistic AS VS Percent: 2 %
Date Time Interrogation Session: 20181112165137
Implantable Lead Implant Date: 20180807
Implantable Lead Location: 753860
Implantable Lead Model: 5076
Implantable Pulse Generator Implant Date: 20180807
Lead Channel Impedance Value: 554 Ohm
Lead Channel Pacing Threshold Amplitude: 0.625 V
Lead Channel Pacing Threshold Amplitude: 0.75 V
Lead Channel Pacing Threshold Amplitude: 0.75 V
Lead Channel Pacing Threshold Pulse Width: 0.4 ms
Lead Channel Pacing Threshold Pulse Width: 0.4 ms
Lead Channel Setting Pacing Amplitude: 2.5 V
Lead Channel Setting Pacing Pulse Width: 0.4 ms
Lead Channel Setting Sensing Sensitivity: 5.6 mV
MDC IDC LEAD IMPLANT DT: 20180807
MDC IDC LEAD LOCATION: 753859
MDC IDC MSMT BATTERY VOLTAGE: 2.79 V
MDC IDC MSMT LEADCHNL RA IMPEDANCE VALUE: 346 Ohm
MDC IDC MSMT LEADCHNL RA PACING THRESHOLD PULSEWIDTH: 0.4 ms
MDC IDC MSMT LEADCHNL RA SENSING INTR AMPL: 2 mV
MDC IDC MSMT LEADCHNL RV PACING THRESHOLD AMPLITUDE: 0.625 V
MDC IDC MSMT LEADCHNL RV PACING THRESHOLD PULSEWIDTH: 0.4 ms
MDC IDC MSMT LEADCHNL RV SENSING INTR AMPL: 22.4 mV
MDC IDC SET LEADCHNL RA PACING AMPLITUDE: 1.5 V
MDC IDC STAT BRADY AS VP PERCENT: 0 %

## 2017-01-14 MED ORDER — METOPROLOL TARTRATE 50 MG PO TABS: 50.0000 mg | ORAL_TABLET | Freq: Two times a day (BID) | ORAL | 6 refills | Status: DC

## 2017-01-14 NOTE — Telephone Encounter (Signed)
Medication Detail    Disp Refills Start End   metoprolol tartrate (LOPRESSOR) 50 MG tablet 60 tablet 6 01/14/2017    Sig - Route: Take 1 tablet (50 mg total) 2 (two) times daily by mouth. - Oral   Sent to pharmacy as: metoprolol tartrate (LOPRESSOR) 50 MG tablet   E-Prescribing Status: Receipt confirmed by pharmacy (01/14/2017 12:47 PM EST)   Pharmacy   [id number] - [place], [address] AT [place]

## 2017-01-14 NOTE — Patient Instructions (Addendum)
Medication Instructions:  Your physician recommends that you continue on your current medications as directed. Please refer to the Current Medication list given to you today.  -- If you need a refill on your cardiac medications before your next appointment, please call your pharmacy. --  Labwork: None ordered  Testing/Procedures: None ordered  Follow-Up: Your physician wants you to follow-up in: 1 year with Dr. [name].  You will receive a reminder letter in the mail two months in advance. If you don't receive a letter, please call our office to schedule the follow-up appointment.  Remote monitoring is used to monitor your Pacemaker  from home. This monitoring reduces the number of office visits required to check your device to one time per year. It allows [place] to keep an eye on the functioning of your device to ensure it is working properly. You are scheduled for a device check from home on 04/15/2017. You may send your transmission at any time that day. If you have a wireless device, the transmission will be sent automatically. After your physician reviews your transmission, you will receive a postcard with your next transmission date.    Thank you for choosing [facility]!!   Frederik Schmidt, RN [phone]  Any Other Special Instructions Will Be Listed Below (If Applicable).

## 2017-01-14 NOTE — Progress Notes (Signed)
PCP: [name], MD Primary Cardiologist:  Dr Martinique Primary EP:  Dr Aurea Graff [name] is a 83 y.o. female who presents today for routine electrophysiology followup.  Since her recent PPM implant, the patient reports doing reasonably well.  She subsequently underwent PCI for CAD and is planned to have TAVR for severe AS later this month.  SOB is stable. Today, she denies symptoms of palpitations, chest pain, lower extremity edema, dizziness, presyncope, or syncope.  The patient is otherwise without complaint today.   Past Medical History:  Diagnosis Date  . Aortic stenosis, severe    a. undergoing work up for TAVR  . CKD (chronic kidney disease)   . Coronary artery disease    a. 12/20/2016: s/p DES to LAD   . Diverticulosis   . GERD (gastroesophageal reflux disease)   . Hemorrhoids   . Hyperlipidemia   . Hypertension   . Hypothyroidism   . Osteoarthritis   . Paroxysmal atrial fibrillation (HCC)    a. on [place] with Xarelto but switched to Eliquis after stenting.   . Sick sinus syndrome ([place])    a. s/p PPM (MDT)   Past Surgical History:  Procedure Laterality Date  . ABDOMINAL HYSTERECTOMY  1980  . BASAL CELL CARCINOMA EXCISION    . CARDIAC CATHETERIZATION  ~ 11/2016  . CATARACT EXTRACTION W/ INTRAOCULAR LENS  IMPLANT, BILATERAL Bilateral   . CORONARY ANGIOPLASTY WITH STENT PLACEMENT  12/20/2016   "1 stent"  . FINGER SURGERY Left    "fell; developed skiers thumb; had to operate on it"  . INSERT / REPLACE / [place]   for syptomatic bradycardia and syncope -- in [facility]  . PACEMAKER GENERATOR CHANGE  2001   pulse generator replacement by Dr. Rollene Fare   . PACEMAKER GENERATOR CHANGE  04/01/2008   PPM Medtronic -- model # [id number] serial # [id number] -- pulse generator replacement by Dr. Verlon Setting   . SQUAMOUS CELL CARCINOMA EXCISION    . TONSILLECTOMY      ROS- all systems are reviewed and negative except as per HPI above  Current Outpatient  Medications  Medication Sig Dispense Refill  . acetaminophen (TYLENOL) 500 MG tablet Take 1,000 mg by mouth every 4 (four) hours as needed for moderate pain or headache.     Marland Kitchen amiodarone (PACERONE) 200 MG tablet Take 1/2 tablet (100 mg) by mouth daily (Patient taking differently: Take 100 mg by mouth daily. ) 90 tablet 3  . amLODipine (NORVASC) 2.5 MG tablet Take 1 tablet (2.5 mg total) by mouth daily. 30 tablet 6  . apixaban (ELIQUIS) 2.5 MG TABS tablet Take 1 tablet (2.5 mg total) by mouth 2 (two) times daily. 60 tablet 11  . aspirin EC 81 MG tablet Take 81 mg by mouth daily.    . calcium carbonate (OS-CAL) 600 MG TABS Take 600 mg by mouth every evening.     . Cholecalciferol (VITAMIN D) 1000 UNITS capsule Take 1,000 Units by mouth daily.      . clopidogrel (PLAVIX) 75 MG tablet Take 1 tablet (75 mg total) by mouth daily. (Patient taking differently: Take 75 mg by mouth every evening. ) 90 tablet 3  . dicyclomine (BENTYL) 10 MG capsule Take 10 mg by mouth 2 (two) times daily as needed for spasms.   1  . esomeprazole (NEXIUM) 40 MG capsule Take 40 mg daily at 12 noon by mouth.    . furosemide (LASIX) 20 MG tablet Take 1 tablet (  20 mg total) by mouth daily as needed (weight gain). (Patient taking differently: Take 20 mg by mouth daily as needed (for fluid retention/weight gain.). ) 30 tablet 6  . levothyroxine (SYNTHROID, LEVOTHROID) 100 MCG tablet Take 100 mcg by mouth daily before breakfast.     . loratadine (CLARITIN) 10 MG tablet Take 10 mg by mouth daily as needed for allergies.    . metoprolol tartrate (LOPRESSOR) 50 MG tablet Take 1 tablet (50 mg total) 2 (two) times daily by mouth. 60 tablet 6  . nitroGLYCERIN (NITROSTAT) 0.4 MG SL tablet Place 1 tablet (0.4 mg total) under the tongue every 5 (five) minutes as needed for chest pain. 25 tablet 11  . polyvinyl alcohol (LIQUIFILM TEARS) 1.4 % ophthalmic solution Place 1-2 drops into both eyes 3 (three) times daily as needed for dry eyes.    .  rosuvastatin (CRESTOR) 10 MG tablet Take 1 tablet (10 mg total) by mouth daily. 90 tablet 3  . sodium chloride (OCEAN) 0.65 % SOLN nasal spray Place 1 spray into both nostrils as needed for congestion.     No current facility-administered medications for this visit.     Physical Exam: Vitals:   01/14/17 1354  BP: 134/80  Pulse: 80  SpO2: 96%  Weight: 127 lb 6.4 oz (57.8 kg)  Height: 5\' 5"  (1.651 m)    GEN- The patient is elderly and frail appearing, alert and oriented x 3 today.   Head- normocephalic, atraumatic Eyes-  Sclera clear, conjunctiva pink Ears- hearing intact Oropharynx- clear Lungs- Clear to ausculation bilaterally, normal work of breathing Chest- pacemaker pocket is well healed, old pacemaker site (r sided) also well healed Heart- Regular rate and rhythm, no murmurs, rubs or gallops, PMI not laterally displaced GI- soft, NT, ND, + BS Extremities- no clubbing, cyanosis, or edema  Pacemaker interrogation- reviewed in detail today,  See PACEART report  ekg tracing ordered today is personally reviewed and shows atrial pacing 80 bpm, PR 286 msec,   Assessment and Plan:  1. Symptomatic sinus bradycardia  Normal pacemaker function See Pace Art report No changes today  2. afib Continue amiodarone 100mg  daily On eliquis as well as dual antiplatelet therapy I worry about long term antiplatelet therapy.  Hopefully this can be reduced to single antiplatelet therapy and [place] soon. I will defer to Dr Martinique.  3. Severe AS Plans for TAVR noted  4. HTN Stable No change required today  [facility] Follow-up with me in a year  Thompson Grayer MD, [facility] 01/14/2017 2:18 PM

## 2017-01-22 ENCOUNTER — Ambulatory Visit: Payer: Medicare Other | Admitting: Cardiology

## 2017-01-23 ENCOUNTER — Other Ambulatory Visit (HOSPITAL_COMMUNITY): Payer: Medicare Other

## 2017-01-28 ENCOUNTER — Telehealth: Payer: Self-pay | Admitting: Cardiovascular Disease

## 2017-01-28 ENCOUNTER — Other Ambulatory Visit (HOSPITAL_COMMUNITY): Payer: Medicare Other

## 2017-01-28 ENCOUNTER — Other Ambulatory Visit: Payer: Self-pay

## 2017-01-28 ENCOUNTER — Encounter (HOSPITAL_COMMUNITY)
Admission: RE | Admit: 2017-01-28 | Discharge: 2017-01-28 | Disposition: A | Discharge status: Home / Self Care | Payer: Medicare Other | Source: Ambulatory Visit | Attending: Cardiovascular Disease | Admitting: Cardiovascular Disease

## 2017-01-28 ENCOUNTER — Encounter (HOSPITAL_COMMUNITY): Payer: Self-pay

## 2017-01-28 ENCOUNTER — Ambulatory Visit (HOSPITAL_COMMUNITY)
Admission: RE | Admit: 2017-01-28 | Discharge: 2017-01-28 | Disposition: A | Discharge status: Home / Self Care | Payer: Medicare Other | Source: Ambulatory Visit | Attending: Cardiovascular Disease | Admitting: Cardiovascular Disease

## 2017-01-28 DIAGNOSIS — I700 Atherosclerosis of aorta: Secondary | ICD-10-CM

## 2017-01-28 DIAGNOSIS — Z01810 Encounter for preprocedural cardiovascular examination: Secondary | ICD-10-CM | POA: Insufficient documentation

## 2017-01-28 DIAGNOSIS — I350 Nonrheumatic aortic (valve) stenosis: Secondary | ICD-10-CM

## 2017-01-28 DIAGNOSIS — Z950 Presence of cardiac pacemaker: Secondary | ICD-10-CM

## 2017-01-28 DIAGNOSIS — Z01812 Encounter for preprocedural laboratory examination: Secondary | ICD-10-CM

## 2017-01-28 HISTORY — DX: Presence of cardiac pacemaker: Z95.0

## 2017-01-28 HISTORY — DX: Adverse effect of unspecified anesthetic, initial encounter: T41.45XA

## 2017-01-28 HISTORY — DX: Other complications of anesthesia, initial encounter: T88.59XA

## 2017-01-28 LAB — URINALYSIS, ROUTINE W REFLEX MICROSCOPIC
Bacteria, UA: NONE SEEN
Bilirubin Urine: NEGATIVE
GLUCOSE, UA: NEGATIVE mg/dL
HGB URINE DIPSTICK: NEGATIVE
Ketones, ur: NEGATIVE mg/dL
NITRITE: NEGATIVE
Protein, ur: NEGATIVE mg/dL
SPECIFIC GRAVITY, URINE: 1.017 (ref 1.005–1.030)
pH: 6 (ref 5.0–8.0)

## 2017-01-28 LAB — CBC
HCT: 32.6 % — ABNORMAL LOW (ref 36.0–46.0)
Hemoglobin: 9.6 g/dL — ABNORMAL LOW (ref 12.0–15.0)
MCH: 25.7 pg — ABNORMAL LOW (ref 26.0–34.0)
MCHC: 29.4 g/dL — ABNORMAL LOW (ref 30.0–36.0)
MCV: 87.2 fL (ref 78.0–100.0)
PLATELETS: 252 10*3/uL (ref 150–400)
RBC: 3.74 MIL/uL — ABNORMAL LOW (ref 3.87–5.11)
RDW: 17.3 % — AB (ref 11.5–15.5)
WBC: 5.8 10*3/uL (ref 4.0–10.5)

## 2017-01-28 LAB — BLOOD GAS, ARTERIAL
ACID-BASE EXCESS: 1.9 mmol/L (ref 0.0–2.0)
BICARBONATE: 25.4 mmol/L (ref 20.0–28.0)
DRAWN BY: 449841
FIO2: 21
O2 SAT: 91.9 %
PH ART: 7.46 — AB (ref 7.350–7.450)
Patient temperature: 98.6
pCO2 arterial: 36.2 mmHg (ref 32.0–48.0)
pO2, Arterial: 63.7 mmHg — ABNORMAL LOW (ref 83.0–108.0)

## 2017-01-28 LAB — APTT: APTT: 29 s (ref 24–36)

## 2017-01-28 LAB — COMPREHENSIVE METABOLIC PANEL
ALT: 15 U/L (ref 14–54)
AST: 26 U/L (ref 15–41)
Albumin: 4 g/dL (ref 3.5–5.0)
Alkaline Phosphatase: 65 U/L (ref 38–126)
Anion gap: 8 (ref 5–15)
BUN: 27 mg/dL — AB (ref 6–20)
CHLORIDE: 102 mmol/L (ref 101–111)
CO2: 28 mmol/L (ref 22–32)
Calcium: 9.7 mg/dL (ref 8.9–10.3)
Creatinine, Ser: 1.37 mg/dL — ABNORMAL HIGH (ref 0.44–1.00)
GFR, EST AFRICAN AMERICAN: 40 mL/min — AB (ref >60)
GFR, EST NON AFRICAN AMERICAN: 34 mL/min — AB (ref >60)
Glucose, Bld: 87 mg/dL (ref 65–99)
POTASSIUM: 4.2 mmol/L (ref 3.5–5.1)
SODIUM: 138 mmol/L (ref 135–145)
Total Bilirubin: 0.9 mg/dL (ref 0.3–1.2)
Total Protein: 6.5 g/dL (ref 6.5–8.1)

## 2017-01-28 LAB — SURGICAL PCR SCREEN
MRSA, PCR: NEGATIVE
Staphylococcus aureus: POSITIVE — AB

## 2017-01-28 LAB — BRAIN NATRIURETIC PEPTIDE: B Natriuretic Peptide: 522 pg/mL — ABNORMAL HIGH (ref 0.0–100.0)

## 2017-01-28 LAB — ABO/RH: ABO/RH(D): A POS

## 2017-01-28 LAB — PROTIME-INR
INR: 0.99
PROTHROMBIN TIME: 13 s (ref 11.4–15.2)

## 2017-01-28 MED ORDER — DEXMEDETOMIDINE HCL IN NACL 400 MCG/100ML IV SOLN
0.1000 ug/kg/h | INTRAVENOUS | Status: AC
  Administered 2017-01-29: .5 ug/kg/h via INTRAVENOUS
  Filled 2017-01-28: qty 100

## 2017-01-28 MED ORDER — NOREPINEPHRINE BITARTRATE 1 MG/ML IV SOLN
0.0000 ug/min | INTRAVENOUS | Status: AC
  Administered 2017-01-29: 2 ug/min via INTRAVENOUS
  Filled 2017-01-28: qty 4

## 2017-01-28 MED ORDER — HEPARIN SODIUM (PORCINE) 1000 UNIT/ML IJ SOLN
INTRAMUSCULAR | Status: DC
  Filled 2017-01-28: qty 30

## 2017-01-28 MED ORDER — LEVOFLOXACIN IN D5W 500 MG/100ML IV SOLN
500.0000 mg | INTRAVENOUS | Status: AC
  Administered 2017-01-29: 500 mg via INTRAVENOUS
  Filled 2017-01-28: qty 100

## 2017-01-28 MED ORDER — EPINEPHRINE PF 1 MG/ML IJ SOLN
0.0000 ug/min | INTRAMUSCULAR | Status: DC
  Filled 2017-01-28: qty 4

## 2017-01-28 MED ORDER — MAGNESIUM SULFATE 50 % IJ SOLN
40.0000 meq | INTRAMUSCULAR | Status: DC
  Filled 2017-01-28: qty 9.85

## 2017-01-28 MED ORDER — NITROGLYCERIN IN D5W 200-5 MCG/ML-% IV SOLN
2.0000 ug/min | INTRAVENOUS | Status: DC
Start: 2017-01-29 — End: 2017-01-29
  Filled 2017-01-28: qty 250

## 2017-01-28 MED ORDER — DOPAMINE-DEXTROSE 3.2-5 MG/ML-% IV SOLN
0.0000 ug/kg/min | INTRAVENOUS | Status: DC
  Filled 2017-01-28: qty 250

## 2017-01-28 MED ORDER — SODIUM CHLORIDE 0.9 % IV SOLN
INTRAVENOUS | Status: DC
  Filled 2017-01-28: qty 1

## 2017-01-28 MED ORDER — VANCOMYCIN HCL 10 G IV SOLR
1250.0000 mg | INTRAVENOUS | Status: AC
  Administered 2017-01-29: 1250 mg via INTRAVENOUS
  Filled 2017-01-28: qty 1250

## 2017-01-28 MED ORDER — POTASSIUM CHLORIDE 2 MEQ/ML IV SOLN
80.0000 meq | INTRAVENOUS | Status: DC
  Filled 2017-01-28: qty 40

## 2017-01-28 MED ORDER — SODIUM CHLORIDE 0.9 % IV SOLN
30.0000 ug/min | INTRAVENOUS | Status: DC
  Filled 2017-01-28 (×2): qty 2

## 2017-01-28 NOTE — H&P (Signed)
[address]       [place] [address]             [phone]          CARDIOTHORACIC SURGERY HISTORY AND PHYSICAL EXAM  Referring Provider is Martinique, Peter M, MD PCP is [name], MD      Chief Complaint  Patient presents with  . New Patient (Initial Visit)    TAVR evaluation    HPI:  Patient is an 83 year old female with aortic stenosis, coronary artery disease, atrial fibrillation on long-term anticoagulation, tachybradycardia syndrome status post permanent pacemaker placement, hypertension, and hyperlipidemia who has been referred for surgical consultation to discuss treatment options for management of severe symptomatic aortic stenosis. The patient has a long history of atrial fibrillation and tachybradycardia syndrome and she underwent permanent pacemaker placement in the remote past and this had a variety of complications requiring generator change and ultimately pacemaker replacement. She has history of paroxysmal atrial fibrillation and has been followed for several years in the atrial fibrillation clinic. Previous echocardiograms have demonstrated the presence of moderate aortic stenosis with normal left ventricular systolic function. Echocardiogram performed 06/20/2016 revealed peak velocity across the aortic valve measured 3.7 m/s corresponding to mean transvalvular gradient estimated 30 mmHg. The DVI was notably only 0.19. Left ventricular systolic function remained normal with ejection fraction estimated 55-60%. Over the past 4-6 months the patient has developed symptoms of exertional shortness of breath and chest discomfort. She was seen in the atrial fibrillation clinic and noted to be in persistent atrial fibrillation. She was started on amiodarone and converted to sinus rhythm.  Several months ago she was seen in the atrial fibrillation clinic and noted to have failure of her right atrial lead for her pacemaker. She ultimately underwent explant  and placement of a new dual-chamber pacemaker on 10/09/2016. Despite this the patient continued to complain of symptoms of exertional shortness of breath and chest discomfort. She was seen in follow-up by Dr. Martinique in early September and subsequently underwent diagnostic cardiac catheterization on 11/27/2016. She was found to have severe aortic stenosis with severe single-vessel coronary artery disease. Mean transvalvular gradient across the aortic valve was measured 40 mmHg corresponding to aortic valve area calculated 0.61 cm. There was normal cardiac output and normal right heart and left ventricular filling pressures. Coronary angiography revealed 85% stenosis of the mid left anterior descending coronary artery with otherwise mild nonobstructive coronary artery disease.  The patient was referred to the multidisciplinary heart valve team and subsequently underwent PCI and stenting of the left anterior descending coronary artery using a drug eluding stent on 12/20/2016. She was subsequently referred for surgical consultation.  The patient is widowed and lives alone in [place]. Her sister-in-law lives next door, is very supportive and accompanies her for her consultation today. The patient is a retired Radio producer and remain physically active all of her adult life. She reports no significant physical limitations until over the past 4-6 months during which time she has developed exertional shortness of breath and chest discomfort. This limits her considerably. She used to go walking on a daily basis but can no longer do so. She now gets short of breath with moderate activity. She states that she may have experienced some improvement over last 2 weeks since she underwent stenting, but she still gets some exertional shortness of breath and chest discomfort, particularly when she goes up a flight of stairs. She denies any history of resting shortness  of breath, PND, orthopnea, or lower extremity edema. She  has not had palpitations or syncope. She reports occasional mild dizzy spells.    Past Medical History:  Diagnosis Date  . Aortic stenosis, severe    a. undergoing work up for TAVR  . Cancer [facility])    "Skin Cancer"  . CKD (chronic kidney disease)   . Complication of anesthesia   . Coronary artery disease    a. 12/20/2016: s/p DES to LAD   . Diverticulosis   . GERD (gastroesophageal reflux disease)   . Hemorrhoids   . Hyperlipidemia   . Hypertension   . Hypothyroidism   . Osteoarthritis   . Paroxysmal atrial fibrillation (HCC)    a. on [place] with Xarelto but switched to Eliquis after stenting.   Marland Kitchen PONV (postoperative nausea and vomiting)    "severe" per patient  . Presence of permanent cardiac pacemaker   . Sick sinus syndrome ([place])    a. s/p PPM (MDT)    Past Surgical History:  Procedure Laterality Date  . ABDOMINAL HYSTERECTOMY  1980  . BASAL CELL CARCINOMA EXCISION    . CARDIAC CATHETERIZATION  ~ 11/2016  . CATARACT EXTRACTION W/ INTRAOCULAR LENS  IMPLANT, BILATERAL Bilateral   . CORONARY ANGIOPLASTY WITH STENT PLACEMENT  12/20/2016   "1 stent"  . CORONARY STENT INTERVENTION N/A 12/20/2016   Procedure: CORONARY STENT INTERVENTION;  Surgeon: Martinique, Peter M, MD;  Location: [facility];  Service: Cardiovascular;  Laterality: N/A;  . FINGER SURGERY Left    "fell; developed skiers thumb; had to operate on it"  . INSERT / REPLACE / [place]   for syptomatic bradycardia and syncope -- in [facility]  . LEAD REVISION/REPAIR N/A 10/09/2016   New left subclavian MDT Adapta L PPM dual chamber system implanted by Dr [name] with previously placed R subclavian system abandoned  . PACEMAKER GENERATOR CHANGE  2001   pulse generator replacement by Dr. Rollene Fare   . PACEMAKER GENERATOR CHANGE  04/01/2008   PPM Medtronic -- model # [id number] serial # [id number] H -- pulse generator replacement by Dr. Verlon Setting   . RIGHT/LEFT HEART CATH AND CORONARY ANGIOGRAPHY N/A 11/27/2016    Procedure: RIGHT/LEFT HEART CATH AND CORONARY ANGIOGRAPHY;  Surgeon: Martinique, Peter M, MD;  Location: [facility];  Service: Cardiovascular;  Laterality: N/A;  . SQUAMOUS CELL CARCINOMA EXCISION    . TONSILLECTOMY      Family History  Problem Relation Age of Onset  . Cancer Father        lung cancer  . Alzheimer's disease Mother     Social History Social History   Tobacco Use  . Smoking status: Former Smoker    Packs/day: 1.50    Years: 30.00    Pack years: 45.00    Types: Cigarettes    Last attempt to quit: 03/05/1978    Years since quitting: 38.9  . Smokeless tobacco: Never Used  Substance Use Topics  . Alcohol use: No  . Drug use: No    Prior to Admission medications   Medication Sig Start Date End Date Taking? Authorizing Provider  acetaminophen (TYLENOL) 500 MG tablet Take 1,000 mg by mouth every 4 (four) hours as needed for moderate pain or headache.    Yes [provider]  amiodarone (PACERONE) 200 MG tablet Take 1/2 tablet (100 mg) by mouth daily Patient taking differently: Take 100 mg by mouth daily.  10/01/16  Yes Allred, Jeneen Rinks, MD  amLODipine (NORVASC) 2.5 MG  tablet Take 1 tablet (2.5 mg total) by mouth daily. 12/22/16  Yes Eileen Stanford, PA-C  apixaban (ELIQUIS) 2.5 MG TABS tablet Take 1 tablet (2.5 mg total) by mouth 2 (two) times daily. 12/17/16  Yes Martinique, Peter M, MD  aspirin EC 81 MG tablet Take 81 mg by mouth daily.   Yes [provider]  calcium carbonate (OS-CAL) 600 MG TABS Take 600 mg by mouth every evening.    Yes [provider]  Cholecalciferol (VITAMIN D) 1000 UNITS capsule Take 1,000 Units by mouth daily.     Yes [provider]  clopidogrel (PLAVIX) 75 MG tablet Take 1 tablet (75 mg total) by mouth daily. Patient taking differently: Take 75 mg by mouth every evening.  12/17/16  Yes Martinique, Peter M, MD  dicyclomine (BENTYL) 10 MG capsule Take 10 mg by mouth 2 (two) times daily as needed for spasms.   06/26/16  Yes [provider]  esomeprazole (NEXIUM) 40 MG capsule Take 40 mg daily as needed by mouth (for acid reflux).    Yes [provider]  furosemide (LASIX) 20 MG tablet Take 1 tablet (20 mg total) by mouth daily as needed (weight gain). Patient taking differently: Take 20 mg by mouth daily as needed (for fluid retention/weight gain.).  02/16/16  Yes Allred, Jeneen Rinks, MD  levothyroxine (SYNTHROID, LEVOTHROID) 100 MCG tablet Take 100 mcg by mouth daily before breakfast.    Yes [provider]  loratadine (CLARITIN) 10 MG tablet Take 10 mg by mouth daily as needed for allergies.   Yes [provider]  metoprolol tartrate (LOPRESSOR) 50 MG tablet Take 1 tablet (50 mg total) 2 (two) times daily by mouth. 01/14/17  Yes Sherran Needs, NP  Multiple Vitamins-Minerals (PRESERVISION AREDS 2 PO) Take 1 capsule 2 (two) times daily by mouth.   Yes [provider]  nitroGLYCERIN (NITROSTAT) 0.4 MG SL tablet Place 1 tablet (0.4 mg total) under the tongue every 5 (five) minutes as needed for chest pain. 12/21/16 03/21/17 Yes Martinique, Peter M, MD  polyvinyl alcohol (LIQUIFILM TEARS) 1.4 % ophthalmic solution Place 1-2 drops into both eyes 3 (three) times daily as needed for dry eyes.   Yes [provider]  rosuvastatin (CRESTOR) 10 MG tablet Take 1 tablet (10 mg total) by mouth daily. 12/17/16 12/12/17 Yes Martinique, Peter M, MD  sodium chloride (OCEAN) 0.65 % SOLN nasal spray Place 1 spray into both nostrils as needed for congestion.   Yes [provider]    Allergies  Allergen Reactions  . Penicillins Anaphylaxis and Other (See Comments)    Has patient had a PCN reaction causing immediate rash, facial/tongue/throat swelling, SOB or lightheadedness with hypotension: Yes Has patient had a PCN reaction causing severe rash involving mucus membranes or skin necrosis: No Has patient had a PCN reaction that required hospitalization: No Has patient had a  PCN reaction occurring within the last 10 years: No If all of the above answers are "NO", then may proceed with Cephalosporin use.   . Tetanus Toxoid Anaphylaxis  . Demerol Other (See Comments)    Severe nausea  . Codeine Nausea Only and Other (See Comments)    Severe nausea     Review of Systems:              General:                      normal appetite, decreased energy, no weight gain, no weight  loss, no fever             Cardiac:                       + chest pain with exertion, no chest pain at rest, + SOB with exertion, no resting SOB, no PND, no orthopnea, no palpitations, no arrhythmia, no atrial fibrillation, no LE edema, + dizzy spells, no syncope             Respiratory:                 + exertional shortness of breath, no home oxygen, + occasional productive cough, no dry cough, no bronchitis, no wheezing, no hemoptysis, no asthma, no pain with inspiration or cough, no sleep apnea, no CPAP at night             GI:                               no difficulty swallowing, no reflux, no frequent heartburn, no hiatal hernia, no abdominal pain, no constipation, no diarrhea, no hematochezia, no hematemesis, no melena             GU:                              no dysuria,  + frequency, no urinary tract infection, no hematuria, no kidney stones, no kidney disease             Vascular:                     no pain suggestive of claudication, no pain in feet, no leg cramps, no varicose veins, no DVT, no non-healing foot ulcer             Neuro:                         no stroke, no TIA's, no seizures, no headaches, no temporary blindness one eye,  no slurred speech, no peripheral neuropathy, no chronic pain, no instability of gait, no memory/cognitive dysfunction             Musculoskeletal:         + arthritis particularly in knees, + joint swelling, no myalgias, no difficulty walking, normal mobility              Skin:                            no rash, no itching, no skin infections, no  pressure sores or ulcerations             Psych:                         no anxiety, no depression, no nervousness, + unusual recent stress             Eyes:                           + blurry vision, no floaters, no recent vision changes, + wears glasses or contacts             ENT:  no hearing loss, no loose or painful teeth, + partial dentures, last saw dentist December 2017             Hematologic:               + easy bruising, no abnormal bleeding, no clotting disorder, no frequent epistaxis             Endocrine:                   no diabetes, does not check CBG's at home                                                       Physical Exam:              BP 122/79   Pulse 63   Resp 16   Ht 5' 5"  (1.651 m)   Wt 126 lb (57.2 kg)   SpO2 98% Comment: ON RA  BMI 20.97 kg/m              General:                      Thin,  well-appearing             HEENT:                       Unremarkable              Neck:                           no JVD, no bruits, no adenopathy              Chest:                          clear to auscultation, symmetrical breath sounds, no wheezes, no rhonchi              CV:                              RRR, grade IV/VI crescendo/decrescendo murmur heard best at RUSB,  no diastolic murmur             Abdomen:                    soft, non-tender, no masses              Extremities:                 warm, well-perfused, pulses diminished but palpable, no LE edema             Rectal/GU                   Deferred             Neuro:                         Grossly non-focal and symmetrical throughout             Skin:  Clean and dry, no rashes, no breakdown   Diagnostic Tests:  Transthoracic Echocardiography  (Report amended )  Patient: [name] [name], [name] [name] MR #: [id number] Study Date: 06/20/2016 Gender: F Age: 31 Height: 167.6 cm Weight: 58 kg BSA: 1.64 m^2 Pt.  Status: Room:  ATTENDING Peter Martinique, M.D. ORDERING Peter Martinique, M.D. REFERRING Peter Martinique, M.D. SONOGRAPHER Oletta Lamas, Will PERFORMING [facility], Outpatient  cc:  ------------------------------------------------------------------- LV EF: 55% - 60%  ------------------------------------------------------------------- Indications: (I35.0).  ------------------------------------------------------------------- History: PMH: Paroxysmal atrial fibrillation. Acquired from the patient and from the patient&'s chart. Dyspnea. Moderate aortic stenosis. Risk factors: Hypertension. Dyslipidemia.  ------------------------------------------------------------------- Study Conclusions  - Left ventricle: The cavity size was normal. There was mild concentric hypertrophy. Systolic function was normal. The estimated ejection fraction was in the range of 55% to 60%. Wall motion was normal; there were no regional wall motion abnormalities. - Aortic valve: Valve mobility was restricted. There was moderate stenosis. There was moderate regurgitation. Peak velocity (S): 370 cm/s. Mean gradient (S): 30 mm Hg. Regurgitation pressure half-time: 449 ms. - Mitral valve: Transvalvular velocity was within the normal range. There was no evidence for stenosis. There was mild regurgitation. - Left atrium: The atrium was moderately dilated. - Right ventricle: The cavity size was normal. Wall thickness was normal. Systolic function was normal. - Tricuspid valve: There was moderate regurgitation. - Pulmonary arteries: Systolic pressure was within the normal range. PA peak pressure: 36 mm Hg (S).  Impressions:  - No significant changes since 08/2015.  ------------------------------------------------------------------- Labs, prior tests, procedures, and surgery: Permanent pacemaker system  implantation.  ------------------------------------------------------------------- Study data: Comparison was made to the study of 08/18/2015. Study status: Routine. Procedure: The patient reported no pain pre or post test. Transthoracic echocardiography for left ventricular function evaluation and for assessment of valvular function. Image quality was adequate. Study completion: There were no complications. Transthoracic echocardiography. M-mode, complete 2D, spectral Doppler, and color Doppler. Birthdate: Patient birthdate: 10/28/1929. Age: Patient is 83 yr old. Sex: Gender: female. BMI: 20.6 kg/m^2. Blood pressure: 121/74 Patient status: Outpatient. Study date: Study date: 06/20/2016. Study time: 10:40 AM. Location: [place] [facility]  -------------------------------------------------------------------  ------------------------------------------------------------------- Left ventricle: The cavity size was normal. There was mild concentric hypertrophy. Systolic function was normal. The estimated ejection fraction was in the range of 55% to 60%. Wall motion was normal; there were no regional wall motion abnormalities. The study was not technically sufficient to allow evaluation of LV diastolic dysfunction due to atrial fibrillation.  ------------------------------------------------------------------- Aortic valve: Trileaflet; severely thickened, severely calcified leaflets. Valve mobility was restricted. Doppler: There was moderate stenosis. There was moderate regurgitation. VTI ratio of LVOT to aortic valve: 0.19. Valve area (VTI): 0.6 cm^2. Indexed valve area (VTI): 0.37 cm^2/m^2. Peak velocity ratio of LVOT to aortic valve: 0.21. Valve area (Vmax): 0.66 cm^2. Indexed valve area (Vmax): 0.4 cm^2/m^2. Mean velocity ratio of LVOT to aortic valve: 0.19. Valve area (Vmean): 0.59 cm^2. Indexed valve area (Vmean): 0.36 cm^2/m^2. Mean  gradient (S): 30 mm Hg. Peak gradient (S): 55 mm Hg.  ------------------------------------------------------------------- Aorta: Aortic root: The aortic root was normal in size.  ------------------------------------------------------------------- Mitral valve: Structurally normal valve. Mobility was not restricted. Doppler: Transvalvular velocity was within the normal range. There was no evidence for stenosis. There was mild regurgitation. Peak gradient (D): 3 mm Hg.  ------------------------------------------------------------------- Left atrium: The atrium was moderately dilated.  ------------------------------------------------------------------- Right ventricle: The cavity size was normal. Wall thickness was normal. Systolic function was normal.  ------------------------------------------------------------------- Pulmonic valve: Structurally normal valve. Cusp separation was  normal. Doppler: Transvalvular velocity was within the normal range. There was no evidence for stenosis. There was mild regurgitation.  ------------------------------------------------------------------- Tricuspid valve: Structurally normal valve. Doppler: Transvalvular velocity was within the normal range. There was moderate regurgitation.  ------------------------------------------------------------------- Pulmonary artery: The main pulmonary artery was normal-sized. Systolic pressure was within the normal range.  ------------------------------------------------------------------- Right atrium: The atrium was normal in size.  ------------------------------------------------------------------- Pericardium: There was no pericardial effusion.  ------------------------------------------------------------------- Systemic veins: Inferior vena cava: The vessel was dilated. The respirophasic diameter changes were in the normal range (= 50%), consistent with elevated  central venous pressure.  ------------------------------------------------------------------- Measurements  Left ventricle Value Reference LV ID, ED, PLAX chordal (L) 37.4 mm 43 - 52 LV ID, ES, PLAX chordal 25.6 mm 23 - 38 LV fx shortening, PLAX chordal 32 % >=29 LV PW thickness, ED 10 mm --------- IVS/LV PW ratio, ED 1.15 <=1.3 Stroke volume, 2D 57 ml --------- Stroke volume/bsa, 2D 35 ml/m^2 --------- LV e&', lateral 7.9 cm/s --------- LV E/e&', lateral 11.16 --------- LV e&', medial 6.34 cm/s --------- LV E/e&', medial 13.91 --------- LV e&', average 7.12 cm/s --------- LV E/e&', average 12.39 ---------  Ventricular septum Value Reference IVS thickness, ED 11.5 mm ---------  LVOT Value Reference LVOT ID, S 20 mm --------- LVOT area 3.14 cm^2 --------- LVOT ID 20 mm --------- LVOT peak velocity, S 77.5 cm/s --------- LVOT mean velocity, S 48.5 cm/s --------- LVOT VTI, S 18 cm --------- LVOT peak gradient, S 2 mm Hg --------- Stroke volume (SV), LVOT DP 56.5 ml --------- Stroke index (SV/bsa), LVOT DP 34.5 ml/m^2 ---------  Aortic valve  Value Reference Aortic valve peak velocity, S 370 cm/s --------- Aortic valve mean velocity, S 257 cm/s --------- Aortic valve VTI, S 93.9 cm --------- Aortic mean gradient, S 30 mm Hg --------- Aortic peak gradient, S 55 mm Hg --------- VTI ratio, LVOT/AV 0.19 --------- Aortic valve area, VTI 0.6 cm^2 --------- Aortic valve area/bsa, VTI 0.37 cm^2/m^2 --------- Velocity ratio, peak, LVOT/AV 0.21 --------- Aortic valve area, peak velocity 0.66 cm^2 --------- Aortic valve area/bsa, peak 0.4 cm^2/m^2 --------- velocity Velocity ratio, mean, LVOT/AV 0.19 --------- Aortic valve area, mean velocity 0.59 cm^2 --------- Aortic valve area/bsa, mean 0.36 cm^2/m^2 --------- velocity Aortic regurg pressure half-time 449 ms ---------  Aorta Value Reference Aortic root ID, ED 33 mm --------- Ascending aorta ID, A-P, S 31 mm ---------  Left atrium Value Reference LA ID, A-P, ES 39 mm --------- LA ID/bsa, A-P (H) 2.38 cm/m^2 <=2.2 LA volume, S 63 ml --------- LA volume/bsa, S 38.4 ml/m^2 --------- LA volume, ES, 1-p A4C 62 ml --------- LA volume/bsa, ES, 1-p A4C 37.8 ml/m^2 --------- LA volume, ES, 1-p A2C 59 ml --------- LA volume/bsa, ES, 1-p A2C 36 ml/m^2  ---------  Mitral valve Value Reference Mitral E-wave peak velocity 88.2 cm/s --------- Mitral A-wave peak velocity 74 cm/s --------- Mitral deceleration time 201 ms 150 - 230 Mitral peak gradient, D 3 mm Hg --------- Mitral E/A ratio, peak 1.2 ---------  Pulmonary arteries Value Reference PA pressure, S, DP (H) 36 mm Hg <=30  Tricuspid valve Value Reference Tricuspid regurg peak velocity 266 cm/s --------- Tricuspid peak RV-RA gradient 28 mm Hg ---------  Systemic veins Value Reference Estimated CVP 8 mm Hg ---------  Right ventricle Value Reference RV pressure, S, DP (H) 36 mm Hg <=30 RV s&', lateral, S 9.07 cm/s ---------  Legend: (L) and (H) mark values outside specified reference range.  ------------------------------------------------------------------- Neena Rhymes, MD 2018-04-18T13:28:41    Transthoracic  Echocardiography  Patient: [name] [name], [name] [name] MR #: [id number] Study Date: 12/26/2016 Gender: F Age: 13 Height: 167.6 cm Weight: 56.7 kg BSA: 1.62 m^2 Pt. Status: Room:  PERFORMING [facility], Outpatient SONOGRAPHER [facility], [facility]CS ATTENDING Nell Range Riley Kill, Katie Sharl Ma, Katie  cc:  ------------------------------------------------------------------- LV EF: 60% - 65%  ------------------------------------------------------------------- Indications: Aortic Stenosis  (I35.0).  ------------------------------------------------------------------- History: PMH: Chest pain. Dyspnea. Atrial fibrillation. Coronary artery disease. Aortic valve disease. Risk factors: Aortic Stenosis, Work up for TAVR, Sick Sinus Syndrome, Chronic Kidney Disease. Former tobacco use. Hypertension. Dyslipidemia.  ------------------------------------------------------------------- Study Conclusions  - Left ventricle: The cavity size was normal. Wall thickness was normal. Systolic function was normal. The estimated ejection fraction was in the range of 60% to 65%. Wall motion was normal; there were no regional wall motion abnormalities. Features are consistent with a pseudonormal left ventricular filling pattern, with concomitant abnormal relaxation and increased filling pressure (grade 2 diastolic dysfunction). - Aortic valve: Moderately to severely calcified annulus. Moderately thickened, moderately calcified leaflets. There was moderate stenosis. There was mild regurgitation. Valve area (VTI): 0.88 cm^2. Valve area (Vmax): 0.78 cm^2. Valve area (Vmean): 0.74 cm^2. - Mitral valve: Calcified annulus. - Right atrium: The atrium was mildly dilated.  ------------------------------------------------------------------- Labs, prior tests, procedures, and surgery: Permanent pacemaker system implantation.  Permanent pacemaker system implantation.  ------------------------------------------------------------------- Study data: Comparison was made to the study of 06/20/2016. Study status: Routine. Procedure: Transthoracic echocardiography. Image quality was adequate. Transthoracic echocardiography. M-mode, complete 2D, 3D, spectral Doppler, and color Doppler. Birthdate: Patient birthdate: 1929-10-28. Age: Patient is 83 yr old. Sex: Gender: female. BMI: 20.2 kg/m^2. Blood pressure: 143/82 Patient status: Outpatient.  Study date: Study date: 12/26/2016. Study time: 02:11 PM. Location: [place] [facility]  -------------------------------------------------------------------  ------------------------------------------------------------------- Left ventricle: Global longitudinal strain = -17.1%.  The cavity size was normal. Wall thickness was normal. Systolic function was normal. The estimated ejection fraction was in the range of 60% to 65%. Wall motion was normal; there were no regional wall motion abnormalities. Features are consistent with a pseudonormal left ventricular filling pattern, with concomitant abnormal relaxation and increased filling pressure (grade 2 diastolic dysfunction).  ------------------------------------------------------------------- Aortic valve: Moderately to severely calcified annulus. Moderately thickened, moderately calcified leaflets. Doppler: There was moderate stenosis. There was mild regurgitation. VTI ratio of LVOT to aortic valve: 0.28. Valve area (VTI): 0.88 cm^2. Indexed valve area (VTI): 0.54 cm^2/m^2. Peak velocity ratio of LVOT to aortic valve: 0.25. Valve area (Vmax): 0.78 cm^2. Indexed valve area (Vmax): 0.48 cm^2/m^2. Mean velocity ratio of LVOT to aortic valve: 0.24. Valve area (Vmean): 0.74 cm^2. Indexed valve area (Vmean): 0.46 cm^2/m^2. Mean gradient (S): 28 mm Hg. Peak gradient (S): 53 mm Hg.  ------------------------------------------------------------------- Aorta: Aortic root: The aortic root was normal in size. Ascending aorta: The ascending aorta was normal in size.  ------------------------------------------------------------------- Mitral valve: Calcified annulus. Doppler: There was no significant regurgitation. Peak gradient (D): 3 mm Hg.  ------------------------------------------------------------------- Left atrium: The atrium was normal in  size.  ------------------------------------------------------------------- Right ventricle: The cavity size was normal. Pacer wire or catheter noted in right ventricle. Systolic function was normal.  ------------------------------------------------------------------- Pulmonic valve: Poorly visualized. Doppler: There was no significant regurgitation.  ------------------------------------------------------------------- Tricuspid valve: Mildly thickened leaflets. Doppler: There was mild regurgitation.  ------------------------------------------------------------------- Pulmonary artery: Systolic pressure was within the normal range.  ------------------------------------------------------------------- Right atrium: The atrium was mildly dilated.  ------------------------------------------------------------------- Pericardium: There was no pericardial effusion.  ------------------------------------------------------------------- Systemic veins: Inferior vena cava: The vessel was normal in size. The respirophasic diameter  changes were in the normal range (>= 50%), consistent with normal central venous pressure.  ------------------------------------------------------------------- Measurements  Left ventricle Value Reference LV ID, ED, PLAX chordal (L) 33 mm 43 - 52 LV ID, ES, PLAX chordal (L) 22.1 mm 23 - 38 LV fx shortening, PLAX chordal 33 % >=29 LV PW thickness, ED 10.7 mm ---------- IVS/LV PW ratio, ED 0.86 <=1.3 Stroke volume, 2D 75 ml ---------- Stroke volume/bsa, 2D 46 ml/m^2 ---------- LV e&', lateral 5.87 cm/s ---------- LV E/e&', lateral 15.78 ---------- LV e&', medial  5.33 cm/s ---------- LV E/e&', medial 17.37 ---------- LV e&', average 5.6 cm/s ---------- LV E/e&', average 16.54 ---------- Longitudinal strain, TDI 17 % ----------  Ventricular septum Value Reference IVS thickness, ED 9.17 mm ----------  LVOT Value Reference LVOT ID, S 20 mm ---------- LVOT area 3.14 cm^2 ---------- LVOT peak velocity, S 90.2 cm/s ---------- LVOT mean velocity, S 56.7 cm/s ---------- LVOT VTI, S 24 cm ----------  Aortic valve Value Reference Aortic valve peak velocity, S 364 cm/s ---------- Aortic valve mean velocity, S 241 cm/s ---------- Aortic valve VTI, S 85.8 cm ---------- Aortic mean gradient, S 28 mm Hg ---------- Aortic peak gradient, S 53 mm Hg ---------- VTI ratio, LVOT/AV 0.28 ---------- Aortic valve area, VTI 0.88 cm^2 ---------- Aortic valve area/bsa, VTI 0.54 cm^2/m^2 ---------- Velocity ratio, peak, LVOT/AV 0.25 ---------- Aortic valve area, peak velocity 0.78 cm^2 ---------- Aortic valve area/bsa, peak 0.48 cm^2/m^2 ---------- velocity Velocity ratio, mean, LVOT/AV 0.24 ---------- Aortic valve area, mean velocity 0.74 cm^2 ---------- Aortic valve area/bsa, mean 0.46 cm^2/m^2  ---------- velocity Aortic regurg pressure half-time 482 ms ----------  Aorta Value Reference Aortic root ID, ED 29 mm ---------- Ascending aorta ID, A-P, S 30 mm ----------  Left atrium Value Reference LA ID, A-P, ES 38 mm ---------- LA ID/bsa, A-P (H) 2.34 cm/m^2 <=2.2 LA volume, S 50.8 ml ---------- LA volume/bsa, S 31.3 ml/m^2 ---------- LA volume, ES, 1-p A4C 53.1 ml ---------- LA volume/bsa, ES, 1-p A4C 32.8 ml/m^2 ---------- LA volume, ES, 1-p A2C 43.8 ml ---------- LA volume/bsa, ES, 1-p A2C 27 ml/m^2 ----------  Mitral valve Value Reference Mitral E-wave peak velocity 92.6 cm/s ---------- Mitral A-wave peak velocity 87.8 cm/s ---------- Mitral deceleration time 218 ms 150 - 230 Mitral peak gradient, D 3 mm Hg ---------- Mitral E/A ratio, peak 1.1 ---------- Mitral regurg VTI, PISA 233 cm ----------  Tricuspid valve Value Reference Tricuspid regurg peak velocity 262 cm/s ---------- Tricuspid peak RV-RA gradient 27 mm Hg ----------  Right atrium Value Reference RA ID, S-I, ES, A4C (H) 53.7 mm 34 - 49 RA area, ES, A4C 19 cm^2 8.3 - 19.5 RA volume, ES, A/L 55 ml ---------- RA volume/bsa, ES, A/L 33.9 ml/m^2  ----------  Right ventricle Value Reference TAPSE 27.4 mm ---------- RV s&', lateral, S 10.6 cm/s ----------  Legend: (L) and (H) mark values outside specified reference range.  ------------------------------------------------------------------- Prepared and Electronically Authenticated by  Mertie Moores, M.D. 2018-10-24T16:48:44   RIGHT/LEFT HEART CATH AND CORONARY ANGIOGRAPHY  Conclusion     Mid LAD lesion, 30 %stenosed.  Mid LAD to Dist LAD lesion, 85 %stenosed.  Prox RCA to Mid RCA lesion, 20 %stenosed.  Dist RCA lesion, 30 %stenosed.  Ost LM lesion, 10 %stenosed.  LV end diastolic pressure is normal.  LV end diastolic pressure is normal.  1. Single vessel obstructive CAD with 85% mid LAD stenosis 2. Severe aortic stenosis. Mean gradient of 40 mm Hg. AVA 0.61 cm squared with index .37. 3. Normal  cardiac output. 4. Normal right heart and LV filling pressures  Plan: will discuss options for AVR. I think her best option based on age and co-morbidities would be TAVR with stenting of mid LAD. Will discuss with heart valve team.   Indications   Nonrheumatic aortic valve stenosis [I35.0 (ICD-10-CM)]  Procedural Details/Technique   Technical Details Indication: 83 yo WF with progressive dyspnea on exertion and aortic stenosis  Procedural Details: The right wrist was prepped, draped, and anesthetized with 1% lidocaine. Using the modified Seldinger technique a 6 Fr slender sheath was placed in the right radial artery and a 5 French sheath was placed in the right brachial vein. We were unable to pass the Swan- Ganz catheter from the right brachial approach due to inability to pass catheter past the old pacing wires. The right groin was prepped, draped and anesthetized with 1% lidocaine. A 7 Fr sheath was placed percutaneously in the right femoral vein.  A Swan-Ganz catheter was used for the right heart catheterization. Standard protocol was followed for recording of right heart pressures and sampling of oxygen saturations. Fick cardiac output was calculated. Standard Judkins catheters were used for selective coronary angiography and left ventricular pressures. There were no immediate procedural complications. The patient was transferred to the post catheterization recovery area for further monitoring.  Contrast: 55 cc   Estimated blood loss <50 mL.  During this procedure the patient was administered the following to achieve and maintain moderate conscious sedation: Versed 1 mg, Fentanyl 25 mcg, while the patient's heart rate, blood pressure, and oxygen saturation were continuously monitored. The period of conscious sedation was 50 minutes, of which I was present face-to-face 100% of this time.    Complications   Complications documented before study signed (11/27/2016 11:04 AM EDT)    No complications were associated with this study.  Documented by Martinique, Peter M, MD - 11/27/2016 11:04 AM EDT    Coronary Findings   Dominance: Right  Left Main  Ost LM lesion, 10% stenosed. The lesion is moderately calcified.  Left Anterior Descending  Mid LAD lesion, 30% stenosed. The lesion is moderately calcified.  Mid LAD to Dist LAD lesion, 85% stenosed.  Left Circumflex  Vessel is small.  Right Coronary Artery  Prox RCA to Mid RCA lesion, 20% stenosed.  Dist RCA lesion, 30% stenosed.  Right Heart   Right Heart Pressures LV EDP is normal.    Left Heart   Left Ventricle LV end diastolic pressure is normal.    Coronary Diagrams   Diagnostic Diagram       Implants        No implant documentation for this case.  MERGE Images   Show images for CARDIAC CATHETERIZATION   Link to Procedure Log   Procedure Log    Hemo Data    Most Recent Value  Fick Cardiac Output 3.62 L/min  Fick Cardiac Output Index 2.22 (L/min)/BSA   Aortic Mean Gradient 39.6 mmHg  Aortic Peak Gradient 38 mmHg  Aortic Valve Area 0.61  Aortic Value Area Index 0.37 cm2/BSA  RA A Wave 9 mmHg  RA V Wave 9 mmHg  RA Mean 6 mmHg  RV Systolic Pressure 34 mmHg  RV Diastolic Pressure 3 mmHg  RV EDP 10 mmHg  PA Systolic Pressure 32 mmHg  PA Diastolic Pressure 11 mmHg  PA Mean 20 mmHg  PW A Wave 18 mmHg  PW V Wave 23 mmHg  PW Mean 16 mmHg  AO Systolic Pressure 423 mmHg  AO Diastolic Pressure 71 mmHg  AO Mean 967 mmHg  LV Systolic Pressure 893 mmHg  LV Diastolic Pressure 5 mmHg  LV EDP 21 mmHg  Arterial Occlusion Pressure Extended Systolic Pressure 810 mmHg  Arterial Occlusion Pressure Extended Diastolic Pressure 67 mmHg  Arterial Occlusion Pressure Extended Mean Pressure 103 mmHg  Left Ventricular Apex Extended Systolic Pressure 175 mmHg  Left Ventricular Apex Extended Diastolic Pressure 3 mmHg  Left Ventricular Apex Extended EDP Pressure 22 mmHg  QP/QS 1  TPVR Index 9 HRUI  TSVR Index 48.14 HRUI  PVR SVR Ratio 0.04  TPVR/TSVR Ratio 0.19    CORONARY STENT INTERVENTION  Conclusion     Mid LAD to Dist LAD lesion, 85 %stenosed.  A STENT PROMUS PREM MR 2.75X16 drug eluting stent was successfully placed.  Post intervention, there is a 0% residual stenosis.  1. Successful stenting of the mid LAD with DES  Plan: IV hydration post procedure. Follow renal function closely. Continue DAPT for one month then discontinue ASA. May start Eliquis tomorrow if no bleeding. Referral for TAVR evaluation.   Indications   Dyspnea on exertion [R06.09 (ICD-10-CM)]  Procedural Details/Technique   Technical Details Indication: 83 yo WF with progressive dyspnea on exertion. She has severe AS and single vessel CAD with 85% mid LAD stenosis. Plan to treat LAD percutaneously then refer for TAVR.  Procedural Details: The right wrist was prepped, draped, and anesthetized with 1% lidocaine. Using the modified Seldinger technique, a 6 Fr  slender sheath was introduced into the radial artery. 3 mg verapamil was administered through the radial sheath. Weight-based heparin was given for anticoagulation. Once a therapeutic ACT was achieved, a 6 [place] XBLAD 3.5 guide catheter was inserted. A prowater coronary guidewire was used to cross the lesion. The lesion was predilated with a 2.5 mm balloon. The lesion was then stented with a 2.75 x 16 mm Promus premier stent. The stent was postdilated with a 2.75 noncompliant balloon to 18 atm. Following PCI, there was 0% residual stenosis and TIMI-3 flow. Final angiography confirmed an excellent result. The patient tolerated the procedure well. There were no immediate procedural complications. A TR band was used for radial hemostasis. The patient was transferred to the post catheterization recovery area for further monitoring.  Contrast: 80 cc     Estimated blood loss <50 mL.  During this procedure the patient was administered the following to achieve and maintain moderate conscious sedation: Versed 1 mg, Fentanyl 25 mcg, while the patient's heart rate, blood pressure, and oxygen saturation were continuously monitored. The period of conscious sedation was 44 minutes, of which I was present face-to-face 100% of this time.    Complications   Complications documented before study signed (12/20/2016 12:11 PM EDT)    No complications were associated with this study.  Documented by [place], Peter M, MD - 12/20/2016 12:08 PM EDT    Coronary Findings   Dominance: Right  Left Main  Ost LM lesion, 10% stenosed. The lesion is moderately calcified.  Left Anterior Descending  Mid LAD lesion, 30% stenosed. The lesion is moderately calcified.  Mid LAD to Dist LAD lesion, 85% stenosed.  Angioplasty: Lesion crossed with guidewire using a WIRE ASAHI PROWATER 180CM. Pre-stent angioplasty was performed using a BALLOON SAPPHIRE 2.5X12. A STENT PROMUS PREM MR 2.75X16 drug eluting stent was successfully  placed. Stent strut is well apposed. Post-stent angioplasty was performed using a BALLOON SAPPHIRE Mc[place]ee 2.75X10. Maximum pressure: 18 atm. The pre-interventional distal flow is normal (TIMI 3). The  post-interventional distal flow is normal (TIMI 3). The intervention was successful . No complications occurred at this lesion.  There is no residual stenosis post intervention.  Left Circumflex  Vessel is small.  Right Coronary Artery  Prox RCA to Mid RCA lesion, 20% stenosed.  Dist RCA lesion, 30% stenosed.  Coronary Diagrams   Diagnostic Diagram       Post-Intervention Diagram         Cardiac TAVR CT  TECHNIQUE: The patient was scanned on a Siemens 192 scanner. A 120 kV retrospective scan was triggered in the Ascending thoracic aorta at 140 HU's. Gantry rotation speed was 250 msecs and collimation was .6 mm. No beta blockade or nitro were given. The 3D data set was reconstructed in 5% intervals of the R-R cycle. Systolic and diastolic phases were analyzed on a dedicated work station using MPR, MIP and VRT modes. The patient received 80 cc of contrast. Due to CRF a bicarbonate infusion was given before study  FINDINGS: Aortic Valve: Tri leaflet and heavily calcified with restricted systolic motion  Aorta: Marked calcific atherosclerosis of the arch, left subclavian and descending thoracic aorta  Sinotubular Junction: 24 mm  Ascending Thoracic Aorta: 32 mm  Aortic Arch: 26 mm  Descending Thoracic Aorta: 26 mm  Sinus of Valsalva Measurements:  Non-coronary: 29 mm  Right -coronary: 28 mm  Left -coronary: 29 mm  Coronary Artery Height above Annulus:  Left Main: 11.5 mm above annulus  Right Coronary: 10.1 mm above annulus  Virtual Basal Annulus Measurements:  Maximum/Minimum Diameter: 24 mm x 20.8 mm  Perimeter: 71 mm  Area: 392 mm 2  Coronary Arteries: Sufficient height above annulus for deployment patent stent in mid LAD  Although RCA origin is a bit shallow  Optimum Fluoroscopic Angle for Delivery: LAO 33 degrees Caudal 4 degrees  IMPRESSION: 1) Calcified tri leaflet aortic valve with annular area of 392 mm 2 suitable for a 23 mm Sapiens 3 valve and perimeter of 71 mm suitable for a 26 mm Evolut Pro valve  2) Coronary arteries sufficient height above annulus for deployment although RCA is a bit shallow  3) Marked calcific atherosclerosis of the aortic arch, left subclavian origin and descending thoracic aorta  4) Optimum angiographic angle for deployment LAO 33 degrees Caudal 4 degrees  5) No LAA thrombus  6) Pacing wires seen in RA/RV  7) Patient stent in mid LAD  Jenkins Rouge   Electronically Signed By: Jenkins Rouge M.D. On: 12/31/2016 17:49   CT ANGIOGRAPHY CHEST, ABDOMEN AND PELVIS  TECHNIQUE: Multidetector CT imaging through the chest, abdomen and pelvis was performed using the standard protocol during bolus administration of intravenous contrast. Multiplanar reconstructed images and MIPs were obtained and reviewed to evaluate the vascular anatomy.  CONTRAST: 60 cc Isovue 370 IV.  COMPARISON: 06/19/2016 CT abdomen/pelvis. 10/10/2016 chest radiograph.  FINDINGS: CTA CHEST FINDINGS  Cardiovascular: Mild cardiomegaly. No significant pericardial fluid/thickening. Left main, left anterior descending and right coronary artery calcifications. Abandoned 2 lead right subclavian pacer leads are noted with lead tips in the right atrium and right ventricle. Two lead left subclavian pacemaker is noted with lead tips in the right atrium and right ventricular apex. Atherosclerotic nonaneurysmal thoracic aorta. No evidence of aortic dissection, acute intramural hematoma, pseudoaneurysm or penetrating atherosclerotic ulcer. Main pulmonary artery diameter 3.0 cm, top-normal. No central pulmonary emboli. Severe thickening and calcification of the aortic  valve.  Mediastinum/Nodes: No discrete thyroid nodules. Unremarkable esophagus. No pathologically enlarged axillary, mediastinal or hilar lymph  nodes.  Lungs/Pleura: No pneumothorax. No pleural effusion. There is mild scattered cylindrical and varicoid bronchiectasis throughout both lungs, most prominent in the medial segment right middle lobe and lingula. There is associated diffuse bronchial wall thickening and scattered mild tree-in-bud opacities throughout both lungs. Subpleural bandlike opacities in the medial segment right middle lobe and inferior segment lingula with associated mild volume loss and distortion are compatible with mild postinfectious scarring. No acute consolidative airspace disease or lung masses.  Musculoskeletal: No aggressive appearing focal osseous lesions. Moderate lower thoracic spondylosis.  CTA ABDOMEN AND PELVIS FINDINGS  Hepatobiliary: Normal liver with no liver mass. Normal gallbladder with no radiopaque cholelithiasis. No biliary ductal dilatation.  Pancreas: Normal, with no mass or duct dilation.  Spleen: Normal size. No mass.  Adrenals/Urinary Tract: Normal adrenals. Simple 0.8 cm renal cyst in the interpolar left kidney. Otherwise normal kidneys, with no overt hydronephrosis. Normal bladder.  Stomach/Bowel: Grossly normal stomach. Normal caliber small bowel with no small bowel wall thickening. Normal appendix. Moderate sigmoid diverticulosis, no large bowel wall thickening or pericolonic fat stranding.  Vascular/Lymphatic: Atherosclerotic nonaneurysmal abdominal aorta. Patent splenic and renal veins. No pathologically enlarged lymph nodes in the abdomen or pelvis.  Reproductive: Status post hysterectomy, with no abnormal findings at the vaginal cuff. No adnexal mass.  Other: No pneumoperitoneum, ascites or focal fluid collection.  Musculoskeletal: No aggressive appearing focal osseous lesions. Moderate lumbar  spondylosis, most prominent at the superior L1 endplate.  VASCULAR MEASUREMENTS PERTINENT TO TAVR:  AORTA:  Minimal Aortic Diameter - 10.9 mm  Severity of Aortic Calcification - moderate to severe  RIGHT PELVIS:  Right Common Iliac Artery -  Minimal Diameter - 8.9 x 6.0 mm  Tortuosity - mild  Calcification - moderate  Right External Iliac Artery -  Minimal Diameter - 7.5 x 6.3 mm  Tortuosity - mild-to-moderate  Calcification - none  Right Common Femoral Artery -  Minimal Diameter - 7.7 x 5.7 mm  Tortuosity - mild  Calcification - mild-to-moderate  LEFT PELVIS:  Left Common Iliac Artery -  Minimal Diameter - 7.7 x 6.2 mm  Tortuosity - mild  Calcification - moderate  Left External Iliac Artery -  Minimal Diameter - 7.1 x 6.4 mm  Tortuosity - mild-to-moderate  Calcification - none  Left Common Femoral Artery -  Minimal Diameter - 8.4 x 6.1 mm  Tortuosity - mild  Calcification - mild-to-moderate  Review of the MIP images confirms the above findings.  IMPRESSION: 1. Vascular findings and measurements burden and to potential TAVR procedure, as detailed above. 2. Severe thickening and calcification of the aortic valve, compatible with the reported clinical history of severe aortic stenosis. 3. Spectrum of findings most compatible with chronic infectious bronchiolitis due to atypical mycobacterial infection (MAI), with mild cylindrical and varicoid bronchiectasis, mild tree-in-bud opacities and mild postinfectious scarring throughout both lungs, most prominent in the mid lungs. 4. Moderate sigmoid diverticulosis. 5. Aortic Atherosclerosis (ICD10-I70.0).   Electronically Signed By: Ilona Sorrel M.D. On: 01/01/2017 09:49   STS Risk Calculator  Procedure: AV Replacement  Risk of Mortality: 8.2%  Morbidity or Mortality: 30.941%  Long Length of Stay: 16.601%  Short Length of Stay: 12.099%   Permanent Stroke: 2.967%  Prolonged Ventilation: 23.364%  DSW Infection: 0.181%  Renal Failure: 8.399%  Reoperation: 10.982%    Procedure: AV Replacement + CAB  Risk of Mortality: 9.114%  Morbidity or Mortality: 36.184%  Long Length of Stay: 22.403%  Short Length of Stay: 8.31%  Permanent Stroke: 3.032%  Prolonged Ventilation: 25.651%  DSW Infection: 0.247%  Renal Failure: 12.107%  Reoperation: 12.802%     Impression:  Patient has stage D severe symptomatic aortic stenosis and single-vessel coronary artery disease. She presents with symptoms of exertional shortness of breath and chest discomfort consistent with chronic diastolic congestive heart failure and stable angina pectoris, [place] Heart Association functional class II.  I have personally reviewed the patient's most recent transthoracic echocardiogram, diagnostic cardiac catheterization, and CT angiograms. Echocardiogram demonstrated the presence of aortic stenosis with severe thickening, calcification, and restricted leaflet mobility involving all 3 leaflets of the aortic valve. Peak velocity across the aortic valve measured 3.7 m/s corresponding to mean transvalvular gradient estimated 30 mmHg. The DVI was quite low and mean transvalvular gradient at catheterization was directly measured 40 mmHg corresponding to aortic valve area calculated only 0.61 cm. Left ventricular systolic function remains normal. The patient subsequently underwent PCI and stenting of the left anterior descending coronary artery with angiographically good result. She otherwise has only mild nonobstructive coronary artery disease and her symptoms have persisted. I agree that aortic valve replacement using indicated. Risks associated with conventional surgery would be at least moderately elevated because of the patient's advanced age and comorbid medical problems.  Cardiac-gated CTA of the heart reveals anatomical characteristics consistent with aortic  stenosis suitable for treatment by transcatheter aortic valve replacement without any significant complicating features and CTA of the aorta and iliac vessels demonstrate what appears to be adequate pelvic vascular access to facilitate a transfemoral approach.    Plan:  The patient and her sister-in-law were counseled at length regarding treatment alternatives for management of severe symptomatic aortic stenosis. Alternative approaches such as conventional aortic valve replacement, transcatheter aortic valve replacement, and palliative medical therapy were compared and contrasted at length.  The risks associated with conventional surgical aortic valve replacement were been discussed in detail, as were expectations for post-operative convalescence.  Issues specific to transcatheter aortic valve replacement were discussed including questions about long term valve durability, the potential for paravalvular leak, possible increased risk of need for permanent pacemaker placement, and other technical complications related to the procedure itself.  Long-term prognosis with medical therapy was discussed. This discussion was placed in the context of the patient's own specific clinical presentation and past medical history.  All of their questions been addressed.  The patient hopes to proceed with transcatheter aortic valve replacement in the near future. We tentatively plan to proceed on 01/29/2017. The patient has been instructed to stop taking Eliquis 7 days prior to surgery. She will remain on aspirin and Plavix until the time of surgery. Between now and the time of surgery she will be referred for a second surgical consultation.  Expectations for her recovery following transcatheter aortic valve replacement have been discussed.  All of her questions been addressed.   Valentina Gu. Roxy Manns, MD 01/02/2017 3:24 PM

## 2017-01-28 NOTE — Progress Notes (Signed)
Anesthesia Chart Review: Patient is a 83 year old female scheduled for TAVR, transfemoral approarch on 01/29/17 by Dr. Sherren Mocha. Had 2nd TAVR evaluation with CT surgeon Dr. Gilford Raid.  History includes former smoker (quit '80), post-operative N/V (severe), HLD, HTN, CAD s/p DES LAD 12/20/16, SSS s/p Medtronic PPM (right atrail lead failure s/p new dual chamber PPM 10/09/16), severe AS, PAF, hypothyroidism, GERD, CKD stage III, skin cancer excision (BCC, SCC), tonsillectomy, hysterectomy.   - PCP is Dr. [name]. - Primary cardiologist is Dr. Peter Martinique. - EP cardiologist is Dr. Thompson Grayer. Last visit 01/14/17. She had normal pacemaker function at that time.  Meds include Eliquis (last dose pre-op 01/21/17), Plavix, ASA 81 mg, amiodarone, amlodipine, Bentyl, Nexium, Lasix, levothyroxine, Claritin, Lopressor, Nitro, Crestor. Per Dr. Vivi Martens 01/10/17 office note, patient to discontinue Eliquis 7 days prior to surgery and continue Plavix and ASA.  BP 131/66   Pulse 80   Temp 36.5 C   Resp 20   Ht 5\' 6"  (1.676 m)   Wt 128 lb (58.1 kg)   SpO2 100%   BMI 20.66 kg/m   EKG 01/14/17: Atrial paced rhythm with prolonged AV conduction.   Echo 12/26/16: Study Conclusions - Left ventricle: The cavity size was normal. Wall thickness was   normal. Systolic function was normal. The estimated ejection   fraction was in the range of 60% to 65%. Wall motion was normal;   there were no regional wall motion abnormalities. Features are   consistent with a pseudonormal left ventricular filling pattern,   with concomitant abnormal relaxation and increased filling   pressure (grade 2 diastolic dysfunction). - Aortic valve: Moderately to severely calcified annulus.   Moderately thickened, moderately calcified leaflets. There was   moderate stenosis. There was mild regurgitation. Valve area   (VTI): 0.88 cm^2. Valve area (Vmax): 0.78 cm^2. Valve area   (Vmean): 0.74 cm^2. - Mitral valve:  Calcified annulus. - Right atrium: The atrium was mildly dilated.  Cardiac cath 11/27/16:  Mid LAD lesion, 30 %stenosed.  Mid LAD to Dist LAD lesion, 85 %stenosed.  Prox RCA to Mid RCA lesion, 20 %stenosed.  Dist RCA lesion, 30 %stenosed.  Ost LM lesion, 10 %stenosed.  LV end diastolic pressure is normal.  LV end diastolic pressure is normal.  1. Single vessel obstructive CAD with 85% mid LAD stenosis 2. Severe aortic stenosis. Mean gradient of 40 mm Hg. AVA 0.61 cm squared with index .37. 3. Normal cardiac output. 4. Normal right heart and LV filling pressures Plan: will discuss options for AVR. I think her best option based on age and co-morbidities would be TAVR with stenting of mid LAD. Will discuss with heart valve team. PCI 12/20/16: 1. Successful stenting (Promus Prem MR 2.75 X 16) of the mid LAD. Plan: IV hydration post procedure. Follow renal function closely. Continue DAPT for one month then discontinue ASA. May start Eliquis tomorrow if no bleeding. Referral for TAVR evaluation.  Coronary CT 12/31/16: IMPRESSION: 1) Calcified tri leaflet aortic valve with annular area of 392 mm 2 suitable for a 23 mm Sapiens 3 valve and perimeter of 71 mm suitable for a 26 mm Evolut Pro valve 2) Coronary arteries sufficient height above annulus for deployment although RCA is a bit shallow 3) Marked calcific atherosclerosis of the aortic arch, left subclavian origin and descending thoracic aorta 4) Optimum angiographic angle for deployment LAO 33 degrees Caudal 4 degrees 5) No LAA thrombus 6) Pacing wires seen in RA/RV 7) Patient stent  in mid LAD  Carotid U/S 12/27/16: Final Interpretation: Right Carotid: There is evidence in the right ICA of a 1-39% stenosis. Left Carotid: There is evidence in the left ICA of a 1-39% stenosis. Vertebrals: Both vertebral arteries were patent with antegrade flow.  CTA chest/abd/pelvis 12/31/16: IMPRESSION: 1. Vascular findings and measurements  burden and to potential TAVR procedure, as detailed above (see full report). 2. Severe thickening and calcification of the aortic valve, compatible with the reported clinical history of severe aortic stenosis. 3. Spectrum of findings most compatible with chronic infectious bronchiolitis due to atypical mycobacterial infection (MAI), with mild cylindrical and varicoid bronchiectasis, mild tree-in-bud opacities and mild postinfectious scarring throughout both lungs, most prominent in the mid lungs. 4. Moderate sigmoid diverticulosis. 5.  Aortic Atherosclerosis (ICD10-I70.0).  CXR 01/28/17: IMPRESSION: Chronic lung changes.  No infiltrate or congestive heart failure. Pacemaker leads in place as noted above. Aortic Atherosclerosis (ICD10-I70.0).  PFTs 02/14/16: FVC 1.85 (72%), FEV1 1.28 (68%), DLCO unc 12.25 (47%).  Preoperative labs noted. Cr 1.37 (stable since at least 06/2015), H/H 9.6/32.6 (stable since ~ 10/2016), PLT 252K. PT/PTT WNL. Glucose 87, A1c still PENDING. BNP 522. T&S done. ABG 7.46, pCO2 36.2, pO2 63.7, HCO3 25.4. UA showed trace leukocytes, negative nitrites.    Overall, labs appear stable over the past several months. Will defer decision for transfusion for anemia to surgeons and/or anesthesiologist. Anesthesiologist to evaluate on the day of surgery.  George Hugh [facility] Short Stay Center/Anesthesiology Phone [phone] 01/28/2017 4:48 PM

## 2017-01-28 NOTE — Telephone Encounter (Signed)
New Message:     Sharyn Lull from Short Stay at [facility] needs to speak to a nurse for Dr Romie Levee is there.ro

## 2017-01-28 NOTE — Pre-Procedure Instructions (Addendum)
Atalie Oros Bartle  01/28/2017      [facility] [place] - [address], [address] AT [facility] [address] [place] [address] Phone: [phone] Fax: [phone]    Your procedure is scheduled on 01/29/2017 .  Report to [facility] Admitting at 5:30 A.M.  Call this number if you have problems the morning of surgery:  [phone]   Remember:  Do not eat food or drink liquids after midnight.  On Monday Night    Take these medicines the morning of surgery with A SIP OF WATER:  Acetaminophen (Tylenol) - if needed  Amlodipine (Norvasc) Dicyclomine (Bentyl) - if needed Esomeprazole (Nexium) levothryoxine (Synthroid) Loratidine (Claritin) - if needed Nose Spray if needed Eye drops if needed  7 days prior to surgery STOP taking any Aspirin(unless otherwise instructed by your surgeon), Aleve, Naproxen, Ibuprofen, Motrin, Advil, Goody's, BC's, all herbal medications, fish oil, and all vitamins  Follow your doctors instructions regarding your Aspirin, Eliquis, and Plavix.  If no instructions were given by your doctor, then you will need to call the prescribing office office to get instructions.      Do not wear jewelry, make-up or nail polish.   Do not wear lotions, powders, or perfumes, or deoderant.   Do not shave 48 hours prior to surgery.     Do not bring valuables to the hospital.   [facility] is not responsible for any belongings or valuables.  Contacts, eyeglasses, dentures or bridgework may not be worn into surgery.  Leave your suitcase in the car.  After surgery it may be brought to your room.  For patients admitted to the hospital, discharge time will be determined by your treatment team.  Patients discharged the day of surgery will not be allowed to drive home.   Name and phone number of your driver:     Special instructions:   [place]- Preparing For Surgery  Before surgery, you can play an  important role. Because skin is not sterile, your skin needs to be as free of germs as possible. You can reduce the number of germs on your skin by washing with CHG (chlorahexidine gluconate) Soap before surgery.  CHG is an antiseptic cleaner which kills germs and bonds with the skin to continue killing germs even after washing.  Please do not use if you have an allergy to CHG or antibacterial soaps. If your skin becomes reddened/irritated stop using the CHG.  Do not shave (including legs and underarms) for at least 48 hours prior to first CHG shower. It is OK to shave your face.  Please follow these instructions carefully.   1. Shower the NIGHT BEFORE SURGERY and the MORNING OF SURGERY with CHG.   2. If you chose to wash your hair, wash your hair first as usual with your normal shampoo.  3. After you shampoo, rinse your hair and body thoroughly to remove the shampoo.  4. Use CHG as you would any other liquid soap. You can apply CHG directly to the skin and wash gently with a scrungie or a clean washcloth.   5. Apply the CHG Soap to your body ONLY FROM THE NECK DOWN.  Do not use on open wounds or open sores. Avoid contact with your eyes, ears, mouth and genitals (private parts). Wash Face and genitals (private parts)  with your normal soap.  6. Wash thoroughly, paying special attention to the area where your  surgery will be performed.  7. Thoroughly rinse your body with warm water from the neck down.  8. DO NOT shower/wash with your normal soap after using and rinsing off the CHG Soap.  9. Pat yourself dry with a CLEAN TOWEL.  10. Wear CLEAN PAJAMAS to bed the night before surgery, wear comfortable clothes the morning of surgery  11. Place CLEAN SHEETS on your bed the night of your first shower and DO NOT SLEEP WITH PETS.    Day of Surgery: Shower as stated above. Do not apply any deodorants/lotions.  Please wear clean clothes to the hospital/surgery center.     Please read over  the following fact sheets that you were given. Coughing and Deep Breathing, MRSA Information and Surgical Site Infection Prevention

## 2017-01-28 NOTE — Telephone Encounter (Signed)
Sharyn Lull from [employer]. She states that the patient is there for her pre-admit appointment for her TAVR scheduled with Dr. Burt Knack tomorrow. She is calling to confirm what medicines the patient is suppose to take the morning of her procedure. Discussed with Lauren, RN who states that typically patients hold all medicines the morning of the procedure unless otherwise instructed by the surgeon. Per Dr. Guy Sandifer OV note from 10/31, patient is suppose to take Synthroid and Norvasc only the morning of. Made Sharyn Lull aware and she verbalized understanding.

## 2017-01-28 NOTE — Progress Notes (Signed)
PCP - Dr. Virgina Jock Cardiologist - Dr. Martinique EP - Dr. [name]  Chest x-ray - 01/28/2017 EKG - 01/14/2017 Stress Test -  ECHO - 12/26/2016 Cardiac Cath - 11/27/2016; PCI 12/20/2016  Sleep Study - patient denies   Blood Thinner Instructions: Per Dr. Vivi Martens note 01/10/2017, patient to stop Eliquis 01/21/2017, continue plavix and ASA   Anesthesia review: yes, cardia hx  Patient denies shortness of breath, fever, cough and chest pain at PAT appointment   Patient verbalized understanding of instructions that were given to them at the PAT appointment. Patient was also instructed that they will need to review over the PAT instructions again at home before surgery.

## 2017-01-29 ENCOUNTER — Inpatient Hospital Stay (HOSPITAL_COMMUNITY): Payer: Medicare Other | Admitting: Certified Registered Nurse Anesthetist

## 2017-01-29 ENCOUNTER — Inpatient Hospital Stay (HOSPITAL_COMMUNITY)
Admission: RE | Admit: 2017-01-29 | Discharge: 2017-02-01 | DRG: 266 | Disposition: A | Discharge status: Home Health | Payer: Medicare Other | Source: Ambulatory Visit | Attending: Cardiovascular Disease | Admitting: Cardiovascular Disease

## 2017-01-29 ENCOUNTER — Inpatient Hospital Stay (HOSPITAL_COMMUNITY)
Admission: RE | Admit: 2017-01-29 | Discharge: 2017-01-29 | Disposition: A | Discharge status: Home / Self Care | Payer: Medicare Other | Source: Ambulatory Visit | Attending: Cardiovascular Disease | Admitting: Cardiovascular Disease

## 2017-01-29 ENCOUNTER — Encounter (HOSPITAL_COMMUNITY): Payer: Self-pay

## 2017-01-29 ENCOUNTER — Inpatient Hospital Stay (HOSPITAL_COMMUNITY): Payer: Medicare Other | Admitting: Vascular Surgery

## 2017-01-29 ENCOUNTER — Encounter (HOSPITAL_COMMUNITY)
Admission: RE | Disposition: A | Discharge status: Home Health | Payer: Self-pay | Source: Ambulatory Visit | Attending: Cardiovascular Disease

## 2017-01-29 ENCOUNTER — Inpatient Hospital Stay (HOSPITAL_COMMUNITY): Payer: Medicare Other

## 2017-01-29 DIAGNOSIS — D649 Anemia, unspecified: Secondary | ICD-10-CM | POA: Diagnosis present

## 2017-01-29 DIAGNOSIS — Z885 Allergy status to narcotic agent status: Secondary | ICD-10-CM | POA: Diagnosis not present

## 2017-01-29 DIAGNOSIS — Z887 Allergy status to serum and vaccine status: Secondary | ICD-10-CM

## 2017-01-29 DIAGNOSIS — E039 Hypothyroidism, unspecified: Secondary | ICD-10-CM | POA: Diagnosis present

## 2017-01-29 DIAGNOSIS — I300 Acute nonspecific idiopathic pericarditis: Secondary | ICD-10-CM | POA: Diagnosis not present

## 2017-01-29 DIAGNOSIS — D62 Acute posthemorrhagic anemia: Secondary | ICD-10-CM

## 2017-01-29 DIAGNOSIS — R42 Dizziness and giddiness: Secondary | ICD-10-CM | POA: Diagnosis present

## 2017-01-29 DIAGNOSIS — I2510 Atherosclerotic heart disease of native coronary artery without angina pectoris: Secondary | ICD-10-CM | POA: Diagnosis present

## 2017-01-29 DIAGNOSIS — I10 Essential (primary) hypertension: Secondary | ICD-10-CM | POA: Diagnosis present

## 2017-01-29 DIAGNOSIS — Z85828 Personal history of other malignant neoplasm of skin: Secondary | ICD-10-CM

## 2017-01-29 DIAGNOSIS — K219 Gastro-esophageal reflux disease without esophagitis: Secondary | ICD-10-CM | POA: Diagnosis present

## 2017-01-29 DIAGNOSIS — Z7982 Long term (current) use of aspirin: Secondary | ICD-10-CM

## 2017-01-29 DIAGNOSIS — I130 Hypertensive heart and chronic kidney disease with heart failure and stage 1 through stage 4 chronic kidney disease, or unspecified chronic kidney disease: Secondary | ICD-10-CM | POA: Diagnosis present

## 2017-01-29 DIAGNOSIS — Z87891 Personal history of nicotine dependence: Secondary | ICD-10-CM | POA: Diagnosis not present

## 2017-01-29 DIAGNOSIS — I5033 Acute on chronic diastolic (congestive) heart failure: Secondary | ICD-10-CM

## 2017-01-29 DIAGNOSIS — E785 Hyperlipidemia, unspecified: Secondary | ICD-10-CM | POA: Diagnosis present

## 2017-01-29 DIAGNOSIS — Z006 Encounter for examination for normal comparison and control in clinical research program: Secondary | ICD-10-CM | POA: Diagnosis not present

## 2017-01-29 DIAGNOSIS — Z950 Presence of cardiac pacemaker: Secondary | ICD-10-CM | POA: Diagnosis not present

## 2017-01-29 DIAGNOSIS — I319 Disease of pericardium, unspecified: Secondary | ICD-10-CM

## 2017-01-29 DIAGNOSIS — I350 Nonrheumatic aortic (valve) stenosis: Secondary | ICD-10-CM

## 2017-01-29 DIAGNOSIS — Z955 Presence of coronary angioplasty implant and graft: Secondary | ICD-10-CM | POA: Diagnosis not present

## 2017-01-29 DIAGNOSIS — I4891 Unspecified atrial fibrillation: Secondary | ICD-10-CM | POA: Diagnosis present

## 2017-01-29 DIAGNOSIS — Z79899 Other long term (current) drug therapy: Secondary | ICD-10-CM

## 2017-01-29 DIAGNOSIS — Z79890 Hormone replacement therapy (postmenopausal): Secondary | ICD-10-CM | POA: Diagnosis not present

## 2017-01-29 DIAGNOSIS — Z7901 Long term (current) use of anticoagulants: Secondary | ICD-10-CM

## 2017-01-29 DIAGNOSIS — Z952 Presence of prosthetic heart valve: Secondary | ICD-10-CM

## 2017-01-29 DIAGNOSIS — Z7902 Long term (current) use of antithrombotics/antiplatelets: Secondary | ICD-10-CM

## 2017-01-29 DIAGNOSIS — N189 Chronic kidney disease, unspecified: Secondary | ICD-10-CM | POA: Diagnosis present

## 2017-01-29 DIAGNOSIS — I309 Acute pericarditis, unspecified: Secondary | ICD-10-CM | POA: Diagnosis not present

## 2017-01-29 DIAGNOSIS — I340 Nonrheumatic mitral (valve) insufficiency: Secondary | ICD-10-CM | POA: Diagnosis not present

## 2017-01-29 DIAGNOSIS — Z880 Allergy status to penicillin: Secondary | ICD-10-CM | POA: Diagnosis not present

## 2017-01-29 DIAGNOSIS — I480 Paroxysmal atrial fibrillation: Secondary | ICD-10-CM | POA: Diagnosis present

## 2017-01-29 HISTORY — DX: Presence of prosthetic heart valve: Z95.2

## 2017-01-29 HISTORY — PX: TRANSCATHETER AORTIC VALVE REPLACEMENT, TRANSFEMORAL: SHX6400

## 2017-01-29 HISTORY — PX: TEE WITHOUT CARDIOVERSION: SHX5443

## 2017-01-29 LAB — HEMOGLOBIN AND HEMATOCRIT, BLOOD
HEMATOCRIT: 32.4 % — AB (ref 36.0–46.0)
HEMOGLOBIN: 10.1 g/dL — AB (ref 12.0–15.0)

## 2017-01-29 LAB — CBC
HEMATOCRIT: 25.8 % — AB (ref 36.0–46.0)
HEMOGLOBIN: 7.8 g/dL — AB (ref 12.0–15.0)
MCH: 25.7 pg — ABNORMAL LOW (ref 26.0–34.0)
MCHC: 30.2 g/dL (ref 30.0–36.0)
MCV: 85.1 fL (ref 78.0–100.0)
Platelets: 189 10*3/uL (ref 150–400)
RBC: 3.03 MIL/uL — AB (ref 3.87–5.11)
RDW: 16.8 % — ABNORMAL HIGH (ref 11.5–15.5)
WBC: 8.5 10*3/uL (ref 4.0–10.5)

## 2017-01-29 LAB — POCT I-STAT, CHEM 8
BUN: 28 mg/dL — AB (ref 6–20)
BUN: 26 mg/dL — ABNORMAL HIGH (ref 6–20)
BUN: 27 mg/dL — AB (ref 6–20)
CALCIUM ION: 1.29 mmol/L (ref 1.15–1.40)
CHLORIDE: 105 mmol/L (ref 101–111)
CHLORIDE: 104 mmol/L (ref 101–111)
CHLORIDE: 104 mmol/L (ref 101–111)
Calcium, Ion: 1.31 mmol/L (ref 1.15–1.40)
Calcium, Ion: 1.27 mmol/L (ref 1.15–1.40)
Creatinine, Ser: 1.2 mg/dL — ABNORMAL HIGH (ref 0.44–1.00)
Creatinine, Ser: 1.1 mg/dL — ABNORMAL HIGH (ref 0.44–1.00)
Creatinine, Ser: 1.1 mg/dL — ABNORMAL HIGH (ref 0.44–1.00)
GLUCOSE: 116 mg/dL — AB (ref 65–99)
Glucose, Bld: 106 mg/dL — ABNORMAL HIGH (ref 65–99)
Glucose, Bld: 117 mg/dL — ABNORMAL HIGH (ref 65–99)
HCT: 23 % — ABNORMAL LOW (ref 36.0–46.0)
HEMATOCRIT: 26 % — AB (ref 36.0–46.0)
HEMATOCRIT: 22 % — AB (ref 36.0–46.0)
HEMOGLOBIN: 7.5 g/dL — AB (ref 12.0–15.0)
Hemoglobin: 8.8 g/dL — ABNORMAL LOW (ref 12.0–15.0)
Hemoglobin: 7.8 g/dL — ABNORMAL LOW (ref 12.0–15.0)
POTASSIUM: 4.2 mmol/L (ref 3.5–5.1)
Potassium: 4.7 mmol/L (ref 3.5–5.1)
Potassium: 4.4 mmol/L (ref 3.5–5.1)
SODIUM: 139 mmol/L (ref 135–145)
SODIUM: 139 mmol/L (ref 135–145)
Sodium: 137 mmol/L (ref 135–145)
TCO2: 25 mmol/L (ref 22–32)
TCO2: 26 mmol/L (ref 22–32)
TCO2: 27 mmol/L (ref 22–32)

## 2017-01-29 LAB — HEMOGLOBIN A1C
Hgb A1c MFr Bld: 5.5 % (ref 4.8–5.6)
Mean Plasma Glucose: 111 mg/dL

## 2017-01-29 LAB — POCT I-STAT 3, ART BLOOD GAS (G3+)
Acid-Base Excess: 2 mmol/L (ref 0.0–2.0)
Acid-Base Excess: 3 mmol/L — ABNORMAL HIGH (ref 0.0–2.0)
Bicarbonate: 26.4 mmol/L (ref 20.0–28.0)
Bicarbonate: 27.2 mmol/L (ref 20.0–28.0)
O2 SAT: 98 %
O2 SAT: 100 %
PCO2 ART: 40.1 mmHg (ref 32.0–48.0)
PCO2 ART: 40.6 mmHg (ref 32.0–48.0)
PH ART: 7.426 (ref 7.350–7.450)
PH ART: 7.432 (ref 7.350–7.450)
PO2 ART: 111 mmHg — AB (ref 83.0–108.0)
PO2 ART: 223 mmHg — AB (ref 83.0–108.0)
Patient temperature: 97.7
TCO2: 28 mmol/L (ref 22–32)
TCO2: 28 mmol/L (ref 22–32)

## 2017-01-29 LAB — POCT I-STAT 4, (NA,K, GLUC, HGB,HCT)
GLUCOSE: 108 mg/dL — AB (ref 65–99)
HEMATOCRIT: 23 % — AB (ref 36.0–46.0)
HEMOGLOBIN: 7.8 g/dL — AB (ref 12.0–15.0)
POTASSIUM: 4.3 mmol/L (ref 3.5–5.1)
SODIUM: 137 mmol/L (ref 135–145)

## 2017-01-29 LAB — PROTIME-INR
INR: 1.23
Prothrombin Time: 15.4 seconds — ABNORMAL HIGH (ref 11.4–15.2)

## 2017-01-29 LAB — PREPARE RBC (CROSSMATCH)

## 2017-01-29 LAB — APTT: APTT: 32 s (ref 24–36)

## 2017-01-29 SURGERY — IMPLANTATION, AORTIC VALVE, TRANSCATHETER, FEMORAL APPROACH
Anesthesia: General | Site: Chest

## 2017-01-29 MED ORDER — SODIUM CHLORIDE 0.9 % IV SOLN
INTRAVENOUS | Status: DC
  Administered 2017-01-29: 10:00:00 via INTRAVENOUS

## 2017-01-29 MED ORDER — LACTATED RINGERS IV SOLN: 500.0000 mL | Freq: Once | INTRAVENOUS | Status: DC | PRN

## 2017-01-29 MED ORDER — TRAMADOL HCL 50 MG PO TABS
50.0000 mg | ORAL_TABLET | ORAL | Status: DC | PRN
  Administered 2017-01-29: 50 mg via ORAL
  Filled 2017-01-29: qty 1

## 2017-01-29 MED ORDER — CHLORHEXIDINE GLUCONATE 4 % EX LIQD: 60.0000 mL | Freq: Once | CUTANEOUS | Status: DC

## 2017-01-29 MED ORDER — DIAZEPAM 2 MG PO TABS
2.0000 mg | ORAL_TABLET | Freq: Four times a day (QID) | ORAL | Status: DC | PRN
  Administered 2017-01-31: 2 mg via ORAL
  Filled 2017-01-29 (×2): qty 1

## 2017-01-29 MED ORDER — AMIODARONE HCL 100 MG PO TABS
100.0000 mg | ORAL_TABLET | Freq: Every day | ORAL | Status: DC
  Administered 2017-01-29 – 2017-01-30 (×2): 100 mg via ORAL
  Filled 2017-01-29 (×2): qty 1

## 2017-01-29 MED ORDER — SODIUM CHLORIDE 0.9 % IV SOLN
Freq: Once | INTRAVENOUS | Status: AC
  Administered 2017-01-29: 14:00:00 via INTRAVENOUS

## 2017-01-29 MED ORDER — DICYCLOMINE HCL 10 MG PO CAPS
10.0000 mg | ORAL_CAPSULE | Freq: Two times a day (BID) | ORAL | Status: DC | PRN
  Administered 2017-01-29 – 2017-01-30 (×3): 10 mg via ORAL
  Filled 2017-01-29 (×3): qty 1

## 2017-01-29 MED ORDER — CHLORHEXIDINE GLUCONATE 0.12 % MT SOLN
OROMUCOSAL | Status: AC
  Administered 2017-01-29: 15 mL via OROMUCOSAL
  Filled 2017-01-29: qty 15

## 2017-01-29 MED ORDER — ROSUVASTATIN CALCIUM 10 MG PO TABS
10.0000 mg | ORAL_TABLET | Freq: Every day | ORAL | Status: DC
  Administered 2017-01-29 – 2017-02-01 (×4): 10 mg via ORAL
  Filled 2017-01-29 (×4): qty 1

## 2017-01-29 MED ORDER — ASPIRIN EC 81 MG PO TBEC: 81.0000 mg | DELAYED_RELEASE_TABLET | Freq: Every day | ORAL | Status: DC

## 2017-01-29 MED ORDER — SODIUM CHLORIDE 0.9 % IV SOLN
INTRAVENOUS | Status: DC | PRN
  Administered 2017-01-29: 1500 mL

## 2017-01-29 MED ORDER — VANCOMYCIN HCL IN DEXTROSE 1-5 GM/200ML-% IV SOLN
1000.0000 mg | Freq: Once | INTRAVENOUS | Status: AC
  Administered 2017-01-29: 1000 mg via INTRAVENOUS
  Filled 2017-01-29: qty 200

## 2017-01-29 MED ORDER — FENTANYL CITRATE (PF) 250 MCG/5ML IJ SOLN
INTRAMUSCULAR | Status: AC
  Filled 2017-01-29: qty 5

## 2017-01-29 MED ORDER — NITROGLYCERIN IN D5W 200-5 MCG/ML-% IV SOLN: 0.0000 ug/min | INTRAVENOUS | Status: DC

## 2017-01-29 MED ORDER — ONDANSETRON HCL 4 MG/2ML IJ SOLN
INTRAMUSCULAR | Status: DC | PRN
  Administered 2017-01-29: 4 mg via INTRAVENOUS

## 2017-01-29 MED ORDER — METOPROLOL TARTRATE 5 MG/5ML IV SOLN: 2.5000 mg | INTRAVENOUS | Status: DC | PRN

## 2017-01-29 MED ORDER — CLOPIDOGREL BISULFATE 75 MG PO TABS
75.0000 mg | ORAL_TABLET | Freq: Every evening | ORAL | Status: DC
  Administered 2017-01-30 – 2017-01-31 (×2): 75 mg via ORAL
  Filled 2017-01-29 (×3): qty 1

## 2017-01-29 MED ORDER — CHLORHEXIDINE GLUCONATE 0.12 % MT SOLN
15.0000 mL | Freq: Once | OROMUCOSAL | Status: AC
  Administered 2017-01-29 – 2017-01-30 (×2): 15 mL via OROMUCOSAL

## 2017-01-29 MED ORDER — PROPOFOL 10 MG/ML IV BOLUS
INTRAVENOUS | Status: DC | PRN
  Administered 2017-01-29 (×4): 10 mg via INTRAVENOUS

## 2017-01-29 MED ORDER — LACTATED RINGERS IV SOLN
INTRAVENOUS | Status: DC | PRN
  Administered 2017-01-29: 07:00:00 via INTRAVENOUS

## 2017-01-29 MED ORDER — LEVOFLOXACIN IN D5W 750 MG/150ML IV SOLN
750.0000 mg | INTRAVENOUS | Status: AC
  Administered 2017-01-30: 750 mg via INTRAVENOUS
  Filled 2017-01-29: qty 150

## 2017-01-29 MED ORDER — LIDOCAINE HCL (PF) 1 % IJ SOLN
INTRAMUSCULAR | Status: AC
  Filled 2017-01-29: qty 30

## 2017-01-29 MED ORDER — FENTANYL CITRATE (PF) 250 MCG/5ML IJ SOLN
INTRAMUSCULAR | Status: DC | PRN
  Administered 2017-01-29: 50 ug via INTRAVENOUS

## 2017-01-29 MED ORDER — SODIUM CHLORIDE 0.9 % IV SOLN: INTRAVENOUS | Status: DC

## 2017-01-29 MED ORDER — PANTOPRAZOLE SODIUM 40 MG PO TBEC
40.0000 mg | DELAYED_RELEASE_TABLET | Freq: Every day | ORAL | Status: DC
  Administered 2017-01-29 – 2017-02-01 (×4): 40 mg via ORAL
  Filled 2017-01-29 (×4): qty 1

## 2017-01-29 MED ORDER — 0.9 % SODIUM CHLORIDE (POUR BTL) OPTIME
TOPICAL | Status: DC | PRN
  Administered 2017-01-29: 1000 mL

## 2017-01-29 MED ORDER — ALBUMIN HUMAN 5 % IV SOLN: 250.0000 mL | INTRAVENOUS | Status: DC | PRN

## 2017-01-29 MED ORDER — HEPARIN SODIUM (PORCINE) 1000 UNIT/ML IJ SOLN
INTRAMUSCULAR | Status: DC | PRN
  Administered 2017-01-29: 8000 [IU] via INTRAVENOUS

## 2017-01-29 MED ORDER — PROTAMINE SULFATE 10 MG/ML IV SOLN
INTRAVENOUS | Status: DC | PRN
  Administered 2017-01-29: 10 mg via INTRAVENOUS
  Administered 2017-01-29: 70 mg via INTRAVENOUS

## 2017-01-29 MED ORDER — LORATADINE 10 MG PO TABS: 10.0000 mg | ORAL_TABLET | Freq: Every day | ORAL | Status: DC | PRN

## 2017-01-29 MED ORDER — ONDANSETRON HCL 4 MG/2ML IJ SOLN
4.0000 mg | Freq: Four times a day (QID) | INTRAMUSCULAR | Status: DC | PRN
  Administered 2017-01-29 – 2017-01-30 (×3): 4 mg via INTRAVENOUS
  Filled 2017-01-29 (×3): qty 2

## 2017-01-29 MED ORDER — CHLORHEXIDINE GLUCONATE 4 % EX LIQD: 30.0000 mL | CUTANEOUS | Status: DC

## 2017-01-29 MED ORDER — ORAL CARE MOUTH RINSE
15.0000 mL | Freq: Two times a day (BID) | OROMUCOSAL | Status: DC
  Administered 2017-01-29 (×2): 15 mL via OROMUCOSAL

## 2017-01-29 MED ORDER — LIDOCAINE HCL 1 % IJ SOLN
INTRAMUSCULAR | Status: DC | PRN
  Administered 2017-01-29: 8 mL via INTRADERMAL

## 2017-01-29 MED ORDER — FENTANYL CITRATE (PF) 100 MCG/2ML IJ SOLN
12.5000 ug | INTRAMUSCULAR | Status: DC | PRN
  Administered 2017-01-30 (×2): 12.5 ug via INTRAVENOUS
  Filled 2017-01-29 (×2): qty 2

## 2017-01-29 MED ORDER — LEVOTHYROXINE SODIUM 100 MCG PO TABS
100.0000 ug | ORAL_TABLET | Freq: Every day | ORAL | Status: DC
  Administered 2017-01-30 – 2017-02-01 (×3): 100 ug via ORAL
  Filled 2017-01-29 (×3): qty 1

## 2017-01-29 MED ORDER — ACETAMINOPHEN 650 MG RE SUPP: 650.0000 mg | Freq: Once | RECTAL | Status: DC

## 2017-01-29 MED ORDER — IODIXANOL 320 MG/ML IV SOLN
INTRAVENOUS | Status: DC | PRN
  Administered 2017-01-29: 50.7 mL via INTRAVENOUS

## 2017-01-29 MED ORDER — ACETAMINOPHEN 160 MG/5ML PO SOLN: 650.0000 mg | Freq: Once | ORAL | Status: DC

## 2017-01-29 MED ORDER — SODIUM CHLORIDE 0.9 % IV SOLN
0.0000 ug/min | INTRAVENOUS | Status: DC
  Administered 2017-01-29: 20 ug/min via INTRAVENOUS
  Filled 2017-01-29: qty 2

## 2017-01-29 SURGICAL SUPPLY — 104 items
ADAPTER UNIV SWAN GANZ BIP (ADAPTER) ×2 IMPLANT
ADAPTER UNV SWAN GANZ BIP (ADAPTER) ×2
ADH SKN CLS APL DERMABOND .7 (GAUZE/BANDAGES/DRESSINGS) ×2
ADPR CATH UNV NS SG CATH (ADAPTER) ×2
ATTRACTOMAT 16X20 MAGNETIC DRP (DRAPES) IMPLANT
BAG BANDED W/RUBBER/TAPE 36X54 (MISCELLANEOUS) ×4 IMPLANT
BAG DECANTER FOR FLEXI CONT (MISCELLANEOUS) IMPLANT
BAG EQP BAND 135X91 W/RBR TAPE (MISCELLANEOUS) ×2
BAG SNAP BAND KOVER 36X36 (MISCELLANEOUS) ×8 IMPLANT
BLADE 10 SAFETY STRL DISP (BLADE) ×4 IMPLANT
BLADE CLIPPER SURG (BLADE) IMPLANT
BLADE STERNUM SYSTEM 6 (BLADE) ×4 IMPLANT
CABLE ADAPT CONN TEMP 6FT (ADAPTER) ×4 IMPLANT
CABLE PACING FASLOC BIEGE (MISCELLANEOUS) ×4 IMPLANT
CABLE PACING FASLOC BLUE (MISCELLANEOUS) ×4 IMPLANT
CANISTER SUCT 3000ML PPV (MISCELLANEOUS) IMPLANT
CANNULA FEM VENOUS REMOTE 22FR (CANNULA) IMPLANT
CANNULA OPTISITE PERFUSION 16F (CANNULA) IMPLANT
CANNULA OPTISITE PERFUSION 18F (CANNULA) IMPLANT
CATH DIAG EXPO 6F VENT PIG 145 (CATHETERS) ×8 IMPLANT
CATH EXPO 5FR AL1 (CATHETERS) ×4 IMPLANT
CATH S G BIP PACING (SET/KITS/TRAYS/PACK) ×8 IMPLANT
CLIP VESOCCLUDE MED 24/CT (CLIP) ×4 IMPLANT
CLIP VESOCCLUDE SM WIDE 24/CT (CLIP) ×4 IMPLANT
CONT SPEC 4OZ CLIKSEAL STRL BL (MISCELLANEOUS) ×8 IMPLANT
COVER BACK TABLE 60X90IN (DRAPES) ×4 IMPLANT
COVER BACK TABLE 80X110 HD (DRAPES) ×4 IMPLANT
COVER DOME SNAP 22 D (MISCELLANEOUS) ×4 IMPLANT
COVER MAYO STAND STRL (DRAPES) ×4 IMPLANT
CRADLE DONUT ADULT HEAD (MISCELLANEOUS) ×4 IMPLANT
DERMABOND ADVANCED (GAUZE/BANDAGES/DRESSINGS) ×2
DERMABOND ADVANCED .7 DNX12 (GAUZE/BANDAGES/DRESSINGS) ×2 IMPLANT
DEVICE CLOSURE PERCLS PRGLD 6F (VASCULAR PRODUCTS) ×4 IMPLANT
DRAPE INCISE IOBAN 66X45 STRL (DRAPES) IMPLANT
DRAPE SLUSH MACHINE 52X66 (DRAPES) ×4 IMPLANT
DRSG ADAPTIC 3X8 NADH LF (GAUZE/BANDAGES/DRESSINGS) ×2 IMPLANT
DRSG TEGADERM 4X4.75 (GAUZE/BANDAGES/DRESSINGS) ×4 IMPLANT
ELECT REM PT RETURN 9FT ADLT (ELECTROSURGICAL) ×8
ELECTRODE REM PT RTRN 9FT ADLT (ELECTROSURGICAL) ×4 IMPLANT
FELT TEFLON 6X6 (MISCELLANEOUS) ×4 IMPLANT
FEMORAL VENOUS CANN RAP (CANNULA) IMPLANT
GAUZE SPONGE 4X4 12PLY STRL (GAUZE/BANDAGES/DRESSINGS) ×4 IMPLANT
GAUZE SPONGE 4X4 12PLY STRL LF (GAUZE/BANDAGES/DRESSINGS) ×2 IMPLANT
GLOVE BIO SURGEON STRL SZ7.5 (GLOVE) ×4 IMPLANT
GLOVE BIO SURGEON STRL SZ8 (GLOVE) ×8 IMPLANT
GLOVE EUDERMIC 7 POWDERFREE (GLOVE) ×4 IMPLANT
GLOVE ORTHO TXT STRL SZ7.5 (GLOVE) ×4 IMPLANT
GOWN STRL REUS W/ TWL LRG LVL3 (GOWN DISPOSABLE) ×6 IMPLANT
GOWN STRL REUS W/ TWL XL LVL3 (GOWN DISPOSABLE) ×12 IMPLANT
GOWN STRL REUS W/TWL LRG LVL3 (GOWN DISPOSABLE) ×12
GOWN STRL REUS W/TWL XL LVL3 (GOWN DISPOSABLE) ×24
GUIDEWIRE SAF TJ AMPL .035X180 (WIRE) ×4 IMPLANT
GUIDEWIRE SAFE TJ AMPLATZ EXST (WIRE) ×4 IMPLANT
GUIDEWIRE STRAIGHT .035 260CM (WIRE) ×4 IMPLANT
INSERT FOGARTY 61MM (MISCELLANEOUS) ×4 IMPLANT
INSERT FOGARTY SM (MISCELLANEOUS) IMPLANT
INSERT FOGARTY XLG (MISCELLANEOUS) IMPLANT
KIT BASIN OR (CUSTOM PROCEDURE TRAY) ×4 IMPLANT
KIT DILATOR VASC 18G NDL (KITS) IMPLANT
KIT HEART LEFT (KITS) ×4 IMPLANT
KIT ROOM TURNOVER OR (KITS) ×4 IMPLANT
KIT SUCTION CATH 14FR (SUCTIONS) ×8 IMPLANT
NDL PERC 18GX7CM (NEEDLE) ×2 IMPLANT
NEEDLE PERC 18GX7CM (NEEDLE) ×4 IMPLANT
NS IRRIG 1000ML POUR BTL (IV SOLUTION) ×12 IMPLANT
PACK AORTA (CUSTOM PROCEDURE TRAY) ×4 IMPLANT
PAD ARMBOARD 7.5X6 YLW CONV (MISCELLANEOUS) ×8 IMPLANT
PAD ELECT DEFIB RADIOL ZOLL (MISCELLANEOUS) ×4 IMPLANT
PATCH TACHOSII LRG 9.5X4.8 (VASCULAR PRODUCTS) IMPLANT
PERCLOSE PROGLIDE 6F (VASCULAR PRODUCTS) ×8
SET MICROPUNCTURE 5F STIFF (MISCELLANEOUS) ×4 IMPLANT
SHEATH AVANTI 11CM 8FR (MISCELLANEOUS) ×4 IMPLANT
SHEATH PINNACLE 6F 10CM (SHEATH) ×8 IMPLANT
SLEEVE REPOSITIONING LENGTH 30 (MISCELLANEOUS) ×4 IMPLANT
SPONGE LAP 4X18 X RAY DECT (DISPOSABLE) ×4 IMPLANT
STOPCOCK MORSE 400PSI 3WAY (MISCELLANEOUS) ×24 IMPLANT
SUT ETHIBOND X763 2 0 SH 1 (SUTURE) IMPLANT
SUT GORETEX CV 4 TH 22 36 (SUTURE) IMPLANT
SUT GORETEX CV4 TH-18 (SUTURE) IMPLANT
SUT GORETEX TH-18 36 INCH (SUTURE) IMPLANT
SUT MNCRL AB 3-0 PS2 18 (SUTURE) IMPLANT
SUT PROLENE 3 0 SH1 36 (SUTURE) IMPLANT
SUT PROLENE 4 0 RB 1 (SUTURE)
SUT PROLENE 4-0 RB1 .5 CRCL 36 (SUTURE) IMPLANT
SUT PROLENE 5 0 C 1 36 (SUTURE) IMPLANT
SUT PROLENE 6 0 C 1 30 (SUTURE) IMPLANT
SUT SILK  1 MH (SUTURE) ×2
SUT SILK 1 MH (SUTURE) ×2 IMPLANT
SUT SILK 2 0 SH CR/8 (SUTURE) IMPLANT
SUT VIC AB 2-0 CT1 27 (SUTURE)
SUT VIC AB 2-0 CT1 TAPERPNT 27 (SUTURE) IMPLANT
SUT VIC AB 2-0 CTX 36 (SUTURE) IMPLANT
SUT VIC AB 3-0 SH 8-18 (SUTURE) IMPLANT
SYR 10ML LL (SYRINGE) ×12 IMPLANT
SYR 30ML LL (SYRINGE) ×8 IMPLANT
SYR 50ML LL SCALE MARK (SYRINGE) ×4 IMPLANT
TOWEL OR 17X26 10 PK STRL BLUE (TOWEL DISPOSABLE) ×8 IMPLANT
TRANSDUCER W/STOPCOCK (MISCELLANEOUS) ×8 IMPLANT
TRAY FOLEY SILVER 14FR TEMP (SET/KITS/TRAYS/PACK) ×4 IMPLANT
TUBE SUCT INTRACARD DLP 20F (MISCELLANEOUS) IMPLANT
TUBING HIGH PRESSURE 120CM (CONNECTOR) ×4 IMPLANT
VALVE HEART TRANSCATH SZ3 23MM (Prosthesis & Implant Heart) ×2 IMPLANT
WIRE AMPLATZ SS-J .035X180CM (WIRE) ×4 IMPLANT
WIRE BENTSON .035X145CM (WIRE) ×4 IMPLANT

## 2017-01-29 NOTE — Anesthesia Procedure Notes (Signed)
Arterial Line Insertion Start/End11/27/2018 6:50 AM, 01/29/2017 6:55 AM Performed by: Oleta Mouse, MD, Bryson Corona, CRNA, CRNA  Patient location: Pre-op. Preanesthetic checklist: patient identified, IV checked, site marked, risks and benefits discussed, surgical consent, monitors and equipment checked, pre-op evaluation, timeout performed and anesthesia consent Lidocaine 1% used for infiltration and patient sedated radial was placed Catheter size: 20 G Hand hygiene performed , maximum sterile barriers used  and Seldinger technique used  Attempts: 1 Procedure performed without using ultrasound guided technique. Following insertion, dressing applied and Biopatch. Post procedure assessment: normal  Patient tolerated the procedure well with no immediate complications.

## 2017-01-29 NOTE — Progress Notes (Signed)
Echocardiogram 2D Echocardiogram has been performed.  [name] [name] 01/29/2017, 9:14 AM

## 2017-01-29 NOTE — Anesthesia Procedure Notes (Signed)
Central Venous Catheter Insertion Performed by: Lillia Abed, MD, anesthesiologist Start/End11/27/2018 6:45 AM, 01/29/2017 7:00 AM Patient location: Pre-op. Preanesthetic checklist: patient identified, IV checked, risks and benefits discussed, surgical consent, monitors and equipment checked, pre-op evaluation, timeout performed and anesthesia consent Position: Trendelenburg Lidocaine 1% used for infiltration and patient sedated Hand hygiene performed  and maximum sterile barriers used  Catheter size: 8 Fr Total catheter length 16. Central line was placed.Double lumen Procedure performed using ultrasound guided technique. Ultrasound Notes:anatomy identified, needle tip was noted to be adjacent to the nerve/plexus identified, no ultrasound evidence of intravascular and/or intraneural injection and image(s) printed for medical record Attempts: 1 Following insertion, dressing applied, line sutured and Biopatch. Post procedure assessment: blood return through all ports  Patient tolerated the procedure well with no immediate complications.

## 2017-01-29 NOTE — Op Note (Signed)
HEART AND VASCULAR CENTER   MULTIDISCIPLINARY HEART VALVE TEAM   TAVR OPERATIVE NOTE   Date of Procedure:  01/29/2017  Preoperative Diagnosis: Severe Aortic Stenosis   Postoperative Diagnosis: Same   Procedure:    Transcatheter Aortic Valve Replacement - Percutaneous Right Transfemoral Approach  Edwards Sapien 3 THV (size 23 mm, model # 9600TFX, serial # [id number])   Co-Surgeons:  Valentina Gu. Roxy Manns, MD and Sherren Mocha, MD  Anesthesiologist:  Laurie Panda, MD  Echocardiographer:  Jenkins Rouge, MD  Pre-operative Echo Findings:  Severe aortic stenosis  Normal left ventricular systolic function  Post-operative Echo Findings:  No paravalvular leak  Normal left ventricular systolic function   BRIEF CLINICAL NOTE AND INDICATIONS FOR SURGERY  Patient is an 83 year old female with aortic stenosis, coronary artery disease, atrial fibrillation on long-term anticoagulation, tachybradycardia syndrome status post permanent pacemaker placement,hypertension, and hyperlipidemia who has been referred for surgical consultation to discuss treatment options for management of severe symptomatic aortic stenosis. The patient has a long history of atrial fibrillation and tachybradycardia syndrome and she underwent permanent pacemaker placement in the remote past and this had a variety of complications requiring generator change and ultimately pacemaker replacement. She has history of paroxysmal atrial fibrillation and has been followed for several years in the atrial fibrillation clinic. Previous echocardiograms have demonstrated the presence of moderate aortic stenosis with normal left ventricular systolic function. Echocardiogram performed 06/20/2016 revealed peak velocity across the aortic valve measured 3.7 m/s corresponding to mean transvalvular gradient estimated 30 mmHg. The DVI was notably only 0.19. Left ventricular systolic function remained normal with ejection fraction estimated 55-60%.  Over the past 4-6 months the patient has developed symptoms of exertional shortness of breath and chest discomfort. She was seen in the atrial fibrillation clinic and noted to be in persistentatrialfibrillation. She was started on amiodarone and converted to sinus rhythm. Several months ago she was seen in the atrial fibrillation clinic and noted to have failure of her right atrial lead for her pacemaker. She ultimately underwent explant and placement of a new dual-chamber pacemaker on 10/09/2016. Despite this the patient continued to complain of symptoms of exertional shortness of breath and chest discomfort. She was seen in follow-up by Dr. Martinique in early September and subsequently underwent diagnostic cardiac catheterization on 11/27/2016. She was found to have severe aortic stenosis with severe single-vessel coronary artery disease. Mean transvalvular gradient across the aortic valve was measured 40 mmHg corresponding to aortic valve area calculated 0.61 cm. There was normal cardiac output and normal right heart and left ventricular filling pressures. Coronary angiography revealed 85% stenosis of the mid left anterior descending coronary artery with otherwise mild nonobstructive coronary artery disease. The patient was referred to the multidisciplinary heart valve teamand subsequently underwent PCI and stenting of the left anterior descending coronary artery using a drug eluding stent on 12/20/2016. She was subsequently referred for surgical consultation.  During the course of the patient's preoperative work up they have been evaluated comprehensively by a multidisciplinary team of specialists coordinated through the [facility] in the [facility].  They have been demonstrated to suffer from symptomatic severe aortic stenosis as noted above. The patient has been counseled extensively as to the relative risks and benefits of all options for the treatment  of severe aortic stenosis including long term medical therapy, conventional surgery for aortic valve replacement, and transcatheter aortic valve replacement.  All questions have been answered, and the patient provides full informed consent for  the operation as described.   DETAILS OF THE OPERATIVE PROCEDURE  PREPARATION:    The patient is brought to the operating room on the above mentioned date and central monitoring was established by the anesthesia team including placement of a central venous line and radial arterial line. The patient is placed in the supine position on the operating table.  Intravenous antibiotics are administered. The patient is monitored closely throughout the procedure under conscious sedation.  Baseline transthoracic echocardiogram was performed. The patient's chest, abdomen, both groins, and both lower extremities are prepared and draped in a sterile manner. A time out procedure is performed.   PERIPHERAL ACCESS:    Using the modified Seldinger technique, femoral arterial and venous access was obtained with placement of 6 Fr sheaths on the left side.  A pigtail diagnostic catheter was passed through the left arterial sheath under fluoroscopic guidance into the aortic root.  A temporary transvenous pacemaker catheter was passed through the left femoral venous sheath under fluoroscopic guidance into the right ventricle.  The pacemaker was tested to ensure stable lead placement and pacemaker capture. Aortic root angiography was performed in order to determine the optimal angiographic angle for valve deployment.   TRANSFEMORAL ACCESS:   Percutaneous transfemoral access and sheath placement was performed by Dr. Burt Knack using ultrasound guidance.  The right common femoral artery was cannulated using a micropuncture needle and appropriate location was verified using hand injection angiogram.  A pair of Abbott Perclose percutaneous closure devices were placed and a 6 French sheath  replaced into the femoral artery.  The patient was heparinized systemically and ACT verified > 250 seconds.    A 14 Fr transfemoral E-sheath was introduced into the right femoral artery after progressively dilating over an Amplatz superstiff wire. An AL-2 catheter was used to direct a straight-tip exchange length wire across the native aortic valve into the left ventricle. This was exchanged out for a pigtail catheter and position was confirmed in the LV apex. Simultaneous LV and Ao pressures were recorded.  The pigtail catheter was exchanged for an Amplatz Extra-stiff wire in the LV apex.  Echocardiography was utilized to confirm appropriate wire position and no sign of entanglement in the mitral subvalvular apparatus.   TRANSCATHETER HEART VALVE DEPLOYMENT:   An Edwards Sapien 3 transcatheter heart valve (size 23 mm, model #9600TFX, serial #[id number]) was prepared and crimped per manufacturer's guidelines, and the proper orientation of the valve is confirmed on the [employer] delivery system. The valve was advanced through the introducer sheath using normal technique until in an appropriate position in the abdominal aorta beyond the sheath tip. The balloon was then retracted and using the fine-tuning wheel was centered on the valve. The valve was then advanced across the aortic arch using appropriate flexion of the catheter. The valve was carefully positioned across the aortic valve annulus. The Commander catheter was retracted using normal technique. Once final position of the valve has been confirmed by angiographic assessment, the valve is deployed while temporarily holding ventilation and during rapid ventricular pacing to maintain systolic blood pressure < 50 mmHg and pulse pressure < 10 mmHg. The balloon inflation is held for >3 seconds after reaching full deployment volume. Once the balloon has fully deflated the balloon is retracted into the ascending aorta and valve function is assessed using  echocardiography. There is felt to be no paravalvular leak and no central aortic insufficiency.  The patient's hemodynamic recovery following valve deployment is good.  The deployment balloon and guidewire  are both removed.    PROCEDURE COMPLETION:   The sheath was removed and femoral artery closure performed by Dr Burt Knack.  Protamine was administered once femoral arterial repair was complete. The temporary pacemaker, pigtail catheters and femoral sheaths were removed with manual pressure used for hemostasis.   The patient tolerated the procedure well and is transported to the surgical intensive care in stable condition. There were no immediate intraoperative complications. All sponge instrument and needle counts are verified correct at completion of the operation.   No blood products were administered during the operation.  The patient received a total of 50.7 mL of intravenous contrast during the procedure.   Rexene Alberts, MD 01/29/2017 9:09 AM

## 2017-01-29 NOTE — Transfer of Care (Signed)
Immediate Anesthesia Transfer of Care Note  Patient: [name] [name]  Procedure(s) Performed: TRANSCATHETER AORTIC VALVE REPLACEMENT, TRANSFEMORAL (N/A Chest) TRANSESOPHAGEAL ECHOCARDIOGRAM (TEE) (N/A )  Patient Location: ICU  Anesthesia Type:MAC  Level of Consciousness: awake, alert  and oriented  Airway & Oxygen Therapy: Patient Spontanous Breathing and Patient connected to face mask oxygen  Post-op Assessment: Report given to RN and Post -op Vital signs reviewed and stable  Post vital signs: Reviewed and stable  Last Vitals:  Vitals:   01/29/17 0610 01/29/17 0612  BP: (!) 183/79   Pulse: 76   Resp: 16   Temp: 36.5 C   SpO2:  99%    Last Pain:  Vitals:   01/29/17 0610  TempSrc: Oral      Patients Stated Pain Goal: 1 (67/28/97 9150)  Complications: No apparent anesthesia complications

## 2017-01-29 NOTE — Progress Notes (Signed)
  [place] VALVE TEAM  Patient doing well s/p TAVR. She is hemodynamically stable, now off pressors. She received 1 U PRBCS for acute post op anemia. Repeat H/H this evening. Groin sites stable. Plan to DC arterial line in an hour or so if BP remains normal. She has been having significant muscle spasms and feels "miserable". Will try low dose of valium to see if this helps. Early ambulation and plan to transfer to tele tomorrow if she remains stable.   Angelena Form PA-C  [facility]  Pager [phone]

## 2017-01-29 NOTE — Op Note (Signed)
  [facility]   MULTIDISCIPLINARY HEART VALVE TEAM   TAVR OPERATIVE NOTE   Date of Procedure:  01/29/2017  Preoperative Diagnosis: Severe Aortic Stenosis   Postoperative Diagnosis: Same   Procedure:    Transcatheter Aortic Valve Replacement - Percutaneous Transfemoral Approach  Edwards Sapien 3 THV (size 26 mm, model # 9600TFX, serial # [id number])   Co-Surgeons:  Valentina Gu. Roxy Manns, MD and Sherren Mocha, MD  Anesthesiologist:  Laurie Panda, MD  Echocardiographer:  Jenkins Rouge, MD  Pre-operative Echo Findings:  Severe aortic stenosis  Normal left ventricular systolic function  Post-operative Echo Findings:  No paravalvular leak  Normal left ventricular systolic function  INDICATION, PATIENT HISTORY, AND FULL OPERATIVE DETAILS: PLEASE SEE THE FULL OPERATIVE NOTE OF DR Roxy Manns  PERIPHERAL ACCESS:   Using ultrasound guidance, femoral arterial and venous access is obtained with placement of 6 Fr sheaths on the left side.  A pigtail diagnostic catheter was passed through the femoral arterial sheath under fluoroscopic guidance into the aortic root.  A temporary transvenous pacemaker catheter was passed through the femoral venous sheath under fluoroscopic guidance into the right ventricle.  The pacemaker was tested to ensure stable lead placement and pacemaker capture. Aortic root angiography was performed in order to determine the optimal angiographic angle for valve deployment.  TRANSFEMORAL ACCESS:  A micropuncture technique is used to access the right femoral artery under fluoroscopic and ultrasound guidance.  2 Perclose devices are deployed at 10' and 2' positions to 'PreClose' the femoral artery. An 8 French sheath is placed and then an Amplatz Superstiff wire is advanced through the sheath. This is changed out for a 14 French transfemoral E-Sheath after progressively dilating over the Superstiff wire.    PROCEDURE COMPLETION:  The sheath was removed and femoral  artery closure is performed using the 2 previously deployed Perclose devices.  Protamine is administered once femoral arterial repair was complete. The site is clear with no evidence of bleeding or hematoma after the sutures are tightened. The temporary pacemaker, pigtail catheters and femoral sheaths were removed with manual pressure used for hemostasis. Fluoroscopy is used to remove the temporary wire in order to ensure the wire is not entangled in the patient's permanent pacing wires.   The patient tolerated the procedure well and is transported to the surgical intensive care in stable condition. There were no immediate intraoperative complications. All sponge instrument and needle counts are verified correct at completion of the operation.   The patient received a total of 51 mL of intravenous contrast during the procedure.  Sherren Mocha, MD 01/29/2017 10:09 AM

## 2017-01-29 NOTE — Progress Notes (Signed)
Pt arrived from OR. Sheath pulled from Left groin by [facility]. Pt denies pain and left groin has slight bruising at site. Gauze dressing applied by [facility]. Level 0 with 2+ left pedal pulse.

## 2017-01-29 NOTE — Anesthesia Preprocedure Evaluation (Addendum)
Anesthesia Evaluation   Patient awake    Reviewed: Allergy & Precautions, NPO status , Patient's Chart, lab work & pertinent test results  History of Anesthesia Complications Negative for: history of anesthetic complications  Airway Mallampati: II  TM Distance: >3 FB Neck ROM: Full    Dental  (+) Dental Advisory Given   Pulmonary neg shortness of breath, neg COPD, neg recent URI, former smoker,    breath sounds clear to auscultation       Cardiovascular hypertension, Pt. on medications and Pt. on home beta blockers (-) angina+ CAD  + dysrhythmias + pacemaker + Valvular Problems/Murmurs AS  Rhythm:Regular + Systolic murmurs    Neuro/Psych negative neurological ROS  negative psych ROS   GI/Hepatic Neg liver ROS, GERD  Controlled,  Endo/Other  Hypothyroidism   Renal/GU CRFRenal disease     Musculoskeletal  (+) Arthritis ,   Abdominal   Peds  Hematology  (+) anemia ,   Anesthesia Other Findings   Reproductive/Obstetrics                            Anesthesia Physical Anesthesia Plan  ASA: IV  Anesthesia Plan: MAC   Post-op Pain Management:    Induction:   PONV Risk Score and Plan: 2 and Treatment may vary due to age or medical condition and Ondansetron  Airway Management Planned: Nasal Cannula  Additional Equipment: Arterial line, CVP and Ultrasound Guidance Line Placement  Intra-op Plan:   Post-operative Plan:   Informed Consent: I have reviewed the patients History and Physical, chart, labs and discussed the procedure including the risks, benefits and alternatives for the proposed anesthesia with the patient or authorized representative who has indicated his/her understanding and acceptance.   Dental advisory given  Plan Discussed with: CRNA and Surgeon  Anesthesia Plan Comments:         Anesthesia Quick Evaluation

## 2017-01-29 NOTE — Interval H&P Note (Signed)
History and Physical Interval Note:  01/29/2017 7:15 AM  [name]  has presented today for surgery, with the diagnosis of severe aortic stenosis  The various methods of treatment have been discussed with the patient and family. After consideration of risks, benefits and other options for treatment, the patient has consented to  Procedure(s): TRANSCATHETER AORTIC VALVE REPLACEMENT, TRANSFEMORAL (N/A) TRANSESOPHAGEAL ECHOCARDIOGRAM (TEE) (N/A) as a surgical intervention .  The patient's history has been reviewed, patient examined, no change in status, stable for surgery.  I have reviewed the patient's chart and labs.  Questions were answered to the patient's satisfaction.     Rexene Alberts

## 2017-01-29 NOTE — Progress Notes (Signed)
TCTS BRIEF SICU PROGRESS NOTE  Day of Surgery  S/P Procedure(s) (LRB): TRANSCATHETER AORTIC VALVE REPLACEMENT, TRANSFEMORAL (N/A) TRANSESOPHAGEAL ECHOCARDIOGRAM (TEE) (N/A)   Feels better although some nausea Sitting up in chair AF w/ HR 65 BP stable off drips Breathing comfortably  Plan: Continue routine post-TAVR.  D/C Aline  Rexene Alberts, MD 01/29/2017 5:15 PM

## 2017-01-30 ENCOUNTER — Encounter (HOSPITAL_COMMUNITY): Payer: Self-pay | Admitting: Physician Assistant

## 2017-01-30 ENCOUNTER — Inpatient Hospital Stay (HOSPITAL_COMMUNITY): Payer: Medicare Other

## 2017-01-30 DIAGNOSIS — I340 Nonrheumatic mitral (valve) insufficiency: Secondary | ICD-10-CM

## 2017-01-30 DIAGNOSIS — D62 Acute posthemorrhagic anemia: Secondary | ICD-10-CM

## 2017-01-30 DIAGNOSIS — I300 Acute nonspecific idiopathic pericarditis: Secondary | ICD-10-CM

## 2017-01-30 DIAGNOSIS — I5033 Acute on chronic diastolic (congestive) heart failure: Secondary | ICD-10-CM

## 2017-01-30 LAB — BPAM RBC
BLOOD PRODUCT EXPIRATION DATE: 201812132359
ISSUE DATE / TIME: 201811271322
Unit Type and Rh: 6200

## 2017-01-30 LAB — TYPE AND SCREEN
ABO/RH(D): A POS
Antibody Screen: NEGATIVE
Unit division: 0

## 2017-01-30 LAB — ECHOCARDIOGRAM COMPLETE
AOASC: 34 cm
CHL CUP MV DEC (S): 232
EWDT: 232 ms
FS: 16 % — AB (ref 28–44)
Height: 66 in
IVS/LV PW RATIO, ED: 1.12
LA ID, A-P, ES: 34 mm
LA vol: 48.1 mL
LADIAMINDEX: 2.05 cm/m2
LAVOLA4C: 50.9 mL
LAVOLIN: 29 mL/m2
LEFT ATRIUM END SYS DIAM: 34 mm
LV PW d: 9.5 mm — AB (ref 0.6–1.1)
LVOT area: 2.84 cm2
LVOTD: 19 mm
MV Peak grad: 2 mmHg
MV pk A vel: 94.1 m/s
MV pk E vel: 73.2 m/s
P 1/2 time: 501 ms
RV sys press: 34 mmHg
Reg peak vel: 216 cm/s
TAPSE: 27.4 mm
TR max vel: 216 cm/s
WEIGHTICAEL: 2084.67 [oz_av]

## 2017-01-30 LAB — BASIC METABOLIC PANEL
Anion gap: 6 (ref 5–15)
BUN: 29 mg/dL — ABNORMAL HIGH (ref 6–20)
CALCIUM: 8.6 mg/dL — AB (ref 8.9–10.3)
CO2: 25 mmol/L (ref 22–32)
CREATININE: 1.18 mg/dL — AB (ref 0.44–1.00)
Chloride: 106 mmol/L (ref 101–111)
GFR calc non Af Amer: 41 mL/min — ABNORMAL LOW (ref >60)
GFR, EST AFRICAN AMERICAN: 47 mL/min — AB (ref >60)
Glucose, Bld: 115 mg/dL — ABNORMAL HIGH (ref 65–99)
Potassium: 4.9 mmol/L (ref 3.5–5.1)
SODIUM: 137 mmol/L (ref 135–145)

## 2017-01-30 LAB — CBC
HCT: 30 % — ABNORMAL LOW (ref 36.0–46.0)
Hemoglobin: 9.5 g/dL — ABNORMAL LOW (ref 12.0–15.0)
MCH: 27.2 pg (ref 26.0–34.0)
MCHC: 31.7 g/dL (ref 30.0–36.0)
MCV: 86 fL (ref 78.0–100.0)
PLATELETS: 154 10*3/uL (ref 150–400)
RBC: 3.49 MIL/uL — ABNORMAL LOW (ref 3.87–5.11)
RDW: 16.1 % — AB (ref 11.5–15.5)
WBC: 9.5 10*3/uL (ref 4.0–10.5)

## 2017-01-30 LAB — MAGNESIUM: MAGNESIUM: 1.8 mg/dL (ref 1.7–2.4)

## 2017-01-30 MED ORDER — IBUPROFEN 600 MG PO TABS
600.0000 mg | ORAL_TABLET | Freq: Three times a day (TID) | ORAL | Status: DC
  Administered 2017-01-30 – 2017-02-01 (×4): 600 mg via ORAL
  Filled 2017-01-30 (×3): qty 1
  Filled 2017-01-30: qty 3
  Filled 2017-01-30: qty 1

## 2017-01-30 MED ORDER — AMIODARONE HCL 200 MG PO TABS
200.0000 mg | ORAL_TABLET | Freq: Two times a day (BID) | ORAL | Status: DC
  Administered 2017-01-30 – 2017-01-31 (×2): 200 mg via ORAL
  Filled 2017-01-30 (×2): qty 1

## 2017-01-30 MED ORDER — IBUPROFEN 200 MG PO TABS
600.0000 mg | ORAL_TABLET | Freq: Once | ORAL | Status: AC
  Administered 2017-01-30: 600 mg via ORAL
  Filled 2017-01-30: qty 3

## 2017-01-30 MED FILL — Potassium Chloride Inj 2 mEq/ML: INTRAVENOUS | Qty: 20 | Status: AC

## 2017-01-30 MED FILL — Sodium Chloride IV Soln 0.9%: INTRAVENOUS | Qty: 250 | Status: AC

## 2017-01-30 MED FILL — Magnesium Sulfate Inj 50%: INTRAMUSCULAR | Qty: 10 | Status: AC

## 2017-01-30 MED FILL — Phenylephrine HCl Inj 10 MG/ML: INTRAMUSCULAR | Qty: 1 | Status: AC

## 2017-01-30 MED FILL — Heparin Sodium (Porcine) Inj 1000 Unit/ML: INTRAMUSCULAR | Qty: 30 | Status: AC

## 2017-01-30 NOTE — Progress Notes (Signed)
[place] VALVE TEAM  Patient Name: [name] [name] Date of Encounter: 01/30/2017  Primary Cardiologist: Dr. Martinique / Dr. [name] (EP) / Dr. Burt Knack & Dr. Roxy Manns (TAVR)  Hospital Problem List     Principal Problem:   S/P TAVR (transcatheter aortic valve replacement) Active Problems:   Essential hypertension   ATRIAL FIBRILLATION   Severe aortic stenosis   Long term current use of anticoagulant therapy   Pacemaker-[employer]   CAD in native artery   CKD (chronic kidney disease)   Anemia   Acute on chronic diastolic heart failure ([place])   Acute blood loss as cause of postoperative anemia    Subjective   Not feeling well. Muscle cramping has improved. Now having sharp chest pain with inspiration.   Inpatient Medications    Scheduled Meds: . acetaminophen (TYLENOL) oral liquid 160 mg/5 mL  650 mg Per Tube Once   Or  . acetaminophen  650 mg Rectal Once  . amiodarone  100 mg Oral Daily  . clopidogrel  75 mg Oral QPM  . levothyroxine  100 mcg Oral QAC breakfast  . mouth rinse  15 mL Mouth Rinse BID  . pantoprazole  40 mg Oral Daily  . rosuvastatin  10 mg Oral Daily   Continuous Infusions: . sodium chloride Stopped (01/29/17 1800)  . albumin human    . lactated ringers    . levofloxacin (LEVAQUIN) IV 750 mg (01/30/17 0903)  . nitroGLYCERIN    . phenylephrine (NEO-SYNEPHRINE) Adult infusion Stopped (01/29/17 1546)   PRN Meds: albumin human, diazepam, dicyclomine, fentaNYL (SUBLIMAZE) injection, lactated ringers, loratadine, metoprolol tartrate, ondansetron (ZOFRAN) IV, traMADol   Vital Signs    Vitals:   01/30/17 0600 01/30/17 0700 01/30/17 0745 01/30/17 0800  BP: 135/72 105/63  132/65  Pulse: 70 66  67  Resp: (!) 22 20  15   Temp:   99 F (37.2 C)   TempSrc:   Axillary   SpO2: 97% 99%  98%  Weight:      Height:        Intake/Output Summary (Last 24 hours) at 01/30/2017 0936 Last data filed at 01/30/2017 0903 Gross per  24 hour  Intake 2185 ml  Output 500 ml  Net 1685 ml   Filed Weights   01/29/17 0609 01/29/17 1015 01/30/17 0500  Weight: 128 lb (58.1 kg) 131 lb 6.3 oz (59.6 kg) 130 lb 4.7 oz (59.1 kg)    Physical Exam   GEN: Well nourished, well developed, in no acute distress. Siting up in chair HEENT: Grossly normal.  Neck: Supple, no JVD, carotid bruits, or masses. Cardiac: RRR, no murmurs, rubs, or gallops. No clubbing, cyanosis, edema.  Radials/DP/PT 2+ and equal bilaterally.  Respiratory:  Respirations regular and unlabored, clear to auscultation bilaterally. GI: Soft, nontender, nondistended, BS + x 4. MS: no deformity or atrophy. Skin: warm and dry, no rash. Groin sites are stable.  Neuro:  Strength and sensation are intact. Psych: AAOx3.  Normal affect.  Labs    CBC Recent Labs    01/29/17 0941  01/29/17 1730 01/30/17 0400  WBC 8.5  --   --  9.5  HGB 7.8*   < > 10.1* 9.5*  HCT 25.8*   < > 32.4* 30.0*  MCV 85.1  --   --  86.0  PLT 189  --   --  154   < > = values in this interval not displayed.   Basic Metabolic Panel Recent Labs  01/28/17 0859  01/29/17 0917 01/29/17 0952 01/30/17 0400  NA 138   < > 137 137 137  K 4.2   < > 4.4 4.3 4.9  CL 102   < > 104  --  106  CO2 28  --   --   --  25  GLUCOSE 87   < > 117* 108* 115*  BUN 27*   < > 26*  --  29*  CREATININE 1.37*   < > 1.10*  --  1.18*  CALCIUM 9.7  --   --   --  8.6*  MG  --   --   --   --  1.8   < > = values in this interval not displayed.   Liver Function Tests Recent Labs    01/28/17 0859  AST 26  ALT 15  ALKPHOS 65  BILITOT 0.9  PROT 6.5  ALBUMIN 4.0   No results for input(s): LIPASE, AMYLASE in the last 72 hours. Cardiac Enzymes No results for input(s): CKTOTAL, CKMB, CKMBINDEX, TROPONINI in the last 72 hours. BNP Invalid input(s): POCBNP D-Dimer No results for input(s): DDIMER in the last 72 hours. Hemoglobin A1C Recent Labs    01/28/17 0900  HGBA1C 5.5   Fasting Lipid Panel No  results for input(s): CHOL, HDL, LDLCALC, TRIG, CHOLHDL, LDLDIRECT in the last 72 hours. Thyroid Function Tests No results for input(s): TSH, T4TOTAL, T3FREE, THYROIDAB in the last 72 hours.  Invalid input(s): FREET3  Telemetry    intermittently paced with PVCs - Personally Reviewed  ECG    NSR HR 73, diffuse, subtle ST elevation - Personally Reviewed  Radiology    Dg Chest Port 1 View  Result Date: 01/29/2017 CLINICAL DATA:  Status post aortic valve replacement. EXAM: PORTABLE CHEST 1 VIEW COMPARISON:  Radiographs of January 28, 2017. FINDINGS: Stable cardiomegaly. Atherosclerosis of thoracic aorta is noted. Left-sided pacemaker is unchanged in position. No pneumothorax or pleural effusion is noted. Mild bibasilar subsegmental atelectasis or scarring is noted. Bony thorax is unremarkable. IMPRESSION: Aortic atherosclerosis. Mild bibasilar subsegmental atelectasis or scarring. Electronically Signed   By: Marijo Conception, M.D.   On: 01/29/2017 10:47    Cardiac Studies   TAVR OPERATIVE NOTE   Date of Procedure:                01/29/2017  Preoperative Diagnosis:      Severe Aortic Stenosis   Postoperative Diagnosis:    Same   Procedure:        Transcatheter Aortic Valve Replacement - Percutaneous Transfemoral Approach             Edwards Sapien 3 THV (size 26 mm, model # 9600TFX, serial # [id number])              Co-Surgeons:                        Valentina Gu. Roxy Manns, MD and Sherren Mocha, MD  Pre-operative Echo Findings: ? Severe aortic stenosis ? Normal left ventricular systolic function  Post-operative Echo Findings: ? No paravalvular leak ? Normal left ventricular systolic function  ____________________  Post operative echo pending 01/30/17  Patient Profile     [name] [name] is a 83 y.o. female with a history of PAF on Eliquis, tachy-brady s/p PPM, HTN, HLD, hypothyroidism, CKD, CAD s/p PCI/DES to LAD (12/20/16) and severe aortic stenosis who presented to  [facility] on 01/29/17 for planned TAVR.   Assessment & Plan  Severe AS: s/p successful TAVR with a 56mm Edwards Sapien THV on 01/29/17. Doing okay POD #1. Groin sites are stable. She has had several complaints including nausea, muscle cramping and now pleuritic chest pain. ECG does show new subtle, diffuse ST elevation concerning for possible pericarditis. Will try 600mg  ibuprofen to see if this helps pain. Will have post operative echo done earlier this morning to evaluate for pericardial effusion. Continue plavix only for now. Plan will be to resume low dose Eliquis at DC. Continue PPI for GI protection given plavix and now high dose NSAIDS. Will discontinue central line and transfer her to the floor.   Post operative anemia in the setting of chronic anemia: Hg dropped from 9.6--> 7.8. She was transfused with 1 U PRBCs yesterday. Hg stable today at 9.5, which is around her baseline.   Acute on chronic diastolic CHF: BNP was elevated over 500 on pre admission labs. This has been treated with TAVR. She appears euvolemic today.   CAD: s/p DES to LAD on 10/18. She was treated with triple therapy with low dose Elqiuis, ASA and plavix. She will continue on plavix and eliquis at discharge.   HTN: BP well controlled today. Home amlodipine 2.5mg  daily and lopressor 50mg  BID on hold   HLD: continue statin   PAF: continue amiodarone 100mg  daily. We will resume low dose Eliquis 2.5mg  BID at discharge (appropraite dosing given age over 79 and weight <60kg).   SSS s/p PPM: followed by Dr. [name]  CKD: creat has remained stable after TAVR.   SignedAngelena Form, PA-C  01/30/2017, 9:36 AM  Pager [phone]  Patient seen, examined. Available data reviewed. Agree with findings, assessment, and plan as outlined by Nell Range, PA-C.  On exam the patient is an alert, oriented woman in no distress.  Lung fields are clear.  Heart is regular rate and rhythm with a soft systolic ejection murmur at the  right upper sternal border.  There is no friction rub or diastolic murmur present.  Abdomen is soft and nontender.  Extremities have trace edema.  Lab and EKG data reviewed.  Echo from this morning is reviewed.  The patient has pleuritic chest discomfort and EKG with new conduction delay but demonstrating normal sinus rhythm.  There is mild diffuse ST elevation and it is possible the patient has pericarditis based on her symptoms and EKG changes.  I do not appreciate a significant pericardial effusion on her echo.  We will give her a short trial of nonsteroidal anti-inflammatory.  Will give a PPI.  She continues on clopidogrel.  She is maintaining sinus rhythm will plan to hold apixaban for another 24-48 hours.  Stable to transfer to telemetry today.  Sherren Mocha, M.D. 01/30/2017 1:12 PM

## 2017-01-30 NOTE — Progress Notes (Signed)
  Echocardiogram 2D Echocardiogram has been performed.  Randa Lynn Kanoelani Dobies 01/30/2017, 9:54 AM

## 2017-01-30 NOTE — Progress Notes (Signed)
CARDIAC REHAB PHASE I   PRE:  Rate/Rhythm: 81 SR  BP:  Supine: 102/50  Sitting:   Standing:    SaO2: 96% 1L  MODE:  Ambulation: 290 ft   POST:  Rate/Rhythm: 107 ST  BP:  Supine:   Sitting: 102/50  Standing:    SaO2: 92%RA 1410-1450 Pt incontinent of urine. Has purewick . Walked pt 290 ft on RA with gait belt use and rolling walker with fairly steady gait. Would recommend rolling walker for home and PT eval.  Case manager notified. Pt voided incontinently through pad and panty upon return from walk. Helped pt get cleaned and replaced pad and panty. Left off oxygen and notified RN. Did well on RA.    Graylon Good, RN BSN  01/30/2017 2:44 PM

## 2017-01-30 NOTE — Care Management Note (Signed)
Case Management Note  Patient Details  Name: [name] [name] MRN: [id number] Date of Birth: 1929/07/19  Subjective/Objective:  From home alone, pta indep, she still drives her self to [facility], PCP is Dr. Virgina Jock, she has a sister who lives in [place] who will be able to assist her. She does not use any assistive devices at home. If she needs HH services would like [facility].  Await pt eval. She would like to have a rolling walker thru [facility].  Referral given to [facility] for walker.                   Action/Plan: NCM will follow for dc needs.   Expected Discharge Date:                  Expected Discharge Plan:  [place]  In-House Referral:     Discharge planning Services  CM Consult  Post Acute Care Choice:    Choice offered to:     DME Arranged:    DME Agency:     HH Arranged:    [place] Agency:     Status of Service:  In process, will continue to follow  If discussed at Long Length of Stay Meetings, dates discussed:    Additional Comments:  Zenon Mayo, RN 01/30/2017, 2:25 PM

## 2017-01-31 ENCOUNTER — Other Ambulatory Visit: Payer: Self-pay

## 2017-01-31 DIAGNOSIS — I319 Disease of pericardium, unspecified: Secondary | ICD-10-CM

## 2017-01-31 DIAGNOSIS — I350 Nonrheumatic aortic (valve) stenosis: Secondary | ICD-10-CM

## 2017-01-31 DIAGNOSIS — Z952 Presence of prosthetic heart valve: Secondary | ICD-10-CM

## 2017-01-31 DIAGNOSIS — I309 Acute pericarditis, unspecified: Secondary | ICD-10-CM

## 2017-01-31 LAB — CBC
HEMATOCRIT: 28.6 % — AB (ref 36.0–46.0)
Hemoglobin: 8.9 g/dL — ABNORMAL LOW (ref 12.0–15.0)
MCH: 27.2 pg (ref 26.0–34.0)
MCHC: 31.1 g/dL (ref 30.0–36.0)
MCV: 87.5 fL (ref 78.0–100.0)
PLATELETS: 138 10*3/uL — AB (ref 150–400)
RBC: 3.27 MIL/uL — ABNORMAL LOW (ref 3.87–5.11)
RDW: 16.9 % — AB (ref 11.5–15.5)
WBC: 9.7 10*3/uL (ref 4.0–10.5)

## 2017-01-31 LAB — BASIC METABOLIC PANEL
Anion gap: 6 (ref 5–15)
BUN: 23 mg/dL — AB (ref 6–20)
CHLORIDE: 101 mmol/L (ref 101–111)
CO2: 26 mmol/L (ref 22–32)
CREATININE: 1.35 mg/dL — AB (ref 0.44–1.00)
Calcium: 8.2 mg/dL — ABNORMAL LOW (ref 8.9–10.3)
GFR calc Af Amer: 40 mL/min — ABNORMAL LOW (ref >60)
GFR calc non Af Amer: 35 mL/min — ABNORMAL LOW (ref >60)
GLUCOSE: 118 mg/dL — AB (ref 65–99)
POTASSIUM: 4.1 mmol/L (ref 3.5–5.1)
SODIUM: 133 mmol/L — AB (ref 135–145)

## 2017-01-31 MED ORDER — COLCHICINE 0.6 MG PO TABS
0.6000 mg | ORAL_TABLET | Freq: Every day | ORAL | Status: DC
  Administered 2017-01-31 – 2017-02-01 (×2): 0.6 mg via ORAL
  Filled 2017-01-31 (×2): qty 1

## 2017-01-31 MED ORDER — METOPROLOL TARTRATE 12.5 MG HALF TABLET
12.5000 mg | ORAL_TABLET | Freq: Two times a day (BID) | ORAL | Status: DC
  Administered 2017-01-31 – 2017-02-01 (×3): 12.5 mg via ORAL
  Filled 2017-01-31 (×3): qty 1

## 2017-01-31 MED ORDER — POLYETHYLENE GLYCOL 3350 17 G PO PACK
17.0000 g | PACK | Freq: Every day | ORAL | Status: DC
  Administered 2017-01-31: 17 g via ORAL
  Filled 2017-01-31 (×2): qty 1

## 2017-01-31 MED ORDER — AMIODARONE HCL 200 MG PO TABS
200.0000 mg | ORAL_TABLET | Freq: Every day | ORAL | Status: DC
  Administered 2017-02-01: 200 mg via ORAL
  Filled 2017-01-31: qty 1

## 2017-01-31 NOTE — Progress Notes (Signed)
Upon arrival to [facility] pt was found to be in Afib RVR. Order given to give 200 po amio. Pt converted to NSR with HR is 60's within an hour.

## 2017-01-31 NOTE — Progress Notes (Signed)
CARDIAC REHAB PHASE I   Pt just ambulated with PT, no complaints. Family at bedside, completed cardiac surgery discharge education. Reviewed IS, restrictions, anti-platelet therapy, activity progression, exercise guidelines, heart healthy diet, daily weights and phase 2 cardiac rehab. Pt and family verbalized understanding. Pt agrees to phase 2 cardiac rehab referral, will send to [facility]. Pt in recliner, call bell within reach. Will follow if pt does not discharge today.   [id number] Lenna Sciara, RN, BSN 01/31/2017 10:23 AM

## 2017-01-31 NOTE — Progress Notes (Addendum)
[facility] VALVE TEAM  Patient Name: [name] [name] Date of Encounter: 01/31/2017  Primary Cardiologist: Dr. Martinique / Dr. [name] (EP) / Dr. Burt Knack & Dr. Roxy Manns (TAVR)  Hospital Problem List     Principal Problem:   S/P TAVR (transcatheter aortic valve replacement) Active Problems:   Essential hypertension   ATRIAL FIBRILLATION   Severe aortic stenosis   Long term current use of anticoagulant therapy   Pacemaker-Medtronic   CAD in native artery   CKD (chronic kidney disease)   Anemia   Acute on chronic diastolic heart failure (HCC)   Acute blood loss as cause of postoperative anemia   Pericarditis    Subjective   Sitting up in chair. Appears much more comfortable today. Chest pain much better. No complaints, but not quite ready for discharge today.   Inpatient Medications    Scheduled Meds: . acetaminophen (TYLENOL) oral liquid 160 mg/5 mL  650 mg Per Tube Once   Or  . acetaminophen  650 mg Rectal Once  . [START ON 02/01/2017] amiodarone  200 mg Oral Daily  . clopidogrel  75 mg Oral QPM  . colchicine  0.6 mg Oral Daily  . ibuprofen  600 mg Oral TID  . levothyroxine  100 mcg Oral QAC breakfast  . pantoprazole  40 mg Oral Daily  . rosuvastatin  10 mg Oral Daily   Continuous Infusions: . sodium chloride Stopped (01/29/17 1800)   PRN Meds: diazepam, dicyclomine, fentaNYL (SUBLIMAZE) injection, loratadine, metoprolol tartrate, ondansetron (ZOFRAN) IV, traMADol   Vital Signs    Vitals:   01/30/17 2046 01/31/17 0003 01/31/17 0609 01/31/17 0750  BP: (!) 99/51 (!) 90/52 118/61 (!) 134/59  Pulse: (!) 144 96 65 66  Resp: 18 (!) 27 19 (!) 23  Temp: 98.2 F (36.8 C) 98.6 F (37 C) (!) 97.5 F (36.4 C) 98 F (36.7 C)  TempSrc: Axillary Axillary Axillary Oral  SpO2: 91% 95% 95% 97%  Weight:   133 lb 6.1 oz (60.5 kg)   Height:        Intake/Output Summary (Last 24 hours) at 01/31/2017 1049 Last data filed at 01/31/2017  0800 Gross per 24 hour  Intake 480 ml  Output 510 ml  Net -30 ml   Filed Weights   01/29/17 1015 01/30/17 0500 01/31/17 0609  Weight: 131 lb 6.3 oz (59.6 kg) 130 lb 4.7 oz (59.1 kg) 133 lb 6.1 oz (60.5 kg)    Physical Exam   GEN: Well nourished, well developed, in no acute distress. Siting up in chair HEENT: Grossly normal.  Neck: Supple, no JVD, carotid bruits, or masses. Cardiac: RRR, soft systolic ejection murmur, rubs, or gallops. No clubbing, cyanosis, edema.  Radials/DP/PT 2+ and equal bilaterally.  Respiratory:  Respirations regular and unlabored, clear to auscultation bilaterally. GI: Soft, nontender, nondistended, BS + x 4. MS: no deformity or atrophy. Skin: warm and dry, no rash. Groin sites are stable.  Neuro:  Strength and sensation are intact. Psych: AAOx3.  Normal affect.  Labs    CBC Recent Labs    01/30/17 0400 01/31/17 0818  WBC 9.5 9.7  HGB 9.5* 8.9*  HCT 30.0* 28.6*  MCV 86.0 87.5  PLT 154 308*   Basic Metabolic Panel Recent Labs    01/30/17 0400 01/31/17 0818  NA 137 133*  K 4.9 4.1  CL 106 101  CO2 25 26  GLUCOSE 115* 118*  BUN 29* 23*  CREATININE 1.18* 1.35*  CALCIUM 8.6*  8.2*  MG 1.8  --    Liver Function Tests No results for input(s): AST, ALT, ALKPHOS, BILITOT, PROT, ALBUMIN in the last 72 hours. No results for input(s): LIPASE, AMYLASE in the last 72 hours. Cardiac Enzymes No results for input(s): CKTOTAL, CKMB, CKMBINDEX, TROPONINI in the last 72 hours. BNP Invalid input(s): POCBNP D-Dimer No results for input(s): DDIMER in the last 72 hours. Hemoglobin A1C No results for input(s): HGBA1C in the last 72 hours. Fasting Lipid Panel No results for input(s): CHOL, HDL, LDLCALC, TRIG, CHOLHDL, LDLDIRECT in the last 72 hours. Thyroid Function Tests No results for input(s): TSH, T4TOTAL, T3FREE, THYROIDAB in the last 72 hours.  Invalid input(s): FREET3  Telemetry    Now sinus, converted from afib last night - Personally  Reviewed  ECG    01/31/17: NSR HR 69, diffuse, subtle ST elevation - Personally Reviewed  Radiology    No results found.  Cardiac Studies   TAVR OPERATIVE NOTE   Date of Procedure:                01/29/2017  Preoperative Diagnosis:      Severe Aortic Stenosis   Postoperative Diagnosis:    Same   Procedure:        Transcatheter Aortic Valve Replacement - Percutaneous Transfemoral Approach             Edwards Sapien 3 THV (size 26 mm, model # 9600TFX, serial # [id number])              Co-Surgeons:                        Valentina Gu. Roxy Manns, MD and Sherren Mocha, MD  Pre-operative Echo Findings: ? Severe aortic stenosis ? Normal left ventricular systolic function  Post-operative Echo Findings: ? No paravalvular leak ? Normal left ventricular systolic function  ____________________  Post operative echo 01/30/17 Study Conclusions - Left ventricle: The cavity size was normal. Wall thickness was   increased in a pattern of mild LVH. Systolic function was normal.   The estimated ejection fraction was in the range of 60% to 65%. - Aortic valve: Post TAVR with 23 mm Sapien 3 valve trivial peri   prosthetic regurgitation. - Mitral valve: There was mild regurgitation. - Atrial septum: No defect or patent foramen ovale was identified. - Pulmonary arteries: PA peak pressure: 34 mm Hg (S).   Patient Profile     [name] [name] is a 83 y.o. female with a history of PAF on Eliquis, tachy-brady s/p PPM, HTN, HLD, hypothyroidism, CKD, CAD s/p PCI/DES to LAD (12/20/16) and severe aortic stenosis who presented to [facility] on 01/29/17 for planned TAVR.   Assessment & Plan    Severe AS: s/p successful TAVR with a 55mm Edwards Sapien THV on 01/29/17. Doing much better POD #2. Groin sites are stable. Post operative echo shows good valve placement with trivial PVL. No gradients reported. Continue plavix. Will resume home Eliquis 2.5mg  BID at discharge.   Post op pericarditis: patient  had pleuritic chest pain and ECG with mild diffuse ST elevation consistent with pericarditis. Post operative echo with no pericardial effusion. Chest pain has greatly improved with ibuprofen 600mg  TID. Will try to minimize NSAID therapy in the setting of plavix and eliquis. Continue NSAIDS while admitted. Will add colchicine 0.6mg  daily (she is <70 kg). Continue PPI for GI protection.  Post operative anemia in the setting of chronic anemia: Hg dropped from 9.6--> 7.8 post  op. She was transfused with 1 U PRBCs 11/27. Hg 8.9 today. Continue to monitor.   Acute on chronic diastolic CHF: BNP was elevated over 500 on pre admission labs. This has been treated with TAVR. She appears euvolemic today.   CAD: s/p DES to LAD on 10/18. She was treated with triple therapy with low dose Elqiuis, ASA and plavix. She will continue on plavix and eliquis at discharge.   PAF: she had a brief run of aifb last night that converted after amio 200 mg x1. Continue amiodarone 200mg  daily (increased from home dose of 100mg  daily). We will resume low dose Eliquis 2.5mg  BID at discharge. She is on the border of low dose and full dose Eliquis. (weight fluctuates ~ 60kg, >85 yo and creat 1.35.) Given that she requires plavix with recent PCI and TAVR, we feel lower dose is more appropriate.  HTN: BP well controlled today. Home amlodipine 2.5mg  daily and lopressor 50mg  BID on hold. I will resume Lopressor at a lower dose today given run of afib yesterday and improved hypotension.   HLD: continue statin   SSS s/p PPM: followed by Dr. [name]  CKD: creat has remained stable after TAVR.   Dispo: hopefully home tomorrow. PT has recommended discharge with [facility] PT and a rolling walker.   SignedAngelena Form, PA-C  01/31/2017, 10:49 AM  Pager [phone]  Patient seen, examined. Available data reviewed. Agree with findings, assessment, and plan as outlined by Nell Range, PA-C.  Exam reveals an elderly woman in no  distress.  Lung fields are clear.  Heart is regular rate and rhythm with a grade 2/6 systolic ejection murmur at the right upper sternal border with no diastolic murmur.  Abdomen is soft and nontender.  Extremities have mild edema in the legs.  Echo demonstrates normal function of her transcatheter heart valve.  Gradients in the operating room were normal post valve deployment.  There are no gradients reported on the postoperative day #1 study.  She looks much better today but her EKG is fairly typical of acute pericarditis with diffuse ST segment elevation even more pronounced than yesterday.  She has clinically responded well to ibuprofen and colchicine has been added with hopes that we can minimize her ibuprofen use in the setting of anticoagulation with apixaban and antiplatelet therapy with clopidogrel.  We will follow-up tomorrow morning and hopefully she will be ready for discharge.  Discussed her case with Dr Roxy Manns, and we agree that we should avoid systemic anticoagulation for a few weeks to lower risk of hemorrhagic pericarditis. Will plan to DC on ASA 81 mg and plavix 75 mg daily. Will arrange repeat limited echo next week as outpatient to make sure she doesn't develop an effusion.  Sherren Mocha, M.D. 01/31/2017 6:11 PM

## 2017-01-31 NOTE — Evaluation (Signed)
Physical Therapy Evaluation Patient Details Name: [name] [name] MRN: [id number] DOB: [birth year] Today's Date: 01/31/2017   History of Present Illness  Pt is an 83 y/o female s/p TAVR on 11/27. Following TAVR pt went into a fib with RVR, however, has since returned to sinus rhythm. PMH inlcudes HTN, a fib, CKD, CAD s/p stent placement, dCHF, and SSS s/p pacemaker placement.   Clinical Impression  Pt s/p surgery above with deficits below. PTA, pt was independent with functional mobility. Upon eval, pt presenting with decreased strength, fatigue, and unsteadiness. Required min guard assist for mobility with RW this session. Educated about use of RW at home to increase safety with mobility. Reporting her sister and sister in law will be staying with her at d/c. Recommending HHPT at d/c to increase independence and safety with mobility. Will continue to follow acutely to ensure safety with mobility prior to return home.     Follow Up Recommendations Home health PT;Supervision for mobility/OOB    Equipment Recommendations  Rolling walker with 5" wheels    Recommendations for Other Services OT consult     Precautions / Restrictions Precautions Precautions: Fall Restrictions Weight Bearing Restrictions: No      Mobility  Bed Mobility               General bed mobility comments: IN chair upon entry.   Transfers Overall transfer level: Needs assistance Equipment used: Rolling walker (2 wheeled) Transfers: Sit to/from Stand Sit to Stand: Min guard         General transfer comment: Min guard for safety. Verbal cues for safe hand placement.   Ambulation/Gait Ambulation/Gait assistance: Min guard Ambulation Distance (Feet): 200 Feet Assistive device: Rolling walker (2 wheeled) Gait Pattern/deviations: Step-through pattern;Decreased stride length Gait velocity: Decreased  Gait velocity interpretation: Below normal speed for age/gender General Gait Details: Slow, cautious  gait. Slight unsteadiness, however, no LOB noted. Pt with 2/4 DOE, however, oxygen sats >90% on RA. Pt required cues for appropriate proximity to device.   Stairs            Wheelchair Mobility    Modified Rankin (Stroke Patients Only)       Balance Overall balance assessment: Needs assistance Sitting-balance support: No upper extremity supported;Feet supported Sitting balance-Leahy Scale: Good     Standing balance support: Bilateral upper extremity supported;During functional activity Standing balance-Leahy Scale: Poor Standing balance comment: REliant on RW for stability                              Pertinent Vitals/Pain Pain Assessment: No/denies pain    Home Living Family/patient expects to be discharged to:: Private residence Living Arrangements: Alone Available Help at Discharge: Family;Available 24 hours/day Type of Home: Other(Comment)(townhouse ) Home Access: Stairs to enter Entrance Stairs-Rails: Right;Left Entrance Stairs-Number of Steps: 4 Home Layout: Two level;Able to live on main level with bedroom/bathroom Home Equipment: Shower seat - built in Additional Comments: Sister and Sister in law will be staying with her.     Prior Function Level of Independence: Independent               Hand Dominance   Dominant Hand: Right    Extremity/Trunk Assessment   Upper Extremity Assessment Upper Extremity Assessment: Defer to OT evaluation    Lower Extremity Assessment Lower Extremity Assessment: Generalized weakness    Cervical / Trunk Assessment Cervical / Trunk Assessment: Kyphotic  Communication   Communication: No difficulties  Cognition Arousal/Alertness: Awake/alert Behavior During Therapy: WFL for tasks assessed/performed Overall Cognitive Status: Within Functional Limits for tasks assessed                                        General Comments General comments (skin integrity, edema, etc.): Educated  about activity pacing and energy conservation at home. Educated about HHPT and RW recommendations for home as well. Educated about staying on the first floor at home as well to increase safety.     Exercises     Assessment/Plan    PT Assessment Patient needs continued PT services  PT Problem List Decreased strength;Decreased balance;Decreased mobility;Decreased knowledge of use of DME       PT Treatment Interventions DME instruction;Gait training;Stair training;Functional mobility training;Therapeutic activities;Therapeutic exercise;Balance training;Neuromuscular re-education;Patient/family education    PT Goals (Current goals can be found in the Care Plan section)  Acute Rehab PT Goals Patient Stated Goal: to go home  PT Goal Formulation: With patient Time For Goal Achievement: 02/14/17 Potential to Achieve Goals: Good    Frequency Min 3X/week   Barriers to discharge Decreased caregiver support Lives alone, however, reports family will be staying with her at d/c     Co-evaluation               AM-PAC PT "6 Clicks" Daily Activity  Outcome Measure Difficulty turning over in bed (including adjusting bedclothes, sheets and blankets)?: A Little Difficulty moving from lying on back to sitting on the side of the bed? : Unable Difficulty sitting down on and standing up from a chair with arms (e.g., wheelchair, bedside commode, etc,.)?: Unable Help needed moving to and from a bed to chair (including a wheelchair)?: A Little Help needed walking in hospital room?: A Little Help needed climbing 3-5 steps with a railing? : A Little 6 Click Score: 14    End of Session Equipment Utilized During Treatment: Gait belt Activity Tolerance: Patient tolerated treatment well Patient left: in chair;with call bell/phone within reach Nurse Communication: Mobility status PT Visit Diagnosis: Unsteadiness on feet (R26.81);Muscle weakness (generalized) (M62.81)    Time: 7026-3785 PT Time  Calculation (min) (ACUTE ONLY): 19 min   Charges:   PT Evaluation $PT Eval Low Complexity: 1 Low     PT G Codes:        Leighton Ruff, PT, DPT  Acute Rehabilitation Services  Pager: [phone]   Rudean Hitt 01/31/2017, 10:12 AM

## 2017-02-01 ENCOUNTER — Other Ambulatory Visit: Payer: Self-pay

## 2017-02-01 ENCOUNTER — Encounter (HOSPITAL_COMMUNITY): Payer: Self-pay | Admitting: Cardiovascular Disease

## 2017-02-01 DIAGNOSIS — Z952 Presence of prosthetic heart valve: Secondary | ICD-10-CM

## 2017-02-01 DIAGNOSIS — I350 Nonrheumatic aortic (valve) stenosis: Secondary | ICD-10-CM

## 2017-02-01 LAB — CBC
HCT: 27.8 % — ABNORMAL LOW (ref 36.0–46.0)
HEMOGLOBIN: 8.7 g/dL — AB (ref 12.0–15.0)
MCH: 27.1 pg (ref 26.0–34.0)
MCHC: 31.3 g/dL (ref 30.0–36.0)
MCV: 86.6 fL (ref 78.0–100.0)
PLATELETS: 129 10*3/uL — AB (ref 150–400)
RBC: 3.21 MIL/uL — ABNORMAL LOW (ref 3.87–5.11)
RDW: 16.9 % — ABNORMAL HIGH (ref 11.5–15.5)
WBC: 7.8 10*3/uL (ref 4.0–10.5)

## 2017-02-01 LAB — BASIC METABOLIC PANEL
Anion gap: 4 — ABNORMAL LOW (ref 5–15)
BUN: 21 mg/dL — ABNORMAL HIGH (ref 6–20)
CALCIUM: 8 mg/dL — AB (ref 8.9–10.3)
CO2: 27 mmol/L (ref 22–32)
CREATININE: 1.36 mg/dL — AB (ref 0.44–1.00)
Chloride: 104 mmol/L (ref 101–111)
GFR calc Af Amer: 40 mL/min — ABNORMAL LOW (ref >60)
GFR calc non Af Amer: 34 mL/min — ABNORMAL LOW (ref >60)
Glucose, Bld: 108 mg/dL — ABNORMAL HIGH (ref 65–99)
Potassium: 4.3 mmol/L (ref 3.5–5.1)
Sodium: 135 mmol/L (ref 135–145)

## 2017-02-01 MED ORDER — AMIODARONE HCL 200 MG PO TABS: 200.0000 mg | ORAL_TABLET | Freq: Two times a day (BID) | ORAL | 3 refills | Status: DC

## 2017-02-01 MED ORDER — COLCHICINE 0.6 MG PO TABS: 0.6000 mg | ORAL_TABLET | Freq: Every day | ORAL | 0 refills | Status: DC

## 2017-02-01 MED ORDER — AMIODARONE HCL 200 MG PO TABS: 200.0000 mg | ORAL_TABLET | Freq: Two times a day (BID) | ORAL | Status: DC

## 2017-02-01 MED ORDER — PANTOPRAZOLE SODIUM 40 MG PO TBEC: 40.0000 mg | DELAYED_RELEASE_TABLET | Freq: Every day | ORAL | 1 refills | Status: DC

## 2017-02-01 MED ORDER — METOPROLOL TARTRATE 25 MG PO TABS: 12.5000 mg | ORAL_TABLET | Freq: Two times a day (BID) | ORAL | 6 refills | Status: DC

## 2017-02-01 MED ORDER — IBUPROFEN 600 MG PO TABS: 600.0000 mg | ORAL_TABLET | ORAL | 0 refills | Status: DC

## 2017-02-01 NOTE — Progress Notes (Signed)
CARDIAC REHAB PHASE I   PRE:  Rate/Rhythm: 65 paced  BP:  Sitting: 108/73         SaO2: 97 RA  MODE:  Ambulation: 320 ft   POST:  Rate/Rhythm: 106 paced  BP:  Sitting: 121/59         SaO2: 96 RA  Pt ambulated 320 ft on RA, hand held assist, steady gait, tolerated well with no complaints. Answered pt's questions regarding education. Pt to recliner after walk, call bell within reach.   [phone] Lenna Sciara, RN, BSN 02/01/2017 10:01 AM

## 2017-02-01 NOTE — Anesthesia Postprocedure Evaluation (Signed)
Anesthesia Post Note  Patient: Mindel P Kassebaum  Procedure(s) Performed: TRANSCATHETER AORTIC VALVE REPLACEMENT, TRANSFEMORAL (N/A Chest) TRANSESOPHAGEAL ECHOCARDIOGRAM (TEE) (N/A )     Patient location during evaluation: ICU Anesthesia Type: MAC Level of consciousness: awake and alert Pain management: pain level controlled Vital Signs Assessment: post-procedure vital signs reviewed and stable Respiratory status: spontaneous breathing, nonlabored ventilation, respiratory function stable and patient connected to nasal cannula oxygen Cardiovascular status: stable Postop Assessment: no apparent nausea or vomiting Anesthetic complications: no    Last Vitals:  Vitals:   02/01/17 0815 02/01/17 0945  BP: 107/71 (!) 121/59  Pulse: 61 86  Resp: 15 14  Temp: 36.6 C   SpO2: 96% 95%    Last Pain:  Vitals:   02/01/17 0815  TempSrc: Axillary  PainSc:                  Andrik Sandt

## 2017-02-01 NOTE — Progress Notes (Signed)
Physical Therapy Treatment Patient Details Name: [name] [name] MRN: [id number] DOB: 1929/10/05 Today's Date: 02/01/2017    History of Present Illness Pt is an 83 y/o female s/p TAVR on 11/27. Following TAVR pt went into a fib with RVR, however, has since returned to sinus rhythm. PMH inlcudes HTN, a fib, CKD, CAD s/p stent placement, dCHF, and SSS s/p pacemaker placement.     PT Comments    Pt progressing towards goals and increased tolerance for mobility this session. Reports she is feeling much better as well. Practiced gait and stair training with pt to ensure safety at home and pt requiring min guard to supervision. Current recommendations appropriate. Will continue to follow acutely to maximize functional mobility independence and safety.    Follow Up Recommendations  Home health PT;Supervision for mobility/OOB     Equipment Recommendations  Rolling walker with 5" wheels    Recommendations for Other Services OT consult     Precautions / Restrictions Precautions Precautions: Fall Restrictions Weight Bearing Restrictions: No    Mobility  Bed Mobility               General bed mobility comments: IN chair upon entry.   Transfers Overall transfer level: Needs assistance Equipment used: Rolling walker (2 wheeled) Transfers: Sit to/from Stand Sit to Stand: Supervision         General transfer comment: Supervision for safety.   Ambulation/Gait Ambulation/Gait assistance: Supervision;Min guard Ambulation Distance (Feet): 250 Feet Assistive device: Rolling walker (2 wheeled) Gait Pattern/deviations: Step-through pattern;Decreased stride length Gait velocity: Decreased  Gait velocity interpretation: Below normal speed for age/gender General Gait Details: Improved technique and gait tolerance this session. No SOB noted during ambulation and pt reports feeling much better. Overall steady with use of RW.    Stairs Stairs: Yes   Stair Management: One rail  Right;Sideways;Step to pattern Number of Stairs: 6 General stair comments: Cautious, safe stair navigation. Educated about using BUE and sideways technique to improve safety at home. Educated about assist required at home.   Wheelchair Mobility    Modified Rankin (Stroke Patients Only)       Balance Overall balance assessment: Needs assistance Sitting-balance support: No upper extremity supported;Feet supported Sitting balance-Leahy Scale: Good     Standing balance support: Bilateral upper extremity supported;No upper extremity supported;During functional activity Standing balance-Leahy Scale: Fair Standing balance comment: Able to maintain static standing without UE support to wash hands                             Cognition Arousal/Alertness: Awake/alert Behavior During Therapy: WFL for tasks assessed/performed Overall Cognitive Status: Within Functional Limits for tasks assessed                                        Exercises      General Comments General comments (skin integrity, edema, etc.): Pt reports she will be staying downstairs initially and asking when it will be ok for her to go to second level in her home. Educated about role of HHPT and allowing them to practice steps with her to improve sagety.       Pertinent Vitals/Pain Pain Assessment: No/denies pain    Home Living                      Prior Function  PT Goals (current goals can now be found in the care plan section) Acute Rehab PT Goals Patient Stated Goal: to go home  PT Goal Formulation: With patient Time For Goal Achievement: 02/14/17 Potential to Achieve Goals: Good Progress towards PT goals: Progressing toward goals    Frequency    Min 3X/week      PT Plan Current plan remains appropriate    Co-evaluation              AM-PAC PT "6 Clicks" Daily Activity  Outcome Measure  Difficulty turning over in bed (including adjusting  bedclothes, sheets and blankets)?: A Little Difficulty moving from lying on back to sitting on the side of the bed? : A Little Difficulty sitting down on and standing up from a chair with arms (e.g., wheelchair, bedside commode, etc,.)?: A Little Help needed moving to and from a bed to chair (including a wheelchair)?: A Little Help needed walking in hospital room?: A Little Help needed climbing 3-5 steps with a railing? : A Little 6 Click Score: 18    End of Session Equipment Utilized During Treatment: Gait belt Activity Tolerance: Patient tolerated treatment well Patient left: in chair;with call bell/phone within reach Nurse Communication: Mobility status PT Visit Diagnosis: Unsteadiness on feet (R26.81);Muscle weakness (generalized) (M62.81)     Time: 3833-3832 PT Time Calculation (min) (ACUTE ONLY): 19 min  Charges:  $Gait Training: 8-22 mins                    G Codes:       [name] [name], PT, DPT  Acute Rehabilitation Services  Pager: [phone]    [name] [name] 02/01/2017, 11:29 AM

## 2017-02-01 NOTE — Discharge Instructions (Signed)
YOUR  NEXIUM WAS SWITCHED TO PANTOPRAZOLE IN ORDER TO AVOID A POTENTIAL INTERACTION WITH PLAVIX, MAKING IT LESS EFFECTIVE. THEY ARE BOTH SIMILAR MEDICATIONS TO TREAT ACID REFLUX  ACTIVITY AND EXERCISE  Daily activity and exercise are an important part of your recovery. People recover at different rates depending on their general health and type of valve procedure.  Most people recovering from TAVR feel better relatively quickly   No lifting, pushing, pulling more than 10 pounds (examples to avoid: groceries, vacuuming, gardening, golfing):             - For one week with a procedure through the groin.             - For six weeks for procedures through the chest wall.             - For three months for procedures through the breast-bone. NOTE: You will typically see one of our providers 7-10 days after your procedure to discuss [place] the above activities.    DRIVING  Do not drive for until you are seen for follow up and cleared by a provider.  If you have been told by your doctor in the past that you may not drive, you must talk with him/her before you begin driving again.   DRESSING  Groin site: you may leave the clear dressing over the site for up to one week or until it falls off.   HYGIENE  If you had a femoral (leg) procedure, you may take a shower when you return home. After the shower, pat the site dry. Do NOT use powder, oils or lotions in your groin area until the site has completely healed.  If you had a chest procedure, you may shower when you return home unless specifically instructed not to by your discharging practitioner.             - DO NOT scrub incision; pat dry with a towel             - DO NOT apply any lotions, oils, powders to the incision             - No tub baths / swimming for at least 2 weeks.  If you notice any fevers, chills, increased pain, swelling, bleeding or pus, please contact your doctor.  ADDITIONAL INFORMATION  If you are  going to have an upcoming dental procedure, please contact our office as you will require antibiotics ahead of time to prevent infection on your heart valve.

## 2017-02-01 NOTE — Discharge Summary (Signed)
[facility] TEAM   Discharge Summary    Patient ID: [name] [name],  MRN: [id number], DOB/AGE: 08/05/1929 83 y.o.  Admit date: 01/29/2017 Discharge date: 02/01/2017  Primary Care Provider: [name] Primary Cardiologist: Dr. Martinique / Dr. [name] (EP) / Dr. Burt Knack & Dr. Roxy Manns (TAVR)    Discharge Diagnoses    Principal Problem:   S/P TAVR (transcatheter aortic valve replacement) Active Problems:   Essential hypertension   ATRIAL FIBRILLATION   Severe aortic stenosis   Long term current use of anticoagulant therapy   Pacemaker-Medtronic   CAD in native artery   CKD (chronic kidney disease)   Anemia   Acute on chronic diastolic heart failure (HCC)   Acute blood loss as cause of postoperative anemia   Pericarditis   Allergies Allergies  Allergen Reactions  . Penicillins Anaphylaxis and Other (See Comments)    Has patient had a PCN reaction causing immediate rash, facial/tongue/throat swelling, SOB or lightheadedness with hypotension: Yes Has patient had a PCN reaction causing severe rash involving mucus membranes or skin necrosis: No Has patient had a PCN reaction that required hospitalization: No Has patient had a PCN reaction occurring within the last 10 years: No If all of the above answers are "NO", then may proceed with Cephalosporin use.   . Tetanus Toxoid Anaphylaxis  . Demerol Other (See Comments)    Severe nausea  . Codeine Nausea Only and Other (See Comments)    Severe nausea     History of Present Illness    [name] [name] is a 83 y.o. female with a history of PAF on Eliquis, tachy-brady s/p PPM, HTN, HLD, hypothyroidism, CKD, CAD s/p PCI/DES to LAD (12/20/16) and severe aortic stenosis who presented to [facility] on 01/29/17 for planned TAVR.  Previous echocardiograms had demonstrated moderate aortic stenosis with normal left ventricular systolic function.  An echo in April 2018 showed a mean gradient of 30 mmHg.  The dimensionless index was 0.19.  Over the past 4-6 months the patient has had progressive exertional shortness of breath, fatigue, and chest discomfort.  She was seen in the atrial fibrillation clinic and noted to be in atrial fibrillation.  She was started on amiodarone and converted to sinus rhythm.  She was also found to have failure of her right atrial lead and underwent explant and placement of a new dual-chamber pacemaker on 10/09/2016. She continued to have symptoms and underwent cardiac catheterization on 11/27/2016. This showed severe aortic stenosis with a mean transvalvular gradient of 40 mmHg and an aortic valve area of 0.61 cm. Cardiac catheterization also showed an 85% stenosis of the mid LAD with otherwise nonobstructive disease.  This was treated with a drug-eluting stent on 12/20/2016.  She was switched to Eliquis in addition to aspirin and Plavix.  She said that she has felt better since the PCI with an improvement in her symptoms.   She was evaluated by the multidisiplinary valve team and ultimately felt to be a good candidate for TAVR, which was set up for 01/29/17.  Hospital Course     Consultants: none  Severe AS: s/p successful TAVR with a 58mm Edwards Sapien THV on 01/29/17. Doing much better POD #3. Groin sites are stable. Post operative echo shows good valve placement with trivial PVL. No gradients reported. Will hold off on resuming home Eliquis at discharge given pericarditis to avoid hemorraghic conversion. Will discharge on aspirin 81mg  daily and plavix 75mg  daily.   Post op  pericarditis: patient had pleuritic chest pain and ECG with mild diffuse ST elevation consistent with pericarditis. Post operative echo with no pericardial effusion. She clinically improved on Colchicine 0.6mg  daily and Ibuprofen 600mg  TID. Continue PPI for GI protection. Will hold off on resuming home Eliquis at discharge given pericarditis to avoid hemorraghic conversion. I will arrange for a limited  echo prior to our appointment next Wednesday to make sure she doesn't develop an effusion. Given need for ASA and plavix, will stop scheduled Ibuprofen ( she can take judiciously PRN for pain.)  Post operative anemia in the setting of chronic anemia: Hg dropped from 9.6--> 7.8 post op. She was transfused with 1 U PRBCs 11/27. Hg 8.7 today. Will follow as an outpatient.    Acute on chronic diastolic CHF: BNP was elevated over 500 on pre admission labs. This has been treated with TAVR. She appears euvolemic today. Continue home PRN lasix at discharge.   CAD: s/p DES to LAD on 10/18. She was treated with triple therapy with low dose Elqiuis, ASA and plavix. She will continue on plavix and asa at discharge.   PAF: she has had a few runs of atrial fibrillation, including this morning, but now back in sinus. Home amiodarone 100mg  increased to 200mg  BID, which I will continue at discharge temporarily. We will resume low dose Eliquis 2.5mg  BID in a few weeks given acute pericarditis to avoid hemorraghic conversion.   Of note, she is on the border of low dose and full dose Eliquis. (weight fluctuates ~ 60kg, >57 yo and creat 1.35.) Given that she requires plavix with recent PCI and TAVR, we feel lower dose is more appropriate.  HTN: BPs have been soft and home amlodipine 2.5 mg daily has been on hold. She has been resumed on her home Lopressor but at a lower dose (12.5mg  BID). She will continue on this at DC.   HLD: continue statin   SSS s/p PPM: followed by Dr. [name]  CKD: creat has remained stable after TAVR.   Dispo: home with rolling walker. I will order home health PT on Wednesday at her outpatient appointment   The patient has had an uncomplicated hospital course and is recovering well. The femoral catheter sites are stable. She has been seen by Dr. Burt Knack today and deemed ready for discharge home. All follow-up appointments have been scheduled. Discharge medications are listed  below.  _____________  Discharge Vitals Blood pressure 107/71, pulse 61, temperature 97.9 F (36.6 C), temperature source Axillary, resp. rate 15, height 5\' 6"  (1.676 m), weight 132 lb 9.6 oz (60.1 kg), SpO2 96 %.  Filed Weights   01/30/17 0500 01/31/17 0609 02/01/17 0400  Weight: 130 lb 4.7 oz (59.1 kg) 133 lb 6.1 oz (60.5 kg) 132 lb 9.6 oz (60.1 kg)    GEN: Well nourished, well developed, in no acute distress. Siting up in chair HEENT: Grossly normal.  Neck: Supple, no JVD, carotid bruits, or masses. Cardiac: RRR, soft systolic ejection murmur, rubs, or gallops. No clubbing, cyanosis, edema.  Radials/DP/PT 2+ and equal bilaterally.  Respiratory:  Respirations regular and unlabored, clear to auscultation bilaterally. GI: Soft, nontender, nondistended, BS + x 4. MS: no deformity or atrophy. Skin: warm and dry, no rash. Groin sites are stable.  Neuro:  Strength and sensation are intact. Psych: AAOx3.  Normal affect.    Labs & Radiologic Studies     CBC Recent Labs    01/31/17 0818 02/01/17 0336  WBC 9.7 7.8  HGB 8.9*  8.7*  HCT 28.6* 27.8*  MCV 87.5 86.6  PLT 138* 595*   Basic Metabolic Panel Recent Labs    01/30/17 0400 01/31/17 0818 02/01/17 0336  NA 137 133* 135  K 4.9 4.1 4.3  CL 106 101 104  CO2 25 26 27   GLUCOSE 115* 118* 108*  BUN 29* 23* 21*  CREATININE 1.18* 1.35* 1.36*  CALCIUM 8.6* 8.2* 8.0*  MG 1.8  --   --    Liver Function Tests No results for input(s): AST, ALT, ALKPHOS, BILITOT, PROT, ALBUMIN in the last 72 hours. No results for input(s): LIPASE, AMYLASE in the last 72 hours. Cardiac Enzymes No results for input(s): CKTOTAL, CKMB, CKMBINDEX, TROPONINI in the last 72 hours. BNP Invalid input(s): POCBNP D-Dimer No results for input(s): DDIMER in the last 72 hours. Hemoglobin A1C No results for input(s): HGBA1C in the last 72 hours. Fasting Lipid Panel No results for input(s): CHOL, HDL, LDLCALC, TRIG, CHOLHDL, LDLDIRECT in the last 72  hours. Thyroid Function Tests No results for input(s): TSH, T4TOTAL, T3FREE, THYROIDAB in the last 72 hours.  Invalid input(s): FREET3  Dg Chest 2 View  Result Date: 01/28/2017 CLINICAL DATA:  83 year old hypertensive female pre aortic valve replacement exam. No chest complaints. Initial encounter. EXAM: CHEST  2 VIEW COMPARISON:  12/31/2016 CT.  10/10/2016 chest x-ray. FINDINGS: Left sequential pacemaker in place with leads in the region of the right atrium right ventricle. Abandoned right sided pacemaker leads. Cardiomegaly. Coronary artery calcifications. Calcified mildly tortuous aorta. Chronic lung changes. No infiltrate, congestive heart failure or pneumothorax. Lingular scarring stable. Remote T12 superior endplate compression fracture.  Scoliosis. IMPRESSION: Chronic lung changes.  No infiltrate or congestive heart failure. Pacemaker leads in place as noted above. Aortic Atherosclerosis (ICD10-I70.0). Electronically Signed   By: Genia Del M.D.   On: 01/28/2017 10:12   Dg Chest Port 1 View  Result Date: 01/29/2017 CLINICAL DATA:  Status post aortic valve replacement. EXAM: PORTABLE CHEST 1 VIEW COMPARISON:  Radiographs of January 28, 2017. FINDINGS: Stable cardiomegaly. Atherosclerosis of thoracic aorta is noted. Left-sided pacemaker is unchanged in position. No pneumothorax or pleural effusion is noted. Mild bibasilar subsegmental atelectasis or scarring is noted. Bony thorax is unremarkable. IMPRESSION: Aortic atherosclerosis. Mild bibasilar subsegmental atelectasis or scarring. Electronically Signed   By: Marijo Conception, M.D.   On: 01/29/2017 10:47     Diagnostic Studies/Procedures    TAVR OPERATIVE NOTE   Date of Procedure:01/29/2017  Preoperative Diagnosis:Severe Aortic Stenosis   Postoperative Diagnosis:Same   Procedure:   Transcatheter Aortic Valve Replacement - Percutaneous Transfemoral Approach Edwards Sapien 3  THV (size 80mm, model # 9600TFX, serial # [id number])  Co-Surgeons:Clarence H. Roxy Manns, MD and Sherren Mocha, MD  Pre-operative Echo Findings: ? Severe aortic stenosis ? Normalleft ventricular systolic function  Post-operative Echo Findings: ? Noparavalvular leak ? Normalleft ventricular systolic function  ____________________  Post operative echo 01/30/17 Study Conclusions - Left ventricle: The cavity size was normal. Wall thickness was increased in a pattern of mild LVH. Systolic function was normal. The estimated ejection fraction was in the range of 60% to 65%. - Aortic valve: Post TAVR with 23 mm Sapien 3 valve trivial peri prosthetic regurgitation. - Mitral valve: There was mild regurgitation. - Atrial septum: No defect or patent foramen ovale was identified. - Pulmonary arteries: PA peak pressure: 34 mm Hg (S).   Disposition   Pt is being discharged home today in good condition.  Follow-up Plans & Appointments    Follow-up Information  [facility] Follow up.   Why:  Best boy information: [address] [place] [address] [phone]        [facility] Follow up.   Specialty:  Home Health Services Why:  Physical Therapy Contact information: [place] [id number] [phone]        Eileen Stanford, PA-C. Go on 02/06/2017.   Specialties:  Cardiology, Radiology Why:  @ 11:30am for an ultrasound of your heart followed by an appointment with Nell Range PA-C Contact information: [place] [id number] [phone]          Discharge Instructions    Amb Referral to Cardiac Rehabilitation   Complete by:  As directed    Diagnosis:  Valve Replacement   Valve:  Aortic Comment - TAVR   Diet - low sodium heart healthy   Complete by:  As directed    Increase activity slowly   Complete by:  As  directed       Discharge Medications     Medication List    STOP taking these medications   amLODipine 2.5 MG tablet Commonly known as:  NORVASC   apixaban 2.5 MG Tabs tablet Commonly known as:  ELIQUIS   esomeprazole 40 MG capsule Commonly known as:  [place] these medications   acetaminophen 500 MG tablet Commonly known as:  TYLENOL Take 1,000 mg by mouth every 4 (four) hours as needed for moderate pain or headache.   amiodarone 200 MG tablet Commonly known as:  PACERONE Take 1 tablet (200 mg total) by mouth 2 (two) times daily. Until seen back in clinic What changed:    how much to take  how to take this  when to take this  additional instructions   aspirin EC 81 MG tablet Take 81 mg by mouth daily.   calcium carbonate 600 MG Tabs tablet Commonly known as:  OS-CAL Take 600 mg by mouth every evening.   clopidogrel 75 MG tablet Commonly known as:  PLAVIX Take 1 tablet (75 mg total) by mouth daily. What changed:  when to take this   colchicine 0.6 MG tablet Take 1 tablet (0.6 mg total) by mouth daily. Start taking on:  02/02/2017   dicyclomine 10 MG capsule Commonly known as:  BENTYL Take 10 mg by mouth 2 (two) times daily as needed for spasms.   furosemide 20 MG tablet Commonly known as:  LASIX Take 1 tablet (20 mg total) by mouth daily as needed (weight gain). What changed:  reasons to take this   ibuprofen 600 MG tablet Commonly known as:  ADVIL,MOTRIN Take 1 tablet (600 mg total) by mouth as directed. Only take for moderate to severe pain. Try to avoid as it increases risk of bleeding with other medications   levothyroxine 100 MCG tablet Commonly known as:  SYNTHROID, LEVOTHROID Take 100 mcg by mouth daily before breakfast.   loratadine 10 MG tablet Commonly known as:  CLARITIN Take 10 mg by mouth daily as needed for allergies.   metoprolol tartrate 25 MG tablet Commonly known as:  LOPRESSOR Take 0.5 tablets (12.5 mg total) by  mouth 2 (two) times daily. What changed:    medication strength  how much to take   nitroGLYCERIN 0.4 MG SL tablet Commonly known as:  NITROSTAT Place 1 tablet (0.4 mg total) under the tongue every 5 (five) minutes as needed for chest pain.  pantoprazole 40 MG tablet Commonly known as:  PROTONIX Take 1 tablet (40 mg total) by mouth daily. Start taking on:  02/02/2017   polyvinyl alcohol 1.4 % ophthalmic solution Commonly known as:  LIQUIFILM TEARS Place 1-2 drops into both eyes 3 (three) times daily as needed for dry eyes.   PRESERVISION AREDS 2 PO Take 1 capsule 2 (two) times daily by mouth.   rosuvastatin 10 MG tablet Commonly known as:  CRESTOR Take 1 tablet (10 mg total) by mouth daily.   sodium chloride 0.65 % Soln nasal spray Commonly known as:  OCEAN Place 1 spray into both nostrils as needed for congestion.   Vitamin D 1000 units capsule Take 1,000 Units by mouth daily.         Outstanding Labs/Studies   BMET, CBC  Duration of Discharge Encounter   Greater than 30 minutes including physician time.  Signed, Angelena Form PA-C 02/01/2017, 10:22 AM  Patient seen, examined. Available data reviewed. Agree with findings, assessment, and plan as outlined by Nell Range, PA-C. On exam the patient is alert, oriented, in NAD. Lungs CTA, CV RRR with 2/6 SEM at the RUSB, extremities without edema. Pt clinically improved, stable for discharge. Plans as previously outlined to discharge on ASA and plavix, resume eliquis in approximately 2 weeks. Limited echo next week at follow-up. All questions answered.   Sherren Mocha, M.D. 02/02/2017 9:27 PM

## 2017-02-04 ENCOUNTER — Telehealth: Payer: Self-pay | Admitting: Physician Assistant

## 2017-02-04 ENCOUNTER — Telehealth (HOSPITAL_COMMUNITY): Payer: Self-pay

## 2017-02-04 NOTE — Telephone Encounter (Signed)
  [facility] VALVE TEAM   Patient contacted regarding discharge from [facility] on 02/02/16  Patient understands to follow up with provider Nell Range on 12/5 at at 11:30 for a limited echo and follow up.  Patient understands discharge instructions? yes Patient understands medications and regiment? yes Patient understands to bring all medications to this visit? yes   Having orthopnea and PND and nausea. PLAN take 40mg  lasix now. Decrease Amio from 200mg  BID to 100mg  daily. Will call back tomorrow to check on her.   Angelena Form PA-C  [facility]

## 2017-02-04 NOTE — Telephone Encounter (Signed)
Patient's insurance is active and benefits verified through [facility] - $20.00 co-pay, no deductible, out of pocket amount of $4,000/$2,753.55 has been met, no co-insurance, and no pre-authorization is required. Passport/reference [phone]  Patient will be contacted and scheduled after their follow up appt with the Cardiologist office upon review by the RN Navigator.

## 2017-02-04 NOTE — Progress Notes (Signed)
[facility]                                       Cardiology Office Note    Date:  02/06/2017   ID:  [name] [name], DOB 1929/05/12, MRN [id number]  PCP:  [name], MD  Cardiologist: Dr. Martinique / Dr. [name] (EP) / Dr. Burt Knack & Dr. Roxy Manns (TAVR)  CC: TOC follow up s/p TAVR  History of Present Illness:  [name] [name] is a 83 y.o. female with a history of PAF on Eliquis, tachy-brady s/p PPM, HTN, HLD, hypothyroidism, CKD, CAD s/p PCI/DES to LAD (12/20/16) and severe aortic stenosis s/p TAVR (01/29/17) who presents to clinic for follow up.   Previous echocardiograms had demonstrated moderate aortic stenosis with normal left ventricular systolic function. An echo in April 2018 showed a mean gradient of 30 mmHg. The dimensionless index was 0.19. Over the past 4-6 months the patient has had progressive exertional shortness of breath, fatigue, and chest discomfort. She was seen in the atrial fibrillation clinic and noted to be in atrial fibrillation. She was started on amiodarone and converted to sinus rhythm. She was also found to have failure of her right atrial lead and underwent explant and placement of a new dual-chamber pacemaker on 10/09/2016. She continued to have symptoms and underwent cardiac catheterization on 11/27/2016. This showed severe aortic stenosis with a mean transvalvular gradient of 40 mmHg and anaortic valve area of 0.61 cm. Cardiac catheterization also showed an 85% stenosis of the mid LAD with otherwise nonobstructive disease. This was treated with a drug-eluting stent on 12/20/2016. She was switched to Eliquis in addition to aspirin and Plavix. She said that she has felt better since the PCI with an improvement in her symptoms.   She underwent successful TAVR with a 25mm Edwards Sapien THV via the TF approach on 01/29/17. Post operative echo showed good valve placement with trivial PVL. Post operative course was  complicated by acute pericarditis, afib with RVR and post operative anemia. She was treated with ibuprofen/colchicine, amiodarone loading and 1 U PRBCs. She was discharged on ASA and plavix. Home Eliquis 2.5mg  BID was held at discharge given acute pericarditis and concern for hemorraghic conversion. Given intermittent afib post operatively, she was discharged on amio 200mg  BID. Limited echo scheduled for 02/06/17.  She called our office on Monday 12/3 to report LE edema, orthopnea/PND and nausea. I asked her to take lasix 40mg  x1 and decrease amio to previous home dosing of amio 100 mg daily.  Today she presents to clinic for follow up. She is feeling much better. No more chest pain at all. She has not had to take any ibuprofen and was never able to fill the colchicine. Her breathing is much improved. She still has some trace LE edema but no orthopnea or PND. No dizziness or syncope. No blood in stool or urine. No palpitations. She has been walking and doing well with that. Nausea has also resolved.     Past Medical History:  Diagnosis Date  . Aortic stenosis, severe   . CKD (chronic kidney disease)   . Complication of anesthesia   . Coronary artery disease    a. 12/20/2016: s/p DES to LAD   . Diverticulosis   . GERD (gastroesophageal reflux disease)   . Hemorrhoids   . Hyperlipidemia   . Hypertension   .  Hypothyroidism   . Osteoarthritis   . Paroxysmal atrial fibrillation (HCC)    a. on [place] with Xarelto but switched to Eliquis after stenting.   . Presence of permanent cardiac pacemaker   . S/P TAVR (transcatheter aortic valve replacement) 01/29/2017   23 mm Edwards Sapien 3 transcatheter heart valve placed via percutaneous right transfemoral approach   . Sick sinus syndrome ([place])    a. s/p PPM (MDT)    Past Surgical History:  Procedure Laterality Date  . ABDOMINAL HYSTERECTOMY  1980  . BASAL CELL CARCINOMA EXCISION    . CARDIAC CATHETERIZATION  ~ 11/2016  . CATARACT EXTRACTION W/  INTRAOCULAR LENS  IMPLANT, BILATERAL Bilateral   . CORONARY ANGIOPLASTY WITH STENT PLACEMENT  12/20/2016   "1 stent"  . CORONARY STENT INTERVENTION N/A 12/20/2016   Procedure: CORONARY STENT INTERVENTION;  Surgeon: Martinique, Peter M, MD;  Location: [facility];  Service: Cardiovascular;  Laterality: N/A;  . FINGER SURGERY Left    "fell; developed skiers thumb; had to operate on it"  . INSERT / REPLACE / [place]   for syptomatic bradycardia and syncope -- in [facility]  . LEAD REVISION/REPAIR N/A 10/09/2016   New left subclavian MDT Adapta L PPM dual chamber system implanted by Dr [name] with previously placed R subclavian system abandoned  . PACEMAKER GENERATOR CHANGE  2001   pulse generator replacement by Dr. Rollene Fare   . PACEMAKER GENERATOR CHANGE  04/01/2008   PPM Medtronic -- model # [id number] serial # [id number] -- pulse generator replacement by Dr. Verlon Setting   . RIGHT/LEFT HEART CATH AND CORONARY ANGIOGRAPHY N/A 11/27/2016   Procedure: RIGHT/LEFT HEART CATH AND CORONARY ANGIOGRAPHY;  Surgeon: Martinique, Peter M, MD;  Location: [facility];  Service: Cardiovascular;  Laterality: N/A;  . SQUAMOUS CELL CARCINOMA EXCISION    . TEE WITHOUT CARDIOVERSION N/A 01/29/2017   Procedure: TRANSESOPHAGEAL ECHOCARDIOGRAM (TEE);  Surgeon: Sherren Mocha, MD;  Location: [facility];  Service: Open Heart Surgery;  Laterality: N/A;  . TONSILLECTOMY    . TRANSCATHETER AORTIC VALVE REPLACEMENT, TRANSFEMORAL N/A 01/29/2017   Procedure: TRANSCATHETER AORTIC VALVE REPLACEMENT, TRANSFEMORAL;  Surgeon: Sherren Mocha, MD;  Location: [facility];  Service: Open Heart Surgery;  Laterality: N/A;    Current Medications: Outpatient Medications Prior to Visit  Medication Sig Dispense Refill  . acetaminophen (TYLENOL) 500 MG tablet Take 1,000 mg by mouth every 4 (four) hours as needed for moderate pain or headache.     . calcium carbonate (OS-CAL) 600 MG TABS Take 600 mg by mouth every evening.     .  Cholecalciferol (VITAMIN D) 1000 UNITS capsule Take 1,000 Units by mouth daily.      . clopidogrel (PLAVIX) 75 MG tablet Take 1 tablet (75 mg total) by mouth daily. (Patient taking differently: Take 75 mg by mouth every evening. ) 90 tablet 3  . dicyclomine (BENTYL) 10 MG capsule Take 10 mg by mouth 2 (two) times daily as needed for spasms.   1  . levothyroxine (SYNTHROID, LEVOTHROID) 100 MCG tablet Take 100 mcg by mouth daily before breakfast.     . loratadine (CLARITIN) 10 MG tablet Take 10 mg by mouth daily as needed for allergies.    . metoprolol tartrate (LOPRESSOR) 25 MG tablet Take 0.5 tablets (12.5 mg total) by mouth 2 (two) times daily. 30 tablet 6  . nitroGLYCERIN (NITROSTAT) 0.4 MG SL tablet Place 1 tablet (0.4 mg total) under the tongue every 5 (five) minutes as needed  for chest pain. 25 tablet 11  . pantoprazole (PROTONIX) 40 MG tablet Take 1 tablet (40 mg total) by mouth daily. 90 tablet 1  . polyvinyl alcohol (LIQUIFILM TEARS) 1.4 % ophthalmic solution Place 1-2 drops into both eyes 3 (three) times daily as needed for dry eyes.    Marland Kitchen amiodarone (PACERONE) 200 MG tablet Take 100 mg by mouth 2 (two) times daily.    Marland Kitchen aspirin EC 81 MG tablet Take 81 mg by mouth daily.    . colchicine 0.6 MG tablet Take 1 tablet (0.6 mg total) by mouth daily. 7 tablet 0  . furosemide (LASIX) 20 MG tablet Take 1 tablet (20 mg total) by mouth daily as needed (weight gain). (Patient taking differently: Take 20 mg by mouth daily as needed (for fluid retention/weight gain.). ) 30 tablet 6  . ibuprofen (ADVIL,MOTRIN) 600 MG tablet Take 1 tablet (600 mg total) by mouth as directed. Only take for moderate to severe pain. Try to avoid as it increases risk of bleeding with other medications 30 tablet 0  . rosuvastatin (CRESTOR) 10 MG tablet Take 1 tablet (10 mg total) by mouth daily. 90 tablet 3  . sodium chloride (OCEAN) 0.65 % SOLN nasal spray Place 1 spray into both nostrils as needed for congestion.    Marland Kitchen  amiodarone (PACERONE) 200 MG tablet Take 1 tablet (200 mg total) by mouth 2 (two) times daily. Until seen back in clinic (Patient not taking: Reported on 02/06/2017) 90 tablet 3  . Multiple Vitamins-Minerals (PRESERVISION AREDS 2 PO) Take 1 capsule 2 (two) times daily by mouth.     No facility-administered medications prior to visit.      Allergies:   Penicillins; Tetanus toxoid; Demerol; and Codeine   Social History   Socioeconomic History  . Marital status: Widowed    Spouse name: None  . Number of children: 0  . Years of education: None  . Highest education level: None  Social Needs  . Financial resource strain: None  . Food insecurity - worry: None  . Food insecurity - inability: None  . Transportation needs - medical: None  . Transportation needs - non-medical: None  Occupational History    Employer: RETIRED  Tobacco Use  . Smoking status: Former Smoker    Packs/day: 1.50    Years: 30.00    Pack years: 45.00    Types: Cigarettes    Last attempt to quit: 03/05/1978    Years since quitting: 38.9  . Smokeless tobacco: Never Used  Substance and Sexual Activity  . Alcohol use: No  . Drug use: No  . Sexual activity: None  Other Topics Concern  . None  Social History Narrative  . None     Family History:  The patient's family history includes Alzheimer's disease in her mother; Cancer in her father.      ROS:   Please see the history of present illness.    ROS All other systems reviewed and are negative.   PHYSICAL EXAM:   VS:  BP 130/78   Pulse 81   Ht 5\' 6"  (1.676 m)   Wt 128 lb (58.1 kg)   SpO2 96%   BMI 20.66 kg/m    GEN: Well nourished, well developed, in no acute distress  HEENT: normal  Neck: no JVD, carotid bruits, or masses Cardiac: RRR; soft murmur @ RUSB. no rubs, or gallops, trace bilateral LE edema  Respiratory:  clear to auscultation bilaterally, normal work of breathing GI: soft, nontender, nondistended, +  BS MS: no deformity or atrophy    Skin: warm and dry, no rash Neuro:  Alert and Oriented x 3, Strength and sensation are intact Psych: euthymic mood, full affec    Wt Readings from Last 3 Encounters:  02/06/17 128 lb (58.1 kg)  02/01/17 132 lb 9.6 oz (60.1 kg)  01/28/17 128 lb (58.1 kg)      Studies/Labs Reviewed:   EKG:  EKG is ordered today.  The ekg ordered today demonstrates atrial paced rhythm   Recent Labs: 01/28/2017: ALT 15; B Natriuretic Peptide 522.0 01/30/2017: Magnesium 1.8 02/01/2017: BUN 21; Creatinine, Ser 1.36; Hemoglobin 8.7; Platelets 129; Potassium 4.3; Sodium 135   Lipid Panel No results found for: CHOL, TRIG, HDL, CHOLHDL, VLDL, LDLCALC, LDLDIRECT  Additional studies/ records that were reviewed today include:  TAVR OPERATIVE NOTE   Date of Procedure:01/29/2017  Preoperative Diagnosis:Severe Aortic Stenosis   Postoperative Diagnosis:Same   Procedure:   Transcatheter Aortic Valve Replacement - Percutaneous Transfemoral Approach Edwards Sapien 3 THV (size 67mm, model # 9600TFX, serial # [id number])  Co-Surgeons:Clarence H. Roxy Manns, MD and Sherren Mocha, MD  Pre-operative Echo Findings: ? Severe aortic stenosis ? Normalleft ventricular systolic function  Post-operative Echo Findings: ? Noparavalvular leak ? Normalleft ventricular systolic function  ____________________  Post operative echo 01/30/17 Study Conclusions - Left ventricle: The cavity size was normal. Wall thickness was increased in a pattern of mild LVH. Systolic function was normal. The estimated ejection fraction was in the range of 60% to 65%. - Aortic valve: Post TAVR with 23 mm Sapien 3 valve trivial peri prosthetic regurgitation. - Mitral valve: There was mild regurgitation. - Atrial septum: No defect or patent foramen ovale was identified. - Pulmonary arteries: PA peak pressure: 34 mm Hg  (S).   _____________________  Limited echo 02/06/17 Study Conclusions - Left ventricle: The cavity size was normal. Wall thickness was   normal. Systolic function was normal. The estimated ejection   fraction was in the range of 60% to 65%. Wall motion was normal;   there were no regional wall motion abnormalities. Doppler   parameters are consistent with abnormal left ventricular   relaxation (grade 1 diastolic dysfunction). - Aortic valve: Bioprosthetic aortic valve s/p TAVR. Trivial aortic   insufficiency. No significant bioprosthetic valve stenosis. Mean   gradient (S): 12 mm Hg. - Mitral valve: At least moderate eccentric mitral regurgitation,   probably not fully visualized. - Left atrium: The atrium was moderately dilated. - Right ventricle: The cavity size was mildly dilated. Pacer wire   or catheter noted in right ventricle. Systolic function was   normal. - Right atrium: The atrium was moderately dilated. - Tricuspid valve: There was mild-moderate regurgitation. Peak   RV-RA gradient (S): 27 mm Hg. - Pulmonary arteries: PA peak pressure: 42 mm Hg (S). - Systemic veins: IVC measured 2.3 cm with < 50% respirophasic   variation, suggesting RA pressure 15 mmHg. Impressions: - Normal LV size with EF 60-65%. Mildly dilated RV with normal   systolic function. Bioprosthetic aortic valve s/p TAVR. No   significant bioprosthetic valvular stenosis, trivial   peri-valvular regurgitation. At least moderate eccentric mitral   regurgitation, probably not fully visualized. Moderate biatrial   enlargement. Mild pulmonary hypertension.    ASSESSMENT & PLAN:   Severe AS s/p TAVR: s/p successful TAVR with a 78mm Edwards Sapien THV via the TF approach on 01/29/17. Groin sites stable.  ECG with atrial paced rhythm. Currently on ASA and plavix as Eliquis was held given pericarditis  and concern for hemorraghic conversion. Echo today shows no pericardial effusion, her chest pain has  resolved and no more diffuse ST elevation on ECG. I will stop ASA and restart home Eliquis 2.5mg  BID. Continue plaivx 75mg  daily.   Post op pericarditis: limited echo today shows no pericardial effusion. She is now chest pain free. Stop PRN ibuprofen and colchicine.   Post operative anemia in the setting of chronic anemia: Hg dropped from 9.6--> 7.8 post op. She was transfused with 1 U PRBCs 11/27. Hg 8.7at discharge. Check CBC today.   Acute on chronic diastolic [id number] with lasix as an outpatient. She appears euvolemic. Weight is 128 lbs. This is her new dry weight. She still has some ankle swelling. I will switch her lasix from 20mg  PRN to daily. BMET today  CAD: s/p DES to LAD on 10/18. She was treated with triple therapy with low dose Elqiuis, ASA and plavix. She will now continue on Eliquis 2.5mg  BID and plavix 75mg  daily.   PAF: she had intermittent afib after her TAVR. This resolved with amio loading. She had some nausea and amiodarone was lowered back to 100mg  daily. ECG shows atrial paced rhythm today.  Will resume Eliquis 2.5mg  BID today.   Of note, she is on the border of low dose and full dose Eliquis. (weight fluctuates ~ 60kg, >89 yo and creat 1.35.) Given that she requires plavix with recent PCI and TAVR, we feel lower dose is more appropriate.  HTN: BP well controlled today. Continue current medications.   HLD: continue statin   SSS s/p PPM: followed by Dr. [name]  CKD: creat 1.36 at discharge. Will check a BMET today   Medication Adjustments/Labs and Tests Ordered: Current medicines are reviewed at length with the patient today.  Concerns regarding medicines are outlined above.  Medication changes, Labs and Tests ordered today are listed in the Patient Instructions below. Patient Instructions  Medication Instructions:  Your physician has recommended you make the following change in your medication:  Change amiodarone to 100 mg by mouth daily. Stop  Aspirin. Stop colchicine.  Resume Eliquis 2.5 mg by mouth twice daily. Change furosemide to 20 mg by mouth daily.  Stop Ibuprofen  Labwork: Lab work to be done today--BMP, CBC  Testing/Procedures: Echocardiogram as planned.  Follow-Up: Follow up with K. Grandville Silos, [place] on 12/20 as planned.   Any Other Special Instructions Will Be Listed Below (If Applicable).  Take Clindamycin 600 mg by mouth one hour prior to any dental appointments and bladder or stomach procedures.    Referral made to Physical Therapy.   If you need a refill on your cardiac medications before your next appointment, please call your pharmacy.      Signed, Angelena Form, PA-C  02/06/2017 5:41 PM    [facility] [address], [place], [place]  [address] Phone: [phone]; Fax: [phone]

## 2017-02-06 ENCOUNTER — Ambulatory Visit: Payer: Medicare Other | Admitting: Physician Assistant

## 2017-02-06 ENCOUNTER — Ambulatory Visit (HOSPITAL_COMMUNITY): Payer: Medicare Other | Attending: Cardiology

## 2017-02-06 ENCOUNTER — Other Ambulatory Visit: Payer: Self-pay

## 2017-02-06 ENCOUNTER — Encounter: Payer: Self-pay | Admitting: Physician Assistant

## 2017-02-06 VITALS — BP 130/78 | HR 81 | Ht 66.0 in | Wt 128.0 lb

## 2017-02-06 DIAGNOSIS — N189 Chronic kidney disease, unspecified: Secondary | ICD-10-CM

## 2017-02-06 DIAGNOSIS — Z952 Presence of prosthetic heart valve: Secondary | ICD-10-CM

## 2017-02-06 DIAGNOSIS — D649 Anemia, unspecified: Secondary | ICD-10-CM

## 2017-02-06 DIAGNOSIS — I10 Essential (primary) hypertension: Secondary | ICD-10-CM | POA: Insufficient documentation

## 2017-02-06 DIAGNOSIS — I480 Paroxysmal atrial fibrillation: Secondary | ICD-10-CM

## 2017-02-06 DIAGNOSIS — E785 Hyperlipidemia, unspecified: Secondary | ICD-10-CM | POA: Diagnosis not present

## 2017-02-06 DIAGNOSIS — I319 Disease of pericardium, unspecified: Secondary | ICD-10-CM | POA: Diagnosis not present

## 2017-02-06 DIAGNOSIS — Z950 Presence of cardiac pacemaker: Secondary | ICD-10-CM

## 2017-02-06 DIAGNOSIS — I4891 Unspecified atrial fibrillation: Secondary | ICD-10-CM | POA: Insufficient documentation

## 2017-02-06 DIAGNOSIS — I272 Pulmonary hypertension, unspecified: Secondary | ICD-10-CM | POA: Insufficient documentation

## 2017-02-06 DIAGNOSIS — I5033 Acute on chronic diastolic (congestive) heart failure: Secondary | ICD-10-CM | POA: Diagnosis not present

## 2017-02-06 DIAGNOSIS — I340 Nonrheumatic mitral (valve) insufficiency: Secondary | ICD-10-CM | POA: Insufficient documentation

## 2017-02-06 DIAGNOSIS — R0600 Dyspnea, unspecified: Secondary | ICD-10-CM | POA: Diagnosis not present

## 2017-02-06 DIAGNOSIS — I350 Nonrheumatic aortic (valve) stenosis: Secondary | ICD-10-CM | POA: Diagnosis not present

## 2017-02-06 DIAGNOSIS — I2510 Atherosclerotic heart disease of native coronary artery without angina pectoris: Secondary | ICD-10-CM

## 2017-02-06 MED ORDER — APIXABAN 2.5 MG PO TABS: 2.5000 mg | ORAL_TABLET | Freq: Two times a day (BID) | ORAL | 2 refills | Status: DC

## 2017-02-06 MED ORDER — FUROSEMIDE 20 MG PO TABS: 20.0000 mg | ORAL_TABLET | Freq: Every day | ORAL | 3 refills | Status: DC

## 2017-02-06 MED ORDER — AMIODARONE HCL 200 MG PO TABS: 100.0000 mg | ORAL_TABLET | Freq: Every day | ORAL | 3 refills | Status: DC

## 2017-02-06 MED ORDER — CLINDAMYCIN HCL 300 MG PO CAPS: ORAL_CAPSULE | ORAL | 4 refills | Status: DC

## 2017-02-06 NOTE — Patient Instructions (Addendum)
Medication Instructions:  Your physician has recommended you make the following change in your medication:  Change amiodarone to 100 mg by mouth daily. Stop Aspirin. Stop colchicine.  Resume Eliquis 2.5 mg by mouth twice daily. Change furosemide to 20 mg by mouth daily.  Stop Ibuprofen  Labwork: Lab work to be done today--BMP, CBC  Testing/Procedures: Echocardiogram as planned.  Follow-Up: Follow up with K. Grandville Silos, [place] on 12/20 as planned.   Any Other Special Instructions Will Be Listed Below (If Applicable).  Take Clindamycin 600 mg by mouth one hour prior to any dental appointments and bladder or stomach procedures.    Referral made to Physical Therapy.   If you need a refill on your cardiac medications before your next appointment, please call your pharmacy.

## 2017-02-07 ENCOUNTER — Telehealth (HOSPITAL_COMMUNITY): Payer: Self-pay

## 2017-02-07 LAB — CBC WITH DIFFERENTIAL/PLATELET
BASOS ABS: 0 10*3/uL (ref 0.0–0.2)
BASOS: 0 %
EOS (ABSOLUTE): 0.2 10*3/uL (ref 0.0–0.4)
Eos: 3 %
HEMOGLOBIN: 10.5 g/dL — AB (ref 11.1–15.9)
Hematocrit: 32.9 % — ABNORMAL LOW (ref 34.0–46.6)
IMMATURE GRANS (ABS): 0 10*3/uL (ref 0.0–0.1)
Immature Granulocytes: 0 %
LYMPHS ABS: 1 10*3/uL (ref 0.7–3.1)
LYMPHS: 14 %
MCH: 27.1 pg (ref 26.6–33.0)
MCHC: 31.9 g/dL (ref 31.5–35.7)
MCV: 85 fL (ref 79–97)
Monocytes Absolute: 0.6 10*3/uL (ref 0.1–0.9)
Monocytes: 9 %
NEUTROS ABS: 5.1 10*3/uL (ref 1.4–7.0)
Neutrophils: 74 %
PLATELETS: 236 10*3/uL (ref 150–379)
RBC: 3.87 x10E6/uL (ref 3.77–5.28)
RDW: 18 % — ABNORMAL HIGH (ref 12.3–15.4)
WBC: 6.9 10*3/uL (ref 3.4–10.8)

## 2017-02-07 LAB — BASIC METABOLIC PANEL
BUN / CREAT RATIO: 12 (ref 12–28)
BUN: 14 mg/dL (ref 8–27)
CALCIUM: 8.1 mg/dL — AB (ref 8.7–10.3)
CHLORIDE: 104 mmol/L (ref 96–106)
CO2: 24 mmol/L (ref 20–29)
Creatinine, Ser: 1.19 mg/dL — ABNORMAL HIGH (ref 0.57–1.00)
GFR, EST AFRICAN AMERICAN: 48 mL/min/{1.73_m2} — AB (ref >59)
GFR, EST NON AFRICAN AMERICAN: 42 mL/min/{1.73_m2} — AB (ref >59)
Glucose: 92 mg/dL (ref 65–99)
POTASSIUM: 3.7 mmol/L (ref 3.5–5.2)
Sodium: 144 mmol/L (ref 134–144)

## 2017-02-07 NOTE — Telephone Encounter (Signed)
Attempted to call patient in regards to Cardiac Rehab and ask about at home PT - Patient stated she has not started at home PT. She is waiting on [place] to call to set that up. Once set up and finished with at home PT - she would like to attend our Cardiac Rehab Program. Patient would like a follow up call at the beginning of the year.

## 2017-02-11 ENCOUNTER — Encounter: Payer: Self-pay | Admitting: Thoracic Surgery (Cardiothoracic Vascular Surgery)

## 2017-02-21 ENCOUNTER — Other Ambulatory Visit: Payer: Self-pay

## 2017-02-21 ENCOUNTER — Telehealth: Payer: Self-pay | Admitting: Cardiovascular Disease

## 2017-02-21 ENCOUNTER — Encounter: Payer: Self-pay | Admitting: Physician Assistant

## 2017-02-21 ENCOUNTER — Ambulatory Visit: Payer: Medicare Other | Admitting: Physician Assistant

## 2017-02-21 ENCOUNTER — Ambulatory Visit (HOSPITAL_COMMUNITY): Payer: Medicare Other | Attending: Cardiovascular Disease

## 2017-02-21 VITALS — BP 160/72 | HR 90 | Ht 66.0 in | Wt 124.6 lb

## 2017-02-21 DIAGNOSIS — I071 Rheumatic tricuspid insufficiency: Secondary | ICD-10-CM | POA: Diagnosis not present

## 2017-02-21 DIAGNOSIS — I4891 Unspecified atrial fibrillation: Secondary | ICD-10-CM | POA: Insufficient documentation

## 2017-02-21 DIAGNOSIS — I350 Nonrheumatic aortic (valve) stenosis: Secondary | ICD-10-CM | POA: Diagnosis not present

## 2017-02-21 DIAGNOSIS — E785 Hyperlipidemia, unspecified: Secondary | ICD-10-CM | POA: Insufficient documentation

## 2017-02-21 DIAGNOSIS — I480 Paroxysmal atrial fibrillation: Secondary | ICD-10-CM

## 2017-02-21 DIAGNOSIS — Z952 Presence of prosthetic heart valve: Secondary | ICD-10-CM

## 2017-02-21 DIAGNOSIS — I5032 Chronic diastolic (congestive) heart failure: Secondary | ICD-10-CM | POA: Diagnosis not present

## 2017-02-21 DIAGNOSIS — I10 Essential (primary) hypertension: Secondary | ICD-10-CM | POA: Diagnosis not present

## 2017-02-21 DIAGNOSIS — Z950 Presence of cardiac pacemaker: Secondary | ICD-10-CM

## 2017-02-21 DIAGNOSIS — I319 Disease of pericardium, unspecified: Secondary | ICD-10-CM | POA: Diagnosis not present

## 2017-02-21 DIAGNOSIS — I129 Hypertensive chronic kidney disease with stage 1 through stage 4 chronic kidney disease, or unspecified chronic kidney disease: Secondary | ICD-10-CM | POA: Insufficient documentation

## 2017-02-21 DIAGNOSIS — N189 Chronic kidney disease, unspecified: Secondary | ICD-10-CM | POA: Diagnosis not present

## 2017-02-21 DIAGNOSIS — I2510 Atherosclerotic heart disease of native coronary artery without angina pectoris: Secondary | ICD-10-CM

## 2017-02-21 LAB — ECHOCARDIOGRAM COMPLETE: Height: 66 in

## 2017-02-21 MED ORDER — AMLODIPINE BESYLATE 2.5 MG PO TABS: 2.5000 mg | ORAL_TABLET | Freq: Every day | ORAL | 11 refills | Status: DC

## 2017-02-21 NOTE — Telephone Encounter (Signed)
Follow Up:    Returning your call,cocnerning her Echo results.

## 2017-02-21 NOTE — Progress Notes (Signed)
[facility]                                       Cardiology Office Note    Date:  02/21/2017   ID:  AILIE GAGE, DOB June 09, 1929, MRN [id number]  PCP:  [name], MD  Cardiologist: Dr. Martinique / Dr. [name] (EP) / Dr. Burt Knack & Dr. Roxy Manns (TAVR)  CC: 1 month s/p TAVR   History of Present Illness:  [name] [name] is a 83 y.o. female with a history of of PAF on Eliquis, tachy-brady s/p PPM, HTN, HLD, hypothyroidism, CKD, CAD s/p PCI/DES to LAD (12/20/16) and severe aortic stenosis s/p TAVR (01/29/17) who presents to clinic for follow up.   Previous echocardiograms had demonstrated moderate aortic stenosis with normal left ventricular systolic function. An echo in April 2018 showed a mean gradient of 30 mmHg. The dimensionless index was 0.19. Over the past 4-6 months the patient has had progressive exertional shortness of breath, fatigue, and chest discomfort. She was seen in the atrial fibrillation clinic and noted to be in atrial fibrillation. She was started on amiodarone and converted to sinus rhythm. She was also found to have failure of her right atrial lead and underwent explant and placement of a new dual-chamber pacemaker on 10/09/2016. She continued to have symptoms and underwent cardiac catheterization on 11/27/2016. This showed severe aortic stenosis with a mean transvalvular gradient of 40 mmHg and anaortic valve area of 0.61 cm. Cardiac catheterization also showed an 85% stenosis of the mid LAD with otherwise nonobstructive disease. This was treated with a drug-eluting stent on 12/20/2016. She was switched to Eliquis in addition to aspirin and Plavix. She said that she has felt better since the PCI with an improvement in her symptoms.  She underwent successful TAVR with a 69mm Edwards Sapien THV via the TF approach on 01/29/17. Post operative echo showed good valve placement with trivial PVL. Post operative course was  complicated by acute pericarditis, afib with RVR and post operative anemia. She was treated with ibuprofen/colchicine, amiodarone loading and 1 U PRBCs. She was discharged on ASA and plavix. Home Eliquis 2.5mg  BID was held at discharge given acute pericarditis and concern for hemorraghic conversion. Given intermittent afib post operatively, she was discharged on amio 200mg  BID.  Later amiodarone was decreased to 100 mg daily due to nausea.  Limited echo 02/06/17 showed no pericardial effusion. ASA was discontinued and she was started back on Eliquis.  She was also switched from as needed Lasix to Lasix 20 mg daily given volume overload after discharge.  Today she presents to clinic for follow-up.  She is feeling quite well after her surgery.  No more chest pain or shortness of breath.  She does have some lower extremity edema that is worse after being on her feet all day, but no orthopnea or PND.  She had an episode of A. fib the other night but it resolved on its own after getting up and walking around.  She also had an episode of dizziness when standing up quickly from bending over during Christmas gifts but no syncope.  No blood in her stool or urine.  Past Medical History:  Diagnosis Date  . Aortic stenosis, severe   . CKD (chronic kidney disease)   . Complication of anesthesia   . Coronary artery disease    a.  12/20/2016: s/p DES to LAD   . Diverticulosis   . GERD (gastroesophageal reflux disease)   . Hemorrhoids   . Hyperlipidemia   . Hypertension   . Hypothyroidism   . Osteoarthritis   . Paroxysmal atrial fibrillation (HCC)    a. on Bronson with Xarelto but switched to Eliquis after stenting.   . Presence of permanent cardiac pacemaker   . S/P TAVR (transcatheter aortic valve replacement) 01/29/2017   23 mm Edwards Sapien 3 transcatheter heart valve placed via percutaneous right transfemoral approach   . Sick sinus syndrome ([place])    a. s/p PPM (MDT)    Past Surgical History:  Procedure  Laterality Date  . ABDOMINAL HYSTERECTOMY  1980  . BASAL CELL CARCINOMA EXCISION    . CARDIAC CATHETERIZATION  ~ 11/2016  . CATARACT EXTRACTION W/ INTRAOCULAR LENS  IMPLANT, BILATERAL Bilateral   . CORONARY ANGIOPLASTY WITH STENT PLACEMENT  12/20/2016   "1 stent"  . CORONARY STENT INTERVENTION N/A 12/20/2016   Procedure: CORONARY STENT INTERVENTION;  Surgeon: Martinique, Peter M, MD;  Location: [facility];  Service: Cardiovascular;  Laterality: N/A;  . FINGER SURGERY Left    "fell; developed skiers thumb; had to operate on it"  . INSERT / REPLACE / [place]   for syptomatic bradycardia and syncope -- in [facility]  . LEAD REVISION/REPAIR N/A 10/09/2016   New left subclavian MDT Adapta L PPM dual chamber system implanted by Dr [name] with previously placed R subclavian system abandoned  . PACEMAKER GENERATOR CHANGE  2001   pulse generator replacement by Dr. Rollene Fare   . PACEMAKER GENERATOR CHANGE  04/01/2008   PPM Medtronic -- model # [id number] serial # [id number] -- pulse generator replacement by Dr. Verlon Setting   . RIGHT/LEFT HEART CATH AND CORONARY ANGIOGRAPHY N/A 11/27/2016   Procedure: RIGHT/LEFT HEART CATH AND CORONARY ANGIOGRAPHY;  Surgeon: Martinique, Peter M, MD;  Location: [facility];  Service: Cardiovascular;  Laterality: N/A;  . SQUAMOUS CELL CARCINOMA EXCISION    . TEE WITHOUT CARDIOVERSION N/A 01/29/2017   Procedure: TRANSESOPHAGEAL ECHOCARDIOGRAM (TEE);  Surgeon: Sherren Mocha, MD;  Location: [facility];  Service: Open Heart Surgery;  Laterality: N/A;  . TONSILLECTOMY    . TRANSCATHETER AORTIC VALVE REPLACEMENT, TRANSFEMORAL N/A 01/29/2017   Procedure: TRANSCATHETER AORTIC VALVE REPLACEMENT, TRANSFEMORAL;  Surgeon: Sherren Mocha, MD;  Location: [facility];  Service: Open Heart Surgery;  Laterality: N/A;    Current Medications: Outpatient Medications Prior to Visit  Medication Sig Dispense Refill  . acetaminophen (TYLENOL) 500 MG tablet Take 1,000 mg by mouth every  4 (four) hours as needed for moderate pain or headache.     Marland Kitchen amiodarone (PACERONE) 200 MG tablet Take 0.5 tablets (100 mg total) by mouth daily. 45 tablet 3  . apixaban (ELIQUIS) 2.5 MG TABS tablet Take 1 tablet (2.5 mg total) by mouth 2 (two) times daily. 180 tablet 2  . calcium carbonate (OS-CAL) 600 MG TABS Take 600 mg by mouth every evening.     . Cholecalciferol (VITAMIN D) 1000 UNITS capsule Take 1,000 Units by mouth daily.      . clindamycin (CLEOCIN) 300 MG capsule Take 2 tablets by mouth one hour prior to any dental appointments and stomach or bladder procedure. 2 capsule 4  . clopidogrel (PLAVIX) 75 MG tablet Take 1 tablet (75 mg total) by mouth daily. (Patient taking differently: Take 75 mg by mouth every evening. ) 90 tablet 3  . dicyclomine (BENTYL) 10 MG capsule  Take 10 mg by mouth 2 (two) times daily as needed for spasms.   1  . furosemide (LASIX) 20 MG tablet Take 1 tablet (20 mg total) by mouth daily. 90 tablet 3  . levothyroxine (SYNTHROID, LEVOTHROID) 100 MCG tablet Take 100 mcg by mouth daily before breakfast.     . loratadine (CLARITIN) 10 MG tablet Take 10 mg by mouth daily as needed for allergies.    . metoprolol tartrate (LOPRESSOR) 25 MG tablet Take 0.5 tablets (12.5 mg total) by mouth 2 (two) times daily. 30 tablet 6  . nitroGLYCERIN (NITROSTAT) 0.4 MG SL tablet Place 1 tablet (0.4 mg total) under the tongue every 5 (five) minutes as needed for chest pain. 25 tablet 11  . pantoprazole (PROTONIX) 40 MG tablet Take 1 tablet (40 mg total) by mouth daily. 90 tablet 1  . polyvinyl alcohol (LIQUIFILM TEARS) 1.4 % ophthalmic solution Place 1-2 drops into both eyes 3 (three) times daily as needed for dry eyes.    . rosuvastatin (CRESTOR) 10 MG tablet Take 1 tablet (10 mg total) by mouth daily. 90 tablet 3  . sodium chloride (OCEAN) 0.65 % SOLN nasal spray Place 1 spray into both nostrils as needed for congestion.     No facility-administered medications prior to visit.       Allergies:   Penicillins; Tetanus toxoid; Demerol; and Codeine   Social History   Socioeconomic History  . Marital status: Widowed    Spouse name: None  . Number of children: 0  . Years of education: None  . Highest education level: None  Social Needs  . Financial resource strain: None  . Food insecurity - worry: None  . Food insecurity - inability: None  . Transportation needs - medical: None  . Transportation needs - non-medical: None  Occupational History    Employer: RETIRED  Tobacco Use  . Smoking status: Former Smoker    Packs/day: 1.50    Years: 30.00    Pack years: 45.00    Types: Cigarettes    Last attempt to quit: 03/05/1978    Years since quitting: 38.9  . Smokeless tobacco: Never Used  Substance and Sexual Activity  . Alcohol use: No  . Drug use: No  . Sexual activity: None  Other Topics Concern  . None  Social History Narrative  . None     Family History:  The patient's family history includes Alzheimer's disease in her mother; Cancer in her father.     ROS:   Please see the history of present illness.    ROS All other systems reviewed and are negative.   PHYSICAL EXAM:   VS:  BP (!) 160/72 (BP Location: Right Arm, Patient Position: Sitting, Cuff Size: Normal)   Pulse 90   Ht 5\' 6"  (1.676 m)   Wt 124 lb 9.6 oz (56.5 kg)   SpO2 94%   BMI 20.11 kg/m    GEN: Well nourished, well developed, in no acute distress  HEENT: normal  Neck: no JVD, carotid bruits, or masses Cardiac: RRR; no murmur. No rubs, or gallops, trace bilateral LE edema  Respiratory:  clear to auscultation bilaterally, normal work of breathing GI: soft, nontender, nondistended, + BS MS: no deformity or atrophy  Skin: warm and dry, no rash Neuro:  Alert and Oriented x 3, Strength and sensation are intact Psych: euthymic mood, full affect   Wt Readings from Last 3 Encounters:  02/21/17 124 lb 9.6 oz (56.5 kg)  02/06/17 128 lb (58.1  kg)  02/01/17 132 lb 9.6 oz (60.1 kg)       Studies/Labs Reviewed:   EKG:  EKG is NOT ordered today.    Recent Labs: 01/28/2017: ALT 15; B Natriuretic Peptide 522.0 01/30/2017: Magnesium 1.8 02/06/2017: BUN 14; Creatinine, Ser 1.19; Hemoglobin 10.5; Platelets 236; Potassium 3.7; Sodium 144   Lipid Panel No results found for: CHOL, TRIG, HDL, CHOLHDL, VLDL, LDLCALC, LDLDIRECT  Additional studies/ records that were reviewed today include:  TAVR OPERATIVE NOTE   Date of Procedure:01/29/2017  Preoperative Diagnosis:Severe Aortic Stenosis   Postoperative Diagnosis:Same   Procedure:   Transcatheter Aortic Valve Replacement - Percutaneous Transfemoral Approach Edwards Sapien 3 THV (size 56mm, model # 9600TFX, serial # [id number])  Co-Surgeons:Clarence H. Roxy Manns, MD and Sherren Mocha, MD  Pre-operative Echo Findings: ? Severe aortic stenosis ? Normalleft ventricular systolic function  Post-operative Echo Findings: ? Noparavalvular leak ? Normalleft ventricular systolic function  ____________________  Post operative echo 01/30/17 Study Conclusions - Left ventricle: The cavity size was normal. Wall thickness was increased in a pattern of mild LVH. Systolic function was normal. The estimated ejection fraction was in the range of 60% to 65%. - Aortic valve: Post TAVR with 23 mm Sapien 3 valve trivial peri prosthetic regurgitation. - Mitral valve: There was mild regurgitation. - Atrial septum: No defect or patent foramen ovale was identified. - Pulmonary arteries: PA peak pressure: 34 mm Hg (S).  _____________________   Limited echo 02/06/17 Study Conclusions - Left ventricle: The cavity size was normal. Wall thickness was normal. Systolic function was normal. The estimated ejection fraction was in the range of 60% to 65%. Wall motion was normal; there were no regional wall motion abnormalities. Doppler parameters  are consistent with abnormal left ventricular relaxation (grade 1 diastolic dysfunction). - Aortic valve: Bioprosthetic aortic valve s/p TAVR. Trivial aortic insufficiency. No significant bioprosthetic valve stenosis. Mean gradient (S): 12 mm Hg. - Mitral valve: At least moderate eccentric mitral regurgitation, probably not fully visualized. - Left atrium: The atrium was moderately dilated. - Right ventricle: The cavity size was mildly dilated. Pacer wire or catheter noted in right ventricle. Systolic function was normal. - Right atrium: The atrium was moderately dilated. - Tricuspid valve: There was mild-moderate regurgitation. Peak RV-RA gradient (S): 27 mm Hg. - Pulmonary arteries: PA peak pressure: 42 mm Hg (S). - Systemic veins: IVC measured 2.3 cm with < 50% respirophasic variation, suggesting RA pressure 15 mmHg. Impressions: - Normal LV size with EF 60-65%. Mildly dilated RV with normal systolic function. Bioprosthetic aortic valve s/p TAVR. No significant bioprosthetic valvular stenosis, trivial peri-valvular regurgitation. At least moderate eccentric mitral regurgitation, probably not fully visualized. Moderate biatrial enlargement. Mild pulmonary hypertension.  _____________________  2D ECHO 02/21/17 (1 month s/p TAVR) Study Conclusions - Left ventricle: Wall thickness was increased in a pattern of mild   LVH. Systolic function was normal. The estimated ejection   fraction was in the range of 55% to 65%. - Aortic valve: Post TAVR with 23 mm Sapien 3 valve. Meand gradient   9 mmHg slightly lower than previous. Leaflets not well visualized   Trivial peri prosthetic regurgitation and mild ? central AR Valve   area (VTI): 1.54 cm^2. Valve area (Vmax): 1.41 cm^2. Valve area   (Vmean): 1.5 cm^2. - Mitral valve: Thickening and mild diastolic doming of anterior   leaflet Moderate eccentric MR directed posteriorly. There was   moderate  regurgitation. - Left atrium: The atrium was mildly dilated. - Atrial septum: No defect or  patent foramen ovale was identified. - Tricuspid valve: There was moderate regurgitation. - Pulmonary arteries: PA peak pressure: 32 mm Hg (S).   ASSESSMENT & PLAN:   Severe AS s/p TAVR: doing well with NYHA class I symptoms.  2D echo today shows normal functioning bioprosthetic valve with a mean gradient of 9 mmHg and trivial PVL as well as mild central AR.  Continue on Plavix and Eliquis given stenting in 12/2016.  She has a prescription for clindamycin for SBE prophylaxis.  I will see her back in 1 year with a follow-up echo.  Post op pericarditis:  This has resolved.  Chronic diastolic CHF:She appears euvolemic on Lasix 20 mg daily.  She does get some occasional worsening of her lower extremity edema.  I have encouraged her to elevate her legs and wear compression stockings.  I also told her it is okay to take an extra 20 mg of Lasix as needed.  CAD: s/p DES to LAD on 10/18. She was treated with triple therapy with low dose Elqiuis, ASA and plavix. She will now continue on Eliquis 2.5mg  BID and plavix 75mg  daily.   PAF: she had intermittent afib after her TAVR. This resolved with amio loading.  She had a short episode of atrial fibrillation the other night that self resolved.  Sounds regular on exam today.  Continue amiodarone 100 mg daily and Eliquis 2.5 mg twice daily  Of note, she is on the border of low dose and full dose Eliquis. (weight fluctuates ~ 60kg, >81 yo and creat 1.35.) Given that she requires plavix with recent PCI and TAVR, we feel lower dose is more appropriate.  HTN:  BP elevated today.  It was 150/80 on my personal recheck.  I have asked her to resume her home amlodipine 2.5 mg daily, which was held at discharge secondary to soft blood pressures.  I have asked her to keep a log of her blood pressures and call us back if it is consistently over 140/90 and we will increase  amlodipine to 5 mg daily.  HLD: Continue statin  SSS s/p PPM: Followed by Dr. [name]  CKD: Creatinine stable around 1.19 on 02/06/17.    Medication Adjustments/Labs and Tests Ordered: Current medicines are reviewed at length with the patient today.  Concerns regarding medicines are outlined above.  Medication changes, Labs and Tests ordered today are listed in the Patient Instructions below. Patient Instructions  Medication Instructions:  1) Start taking AMLODIPINE 2.5 mg daily. Keep a blood pressure log. If your blood pressure is consistently higher than 140/90 please call our office. We will increase the dosage.  Labwork: None  Testing/Procedures: Your provider has requested that you have an echocardiogram in 1 year. Echocardiography is a painless test that uses sound waves to create images of your heart. It provides your doctor with information about the size and shape of your heart and how well your heart's chambers and valves are working. This procedure takes approximately one hour. There are no restrictions for this procedure.  Follow-Up: Your provider wants you to follow-up in: 1 year with Nell Range, PA. You will receive a reminder letter in the mail two months in advance. If you don't receive a letter, please call our office to schedule the follow-up appointment.    Any Other Special Instructions Will Be Listed Below (If Applicable).     If you need a refill on your cardiac medications before your next appointment, please call your pharmacy.      Signed,  Angelena Form, PA-C  02/21/2017 4:32 PM    [facility] [address], [place], [place]  [address] Phone: [phone]; Fax: [phone]

## 2017-02-21 NOTE — Telephone Encounter (Signed)
Per Joellen Jersey Thompson's OV note, "2D echo today shows normal functioning bioprosthetic valve with a mean gradient of 9 mmHg and trivial PVL as well as mild central AR."  Informed patient of results and verbal understanding expressed.

## 2017-02-21 NOTE — Patient Instructions (Signed)
Medication Instructions:  1) Start taking AMLODIPINE 2.5 mg daily. Keep a blood pressure log. If your blood pressure is consistently higher than 140/90 please call our office. We will increase the dosage.  Labwork: None  Testing/Procedures: Your provider has requested that you have an echocardiogram in 1 year. Echocardiography is a painless test that uses sound waves to create images of your heart. It provides your doctor with information about the size and shape of your heart and how well your heart's chambers and valves are working. This procedure takes approximately one hour. There are no restrictions for this procedure.  Follow-Up: Your provider wants you to follow-up in: 1 year with [name] Range, PA. You will receive a reminder letter in the mail two months in advance. If you don't receive a letter, please call our office to schedule the follow-up appointment.    Any Other Special Instructions Will Be Listed Below (If Applicable).     If you need a refill on your cardiac medications before your next appointment, please call your pharmacy.

## 2017-02-25 ENCOUNTER — Telehealth: Payer: Self-pay | Admitting: Physician Assistant

## 2017-02-25 NOTE — Telephone Encounter (Signed)
  [facility]   MULTIDISCIPLINARY HEART VALVE TEAM   Patient called bc her BP was still high on amlodipine 2.5 mg daily. I asked her to increase it to 5mg  daily and continue to keep a log until its under consistent control <140/90.  Angelena Form PA-C  [facility]

## 2017-02-27 DIAGNOSIS — I2510 Atherosclerotic heart disease of native coronary artery without angina pectoris: Secondary | ICD-10-CM | POA: Insufficient documentation

## 2017-02-27 DIAGNOSIS — I495 Sick sinus syndrome: Secondary | ICD-10-CM | POA: Insufficient documentation

## 2017-02-27 DIAGNOSIS — K5790 Diverticulosis of intestine, part unspecified, without perforation or abscess without bleeding: Secondary | ICD-10-CM | POA: Insufficient documentation

## 2017-02-27 DIAGNOSIS — Z950 Presence of cardiac pacemaker: Secondary | ICD-10-CM | POA: Insufficient documentation

## 2017-02-27 DIAGNOSIS — I10 Essential (primary) hypertension: Secondary | ICD-10-CM | POA: Insufficient documentation

## 2017-02-27 DIAGNOSIS — M1990 Unspecified osteoarthritis, unspecified site: Secondary | ICD-10-CM | POA: Insufficient documentation

## 2017-02-27 DIAGNOSIS — E039 Hypothyroidism, unspecified: Secondary | ICD-10-CM | POA: Insufficient documentation

## 2017-02-27 DIAGNOSIS — E785 Hyperlipidemia, unspecified: Secondary | ICD-10-CM | POA: Insufficient documentation

## 2017-02-27 DIAGNOSIS — K649 Unspecified hemorrhoids: Secondary | ICD-10-CM | POA: Insufficient documentation

## 2017-02-27 DIAGNOSIS — K219 Gastro-esophageal reflux disease without esophagitis: Secondary | ICD-10-CM | POA: Insufficient documentation

## 2017-02-27 DIAGNOSIS — T4145XA Adverse effect of unspecified anesthetic, initial encounter: Secondary | ICD-10-CM | POA: Insufficient documentation

## 2017-02-27 DIAGNOSIS — I480 Paroxysmal atrial fibrillation: Secondary | ICD-10-CM | POA: Insufficient documentation

## 2017-02-27 DIAGNOSIS — I350 Nonrheumatic aortic (valve) stenosis: Secondary | ICD-10-CM | POA: Insufficient documentation

## 2017-02-27 DIAGNOSIS — T8859XA Other complications of anesthesia, initial encounter: Secondary | ICD-10-CM | POA: Insufficient documentation

## 2017-03-04 ENCOUNTER — Telehealth: Payer: Self-pay | Admitting: Physician Assistant

## 2017-03-04 ENCOUNTER — Other Ambulatory Visit: Payer: Self-pay | Admitting: Physician Assistant

## 2017-03-04 MED ORDER — AMLODIPINE BESYLATE 5 MG PO TABS: 7.5000 mg | ORAL_TABLET | Freq: Every day | ORAL | 11 refills | Status: DC

## 2017-03-04 NOTE — Telephone Encounter (Signed)
  [facility]   MULTIDISCIPLINARY HEART VALVE TEAM   Patient called about a dizziness when getting up from the toilet today. It lasted about an hour and was associated with nausea. Now resolved. Her BP continues to run high ~150/90. She is on amlodipine 5mg  daily. I have asked her to increase this to 7.5mg  daily. If she continues to have issues with dizziness and nausea she will see her PCP.   Angelena Form PA-C  [facility]

## 2017-03-06 ENCOUNTER — Telehealth (HOSPITAL_COMMUNITY): Payer: Self-pay

## 2017-03-06 NOTE — Telephone Encounter (Signed)
Attempted to call patient in regards to Cardiac Rehab - Lm on Vm °

## 2017-03-07 ENCOUNTER — Other Ambulatory Visit: Payer: Self-pay | Admitting: Physician Assistant

## 2017-03-07 DIAGNOSIS — Z952 Presence of prosthetic heart valve: Secondary | ICD-10-CM

## 2017-03-07 MED ORDER — AMLODIPINE BESYLATE 10 MG PO TABS: 10.0000 mg | ORAL_TABLET | Freq: Every day | ORAL | 3 refills | Status: DC

## 2017-03-10 ENCOUNTER — Other Ambulatory Visit (HOSPITAL_COMMUNITY): Payer: Self-pay | Admitting: Nurse Practitioner

## 2017-03-13 NOTE — Progress Notes (Signed)
[name] [name] Date of Birth: 03-15-1929 Medical Record #[id number]  History of Present Illness: [name] [name] is seen today for follow up s/p recent PCI with DES to LAD.  She has a history of paroxysmal atrial fibrillation with tachy-brady syndrome and is s/p pacemaker implant. She is on chronic anticoagulation with Xarelto. Echo in July 2015 showed moderate aortic stenosis that had progressed since 2012. Repeat in July 2016 and July 2017 showed no change. In April 2017 she had atrial fibrillation that converted spontaneously. This was felt to be triggered by a steroid injection. Her metoprolol was increased at that time. When seen in June 2017 she was noted to be in persistent Afib. We placed her on Flecainide but this was later stopped due to side effects. She was seen in November with recurrent and persistent Afib. Rate was controlled but she was symptomatic. Seen in Afib clinic and started on amiodarone with subsequent conversion to NSR. Ecg in January showed NSR.  When last see she complained  of  SOB with activity- this was present even before amiodarone and hasn't improved with restoration of NSR.  She states that any walking or using her arms makes her SOB. No edema.  She does describe a sinking feeling and discomfort  in her epigastrium that makes her feel very weak. She did have a upper and lower EGD which was negative except for few polyps. She had PFTs in December that showed mild obstructive defect with marked decrease in diffusion capacity. CT of the Abdomen was negative for any pathology. She reports Dr. Virgina [name] did an [place] as well. Her last Echo was in April as noted below. Myoview study was normal. When seen by Dr. Rayann [name] in late July it was noted that she had failure of her RA lead. Her device was reprogrammed but she developed pectoral stimulation. She underwent explantation of her old generator and placement of a new left chest dual chamber pacemaker on August 7. Because of her ongoing  symptoms we recommended a right and left heart cath and this was done on 11/27/16. This demonstrated severe AS as well as a severe stenosis in the mid LAD. She subsequently underwent successful PCI of the LAD with DES on 12/20/16 with 2.75 x 16 mm Promus stent.   She underwent TAVR on November 27 with a #23 Edwards Sapien valve. Post op course complicated by pericarditis and AFib with RVR she was treated with anti-inflammatories and Amiodarone. Later Eliquis resumed and ASA stopped. Still on Plavix for stent. Follow up Echo was [place].   On follow up she is doing well.  She denies any chest pain or dyspnea. She is followed by [facility] RN but is planning to initiate Cardiac Rehab. On daily lasix creatinine has gone up from 1.18 to 1.7. She does note feeling wiped out after taking morning meds. This usually resolves later in day.   Current Outpatient Medications on File Prior to Visit  Medication Sig Dispense Refill  . acetaminophen (TYLENOL) 500 MG tablet Take 1,000 mg by mouth every 4 (four) hours as needed for moderate pain or headache.     [name] amiodarone (PACERONE) 200 MG tablet Take 0.5 tablets (100 mg total) by mouth daily. 45 tablet 3  . amLODipine (NORVASC) 10 MG tablet Take 1 tablet (10 mg total) by mouth daily. 90 tablet 3  . apixaban (ELIQUIS) 2.5 MG TABS tablet Take 1 tablet (2.5 mg total) by mouth 2 (two) times daily. 180 tablet 2  . calcium carbonate (OS-CAL)  600 MG TABS Take 600 mg by mouth every evening.     . Cholecalciferol (VITAMIN D) 1000 UNITS capsule Take 1,000 Units by mouth daily.      . clindamycin (CLEOCIN) 300 MG capsule Take 2 tablets by mouth one hour prior to any dental appointments and stomach or bladder procedure. 2 capsule 4  . clopidogrel (PLAVIX) 75 MG tablet Take 1 tablet (75 mg total) by mouth daily. (Patient taking differently: Take 75 mg by mouth every evening. ) 90 tablet 3  . dicyclomine (BENTYL) 10 MG capsule Take 10 mg by mouth 2 (two) times daily as needed for spasms.   1   . furosemide (LASIX) 20 MG tablet Take 1 tablet (20 mg total) by mouth daily. 90 tablet 3  . levothyroxine (SYNTHROID, LEVOTHROID) 100 MCG tablet Take 100 mcg by mouth daily before breakfast.     . loratadine (CLARITIN) 10 MG tablet Take 10 mg by mouth daily as needed for allergies.    . metoprolol tartrate (LOPRESSOR) 25 MG tablet Take 0.5 tablets (12.5 mg total) by mouth 2 (two) times daily. 30 tablet 6  . nitroGLYCERIN (NITROSTAT) 0.4 MG SL tablet Place 1 tablet (0.4 mg total) under the tongue every 5 (five) minutes as needed for chest pain. 25 tablet 11  . pantoprazole (PROTONIX) 40 MG tablet Take 1 tablet (40 mg total) by mouth daily. 90 tablet 1  . polyvinyl alcohol (LIQUIFILM TEARS) 1.4 % ophthalmic solution Place 1-2 drops into both eyes 3 (three) times daily as needed for dry eyes.    . rosuvastatin (CRESTOR) 10 MG tablet Take 1 tablet (10 mg total) by mouth daily. 90 tablet 3  . sodium chloride (OCEAN) 0.65 % SOLN nasal spray Place 1 spray into both nostrils as needed for congestion.     No current facility-administered medications on file prior to visit.     Allergies  Allergen Reactions  . Penicillins Anaphylaxis and Other (See Comments)    Has patient had a PCN reaction causing immediate rash, facial/tongue/throat swelling, SOB or lightheadedness with hypotension: Yes Has patient had a PCN reaction causing severe rash involving mucus membranes or skin necrosis: No Has patient had a PCN reaction that required hospitalization: No Has patient had a PCN reaction occurring within the last 10 years: No If all of the above answers are "NO", then may proceed with Cephalosporin use.   . Tetanus Toxoid Anaphylaxis  . Demerol Other (See Comments)    Severe nausea  . Codeine Nausea Only and Other (See Comments)    Severe nausea    Past Medical History:  Diagnosis Date  . Aortic stenosis, severe   . CKD (chronic kidney disease)   . Complication of anesthesia   . Coronary artery  disease    a. 12/20/2016: s/p DES to LAD   . Diverticulosis   . GERD (gastroesophageal reflux disease)   . Hemorrhoids   . Hyperlipidemia   . Hypertension   . Hypothyroidism   . Osteoarthritis   . Paroxysmal atrial fibrillation (HCC)    a. on [place] with Xarelto but switched to Eliquis after stenting.   . Presence of permanent cardiac pacemaker   . S/P TAVR (transcatheter aortic valve replacement) 01/29/2017   23 mm Edwards Sapien 3 transcatheter heart valve placed via percutaneous right transfemoral approach   . Sick sinus syndrome ([place])    a. s/p PPM (MDT)    Past Surgical History:  Procedure Laterality Date  . ABDOMINAL HYSTERECTOMY  1980  .  BASAL CELL CARCINOMA EXCISION    . CARDIAC CATHETERIZATION  ~ 11/2016  . CATARACT EXTRACTION W/ INTRAOCULAR LENS  IMPLANT, BILATERAL Bilateral   . CORONARY ANGIOPLASTY WITH STENT PLACEMENT  12/20/2016   "1 stent"  . CORONARY STENT INTERVENTION N/A 12/20/2016   Procedure: CORONARY STENT INTERVENTION;  Surgeon: Martinique, Miabella Shannahan M, MD;  Location: [facility];  Service: Cardiovascular;  Laterality: N/A;  . FINGER SURGERY Left    "fell; developed skiers thumb; had to operate on it"  . INSERT / REPLACE / [place]   for syptomatic bradycardia and syncope -- in [place]  . LEAD REVISION/REPAIR N/A 10/09/2016   New left subclavian MDT Adapta L PPM dual chamber system implanted by Dr [name] [name] with previously placed R subclavian system abandoned  . PACEMAKER GENERATOR CHANGE  2001   pulse generator replacement by Dr. Rollene Fare   . PACEMAKER GENERATOR CHANGE  04/01/2008   PPM Medtronic -- model # [id number] serial # [id number] -- pulse generator replacement by Dr. Verlon Setting   . RIGHT/LEFT HEART CATH AND CORONARY ANGIOGRAPHY N/A 11/27/2016   Procedure: RIGHT/LEFT HEART CATH AND CORONARY ANGIOGRAPHY;  Surgeon: Martinique, Debbra Digiulio M, MD;  Location: [facility];  Service: Cardiovascular;  Laterality: N/A;  . SQUAMOUS CELL CARCINOMA EXCISION      . TEE WITHOUT CARDIOVERSION N/A 01/29/2017   Procedure: TRANSESOPHAGEAL ECHOCARDIOGRAM (TEE);  Surgeon: Sherren Mocha, MD;  Location: [place];  Service: Open Heart Surgery;  Laterality: N/A;  . TONSILLECTOMY    . TRANSCATHETER AORTIC VALVE REPLACEMENT, TRANSFEMORAL N/A 01/29/2017   Procedure: TRANSCATHETER AORTIC VALVE REPLACEMENT, TRANSFEMORAL;  Surgeon: Sherren Mocha, MD;  Location: [facility];  Service: Open Heart Surgery;  Laterality: N/A;    Social History   Tobacco Use  Smoking Status Former Smoker  . Packs/day: 1.50  . Years: 30.00  . Pack years: 45.00  . Types: Cigarettes  . Last attempt to quit: 03/05/1978  . Years since quitting: 39.0  Smokeless Tobacco Never Used    Social History   Substance and Sexual Activity  Alcohol Use No    Family History  Problem Relation Age of Onset  . Cancer Father        lung cancer  . Alzheimer's disease Mother     Review of Systems: As noted in history of present illness.  All other systems were reviewed and are negative.  Physical Exam: BP 132/70   Pulse 84   Ht 5\' 6"  (1.676 m)   Wt 126 lb (57.2 kg)   BMI 20.34 kg/m  GENERAL:  Well appearing, elderly WF in NAD HEENT:  PERRL, EOMI, sclera are clear. Oropharynx is clear. NECK:  No jugular venous distention, carotid upstroke brisk and symmetric, no bruits, no thyromegaly or adenopathy LUNGS:  Clear to auscultation bilaterally CHEST:  Unremarkable HEART:  RRR,  PMI not displaced or sustained,S1 and S2 within normal limits, no S3, no S4: no clicks, no rubs, no murmurs ABD:  Soft, nontender. BS +, no masses or bruits. No hepatomegaly, no splenomegaly EXT:  2 + pulses throughout, no edema, no cyanosis no clubbing SKIN:  Warm and dry.  No rashes NEURO:  Alert and oriented x 3. Cranial nerves II through XII intact. PSYCH:  Cognitively intact      LABORATORY DATA:  Lab Results  Component Value Date   WBC 6.9 02/06/2017   HGB 10.5 (L) 02/06/2017   HCT 32.9 (L) 02/06/2017    PLT 236 02/06/2017   GLUCOSE 92 02/06/2017  ALT 15 01/28/2017   AST 26 01/28/2017   NA 144 02/06/2017   K 3.7 02/06/2017   CL 104 02/06/2017   CREATININE 1.19 (H) 02/06/2017   BUN 14 02/06/2017   CO2 24 02/06/2017   TSH 0.90 06/13/2011   INR 1.23 01/29/2017   HGBA1C 5.5 01/28/2017   Labs dated 07/15/15: cholesterol 229, triglycerides 111, HDL 76, LDL 131.  Dated 07/19/16: cholesterol 247, triglycerides 71, HDL 88, LDL 145. BUN 28, creatinine 1.3. Other chemistries, CBC and TSH normal. Dated 03/15/17: creatinine 1.7, Hgb 11.3.   Echo: 08/18/15 Study Conclusions  - Left ventricle: The cavity size was normal. Wall thickness was  normal. Systolic function was normal. The estimated ejection  fraction was in the range of 55% to 60%. Wall motion was normal;  there were no regional wall motion abnormalities. Features are  consistent with a pseudonormal left ventricular filling pattern,  with concomitant abnormal relaxation and increased filling  pressure (grade 2 diastolic dysfunction). - Aortic valve: There was moderate stenosis. There was mild to  moderate regurgitation directed centrally in the LVOT. - Mitral valve: There was mild regurgitation directed centrally. - Tricuspid valve: There was mild-moderate regurgitation directed  centrally. - Pulmonary arteries: Systolic pressure was mildly increased. PA  peak pressure: 34 mm Hg (S).  Echo 06/20/16: Study Conclusions  - Left ventricle: The cavity size was normal. There was mild   concentric hypertrophy. Systolic function was normal. The   estimated ejection fraction was in the range of 55% to 60%. Wall   motion was normal; there were no regional wall motion   abnormalities. - Aortic valve: Valve mobility was restricted. There was moderate   stenosis. There was moderate regurgitation. Peak velocity (S):   370 cm/s. Mean gradient (S): 30 mm Hg. Regurgitation pressure   half-time: 449 ms. - Mitral valve: Transvalvular  velocity was within the normal range.   There was no evidence for stenosis. There was mild regurgitation. - Left atrium: The atrium was moderately dilated. - Right ventricle: The cavity size was normal. Wall thickness was   normal. Systolic function was normal. - Tricuspid valve: There was moderate regurgitation. - Pulmonary arteries: Systolic pressure was within the normal   range. PA peak pressure: 36 mm Hg (S).  Impressions:  - No significant changes since 08/2015.  Echo 12/26/16: Study Conclusions  - Left ventricle: The cavity size was normal. Wall thickness was   normal. Systolic function was normal. The estimated ejection   fraction was in the range of 60% to 65%. Wall motion was normal;   there were no regional wall motion abnormalities. Features are   consistent with a pseudonormal left ventricular filling pattern,   with concomitant abnormal relaxation and increased filling   pressure (grade 2 diastolic dysfunction). - Aortic valve: Moderately to severely calcified annulus.   Moderately thickened, moderately calcified leaflets. There was   moderate stenosis. There was mild regurgitation. Valve area   (VTI): 0.88 cm^2. Valve area (Vmax): 0.78 cm^2. Valve area   (Vmean): 0.74 cm^2. - Mitral valve: Calcified annulus. - Right atrium: The atrium was mildly dilated.  Echo 02/21/17: Study Conclusions  - Left ventricle: Wall thickness was increased in a pattern of mild   LVH. Systolic function was normal. The estimated ejection   fraction was in the range of 55% to 65%. - Aortic valve: Post TAVR with 23 mm Sapien 3 valve. Meand gradient   9 mmHg slightly lower than previous. Leaflets not well visualized   Trivial  peri prosthetic regurgitation and mild ? central AR Valve   area (VTI): 1.54 cm^2. Valve area (Vmax): 1.41 cm^2. Valve area   (Vmean): 1.5 cm^2. - Mitral valve: Thickening and mild diastolic doming of anterior   leaflet Moderate eccentric MR directed  posteriorly. There was   moderate regurgitation. - Left atrium: The atrium was mildly dilated. - Atrial septum: No defect or patent foramen ovale was identified. - Tricuspid valve: There was moderate regurgitation. - Pulmonary arteries: PA peak pressure: 32 mm Hg (S).   Myoview 10/03/16: Study Highlights     The left ventricular ejection fraction is normal (55-65%).  Nuclear stress EF: 59%.  There was no ST segment deviation noted during stress.  The study is normal.   1. EF 59%, normal wall motion.  2. No evidence for ischemia or infarction by perfusion images.   Normal study.     Procedures   RIGHT/LEFT HEART CATH AND CORONARY ANGIOGRAPHY  Conclusion     Mid LAD lesion, 30 %stenosed.  Mid LAD to Dist LAD lesion, 85 %stenosed.  Prox RCA to Mid RCA lesion, 20 %stenosed.  Dist RCA lesion, 30 %stenosed.  Ost LM lesion, 10 %stenosed.  LV end diastolic pressure is normal.  LV end diastolic pressure is normal.   1. Single vessel obstructive CAD with 85% mid LAD stenosis 2. Severe aortic stenosis. Mean gradient of 40 mm Hg. AVA 0.61 cm squared with index .37. 3. Normal cardiac output. 4. Normal right heart and LV filling pressures  Plan: will discuss options for AVR. I think her best option based on age and co-morbidities would be TAVR with stenting of mid LAD. Will discuss with heart valve team.   Indications   Nonrheumatic aortic valve stenosis [I35.0 (ICD-10-CM)]  Procedural Details/Technique   Technical Details Indication: 84 yo WF with progressive dyspnea on exertion and aortic stenosis  Procedural Details: The right wrist was prepped, draped, and anesthetized with 1% lidocaine. Using the modified Seldinger technique a 6 Fr slender sheath was placed in the right radial artery and a 5 French sheath was placed in the right brachial vein. We were unable to pass the Swan- Ganz catheter from the right brachial approach due to inability to pass catheter past  the old pacing wires. The right groin was prepped, draped and anesthetized with 1% lidocaine. A 7 Fr sheath was placed percutaneously in the right femoral vein. A Swan-Ganz catheter was used for the right heart catheterization. Standard protocol was followed for recording of right heart pressures and sampling of oxygen saturations. Fick cardiac output was calculated. Standard Judkins catheters were used for selective coronary angiography and left ventricular pressures. There were no immediate procedural complications. The patient was transferred to the post catheterization recovery area for further monitoring.  Contrast: 55 cc   Estimated blood loss <50 mL.  During this procedure the patient was administered the following to achieve and maintain moderate conscious sedation: Versed 1 mg, Fentanyl 25 mcg, while the patient's heart rate, blood pressure, and oxygen saturation were continuously monitored. The period of conscious sedation was 50 minutes, of which I was present face-to-face 100% of this time.    Complications   Complications documented before study signed (11/27/2016 11:04 AM EDT)    No complications were associated with this study.  Documented by Martinique, Yashvi Jasinski M, MD - 11/27/2016 11:04 AM EDT    Coronary Findings   Dominance: Right  Left Main  Ost LM lesion, 10% stenosed. The lesion is moderately calcified.  Left  Anterior Descending  Mid LAD lesion, 30% stenosed. The lesion is moderately calcified.  Mid LAD to Dist LAD lesion, 85% stenosed.  Left Circumflex  Vessel is small.  Right Coronary Artery  Prox RCA to Mid RCA lesion, 20% stenosed.  Dist RCA lesion, 30% stenosed.  Right Heart   Right Heart Pressures LV EDP is normal.    Left Heart   Left Ventricle LV end diastolic pressure is normal.    Coronary Diagrams   Diagnostic Diagram       Implants     No implant documentation for this case.  MERGE Images   Show images for CARDIAC CATHETERIZATION   Link to  Procedure Log   Procedure Log    Hemo Data    Most Recent Value  Fick Cardiac Output 3.62 L/min  Fick Cardiac Output Index 2.22 (L/min)/BSA  Aortic Mean Gradient 39.6 mmHg  Aortic Peak Gradient 38 mmHg  Aortic Valve Area 0.61  Aortic Value Area Index 0.37 cm2/BSA  RA A Wave 9 mmHg  RA V Wave 9 mmHg  RA Mean 6 mmHg  RV Systolic Pressure 34 mmHg  RV Diastolic Pressure 3 mmHg  RV EDP 10 mmHg  PA Systolic Pressure 32 mmHg  PA Diastolic Pressure 11 mmHg  PA Mean 20 mmHg  PW A Wave 18 mmHg  PW V Wave 23 mmHg  PW Mean 16 mmHg  AO Systolic Pressure 361 mmHg  AO Diastolic Pressure 71 mmHg  AO Mean 443 mmHg  LV Systolic Pressure 154 mmHg  LV Diastolic Pressure 5 mmHg  LV EDP 21 mmHg  Arterial Occlusion Pressure Extended Systolic Pressure 008 mmHg  Arterial Occlusion Pressure Extended Diastolic Pressure 67 mmHg  Arterial Occlusion Pressure Extended Mean Pressure 103 mmHg  Left Ventricular Apex Extended Systolic Pressure 676 mmHg  Left Ventricular Apex Extended Diastolic Pressure 3 mmHg  Left Ventricular Apex Extended EDP Pressure 22 mmHg  QP/QS 1  TPVR Index 9 HRUI  TSVR Index 48.14 HRUI  PVR SVR Ratio 0.04  TPVR/TSVR Ratio 0.19   Stent procedure: Conclusion     Mid LAD to Dist LAD lesion, 85 %stenosed.  A STENT PROMUS PREM MR 2.75X16 drug eluting stent was successfully placed.  Post intervention, there is a 0% residual stenosis.   1. Successful stenting of the mid LAD with DES      Assessment / Plan: 1. Atrial fibrillation with sick sinus syndrome. Prior history of  persistent AFib.  Now on amiodarone since  converted to NSR.  Continue long-term anticoagulation. On  Eliquis 2.5 mg bid.  Dose adjusted for age and renal function. S/p pacemaker replacement for failed RA lead.   2. Aortic stenosis- moderate by Echo in April and October but by cardiac cath data is more consistent with  Severe AS. She was symptomatic. Now s/p TAVR. Making a good recovery. Follow up Echo  and exam OK.   3. CAD with significant single vessel obstructive disease in the mid LAD. S/p PCI of the LAD with DES.  On  Plavix and Eliquis.   4. HTN- controlled. I have recommended she take amlodipine in the evening to avoid feeling so wiped out in the day.   5. Hyperlipidemia. On  Crestor 10 mg daily. Needs repeat lipids in 2 months.  6. Chronic diastolic CHF. She is well compensated. Given increase in creatinine with daily lasix will reduce to 5x/wk.   Follow up in 3 months.

## 2017-03-16 DIAGNOSIS — I350 Nonrheumatic aortic (valve) stenosis: Secondary | ICD-10-CM | POA: Diagnosis not present

## 2017-03-18 ENCOUNTER — Encounter: Payer: Self-pay | Admitting: Cardiology

## 2017-03-18 ENCOUNTER — Ambulatory Visit: Payer: Medicare Other | Admitting: Cardiology

## 2017-03-18 VITALS — BP 132/70 | HR 84 | Ht 66.0 in | Wt 126.0 lb

## 2017-03-18 DIAGNOSIS — I5032 Chronic diastolic (congestive) heart failure: Secondary | ICD-10-CM | POA: Diagnosis not present

## 2017-03-18 DIAGNOSIS — I350 Nonrheumatic aortic (valve) stenosis: Secondary | ICD-10-CM

## 2017-03-18 DIAGNOSIS — I10 Essential (primary) hypertension: Secondary | ICD-10-CM

## 2017-03-18 DIAGNOSIS — Z952 Presence of prosthetic heart valve: Secondary | ICD-10-CM | POA: Diagnosis not present

## 2017-03-18 DIAGNOSIS — I2510 Atherosclerotic heart disease of native coronary artery without angina pectoris: Secondary | ICD-10-CM

## 2017-03-18 NOTE — Patient Instructions (Addendum)
Try taking amlodipine in the evening.  Continue your current medication except decrease lasix to 5 days a week  I will see you in 3 months.

## 2017-03-25 ENCOUNTER — Telehealth: Payer: Self-pay | Admitting: Cardiology

## 2017-03-25 NOTE — Telephone Encounter (Signed)
Returned call to patient of Dr. Martinique who is calling about her crestor. She c/o joint pain, muscle pain in her legs. She has no energy in her legs and leg weakness. She has been on crestor since her TAVR on 11/27. She also needs clearance for cardiac rehab at [facility].  Advised would send a message to Dr. Martinique to review and advise

## 2017-03-25 NOTE — Telephone Encounter (Signed)
New Message  Pt c/o medication issue:  1. Name of Medication: Crestor  2. How are you currently taking this medication (dosage and times per day)? 10mg    3. Are you having a reaction (difficulty breathing--STAT)? Yes   4. What is your medication issue? Pt states she will wait to discuss reaction with RN. Please call back to discuss

## 2017-03-25 NOTE — Telephone Encounter (Signed)
Let's hold Crestor for 3 weeks and see if symptoms improve. If no better then probably not the Crestor. If it does improve we will try something else.  Ryley Bachtel Martinique MD, [facility]

## 2017-03-25 NOTE — Telephone Encounter (Signed)
Returned call to patient [name]'s recommendations given.Advised to call back in 3 weeks to report how she is doing.

## 2017-03-26 ENCOUNTER — Encounter: Payer: Self-pay | Admitting: Thoracic Surgery (Cardiothoracic Vascular Surgery)

## 2017-04-01 ENCOUNTER — Telehealth (HOSPITAL_COMMUNITY): Payer: Self-pay

## 2017-04-01 NOTE — Telephone Encounter (Signed)
Attempted to call patient to follow up with HHPT - Lm on vm

## 2017-04-02 ENCOUNTER — Telehealth (HOSPITAL_COMMUNITY): Payer: Self-pay

## 2017-04-02 NOTE — Telephone Encounter (Signed)
Patient returned phone call and left message for me. Attempted to call patient back - lm on vm.

## 2017-04-03 ENCOUNTER — Telehealth (HOSPITAL_COMMUNITY): Payer: Self-pay

## 2017-04-03 NOTE — Telephone Encounter (Signed)
Patient called late afternoon when office was closed and left message for me. Returned patients phone call this morning and spoke with her. Patient stated she believes she had an allergic reaction to her Cholesterol Medicine. She spoke with [name] and he stated he does not want her to begin Rehab until she has been off of that medicine for 3 weeks to see how she does. The medicine affected her in the limbs of her hips, knees, and muscle strength. Will follow up in 2 weeks as she has been off of her medicine for a week now.

## 2017-04-15 ENCOUNTER — Telehealth (HOSPITAL_COMMUNITY): Payer: Self-pay

## 2017-04-15 ENCOUNTER — Ambulatory Visit (INDEPENDENT_AMBULATORY_CARE_PROVIDER_SITE_OTHER): Payer: Medicare Other | Admitting: *Deleted

## 2017-04-15 ENCOUNTER — Telehealth: Payer: Self-pay | Admitting: Cardiology

## 2017-04-15 DIAGNOSIS — I495 Sick sinus syndrome: Secondary | ICD-10-CM | POA: Diagnosis not present

## 2017-04-15 MED ORDER — PRAVASTATIN SODIUM 20 MG PO TABS: 20.0000 mg | ORAL_TABLET | Freq: Every evening | ORAL | 3 refills | Status: DC

## 2017-04-15 NOTE — Telephone Encounter (Signed)
I would have her try pravastatin 20 mg daily instead of Crestor to see if she will tolerate better.   Jamere Stidham Martinique MD, [facility]

## 2017-04-15 NOTE — Telephone Encounter (Signed)
Attempted to call patient in regards to Cardiac Rehab - lm on vm °

## 2017-04-15 NOTE — Progress Notes (Signed)
Remote pacemaker transmission.   

## 2017-04-15 NOTE — Telephone Encounter (Signed)
Left detailed message with MD recommendations (OK per DPR).  Rx(s) sent to pharmacy electronically.

## 2017-04-15 NOTE — Telephone Encounter (Signed)
New message     Pt c/o medication issue:  1. Name of Medication: Crestor  2. How are you currently taking this medication (dosage and times per day)?n/a  3. Are you having a reaction (difficulty breathing--STAT)? NO  4. What is your medication issue?Patient has been off medication for 3 weeks. She states she feels better NOT taking medication. Patient wants to know the next step of medication management

## 2017-04-15 NOTE — Telephone Encounter (Signed)
Patient was advised on 03/06/17 to hold statin. Deferred to MD to review and advise going forward

## 2017-04-17 ENCOUNTER — Encounter: Payer: Self-pay | Admitting: Cardiology

## 2017-04-23 LAB — CUP PACEART REMOTE DEVICE CHECK
Battery Impedance: 100 Ohm
Battery Remaining Longevity: 146 mo
Battery Voltage: 2.79 V
Brady Statistic AP VP Percent: 0 %
Brady Statistic AS VP Percent: 0 %
Implantable Lead Implant Date: 20180807
Implantable Lead Location: 753859
Implantable Lead Model: 5076
Implantable Pulse Generator Implant Date: 20180807
Lead Channel Impedance Value: 368 Ohm
Lead Channel Pacing Threshold Amplitude: 0.625 V
Lead Channel Pacing Threshold Amplitude: 0.75 V
Lead Channel Setting Pacing Amplitude: 1.5 V
Lead Channel Setting Pacing Amplitude: 2.5 V
Lead Channel Setting Pacing Pulse Width: 0.4 ms
MDC IDC LEAD IMPLANT DT: 20180807
MDC IDC LEAD LOCATION: 753860
MDC IDC MSMT LEADCHNL RA PACING THRESHOLD PULSEWIDTH: 0.4 ms
MDC IDC MSMT LEADCHNL RV IMPEDANCE VALUE: 588 Ohm
MDC IDC MSMT LEADCHNL RV PACING THRESHOLD PULSEWIDTH: 0.4 ms
MDC IDC SESS DTM: 20190211165357
MDC IDC SET LEADCHNL RV SENSING SENSITIVITY: 5.6 mV
MDC IDC STAT BRADY AP VS PERCENT: 87 %
MDC IDC STAT BRADY AS VS PERCENT: 13 %

## 2017-05-01 ENCOUNTER — Telehealth (HOSPITAL_COMMUNITY): Payer: Self-pay

## 2017-05-01 ENCOUNTER — Encounter (HOSPITAL_COMMUNITY): Payer: Self-pay

## 2017-05-01 NOTE — Progress Notes (Signed)
a 

## 2017-05-01 NOTE — Telephone Encounter (Signed)
2nd attempt to call patient in regards to Cardiac Rehab - lm on vm. Sending letter. °

## 2017-05-03 ENCOUNTER — Encounter: Payer: Medicare Other | Admitting: Physician Assistant

## 2017-05-06 ENCOUNTER — Encounter: Payer: Self-pay | Admitting: Internal Medicine

## 2017-05-06 ENCOUNTER — Ambulatory Visit: Payer: Medicare Other | Admitting: Internal Medicine

## 2017-05-06 VITALS — BP 132/80 | HR 85 | Ht 66.0 in | Wt 125.0 lb

## 2017-05-06 DIAGNOSIS — I495 Sick sinus syndrome: Secondary | ICD-10-CM | POA: Diagnosis not present

## 2017-05-06 DIAGNOSIS — I480 Paroxysmal atrial fibrillation: Secondary | ICD-10-CM | POA: Diagnosis not present

## 2017-05-06 DIAGNOSIS — Z952 Presence of prosthetic heart valve: Secondary | ICD-10-CM

## 2017-05-06 DIAGNOSIS — Z950 Presence of cardiac pacemaker: Secondary | ICD-10-CM | POA: Diagnosis not present

## 2017-05-06 LAB — CUP PACEART INCLINIC DEVICE CHECK
Battery Impedance: 100 Ohm
Brady Statistic AP VP Percent: 0 %
Brady Statistic AP VS Percent: 88 %
Date Time Interrogation Session: 20190304161741
Implantable Lead Implant Date: 20180807
Implantable Lead Location: 753859
Implantable Lead Location: 753860
Implantable Lead Model: 5076
Implantable Pulse Generator Implant Date: 20180807
Lead Channel Impedance Value: 545 Ohm
Lead Channel Pacing Threshold Amplitude: 0.5 V
Lead Channel Pacing Threshold Amplitude: 1 V
Lead Channel Pacing Threshold Pulse Width: 0.4 ms
Lead Channel Setting Pacing Amplitude: 1.5 V
Lead Channel Setting Pacing Pulse Width: 0.4 ms
Lead Channel Setting Sensing Sensitivity: 5.6 mV
MDC IDC LEAD IMPLANT DT: 20180807
MDC IDC MSMT BATTERY REMAINING LONGEVITY: 144 mo
MDC IDC MSMT BATTERY VOLTAGE: 2.79 V
MDC IDC MSMT LEADCHNL RA IMPEDANCE VALUE: 339 Ohm
MDC IDC MSMT LEADCHNL RA PACING THRESHOLD AMPLITUDE: 0.625 V
MDC IDC MSMT LEADCHNL RA PACING THRESHOLD PULSEWIDTH: 0.4 ms
MDC IDC MSMT LEADCHNL RA SENSING INTR AMPL: 2 mV
MDC IDC MSMT LEADCHNL RV PACING THRESHOLD AMPLITUDE: 0.875 V
MDC IDC MSMT LEADCHNL RV PACING THRESHOLD PULSEWIDTH: 0.4 ms
MDC IDC MSMT LEADCHNL RV PACING THRESHOLD PULSEWIDTH: 0.4 ms
MDC IDC MSMT LEADCHNL RV SENSING INTR AMPL: 22.4 mV
MDC IDC SET LEADCHNL RV PACING AMPLITUDE: 2.5 V
MDC IDC STAT BRADY AS VP PERCENT: 0 %
MDC IDC STAT BRADY AS VS PERCENT: 12 %

## 2017-05-06 NOTE — Patient Instructions (Addendum)
Medication Instructions:  Your physician recommends that you continue on your current medications as directed. Please refer to the Current Medication list given to you today.  Labwork: None ordered.  Testing/Procedures: None ordered.  Follow-Up: Your physician wants you to follow-up in: one year with Chanetta Marshall, NP.  You will receive a reminder letter in the mail two months in advance. If you don't receive a letter, please call our office to schedule the follow-up appointment.  Remote monitoring is used to monitor your Pacemaker from home. This monitoring reduces the number of office visits required to check your device to one time per year. It allows [place] to keep an eye on the functioning of your device to ensure it is working properly. You are scheduled for a device check from home on 07/15/2017. You may send your transmission at any time that day. If you have a wireless device, the transmission will be sent automatically. After your physician reviews your transmission, you will receive a postcard with your next transmission date.  Any Other Special Instructions Will Be Listed Below (If Applicable).  If you need a refill on your cardiac medications before your next appointment, please call your pharmacy.

## 2017-05-06 NOTE — Progress Notes (Signed)
PCP: [name], MD Primary Cardiologist:  Dr Martinique Primary EP:  Dr Aurea Graff [name] is a 84 y.o. female who presents today for routine electrophysiology followup.  Since last being seen in our clinic, the patient reports doing reasonably well.  She is asked to return today due to [employer] battery advisory.  Today, she denies symptoms of palpitations, chest pain, shortness of breath,  lower extremity edema, dizziness, presyncope, or syncope.  The patient is otherwise without complaint today.   Past Medical History:  Diagnosis Date  . Aortic stenosis, severe   . CKD (chronic kidney disease)   . Complication of anesthesia   . Coronary artery disease    a. 12/20/2016: s/p DES to LAD   . Diverticulosis   . GERD (gastroesophageal reflux disease)   . Hemorrhoids   . Hyperlipidemia   . Hypertension   . Hypothyroidism   . Osteoarthritis   . Paroxysmal atrial fibrillation (HCC)    a. on Arispe with Xarelto but switched to Eliquis after stenting.   . Presence of permanent cardiac pacemaker   . S/P TAVR (transcatheter aortic valve replacement) 01/29/2017   23 mm Edwards Sapien 3 transcatheter heart valve placed via percutaneous right transfemoral approach   . Sick sinus syndrome ([place])    a. s/p PPM (MDT)   Past Surgical History:  Procedure Laterality Date  . ABDOMINAL HYSTERECTOMY  1980  . BASAL CELL CARCINOMA EXCISION    . CARDIAC CATHETERIZATION  ~ 11/2016  . CATARACT EXTRACTION W/ INTRAOCULAR LENS  IMPLANT, BILATERAL Bilateral   . CORONARY ANGIOPLASTY WITH STENT PLACEMENT  12/20/2016   "1 stent"  . CORONARY STENT INTERVENTION N/A 12/20/2016   Procedure: CORONARY STENT INTERVENTION;  Surgeon: Martinique, Peter M, MD;  Location: [place] [facility];  Service: Cardiovascular;  Laterality: N/A;  . FINGER SURGERY Left    "fell; developed skiers thumb; had to operate on it"  . INSERT / REPLACE / [place]   for syptomatic bradycardia and syncope -- in [facility]  .  LEAD REVISION/REPAIR N/A 10/09/2016   New left subclavian MDT Adapta L PPM dual chamber system implanted by Dr [name] with previously placed R subclavian system abandoned  . PACEMAKER GENERATOR CHANGE  2001   pulse generator replacement by Dr. Rollene Fare   . PACEMAKER GENERATOR CHANGE  04/01/2008   PPM Medtronic -- model # [id number] serial # [id number] -- pulse generator replacement by Dr. Verlon Setting   . RIGHT/LEFT HEART CATH AND CORONARY ANGIOGRAPHY N/A 11/27/2016   Procedure: RIGHT/LEFT HEART CATH AND CORONARY ANGIOGRAPHY;  Surgeon: Martinique, Peter M, MD;  Location: [place] [facility];  Service: Cardiovascular;  Laterality: N/A;  . SQUAMOUS CELL CARCINOMA EXCISION    . TEE WITHOUT CARDIOVERSION N/A 01/29/2017   Procedure: TRANSESOPHAGEAL ECHOCARDIOGRAM (TEE);  Surgeon: Sherren Mocha, MD;  Location: [place];  Service: Open Heart Surgery;  Laterality: N/A;  . TONSILLECTOMY    . TRANSCATHETER AORTIC VALVE REPLACEMENT, TRANSFEMORAL N/A 01/29/2017   Procedure: TRANSCATHETER AORTIC VALVE REPLACEMENT, TRANSFEMORAL;  Surgeon: Sherren Mocha, MD;  Location: [place];  Service: Open Heart Surgery;  Laterality: N/A;    ROS- all systems are reviewed and negative except as per HPI above  Current Outpatient Medications  Medication Sig Dispense Refill  . acetaminophen (TYLENOL) 500 MG tablet Take 1,000 mg by mouth every 4 (four) hours as needed for moderate pain or headache.     Marland Kitchen amiodarone (PACERONE) 200 MG tablet Take 0.5 tablets (100 mg total)  by mouth daily. 45 tablet 3  . amLODipine (NORVASC) 10 MG tablet Take 1 tablet (10 mg total) by mouth daily. 90 tablet 3  . apixaban (ELIQUIS) 2.5 MG TABS tablet Take 1 tablet (2.5 mg total) by mouth 2 (two) times daily. 180 tablet 2  . calcium carbonate (OS-CAL) 600 MG TABS Take 600 mg by mouth 2 (two) times daily with a meal.     . clindamycin (CLEOCIN) 300 MG capsule Take 2 tablets by mouth one hour prior to any dental appointments and stomach or bladder procedure. 2  capsule 4  . clopidogrel (PLAVIX) 75 MG tablet Take 1 tablet (75 mg total) by mouth daily. (Patient taking differently: Take 75 mg by mouth every evening. ) 90 tablet 3  . dicyclomine (BENTYL) 10 MG capsule Take 10 mg by mouth 2 (two) times daily as needed for spasms.   1  . furosemide (LASIX) 20 MG tablet Take 1 tablet (20 mg total) by mouth daily. (Patient taking differently: Take 20 mg by mouth daily. Except mondays and fridays, do not take furosemide) 90 tablet 3  . levothyroxine (SYNTHROID, LEVOTHROID) 100 MCG tablet Take 100 mcg by mouth daily before breakfast.     . loratadine (CLARITIN) 10 MG tablet Take 10 mg by mouth daily as needed for allergies.    . metoprolol tartrate (LOPRESSOR) 25 MG tablet Take 0.5 tablets (12.5 mg total) by mouth 2 (two) times daily. 30 tablet 6  . pantoprazole (PROTONIX) 40 MG tablet Take 1 tablet (40 mg total) by mouth daily. 90 tablet 1  . polyvinyl alcohol (LIQUIFILM TEARS) 1.4 % ophthalmic solution Place 1-2 drops into both eyes 3 (three) times daily as needed for dry eyes.    . pravastatin (PRAVACHOL) 20 MG tablet Take 1 tablet (20 mg total) by mouth every evening. 90 tablet 3  . sodium chloride (OCEAN) 0.65 % SOLN nasal spray Place 1 spray into both nostrils as needed for congestion.    . nitroGLYCERIN (NITROSTAT) 0.4 MG SL tablet Place 1 tablet (0.4 mg total) under the tongue every 5 (five) minutes as needed for chest pain. 25 tablet 11   No current facility-administered medications for this visit.     Physical Exam: Vitals:   05/06/17 1055  BP: 132/80  Pulse: 85  Weight: 125 lb (56.7 kg)  Height: 5\' 6"  (1.676 m)    GEN- The patient is well appearing, alert and oriented x 3 today.   Head- normocephalic, atraumatic Eyes-  Sclera clear, conjunctiva pink Ears- hearing intact Oropharynx- clear Lungs- Clear to ausculation bilaterally, normal work of breathing Chest- pacemaker pocket is well healed,  Very thin skin,  Abandoned leads on R chest are  very prominent Heart- Regular rate and rhythm, no murmurs, rubs or gallops, PMI not laterally displaced GI- soft, NT, ND, + BS Extremities- no clubbing, cyanosis, or edema  Pacemaker interrogation- reviewed in detail today,  See PACEART report  ekg tracing ordered today is personally reviewed and shows atrial paced rhythm, PR 290 msec, nonspecific St/T changes  Assessment and Plan:  1. Symptomatic sinus bradycardia  Normal pacemaker function I have spoken with Dannial Monarch ([employer]) who confirms that she is on the battery advisory list.  Today, we discussed this advisory at length with the patient.  Brandon from [employer] also discussed at length with her today.  Given current pacemaker utilization, no programming changes are advised by [employer].  As she is not depending, we will not plan device replacement at this time. See  Pace Art report No changes today  2. Persistent afib  In sinus rhythm with amiodarone 100mg  daily On eliquis 2.5mg  BID Dr Martinique does not appear to wish to stop plavix.  I will defer to him. Dr Martinique to follow LFts/TFTs  3. Severe AS S/p TAVR  4. HTN Stable No change required today  [facility] Return to see EP NP in a year  Thompson Grayer MD, [facility] 05/06/2017 11:00 AM

## 2017-05-08 ENCOUNTER — Telehealth (HOSPITAL_COMMUNITY): Payer: Self-pay

## 2017-05-08 NOTE — Telephone Encounter (Signed)
Patient returned phone call and spoke with Thayer Headings - Patient stated she still is not where she wants to be to participate in CR right now. She would like to hold off for awhile and call us when she is ready. Patient paperwork in filing cabinet.

## 2017-05-16 ENCOUNTER — Telehealth: Payer: Self-pay | Admitting: Cardiology

## 2017-05-16 NOTE — Telephone Encounter (Signed)
New message    Mendel Ryder from [facility] calling to request status of point of care order form from Dr Martinique Please call [phone] ext [phone]

## 2017-05-16 NOTE — Telephone Encounter (Signed)
Returned call to [place] with [facility].Left message on personal voice mail never received point of care order.Advised to refax to fax # [phone].

## 2017-06-16 NOTE — Progress Notes (Signed)
[name] [name] Date of Birth: 1929/12/26 Medical Record #[id number]  History of Present Illness: [name] [name] is seen today for follow up s/p recent PCI with DES to LAD.  She has a history of paroxysmal atrial fibrillation with tachy-brady syndrome and is s/p pacemaker implant. She is on chronic anticoagulation with Xarelto. Echo in July 2015 showed moderate aortic stenosis that had progressed since 2012. Repeat in July 2016 and July 2017 showed no change. In April 2017 she had atrial fibrillation that converted spontaneously. This was felt to be triggered by a steroid injection. Her metoprolol was increased at that time. When seen in June 2017 she was noted to be in persistent Afib. We placed her on [name] but this was later stopped due to side effects. She was seen in November with recurrent and persistent Afib. Rate was controlled but she was symptomatic. Seen in Afib clinic and started on amiodarone with subsequent conversion to NSR. Ecg in January 2018 showed NSR.  When seen last year she complained  of  SOB with activity- this was present even before amiodarone and hasn't improved with restoration of NSR.  She states that any walking or using her arms makes her SOB. No edema.  She does describe a sinking feeling and discomfort  in her epigastrium that makes her feel very weak. She did have a upper and lower EGD which was negative except for few polyps. She had PFTs in December that showed mild obstructive defect with marked decrease in diffusion capacity. CT of the Abdomen was negative for any pathology. She reports Dr. Virgina Jock did an [place] as well. Her last Echo was in April as noted below. Myoview study was normal. When seen by Dr. [name] in late July it was noted that she had failure of her RA lead. Her device was reprogrammed but she developed pectoral stimulation. She underwent explantation of her old generator and placement of a new left chest dual chamber pacemaker on August 7. Because of her  ongoing symptoms we recommended a right and left heart cath and this was done on 11/27/16. This demonstrated severe AS as well as a severe stenosis in the mid LAD. She subsequently underwent successful PCI of the LAD with DES on 12/20/16 with 2.75 x 16 mm Promus stent.   She underwent TAVR on January 29, 2017  with a #23 Edwards Sapien valve. Post op course complicated by pericarditis and AFib with RVR she was treated with anti-inflammatories and Amiodarone. Later Eliquis resumed and ASA stopped. Still on Plavix for stent. Follow up Echo was [place]. She was seen in the pacemaker clinic by Dr. [name] in March. Her batter has an advisory but not plans to replace at this point since she is not pacemaker dependent.   On follow up she is doing well.  She denies any chest pain. She does note that since TAVR her breathing is much better. Notes limitation more with her legs now.  She is followed by [facility]. Did not start Cardiac Rehab. Too many stressors on time at home. Helping care for her sister in law who now has dementia. She did note some cold intolerance and alopecia. Thyroid dose increased. Recent eye exam normal.   Current Outpatient Medications on File Prior to Visit  Medication Sig Dispense Refill  . acetaminophen (TYLENOL) 500 MG tablet Take 1,000 mg by mouth every 4 (four) hours as needed for moderate pain or headache.     Marland Kitchen amiodarone (PACERONE) 200 MG tablet Take 0.5 tablets (  100 mg total) by mouth daily. 45 tablet 3  . amLODipine (NORVASC) 10 MG tablet Take 1 tablet (10 mg total) by mouth daily. 90 tablet 3  . apixaban (ELIQUIS) 2.5 MG TABS tablet Take 1 tablet (2.5 mg total) by mouth 2 (two) times daily. 180 tablet 2  . calcium carbonate (OS-CAL) 600 MG TABS Take 600 mg by mouth 2 (two) times daily with a meal.     . clindamycin (CLEOCIN) 300 MG capsule Take 2 tablets by mouth one hour prior to any dental appointments and stomach or bladder procedure. 2 capsule 4  . clopidogrel (PLAVIX) 75  MG tablet Take 1 tablet (75 mg total) by mouth daily. (Patient taking differently: Take 75 mg by mouth every evening. ) 90 tablet 3  . dicyclomine (BENTYL) 10 MG capsule Take 10 mg by mouth 2 (two) times daily as needed for spasms.   1  . furosemide (LASIX) 20 MG tablet Take 20 mg by mouth.    . levothyroxine (SYNTHROID, LEVOTHROID) 112 MCG tablet Take 112 mcg by mouth daily before breakfast.     . loratadine (CLARITIN) 10 MG tablet Take 10 mg by mouth daily as needed for allergies.    . metoprolol tartrate (LOPRESSOR) 25 MG tablet Take 0.5 tablets (12.5 mg total) by mouth 2 (two) times daily. 30 tablet 6  . nitroGLYCERIN (NITROSTAT) 0.4 MG SL tablet Place 0.4 mg under the tongue every 5 (five) minutes as needed for chest pain.    . pantoprazole (PROTONIX) 40 MG tablet Take 1 tablet (40 mg total) by mouth daily. 90 tablet 1  . polyvinyl alcohol (LIQUIFILM TEARS) 1.4 % ophthalmic solution Place 1-2 drops into both eyes 3 (three) times daily as needed for dry eyes.    . pravastatin (PRAVACHOL) 20 MG tablet Take 1 tablet (20 mg total) by mouth every evening. 90 tablet 3  . sodium chloride (OCEAN) 0.65 % SOLN nasal spray Place 1 spray into both nostrils as needed for congestion.     No current facility-administered medications on file prior to visit.     Allergies  Allergen Reactions  . Penicillins Anaphylaxis and Other (See Comments)    Has patient had a PCN reaction causing immediate rash, facial/tongue/throat swelling, SOB or lightheadedness with hypotension: Yes Has patient had a PCN reaction causing severe rash involving mucus membranes or skin necrosis: No Has patient had a PCN reaction that required hospitalization: No Has patient had a PCN reaction occurring within the last 10 years: No If all of the above answers are "NO", then may proceed with Cephalosporin use.   . Tetanus Toxoid Anaphylaxis  . Demerol Other (See Comments)    Severe nausea  . Codeine Nausea Only and Other (See  Comments)    Severe nausea    Past Medical History:  Diagnosis Date  . Aortic stenosis, severe   . CKD (chronic kidney disease)   . Complication of anesthesia   . Coronary artery disease    a. 12/20/2016: s/p DES to LAD   . Diverticulosis   . GERD (gastroesophageal reflux disease)   . Hemorrhoids   . Hyperlipidemia   . Hypertension   . Hypothyroidism   . Osteoarthritis   . Paroxysmal atrial fibrillation (HCC)    a. on [place] with Xarelto but switched to Eliquis after stenting.   . Presence of permanent cardiac pacemaker   . S/P TAVR (transcatheter aortic valve replacement) 01/29/2017   23 mm Edwards Sapien 3 transcatheter heart valve placed via percutaneous  right transfemoral approach   . Sick sinus syndrome ([place])    a. s/p PPM (MDT)    Past Surgical History:  Procedure Laterality Date  . ABDOMINAL HYSTERECTOMY  1980  . BASAL CELL CARCINOMA EXCISION    . CARDIAC CATHETERIZATION  ~ 11/2016  . CATARACT EXTRACTION W/ INTRAOCULAR LENS  IMPLANT, BILATERAL Bilateral   . CORONARY ANGIOPLASTY WITH STENT PLACEMENT  12/20/2016   "1 stent"  . CORONARY STENT INTERVENTION N/A 12/20/2016   Procedure: CORONARY STENT INTERVENTION;  Surgeon: Martinique, Aniel Hubble M, MD;  Location: [facility];  Service: Cardiovascular;  Laterality: N/A;  . FINGER SURGERY Left    "fell; developed skiers thumb; had to operate on it"  . INSERT / REPLACE / [place]   for syptomatic bradycardia and syncope -- in [facility]  . LEAD REVISION/REPAIR N/A 10/09/2016   New left subclavian MDT Adapta L PPM dual chamber system implanted by Dr [name] with previously placed R subclavian system abandoned  . PACEMAKER GENERATOR CHANGE  2001   pulse generator replacement by Dr. Rollene Fare   . PACEMAKER GENERATOR CHANGE  04/01/2008   PPM Medtronic -- model # [id number] serial # [id number] -- pulse generator replacement by Dr. Verlon Setting   . RIGHT/LEFT HEART CATH AND CORONARY ANGIOGRAPHY N/A 11/27/2016   Procedure:  RIGHT/LEFT HEART CATH AND CORONARY ANGIOGRAPHY;  Surgeon: Martinique, Argel Pablo M, MD;  Location: [facility];  Service: Cardiovascular;  Laterality: N/A;  . SQUAMOUS CELL CARCINOMA EXCISION    . TEE WITHOUT CARDIOVERSION N/A 01/29/2017   Procedure: TRANSESOPHAGEAL ECHOCARDIOGRAM (TEE);  Surgeon: Sherren Mocha, MD;  Location: [facility];  Service: Open Heart Surgery;  Laterality: N/A;  . TONSILLECTOMY    . TRANSCATHETER AORTIC VALVE REPLACEMENT, TRANSFEMORAL N/A 01/29/2017   Procedure: TRANSCATHETER AORTIC VALVE REPLACEMENT, TRANSFEMORAL;  Surgeon: Sherren Mocha, MD;  Location: [facility];  Service: Open Heart Surgery;  Laterality: N/A;    Social History   Tobacco Use  Smoking Status Former Smoker  . Packs/day: 1.50  . Years: 30.00  . Pack years: 45.00  . Types: Cigarettes  . Last attempt to quit: 03/05/1978  . Years since quitting: 39.3  Smokeless Tobacco Never Used    Social History   Substance and Sexual Activity  Alcohol Use No    Family History  Problem Relation Age of Onset  . Cancer Father        lung cancer  . Alzheimer's disease Mother     Review of Systems: As noted in history of present illness.  All other systems were reviewed and are negative.  Physical Exam: BP 131/73   Pulse 86   Ht 5\' 6"  (1.676 m)   Wt 125 lb (56.7 kg)   SpO2 99%   BMI 20.18 kg/m  GENERAL:  Well appearing, elderly WF in NAD HEENT:  PERRL, EOMI, sclera are clear. Oropharynx is clear. NECK:  No jugular venous distention, carotid upstroke brisk and symmetric, no bruits, no thyromegaly or adenopathy LUNGS:  Clear to auscultation bilaterally CHEST:  Unremarkable HEART:  RRR,  PMI not displaced or sustained,S1 and S2 within normal limits, no S3, no S4: no clicks, no rubs, no murmurs ABD:  Soft, nontender. BS +, no masses or bruits. No hepatomegaly, no splenomegaly EXT:  2 + pulses throughout, no edema, no cyanosis no clubbing SKIN:  Warm and dry.  No rashes NEURO:  Alert and oriented x 3.  Cranial nerves II through XII intact. PSYCH:  Cognitively intact  LABORATORY DATA:  Lab Results  Component Value Date   WBC 6.9 02/06/2017   HGB 10.5 (L) 02/06/2017   HCT 32.9 (L) 02/06/2017   PLT 236 02/06/2017   GLUCOSE 92 02/06/2017   ALT 15 01/28/2017   AST 26 01/28/2017   NA 144 02/06/2017   K 3.7 02/06/2017   CL 104 02/06/2017   CREATININE 1.19 (H) 02/06/2017   BUN 14 02/06/2017   CO2 24 02/06/2017   TSH 0.90 06/13/2011   INR 1.23 01/29/2017   HGBA1C 5.5 01/28/2017   Labs dated 07/15/15: cholesterol 229, triglycerides 111, HDL 76, LDL 131.  Dated 07/19/16: cholesterol 247, triglycerides 71, HDL 88, LDL 145. BUN 28, creatinine 1.3. Other chemistries, CBC and TSH normal. Dated 03/15/17: creatinine 1.7, Hgb 11.3. Dated 06/03/17: Hgb 11.6, creatinine 1.6. TSH 6.29.    Echo: 08/18/15 Study Conclusions  - Left ventricle: The cavity size was normal. Wall thickness was  normal. Systolic function was normal. The estimated ejection  fraction was in the range of 55% to 60%. Wall motion was normal;  there were no regional wall motion abnormalities. Features are  consistent with a pseudonormal left ventricular filling pattern,  with concomitant abnormal relaxation and increased filling  pressure (grade 2 diastolic dysfunction). - Aortic valve: There was moderate stenosis. There was mild to  moderate regurgitation directed centrally in the LVOT. - Mitral valve: There was mild regurgitation directed centrally. - Tricuspid valve: There was mild-moderate regurgitation directed  centrally. - Pulmonary arteries: Systolic pressure was mildly increased. PA  peak pressure: 34 mm Hg (S).  Echo 06/20/16: Study Conclusions  - Left ventricle: The cavity size was normal. There was mild   concentric hypertrophy. Systolic function was normal. The   estimated ejection fraction was in the range of 55% to 60%. Wall   motion was normal; there were no regional wall motion    abnormalities. - Aortic valve: Valve mobility was restricted. There was moderate   stenosis. There was moderate regurgitation. Peak velocity (S):   370 cm/s. Mean gradient (S): 30 mm Hg. Regurgitation pressure   half-time: 449 ms. - Mitral valve: Transvalvular velocity was within the normal range.   There was no evidence for stenosis. There was mild regurgitation. - Left atrium: The atrium was moderately dilated. - Right ventricle: The cavity size was normal. Wall thickness was   normal. Systolic function was normal. - Tricuspid valve: There was moderate regurgitation. - Pulmonary arteries: Systolic pressure was within the normal   range. PA peak pressure: 36 mm Hg (S).  Impressions:  - No significant changes since 08/2015.  Echo 12/26/16: Study Conclusions  - Left ventricle: The cavity size was normal. Wall thickness was   normal. Systolic function was normal. The estimated ejection   fraction was in the range of 60% to 65%. Wall motion was normal;   there were no regional wall motion abnormalities. Features are   consistent with a pseudonormal left ventricular filling pattern,   with concomitant abnormal relaxation and increased filling   pressure (grade 2 diastolic dysfunction). - Aortic valve: Moderately to severely calcified annulus.   Moderately thickened, moderately calcified leaflets. There was   moderate stenosis. There was mild regurgitation. Valve area   (VTI): 0.88 cm^2. Valve area (Vmax): 0.78 cm^2. Valve area   (Vmean): 0.74 cm^2. - Mitral valve: Calcified annulus. - Right atrium: The atrium was mildly dilated.  Echo 02/21/17: Study Conclusions  - Left ventricle: Wall thickness was increased in a pattern of mild  LVH. Systolic function was normal. The estimated ejection   fraction was in the range of 55% to 65%. - Aortic valve: Post TAVR with 23 mm Sapien 3 valve. Meand gradient   9 mmHg slightly lower than previous. Leaflets not well visualized   Trivial  peri prosthetic regurgitation and mild ? central AR Valve   area (VTI): 1.54 cm^2. Valve area (Vmax): 1.41 cm^2. Valve area   (Vmean): 1.5 cm^2. - Mitral valve: Thickening and mild diastolic doming of anterior   leaflet Moderate eccentric MR directed posteriorly. There was   moderate regurgitation. - Left atrium: The atrium was mildly dilated. - Atrial septum: No defect or patent foramen ovale was identified. - Tricuspid valve: There was moderate regurgitation. - Pulmonary arteries: PA peak pressure: 32 mm Hg (S).   Myoview 10/03/16: Study Highlights     The left ventricular ejection fraction is normal (55-65%).  Nuclear stress EF: 59%.  There was no ST segment deviation noted during stress.  The study is normal.   1. EF 59%, normal wall motion.  2. No evidence for ischemia or infarction by perfusion images.   Normal study.     Procedures   RIGHT/LEFT HEART CATH AND CORONARY ANGIOGRAPHY  Conclusion     Mid LAD lesion, 30 %stenosed.  Mid LAD to Dist LAD lesion, 85 %stenosed.  Prox RCA to Mid RCA lesion, 20 %stenosed.  Dist RCA lesion, 30 %stenosed.  Ost LM lesion, 10 %stenosed.  LV end diastolic pressure is normal.  LV end diastolic pressure is normal.   1. Single vessel obstructive CAD with 85% mid LAD stenosis 2. Severe aortic stenosis. Mean gradient of 40 mm Hg. AVA 0.61 cm squared with index .37. 3. Normal cardiac output. 4. Normal right heart and LV filling pressures  Plan: will discuss options for AVR. I think her best option based on age and co-morbidities would be TAVR with stenting of mid LAD. Will discuss with heart valve team.   Indications   Nonrheumatic aortic valve stenosis [I35.0 (ICD-10-CM)]  Procedural Details/Technique   Technical Details Indication: 84 yo WF with progressive dyspnea on exertion and aortic stenosis  Procedural Details: The right wrist was prepped, draped, and anesthetized with 1% lidocaine. Using the modified  Seldinger technique a 6 Fr slender sheath was placed in the right radial artery and a 5 French sheath was placed in the right brachial vein. We were unable to pass the Swan- Ganz catheter from the right brachial approach due to inability to pass catheter past the old pacing wires. The right groin was prepped, draped and anesthetized with 1% lidocaine. A 7 Fr sheath was placed percutaneously in the right femoral vein. A Swan-Ganz catheter was used for the right heart catheterization. Standard protocol was followed for recording of right heart pressures and sampling of oxygen saturations. Fick cardiac output was calculated. Standard Judkins catheters were used for selective coronary angiography and left ventricular pressures. There were no immediate procedural complications. The patient was transferred to the post catheterization recovery area for further monitoring.  Contrast: 55 cc   Estimated blood loss <50 mL.  During this procedure the patient was administered the following to achieve and maintain moderate conscious sedation: Versed 1 mg, Fentanyl 25 mcg, while the patient's heart rate, blood pressure, and oxygen saturation were continuously monitored. The period of conscious sedation was 50 minutes, of which I was present face-to-face 100% of this time.    Complications   Complications documented before study signed (11/27/2016 11:04 AM EDT)  No complications were associated with this study.  Documented by Martinique, Demetris Capell M, MD - 11/27/2016 11:04 AM EDT    Coronary Findings   Dominance: Right  Left Main  Ost LM lesion, 10% stenosed. The lesion is moderately calcified.  Left Anterior Descending  Mid LAD lesion, 30% stenosed. The lesion is moderately calcified.  Mid LAD to Dist LAD lesion, 85% stenosed.  Left Circumflex  Vessel is small.  Right Coronary Artery  Prox RCA to Mid RCA lesion, 20% stenosed.  Dist RCA lesion, 30% stenosed.  Right Heart   Right Heart Pressures LV EDP is  normal.    Left Heart   Left Ventricle LV end diastolic pressure is normal.    Coronary Diagrams   Diagnostic Diagram       Implants     No implant documentation for this case.  MERGE Images   Show images for CARDIAC CATHETERIZATION   Link to Procedure Log   Procedure Log    Hemo Data    Most Recent Value  Fick Cardiac Output 3.62 L/min  Fick Cardiac Output Index 2.22 (L/min)/BSA  Aortic Mean Gradient 39.6 mmHg  Aortic Peak Gradient 38 mmHg  Aortic Valve Area 0.61  Aortic Value Area Index 0.37 cm2/BSA  RA A Wave 9 mmHg  RA V Wave 9 mmHg  RA Mean 6 mmHg  RV Systolic Pressure 34 mmHg  RV Diastolic Pressure 3 mmHg  RV EDP 10 mmHg  PA Systolic Pressure 32 mmHg  PA Diastolic Pressure 11 mmHg  PA Mean 20 mmHg  PW A Wave 18 mmHg  PW V Wave 23 mmHg  PW Mean 16 mmHg  AO Systolic Pressure 034 mmHg  AO Diastolic Pressure 71 mmHg  AO Mean 742 mmHg  LV Systolic Pressure 595 mmHg  LV Diastolic Pressure 5 mmHg  LV EDP 21 mmHg  Arterial Occlusion Pressure Extended Systolic Pressure 638 mmHg  Arterial Occlusion Pressure Extended Diastolic Pressure 67 mmHg  Arterial Occlusion Pressure Extended Mean Pressure 103 mmHg  Left Ventricular Apex Extended Systolic Pressure 756 mmHg  Left Ventricular Apex Extended Diastolic Pressure 3 mmHg  Left Ventricular Apex Extended EDP Pressure 22 mmHg  QP/QS 1  TPVR Index 9 HRUI  TSVR Index 48.14 HRUI  PVR SVR Ratio 0.04  TPVR/TSVR Ratio 0.19   Stent procedure: Conclusion     Mid LAD to Dist LAD lesion, 85 %stenosed.  A STENT PROMUS PREM MR 2.75X16 drug eluting stent was successfully placed.  Post intervention, there is a 0% residual stenosis.   1. Successful stenting of the mid LAD with DES      Assessment / Plan: 1. Atrial fibrillation with sick sinus syndrome. Prior history of  persistent AFib.  Now on amiodarone since 2017 and converted to NSR.  Continue long-term anticoagulation. On  Eliquis 2.5 mg bid.  Dose adjusted  for age and renal function. S/p pacemaker replacement for failed RA lead. She did have some recurrent Afib at time of TAVR but now back in NSR. Will continue amiodarone 100 mg daily  2. Aortic stenosis-  Now s/p TAVR. Making a good recovery. Follow up Echo and exam OK. Breathing is much better.  3. CAD with significant single vessel obstructive disease in the mid LAD. S/p PCI of the LAD with DES.  On  Plavix and Eliquis. No angina. Plan to continue Plavix for one year from stent - so 12/20/17.   4. HTN- controlled.  5. Hyperlipidemia. On  Crestor 10 mg daily.  6. Chronic diastolic  CHF. She is well compensated.   I did encourage her to start Cardiac Rehab for conditioning but she is not ready yet.   Follow up in 4 months.

## 2017-06-18 ENCOUNTER — Encounter: Payer: Self-pay | Admitting: Cardiology

## 2017-06-18 ENCOUNTER — Ambulatory Visit: Payer: Medicare Other | Admitting: Cardiology

## 2017-06-18 VITALS — BP 131/73 | HR 86 | Ht 66.0 in | Wt 125.0 lb

## 2017-06-18 DIAGNOSIS — Z952 Presence of prosthetic heart valve: Secondary | ICD-10-CM

## 2017-06-18 DIAGNOSIS — I350 Nonrheumatic aortic (valve) stenosis: Secondary | ICD-10-CM | POA: Diagnosis not present

## 2017-06-18 DIAGNOSIS — I5032 Chronic diastolic (congestive) heart failure: Secondary | ICD-10-CM

## 2017-06-18 DIAGNOSIS — I495 Sick sinus syndrome: Secondary | ICD-10-CM

## 2017-06-18 DIAGNOSIS — I2510 Atherosclerotic heart disease of native coronary artery without angina pectoris: Secondary | ICD-10-CM | POA: Diagnosis not present

## 2017-06-18 DIAGNOSIS — I480 Paroxysmal atrial fibrillation: Secondary | ICD-10-CM

## 2017-06-18 NOTE — Patient Instructions (Addendum)
Continue your current therapy  I will see you in 4 months  

## 2017-07-11 ENCOUNTER — Telehealth: Payer: Self-pay | Admitting: Cardiology

## 2017-07-11 DIAGNOSIS — E785 Hyperlipidemia, unspecified: Secondary | ICD-10-CM

## 2017-07-11 DIAGNOSIS — I2510 Atherosclerotic heart disease of native coronary artery without angina pectoris: Secondary | ICD-10-CM

## 2017-07-11 NOTE — Telephone Encounter (Signed)
New Message:     Pt c/o medication issue:  1. Name of Medication: pravastatin (PRAVACHOL) 20 MG tablet  2. How are you currently taking this medication (dosage and times per day)? Take 1 tablet (20 mg total) by mouth every evening.  3. Are you having a reaction (difficulty breathing--STAT)? No  4. What is your medication issue? Pt is stating she is having the same reaction and before.

## 2017-07-11 NOTE — Telephone Encounter (Signed)
Left message for pt to call.

## 2017-07-12 NOTE — Telephone Encounter (Signed)
New Message:      Pt is returning a call from [place] on yesterday about the med issue she is having.

## 2017-07-12 NOTE — Telephone Encounter (Signed)
Returned call to patient.She stated since she restarted Pravachol she is having weakness in legs.Stated both legs ache like before.She takes 20 mg every night.Advised I will send message to [place] for advice.

## 2017-07-13 NOTE — Telephone Encounter (Signed)
Looks like we had her listed taking Crestor rather than pravachol on last visit. Either way I would stop taking pravachol at this point. We could try her on Zetia 10 mg daily instead and repeat lab in 3 months.  Peter Martinique MD, [facility]

## 2017-07-15 ENCOUNTER — Ambulatory Visit (INDEPENDENT_AMBULATORY_CARE_PROVIDER_SITE_OTHER): Payer: Medicare Other | Admitting: *Deleted

## 2017-07-15 DIAGNOSIS — I495 Sick sinus syndrome: Secondary | ICD-10-CM | POA: Diagnosis not present

## 2017-07-15 NOTE — Progress Notes (Signed)
Remote pacemaker transmission.   

## 2017-07-16 ENCOUNTER — Other Ambulatory Visit: Payer: Self-pay

## 2017-07-16 DIAGNOSIS — I2510 Atherosclerotic heart disease of native coronary artery without angina pectoris: Secondary | ICD-10-CM

## 2017-07-16 DIAGNOSIS — E785 Hyperlipidemia, unspecified: Secondary | ICD-10-CM

## 2017-07-16 LAB — CUP PACEART REMOTE DEVICE CHECK
Battery Remaining Longevity: 140 mo
Brady Statistic AP VP Percent: 0 %
Implantable Lead Implant Date: 20180807
Implantable Lead Location: 753859
Implantable Lead Location: 753860
Implantable Lead Model: 5076
Implantable Lead Model: 5076
Lead Channel Pacing Threshold Amplitude: 0.625 V
Lead Channel Pacing Threshold Amplitude: 0.75 V
Lead Channel Setting Pacing Amplitude: 1.5 V
Lead Channel Setting Pacing Amplitude: 2.5 V
Lead Channel Setting Pacing Pulse Width: 0.4 ms
MDC IDC LEAD IMPLANT DT: 20180807
MDC IDC MSMT BATTERY IMPEDANCE: 112 Ohm
MDC IDC MSMT BATTERY VOLTAGE: 2.79 V
MDC IDC MSMT LEADCHNL RA IMPEDANCE VALUE: 350 Ohm
MDC IDC MSMT LEADCHNL RA PACING THRESHOLD PULSEWIDTH: 0.4 ms
MDC IDC MSMT LEADCHNL RV IMPEDANCE VALUE: 548 Ohm
MDC IDC MSMT LEADCHNL RV PACING THRESHOLD PULSEWIDTH: 0.4 ms
MDC IDC MSMT LEADCHNL RV SENSING INTR AMPL: 16 mV
MDC IDC PG IMPLANT DT: 20180807
MDC IDC SESS DTM: 20190513124756
MDC IDC SET LEADCHNL RV SENSING SENSITIVITY: 5.6 mV
MDC IDC STAT BRADY AP VS PERCENT: 93 %
MDC IDC STAT BRADY AS VP PERCENT: 0 %
MDC IDC STAT BRADY AS VS PERCENT: 7 %

## 2017-07-16 MED ORDER — EZETIMIBE 10 MG PO TABS: 10.0000 mg | ORAL_TABLET | Freq: Every day | ORAL | 6 refills | Status: DC

## 2017-07-16 NOTE — Telephone Encounter (Signed)
Returned call to patient [name]'s recommendations given.Repeat fasting lipid panel in 3 months.Lab order mailed.

## 2017-07-16 NOTE — Telephone Encounter (Signed)
Follow Up:   Pt calling back to find out what Dr Martinique said about the Pravachol.

## 2017-07-17 ENCOUNTER — Encounter: Payer: Self-pay | Admitting: Cardiology

## 2017-07-22 ENCOUNTER — Telehealth: Payer: Self-pay | Admitting: Cardiology

## 2017-07-22 NOTE — Telephone Encounter (Signed)
Let her see primary care tomorrow to assess what is going on before we blame it on the statin  Hykeem Ojeda Martinique MD, [facility]

## 2017-07-22 NOTE — Telephone Encounter (Signed)
Returned call to [employer] with [facility] (palliative care)  She spoke with patient who noted the following:  Thursday - patient reported leg weakness, like "watery" legs  - started on statin on 5/14 Sunday night - N/V and low grade temp - plans to see PCP tomorrow  Concerned about statin therapy (in regards to legs) as she has had issues in the past  Will route to MD

## 2017-07-22 NOTE — Telephone Encounter (Signed)
New Message:   Please call,she wanted to talk to the nurse.We were disconnected.

## 2017-07-23 ENCOUNTER — Telehealth: Payer: Self-pay | Admitting: Cardiology

## 2017-07-23 NOTE — Telephone Encounter (Signed)
New Message   Pt is returning call for cheryl. Please call

## 2017-07-23 NOTE — Telephone Encounter (Signed)
Returned call to patient.She stated she continues to have weakness in both legs after taking Zetia.Stated she saw PCP this morning for a stomach virus.He thinks leg weakness is from stomach virus.Stated she has had leg weakness a long time.Advised I will speak to [name] and call you back.

## 2017-07-23 NOTE — Telephone Encounter (Signed)
Returned call to [employer] with [facility] left message on personal voice mail [name]'s recommendation. Called patient left message on personal voice mail [name]'s recommendation.Advised to call back if needed.

## 2017-07-24 NOTE — Telephone Encounter (Signed)
Returned call to patient [name] advised to stop taking Zetia.Advised to call back if continues to have weakness in legs.

## 2017-07-24 NOTE — Telephone Encounter (Signed)
Spoke to patient [name] advised to stop Zetia.Advised to call back if continues to have leg weakness.

## 2017-08-05 ENCOUNTER — Other Ambulatory Visit: Payer: Self-pay | Admitting: *Deleted

## 2017-08-05 ENCOUNTER — Other Ambulatory Visit: Payer: Self-pay | Admitting: Physician Assistant

## 2017-08-05 MED ORDER — PANTOPRAZOLE SODIUM 40 MG PO TBEC: 40.0000 mg | DELAYED_RELEASE_TABLET | Freq: Every day | ORAL | 3 refills | Status: DC

## 2017-08-12 ENCOUNTER — Other Ambulatory Visit (HOSPITAL_COMMUNITY): Payer: Self-pay | Admitting: Nurse Practitioner

## 2017-08-14 ENCOUNTER — Other Ambulatory Visit: Payer: Self-pay

## 2017-08-14 MED ORDER — METOPROLOL TARTRATE 25 MG PO TABS: 12.5000 mg | ORAL_TABLET | Freq: Two times a day (BID) | ORAL | 6 refills | Status: DC

## 2017-08-19 ENCOUNTER — Telehealth: Payer: Self-pay | Admitting: Cardiology

## 2017-08-19 NOTE — Telephone Encounter (Signed)
Pt is calling   Pt stated she isn't feeling good with leg pain. Pt need to speak to nurse and wouldn't go into details what was wrong with her. She wanted to speak to [facility] or Dr Martinique because they would know what was she was talking about.

## 2017-08-19 NOTE — Telephone Encounter (Signed)
Spoke with pt, aware dr Martinique and cheryl are out of the office. She reports weakness and swelling in her legs. She also reports a cramping type pain occ in her chest. These symptoms have been going on for about 1 week now. She would like to be seen. Follow up scheduled with APP this week.

## 2017-08-22 ENCOUNTER — Encounter: Payer: Self-pay | Admitting: Physician Assistant

## 2017-08-22 ENCOUNTER — Ambulatory Visit: Payer: Medicare Other | Admitting: Physician Assistant

## 2017-08-22 VITALS — BP 138/78 | HR 87 | Ht 66.0 in | Wt 125.0 lb

## 2017-08-22 DIAGNOSIS — I340 Nonrheumatic mitral (valve) insufficiency: Secondary | ICD-10-CM

## 2017-08-22 DIAGNOSIS — R531 Weakness: Secondary | ICD-10-CM

## 2017-08-22 DIAGNOSIS — I5032 Chronic diastolic (congestive) heart failure: Secondary | ICD-10-CM

## 2017-08-22 DIAGNOSIS — Z952 Presence of prosthetic heart valve: Secondary | ICD-10-CM

## 2017-08-22 DIAGNOSIS — R079 Chest pain, unspecified: Secondary | ICD-10-CM

## 2017-08-22 NOTE — Progress Notes (Signed)
Cardiology Office Note   Date:  08/22/2017   ID:  Foy, Mungia 1929/05/10, MRN [id number]  PCP:  [name], MD  Cardiologist: Dr. Martinique, 06/18/2017 Electrophysiologist: Dr [name] Rosaria Ferries, PA-C   Chief Complaint  Patient presents with  . Follow-up    History of Present Illness: [name] [name] is a 84 y.o. female with a history of DES LAD 11/2016, PAF in SR on amio and Eliquis, CKD III, nl MV, MDT PPM, TAVR 01/2017, hypothyroid, skin CA s/p multiple procedures.  6/17 phone note regarding leg pain, appointment made  [name] [name] presents for cardiology follow up.  She has grabbing pain in her upper L chest. It will come and then go away in a few seconds. 5/10 and it hurts to breathe when she gets it. No N&V or diaphoresis. She does not get it every day, but gets it multiple times on the days that she gets it, she has "spells" of it. She has not had it today. She wonders if this is from her stomach.   She has swelling in her legs, feet and abdomen. She wakes with a little RLE edema, it progresses during the day and she gets LLE edema as well. She feels her abdomen is tight by bedtime as well. She does not wake w/ LLE or abdominal edema.   She does not feel her heart skip or race.   She will get spells about 2 x week where she feels tired and extremely weak.   She will wake up feeling fine, then it will hit hard. Her legs will feel extremely weak, arms not so much. She will feel that she cannot walk 50 feet, unusual for her. She has to rest and lie down. She will feel some better the next day but it takes another day to completely get over it. Fees like it starts at rest sometimes, but the episodes she describes are after exertion. She has never checked her VS during this.  She feels that her legs are weak all the time. She has never had PT eval. She never did cardiac rehab after her TAVR and just feels she has continued to get weaker.   She does not get exertional  chest pain.    Past Medical History:  Diagnosis Date  . Aortic stenosis, severe   . CKD (chronic kidney disease)   . Complication of anesthesia   . Coronary artery disease    a. 12/20/2016: s/p DES to LAD   . Diverticulosis   . GERD (gastroesophageal reflux disease)   . Hemorrhoids   . Hyperlipidemia   . Hypertension   . Hypothyroidism   . Osteoarthritis   . Paroxysmal atrial fibrillation (HCC)    a. on [place] with Xarelto but switched to Eliquis after stenting.   . Presence of permanent cardiac pacemaker   . S/P TAVR (transcatheter aortic valve replacement) 01/29/2017   23 mm Edwards Sapien 3 transcatheter heart valve placed via percutaneous right transfemoral approach   . Sick sinus syndrome ([place])    a. s/p PPM (MDT)    Past Surgical History:  Procedure Laterality Date  . ABDOMINAL HYSTERECTOMY  1980  . BASAL CELL CARCINOMA EXCISION    . CARDIAC CATHETERIZATION  ~ 11/2016  . CATARACT EXTRACTION W/ INTRAOCULAR LENS  IMPLANT, BILATERAL Bilateral   . CORONARY ANGIOPLASTY WITH STENT PLACEMENT  12/20/2016   "1 stent"  . CORONARY STENT INTERVENTION N/A 12/20/2016   Procedure: CORONARY STENT INTERVENTION;  Surgeon:  Martinique, Peter M, MD;  Location: [place] [facility];  Service: Cardiovascular;  Laterality: N/A;  . FINGER SURGERY Left    "fell; developed skiers thumb; had to operate on it"  . INSERT / REPLACE / [place]   for syptomatic bradycardia and syncope -- in [facility]  . LEAD REVISION/REPAIR N/A 10/09/2016   New left subclavian MDT Adapta L PPM dual chamber system implanted by Dr [name] with previously placed R subclavian system abandoned  . PACEMAKER GENERATOR CHANGE  2001   pulse generator replacement by Dr. Rollene Fare   . PACEMAKER GENERATOR CHANGE  04/01/2008   PPM Medtronic -- model # [id number] serial # [id number] -- pulse generator replacement by Dr. Verlon Setting   . RIGHT/LEFT HEART CATH AND CORONARY ANGIOGRAPHY N/A 11/27/2016   Procedure: RIGHT/LEFT HEART CATH  AND CORONARY ANGIOGRAPHY;  Surgeon: Martinique, Peter M, MD;  Location: [facility];  Service: Cardiovascular;  Laterality: N/A;  . SQUAMOUS CELL CARCINOMA EXCISION    . TEE WITHOUT CARDIOVERSION N/A 01/29/2017   Procedure: TRANSESOPHAGEAL ECHOCARDIOGRAM (TEE);  Surgeon: Sherren Mocha, MD;  Location: [place];  Service: Open Heart Surgery;  Laterality: N/A;  . TONSILLECTOMY    . TRANSCATHETER AORTIC VALVE REPLACEMENT, TRANSFEMORAL N/A 01/29/2017   Procedure: TRANSCATHETER AORTIC VALVE REPLACEMENT, TRANSFEMORAL;  Surgeon: Sherren Mocha, MD;  Location: [place] [facility];  Service: Open Heart Surgery;  Laterality: N/A;    Current Outpatient Medications  Medication Sig Dispense Refill  . acetaminophen (TYLENOL) 500 MG tablet Take 1,000 mg by mouth every 4 (four) hours as needed for moderate pain or headache.     Marland Kitchen amiodarone (PACERONE) 200 MG tablet Take 0.5 tablets (100 mg total) by mouth daily. 45 tablet 3  . amLODipine (NORVASC) 10 MG tablet Take 1 tablet (10 mg total) by mouth daily. 90 tablet 3  . apixaban (ELIQUIS) 2.5 MG TABS tablet Take 1 tablet (2.5 mg total) by mouth 2 (two) times daily. 180 tablet 2  . calcium carbonate (OS-CAL) 600 MG TABS Take 600 mg by mouth 2 (two) times daily with a meal.     . clindamycin (CLEOCIN) 300 MG capsule Take 2 tablets by mouth one hour prior to any dental appointments and stomach or bladder procedure. 2 capsule 4  . clopidogrel (PLAVIX) 75 MG tablet Take 1 tablet (75 mg total) by mouth daily. (Patient taking differently: Take 75 mg by mouth every evening. ) 90 tablet 3  . dicyclomine (BENTYL) 10 MG capsule Take 10 mg by mouth 2 (two) times daily as needed for spasms.   1  . furosemide (LASIX) 20 MG tablet Take 20 mg by mouth.    . furosemide (LASIX) 20 MG tablet TAKE 1 TABLET(20 MG) BY MOUTH DAILY 30 tablet 0  . levothyroxine (SYNTHROID, LEVOTHROID) 112 MCG tablet Take 112 mcg by mouth daily before breakfast.     . loratadine (CLARITIN) 10 MG tablet Take 10 mg by  mouth daily as needed for allergies.    . metoprolol tartrate (LOPRESSOR) 25 MG tablet Take 0.5 tablets (12.5 mg total) by mouth 2 (two) times daily. 30 tablet 6  . nitroGLYCERIN (NITROSTAT) 0.4 MG SL tablet Place 0.4 mg under the tongue every 5 (five) minutes as needed for chest pain.    . pantoprazole (PROTONIX) 40 MG tablet Take 1 tablet (40 mg total) by mouth daily. 90 tablet 3  . polyvinyl alcohol (LIQUIFILM TEARS) 1.4 % ophthalmic solution Place 1-2 drops into both eyes 3 (three) times daily as needed  for dry eyes.    . sodium chloride (OCEAN) 0.65 % SOLN nasal spray Place 1 spray into both nostrils as needed for congestion.     No current facility-administered medications for this visit.     Allergies:   Penicillins; Tetanus toxoid; Demerol; and Codeine    Social History:  The patient  reports that she quit smoking about 39 years ago. Her smoking use included cigarettes. She has a 45.00 pack-year smoking history. She has never used smokeless tobacco. She reports that she does not drink alcohol or use drugs.   Family History:  The patient's family history includes Alzheimer's disease in her mother; Cancer in her father.    ROS:  Please see the history of present illness. All other systems are reviewed and negative.    PHYSICAL EXAM: VS:  BP 138/78   Pulse 87   Ht 5\' 6"  (1.676 m)   Wt 125 lb (56.7 kg)   BMI 20.18 kg/m  , BMI Body mass index is 20.18 kg/m. GEN: Well nourished, well developed, female in no acute distress  HEENT: normal for age  Neck: no JVD, no carotid bruit, no masses Cardiac: RRR; 2/6 murmur, no rubs, or gallops Respiratory:  clear to auscultation bilaterally, normal work of breathing GI: soft, nontender, nondistended, + BS MS: no deformity or atrophy; no edema; distal pulses are 2+ in all 4 extremities   Skin: warm and dry, no rash Neuro:  Strength and sensation are intact Psych: euthymic mood, full affect   EKG:  EKG is ordered today. The ekg ordered  today demonstrates atrial pacing with ventricular sensing and a heart rate of 87  ECHO: 02/21/2017 - Left ventricle: Wall thickness was increased in a pattern of mild   LVH. Systolic function was normal. The estimated ejection   fraction was in the range of 55% to 65%. - Aortic valve: Post TAVR with 23 mm Sapien 3 valve. Meand gradient   9 mmHg slightly lower than previous. Leaflets not well visualized   Trivial peri prosthetic regurgitation and mild ? central AR Valve   area (VTI): 1.54 cm^2. Valve area (Vmax): 1.41 cm^2. Valve area   (Vmean): 1.5 cm^2. - Mitral valve: Thickening and mild diastolic doming of anterior   leaflet Moderate eccentric MR directed posteriorly. There was   moderate regurgitation. - Left atrium: The atrium was mildly dilated.  - Atrial septum: No defect or patent foramen ovale was identified. - Tricuspid valve: There was moderate regurgitation. - Pulmonary arteries: PA peak pressure: 32 mm Hg (S).   Recent Labs: 01/28/2017: ALT 15; B Natriuretic Peptide 522.0 01/30/2017: Magnesium 1.8 02/06/2017: BUN 14; Creatinine, Ser 1.19; Hemoglobin 10.5; Platelets 236; Potassium 3.7; Sodium 144    Lipid Panel No results found for: CHOL, TRIG, HDL, CHOLHDL, VLDL, LDLCALC, LDLDIRECT   Wt Readings from Last 3 Encounters:  08/22/17 125 lb (56.7 kg)  06/18/17 125 lb (56.7 kg)  05/06/17 125 lb (56.7 kg)     Other studies Reviewed: Additional studies/ records that were reviewed today include: Office notes and testing.  ASSESSMENT AND PLAN:  1.  S/p TAVR: Her echo was checked in December.  Mean gradient was 9 mmHg.  She is not having consistent dyspnea on exertion.  2.  Chronic diastolic CHF: She does not have any significant lower extremity edema on exam.  Her weight is unchanged.  3.  Weakness: The episodes are prolonged, lasting over 24 hours.  I do not have a clear-cut cause based on the history.  She is encouraged to keep a symptom diary and check her vital  signs during these episodes. -She describes exertional episodes, but says they happen without exertion as well. -She never did cardiac rehab after her TAVR and I am concerned that deconditioning is significant. -We will make a PT referral.  4.  Chest pain: Her symptoms are very atypical and more consistent with some kind of spasm, possibly esophageal.  We discussed the situation and she is in agreement that we will hold off on testing at this time.  5. Moderate MR: I do not feel this is causing her symptoms, but it will need to be followed.   Current medicines are reviewed at length with the patient today.  The patient does not have concerns regarding medicines.  The following changes have been made:  no change  Labs/ tests ordered today include:   Orders Placed This Encounter  Procedures  . Ambulatory referral to Physical Therapy  . EKG 12-Lead     Disposition:   FU with Dr. Martinique  Signed, Rosaria Ferries, PA-C  08/22/2017 6:21 PM    [place] Phone: [phone]; Fax: [phone]  This note was written with the assistance of speech recognition software. Please excuse any transcriptional errors.

## 2017-08-22 NOTE — Patient Instructions (Signed)
Medication Instructions: Your physician recommends that you continue on your current medications as directed.    If you need a refill on your cardiac medications before your next appointment, please call your pharmacy.     Follow-Up: Your physician wants you to keep your scheduled appointment with Dr. Martinique.  Physical Therapy referral.   Special Instructions: Keep a log of your symptoms (Weakness/Chest Pain)      Thank you for choosing [facility] at [facility]!!

## 2017-10-08 ENCOUNTER — Telehealth: Payer: Self-pay

## 2017-10-08 DIAGNOSIS — Z952 Presence of prosthetic heart valve: Secondary | ICD-10-CM

## 2017-10-08 DIAGNOSIS — I350 Nonrheumatic aortic (valve) stenosis: Secondary | ICD-10-CM

## 2017-10-08 MED ORDER — FUROSEMIDE 20 MG PO TABS: ORAL_TABLET | ORAL | 2 refills | Status: DC

## 2017-10-08 NOTE — Telephone Encounter (Signed)
The pt returned my call to schedule her 1 year TAVR f/u appointments. The pt also asked for assistance in getting her Furosemide refilled.  The pt states she is currently taking Furosemide 20mg  daily except on Monday and Friday.  I reviewed the pt's chart and she was instructed by Dr Martinique at 03/18/17 OV to decrease her Furosemide to 5 days per week. I will update her medication list and send in an updated Rx.   Per Dr Doug Sou OV: 6. Chronic diastolic CHF. She is well compensated. Given increase in creatinine with daily lasix will reduce to 5x/wk.

## 2017-10-14 ENCOUNTER — Ambulatory Visit (INDEPENDENT_AMBULATORY_CARE_PROVIDER_SITE_OTHER): Payer: Medicare Other | Admitting: *Deleted

## 2017-10-14 DIAGNOSIS — I495 Sick sinus syndrome: Secondary | ICD-10-CM | POA: Diagnosis not present

## 2017-10-14 NOTE — Progress Notes (Signed)
Remote pacemaker transmission.   

## 2017-10-15 ENCOUNTER — Encounter: Payer: Self-pay | Admitting: Cardiology

## 2017-10-27 NOTE — Progress Notes (Signed)
[name] [name] Date of Birth: [birth year] Medical Record #[id number]  History of Present Illness: [name] [name] is seen today for follow up CAD and AV stenosis. She is s/p  PCI with DES to LAD on December 20, 2016.   She has a history of paroxysmal atrial fibrillation with tachy-brady syndrome and is s/p pacemaker implant. She is on chronic anticoagulation with Eliquis. Echo in July 2015 showed moderate aortic stenosis that had progressed since 2012. Repeat in July 2016 and July 2017 showed no change. In April 2017 she had atrial fibrillation that converted spontaneously. This was felt to be triggered by a steroid injection. Her metoprolol was increased at that time. When seen in June 2017 she was noted to be in persistent Afib. We placed her on Flecainide but this was later stopped due to side effects. She was seen in November with recurrent and persistent Afib. Rate was controlled but she was symptomatic. Seen in Afib clinic and started on amiodarone with subsequent conversion to NSR.   When last see she complained  of  SOB with activity- this was present even before amiodarone and hasn't improved with restoration of NSR.   She did have a upper and lower EGD which was negative except for few polyps. She had PFTs in December that showed mild obstructive defect with marked decrease in diffusion capacity. CT of the Abdomen was negative for any pathology. She reports Dr. Virgina Jock did an [place] as well.  Myoview study was normal. When seen by Dr. [name] in late July it was noted that she had failure of her RA lead. Her device was reprogrammed but she developed pectoral stimulation. She underwent explantation of her old generator and placement of a new left chest dual chamber pacemaker on October 09 2016. Because of her ongoing symptoms we recommended a right and left heart cath and this was done on 11/27/16. This demonstrated severe AS as well as a severe stenosis in the mid LAD. She subsequently underwent successful PCI of  the LAD with DES on 12/20/16 with 2.75 x 16 mm Promus stent.   She underwent TAVR on November 27 with a #23 Edwards Sapien valve. Post op course complicated by pericarditis and AFib with RVR she was treated with anti-inflammatories and Amiodarone. Later Eliquis resumed and ASA stopped. Still on Plavix for stent. Follow up Echo was [place]. She was seen by Rosaria Ferries PA-C in June with persistent complaints of periodic weakness.   On follow up she is doing better. Notes leg weakness has improved since June. SOB is better. No chest pain. No dizziness or syncope. Unable to tolerate even low dose statin due to myalgias.   Current Outpatient Medications on File Prior to Visit  Medication Sig Dispense Refill  . acetaminophen (TYLENOL) 500 MG tablet Take 1,000 mg by mouth every 4 (four) hours as needed for moderate pain or headache.     Marland Kitchen amiodarone (PACERONE) 200 MG tablet Take 0.5 tablets (100 mg total) by mouth daily. 45 tablet 3  . apixaban (ELIQUIS) 2.5 MG TABS tablet Take 1 tablet (2.5 mg total) by mouth 2 (two) times daily. 180 tablet 2  . calcium carbonate (OS-CAL) 600 MG TABS Take 600 mg by mouth 2 (two) times daily with a meal.     . clindamycin (CLEOCIN) 300 MG capsule Take 2 tablets by mouth one hour prior to any dental appointments and stomach or bladder procedure. 2 capsule 4  . clopidogrel (PLAVIX) 75 MG tablet Take 1 tablet (75 mg  total) by mouth daily. (Patient taking differently: Take 75 mg by mouth every evening. ) 90 tablet 3  . dicyclomine (BENTYL) 10 MG capsule Take 10 mg by mouth 2 (two) times daily as needed for spasms.   1  . furosemide (LASIX) 20 MG tablet Take one tablet by mouth daily except on Monday and Friday 90 tablet 2  . levothyroxine (SYNTHROID, LEVOTHROID) 112 MCG tablet Take 112 mcg by mouth daily before breakfast.     . loratadine (CLARITIN) 10 MG tablet Take 10 mg by mouth daily as needed for allergies.    . metoprolol tartrate (LOPRESSOR) 25 MG tablet Take 0.5 tablets  (12.5 mg total) by mouth 2 (two) times daily. 30 tablet 6  . nitroGLYCERIN (NITROSTAT) 0.4 MG SL tablet Place 0.4 mg under the tongue every 5 (five) minutes as needed for chest pain.    . pantoprazole (PROTONIX) 40 MG tablet Take 1 tablet (40 mg total) by mouth daily. 90 tablet 3  . polyvinyl alcohol (LIQUIFILM TEARS) 1.4 % ophthalmic solution Place 1-2 drops into both eyes 3 (three) times daily as needed for dry eyes.    . sodium chloride (OCEAN) 0.65 % SOLN nasal spray Place 1 spray into both nostrils as needed for congestion.     No current facility-administered medications on file prior to visit.     Allergies  Allergen Reactions  . Penicillins Anaphylaxis and Other (See Comments)    Has patient had a PCN reaction causing immediate rash, facial/tongue/throat swelling, SOB or lightheadedness with hypotension: Yes Has patient had a PCN reaction causing severe rash involving mucus membranes or skin necrosis: No Has patient had a PCN reaction that required hospitalization: No Has patient had a PCN reaction occurring within the last 10 years: No If all of the above answers are "NO", then may proceed with Cephalosporin use.   . Tetanus Toxoid Anaphylaxis  . Demerol Other (See Comments)    Severe nausea  . Codeine Nausea Only and Other (See Comments)    Severe nausea    Past Medical History:  Diagnosis Date  . Aortic stenosis, severe   . CKD (chronic kidney disease)   . Complication of anesthesia   . Coronary artery disease    a. 12/20/2016: s/p DES to LAD   . Diverticulosis   . GERD (gastroesophageal reflux disease)   . Hemorrhoids   . Hyperlipidemia   . Hypertension   . Hypothyroidism   . Osteoarthritis   . Paroxysmal atrial fibrillation (HCC)    a. on [place] with Xarelto but switched to Eliquis after stenting.   . Presence of permanent cardiac pacemaker   . S/P TAVR (transcatheter aortic valve replacement) 01/29/2017   23 mm Edwards Sapien 3 transcatheter heart valve placed  via percutaneous right transfemoral approach   . Sick sinus syndrome ([place])    a. s/p PPM (MDT)    Past Surgical History:  Procedure Laterality Date  . ABDOMINAL HYSTERECTOMY  1980  . BASAL CELL CARCINOMA EXCISION    . CARDIAC CATHETERIZATION  ~ 11/2016  . CATARACT EXTRACTION W/ INTRAOCULAR LENS  IMPLANT, BILATERAL Bilateral   . CORONARY ANGIOPLASTY WITH STENT PLACEMENT  12/20/2016   "1 stent"  . CORONARY STENT INTERVENTION N/A 12/20/2016   Procedure: CORONARY STENT INTERVENTION;  Surgeon: Martinique, Addeline Calarco M, MD;  Location: [facility];  Service: Cardiovascular;  Laterality: N/A;  . FINGER SURGERY Left    "fell; developed skiers thumb; had to operate on it"  . INSERT / REPLACE /  [place]   for syptomatic bradycardia and syncope -- [facility]  . LEAD REVISION/REPAIR N/A 10/09/2016   New left subclavian MDT Adapta L PPM dual chamber system implanted by Dr [name] with previously placed R subclavian system abandoned  . PACEMAKER GENERATOR CHANGE  2001   pulse generator replacement by Dr. Rollene Fare   . PACEMAKER GENERATOR CHANGE  04/01/2008   PPM Medtronic -- model # [id number] serial # [id number] -- pulse generator replacement by Dr. Verlon Setting   . RIGHT/LEFT HEART CATH AND CORONARY ANGIOGRAPHY N/A 11/27/2016   Procedure: RIGHT/LEFT HEART CATH AND CORONARY ANGIOGRAPHY;  Surgeon: Martinique, Warwick Nick M, MD;  Location: [facility];  Service: Cardiovascular;  Laterality: N/A;  . SQUAMOUS CELL CARCINOMA EXCISION    . TEE WITHOUT CARDIOVERSION N/A 01/29/2017   Procedure: TRANSESOPHAGEAL ECHOCARDIOGRAM (TEE);  Surgeon: Sherren Mocha, MD;  Location: [facility];  Service: Open Heart Surgery;  Laterality: N/A;  . TONSILLECTOMY    . TRANSCATHETER AORTIC VALVE REPLACEMENT, TRANSFEMORAL N/A 01/29/2017   Procedure: TRANSCATHETER AORTIC VALVE REPLACEMENT, TRANSFEMORAL;  Surgeon: Sherren Mocha, MD;  Location: [facility];  Service: Open Heart Surgery;  Laterality: N/A;    Social History    Tobacco Use  Smoking Status Former Smoker  . Packs/day: 1.50  . Years: 30.00  . Pack years: 45.00  . Types: Cigarettes  . Last attempt to quit: 03/05/1978  . Years since quitting: 39.6  Smokeless Tobacco Never Used    Social History   Substance and Sexual Activity  Alcohol Use No    Family History  Problem Relation Age of Onset  . Cancer Father        lung cancer  . Alzheimer's disease Mother     Review of Systems: As noted in history of present illness.  All other systems were reviewed and are negative.  Physical Exam: BP (!) 110/50 (BP Location: Left Arm, Patient Position: Sitting, Cuff Size: Normal)   Pulse 88   Ht 5\' 6"  (1.676 m)   Wt 124 lb (56.2 kg)   BMI 20.01 kg/m  GENERAL:  Well appearing, elderly WF in NAD HEENT:  PERRL, EOMI, sclera are clear. Oropharynx is clear. NECK:  No jugular venous distention, carotid upstroke brisk and symmetric, no bruits, no thyromegaly or adenopathy LUNGS:  Clear to auscultation bilaterally CHEST:  Unremarkable HEART:  RRR,  PMI not displaced or sustained,S1 and S2 within normal limits, no S3, no S4: no clicks, no rubs, very soft systolic murmur at the apex. ABD:  Soft, nontender. BS +, no masses or bruits. No hepatomegaly, no splenomegaly EXT:  2 + pulses throughout, no edema, no cyanosis no clubbing SKIN:  Warm and dry.  No rashes NEURO:  Alert and oriented x 3. Cranial nerves II through XII intact. PSYCH:  Cognitively intact      LABORATORY DATA:  Lab Results  Component Value Date   WBC 6.9 02/06/2017   HGB 10.5 (L) 02/06/2017   HCT 32.9 (L) 02/06/2017   PLT 236 02/06/2017   GLUCOSE 92 02/06/2017   ALT 15 01/28/2017   AST 26 01/28/2017   NA 144 02/06/2017   K 3.7 02/06/2017   CL 104 02/06/2017   CREATININE 1.19 (H) 02/06/2017   BUN 14 02/06/2017   CO2 24 02/06/2017   TSH 0.90 06/13/2011   INR 1.23 01/29/2017   HGBA1C 5.5 01/28/2017   Labs dated 07/15/15: cholesterol 229, triglycerides 111, HDL 76, LDL  131.  Dated 07/19/16: cholesterol 247, triglycerides 71, HDL  88, LDL 145. BUN 28, creatinine 1.3. Other chemistries, CBC and TSH normal. Dated 03/15/17: creatinine 1.7, Hgb 11.3. Dated 10/22/17: cholesterol 251, triglycerides 66, HDL 93, LDL 145. Hgb 11.7, creatinine 1.3. TSH and ALT normal.    Echo 12/26/16: Study Conclusions  - Left ventricle: The cavity size was normal. Wall thickness was   normal. Systolic function was normal. The estimated ejection   fraction was in the range of 60% to 65%. Wall motion was normal;   there were no regional wall motion abnormalities. Features are   consistent with a pseudonormal left ventricular filling pattern,   with concomitant abnormal relaxation and increased filling   pressure (grade 2 diastolic dysfunction). - Aortic valve: Moderately to severely calcified annulus.   Moderately thickened, moderately calcified leaflets. There was   moderate stenosis. There was mild regurgitation. Valve area   (VTI): 0.88 cm^2. Valve area (Vmax): 0.78 cm^2. Valve area   (Vmean): 0.74 cm^2. - Mitral valve: Calcified annulus. - Right atrium: The atrium was mildly dilated.  Echo 02/21/17: Study Conclusions  - Left ventricle: Wall thickness was increased in a pattern of mild   LVH. Systolic function was normal. The estimated ejection   fraction was in the range of 55% to 65%. - Aortic valve: Post TAVR with 23 mm Sapien 3 valve. Meand gradient   9 mmHg slightly lower than previous. Leaflets not well visualized   Trivial peri prosthetic regurgitation and mild ? central AR Valve   area (VTI): 1.54 cm^2. Valve area (Vmax): 1.41 cm^2. Valve area   (Vmean): 1.5 cm^2. - Mitral valve: Thickening and mild diastolic doming of anterior   leaflet Moderate eccentric MR directed posteriorly. There was   moderate regurgitation. - Left atrium: The atrium was mildly dilated. - Atrial septum: No defect or patent foramen ovale was identified. - Tricuspid valve: There was  moderate regurgitation. - Pulmonary arteries: PA peak pressure: 32 mm Hg (S).   Myoview 10/03/16: Study Highlights     The left ventricular ejection fraction is normal (55-65%).  Nuclear stress EF: 59%.  There was no ST segment deviation noted during stress.  The study is normal.   1. EF 59%, normal wall motion.  2. No evidence for ischemia or infarction by perfusion images.   Normal study.     Procedures   RIGHT/LEFT HEART CATH AND CORONARY ANGIOGRAPHY  Conclusion     Mid LAD lesion, 30 %stenosed.  Mid LAD to Dist LAD lesion, 85 %stenosed.  Prox RCA to Mid RCA lesion, 20 %stenosed.  Dist RCA lesion, 30 %stenosed.  Ost LM lesion, 10 %stenosed.  LV end diastolic pressure is normal.  LV end diastolic pressure is normal.   1. Single vessel obstructive CAD with 85% mid LAD stenosis 2. Severe aortic stenosis. Mean gradient of 40 mm Hg. AVA 0.61 cm squared with index .37. 3. Normal cardiac output. 4. Normal right heart and LV filling pressures  Plan: will discuss options for AVR. I think her best option based on age and co-morbidities would be TAVR with stenting of mid LAD. Will discuss with heart valve team.   Indications   Nonrheumatic aortic valve stenosis [I35.0 (ICD-10-CM)]  Procedural Details/Technique   Technical Details Indication: 85 yo WF with progressive dyspnea on exertion and aortic stenosis  Procedural Details: The right wrist was prepped, draped, and anesthetized with 1% lidocaine. Using the modified Seldinger technique a 6 Fr slender sheath was placed in the right radial artery and a 5 French sheath was placed in  the right brachial vein. We were unable to pass the Swan- Ganz catheter from the right brachial approach due to inability to pass catheter past the old pacing wires. The right groin was prepped, draped and anesthetized with 1% lidocaine. A 7 Fr sheath was placed percutaneously in the right femoral vein. A Swan-Ganz catheter was used for  the right heart catheterization. Standard protocol was followed for recording of right heart pressures and sampling of oxygen saturations. Fick cardiac output was calculated. Standard Judkins catheters were used for selective coronary angiography and left ventricular pressures. There were no immediate procedural complications. The patient was transferred to the post catheterization recovery area for further monitoring.  Contrast: 55 cc   Estimated blood loss <50 mL.  During this procedure the patient was administered the following to achieve and maintain moderate conscious sedation: Versed 1 mg, Fentanyl 25 mcg, while the patient's heart rate, blood pressure, and oxygen saturation were continuously monitored. The period of conscious sedation was 50 minutes, of which I was present face-to-face 100% of this time.    Complications   Complications documented before study signed (11/27/2016 11:04 AM EDT)    No complications were associated with this study.  Documented by Martinique, Ol[place]won Fosdick M, MD - 11/27/2016 11:04 AM EDT    Coronary Findings   Dominance: Right  Left Main  Ost LM lesion, 10% stenosed. The lesion is moderately calcified.  Left Anterior Descending  Mid LAD lesion, 30% stenosed. The lesion is moderately calcified.  Mid LAD to Dist LAD lesion, 85% stenosed.  Left Circumflex  Vessel is small.  Right Coronary Artery  Prox RCA to Mid RCA lesion, 20% stenosed.  Dist RCA lesion, 30% stenosed.  Right Heart   Right Heart Pressures LV EDP is normal.    Left Heart   Left Ventricle LV end diastolic pressure is normal.    Coronary Diagrams   Diagnostic Diagram       Implants     No implant documentation for this case.  MERGE Images   Show images for CARDIAC CATHETERIZATION   Link to Procedure Log   Procedure Log    Hemo Data    Most Recent Value  Fick Cardiac Output 3.62 L/min  Fick Cardiac Output Index 2.22 (L/min)/BSA  Aortic Mean Gradient 39.6 mmHg  Aortic Peak  Gradient 38 mmHg  Aortic Valve Area 0.61  Aortic Value Area Index 0.37 cm2/BSA  RA A Wave 9 mmHg  RA V Wave 9 mmHg  RA Mean 6 mmHg  RV Systolic Pressure 34 mmHg  RV Diastolic Pressure 3 mmHg  RV EDP 10 mmHg  PA Systolic Pressure 32 mmHg  PA Diastolic Pressure 11 mmHg  PA Mean 20 mmHg  PW A Wave 18 mmHg  PW V Wave 23 mmHg  PW Mean 16 mmHg  AO Systolic Pressure 488 mmHg  AO Diastolic Pressure 71 mmHg  AO Mean 891 mmHg  LV Systolic Pressure 694 mmHg  LV Diastolic Pressure 5 mmHg  LV EDP 21 mmHg  Arterial Occlusion Pressure Extended Systolic Pressure 503 mmHg  Arterial Occlusion Pressure Extended Diastolic Pressure 67 mmHg  Arterial Occlusion Pressure Extended Mean Pressure 103 mmHg  Left Ventricular Apex Extended Systolic Pressure 888 mmHg  Left Ventricular Apex Extended Diastolic Pressure 3 mmHg  Left Ventricular Apex Extended EDP Pressure 22 mmHg  QP/QS 1  TPVR Index 9 HRUI  TSVR Index 48.14 HRUI  PVR SVR Ratio 0.04  TPVR/TSVR Ratio 0.19   Stent procedure: Conclusion     Mid  LAD to Dist LAD lesion, 85 %stenosed.  A STENT PROMUS PREM MR 2.75X16 drug eluting stent was successfully placed.  Post intervention, there is a 0% residual stenosis.   1. Successful stenting of the mid LAD with DES      Assessment / Plan: 1. Atrial fibrillation with sick sinus syndrome. Prior history of  persistent AFib.  Now on amiodarone. In NSR.  Continue long-term anticoagulation. On  Eliquis 2.5 mg bid.  Dose adjusted for age and renal function. S/p pacemaker replacement for failed RA lead.   2. Aortic stenosis- moderate by Echo in April and October but by cardiac cath data is more consistent with  Severe AS. She was symptomatic. Now s/p TAVR. She has made a good recovery. Follow up Echo and exam OK.   3. CAD with significant single vessel obstructive disease in the mid LAD. S/p PCI of the LAD with DES October 2018.  On  Plavix and Eliquis. Plan to stop Plavix at one year - so this  October. Continue Eliquis alone after this.  4. HTN- controlled. I have recommended reducing amlodipine to 5 mg daily since BP is so good.   5. Hyperlipidemia. Intolerant of statins. We will try Zetia 10 mg daily  6. Chronic diastolic CHF. She is well compensated. Continue  lasix  5x/wk.   Follow up in 4 months.

## 2017-10-31 ENCOUNTER — Ambulatory Visit: Payer: Medicare Other | Admitting: Cardiology

## 2017-10-31 ENCOUNTER — Encounter: Payer: Self-pay | Admitting: Cardiology

## 2017-10-31 VITALS — BP 110/50 | HR 88 | Ht 66.0 in | Wt 124.0 lb

## 2017-10-31 DIAGNOSIS — I5032 Chronic diastolic (congestive) heart failure: Secondary | ICD-10-CM

## 2017-10-31 DIAGNOSIS — Z952 Presence of prosthetic heart valve: Secondary | ICD-10-CM

## 2017-10-31 DIAGNOSIS — I2510 Atherosclerotic heart disease of native coronary artery without angina pectoris: Secondary | ICD-10-CM | POA: Diagnosis not present

## 2017-10-31 DIAGNOSIS — I350 Nonrheumatic aortic (valve) stenosis: Secondary | ICD-10-CM

## 2017-10-31 DIAGNOSIS — I495 Sick sinus syndrome: Secondary | ICD-10-CM

## 2017-10-31 MED ORDER — EZETIMIBE 10 MG PO TABS: 10.0000 mg | ORAL_TABLET | Freq: Every day | ORAL | 3 refills | Status: DC

## 2017-10-31 MED ORDER — AMLODIPINE BESYLATE 5 MG PO TABS: 5.0000 mg | ORAL_TABLET | Freq: Every day | ORAL | 3 refills | Status: DC

## 2017-10-31 NOTE — Patient Instructions (Addendum)
Reduce amlodipine to 5 mg daily  After October 18 you can stop Plavix.  Take Zetia 10 mg daily for cholesterol.  Continue your other therapy  I will see you in 4 months

## 2017-11-07 LAB — CUP PACEART REMOTE DEVICE CHECK
Brady Statistic AP VS Percent: 94 %
Brady Statistic AS VS Percent: 6 %
Date Time Interrogation Session: 20190812134309
Implantable Lead Implant Date: 20180807
Implantable Lead Location: 753859
Implantable Lead Location: 753860
Implantable Lead Model: 5076
Lead Channel Impedance Value: 559 Ohm
Lead Channel Pacing Threshold Amplitude: 0.75 V
Lead Channel Pacing Threshold Pulse Width: 0.4 ms
Lead Channel Pacing Threshold Pulse Width: 0.4 ms
Lead Channel Setting Sensing Sensitivity: 5.6 mV
MDC IDC LEAD IMPLANT DT: 20180807
MDC IDC MSMT BATTERY IMPEDANCE: 112 Ohm
MDC IDC MSMT BATTERY REMAINING LONGEVITY: 139 mo
MDC IDC MSMT BATTERY VOLTAGE: 2.78 V
MDC IDC MSMT LEADCHNL RA IMPEDANCE VALUE: 329 Ohm
MDC IDC MSMT LEADCHNL RA PACING THRESHOLD AMPLITUDE: 0.625 V
MDC IDC PG IMPLANT DT: 20180807
MDC IDC SET LEADCHNL RA PACING AMPLITUDE: 1.5 V
MDC IDC SET LEADCHNL RV PACING AMPLITUDE: 2.5 V
MDC IDC SET LEADCHNL RV PACING PULSEWIDTH: 0.4 ms
MDC IDC STAT BRADY AP VP PERCENT: 0 %
MDC IDC STAT BRADY AS VP PERCENT: 0 %

## 2017-11-18 ENCOUNTER — Telehealth: Payer: Self-pay | Admitting: Cardiology

## 2017-11-18 NOTE — Telephone Encounter (Signed)
Disregard patient does not want anyone to call her back

## 2017-11-18 NOTE — Telephone Encounter (Signed)
New Message    Patient is requesting a call to discuss different medications that she is thinking about trying. Patient did not want to provide any additional information. Please call to discuss.

## 2017-11-18 NOTE — Telephone Encounter (Signed)
Returned call to patient she stated she is not sleeping well at night.She wanted to ask [name] if ok to take melatonin.Advised I will send message to him.

## 2017-11-21 ENCOUNTER — Telehealth: Payer: Self-pay | Admitting: Cardiology

## 2017-11-21 NOTE — Telephone Encounter (Signed)
Patient asking about taking melatonin to help her sleep. She would like Dr. Doug Sou opinion. Will route to him.

## 2017-11-21 NOTE — Telephone Encounter (Signed)
Returned call to patient left message on personal voice mail [name]'s recommendation.

## 2017-11-21 NOTE — Telephone Encounter (Signed)
No message needed °

## 2017-11-21 NOTE — Telephone Encounter (Signed)
I have no objection to trying melatonin  Randee Huston Martinique MD, [facility]

## 2017-11-21 NOTE — Telephone Encounter (Signed)
New message  Patient would like a call discuss taking melatonin. The patient wants to know what Dr. Martinique thinks about her taking melatonin for for a sleeo aid.

## 2017-11-27 ENCOUNTER — Other Ambulatory Visit: Payer: Self-pay | Admitting: *Deleted

## 2017-11-27 MED ORDER — CLOPIDOGREL BISULFATE 75 MG PO TABS: 75.0000 mg | ORAL_TABLET | Freq: Every evening | ORAL | 2 refills | Status: DC

## 2017-12-05 ENCOUNTER — Other Ambulatory Visit: Payer: Self-pay | Admitting: Physician Assistant

## 2017-12-12 ENCOUNTER — Ambulatory Visit: Payer: Medicare Other | Admitting: Internal Medicine

## 2017-12-12 ENCOUNTER — Encounter: Payer: Self-pay | Admitting: Internal Medicine

## 2017-12-12 ENCOUNTER — Encounter (INDEPENDENT_AMBULATORY_CARE_PROVIDER_SITE_OTHER): Payer: Self-pay

## 2017-12-12 VITALS — BP 120/68 | HR 82 | Ht 66.0 in | Wt 122.8 lb

## 2017-12-12 DIAGNOSIS — I4819 Other persistent atrial fibrillation: Secondary | ICD-10-CM | POA: Diagnosis not present

## 2017-12-12 DIAGNOSIS — Z950 Presence of cardiac pacemaker: Secondary | ICD-10-CM | POA: Diagnosis not present

## 2017-12-12 DIAGNOSIS — I495 Sick sinus syndrome: Secondary | ICD-10-CM | POA: Diagnosis not present

## 2017-12-12 DIAGNOSIS — I350 Nonrheumatic aortic (valve) stenosis: Secondary | ICD-10-CM

## 2017-12-12 LAB — CUP PACEART INCLINIC DEVICE CHECK
Battery Impedance: 112 Ohm
Battery Remaining Longevity: 138 mo
Brady Statistic AP VP Percent: 0.1 % — CL
Brady Statistic AP VS Percent: 93.9 %
Brady Statistic AS VP Percent: 0.1 % — CL
Date Time Interrogation Session: 20191010110902
Implantable Lead Implant Date: 20180807
Implantable Lead Location: 753860
Implantable Lead Model: 5076
Implantable Pulse Generator Implant Date: 20180807
Lead Channel Impedance Value: 334 Ohm
Lead Channel Impedance Value: 560 Ohm
Lead Channel Pacing Threshold Amplitude: 0.75 V
Lead Channel Pacing Threshold Pulse Width: 0.4 ms
Lead Channel Sensing Intrinsic Amplitude: 0.7 mV
Lead Channel Sensing Intrinsic Amplitude: 22.4 mV
Lead Channel Setting Pacing Pulse Width: 0.4 ms
MDC IDC LEAD IMPLANT DT: 20180807
MDC IDC LEAD LOCATION: 753859
MDC IDC MSMT BATTERY VOLTAGE: 2.78 V
MDC IDC MSMT LEADCHNL RV PACING THRESHOLD AMPLITUDE: 0.75 V
MDC IDC MSMT LEADCHNL RV PACING THRESHOLD PULSEWIDTH: 0.4 ms
MDC IDC SET LEADCHNL RA PACING AMPLITUDE: 1.5 V
MDC IDC SET LEADCHNL RV PACING AMPLITUDE: 2.5 V
MDC IDC SET LEADCHNL RV SENSING SENSITIVITY: 5.6 mV
MDC IDC STAT BRADY AS VS PERCENT: 6 %

## 2017-12-12 NOTE — Progress Notes (Signed)
PCP: [name], MD Primary Cardiologist: Dr Martinique Primary EP:  Dr Aurea Graff [name] [name] is a 84 y.o. female who presents today for routine electrophysiology followup.  Since last being seen in our clinic, the patient reports doing very well.  Today, she denies symptoms of palpitations, chest pain, shortness of breath,  lower extremity edema, dizziness, presyncope, or syncope.  The patient is otherwise without complaint today.   Past Medical History:  Diagnosis Date  . Aortic stenosis, severe   . CKD (chronic kidney disease)   . Complication of anesthesia   . Coronary artery disease    a. 12/20/2016: s/p DES to LAD   . Diverticulosis   . GERD (gastroesophageal reflux disease)   . Hemorrhoids   . Hyperlipidemia   . Hypertension   . Hypothyroidism   . Osteoarthritis   . Paroxysmal atrial fibrillation (HCC)    a. on [place] with Xarelto but switched to Eliquis after stenting.   . Presence of permanent cardiac pacemaker   . S/P TAVR (transcatheter aortic valve replacement) 01/29/2017   23 mm Edwards Sapien 3 transcatheter heart valve placed via percutaneous right transfemoral approach   . Sick sinus syndrome ([place])    a. s/p PPM (MDT)   Past Surgical History:  Procedure Laterality Date  . ABDOMINAL HYSTERECTOMY  1980  . BASAL CELL CARCINOMA EXCISION    . CARDIAC CATHETERIZATION  ~ 11/2016  . CATARACT EXTRACTION W/ INTRAOCULAR LENS  IMPLANT, BILATERAL Bilateral   . CORONARY ANGIOPLASTY WITH STENT PLACEMENT  12/20/2016   "1 stent"  . CORONARY STENT INTERVENTION N/A 12/20/2016   Procedure: CORONARY STENT INTERVENTION;  Surgeon: Martinique, Peter M, MD;  Location: [facility] [facility];  Service: Cardiovascular;  Laterality: N/A;  . FINGER SURGERY Left    "fell; developed skiers thumb; had to operate on it"  . INSERT / REPLACE / [place]   for syptomatic bradycardia and syncope -- in [facility]  . LEAD REVISION/REPAIR N/A 10/09/2016   New left subclavian MDT Adapta L PPM  dual chamber system implanted by Dr [name] with previously placed R subclavian system abandoned  . PACEMAKER GENERATOR CHANGE  2001   pulse generator replacement by Dr. Rollene Fare   . PACEMAKER GENERATOR CHANGE  04/01/2008   PPM Medtronic -- model # [id number] serial # [id number] -- pulse generator replacement by Dr. Verlon Setting   . RIGHT/LEFT HEART CATH AND CORONARY ANGIOGRAPHY N/A 11/27/2016   Procedure: RIGHT/LEFT HEART CATH AND CORONARY ANGIOGRAPHY;  Surgeon: Martinique, Peter M, MD;  Location: [place] [facility];  Service: Cardiovascular;  Laterality: N/A;  . SQUAMOUS CELL CARCINOMA EXCISION    . TEE WITHOUT CARDIOVERSION N/A 01/29/2017   Procedure: TRANSESOPHAGEAL ECHOCARDIOGRAM (TEE);  Surgeon: Sherren Mocha, MD;  Location: [place];  Service: Open Heart Surgery;  Laterality: N/A;  . TONSILLECTOMY    . TRANSCATHETER AORTIC VALVE REPLACEMENT, TRANSFEMORAL N/A 01/29/2017   Procedure: TRANSCATHETER AORTIC VALVE REPLACEMENT, TRANSFEMORAL;  Surgeon: Sherren Mocha, MD;  Location: [place];  Service: Open Heart Surgery;  Laterality: N/A;    ROS- all systems are reviewed and negative except as per HPI above  Current Outpatient Medications  Medication Sig Dispense Refill  . acetaminophen (TYLENOL) 500 MG tablet Take 1,000 mg by mouth every 4 (four) hours as needed for moderate pain or headache.     Marland Kitchen amiodarone (PACERONE) 200 MG tablet Take 0.5 tablets (100 mg total) by mouth daily. 45 tablet 3  . amLODipine (NORVASC) 5 MG tablet  Take 1 tablet (5 mg total) by mouth daily. 90 tablet 3  . calcium carbonate (OS-CAL) 600 MG TABS Take 600 mg by mouth 2 (two) times daily with a meal.     . clindamycin (CLEOCIN) 300 MG capsule Take 2 tablets by mouth one hour prior to any dental appointments and stomach or bladder procedure. 2 capsule 4  . clopidogrel (PLAVIX) 75 MG tablet Take 1 tablet (75 mg total) by mouth every evening. 90 tablet 2  . dicyclomine (BENTYL) 10 MG capsule Take 10 mg by mouth 2 (two) times daily as  needed for spasms.   1  . ELIQUIS 2.5 MG TABS tablet TAKE 1 TABLET(2.5 MG) BY MOUTH TWICE DAILY 180 tablet 1  . ezetimibe (ZETIA) 10 MG tablet Take 1 tablet (10 mg total) by mouth daily. 90 tablet 3  . furosemide (LASIX) 20 MG tablet Take one tablet by mouth daily except on Monday and Friday 90 tablet 2  . levothyroxine (SYNTHROID, LEVOTHROID) 112 MCG tablet Take 112 mcg by mouth daily before breakfast.     . loratadine (CLARITIN) 10 MG tablet Take 10 mg by mouth daily as needed for allergies.    . metoprolol tartrate (LOPRESSOR) 25 MG tablet Take 0.5 tablets (12.5 mg total) by mouth 2 (two) times daily. 30 tablet 6  . nitroGLYCERIN (NITROSTAT) 0.4 MG SL tablet Place 0.4 mg under the tongue every 5 (five) minutes as needed for chest pain.    . pantoprazole (PROTONIX) 40 MG tablet Take 1 tablet (40 mg total) by mouth daily. 90 tablet 3  . polyvinyl alcohol (LIQUIFILM TEARS) 1.4 % ophthalmic solution Place 1-2 drops into both eyes 3 (three) times daily as needed for dry eyes.    . sodium chloride (OCEAN) 0.65 % SOLN nasal spray Place 1 spray into both nostrils as needed for congestion.     No current facility-administered medications for this visit.     Physical Exam: Vitals:   12/12/17 1001  BP: 120/68  Pulse: 82  SpO2: 99%  Weight: 122 lb 12.8 oz (55.7 kg)  Height: 5\' 6"  (1.676 m)    GEN- The patient is well appearing, alert and oriented x 3 today.   Head- normocephalic, atraumatic Eyes-  Sclera clear, conjunctiva pink Ears- hearing intact Oropharynx- clear Lungs- Clear to ausculation bilaterally, normal work of breathing Chest- pacemaker pocket is well healed Heart- Regular rate and rhythm, no murmurs, rubs or gallops, PMI not laterally displaced GI- soft, NT, ND, + BS Extremities- no clubbing, cyanosis, or edema  Pacemaker interrogation- reviewed in detail today,  See PACEART report  ekg tracing ordered today is personally reviewed and shows atiral paced rhythm  Assessment  and Plan:  1. Symptomatic sinus bradycardia  Normal pacemaker function See Pace Art report No changes today MDT advisory discussed with her today.  Software patch loaded on her device today.  Per medtronic, her device function should be fully restored.  2. Persistent afib Maintaining sinus with amiodarone 100mg  daily On eliquis  3. S/p TAVR  4. HTN Stable No change required today  [facility]link Return to see EP NP in a year  Thompson Grayer MD, [facility] 12/12/2017 10:48 AM

## 2017-12-12 NOTE — Patient Instructions (Addendum)
Medication Instructions:  Your physician recommends that you continue on your current medications as directed. Please refer to the Current Medication list given to you today.  Labwork: None ordered.  Testing/Procedures: None ordered.  Follow-Up: Your physician wants you to follow-up in: one year with Chanetta Marshall, NP.  You will receive a reminder letter in the mail two months in advance. If you don't receive a letter, please call our office to schedule the follow-up appointment.  Remote monitoring is used to monitor your Pacemaker from home. This monitoring reduces the number of office visits required to check your device to one time per year. It allows [place] to keep an eye on the functioning of your device to ensure it is working properly. You are scheduled for a device check from home on 01/13/2018. You may send your transmission at any time that day. If you have a wireless device, the transmission will be sent automatically. After your physician reviews your transmission, you will receive a postcard with your next transmission date.  Any Other Special Instructions Will Be Listed Below (If Applicable).  If you need a refill on your cardiac medications before your next appointment, please call your pharmacy.

## 2017-12-18 ENCOUNTER — Other Ambulatory Visit (HOSPITAL_COMMUNITY): Payer: Self-pay | Admitting: Nurse Practitioner

## 2017-12-25 ENCOUNTER — Encounter (HOSPITAL_COMMUNITY): Payer: Medicare Other

## 2017-12-31 ENCOUNTER — Other Ambulatory Visit (HOSPITAL_COMMUNITY): Payer: Self-pay | Admitting: *Deleted

## 2018-01-01 ENCOUNTER — Ambulatory Visit (HOSPITAL_COMMUNITY)
Admission: RE | Admit: 2018-01-01 | Discharge: 2018-01-01 | Disposition: A | Discharge status: Home / Self Care | Payer: Medicare Other | Source: Ambulatory Visit | Attending: Internal Medicine | Admitting: Internal Medicine

## 2018-01-01 DIAGNOSIS — M810 Age-related osteoporosis without current pathological fracture: Secondary | ICD-10-CM | POA: Diagnosis not present

## 2018-01-01 MED ORDER — DENOSUMAB 60 MG/ML ~~LOC~~ SOSY
PREFILLED_SYRINGE | SUBCUTANEOUS | Status: AC
  Filled 2018-01-01: qty 1

## 2018-01-01 MED ORDER — DENOSUMAB 60 MG/ML ~~LOC~~ SOSY
60.0000 mg | PREFILLED_SYRINGE | Freq: Once | SUBCUTANEOUS | Status: AC
  Administered 2018-01-01: 60 mg via SUBCUTANEOUS

## 2018-01-13 ENCOUNTER — Ambulatory Visit (INDEPENDENT_AMBULATORY_CARE_PROVIDER_SITE_OTHER): Payer: Medicare Other | Admitting: *Deleted

## 2018-01-13 DIAGNOSIS — I10 Essential (primary) hypertension: Secondary | ICD-10-CM

## 2018-01-13 DIAGNOSIS — I495 Sick sinus syndrome: Secondary | ICD-10-CM

## 2018-01-14 NOTE — Progress Notes (Signed)
Remote pacemaker transmission.   

## 2018-01-20 NOTE — Progress Notes (Deleted)
entered in error   

## 2018-01-20 NOTE — Progress Notes (Signed)
[place]                                       Cardiology Office Note    Date:  01/22/2018   ID:  [name] [name], DOB 11-24-1929, MRN [id number]  PCP:  [name], MD  Cardiologist:  Dr. Martinique / Dr. [name] (EP) / Dr. Burt Knack & Dr. Roxy Manns (TAVR)  CC: 1 year s/p TAVR  History of Present Illness:  [name] [name] is a 84 y.o. female with a history of PAF on Eliquis, tachy-brady s/p PPM, HTN, HLD, hypothyroidism, CKD, CAD s/p PCI/DES to LAD (12/20/16) and severe aortic stenosiss/p TAVR (01/29/17) who presents to clinic for follow up.  Previous echocardiograms had demonstrated moderate aortic stenosis with normal left ventricular systolic function. An echo in April 2018 showed a mean gradient of 30 mmHg. The dimensionless index was 0.19. Over the past 4-6 months the patient has had progressive exertional shortness of breath, fatigue, and chest discomfort. She was seen in the atrial fibrillation clinic and noted to be in atrial fibrillation. She was started on amiodarone and converted to sinus rhythm. She was also found to have failure of her right atrial lead and underwent explant and placement of a new dual-chamber pacemaker on 10/09/2016. She continued to have symptoms and underwent cardiac catheterization on 11/27/2016. This showed severe aortic stenosis with a mean transvalvular gradient of 40 mmHg and anaortic valve area of 0.61 cm. Cardiac catheterization also showed an 85% stenosis of the mid LAD which was treated with a drug-eluting stent on 12/20/2016.   She underwentsuccessful TAVR with a 82mm Edwards Sapien THVvia the TF approachon 01/29/17.Post operative echo showedgood valve placement with trivial PVL. Post operative course was complicated by acute pericarditis, afib with RVR and post operative anemia. She was treated with ibuprofen/colchicine, amiodarone loading and 1 U PRBCs. She was discharged on ASA and plavix. Home  Eliquis 2.5mg  BID was held at discharge given acute pericarditis and concern for hemorraghic conversion. Later switched back to Eliquis and Plavix.   One month echo showed normal functioning bioprosthetic valve with a mean gradient of 9 mmHg and trivial PVL as well as mild central AR.   Today she presents to clinic for follow up. She has been doing okay. She has mostly been dealing with neck pain and sciatica. This limits her walking. She is able to do all her grocery shopping and chores around the house with no issue. She does get some shortness of breath with walking up slight inclines. Overall she has had a lot of improvements in her overall stamina and dyspnea. She also gets some cramping in her left breast into her armpit that she has had intermittently since she was a young woman. No Le edema, orthopnea or PND. No dizziness or syncope. No blood in stool or urine. No palpitations.     Past Medical History:  Diagnosis Date  . Aortic stenosis, severe   . CKD (chronic kidney disease)   . Complication of anesthesia   . Coronary artery disease    a. 12/20/2016: s/p DES to LAD   . Diverticulosis   . GERD (gastroesophageal reflux disease)   . Hemorrhoids   . Hyperlipidemia   . Hypertension   . Hypothyroidism   . Osteoarthritis   . Paroxysmal atrial fibrillation (HCC)    a. on [place] with Xarelto  but switched to Eliquis after stenting.   . Presence of permanent cardiac pacemaker   . S/P TAVR (transcatheter aortic valve replacement) 01/29/2017   23 mm Edwards Sapien 3 transcatheter heart valve placed via percutaneous right transfemoral approach   . Sick sinus syndrome ([place])    a. s/p PPM (MDT)    Past Surgical History:  Procedure Laterality Date  . ABDOMINAL HYSTERECTOMY  1980  . BASAL CELL CARCINOMA EXCISION    . CARDIAC CATHETERIZATION  ~ 11/2016  . CATARACT EXTRACTION W/ INTRAOCULAR LENS  IMPLANT, BILATERAL Bilateral   . CORONARY ANGIOPLASTY WITH STENT PLACEMENT  12/20/2016   "1  stent"  . CORONARY STENT INTERVENTION N/A 12/20/2016   Procedure: CORONARY STENT INTERVENTION;  Surgeon: Martinique, Peter M, MD;  Location: [place] [facility];  Service: Cardiovascular;  Laterality: N/A;  . FINGER SURGERY Left    "fell; developed skiers thumb; had to operate on it"  . INSERT / REPLACE / [place]   for syptomatic bradycardia and syncope -- in [facility]  . LEAD REVISION/REPAIR N/A 10/09/2016   New left subclavian MDT Adapta L PPM dual chamber system implanted by Dr [name] with previously placed R subclavian system abandoned  . PACEMAKER GENERATOR CHANGE  2001   pulse generator replacement by Dr. Rollene Fare   . PACEMAKER GENERATOR CHANGE  04/01/2008   PPM Medtronic -- model # ADDRL1 serial # [id number] -- pulse generator replacement by Dr. Verlon Setting   . RIGHT/LEFT HEART CATH AND CORONARY ANGIOGRAPHY N/A 11/27/2016   Procedure: RIGHT/LEFT HEART CATH AND CORONARY ANGIOGRAPHY;  Surgeon: Martinique, Peter M, MD;  Location: [place] [facility];  Service: Cardiovascular;  Laterality: N/A;  . SQUAMOUS CELL CARCINOMA EXCISION    . TEE WITHOUT CARDIOVERSION N/A 01/29/2017   Procedure: TRANSESOPHAGEAL ECHOCARDIOGRAM (TEE);  Surgeon: Sherren Mocha, MD;  Location: [place];  Service: Open Heart Surgery;  Laterality: N/A;  . TONSILLECTOMY    . TRANSCATHETER AORTIC VALVE REPLACEMENT, TRANSFEMORAL N/A 01/29/2017   Procedure: TRANSCATHETER AORTIC VALVE REPLACEMENT, TRANSFEMORAL;  Surgeon: Sherren Mocha, MD;  Location: [place];  Service: Open Heart Surgery;  Laterality: N/A;    Current Medications: Outpatient Medications Prior to Visit  Medication Sig Dispense Refill  . acetaminophen (TYLENOL) 500 MG tablet Take 1,000 mg by mouth every 4 (four) hours as needed for moderate pain or headache.     Marland Kitchen amiodarone (PACERONE) 200 MG tablet Take 0.5 tablets (100 mg total) by mouth daily. 45 tablet 3  . amLODipine (NORVASC) 5 MG tablet Take 1 tablet (5 mg total) by mouth daily. 90 tablet 3  .  calcium carbonate (OS-CAL) 600 MG TABS Take 600 mg by mouth 2 (two) times daily with a meal.     . clindamycin (CLEOCIN) 300 MG capsule Take 2 tablets by mouth one hour prior to any dental appointments and stomach or bladder procedure. 2 capsule 4  . dicyclomine (BENTYL) 10 MG capsule Take 10 mg by mouth 2 (two) times daily as needed for spasms.   1  . ELIQUIS 2.5 MG TABS tablet TAKE 1 TABLET(2.5 MG) BY MOUTH TWICE DAILY 180 tablet 1  . ezetimibe (ZETIA) 10 MG tablet Take 1 tablet (10 mg total) by mouth daily. 90 tablet 3  . furosemide (LASIX) 20 MG tablet Take one tablet by mouth daily except on Monday and Friday 90 tablet 2  . levothyroxine (SYNTHROID, LEVOTHROID) 112 MCG tablet Take 112 mcg by mouth daily before breakfast.     . loratadine (CLARITIN) 10  MG tablet Take 10 mg by mouth daily as needed for allergies.    . metoprolol tartrate (LOPRESSOR) 25 MG tablet Take 0.5 tablets (12.5 mg total) by mouth 2 (two) times daily. 30 tablet 6  . nitroGLYCERIN (NITROSTAT) 0.4 MG SL tablet Place 0.4 mg under the tongue every 5 (five) minutes as needed for chest pain.    . pantoprazole (PROTONIX) 40 MG tablet Take 1 tablet (40 mg total) by mouth daily. 90 tablet 3  . polyvinyl alcohol (LIQUIFILM TEARS) 1.4 % ophthalmic solution Place 1-2 drops into both eyes 3 (three) times daily as needed for dry eyes.    . sodium chloride (OCEAN) 0.65 % SOLN nasal spray Place 1 spray into both nostrils as needed for congestion.    Marland Kitchen amiodarone (PACERONE) 200 MG tablet Take 0.5 tablets (100 mg total) by mouth daily. 45 tablet 1  . clopidogrel (PLAVIX) 75 MG tablet Take 1 tablet (75 mg total) by mouth every evening. 90 tablet 2   No facility-administered medications prior to visit.      Allergies:   Penicillins; Tetanus toxoid; Demerol; and Codeine   Social History   Socioeconomic History  . Marital status: Widowed    Spouse name: Not on file  . Number of children: 0  . Years of education: Not on file  . Highest  education level: Not on file  Occupational History    Employer: RETIRED  Social Needs  . Financial resource strain: Not on file  . Food insecurity:    Worry: Not on file    Inability: Not on file  . Transportation needs:    Medical: Not on file    Non-medical: Not on file  Tobacco Use  . Smoking status: Former Smoker    Packs/day: 1.50    Years: 30.00    Pack years: 45.00    Types: Cigarettes    Last attempt to quit: 03/05/1978    Years since quitting: 39.9  . Smokeless tobacco: Never Used  Substance and Sexual Activity  . Alcohol use: No  . Drug use: No  . Sexual activity: Not on file  Lifestyle  . Physical activity:    Days per week: Not on file    Minutes per session: Not on file  . Stress: Not on file  Relationships  . Social connections:    Talks on phone: Not on file    Gets together: Not on file    Attends religious service: Not on file    Active member of club or organization: Not on file    Attends meetings of clubs or organizations: Not on file    Relationship status: Not on file  Other Topics Concern  . Not on file  Social History Narrative  . Not on file     Family History:  The patient's family history includes Alzheimer's disease in her mother; Cancer in her father.     ROS:   Please see the history of present illness.    ROS All other systems reviewed and are negative.   PHYSICAL EXAM:   VS:  BP 122/68   Pulse 81   Ht 5\' 6"  (1.676 m)   Wt 124 lb 9.6 oz (56.5 kg)   SpO2 98%   BMI 20.11 kg/m    GEN: Well nourished, well developed, in no acute distress HEENT: normal Neck: no JVD or masses Cardiac: RRR; soft flow murmur. No rubs, or gallops,no edema  Respiratory:  clear to auscultation bilaterally, normal work of breathing  GI: soft, nontender, nondistended, + BS MS: no deformity or atrophy Skin: warm and dry, no rash Neuro:  Alert and Oriented x 3, Strength and sensation are intact Psych: euthymic mood, full affect    Wt Readings from  Last 3 Encounters:  01/22/18 124 lb 9.6 oz (56.5 kg)  12/12/17 122 lb 12.8 oz (55.7 kg)  10/31/17 124 lb (56.2 kg)      Studies/Labs Reviewed:   EKG:  EKG is NOT ordered today.    Recent Labs: 01/28/2017: ALT 15; B Natriuretic Peptide 522.0 01/30/2017: Magnesium 1.8 02/06/2017: BUN 14; Creatinine, Ser 1.19; Hemoglobin 10.5; Platelets 236; Potassium 3.7; Sodium 144   Lipid Panel No results found for: CHOL, TRIG, HDL, CHOLHDL, VLDL, LDLCALC, LDLDIRECT  Additional studies/ records that were reviewed today include:  TAVR OPERATIVE NOTE   Date of Procedure:01/29/2017  Preoperative Diagnosis:Severe Aortic Stenosis   Postoperative Diagnosis:Same   Procedure:   Transcatheter Aortic Valve Replacement - Percutaneous Transfemoral Approach Edwards Sapien 3 THV (size 39mm, model # 9600TFX, serial # [id number])  Co-Surgeons:Clarence H. Roxy Manns, MD and Sherren Mocha, MD  Pre-operative Echo Findings: ? Severe aortic stenosis ? Normalleft ventricular systolic function  Post-operative Echo Findings: ? Noparavalvular leak ? Normalleft ventricular systolic function  _____________________  Echo 02/21/17 (1 month s/p TAVR) Study Conclusions - Left ventricle: Wall thickness was increased in a pattern of mild LVH. Systolic function was normal. The estimated ejection fraction was in the range of 55% to 65%. - Aortic valve: Post TAVR with 23 mm Sapien 3 valve. Meand gradient 9 mmHg slightly lower than previous. Leaflets not well visualized Trivial peri prosthetic regurgitation and mild ? central AR Valve area (VTI): 1.54 cm^2. Valve area (Vmax): 1.41 cm^2. Valve area (Vmean): 1.5 cm^2. - Mitral valve: Thickening and mild diastolic doming of anterior leaflet Moderate eccentric MR directed posteriorly. There was moderate regurgitation. - Left atrium: The atrium was mildly dilated. - Atrial  septum: No defect or patent foramen ovale was identified. - Tricuspid valve: There was moderate regurgitation. - Pulmonary arteries: PA peak pressure: 32 mm Hg (S).   ____________________   Echo 11/201/19 Study Conclusions  - Left ventricle: The cavity size was normal. Wall thickness was   normal. Systolic function was normal. The estimated ejection   fraction was in the range of 60% to 65%. Wall motion was normal;   there were no regional wall motion abnormalities. Doppler   parameters are consistent with abnormal left ventricular   relaxation (grade 1 diastolic dysfunction). - Aortic valve: A bioprosthesis was present and functioning   normally. There was mild perivalvular regurgitation in the 2   o&'clock position. Peak velocity (S): 221 cm/s. Mean gradient (S):   9 mm Hg. Peak gradient (S): 20 mm Hg. - Mitral valve: There was mild regurgitation. - Left atrium: The atrium was mildly dilated. - Right ventricle: Pacer wire or catheter noted in right ventricle. - Tricuspid valve: There was mild regurgitation. - Pulmonary arteries: Systolic pressure was mildly increased. PA   peak pressure: 37 mm Hg (S).  ASSESSMENT & PLAN:   Severe ASs/p TAVR:doing well. Echo today shows EF 60%, normally functioning TAVR with mean gradient 37mmHg and mild PVL. She has NYHA class II symptoms. She has clindamycin for SBE prophylaxis. Continue on long term Eliquis 2.5mg  daily for PAF. Stop plavix now.   CAD: s/p DES to LAD on 12/20/16. She is on Eliquis 2.5mg  BID and plavix 75mg  daily. Will plan to stop plavix at this time.  PAF: continue  amio 100mg  daily and Eliquis  HTN:Bp well controlled today   HLD: intolerant to statins   SSS s/p PPM: Followed by Dr. [name]  Medication Adjustments/Labs and Tests Ordered: Current medicines are reviewed at length with the patient today.  Concerns regarding medicines are outlined above.  Medication changes, Labs and Tests ordered today are listed in  the Patient Instructions below. Patient Instructions  Medication Instructions:  Your physician has recommended you make the following change in your medication: Stop Plavix (Clopidogrel)  If you need a refill on your cardiac medications before your next appointment, please call your pharmacy.   Lab work: none If you have labs (blood work) drawn today and your tests are completely normal, you will receive your results only by: Marland Kitchen MyChart Message (if you have MyChart) OR . A paper copy in the mail If you have any lab test that is abnormal or we need to change your treatment, we will call you to review the results.  Testing/Procedures: none  Follow-Up: Follow up with Dr. Martinique as planned.      Signed, Angelena Form, PA-C  01/22/2018 3:20 PM    [facility] [address], [place], [place]  [address] Phone: [phone]; Fax: [phone]

## 2018-01-22 ENCOUNTER — Ambulatory Visit (HOSPITAL_COMMUNITY): Payer: Medicare Other | Attending: Cardiology

## 2018-01-22 ENCOUNTER — Other Ambulatory Visit: Payer: Self-pay

## 2018-01-22 ENCOUNTER — Ambulatory Visit: Payer: Medicare Other | Admitting: Physician Assistant

## 2018-01-22 ENCOUNTER — Encounter: Payer: Self-pay | Admitting: Physician Assistant

## 2018-01-22 VITALS — BP 122/68 | HR 81 | Ht 66.0 in | Wt 124.6 lb

## 2018-01-22 DIAGNOSIS — I480 Paroxysmal atrial fibrillation: Secondary | ICD-10-CM | POA: Diagnosis not present

## 2018-01-22 DIAGNOSIS — E785 Hyperlipidemia, unspecified: Secondary | ICD-10-CM

## 2018-01-22 DIAGNOSIS — I350 Nonrheumatic aortic (valve) stenosis: Secondary | ICD-10-CM | POA: Diagnosis not present

## 2018-01-22 DIAGNOSIS — I10 Essential (primary) hypertension: Secondary | ICD-10-CM

## 2018-01-22 DIAGNOSIS — Z952 Presence of prosthetic heart valve: Secondary | ICD-10-CM

## 2018-01-22 DIAGNOSIS — I5032 Chronic diastolic (congestive) heart failure: Secondary | ICD-10-CM | POA: Diagnosis not present

## 2018-01-22 DIAGNOSIS — I2510 Atherosclerotic heart disease of native coronary artery without angina pectoris: Secondary | ICD-10-CM | POA: Diagnosis not present

## 2018-01-22 DIAGNOSIS — Z950 Presence of cardiac pacemaker: Secondary | ICD-10-CM

## 2018-01-22 LAB — ECHOCARDIOGRAM COMPLETE
HEIGHTINCHES: 66 in
WEIGHTICAEL: 1993.6 [oz_av]

## 2018-01-22 NOTE — Patient Instructions (Signed)
Medication Instructions:  Your physician has recommended you make the following change in your medication: Stop Plavix (Clopidogrel)  If you need a refill on your cardiac medications before your next appointment, please call your pharmacy.   Lab work: none If you have labs (blood work) drawn today and your tests are completely normal, you will receive your results only by: Marland Kitchen MyChart Message (if you have MyChart) OR . A paper copy in the mail If you have any lab test that is abnormal or we need to change your treatment, we will call you to review the results.  Testing/Procedures: none  Follow-Up: Follow up with Dr. Martinique as planned.

## 2018-01-31 NOTE — Progress Notes (Signed)
[name] [name] Date of Birth: 02/26/1930 Medical Record #[id number]  History of Present Illness: [name] [name] is seen today for follow up CAD and AV stenosis. She is s/p  PCI with DES to LAD on December 20, 2016.   She has a history of paroxysmal atrial fibrillation with tachy-brady syndrome and is s/p pacemaker implant. She is on chronic anticoagulation with Eliquis. Echo in July 2015 showed moderate aortic stenosis that had progressed since 2012. Repeat in July 2016 and July 2017 showed no change. In April 2017 she had atrial fibrillation that converted spontaneously. This was felt to be triggered by a steroid injection. Her metoprolol was increased at that time. When seen in June 2017 she was noted to be in persistent Afib. We placed her on Flecainide but this was later stopped due to side effects. She was seen in November with recurrent and persistent Afib. Rate was controlled but she was symptomatic. Seen in Afib clinic and started on amiodarone with subsequent conversion to NSR.   When last see she complained  of  SOB with activity- this was present even before amiodarone and hasn't improved with restoration of NSR.   She did have a upper and lower EGD which was negative except for few polyps. She had PFTs in December that showed mild obstructive defect with marked decrease in diffusion capacity. CT of the Abdomen was negative for any pathology. She reports [name] [name] did an [place] as well.  Myoview study was normal. When seen by [name] [name] in late July it was noted that she had failure of her RA lead. Her device was reprogrammed but she developed pectoral stimulation. She underwent explantation of her old generator and placement of a new left chest dual chamber pacemaker on October 09 2016. Because of her ongoing symptoms we recommended a right and left heart cath and this was done on 11/27/16. This demonstrated severe AS as well as a severe stenosis in the mid LAD. She subsequently underwent successful PCI of  the LAD with DES on 12/20/16 with 2.75 x 16 mm Promus stent.   She underwent TAVR on November 27,2018  with a #23 Edwards Sapien valve. Post op course complicated by pericarditis and AFib with RVR she was treated with anti-inflammatories and Amiodarone. Later Eliquis resumed and ASA stopped. Still on Plavix for stent. Follow up Echo was [place]. Repeat Echo 01/22/18 still showed good results. Pacemaker check on 12/12/17 showed normal function without atrial high rate episodes. When seen in the valve clinic on 01/22/18 Plavix was discontinued.   On follow up she is doing very well. Her energy level is good. She denies any chest pain or SOB. Able to do ADLs without difficulty.   No dizziness or syncope. No Afib noted on last pacer check. Unable to tolerate even low dose statin due to myalgias.   Current Outpatient Medications on File Prior to Visit  Medication Sig Dispense Refill  . acetaminophen (TYLENOL) 500 MG tablet Take 1,000 mg by mouth every 4 (four) hours as needed for moderate pain or headache.     Marland Kitchen amLODipine (NORVASC) 5 MG tablet Take 1 tablet (5 mg total) by mouth daily. 90 tablet 3  . calcium carbonate (OS-CAL) 600 MG TABS Take 600 mg by mouth 2 (two) times daily with a meal.     . clindamycin (CLEOCIN) 300 MG capsule Take 2 tablets by mouth one hour prior to any dental appointments and stomach or bladder procedure. 2 capsule 4  . dicyclomine (BENTYL)  10 MG capsule Take 10 mg by mouth 2 (two) times daily as needed for spasms.   1  . ELIQUIS 2.5 MG TABS tablet TAKE 1 TABLET(2.5 MG) BY MOUTH TWICE DAILY 180 tablet 1  . ezetimibe (ZETIA) 10 MG tablet Take 1 tablet (10 mg total) by mouth daily. 90 tablet 3  . furosemide (LASIX) 20 MG tablet Take one tablet by mouth daily except on Monday and Friday 90 tablet 2  . levothyroxine (SYNTHROID, LEVOTHROID) 112 MCG tablet Take 112 mcg by mouth daily before breakfast.     . loratadine (CLARITIN) 10 MG tablet Take 10 mg by mouth daily as needed for  allergies.    . metoprolol tartrate (LOPRESSOR) 25 MG tablet Take 0.5 tablets (12.5 mg total) by mouth 2 (two) times daily. 30 tablet 6  . nitroGLYCERIN (NITROSTAT) 0.4 MG SL tablet Place 0.4 mg under the tongue every 5 (five) minutes as needed for chest pain.    . polyvinyl alcohol (LIQUIFILM TEARS) 1.4 % ophthalmic solution Place 1-2 drops into both eyes 3 (three) times daily as needed for dry eyes.    . sodium chloride (OCEAN) 0.65 % SOLN nasal spray Place 1 spray into both nostrils as needed for congestion.     No current facility-administered medications on file prior to visit.     Allergies  Allergen Reactions  . Penicillins Anaphylaxis and Other (See Comments)    Has patient had a PCN reaction causing immediate rash, facial/tongue/throat swelling, SOB or lightheadedness with hypotension: Yes Has patient had a PCN reaction causing severe rash involving mucus membranes or skin necrosis: No Has patient had a PCN reaction that required hospitalization: No Has patient had a PCN reaction occurring within the last 10 years: No If all of the above answers are "NO", then may proceed with Cephalosporin use.   . Tetanus Toxoid Anaphylaxis  . Demerol Other (See Comments)    Severe nausea  . Codeine Nausea Only and Other (See Comments)    Severe nausea    Past Medical History:  Diagnosis Date  . Aortic stenosis, severe   . CKD (chronic kidney disease)   . Complication of anesthesia   . Coronary artery disease    a. 12/20/2016: s/p DES to LAD   . Diverticulosis   . GERD (gastroesophageal reflux disease)   . Hemorrhoids   . Hyperlipidemia   . Hypertension   . Hypothyroidism   . Osteoarthritis   . Paroxysmal atrial fibrillation (HCC)    a. on [place] with Xarelto but switched to Eliquis after stenting.   . Presence of permanent cardiac pacemaker   . S/P TAVR (transcatheter aortic valve replacement) 01/29/2017   23 mm Edwards Sapien 3 transcatheter heart valve placed via percutaneous  right transfemoral approach   . Sick sinus syndrome ([place])    a. s/p PPM (MDT)    Past Surgical History:  Procedure Laterality Date  . ABDOMINAL HYSTERECTOMY  1980  . BASAL CELL CARCINOMA EXCISION    . CARDIAC CATHETERIZATION  ~ 11/2016  . CATARACT EXTRACTION W/ INTRAOCULAR LENS  IMPLANT, BILATERAL Bilateral   . CORONARY ANGIOPLASTY WITH STENT PLACEMENT  12/20/2016   "1 stent"  . CORONARY STENT INTERVENTION N/A 12/20/2016   Procedure: CORONARY STENT INTERVENTION;  Surgeon: Martinique, Kien Mirsky M, MD;  Location: [facility];  Service: Cardiovascular;  Laterality: N/A;  . FINGER SURGERY Left    "fell; developed skiers thumb; had to operate on it"  . INSERT / REPLACE / REMOVE PACEMAKER  1993   for syptomatic bradycardia and syncope -- in [facility]  . LEAD REVISION/REPAIR N/A 10/09/2016   New left subclavian MDT Adapta L PPM dual chamber system implanted by Dr Rayann [name] with previously placed R subclavian system abandoned  . PACEMAKER GENERATOR CHANGE  2001   pulse generator replacement by Dr. Rollene Fare   . PACEMAKER GENERATOR CHANGE  04/01/2008   PPM Medtronic -- model # [id number] serial # [id number] -- pulse generator replacement by Dr. Verlon Setting   . RIGHT/LEFT HEART CATH AND CORONARY ANGIOGRAPHY N/A 11/27/2016   Procedure: RIGHT/LEFT HEART CATH AND CORONARY ANGIOGRAPHY;  Surgeon: Martinique, Josealberto Montalto M, MD;  Location: [facility];  Service: Cardiovascular;  Laterality: N/A;  . SQUAMOUS CELL CARCINOMA EXCISION    . TEE WITHOUT CARDIOVERSION N/A 01/29/2017   Procedure: TRANSESOPHAGEAL ECHOCARDIOGRAM (TEE);  Surgeon: Sherren Mocha, MD;  Location: [facility];  Service: Open Heart Surgery;  Laterality: N/A;  . TONSILLECTOMY    . TRANSCATHETER AORTIC VALVE REPLACEMENT, TRANSFEMORAL N/A 01/29/2017   Procedure: TRANSCATHETER AORTIC VALVE REPLACEMENT, TRANSFEMORAL;  Surgeon: Sherren Mocha, MD;  Location: [facility];  Service: Open Heart Surgery;  Laterality: N/A;    Social History   Tobacco Use  Smoking  Status Former Smoker  . Packs/day: 1.50  . Years: 30.00  . Pack years: 45.00  . Types: Cigarettes  . Last attempt to quit: 03/05/1978  . Years since quitting: 39.9  Smokeless Tobacco Never Used    Social History   Substance and Sexual Activity  Alcohol Use No    Family History  Problem Relation Age of Onset  . Cancer Father        lung cancer  . Alzheimer's disease Mother     Review of Systems: As noted in history of present illness.  All other systems were reviewed and are negative.  Physical Exam: BP (!) 143/82   Pulse 89   Ht 5\' 6"  (1.676 m)   Wt 126 lb 6.4 oz (57.3 kg)   BMI 20.40 kg/m  GENERAL:  Well appearing, elderly WF in NAD HEENT:  PERRL, EOMI, sclera are clear. Oropharynx is clear. NECK:  No jugular venous distention, carotid upstroke brisk and symmetric, no bruits, no thyromegaly or adenopathy LUNGS:  Clear to auscultation bilaterally CHEST:  Unremarkable HEART:  RRR,  PMI not displaced or sustained,S1 and S2 within normal limits, no S3, no S4: no clicks, no rubs, very soft systolic murmur at the apex. ABD:  Soft, nontender. BS +, no masses or bruits. No hepatomegaly, no splenomegaly EXT:  2 + pulses throughout, no edema, no cyanosis no clubbing SKIN:  Warm and dry.  No rashes NEURO:  Alert and oriented x 3. Cranial nerves II through XII intact. PSYCH:  Cognitively intact      LABORATORY DATA:  Lab Results  Component Value Date   WBC 6.9 02/06/2017   HGB 10.5 (L) 02/06/2017   HCT 32.9 (L) 02/06/2017   PLT 236 02/06/2017   GLUCOSE 92 02/06/2017   ALT 15 01/28/2017   AST 26 01/28/2017   NA 144 02/06/2017   K 3.7 02/06/2017   CL 104 02/06/2017   CREATININE 1.19 (H) 02/06/2017   BUN 14 02/06/2017   CO2 24 02/06/2017   TSH 0.90 06/13/2011   INR 1.23 01/29/2017   HGBA1C 5.5 01/28/2017   Labs dated 07/15/15: cholesterol 229, triglycerides 111, HDL 76, LDL 131.  Dated 07/19/16: cholesterol 247, triglycerides 71, HDL 88, LDL 145. BUN 28,  creatinine 1.3. Other chemistries, CBC and  TSH normal. Dated 03/15/17: creatinine 1.7, Hgb 11.3. Dated 10/22/17: cholesterol 251, triglycerides 66, HDL 93, LDL 145. Hgb 11.7, creatinine 1.3. TSH and ALT normal.    Echo 12/26/16: Study Conclusions  - Left ventricle: The cavity size was normal. Wall thickness was   normal. Systolic function was normal. The estimated ejection   fraction was in the range of 60% to 65%. Wall motion was normal;   there were no regional wall motion abnormalities. Features are   consistent with a pseudonormal left ventricular filling pattern,   with concomitant abnormal relaxation and increased filling   pressure (grade 2 diastolic dysfunction). - Aortic valve: Moderately to severely calcified annulus.   Moderately thickened, moderately calcified leaflets. There was   moderate stenosis. There was mild regurgitation. Valve area   (VTI): 0.88 cm^2. Valve area (Vmax): 0.78 cm^2. Valve area   (Vmean): 0.74 cm^2. - Mitral valve: Calcified annulus. - Right atrium: The atrium was mildly dilated.  Echo 02/21/17: Study Conclusions  - Left ventricle: Wall thickness was increased in a pattern of mild   LVH. Systolic function was normal. The estimated ejection   fraction was in the range of 55% to 65%. - Aortic valve: Post TAVR with 23 mm Sapien 3 valve. Meand gradient   9 mmHg slightly lower than previous. Leaflets not well visualized   Trivial peri prosthetic regurgitation and mild ? central AR Valve   area (VTI): 1.54 cm^2. Valve area (Vmax): 1.41 cm^2. Valve area   (Vmean): 1.5 cm^2. - Mitral valve: Thickening and mild diastolic doming of anterior   leaflet Moderate eccentric MR directed posteriorly. There was   moderate regurgitation. - Left atrium: The atrium was mildly dilated. - Atrial septum: No defect or patent foramen ovale was identified. - Tricuspid valve: There was moderate regurgitation. - Pulmonary arteries: PA peak pressure: 32 mm Hg (S).  Echo  01/22/18: Study Conclusions  - Left ventricle: The cavity size was normal. Wall thickness was   normal. Systolic function was normal. The estimated ejection   fraction was in the range of 60% to 65%. Wall motion was normal;   there were no regional wall motion abnormalities. Doppler   parameters are consistent with abnormal left ventricular   relaxation (grade 1 diastolic dysfunction). - Aortic valve: A bioprosthesis was present and functioning   normally. There was mild perivalvular regurgitation in the 2   o&'clock position. Peak velocity (S): 221 cm/s. Mean gradient (S):   9 mm Hg. Peak gradient (S): 20 mm Hg. - Mitral valve: There was mild regurgitation. - Left atrium: The atrium was mildly dilated. - Right ventricle: Pacer wire or catheter noted in right ventricle. - Tricuspid valve: There was mild regurgitation. - Pulmonary arteries: Systolic pressure was mildly increased. PA   peak pressure: 37 mm Hg (S).   Myoview 10/03/16: Study Highlights     The left ventricular ejection fraction is normal (55-65%).  Nuclear stress EF: 59%.  There was no ST segment deviation noted during stress.  The study is normal.   1. EF 59%, normal wall motion.  2. No evidence for ischemia or infarction by perfusion images.   Normal study.     Procedures   RIGHT/LEFT HEART CATH AND CORONARY ANGIOGRAPHY  Conclusion     Mid LAD lesion, 30 %stenosed.  Mid LAD to Dist LAD lesion, 85 %stenosed.  Prox RCA to Mid RCA lesion, 20 %stenosed.  Dist RCA lesion, 30 %stenosed.  Ost LM lesion, 10 %stenosed.  LV end diastolic pressure  is normal.  LV end diastolic pressure is normal.   1. Single vessel obstructive CAD with 85% mid LAD stenosis 2. Severe aortic stenosis. Mean gradient of 40 mm Hg. AVA 0.61 cm squared with index .37. 3. Normal cardiac output. 4. Normal right heart and LV filling pressures  Plan: will discuss options for AVR. I think her best option based on age and  co-morbidities would be TAVR with stenting of mid LAD. Will discuss with heart valve team.   Indications   Nonrheumatic aortic valve stenosis [I35.0 (ICD-10-CM)]  Procedural Details/Technique   Technical Details Indication: 84 yo WF with progressive dyspnea on exertion and aortic stenosis  Procedural Details: The right wrist was prepped, draped, and anesthetized with 1% lidocaine. Using the modified Seldinger technique a 6 Fr slender sheath was placed in the right radial artery and a 5 French sheath was placed in the right brachial vein. We were unable to pass the Swan- Ganz catheter from the right brachial approach due to inability to pass catheter past the old pacing wires. The right groin was prepped, draped and anesthetized with 1% lidocaine. A 7 Fr sheath was placed percutaneously in the right femoral vein. A Swan-Ganz catheter was used for the right heart catheterization. Standard protocol was followed for recording of right heart pressures and sampling of oxygen saturations. Fick cardiac output was calculated. Standard Judkins catheters were used for selective coronary angiography and left ventricular pressures. There were no immediate procedural complications. The patient was transferred to the post catheterization recovery area for further monitoring.  Contrast: 55 cc   Estimated blood loss <50 mL.  During this procedure the patient was administered the following to achieve and maintain moderate conscious sedation: Versed 1 mg, Fentanyl 25 mcg, while the patient's heart rate, blood pressure, and oxygen saturation were continuously monitored. The period of conscious sedation was 50 minutes, of which I was present face-to-face 100% of this time.    Complications   Complications documented before study signed (11/27/2016 11:04 AM EDT)    No complications were associated with this study.  Documented by Martinique, Ezrie Bunyan M, MD - 11/27/2016 11:04 AM EDT    Coronary Findings   Dominance:  Right  Left Main  Ost LM lesion, 10% stenosed. The lesion is moderately calcified.  Left Anterior Descending  Mid LAD lesion, 30% stenosed. The lesion is moderately calcified.  Mid LAD to Dist LAD lesion, 85% stenosed.  Left Circumflex  Vessel is small.  Right Coronary Artery  Prox RCA to Mid RCA lesion, 20% stenosed.  Dist RCA lesion, 30% stenosed.  Right Heart   Right Heart Pressures LV EDP is normal.    Left Heart   Left Ventricle LV end diastolic pressure is normal.    Coronary Diagrams   Diagnostic Diagram       Implants     No implant documentation for this case.  MERGE Images   Show images for CARDIAC CATHETERIZATION   Link to Procedure Log   Procedure Log    Hemo Data    Most Recent Value  Fick Cardiac Output 3.62 L/min  Fick Cardiac Output Index 2.22 (L/min)/BSA  Aortic Mean Gradient 39.6 mmHg  Aortic Peak Gradient 38 mmHg  Aortic Valve Area 0.61  Aortic Value Area Index 0.37 cm2/BSA  RA A Wave 9 mmHg  RA V Wave 9 mmHg  RA Mean 6 mmHg  RV Systolic Pressure 34 mmHg  RV Diastolic Pressure 3 mmHg  RV EDP 10 mmHg  PA Systolic Pressure  32 mmHg  PA Diastolic Pressure 11 mmHg  PA Mean 20 mmHg  PW A Wave 18 mmHg  PW V Wave 23 mmHg  PW Mean 16 mmHg  AO Systolic Pressure 734 mmHg  AO Diastolic Pressure 71 mmHg  AO Mean 193 mmHg  LV Systolic Pressure 790 mmHg  LV Diastolic Pressure 5 mmHg  LV EDP 21 mmHg  Arterial Occlusion Pressure Extended Systolic Pressure 240 mmHg  Arterial Occlusion Pressure Extended Diastolic Pressure 67 mmHg  Arterial Occlusion Pressure Extended Mean Pressure 103 mmHg  Left Ventricular Apex Extended Systolic Pressure 973 mmHg  Left Ventricular Apex Extended Diastolic Pressure 3 mmHg  Left Ventricular Apex Extended EDP Pressure 22 mmHg  QP/QS 1  TPVR Index 9 HRUI  TSVR Index 48.14 HRUI  PVR SVR Ratio 0.04  TPVR/TSVR Ratio 0.19   Stent procedure: Conclusion     Mid LAD to Dist LAD lesion, 85 %stenosed.  A STENT  PROMUS PREM MR 2.75X16 drug eluting stent was successfully placed.  Post intervention, there is a 0% residual stenosis.   1. Successful stenting of the mid LAD with DES      Assessment / Plan: 1. Atrial fibrillation with sick sinus syndrome. Prior history of  persistent AFib.  Now on amiodarone 100 mg daily. Maintaining NSR. No Afib episodes on last pacer check.  Continue long-term anticoagulation. On  Eliquis 2.5 mg bid.  Dose adjusted for age and renal function. S/p pacemaker replacement for failed RA lead. Will check chemistries and TSH today.  2. Aortic stenosis-   symptomatic. Now s/p TAVR one year ago. She has made an excellent  recovery. Follow up Echo and exam OK. Only mild perivalvular leak.  3. CAD with significant single vessel obstructive disease in the mid LAD. S/p PCI of the LAD with DES October 2018.  On   Eliquis. Now off Plavix.   4. HTN- controlled.  5. Hyperlipidemia. Intolerant of statins. Now on Zetia 10 mg daily. Will follow up chemistries and lipid panel. May need to consider PCSK 9 inhibitor if still high  6. Chronic diastolic CHF. She is well compensated. Continue  lasix  5x/wk.   Follow up in 6 months.

## 2018-02-05 ENCOUNTER — Encounter: Payer: Self-pay | Admitting: Thoracic Surgery (Cardiothoracic Vascular Surgery)

## 2018-02-05 ENCOUNTER — Ambulatory Visit: Payer: Medicare Other | Admitting: Cardiology

## 2018-02-05 ENCOUNTER — Encounter: Payer: Self-pay | Admitting: Cardiology

## 2018-02-05 VITALS — BP 143/82 | HR 89 | Ht 66.0 in | Wt 126.4 lb

## 2018-02-05 DIAGNOSIS — I480 Paroxysmal atrial fibrillation: Secondary | ICD-10-CM | POA: Diagnosis not present

## 2018-02-05 DIAGNOSIS — I495 Sick sinus syndrome: Secondary | ICD-10-CM | POA: Diagnosis not present

## 2018-02-05 DIAGNOSIS — I2510 Atherosclerotic heart disease of native coronary artery without angina pectoris: Secondary | ICD-10-CM

## 2018-02-05 DIAGNOSIS — I10 Essential (primary) hypertension: Secondary | ICD-10-CM | POA: Diagnosis not present

## 2018-02-05 DIAGNOSIS — E785 Hyperlipidemia, unspecified: Secondary | ICD-10-CM

## 2018-02-05 DIAGNOSIS — Z952 Presence of prosthetic heart valve: Secondary | ICD-10-CM

## 2018-02-05 DIAGNOSIS — I350 Nonrheumatic aortic (valve) stenosis: Secondary | ICD-10-CM

## 2018-02-05 MED ORDER — AMIODARONE HCL 200 MG PO TABS: 100.0000 mg | ORAL_TABLET | Freq: Every day | ORAL | 3 refills | Status: DC

## 2018-02-05 MED ORDER — PANTOPRAZOLE SODIUM 40 MG PO TBEC: 40.0000 mg | DELAYED_RELEASE_TABLET | Freq: Every day | ORAL | 3 refills | Status: DC

## 2018-02-05 NOTE — Patient Instructions (Signed)
We will check on your cholesterol levels.  Continue your current therapy  Follow up in 6 months.

## 2018-02-06 ENCOUNTER — Other Ambulatory Visit: Payer: Self-pay

## 2018-02-06 LAB — CBC WITH DIFFERENTIAL/PLATELET
Basophils Absolute: 0 10*3/uL (ref 0.0–0.2)
Basos: 1 %
EOS (ABSOLUTE): 0.1 10*3/uL (ref 0.0–0.4)
Eos: 1 %
HEMOGLOBIN: 11.6 g/dL (ref 11.1–15.9)
Hematocrit: 36 % (ref 34.0–46.6)
Immature Grans (Abs): 0 10*3/uL (ref 0.0–0.1)
Immature Granulocytes: 0 %
Lymphocytes Absolute: 1.2 10*3/uL (ref 0.7–3.1)
Lymphs: 23 %
MCH: 29.1 pg (ref 26.6–33.0)
MCHC: 32.2 g/dL (ref 31.5–35.7)
MCV: 91 fL (ref 79–97)
MONOCYTES: 9 %
Monocytes Absolute: 0.5 10*3/uL (ref 0.1–0.9)
Neutrophils Absolute: 3.5 10*3/uL (ref 1.4–7.0)
Neutrophils: 66 %
Platelets: 182 10*3/uL (ref 150–450)
RBC: 3.98 x10E6/uL (ref 3.77–5.28)
RDW: 14.4 % (ref 12.3–15.4)
WBC: 5.3 10*3/uL (ref 3.4–10.8)

## 2018-02-06 LAB — COMPREHENSIVE METABOLIC PANEL
ALT: 9 IU/L (ref 0–32)
AST: 27 IU/L (ref 0–40)
Albumin/Globulin Ratio: 1.9 (ref 1.2–2.2)
Albumin: 4.2 g/dL (ref 3.5–4.7)
Alkaline Phosphatase: 74 IU/L (ref 39–117)
BUN/Creatinine Ratio: 19 (ref 12–28)
BUN: 29 mg/dL — ABNORMAL HIGH (ref 8–27)
Bilirubin Total: 0.7 mg/dL (ref 0.0–1.2)
CO2: 25 mmol/L (ref 20–29)
CREATININE: 1.54 mg/dL — AB (ref 0.57–1.00)
Calcium: 9.6 mg/dL (ref 8.7–10.3)
Chloride: 98 mmol/L (ref 96–106)
GFR calc Af Amer: 35 mL/min/{1.73_m2} — ABNORMAL LOW (ref >59)
GFR calc non Af Amer: 30 mL/min/{1.73_m2} — ABNORMAL LOW (ref >59)
Globulin, Total: 2.2 g/dL (ref 1.5–4.5)
Glucose: 85 mg/dL (ref 65–99)
Potassium: 4.8 mmol/L (ref 3.5–5.2)
Sodium: 138 mmol/L (ref 134–144)
Total Protein: 6.4 g/dL (ref 6.0–8.5)

## 2018-02-06 LAB — LIPID PANEL
Chol/HDL Ratio: 2.1 ratio (ref 0.0–4.4)
Cholesterol, Total: 219 mg/dL — ABNORMAL HIGH (ref 100–199)
HDL: 103 mg/dL (ref >39)
LDL CALC: 102 mg/dL — AB (ref 0–99)
Triglycerides: 70 mg/dL (ref 0–149)
VLDL Cholesterol Cal: 14 mg/dL (ref 5–40)

## 2018-02-06 LAB — TSH: TSH: 9.62 u[IU]/mL — AB (ref 0.450–4.500)

## 2018-03-07 LAB — CUP PACEART REMOTE DEVICE CHECK
Battery Remaining Longevity: 132 mo
Brady Statistic AP VP Percent: 0 %
Brady Statistic AP VS Percent: 94 %
Brady Statistic AS VP Percent: 0 %
Brady Statistic AS VS Percent: 6 %
Date Time Interrogation Session: 20191111155549
Implantable Lead Implant Date: 20180807
Implantable Lead Implant Date: 20180807
Implantable Lead Location: 753859
Implantable Lead Location: 753860
Implantable Lead Model: 5076
Implantable Lead Model: 5076
Implantable Pulse Generator Implant Date: 20180807
Lead Channel Impedance Value: 322 Ohm
Lead Channel Impedance Value: 557 Ohm
Lead Channel Pacing Threshold Amplitude: 0.625 V
Lead Channel Pacing Threshold Amplitude: 0.75 V
Lead Channel Pacing Threshold Pulse Width: 0.4 ms
Lead Channel Pacing Threshold Pulse Width: 0.4 ms
Lead Channel Setting Pacing Amplitude: 2.5 V
Lead Channel Setting Sensing Sensitivity: 5.6 mV
MDC IDC MSMT BATTERY IMPEDANCE: 112 Ohm
MDC IDC MSMT BATTERY VOLTAGE: 2.78 V
MDC IDC SET LEADCHNL RA PACING AMPLITUDE: 2 V
MDC IDC SET LEADCHNL RV PACING PULSEWIDTH: 0.4 ms

## 2018-03-10 ENCOUNTER — Telehealth: Payer: Self-pay | Admitting: Cardiology

## 2018-03-10 ENCOUNTER — Ambulatory Visit: Payer: Medicare Other | Admitting: Cardiology

## 2018-03-10 ENCOUNTER — Encounter: Payer: Self-pay | Admitting: Cardiology

## 2018-03-10 VITALS — BP 152/72 | HR 80 | Ht 66.0 in | Wt 125.4 lb

## 2018-03-10 DIAGNOSIS — R0789 Other chest pain: Secondary | ICD-10-CM | POA: Diagnosis not present

## 2018-03-10 DIAGNOSIS — I2510 Atherosclerotic heart disease of native coronary artery without angina pectoris: Secondary | ICD-10-CM | POA: Diagnosis not present

## 2018-03-10 DIAGNOSIS — I480 Paroxysmal atrial fibrillation: Secondary | ICD-10-CM | POA: Diagnosis not present

## 2018-03-10 DIAGNOSIS — Z952 Presence of prosthetic heart valve: Secondary | ICD-10-CM

## 2018-03-10 NOTE — Telephone Encounter (Signed)
  Patient would like some advice regarding some "cramping" in chest area. She would like to discuss with nurse and see if she needs to be seen. She said cramping comes and goes. Patient states that right now she feels uncomfortable.

## 2018-03-10 NOTE — Progress Notes (Signed)
[name] [name] Date of Birth: [birth year] Medical Record #[id number]  History of Present Illness: [name] [name] is seen today as a work in for evaluation of chest pain. She is s/p  PCI with DES to LAD on December 20, 2016.   She has a history of paroxysmal atrial fibrillation with tachy-brady syndrome and is s/p pacemaker implant. She is on chronic anticoagulation with Eliquis. Echo in July 2015 showed moderate aortic stenosis that had progressed since 2012. Repeat in July 2016 and July 2017 showed no change. In April 2017 she had atrial fibrillation that converted spontaneously. This was felt to be triggered by a steroid injection. Her metoprolol was increased at that time. When seen in June 2017 she was noted to be in persistent Afib. We placed her on Flecainide but this was later stopped due to side effects. She was seen in November with recurrent and persistent Afib. Rate was controlled but she was symptomatic. Seen in Afib clinic and started on amiodarone with subsequent conversion to NSR.   When last see she complained  of  SOB with activity- this was present even before amiodarone and hasn't improved with restoration of NSR.   She did have a upper and lower EGD which was negative except for few polyps. She had PFTs in December that showed mild obstructive defect with marked decrease in diffusion capacity. CT of the Abdomen was negative for any pathology. She reports Dr. Virgina Jock did an [place] as well.  Myoview study was normal. When seen by Dr. [name] in late July it was noted that she had failure of her RA lead. Her device was reprogrammed but she developed pectoral stimulation. She underwent explantation of her old generator and placement of a new left chest dual chamber pacemaker on October 09 2016. Because of her ongoing symptoms we recommended a right and left heart cath and this was done on 11/27/16. This demonstrated severe AS as well as a severe stenosis in the mid LAD. She subsequently underwent  successful PCI of the LAD with DES on 12/20/16 with 2.75 x 16 mm Promus stent.   She underwent TAVR on November 27,2018  with a #23 Edwards Sapien valve. Post op course complicated by pericarditis and AFib with RVR she was treated with anti-inflammatories and Amiodarone. Later Eliquis resumed and ASA stopped. Still on Plavix for stent. Follow up Echo was [place]. Repeat Echo 01/22/18 still showed good results. Pacemaker check on 12/12/17 showed normal function without atrial high rate episodes. When seen in the valve clinic on 01/22/18 Plavix was discontinued.   She notes that over the past 3 days she has experienced a sharp cramp in the left precordium and under the left breast. This is sudden and grabs then lets go. It is momentary. No prolonged symptoms. Started after she had been putting away [employer] and did note this required some lifting over her head.   Current Outpatient Medications on File Prior to Visit  Medication Sig Dispense Refill  . acetaminophen (TYLENOL) 500 MG tablet Take 1,000 mg by mouth every 4 (four) hours as needed for moderate pain or headache.     Marland Kitchen amiodarone (PACERONE) 200 MG tablet Take 0.5 tablets (100 mg total) by mouth daily. 45 tablet 3  . amLODipine (NORVASC) 5 MG tablet Take 1 tablet (5 mg total) by mouth daily. 90 tablet 3  . calcium carbonate (OS-CAL) 600 MG TABS Take 600 mg by mouth 2 (two) times daily with a meal.     . clindamycin (  CLEOCIN) 300 MG capsule Take 2 tablets by mouth one hour prior to any dental appointments and stomach or bladder procedure. 2 capsule 4  . dicyclomine (BENTYL) 10 MG capsule Take 10 mg by mouth 2 (two) times daily as needed for spasms.   1  . ELIQUIS 2.5 MG TABS tablet TAKE 1 TABLET(2.5 MG) BY MOUTH TWICE DAILY 180 tablet 1  . ezetimibe (ZETIA) 10 MG tablet Take 1 tablet (10 mg total) by mouth daily. 90 tablet 3  . furosemide (LASIX) 20 MG tablet Take 20 mg every other day 90 tablet 3  . levothyroxine (SYNTHROID, LEVOTHROID) 112  MCG tablet Take 112 mcg by mouth daily before breakfast.     . loratadine (CLARITIN) 10 MG tablet Take 10 mg by mouth daily as needed for allergies.    . metoprolol tartrate (LOPRESSOR) 25 MG tablet Take 0.5 tablets (12.5 mg total) by mouth 2 (two) times daily. 30 tablet 6  . nitroGLYCERIN (NITROSTAT) 0.4 MG SL tablet Place 0.4 mg under the tongue every 5 (five) minutes as needed for chest pain.    . pantoprazole (PROTONIX) 40 MG tablet Take 1 tablet (40 mg total) by mouth daily. 90 tablet 3  . polyvinyl alcohol (LIQUIFILM TEARS) 1.4 % ophthalmic solution Place 1-2 drops into both eyes 3 (three) times daily as needed for dry eyes.    . sodium chloride (OCEAN) 0.65 % SOLN nasal spray Place 1 spray into both nostrils as needed for congestion.     No current facility-administered medications on file prior to visit.     Allergies  Allergen Reactions  . Penicillins Anaphylaxis and Other (See Comments)    Has patient had a PCN reaction causing immediate rash, facial/tongue/throat swelling, SOB or lightheadedness with hypotension: Yes Has patient had a PCN reaction causing severe rash involving mucus membranes or skin necrosis: No Has patient had a PCN reaction that required hospitalization: No Has patient had a PCN reaction occurring within the last 10 years: No If all of the above answers are "NO", then may proceed with Cephalosporin use.   . Tetanus Toxoid Anaphylaxis  . Demerol Other (See Comments)    Severe nausea  . Codeine Nausea Only and Other (See Comments)    Severe nausea    Past Medical History:  Diagnosis Date  . Aortic stenosis, severe   . CKD (chronic kidney disease)   . Complication of anesthesia   . Coronary artery disease    a. 12/20/2016: s/p DES to LAD   . Diverticulosis   . GERD (gastroesophageal reflux disease)   . Hemorrhoids   . Hyperlipidemia   . Hypertension   . Hypothyroidism   . Osteoarthritis   . Paroxysmal atrial fibrillation (HCC)    a. on [place] with  Xarelto but switched to Eliquis after stenting.   . Presence of permanent cardiac pacemaker   . S/P TAVR (transcatheter aortic valve replacement) 01/29/2017   23 mm Edwards Sapien 3 transcatheter heart valve placed via percutaneous right transfemoral approach   . Sick sinus syndrome ([place])    a. s/p PPM (MDT)    Past Surgical History:  Procedure Laterality Date  . ABDOMINAL HYSTERECTOMY  1980  . BASAL CELL CARCINOMA EXCISION    . CARDIAC CATHETERIZATION  ~ 11/2016  . CATARACT EXTRACTION W/ INTRAOCULAR LENS  IMPLANT, BILATERAL Bilateral   . CORONARY ANGIOPLASTY WITH STENT PLACEMENT  12/20/2016   "1 stent"  . CORONARY STENT INTERVENTION N/A 12/20/2016   Procedure: CORONARY STENT INTERVENTION;  Surgeon:  Martinique, Devone Tousley M, MD;  Location: [facility];  Service: Cardiovascular;  Laterality: N/A;  . FINGER SURGERY Left    "fell; developed skiers thumb; had to operate on it"  . INSERT / REPLACE / [place]   for syptomatic bradycardia and syncope -- in [facility]  . LEAD REVISION/REPAIR N/A 10/09/2016   New left subclavian MDT Adapta L PPM dual chamber system implanted by Dr [name] with previously placed R subclavian system abandoned  . PACEMAKER GENERATOR CHANGE  2001   pulse generator replacement by Dr. Rollene Fare   . PACEMAKER GENERATOR CHANGE  04/01/2008   PPM Medtronic -- model # [id number] serial # [id number] -- pulse generator replacement by Dr. Verlon Setting   . RIGHT/LEFT HEART CATH AND CORONARY ANGIOGRAPHY N/A 11/27/2016   Procedure: RIGHT/LEFT HEART CATH AND CORONARY ANGIOGRAPHY;  Surgeon: Martinique, Dellene Mcgroarty M, MD;  Location: [facility];  Service: Cardiovascular;  Laterality: N/A;  . SQUAMOUS CELL CARCINOMA EXCISION    . TEE WITHOUT CARDIOVERSION N/A 01/29/2017   Procedure: TRANSESOPHAGEAL ECHOCARDIOGRAM (TEE);  Surgeon: Sherren Mocha, MD;  Location: [facility];  Service: Open Heart Surgery;  Laterality: N/A;  . TONSILLECTOMY    . TRANSCATHETER AORTIC VALVE REPLACEMENT,  TRANSFEMORAL N/A 01/29/2017   Procedure: TRANSCATHETER AORTIC VALVE REPLACEMENT, TRANSFEMORAL;  Surgeon: Sherren Mocha, MD;  Location: [facility];  Service: Open Heart Surgery;  Laterality: N/A;    Social History   Tobacco Use  Smoking Status Former Smoker  . Packs/day: 1.50  . Years: 30.00  . Pack years: 45.00  . Types: Cigarettes  . Last attempt to quit: 03/05/1978  . Years since quitting: 40.0  Smokeless Tobacco Never Used    Social History   Substance and Sexual Activity  Alcohol Use No    Family History  Problem Relation Age of Onset  . Cancer Father        lung cancer  . Alzheimer's disease Mother     Review of Systems: As noted in history of present illness.  All other systems were reviewed and are negative.  Physical Exam: There were no vitals taken for this visit. GENERAL:  Well appearing elderly WF in NAD HEENT:  PERRL, EOMI, sclera are clear. Oropharynx is clear. NECK:  No jugular venous distention, carotid upstroke brisk and symmetric, no bruits, no thyromegaly or adenopathy LUNGS:  Clear to auscultation bilaterally CHEST:  Tender to palpation in the left precordial area. HEART:  RRR,  PMI not displaced or sustained,S1 and S2 within normal limits, no S3, no S4: soft SEM ABD:  Soft, nontender. BS +, no masses or bruits. No hepatomegaly, no splenomegaly EXT:  2 + pulses throughout, no edema, no cyanosis no clubbing SKIN:  Warm and dry.  No rashes NEURO:  Alert and oriented x 3. Cranial nerves II through XII intact. PSYCH:  Cognitively intact        LABORATORY DATA:  Lab Results  Component Value Date   WBC 5.3 02/05/2018   HGB 11.6 02/05/2018   HCT 36.0 02/05/2018   PLT 182 02/05/2018   GLUCOSE 85 02/05/2018   CHOL 219 (H) 02/05/2018   TRIG 70 02/05/2018   HDL 103 02/05/2018   LDLCALC 102 (H) 02/05/2018   ALT 9 02/05/2018   AST 27 02/05/2018   NA 138 02/05/2018   K 4.8 02/05/2018   CL 98 02/05/2018   CREATININE 1.54 (H) 02/05/2018   BUN 29  (H) 02/05/2018   CO2 25 02/05/2018   TSH 9.620 (H)  02/05/2018   INR 1.23 01/29/2017   HGBA1C 5.5 01/28/2017   Labs dated 07/15/15: cholesterol 229, triglycerides 111, HDL 76, LDL 131.  Dated 07/19/16: cholesterol 247, triglycerides 71, HDL 88, LDL 145. BUN 28, creatinine 1.3. Other chemistries, CBC and TSH normal. Dated 03/15/17: creatinine 1.7, Hgb 11.3. Dated 10/22/17: cholesterol 251, triglycerides 66, HDL 93, LDL 145. Hgb 11.7, creatinine 1.3. TSH and ALT normal.  Ecg today shows atrial paced rhythm rate 80. Otherwise normal. I have personally reviewed and interpreted this study.   Echo 12/26/16: Study Conclusions  - Left ventricle: The cavity size was normal. Wall thickness was   normal. Systolic function was normal. The estimated ejection   fraction was in the range of 60% to 65%. Wall motion was normal;   there were no regional wall motion abnormalities. Features are   consistent with a pseudonormal left ventricular filling pattern,   with concomitant abnormal relaxation and increased filling   pressure (grade 2 diastolic dysfunction). - Aortic valve: Moderately to severely calcified annulus.   Moderately thickened, moderately calcified leaflets. There was   moderate stenosis. There was mild regurgitation. Valve area   (VTI): 0.88 cm^2. Valve area (Vmax): 0.78 cm^2. Valve area   (Vmean): 0.74 cm^2. - Mitral valve: Calcified annulus. - Right atrium: The atrium was mildly dilated.  Echo 02/21/17: Study Conclusions  - Left ventricle: Wall thickness was increased in a pattern of mild   LVH. Systolic function was normal. The estimated ejection   fraction was in the range of 55% to 65%. - Aortic valve: Post TAVR with 23 mm Sapien 3 valve. Meand gradient   9 mmHg slightly lower than previous. Leaflets not well visualized   Trivial peri prosthetic regurgitation and mild ? central AR Valve   area (VTI): 1.54 cm^2. Valve area (Vmax): 1.41 cm^2. Valve area   (Vmean): 1.5 cm^2. -  Mitral valve: Thickening and mild diastolic doming of anterior   leaflet Moderate eccentric MR directed posteriorly. There was   moderate regurgitation. - Left atrium: The atrium was mildly dilated. - Atrial septum: No defect or patent foramen ovale was identified. - Tricuspid valve: There was moderate regurgitation. - Pulmonary arteries: PA peak pressure: 32 mm Hg (S).  Echo 01/22/18: Study Conclusions  - Left ventricle: The cavity size was normal. Wall thickness was   normal. Systolic function was normal. The estimated ejection   fraction was in the range of 60% to 65%. Wall motion was normal;   there were no regional wall motion abnormalities. Doppler   parameters are consistent with abnormal left ventricular   relaxation (grade 1 diastolic dysfunction). - Aortic valve: A bioprosthesis was present and functioning   normally. There was mild perivalvular regurgitation in the 2   o&'clock position. Peak velocity (S): 221 cm/s. Mean gradient (S):   9 mm Hg. Peak gradient (S): 20 mm Hg. - Mitral valve: There was mild regurgitation. - Left atrium: The atrium was mildly dilated. - Right ventricle: Pacer wire or catheter noted in right ventricle. - Tricuspid valve: There was mild regurgitation. - Pulmonary arteries: Systolic pressure was mildly increased. PA   peak pressure: 37 mm Hg (S).   Myoview 10/03/16: Study Highlights     The left ventricular ejection fraction is normal (55-65%).  Nuclear stress EF: 59%.  There was no ST segment deviation noted during stress.  The study is normal.   1. EF 59%, normal wall motion.  2. No evidence for ischemia or infarction by perfusion images.  Normal study.     Procedures   RIGHT/LEFT HEART CATH AND CORONARY ANGIOGRAPHY  Conclusion     Mid LAD lesion, 30 %stenosed.  Mid LAD to Dist LAD lesion, 85 %stenosed.  Prox RCA to Mid RCA lesion, 20 %stenosed.  Dist RCA lesion, 30 %stenosed.  Ost LM lesion, 10 %stenosed.  LV  end diastolic pressure is normal.  LV end diastolic pressure is normal.   1. Single vessel obstructive CAD with 85% mid LAD stenosis 2. Severe aortic stenosis. Mean gradient of 40 mm Hg. AVA 0.61 cm squared with index .37. 3. Normal cardiac output. 4. Normal right heart and LV filling pressures  Plan: will discuss options for AVR. I think her best option based on age and co-morbidities would be TAVR with stenting of mid LAD. Will discuss with heart valve team.   Indications   Nonrheumatic aortic valve stenosis [I35.0 (ICD-10-CM)]  Procedural Details/Technique   Technical Details Indication: 85 yo WF with progressive dyspnea on exertion and aortic stenosis  Procedural Details: The right wrist was prepped, draped, and anesthetized with 1% lidocaine. Using the modified Seldinger technique a 6 Fr slender sheath was placed in the right radial artery and a 5 French sheath was placed in the right brachial vein. We were unable to pass the Swan- Ganz catheter from the right brachial approach due to inability to pass catheter past the old pacing wires. The right groin was prepped, draped and anesthetized with 1% lidocaine. A 7 Fr sheath was placed percutaneously in the right femoral vein. A Swan-Ganz catheter was used for the right heart catheterization. Standard protocol was followed for recording of right heart pressures and sampling of oxygen saturations. Fick cardiac output was calculated. Standard Judkins catheters were used for selective coronary angiography and left ventricular pressures. There were no immediate procedural complications. The patient was transferred to the post catheterization recovery area for further monitoring.  Contrast: 55 cc   Estimated blood loss <50 mL.  During this procedure the patient was administered the following to achieve and maintain moderate conscious sedation: Versed 1 mg, Fentanyl 25 mcg, while the patient's heart rate, blood pressure, and oxygen saturation  were continuously monitored. The period of conscious sedation was 50 minutes, of which I was present face-to-face 100% of this time.    Complications   Complications documented before study signed (11/27/2016 11:04 AM EDT)    No complications were associated with this study.  Documented by Martinique, Aleyah Balik M, MD - 11/27/2016 11:04 AM EDT    Coronary Findings   Dominance: Right  Left Main  Ost LM lesion, 10% stenosed. The lesion is moderately calcified.  Left Anterior Descending  Mid LAD lesion, 30% stenosed. The lesion is moderately calcified.  Mid LAD to Dist LAD lesion, 85% stenosed.  Left Circumflex  Vessel is small.  Right Coronary Artery  Prox RCA to Mid RCA lesion, 20% stenosed.  Dist RCA lesion, 30% stenosed.  Right Heart   Right Heart Pressures LV EDP is normal.    Left Heart   Left Ventricle LV end diastolic pressure is normal.    Coronary Diagrams   Diagnostic Diagram       Implants     No implant documentation for this case.  MERGE Images   Show images for CARDIAC CATHETERIZATION   Link to Procedure Log   Procedure Log    Hemo Data    Most Recent Value  Fick Cardiac Output 3.62 L/min  Fick Cardiac Output Index 2.22 (L/min)/BSA  Aortic Mean Gradient 39.6 mmHg  Aortic Peak Gradient 38 mmHg  Aortic Valve Area 0.61  Aortic Value Area Index 0.37 cm2/BSA  RA A Wave 9 mmHg  RA V Wave 9 mmHg  RA Mean 6 mmHg  RV Systolic Pressure 34 mmHg  RV Diastolic Pressure 3 mmHg  RV EDP 10 mmHg  PA Systolic Pressure 32 mmHg  PA Diastolic Pressure 11 mmHg  PA Mean 20 mmHg  PW A Wave 18 mmHg  PW V Wave 23 mmHg  PW Mean 16 mmHg  AO Systolic Pressure 335 mmHg  AO Diastolic Pressure 71 mmHg  AO Mean 456 mmHg  LV Systolic Pressure 256 mmHg  LV Diastolic Pressure 5 mmHg  LV EDP 21 mmHg  Arterial Occlusion Pressure Extended Systolic Pressure 389 mmHg  Arterial Occlusion Pressure Extended Diastolic Pressure 67 mmHg  Arterial Occlusion Pressure Extended Mean  Pressure 103 mmHg  Left Ventricular Apex Extended Systolic Pressure 373 mmHg  Left Ventricular Apex Extended Diastolic Pressure 3 mmHg  Left Ventricular Apex Extended EDP Pressure 22 mmHg  QP/QS 1  TPVR Index 9 HRUI  TSVR Index 48.14 HRUI  PVR SVR Ratio 0.04  TPVR/TSVR Ratio 0.19   Stent procedure: Conclusion     Mid LAD to Dist LAD lesion, 85 %stenosed.  A STENT PROMUS PREM MR 2.75X16 drug eluting stent was successfully placed.  Post intervention, there is a 0% residual stenosis.   1. Successful stenting of the mid LAD with DES      Assessment / Plan: 1. Atypical chest pain. Symptoms and exam more consistent with musculoskeletal pain with spasms. Ecg is unremarkable. Reassured her. Will treat conservatively with rest, tylenol, and heat.  2.  Atrial fibrillation with sick sinus syndrome. Prior history of  persistent AFib.  Now on amiodarone 100 mg daily. Maintaining NSR. infrequent Afib episodes on last pacer check.  Continue long-term anticoagulation.   2. Aortic stenosis-   symptomatic. Now s/p TAVR one year ago. She has made an excellent  recovery. Follow up Echo and exam OK. Only mild perivalvular leak.  3. CAD with significant single vessel obstructive disease in the mid LAD. S/p PCI of the LAD with DES October 2018.  On   Eliquis. Now off Plavix.   4. HTN- controlled.  5. Hyperlipidemia. Intolerant of statins. Now on Zetia 10 mg daily with improved LDL from 145>>102.   6. Chronic diastolic CHF. She is well compensated. Continue  lasix  5x/wk.   Follow up in 6 months.

## 2018-03-10 NOTE — Telephone Encounter (Signed)
Spoke to patient she stated she has been having cramping in chest off and on since this past Friday.Stated she is concerned this is different.Appointment scheduled with [name] today at 11:40 am.

## 2018-03-10 NOTE — Telephone Encounter (Signed)
Returned call to patient of Dr. Martinique who reports "cramping" in her chest. She reports this is occasional. She does not correlate this to rest, exertion, or eating. She reports the "cramping" can last "for a moment" or a "right good long while". She reports this has woken her up from sleep once. The symptoms have been going on since Friday. She reports this pain has occurred in her thumb & hand as well. She has not tried PRN tyelnol. She has not taken NTG. She reports BP has been "pretty good" (130s) and HR is controlled by pacemaker (70s). She denies shortness of breath except when going up steps (which is not new). She denies N/V.   She has significant cardiac history and reports something doesn't feel right.   Advised would send message to MD & nurse for follow up and advice

## 2018-03-13 ENCOUNTER — Other Ambulatory Visit: Payer: Self-pay | Admitting: *Deleted

## 2018-03-13 MED ORDER — METOPROLOL TARTRATE 25 MG PO TABS: 12.5000 mg | ORAL_TABLET | Freq: Two times a day (BID) | ORAL | 6 refills | Status: DC

## 2018-04-14 ENCOUNTER — Ambulatory Visit: Payer: Medicare Other

## 2018-04-15 ENCOUNTER — Ambulatory Visit (INDEPENDENT_AMBULATORY_CARE_PROVIDER_SITE_OTHER): Payer: Medicare Other

## 2018-04-15 DIAGNOSIS — I495 Sick sinus syndrome: Secondary | ICD-10-CM | POA: Diagnosis not present

## 2018-04-15 LAB — CUP PACEART REMOTE DEVICE CHECK
Battery Impedance: 112 Ohm
Battery Remaining Longevity: 131 mo
Battery Voltage: 2.78 V
Brady Statistic AP VP Percent: 0 %
Brady Statistic AP VS Percent: 94 %
Brady Statistic AS VP Percent: 0 %
Brady Statistic AS VS Percent: 6 %
Date Time Interrogation Session: 20200211152733
Implantable Lead Implant Date: 20180807
Implantable Lead Implant Date: 20180807
Implantable Lead Location: 753859
Implantable Lead Location: 753860
Implantable Lead Model: 5076
Implantable Lead Model: 5076
Implantable Pulse Generator Implant Date: 20180807
Lead Channel Impedance Value: 318 Ohm
Lead Channel Impedance Value: 522 Ohm
Lead Channel Pacing Threshold Amplitude: 0.625 V
Lead Channel Pacing Threshold Amplitude: 0.75 V
Lead Channel Pacing Threshold Pulse Width: 0.4 ms
Lead Channel Pacing Threshold Pulse Width: 0.4 ms
Lead Channel Setting Pacing Amplitude: 2 V
Lead Channel Setting Pacing Amplitude: 2.5 V
Lead Channel Setting Pacing Pulse Width: 0.4 ms
Lead Channel Setting Sensing Sensitivity: 5.6 mV

## 2018-04-19 IMAGING — DX DG CHEST 2V
2 series · 2 of 2 positions shown · non-contrast
Comparison: 06/06/2015 chest radiograph.

CLINICAL DATA: 86 y/o F; fluttering in chest tonight atrial
fibrillation, hypertension, and pacemaker.

EXAM:
CHEST  2 VIEW

[chest pa]
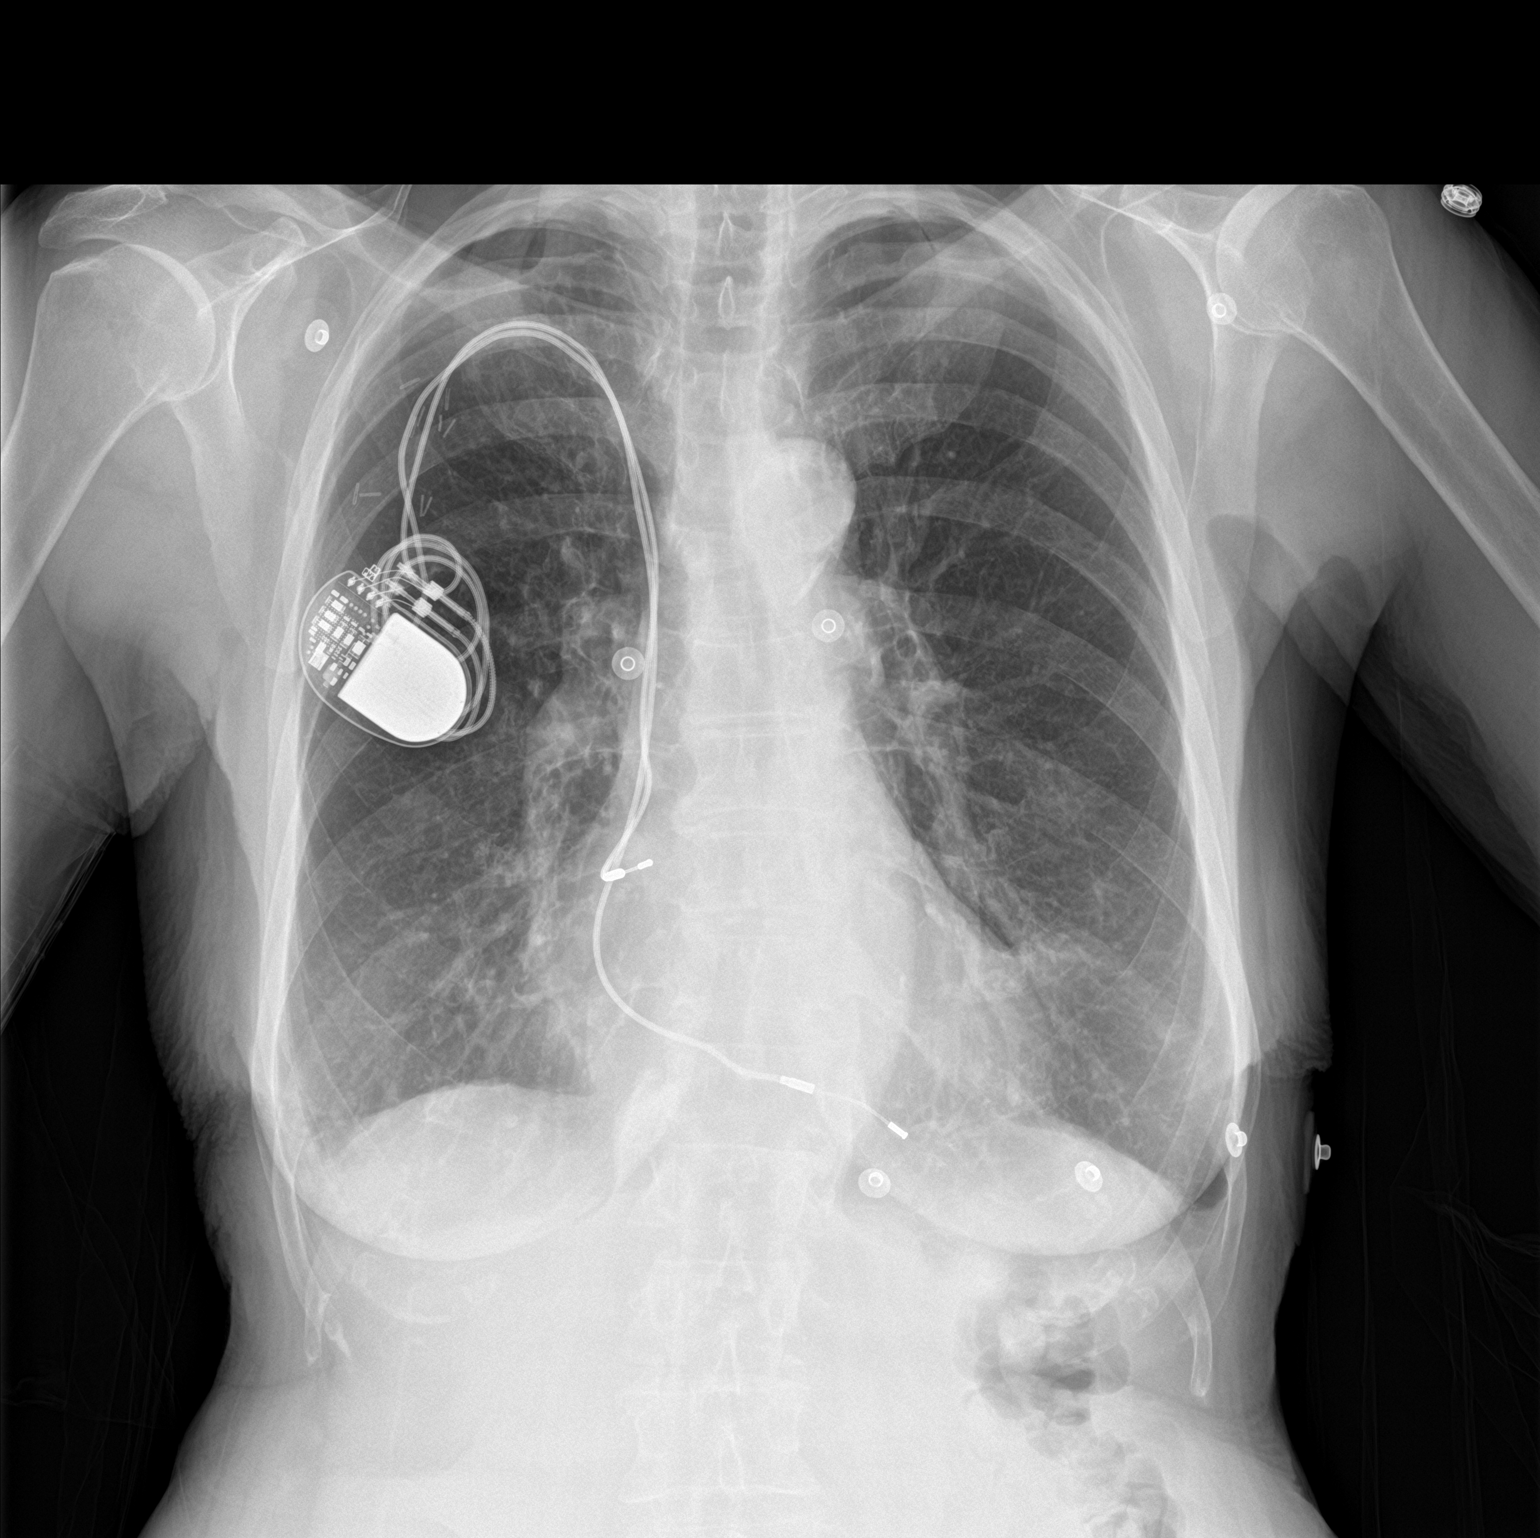

[chest lat]
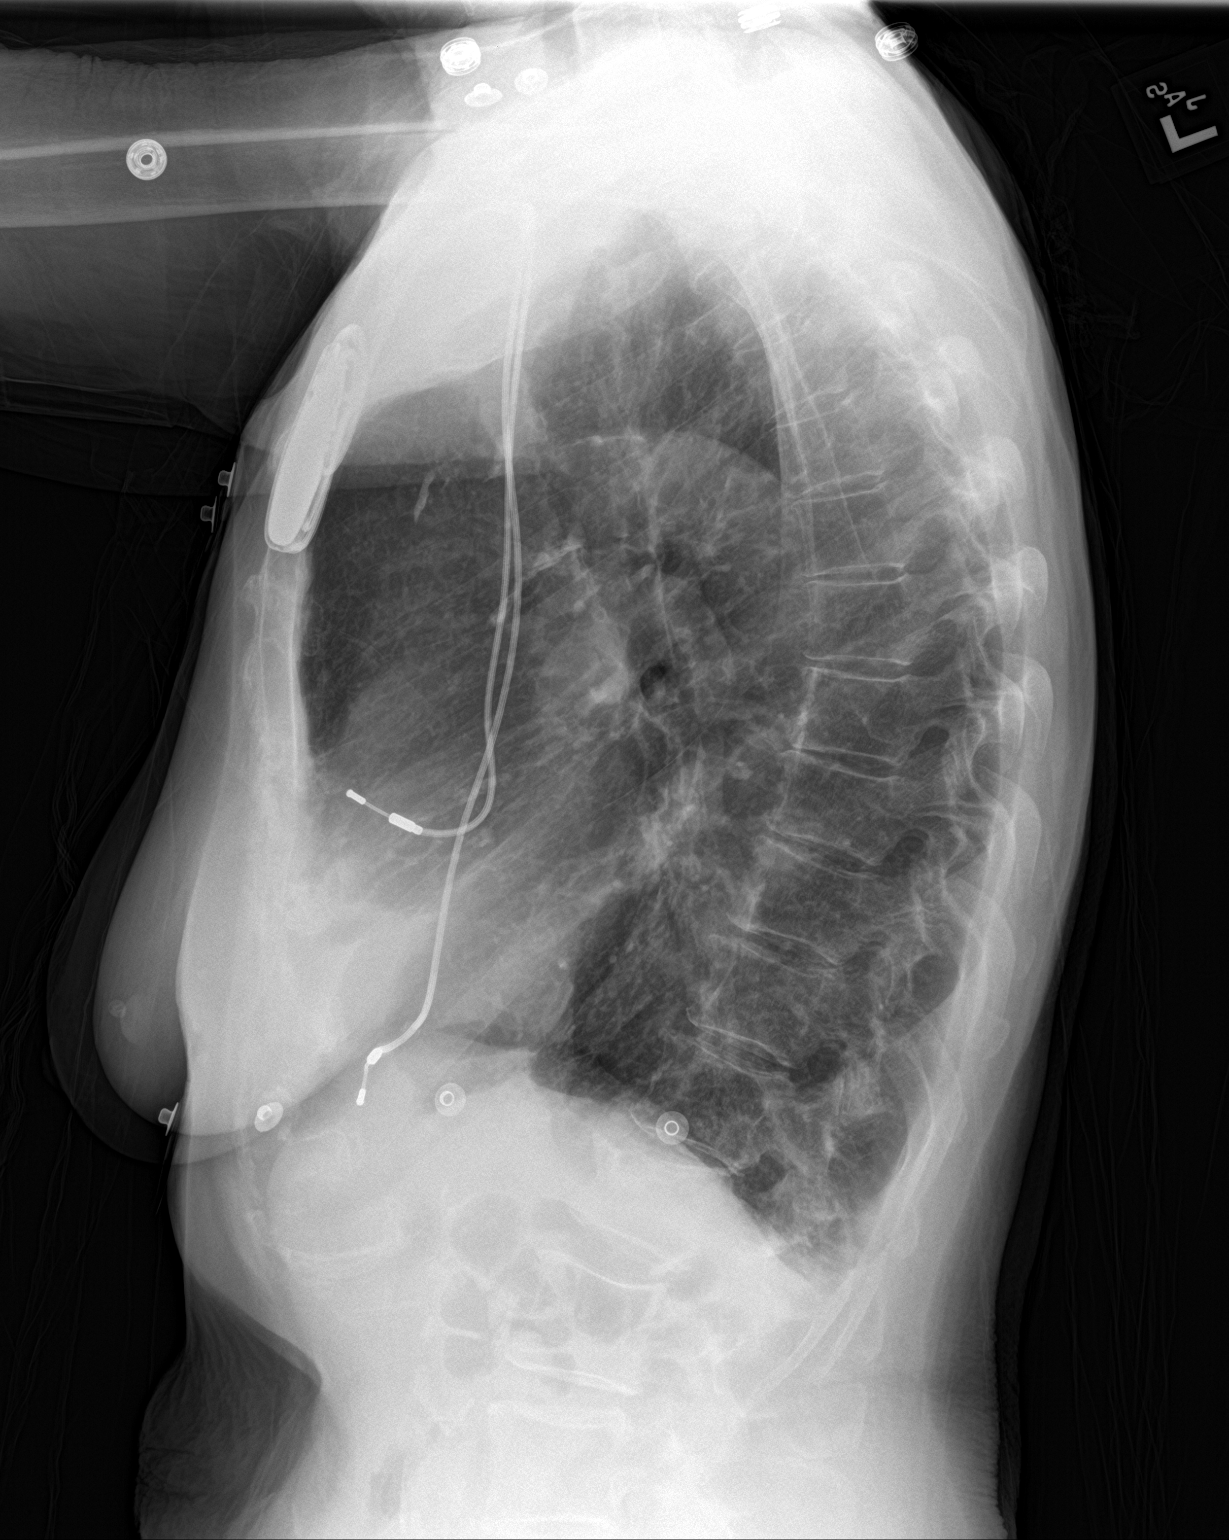

[2 of 2 positions shown; findings below may reference images not displayed]

FINDINGS: Stable cardiac silhouette within normal limits. Aortic
atherosclerosis with arch calcification. Stable linear opacities at
lung bases probably represent scarring. No focal consolidation. No
pneumothorax or effusion. COPD changes with lung hyperinflation. Two
lead pacemaker. Right axial surgical clips. Mild levocurvature and
degenerative changes of the thoracic spine.
IMPRESSION: No acute pulmonary process. Stable COPD changes and basilar
scarring.

By: Dimitry Cece M.D.

## 2018-04-25 NOTE — Progress Notes (Signed)
Remote pacemaker transmission.   

## 2018-05-02 NOTE — Telephone Encounter (Signed)
ERROR

## 2018-05-29 ENCOUNTER — Other Ambulatory Visit: Payer: Self-pay | Admitting: Cardiology

## 2018-05-29 DIAGNOSIS — I5032 Chronic diastolic (congestive) heart failure: Secondary | ICD-10-CM

## 2018-05-29 NOTE — Telephone Encounter (Signed)
Scr = 1.54 on 02/06/2018  Last OV with DR Martinique 03-10-2018  *BMET prior to next refill*  Talked to patient and she will complete BMEt in our office within next 2 months

## 2018-06-03 ENCOUNTER — Telehealth: Payer: Self-pay | Admitting: Cardiology

## 2018-06-03 NOTE — Telephone Encounter (Signed)
New Message   Pt is calling because she has questions about a paper she received for lab work. She is wondering about the BMT  Please call

## 2018-06-03 NOTE — Telephone Encounter (Signed)
Spoke to patient , she wanted know if  This lab would look at her thyroid and anemia.  RN informed patient this lab -bmp- does not look at those areas. Patient states that is fine she will come to the office have lab.she will go to primary for the others. RN SUGGEST SHE CAN DO BOTH AT THE SAMETIME. PATIENT PREFER TO KEEP THEM SEPARATE.

## 2018-06-06 ENCOUNTER — Telehealth: Payer: Self-pay | Admitting: Cardiology

## 2018-06-06 MED ORDER — METOPROLOL TARTRATE 25 MG PO TABS: 25.0000 mg | ORAL_TABLET | Freq: Two times a day (BID) | ORAL | 1 refills | Status: DC

## 2018-06-06 NOTE — Telephone Encounter (Signed)
Pt c/o BP issue: STAT if pt c/o blurred vision, one-sided weakness or slurred speech  1. What are your last 5 BP readings? 171/76  2. Are you having any other symptoms (ex. Dizziness, headache, blurred vision, passed out)? Dizziness sometime   3. What is your BP issue? BP keeping going up

## 2018-06-06 NOTE — Telephone Encounter (Signed)
Error

## 2018-06-06 NOTE — Telephone Encounter (Signed)
I would increase metoprolol to 25 mg twice a day and monitor.  Montserrath Madding Martinique MD, [facility]

## 2018-06-06 NOTE — Telephone Encounter (Signed)
° °  Please return call to patient, she was having phone trouble. Please call

## 2018-06-06 NOTE — Telephone Encounter (Signed)
Advised patient, verbalized understanding  

## 2018-06-06 NOTE — Telephone Encounter (Signed)
Spoke with patient and she has been having elevated blood pressure. She has not been checking at home on a regular basis, no change in diet. Yesterday couple hours after morning medications 173/77 and then mid-day 171/88, this morning 171/76 HR 77. She was at dental office recently and SBP 150's. She does have some dizziness at times when she bends over or goes from sitting to standing, otherwise states she feels good. Patient denies temp or shortness of breath. She does have a cramping feeling in her chest "every once in a while" but is like what she discussed with Dr Martinique in the past. Was told musculoskeletal. Episode not brought on by exertion, just happens and lasts less than a minute. Will forward to Dr Martinique for review

## 2018-06-06 NOTE — Telephone Encounter (Signed)
Unable to reach patient, no voicemail set up.

## 2018-06-11 ENCOUNTER — Telehealth: Payer: Self-pay | Admitting: Cardiology

## 2018-06-11 NOTE — Telephone Encounter (Signed)
  Patient called back to let the nurse know that her temperature has been 94-96 over the last few days.

## 2018-06-11 NOTE — Telephone Encounter (Signed)
Spoke with pt re B/P and after some discussion am readings were taken before am meds . Instructed pt to check am B/P about an hour after taking medicine and also maybe give 3 -4 more days on increase of Metoprolol. Pt verbalized understanding and will also watch salt intake.Will forward to Dr Martinique for review and recommendations . /cy

## 2018-06-11 NOTE — Telephone Encounter (Signed)
Agree lets give it 2 weeks and then let me know  Juda Lajeunesse Martinique MD, [facility]

## 2018-06-11 NOTE — Telephone Encounter (Signed)
Pt c/o BP issue: STAT if pt c/o blurred vision, one-sided weakness or slurred speech  1. What are your last 5 BP readings?  162/72  149/70 Sunday       162/82 137/74 Monday      132/64  161/77 Tuesday      16 5/73              wednesday  2. Are you having any other symptoms (ex. Dizziness, headache, blurred vision, passed out)? She just doesn't feel good. At times a little dizzy.  He states her head feels like its too full.   3. What is your BP issue? She had called last week, she was told to double her metoprolol tartrate (LOPRESSOR) 25 MG tablet since last Friday.  She feels it's not working. Or should she give it more time to work.

## 2018-06-12 NOTE — Telephone Encounter (Signed)
Spoke to patient [name]'s advice given.Stated she has not checked B/P and pulse this morning.She will call back tomorrow to report readings.

## 2018-06-12 NOTE — Telephone Encounter (Signed)
Follow Up:   Pt calling back to find out what to do about her blood pressure.She said her heart rate was a little high yesterday also.

## 2018-06-13 NOTE — Telephone Encounter (Signed)
Spoke to patient she stated her B/P this morning 157/74 pulse 74.Stated she feels ok, no complaints.She will continue to monitor B/P and call back in 2 weeks to report readings.

## 2018-06-30 ENCOUNTER — Telehealth: Payer: Self-pay | Admitting: Cardiology

## 2018-06-30 NOTE — Telephone Encounter (Signed)
  Pt c/o BP issue: STAT if pt c/o blurred vision, one-sided weakness or slurred speech  1. What are your last 5 BP readings?  4/22 144/66 P 61, 157/75 P 86 4/23 169/83 P 82, 140/76 P 80 4/24 149/72 P 75, 159/81 P 80 4/25 158/69 P 67, 143/76 P 77 4/26 143/82 P 72, 140/74 P 68  2. Are you having any other symptoms (ex. Dizziness, headache, blurred vision, passed out)? Dizziness  3. What is your BP issue? Patient was asked to track her BP for about two weeks since she was put on Metoprolol. Patient wants to know how her heart rate gets up in the 80's with her pacemaker. She also has been having sinus issues and wonders if it is coming from the medication.

## 2018-06-30 NOTE — Telephone Encounter (Signed)
Remote transmission reviewed. Normal PPM function. Presenting rhythm shows Ap/Vs rhythm at 89bpm. Histograms appropriate, lower rate programmed at 60bpm with upper sensor rate at 120bpm. 1 atrial high rate episode (0.1% mode switch burden)--EGM shows AF episode on 12/23/17, duration 5.5hrs. No ventricular arrhythmia episodes. A-paced 95.4%, V-paced 0.2%.

## 2018-06-30 NOTE — Telephone Encounter (Signed)
Returned call to patient she stated she has been dizzy.Stated her allergies are bothering her, head feels tight.She is taking flonase and salt water spray,but don't seem to be helping Advised to call PCP. Advised I will send blood pressure readings to [name] for review. Stated he pacemaker is set 69 to 70.Her heart rate has been in 80's.Advised I will let device clinic know.

## 2018-06-30 NOTE — Telephone Encounter (Signed)
Ok  Jaidon Sponsel MD, FACC   

## 2018-06-30 NOTE — Telephone Encounter (Signed)
Transmission received. 06/30/2018

## 2018-06-30 NOTE — Telephone Encounter (Signed)
I called the pt and request a manual transmission with her home monitor. I told her when the device nurse receive the transmission and take a look at it. The nurse will give her a call back. The pt agreed to send the transmission right away. The pt asked me to hold on however, the phone became disconnected. I tried to call back but no answer. I think she went into a different room to do the transmission.

## 2018-07-02 NOTE — Telephone Encounter (Signed)
Spoke to patient she stated she recently saw NP at Dr.Russo's office and she thought her dizziness is being caused by allergies.Stated she is taking Claritin.Stated yesterday she had a good day but today she is having alittle dizziness.She had lab done recently at Dr.Russo's office.Advised I will call and have them fax for [name] to review.Advised to keep virtual appointment with [name] as planned and call sooner if needed.

## 2018-07-14 ENCOUNTER — Telehealth: Payer: Self-pay | Admitting: Cardiology

## 2018-07-14 NOTE — Telephone Encounter (Signed)
New Message    Pt c/o BP issue: STAT if pt c/o blurred vision, one-sided weakness or slurred speech  1. What are your last 5 BP readings? This morning- 137/62 hr 85 Yesterday morning- 143/70 hr 73 Yesterday afternoon- 165/83 hr 79  Saturday Morning- 162/82 hr 75  Friday Morning- 166/78 hr 65  Friday Afternoon 179/ 84 hr 70  Thursday Morning- 170/88 hr 84  Thursday Afternoon- 146/73 hr 84   2. Are you having any other symptoms (ex. Dizziness, headache, blurred vision, passed out)? Some Dizziness and a headache. Pt does have allergies and thinks the symptoms are from that   3. What is your BP issue? Pt Has been taking her readings twice a day and is calling to give the information

## 2018-07-14 NOTE — Telephone Encounter (Signed)
Attributes headache and dizziness to allergies and states allergy medication helps. Pt wants to know if Dr. Martinique has any recommendations for her regarding blood pressure readings. Readings below confirmed. She states she does not want to end up having a stroke one day and would like feedback from Dr. Martinique if he has any. Informed pt that message would be routed to Dr. Martinique and his nurse, Malachy Mood, to advise. Pt agreeable with this.  This morning- BP 137/62 HR 85 Yesterday morning-BP 143/70 HR 73  Yesterday afternoon-BP 165/83 HR 79  Saturday Morning-BP 162/82 HR 75  Friday Morning-BP 166/78 HR 65  Friday Afternoon-BP 179/ 84 HR 70  Thursday Morning-BP 170/88 HR 84  Thursday Afternoon-BP 146/73 HR 84

## 2018-07-15 ENCOUNTER — Telehealth (INDEPENDENT_AMBULATORY_CARE_PROVIDER_SITE_OTHER): Payer: Medicare Other | Admitting: Physician Assistant

## 2018-07-15 ENCOUNTER — Ambulatory Visit (INDEPENDENT_AMBULATORY_CARE_PROVIDER_SITE_OTHER): Payer: Medicare Other | Admitting: *Deleted

## 2018-07-15 ENCOUNTER — Encounter: Payer: Self-pay | Admitting: *Deleted

## 2018-07-15 ENCOUNTER — Other Ambulatory Visit: Payer: Self-pay

## 2018-07-15 VITALS — BP 155/83 | HR 79 | Ht 66.0 in | Wt 122.0 lb

## 2018-07-15 DIAGNOSIS — I10 Essential (primary) hypertension: Secondary | ICD-10-CM | POA: Diagnosis not present

## 2018-07-15 DIAGNOSIS — I495 Sick sinus syndrome: Secondary | ICD-10-CM | POA: Diagnosis not present

## 2018-07-15 DIAGNOSIS — I4819 Other persistent atrial fibrillation: Secondary | ICD-10-CM

## 2018-07-15 MED ORDER — METOPROLOL TARTRATE 25 MG PO TABS: 37.5000 mg | ORAL_TABLET | Freq: Two times a day (BID) | ORAL | 1 refills | Status: DC

## 2018-07-15 NOTE — Patient Instructions (Addendum)
Your physician recommends that you schedule a follow-up appointment in: 2 WEEKS WITH DR Martinique OR HAO MENG  Your physician has recommended you make the following change in your medication:  INCREASE METOPROLOL 37.5 MG (1 AND 1/2 TABLETS) TWICE DAILY   Thank you for choosing [facility]!!

## 2018-07-15 NOTE — Progress Notes (Signed)
Virtual Visit via Telephone Note   This visit type was conducted due to national recommendations for restrictions regarding the COVID-19 Pandemic (e.g. social distancing) in an effort to limit this patient's exposure and mitigate transmission in our community.  Due to her co-morbid illnesses, this patient is at least at moderate risk for complications without adequate follow up.  This format is felt to be most appropriate for this patient at this time.  The patient did not have access to video technology/had technical difficulties with video requiring transitioning to audio format only (telephone).  All issues noted in this document were discussed and addressed.  No physical exam could be performed with this format.  Please refer to the patient's chart for her  consent to telehealth for [facility].   Date:  07/15/2018   ID:  [name] [name], DOB [id number], MRN [id number]  Patient Location: Home Provider Location: Home  PCP:  [name], MD  Cardiologist:  Peter Martinique, MD  Electrophysiologist:  None   Evaluation Performed:  Follow-Up Visit  Chief Complaint:  followup  History of Present Illness:    [name] [name] is a 85 y.o. female with aortic stenosis, hypertension, hyperlipidemia, hypothyroidism, CAD, sick sinus syndrome s/p PPM, and paroxysmal atrial fibrillation on Eliquis.  Patient had a DES to LAD October 2018.  Echocardiogram in July 2015 showed moderate aortic stenosis that has progressed since 2012.  She was placed on flecainide in June 2017 for persistent atrial fibrillation however this was later stopped due to side effect.  She was later started on amiodarone due to symptomatic atrial fibrillation despite rate control.  She had a device change out in August 2018 due to failure of RA lead.  Last cardiac catheterization in September 2018 demonstrated severe aortic stenosis with severe stenosis in the LAD.  He subsequently underwent successful DES to LAD on 12/20/2016 with 2.75  x 16 mm Promus DES.  She later underwent TAVR in November 2018 with #23 Edwards Sapien valve.  Postop course complicated by pericarditis and atrial fibrillation with RVR.  She has since been on Plavix and Eliquis combination.  Repeat echocardiogram in November 2019 showed good results.  Patient was last seen by Dr. Martinique in January 2020, she was having sharp cramps in her left precordium which was felt to be musculoskeletal in nature.  Due to recent elevation of the blood pressure, Toprol was increased to 25 mg twice daily in April.  Her last device interrogation was in April 2020, she had one episode of atrial fibrillation in October 2019 which lasted 5.5 hours, no significant ventricular arrhythmia episode.  Patient was contacted today via telephone visit for evaluation of high blood pressure.  She says since April, she has been having elevated blood pressure reading in the 130-170s.  She is feeling poorly with this blood pressure and has occasional dizziness as well.  Her dizziness is not associated with body positions and that can occur at any time.  She says she has had blood work 3 weeks ago at Dr. Keane Police office including basic metabolic panel, CBC, TSH and a free T4.  She was told her labs are normal.  Overall, she denies any recent chest pain or shortness of breath.  I will increase her metoprolol to 37.5 mg twice daily and reassess her in 2 weeks.  The patient does not have symptoms concerning for COVID-19 infection (fever, chills, cough, or new shortness of breath).    Past Medical History:  Diagnosis Date  .  Aortic stenosis, severe   . CKD (chronic kidney disease)   . Complication of anesthesia   . Coronary artery disease    a. 12/20/2016: s/p DES to LAD   . Diverticulosis   . GERD (gastroesophageal reflux disease)   . Hemorrhoids   . Hyperlipidemia   . Hypertension   . Hypothyroidism   . Osteoarthritis   . Paroxysmal atrial fibrillation (HCC)    a. on [place] with Xarelto but switched  to Eliquis after stenting.   . Presence of permanent cardiac pacemaker   . S/P TAVR (transcatheter aortic valve replacement) 01/29/2017   23 mm Edwards Sapien 3 transcatheter heart valve placed via percutaneous right transfemoral approach   . Sick sinus syndrome ([place])    a. s/p PPM (MDT)   Past Surgical History:  Procedure Laterality Date  . ABDOMINAL HYSTERECTOMY  1980  . BASAL CELL CARCINOMA EXCISION    . CARDIAC CATHETERIZATION  ~ 11/2016  . CATARACT EXTRACTION W/ INTRAOCULAR LENS  IMPLANT, BILATERAL Bilateral   . CORONARY ANGIOPLASTY WITH STENT PLACEMENT  12/20/2016   "1 stent"  . CORONARY STENT INTERVENTION N/A 12/20/2016   Procedure: CORONARY STENT INTERVENTION;  Surgeon: Martinique, Peter M, MD;  Location: [place] [facility];  Service: Cardiovascular;  Laterality: N/A;  . FINGER SURGERY Left    "fell; developed skiers thumb; had to operate on it"  . INSERT / REPLACE / [place]   for syptomatic bradycardia and syncope -- in [facility]  . LEAD REVISION/REPAIR N/A 10/09/2016   New left subclavian MDT Adapta L PPM dual chamber system implanted by Dr [name] with previously placed R subclavian system abandoned  . PACEMAKER GENERATOR CHANGE  2001   pulse generator replacement by Dr. Rollene Fare   . PACEMAKER GENERATOR CHANGE  04/01/2008   PPM Medtronic -- model # [id number] serial # [id number] -- pulse generator replacement by Dr. Verlon Setting   . RIGHT/LEFT HEART CATH AND CORONARY ANGIOGRAPHY N/A 11/27/2016   Procedure: RIGHT/LEFT HEART CATH AND CORONARY ANGIOGRAPHY;  Surgeon: Martinique, Peter M, MD;  Location: [place] [facility];  Service: Cardiovascular;  Laterality: N/A;  . SQUAMOUS CELL CARCINOMA EXCISION    . TEE WITHOUT CARDIOVERSION N/A 01/29/2017   Procedure: TRANSESOPHAGEAL ECHOCARDIOGRAM (TEE);  Surgeon: Sherren Mocha, MD;  Location: [facility];  Service: Open Heart Surgery;  Laterality: N/A;  . TONSILLECTOMY    . TRANSCATHETER AORTIC VALVE REPLACEMENT, TRANSFEMORAL N/A 01/29/2017    Procedure: TRANSCATHETER AORTIC VALVE REPLACEMENT, TRANSFEMORAL;  Surgeon: Sherren Mocha, MD;  Location: [place];  Service: Open Heart Surgery;  Laterality: N/A;     Current Meds  Medication Sig  . acetaminophen (TYLENOL) 500 MG tablet Take 1,000 mg by mouth every 4 (four) hours as needed for moderate pain or headache.   Marland Kitchen amiodarone (PACERONE) 200 MG tablet Take 0.5 tablets (100 mg total) by mouth daily.  Marland Kitchen amLODipine (NORVASC) 5 MG tablet Take 1 tablet (5 mg total) by mouth daily.  . calcium carbonate (OS-CAL) 600 MG TABS Take 600 mg by mouth 2 (two) times daily with a meal.   . clindamycin (CLEOCIN) 300 MG capsule Take 2 tablets by mouth one hour prior to any dental appointments and stomach or bladder procedure.  . dicyclomine (BENTYL) 10 MG capsule Take 10 mg by mouth 2 (two) times daily as needed for spasms.   Marland Kitchen ELIQUIS 2.5 MG TABS tablet TAKE 1 TABLET(2.5 MG) BY MOUTH TWICE DAILY  . ezetimibe (ZETIA) 10 MG tablet Take 1 tablet (10 mg  total) by mouth daily.  . furosemide (LASIX) 20 MG tablet Take 20 mg every other day  . levothyroxine (SYNTHROID, LEVOTHROID) 112 MCG tablet Take 112 mcg by mouth daily before breakfast.   . loratadine (CLARITIN) 10 MG tablet Take 10 mg by mouth daily as needed for allergies.  . metoprolol tartrate (LOPRESSOR) 25 MG tablet Take 1 tablet (25 mg total) by mouth 2 (two) times daily.  . nitroGLYCERIN (NITROSTAT) 0.4 MG SL tablet Place 0.4 mg under the tongue every 5 (five) minutes as needed for chest pain.  . pantoprazole (PROTONIX) 40 MG tablet Take 1 tablet (40 mg total) by mouth daily.  . polyvinyl alcohol (LIQUIFILM TEARS) 1.4 % ophthalmic solution Place 1-2 drops into both eyes 3 (three) times daily as needed for dry eyes.  . sodium chloride (OCEAN) 0.65 % SOLN nasal spray Place 1 spray into both nostrils as needed for congestion.     Allergies:   Penicillins; Tetanus toxoid; Demerol; and Codeine   Social History   Tobacco Use  . Smoking status:  Former Smoker    Packs/day: 1.50    Years: 30.00    Pack years: 45.00    Types: Cigarettes    Last attempt to quit: 03/05/1978    Years since quitting: 40.3  . Smokeless tobacco: Never Used  Substance Use Topics  . Alcohol use: No  . Drug use: No     Family Hx: The patient's family history includes Alzheimer's disease in her mother; Cancer in her father.  ROS:   Please see the history of present illness.    All other systems reviewed and are negative.   Prior CV studies:   The following studies were reviewed today:  Echo 01/22/2018 LV EF: 60% -   65%  ------------------------------------------------------------------- Indications:      Aortic Stenosis (I35.0).  ------------------------------------------------------------------- History:   PMH:   Atrial fibrillation.  Coronary artery disease. Risk factors:  TAVR (01-29-17, 27mm Edwards), Chronic Kidney Disease. Hypertension. Dyslipidemia.  ------------------------------------------------------------------- Study Conclusions  - Left ventricle: The cavity size was normal. Wall thickness was   normal. Systolic function was normal. The estimated ejection   fraction was in the range of 60% to 65%. Wall motion was normal;   there were no regional wall motion abnormalities. Doppler   parameters are consistent with abnormal left ventricular   relaxation (grade 1 diastolic dysfunction). - Aortic valve: A bioprosthesis was present and functioning   normally. There was mild perivalvular regurgitation in the 2   o&'clock position. Peak velocity (S): 221 cm/s. Mean gradient (S):   9 mm Hg. Peak gradient (S): 20 mm Hg. - Mitral valve: There was mild regurgitation. - Left atrium: The atrium was mildly dilated. - Right ventricle: Pacer wire or catheter noted in right ventricle. - Tricuspid valve: There was mild regurgitation. - Pulmonary arteries: Systolic pressure was mildly increased. PA   peak pressure: 37 mm Hg (S).   Labs/Other Tests and Data Reviewed:    EKG:  An ECG dated 03/10/2018 was personally reviewed today and demonstrated:  Atrial paced rhythm.  Recent Labs: 02/05/2018: ALT 9; BUN 29; Creatinine, Ser 1.54; Hemoglobin 11.6; Platelets 182; Potassium 4.8; Sodium 138; TSH 9.620   Recent Lipid Panel Lab Results  Component Value Date/Time   CHOL 219 (H) 02/05/2018 02:40 PM   TRIG 70 02/05/2018 02:40 PM   HDL 103 02/05/2018 02:40 PM   CHOLHDL 2.1 02/05/2018 02:40 PM   LDLCALC 102 (H) 02/05/2018 02:40 PM    Wt Readings  from Last 3 Encounters:  07/15/18 122 lb (55.3 kg)  03/10/18 125 lb 6.4 oz (56.9 kg)  02/05/18 126 lb 6.4 oz (57.3 kg)     Objective:    Vital Signs:  BP (!) 155/83   Pulse 79   Ht 5\' 6"  (1.676 m)   Wt 122 lb (55.3 kg)   BMI 19.69 kg/m    VITAL SIGNS:  reviewed  ASSESSMENT & PLAN:    1. Uncontrolled hypertension: We will increase metoprolol to 37.5 mg twice daily.  Reassess in 2 weeks.  2. Hyperlipidemia: Continue Zetia  3. Hypothyroidism: Managed by primary care provider.  4. CAD: Denies any recent anginal symptoms.  5. Sick sinus syndrome s/p PPM: Last device interrogation was stable.  6. Paroxysmal atrial fibrillation: On Eliquis.  Rate controlled on metoprolol.  Also on amiodarone 100 mg daily  COVID-19 Education: The signs and symptoms of COVID-19 were discussed with the patient and how to seek care for testing (follow up with PCP or arrange E-visit).  The importance of social distancing was discussed today.  Time:   Today, I have spent 17 minutes with the patient with telehealth technology discussing the above problems.     Medication Adjustments/Labs and Tests Ordered: Current medicines are reviewed at length with the patient today.  Concerns regarding medicines are outlined above.   Tests Ordered: No orders of the defined types were placed in this encounter.   Medication Changes: No orders of the defined types were placed in this encounter.    Disposition:  Follow up in 2 week(s)  Signed, Almyra Deforest, PA  07/15/2018 2:53 PM    [facility]

## 2018-07-15 NOTE — Telephone Encounter (Signed)
Patient called and asked to speak with [name] [name] about her BP issues.

## 2018-07-15 NOTE — Telephone Encounter (Signed)
Spoke to patient she stated her B/P is up and down.Stated she feels awful.Advised [name] out of office.Virtual visit scheduled with Almyra Deforest PA today at 3:00 pm.Patient gave permission for a virtual visit.

## 2018-07-16 ENCOUNTER — Telehealth: Payer: Self-pay

## 2018-07-16 LAB — CUP PACEART REMOTE DEVICE CHECK
Battery Impedance: 113 Ohm
Battery Remaining Longevity: 132 mo
Battery Voltage: 2.78 V
Brady Statistic AP VP Percent: 0 %
Brady Statistic AP VS Percent: 96 %
Brady Statistic AS VP Percent: 0 %
Brady Statistic AS VS Percent: 4 %
Date Time Interrogation Session: 20200513125226
Implantable Lead Implant Date: 20180807
Implantable Lead Implant Date: 20180807
Implantable Lead Location: 753859
Implantable Lead Location: 753860
Implantable Lead Model: 5076
Implantable Lead Model: 5076
Implantable Pulse Generator Implant Date: 20180807
Lead Channel Impedance Value: 329 Ohm
Lead Channel Impedance Value: 567 Ohm
Lead Channel Pacing Threshold Amplitude: 0.625 V
Lead Channel Pacing Threshold Amplitude: 0.75 V
Lead Channel Pacing Threshold Pulse Width: 0.4 ms
Lead Channel Pacing Threshold Pulse Width: 0.4 ms
Lead Channel Setting Pacing Amplitude: 2 V
Lead Channel Setting Pacing Amplitude: 2.5 V
Lead Channel Setting Pacing Pulse Width: 0.4 ms
Lead Channel Setting Sensing Sensitivity: 5.6 mV

## 2018-07-16 NOTE — Telephone Encounter (Signed)
I have seen the patient and made recommendation. Thank you

## 2018-07-16 NOTE — Telephone Encounter (Signed)
Spoke with patient to remind of missed remote transmission 

## 2018-07-30 NOTE — Progress Notes (Signed)
Remote pacemaker transmission.   

## 2018-08-05 ENCOUNTER — Telehealth: Payer: Self-pay | Admitting: Cardiology

## 2018-08-05 NOTE — Progress Notes (Signed)
Virtual Visit via Video Note   This visit type was conducted due to national recommendations for restrictions regarding the COVID-19 Pandemic (e.g. social distancing) in an effort to limit this patient's exposure and mitigate transmission in our community.  Due to her co-morbid illnesses, this patient is at least at moderate risk for complications without adequate follow up.  This format is felt to be most appropriate for this patient at this time.  All issues noted in this document were discussed and addressed.  A limited physical exam was performed with this format.  Please refer to the patient's chart for her consent to telehealth for [facility].   Date:  08/08/2018   ID:  [name] [name], DOB 29-Feb-1930, MRN [id number]  Patient Location: Home Provider Location: Home  PCP:  [name], MD  Cardiologist:  Ketsia Linebaugh Martinique, MD Electrophysiologist:  None   Evaluation Performed:  Follow-Up Visit  Chief Complaint:  Follow up HTN  History of Present Illness:    [name] [name] is a 85 y.o. female seen for follow up HTN. She is s/p  PCI with DES to LAD on December 20, 2016.   She has a history of paroxysmal atrial fibrillation with tachy-brady syndrome and is s/p pacemaker implant. She is on chronic anticoagulation with Eliquis. Echo in July 2015 showed moderate aortic stenosis that had progressed since 2012. Repeat in July 2016 and July 2017 showed no change. In April 2017 she had atrial fibrillation that converted spontaneously. This was felt to be triggered by a steroid injection. Her metoprolol was increased at that time. When seen in June 2017 she was noted to be in persistent Afib. We placed her on Flecainide but this was later stopped due to side effects. She was seen in November with recurrent and persistent Afib. Rate was controlled but she was symptomatic. Seen in Afib clinic and started on amiodarone with subsequent conversion to NSR.   When seen previously she complained  of  SOB with  activity- this was present even before amiodarone and hasn't improved with restoration of NSR.   She did have a upper and lower EGD which was negative except for few polyps. She had PFTs in December that showed mild obstructive defect with marked decrease in diffusion capacity. CT of the Abdomen was negative for any pathology. She reports Dr. Virgina Jock did an [place] as well.  Myoview study was normal. When seen by Dr. [name] in late July it was noted that she had failure of her RA lead. Her device was reprogrammed but she developed pectoral stimulation. She underwent explantation of her old generator and placement of a new left chest dual chamber pacemaker on October 09 2016. Because of her ongoing symptoms we recommended a right and left heart cath and this was done on 11/27/16. This demonstrated severe AS as well as a severe stenosis in the mid LAD. She subsequently underwent successful PCI of the LAD with DES on 12/20/16 with 2.75 x 16 mm Promus stent.   She underwent TAVR on November 27,2018  with a #23 Edwards Sapien valve. Post op course complicated by pericarditis and AFib with RVR she was treated with anti-inflammatories and Amiodarone. Later Eliquis resumed and ASA stopped. Still on Plavix for stent. Follow up Echo was [place]. Repeat Echo 01/22/18 still showed good results. Pacemaker check on 12/12/17 showed normal function without atrial high rate episodes. When seen in the valve clinic on 01/22/18 Plavix was discontinued.   She was seen by me in January  and was doing well. In April she began to have elevated BP  Readings and metoprolol dose was increased. Had a virtual visit with Almyra Deforest PA-C on 07/15/18 and metoprolol increased again. Since then she has noted that BP has improved. Typically readings are 258-527P systolic. Previously they were up to 170. She feels well. Notes mild lightheadedness with activity at times. No dyspnea or chest pain. No palpitations or Afib.   The patient does not have symptoms  concerning for COVID-19 infection (fever, chills, cough, or new shortness of breath).    Past Medical History:  Diagnosis Date   Aortic stenosis, severe    CKD (chronic kidney disease)    Complication of anesthesia    Coronary artery disease    a. 12/20/2016: s/p DES to LAD    Diverticulosis    GERD (gastroesophageal reflux disease)    Hemorrhoids    Hyperlipidemia    Hypertension    Hypothyroidism    Osteoarthritis    Paroxysmal atrial fibrillation ([place])    a. on [place] with Xarelto but switched to Eliquis after stenting.    Presence of permanent cardiac pacemaker    S/P TAVR (transcatheter aortic valve replacement) 01/29/2017   23 mm Edwards Sapien 3 transcatheter heart valve placed via percutaneous right transfemoral approach    Sick sinus syndrome ([place])    a. s/p PPM (MDT)   Past Surgical History:  Procedure Laterality Date   ABDOMINAL HYSTERECTOMY  1980   BASAL CELL CARCINOMA EXCISION     CARDIAC CATHETERIZATION  ~ 11/2016   CATARACT EXTRACTION W/ INTRAOCULAR LENS  IMPLANT, BILATERAL Bilateral    CORONARY ANGIOPLASTY WITH STENT PLACEMENT  12/20/2016   "1 stent"   CORONARY STENT INTERVENTION N/A 12/20/2016   Procedure: CORONARY STENT INTERVENTION;  Surgeon: Martinique, Yanet Balliet M, MD;  Location: [place] [facility];  Service: Cardiovascular;  Laterality: N/A;   FINGER SURGERY Left    "fell; developed skiers thumb; had to operate on it"   INSERT / REPLACE / [place]   for syptomatic bradycardia and syncope -- in [place] REVISION/REPAIR N/A 10/09/2016   New left subclavian MDT Adapta L PPM dual chamber system implanted by Dr [name] with previously placed R subclavian system abandoned   PACEMAKER GENERATOR CHANGE  2001   pulse generator replacement by Dr. Pincus Large GENERATOR CHANGE  04/01/2008   PPM Medtronic -- model # [id number] serial # [id number] -- pulse generator replacement by Dr. Verlon Setting    RIGHT/LEFT [place] N/A 11/27/2016   Procedure: RIGHT/LEFT HEART CATH AND CORONARY ANGIOGRAPHY;  Surgeon: Martinique, Ronasia Isola M, MD;  Location: [place] [facility];  Service: Cardiovascular;  Laterality: N/A;   SQUAMOUS CELL CARCINOMA EXCISION     TEE WITHOUT CARDIOVERSION N/A 01/29/2017   Procedure: TRANSESOPHAGEAL ECHOCARDIOGRAM (TEE);  Surgeon: Sherren Mocha, MD;  Location: [facility];  Service: Open Heart Surgery;  Laterality: N/A;   TONSILLECTOMY     TRANSCATHETER AORTIC VALVE REPLACEMENT, TRANSFEMORAL N/A 01/29/2017   Procedure: TRANSCATHETER AORTIC VALVE REPLACEMENT, TRANSFEMORAL;  Surgeon: Sherren Mocha, MD;  Location: [place];  Service: Open Heart Surgery;  Laterality: N/A;     Current Meds  Medication Sig   acetaminophen (TYLENOL) 500 MG tablet Take 1,000 mg by mouth every 4 (four) hours as needed for moderate pain or headache.    amiodarone (PACERONE) 200 MG tablet Take 0.5 tablets (100 mg total) by mouth daily.   amLODipine (NORVASC) 5 MG  tablet Take 1 tablet (5 mg total) by mouth daily.   calcium carbonate (OS-CAL) 600 MG TABS Take 600 mg by mouth 2 (two) times daily with a meal.    clindamycin (CLEOCIN) 300 MG capsule Take 2 tablets by mouth one hour prior to any dental appointments and stomach or bladder procedure.   dicyclomine (BENTYL) 10 MG capsule Take 10 mg by mouth 2 (two) times daily as needed for spasms.    ELIQUIS 2.5 MG TABS tablet TAKE 1 TABLET(2.5 MG) BY MOUTH TWICE DAILY   ezetimibe (ZETIA) 10 MG tablet Take 1 tablet (10 mg total) by mouth daily.   furosemide (LASIX) 20 MG tablet Take 20 mg every other day   levothyroxine (SYNTHROID, LEVOTHROID) 112 MCG tablet Take 112 mcg by mouth daily before breakfast.    loratadine (CLARITIN) 10 MG tablet Take 10 mg by mouth daily as needed for allergies.   metoprolol tartrate (LOPRESSOR) 25 MG tablet Take 1.5 tablets (37.5 mg total) by mouth 2 (two) times daily.   nitroGLYCERIN (NITROSTAT) 0.4 MG SL tablet Place 0.4 mg  under the tongue every 5 (five) minutes as needed for chest pain.   pantoprazole (PROTONIX) 40 MG tablet Take 1 tablet (40 mg total) by mouth daily.   polyvinyl alcohol (LIQUIFILM TEARS) 1.4 % ophthalmic solution Place 1-2 drops into both eyes 3 (three) times daily as needed for dry eyes.   sodium chloride (OCEAN) 0.65 % SOLN nasal spray Place 1 spray into both nostrils as needed for congestion.     Allergies:   Penicillins; Tetanus toxoid; Demerol; and Codeine   Social History   Tobacco Use   Smoking status: Former Smoker    Packs/day: 1.50    Years: 30.00    Pack years: 45.00    Types: Cigarettes    Last attempt to quit: 03/05/1978    Years since quitting: 40.4   Smokeless tobacco: Never Used  Substance Use Topics   Alcohol use: No   Drug use: No     Family Hx: The patient's family history includes Alzheimer's disease in her mother; Cancer in her father.  ROS:   Please see the history of present illness.   She is receiving some type of external laser treatment for her knee and this seems to help. All other systems reviewed and are negative.   Prior CV studies:   The following studies were reviewed today:  Echo 01/22/2018 LV EF: 60% - 65%  ------------------------------------------------------------------- Indications: Aortic Stenosis (I35.0).  ------------------------------------------------------------------- History: PMH: Atrial fibrillation. Coronary artery disease. Risk factors: TAVR (01-29-17, 21mm Edwards), Chronic Kidney Disease. Hypertension. Dyslipidemia.  ------------------------------------------------------------------- Study Conclusions  - Left ventricle: The cavity size was normal. Wall thickness was normal. Systolic function was normal. The estimated ejection fraction was in the range of 60% to 65%. Wall motion was normal; there were no regional wall motion abnormalities. Doppler parameters are consistent with  abnormal left ventricular relaxation (grade 1 diastolic dysfunction). - Aortic valve: A bioprosthesis was present and functioning normally. There was mild perivalvular regurgitation in the 2 o&'clock position. Peak velocity (S): 221 cm/s. Mean gradient (S): 9 mm Hg. Peak gradient (S): 20 mm Hg. - Mitral valve: There was mild regurgitation. - Left atrium: The atrium was mildly dilated. - Right ventricle: Pacer wire or catheter noted in right ventricle. - Tricuspid valve: There was mild regurgitation. - Pulmonary arteries: Systolic pressure was mildly increased. PA peak pressure: 37 mm Hg (S).  Labs/Other Tests and Data Reviewed:    EKG:  No ECG reviewed.  Recent Labs: 02/05/2018: ALT 9; BUN 29; Creatinine, Ser 1.54; Hemoglobin 11.6; Platelets 182; Potassium 4.8; Sodium 138; TSH 9.620   Recent Lipid Panel Lab Results  Component Value Date/Time   CHOL 219 (H) 02/05/2018 02:40 PM   TRIG 70 02/05/2018 02:40 PM   HDL 103 02/05/2018 02:40 PM   CHOLHDL 2.1 02/05/2018 02:40 PM   LDLCALC 102 (H) 02/05/2018 02:40 PM   Dated 06/30/18: creatinine 1.4. Hgb 12.1. glucose 164. TSH 2.33  Wt Readings from Last 3 Encounters:  08/08/18 122 lb (55.3 kg)  07/15/18 122 lb (55.3 kg)  03/10/18 125 lb 6.4 oz (56.9 kg)     Objective:    Vital Signs:  BP (!) 149/72    Pulse 75    Temp 98 F (36.7 C)    Ht 5\' 6"  (1.676 m)    Wt 122 lb (55.3 kg)    BMI 19.69 kg/m    VITAL SIGNS:  reviewed  General elderly female in no distress HEENT. EOMI. Sclera are clear. Respirations are unlabored. Skin dry Neuro alert and oriented x 3. nonfocal Mood normal  ASSESSMENT & PLAN:    1.  Atrial fibrillation with sick sinus syndrome. Prior history of  persistent AFib.  Now on amiodarone 100 mg daily. Maintaining NSR. Last pacer check 2 weeks ago without significant arrhythmia.  Continue long-term anticoagulation.   2. Aortic stenosis-   symptomatic. Now s/p TAVR. She has made an excellent  recovery.  Follow up Echo  OK. Only mild perivalvular leak.  3. CAD with significant single vessel obstructive disease in the mid LAD. S/p PCI of the LAD with DES October 2018.  On Eliquis. Now off Plavix.   4. HTN- control has improved significantly with increase in metoprolol. Will continue current therapy and monitor.  5. Hyperlipidemia. Intolerant of statins. Now on Zetia 10 mg daily with improved LDL from 145>>102.   6. Chronic diastolic CHF. She is well compensated. Weight is down Continue  lasix  5x/wk.   7. CKD stage 3. Improved on last labs.   COVID-19 Education: The signs and symptoms of COVID-19 were discussed with the patient and how to seek care for testing (follow up with PCP or arrange E-visit).  The importance of social distancing was discussed today.  Time:   Today, I have spent 20 minutes with the patient with telehealth technology discussing the above problems.     Medication Adjustments/Labs and Tests Ordered: Current medicines are reviewed at length with the patient today.  Concerns regarding medicines are outlined above.   Tests Ordered: No orders of the defined types were placed in this encounter.   Medication Changes: No orders of the defined types were placed in this encounter.   Disposition:  Follow up in 4 month(s)  Signed, Markavious Micco Martinique, MD  08/08/2018 9:50 AM    [facility]

## 2018-08-06 NOTE — Telephone Encounter (Signed)
For video set up ,call neighbor's phone [phone](Annette)/home phone/ my chart declined/ consent/ pre reg completed

## 2018-08-08 ENCOUNTER — Telehealth (INDEPENDENT_AMBULATORY_CARE_PROVIDER_SITE_OTHER): Payer: Medicare Other | Admitting: Cardiology

## 2018-08-08 ENCOUNTER — Encounter: Payer: Self-pay | Admitting: Cardiology

## 2018-08-08 ENCOUNTER — Ambulatory Visit: Payer: Medicare Other | Admitting: Cardiology

## 2018-08-08 VITALS — BP 149/72 | HR 75 | Temp 98.0°F | Ht 66.0 in | Wt 122.0 lb

## 2018-08-08 DIAGNOSIS — I480 Paroxysmal atrial fibrillation: Secondary | ICD-10-CM | POA: Diagnosis not present

## 2018-08-08 DIAGNOSIS — I350 Nonrheumatic aortic (valve) stenosis: Secondary | ICD-10-CM | POA: Diagnosis not present

## 2018-08-08 DIAGNOSIS — Z950 Presence of cardiac pacemaker: Secondary | ICD-10-CM

## 2018-08-08 DIAGNOSIS — I10 Essential (primary) hypertension: Secondary | ICD-10-CM

## 2018-08-08 DIAGNOSIS — I2510 Atherosclerotic heart disease of native coronary artery without angina pectoris: Secondary | ICD-10-CM

## 2018-08-08 DIAGNOSIS — Z952 Presence of prosthetic heart valve: Secondary | ICD-10-CM

## 2018-08-08 NOTE — Patient Instructions (Signed)
Medication Instructions:  Continue same medications If you need a refill on your cardiac medications before your next appointment, please call your pharmacy.   Lab work: None ordered   Testing/Procedures: None ordered  Follow-Up: At [facility], you and your health needs are our priority.  As part of our continuing mission to provide you with exceptional heart care, we have created designated Provider Care Teams.  These Care Teams include your primary Cardiologist (physician) and Advanced Practice Providers (APPs -  Physician Assistants and Nurse Practitioners) who all work together to provide you with the care you need, when you need it. . Schedule follow appointment in 4 months     Call 3 months before to schedule

## 2018-08-20 ENCOUNTER — Telehealth: Payer: Self-pay | Admitting: Cardiology

## 2018-08-20 DIAGNOSIS — I10 Essential (primary) hypertension: Secondary | ICD-10-CM

## 2018-08-20 MED ORDER — LOSARTAN POTASSIUM 25 MG PO TABS: 25.0000 mg | ORAL_TABLET | Freq: Every day | ORAL | 3 refills | Status: DC

## 2018-08-20 NOTE — Telephone Encounter (Signed)
  Pt c/o BP issue: STAT if pt c/o blurred vision, one-sided weakness or slurred speech  1. What are your last 5 BP readings?  5/14 158/74 p77 5/15 148/78 p 65 5/16 172/79 p 66     174/99 p 81 5/17 166/85 p 79  2. Are you having any other symptoms (ex. Dizziness, headache, blurred vision, passed out)? Slight headache, hearing pulse in right ear  3. What is your BP issue? Patient is concerned her bp is too high and she is hearing her pulse in her right ear and has a slight headache.

## 2018-08-20 NOTE — Addendum Note (Signed)
Addended by: Jonathon Jordan on: 08/20/2018 09:53 AM   Modules accepted: Orders

## 2018-08-20 NOTE — Telephone Encounter (Signed)
I would recommend adding losartan 25 mg daily for BP. Let's check a BMET on Monday.   Dim Meisinger Martinique MD, [facility]

## 2018-08-20 NOTE — Telephone Encounter (Signed)
The dates for provided bp/p are from this week 6/14 through 6/17. Morning bp results are before her morning meds. Pt feels pain in back and knees are contributing to elevated bp, pain 5/10 but does not take anything for pain, encouraged to take tylenol. Pt c/o light HA and slight buzzing/ beeping in right ear with each heart beat, pt states she does have chronic nasal congestion.    Pt would prefer that Dr Martinique addresses the bp issue but is aware that DR/Nurse are off today. I will forward to him and see if there is a response but around noon I will see if APP Jory Sims will address bp issue, pt would like it addressed today if it can be, he birthday is tomorrow and is having family visit and would prefer not to address it while she has visitors.

## 2018-08-20 NOTE — Telephone Encounter (Signed)
Pt was so happy and appreciative to get further instruction from Dr Martinique, losartan 25 mg sent to pharmacy to will be started today and pt will come Monday 08/25/2018 for Bmet. Pt verbalized understanding.

## 2018-08-25 LAB — BASIC METABOLIC PANEL
BUN/Creatinine Ratio: 22 (ref 12–28)
BUN: 29 mg/dL — ABNORMAL HIGH (ref 8–27)
CO2: 26 mmol/L (ref 20–29)
Calcium: 9.8 mg/dL (ref 8.7–10.3)
Chloride: 99 mmol/L (ref 96–106)
Creatinine, Ser: 1.32 mg/dL — ABNORMAL HIGH (ref 0.57–1.00)
GFR calc Af Amer: 42 mL/min/{1.73_m2} — ABNORMAL LOW (ref >59)
GFR calc non Af Amer: 36 mL/min/{1.73_m2} — ABNORMAL LOW (ref >59)
Glucose: 64 mg/dL — ABNORMAL LOW (ref 65–99)
POTASSIUM: 4.4 mmol/L (ref 3.5–5.2)
Sodium: 139 mmol/L (ref 134–144)

## 2018-08-27 ENCOUNTER — Telehealth: Payer: Self-pay

## 2018-08-27 MED ORDER — NITROGLYCERIN 0.4 MG SL SUBL: 0.4000 mg | SUBLINGUAL_TABLET | SUBLINGUAL | 12 refills | Status: DC | PRN

## 2018-08-27 NOTE — Telephone Encounter (Signed)
Spoke to [place] from [place] who report pt need an updated order for [employer]. Verified pharmacy and informed new Rx will be sent.

## 2018-08-28 ENCOUNTER — Other Ambulatory Visit: Payer: Self-pay | Admitting: Cardiology

## 2018-08-29 NOTE — Telephone Encounter (Signed)
Pt is a 85 yr old female that had a telemed appt on 08/08/18 with Dr. Martinique. Her last weight was 56.9Kg on 03/10/18. SCr on 08/25/18 was 1.32. Due to specified criteria will refill Eliquis 2.5mg  BID.

## 2018-09-09 ENCOUNTER — Ambulatory Visit: Payer: Medicare Other | Admitting: Physical Therapy

## 2018-09-12 NOTE — Telephone Encounter (Signed)
Opened in error

## 2018-10-01 ENCOUNTER — Telehealth: Payer: Self-pay | Admitting: Cardiology

## 2018-10-01 NOTE — Telephone Encounter (Signed)
Spoke with pt who states that her issues not urgent and that she is aware that Elly Modena is off today so she would like telephone encounter to be routed to her so that she can call her back when in the office. Informed pt that message has been routed. Pt verbalized understanding

## 2018-10-01 NOTE — Telephone Encounter (Signed)
°  The patient called this morning to talk to Malachy Mood to talk about some dizzy spells she is having.  STAT if patient feels like he/she is going to faint   1) Are you dizzy now? no  2) Do you feel faint or have you passed out? no  3) Do you have any other symptoms? no  4) Have you checked your HR and BP (record if available)? Not on her. She does have record though.   The patient just had some concerns and would really just like to speak with Malachy Mood. These symptoms have been going on for a while, and Malachy Mood is familiar with the patient's situation.   Nothing is urgent. The patient said she can wait for Malachy Mood to call her tomorrow

## 2018-10-02 NOTE — Progress Notes (Addendum)
Cardiology Office Note:    Date:  10/03/2018   ID:  [name], [name] 02-04-1930, MRN [id number]  PCP:  [name], MD  Cardiologist:  Peter Martinique, MD   Referring MD: [name], MD   Chief Complaint  Patient presents with  . Dizziness    History of Present Illness:    [name] [name] is a 85 y.o. female with a hx of SSS wit PPM in place, PAF on eliquis (failed flecainide for SE, converted on amiodarone) , severe AS s/p TAVR complicated by pericarditis, HTN, CAD s/p DES to LAD (2018), chronic diastolic heart failure, GERD, HLD, and CKD. Following her stent, ASA was stopped and plavix and eliquis continued. Plavix was eventually stopped. She was last seen in clinic by Dr. Martinique on 08/08/18. She was doing well at that time. In the interim, Dr. Martinique added losartan to her regimen for better BP control. Follow up BMP was stable   She was added to my schedule for recent dizzy spells. She states the dizziness is not new, but has increased. She describes sudden onset dizziness, not associated with changes in position. The last episode was when she was sitting on the couch watching TV. She feels like she is going to "fade away."  The episode lasts a few seconds to minutes. The episodes are brief, but then she feels poorly for the rest of the day. She feels like the skin on her head is too tight and hurts. She also has sinus issues and congestion. She has seen ENT, but no help with her dizziness. She also reports vetigo, although ENT says she doesn't have vertigo.  She denies sudden vision changes. She does have glaucoma.  She asks about her carotid arteries. She denies syncope.   Past Medical History:  Diagnosis Date  . Aortic stenosis, severe   . CKD (chronic kidney disease)   . Complication of anesthesia   . Coronary artery disease    a. 12/20/2016: s/p DES to LAD   . Diverticulosis   . GERD (gastroesophageal reflux disease)   . Hemorrhoids   . Hyperlipidemia   . Hypertension   .  Hypothyroidism   . Osteoarthritis   . Paroxysmal atrial fibrillation (HCC)    a. on Brigantine with Xarelto but switched to Eliquis after stenting.   . Presence of permanent cardiac pacemaker   . S/P TAVR (transcatheter aortic valve replacement) 01/29/2017   23 mm Edwards Sapien 3 transcatheter heart valve placed via percutaneous right transfemoral approach   . Sick sinus syndrome ([place])    a. s/p PPM (MDT)    Past Surgical History:  Procedure Laterality Date  . ABDOMINAL HYSTERECTOMY  1980  . BASAL CELL CARCINOMA EXCISION    . CARDIAC CATHETERIZATION  ~ 11/2016  . CATARACT EXTRACTION W/ INTRAOCULAR LENS  IMPLANT, BILATERAL Bilateral   . CORONARY ANGIOPLASTY WITH STENT PLACEMENT  12/20/2016   "1 stent"  . CORONARY STENT INTERVENTION N/A 12/20/2016   Procedure: CORONARY STENT INTERVENTION;  Surgeon: Martinique, Peter M, MD;  Location: [facility];  Service: Cardiovascular;  Laterality: N/A;  . FINGER SURGERY Left    "fell; developed skiers thumb; had to operate on it"  . INSERT / REPLACE / [place]   for syptomatic bradycardia and syncope -- in [facility]  . LEAD REVISION/REPAIR N/A 10/09/2016   New left subclavian MDT Adapta L PPM dual chamber system implanted by Dr [name] with previously placed R subclavian system abandoned  .  PACEMAKER GENERATOR CHANGE  2001   pulse generator replacement by Dr. Rollene Fare   . PACEMAKER GENERATOR CHANGE  04/01/2008   PPM Medtronic -- model # [id number] serial # [id number] -- pulse generator replacement by Dr. Verlon Setting   . RIGHT/LEFT HEART CATH AND CORONARY ANGIOGRAPHY N/A 11/27/2016   Procedure: RIGHT/LEFT HEART CATH AND CORONARY ANGIOGRAPHY;  Surgeon: Martinique, Peter M, MD;  Location: [facility];  Service: Cardiovascular;  Laterality: N/A;  . SQUAMOUS CELL CARCINOMA EXCISION    . TEE WITHOUT CARDIOVERSION N/A 01/29/2017   Procedure: TRANSESOPHAGEAL ECHOCARDIOGRAM (TEE);  Surgeon: Sherren Mocha, MD;  Location: [place];  Service: Open Heart  Surgery;  Laterality: N/A;  . TONSILLECTOMY    . TRANSCATHETER AORTIC VALVE REPLACEMENT, TRANSFEMORAL N/A 01/29/2017   Procedure: TRANSCATHETER AORTIC VALVE REPLACEMENT, TRANSFEMORAL;  Surgeon: Sherren Mocha, MD;  Location: [place];  Service: Open Heart Surgery;  Laterality: N/A;    Current Medications: Current Meds  Medication Sig  . acetaminophen (TYLENOL) 500 MG tablet Take 1,000 mg by mouth every 4 (four) hours as needed for moderate pain or headache.   Marland Kitchen amiodarone (PACERONE) 200 MG tablet Take 0.5 tablets (100 mg total) by mouth daily.  Marland Kitchen amLODipine (NORVASC) 5 MG tablet Take 1 tablet (5 mg total) by mouth daily.  . calcium carbonate (OS-CAL) 600 MG TABS Take 600 mg by mouth 2 (two) times daily with a meal.   . clindamycin (CLEOCIN) 300 MG capsule Take 2 tablets by mouth one hour prior to any dental appointments and stomach or bladder procedure.  . dicyclomine (BENTYL) 10 MG capsule Take 10 mg by mouth 2 (two) times daily as needed for spasms.   Marland Kitchen ELIQUIS 2.5 MG TABS tablet TAKE 1 TABLET(2.5 MG) BY MOUTH TWICE DAILY  . ezetimibe (ZETIA) 10 MG tablet Take 1 tablet (10 mg total) by mouth daily.  . furosemide (LASIX) 20 MG tablet Take 20 mg every other day  . losartan (COZAAR) 25 MG tablet Take 1 tablet (25 mg total) by mouth daily.  . metoprolol tartrate (LOPRESSOR) 25 MG tablet Take 1.5 tablets (37.5 mg total) by mouth 2 (two) times daily.  . nitroGLYCERIN (NITROSTAT) 0.4 MG SL tablet Place 1 tablet (0.4 mg total) under the tongue every 5 (five) minutes as needed for chest pain.  . pantoprazole (PROTONIX) 40 MG tablet Take 1 tablet (40 mg total) by mouth daily.  . polyvinyl alcohol (LIQUIFILM TEARS) 1.4 % ophthalmic solution Place 1-2 drops into both eyes 3 (three) times daily as needed for dry eyes.  . sodium chloride (OCEAN) 0.65 % SOLN nasal spray Place 1 spray into both nostrils as needed for congestion.  Marland Kitchen SYNTHROID 125 MCG tablet   . [DISCONTINUED] levothyroxine (SYNTHROID,  LEVOTHROID) 112 MCG tablet Take 112 mcg by mouth daily before breakfast.      Allergies:   Penicillins, Tetanus toxoid, Demerol, and Codeine   Social History   Socioeconomic History  . Marital status: Widowed    Spouse name: Not on file  . Number of children: 0  . Years of education: Not on file  . Highest education level: Not on file  Occupational History    Employer: RETIRED  Social Needs  . Financial resource strain: Not on file  . Food insecurity    Worry: Not on file    Inability: Not on file  . Transportation needs    Medical: Not on file    Non-medical: Not on file  Tobacco Use  . Smoking status: Former Smoker  Packs/day: 1.50    Years: 30.00    Pack years: 45.00    Types: Cigarettes    Quit date: 03/05/1978    Years since quitting: 40.6  . Smokeless tobacco: Never Used  Substance and Sexual Activity  . Alcohol use: No  . Drug use: No  . Sexual activity: Not on file  Lifestyle  . Physical activity    Days per week: Not on file    Minutes per session: Not on file  . Stress: Not on file  Relationships  . Social Herbalist on phone: Not on file    Gets together: Not on file    Attends religious service: Not on file    Active member of club or organization: Not on file    Attends meetings of clubs or organizations: Not on file    Relationship status: Not on file  Other Topics Concern  . Not on file  Social History Narrative  . Not on file     Family History: The patient's family history includes Alzheimer's disease in her mother; Cancer in her father.  ROS:   Please see the history of present illness.     All other systems reviewed and are negative.  EKGs/Labs/Other Studies Reviewed:    The following studies were reviewed today:  Reviewed recent PPM transmissions  EKG:  EKG is  ordered today.  The ekg ordered today demonstrates atrial-paced rhythm HR 69  Recent Labs: 02/05/2018: ALT 9; Hemoglobin 11.6; Platelets 182; TSH 9.620  08/25/2018: BUN 29; Creatinine, Ser 1.32; Potassium 4.4; Sodium 139  Recent Lipid Panel    Component Value Date/Time   CHOL 219 (H) 02/05/2018 1440   TRIG 70 02/05/2018 1440   HDL 103 02/05/2018 1440   CHOLHDL 2.1 02/05/2018 1440   LDLCALC 102 (H) 02/05/2018 1440    Physical Exam:    VS:  BP 136/72   Pulse 84   Temp (!) 97.3 F (36.3 C)   Ht 5\' 6"  (1.676 m)   Wt 125 lb 12.8 oz (57.1 kg)   SpO2 96%   BMI 20.30 kg/m     Wt Readings from Last 3 Encounters:  10/03/18 125 lb 12.8 oz (57.1 kg)  08/08/18 122 lb (55.3 kg)  07/15/18 122 lb (55.3 kg)     GEN: elderly female, very pleasant,  in no acute distress HEENT: Normal NECK: No JVD; No carotid bruits LYMPHATICS: No lymphadenopathy CARDIAC: RRR, no murmurs, rubs, gallops RESPIRATORY:  Clear to auscultation without rales, wheezing or rhonchi  ABDOMEN: Soft, non-tender, non-distended MUSCULOSKELETAL:  No edema; No deformity  SKIN: Warm and dry NEUROLOGIC:  Alert and oriented x 3 PSYCHIATRIC:  Normal affect   ASSESSMENT:    1. Dizziness   2. Essential hypertension   3. Coronary artery disease involving native coronary artery of native heart without angina pectoris   4. Severe aortic stenosis   5. Paroxysmal atrial fibrillation (HCC)   6. Hyperlipidemia, unspecified hyperlipidemia type   7. Pacemaker-Medtronic   8. Long term current use of anticoagulant therapy    PLAN:    In order of problems listed above:  Dizziness I have reviewed her prior studies and do not see a clear etiology for her dizziness. She has seen neurology and ENT. I will check carotid dopplers and will interrogate her PPM today. She does take 20 mg lasix every other day, but has been stable on this regimen and I hesitate to make changes at this point.   Essential  hypertension Stable, although somewhat labile pressures. No medication changes.   Coronary artery disease involving native coronary artery of native heart without angina pectoris No  chest pain, stable.   Severe aortic stenosis S/P TAVR Stable on last echo  Paroxysmal atrial fibrillation Pacemaker-Medtronic Long term current use of anticoagulant therapy  No palpitations. Will interrogate PPM.  Hyperlipidemia, unspecified hyperlipidemia type  02/05/2018: Cholesterol, Total 219; HDL 103; LDL Calculated 102; Triglycerides 70   Follow up with Dr. Martinique in 1 month to discuss progress and dopplers.   Medication Adjustments/Labs and Tests Ordered: Current medicines are reviewed at length with the patient today.  Concerns regarding medicines are outlined above.  Orders Placed This Encounter  Procedures  . EKG 12-Lead   No orders of the defined types were placed in this encounter.   Signed, Ledora Bottcher, PA  10/03/2018 1:13 PM    [facility]

## 2018-10-02 NOTE — Telephone Encounter (Signed)
Spoke to patient she stated she had a episode about 1 week ago.Stated she has been having these spells in which she feels like she is fading away.Stated spells come in waves.She has not passed out, but she feels like she could.Stated the episode 1 week ago was the worst one she has had.Stated she is scared the spells happen with no warning.Appointment scheduled with Fabian Sharp PA tomorrow 7/31 at 12:00 noon.

## 2018-10-03 ENCOUNTER — Ambulatory Visit (INDEPENDENT_AMBULATORY_CARE_PROVIDER_SITE_OTHER): Payer: Medicare Other | Admitting: Physician Assistant

## 2018-10-03 ENCOUNTER — Other Ambulatory Visit: Payer: Self-pay

## 2018-10-03 ENCOUNTER — Encounter: Payer: Self-pay | Admitting: Physician Assistant

## 2018-10-03 VITALS — BP 136/72 | HR 84 | Temp 97.3°F | Ht 66.0 in | Wt 125.8 lb

## 2018-10-03 DIAGNOSIS — I10 Essential (primary) hypertension: Secondary | ICD-10-CM | POA: Diagnosis not present

## 2018-10-03 DIAGNOSIS — R42 Dizziness and giddiness: Secondary | ICD-10-CM

## 2018-10-03 DIAGNOSIS — I350 Nonrheumatic aortic (valve) stenosis: Secondary | ICD-10-CM | POA: Diagnosis not present

## 2018-10-03 DIAGNOSIS — I480 Paroxysmal atrial fibrillation: Secondary | ICD-10-CM | POA: Diagnosis not present

## 2018-10-03 DIAGNOSIS — E785 Hyperlipidemia, unspecified: Secondary | ICD-10-CM

## 2018-10-03 DIAGNOSIS — Z950 Presence of cardiac pacemaker: Secondary | ICD-10-CM

## 2018-10-03 DIAGNOSIS — I2510 Atherosclerotic heart disease of native coronary artery without angina pectoris: Secondary | ICD-10-CM

## 2018-10-03 DIAGNOSIS — Z7901 Long term (current) use of anticoagulants: Secondary | ICD-10-CM

## 2018-10-03 NOTE — Patient Instructions (Signed)
Medication Instructions:  Your physician recommends that you continue on your current medications as directed. Please refer to the Current Medication list given to you today.  If you need a refill on your cardiac medications before your next appointment, please call your pharmacy.    Testing/Procedures: Your physician has requested that you have a carotid duplex. This test is an ultrasound of the carotid arteries in your neck. It looks at blood flow through these arteries that supply the brain with blood. Allow one hour for this exam. There are no restrictions or special instructions.  Follow-Up: At [facility], you and your health needs are our priority.  As part of our continuing mission to provide you with exceptional heart care, we have created designated Provider Care Teams.  These Care Teams include your primary Cardiologist (physician) and Advanced Practice Providers (APPs -  Physician Assistants and Nurse Practitioners) who all work together to provide you with the care you need, when you need it. You will need a follow up appointment in 1 month.  Please call our office 2 months in advance to schedule this appointment.  You may see Peter Martinique, MD (ONLY) or one of the following Advanced Practice Providers on your designated Care Team: [place], [place] . Fabian Sharp, PA-C  Any Other Special Instructions Will Be Listed Below (If Applicable). None

## 2018-10-08 ENCOUNTER — Other Ambulatory Visit: Payer: Self-pay | Admitting: Physician Assistant

## 2018-10-08 DIAGNOSIS — R42 Dizziness and giddiness: Secondary | ICD-10-CM

## 2018-10-12 ENCOUNTER — Other Ambulatory Visit: Payer: Self-pay | Admitting: Cardiology

## 2018-10-13 ENCOUNTER — Other Ambulatory Visit: Payer: Self-pay | Admitting: *Deleted

## 2018-10-13 MED ORDER — EZETIMIBE 10 MG PO TABS: 10.0000 mg | ORAL_TABLET | Freq: Every day | ORAL | 3 refills | Status: DC

## 2018-10-13 MED ORDER — AMLODIPINE BESYLATE 5 MG PO TABS: 5.0000 mg | ORAL_TABLET | Freq: Every day | ORAL | 3 refills | Status: DC

## 2018-10-14 ENCOUNTER — Ambulatory Visit (INDEPENDENT_AMBULATORY_CARE_PROVIDER_SITE_OTHER): Payer: Medicare Other | Admitting: *Deleted

## 2018-10-14 DIAGNOSIS — I480 Paroxysmal atrial fibrillation: Secondary | ICD-10-CM

## 2018-10-14 DIAGNOSIS — I495 Sick sinus syndrome: Secondary | ICD-10-CM | POA: Diagnosis not present

## 2018-10-14 LAB — CUP PACEART REMOTE DEVICE CHECK
Battery Impedance: 137 Ohm
Battery Remaining Longevity: 126 mo
Battery Voltage: 2.78 V
Brady Statistic AP VP Percent: 0 %
Brady Statistic AP VS Percent: 96 %
Brady Statistic AS VP Percent: 0 %
Brady Statistic AS VS Percent: 3 %
Date Time Interrogation Session: 20200811125739
Implantable Lead Implant Date: 20180807
Implantable Lead Implant Date: 20180807
Implantable Lead Location: 753859
Implantable Lead Location: 753860
Implantable Lead Model: 5076
Implantable Lead Model: 5076
Implantable Pulse Generator Implant Date: 20180807
Lead Channel Impedance Value: 338 Ohm
Lead Channel Impedance Value: 532 Ohm
Lead Channel Pacing Threshold Amplitude: 0.75 V
Lead Channel Pacing Threshold Amplitude: 0.75 V
Lead Channel Pacing Threshold Pulse Width: 0.4 ms
Lead Channel Pacing Threshold Pulse Width: 0.4 ms
Lead Channel Setting Pacing Amplitude: 2 V
Lead Channel Setting Pacing Amplitude: 2.5 V
Lead Channel Setting Pacing Pulse Width: 0.4 ms
Lead Channel Setting Sensing Sensitivity: 5.6 mV

## 2018-10-17 ENCOUNTER — Ambulatory Visit (HOSPITAL_BASED_OUTPATIENT_CLINIC_OR_DEPARTMENT_OTHER)
Admission: RE | Admit: 2018-10-17 | Discharge: 2018-10-17 | Disposition: A | Discharge status: Home / Self Care | Payer: Medicare Other | Source: Ambulatory Visit | Attending: Physician Assistant | Admitting: Physician Assistant

## 2018-10-17 ENCOUNTER — Other Ambulatory Visit: Payer: Self-pay

## 2018-10-17 DIAGNOSIS — M25552 Pain in left hip: Secondary | ICD-10-CM | POA: Diagnosis not present

## 2018-10-17 DIAGNOSIS — R42 Dizziness and giddiness: Secondary | ICD-10-CM

## 2018-10-17 DIAGNOSIS — S72142A Displaced intertrochanteric fracture of left femur, initial encounter for closed fracture: Secondary | ICD-10-CM | POA: Diagnosis not present

## 2018-10-19 ENCOUNTER — Inpatient Hospital Stay (HOSPITAL_COMMUNITY)
Admission: EM | Admit: 2018-10-19 | Discharge: 2018-10-29 | DRG: 481 | Disposition: A | Discharge status: Skilled Nursing Facility | Payer: Medicare Other | Attending: Internal Medicine | Admitting: Internal Medicine

## 2018-10-19 ENCOUNTER — Encounter (HOSPITAL_COMMUNITY): Payer: Self-pay

## 2018-10-19 ENCOUNTER — Emergency Department (HOSPITAL_COMMUNITY): Payer: Medicare Other

## 2018-10-19 ENCOUNTER — Encounter (HOSPITAL_COMMUNITY)
Admission: EM | Disposition: A | Discharge status: Skilled Nursing Facility | Payer: Self-pay | Source: Home / Self Care | Attending: Internal Medicine

## 2018-10-19 ENCOUNTER — Other Ambulatory Visit: Payer: Self-pay

## 2018-10-19 ENCOUNTER — Inpatient Hospital Stay (HOSPITAL_COMMUNITY): Payer: Medicare Other | Admitting: Certified Registered Nurse Anesthetist

## 2018-10-19 ENCOUNTER — Inpatient Hospital Stay (HOSPITAL_COMMUNITY): Payer: Medicare Other

## 2018-10-19 DIAGNOSIS — S72142A Displaced intertrochanteric fracture of left femur, initial encounter for closed fracture: Principal | ICD-10-CM | POA: Diagnosis present

## 2018-10-19 DIAGNOSIS — S72002D Fracture of unspecified part of neck of left femur, subsequent encounter for closed fracture with routine healing: Secondary | ICD-10-CM | POA: Diagnosis not present

## 2018-10-19 DIAGNOSIS — R1013 Epigastric pain: Secondary | ICD-10-CM | POA: Diagnosis not present

## 2018-10-19 DIAGNOSIS — E785 Hyperlipidemia, unspecified: Secondary | ICD-10-CM | POA: Diagnosis present

## 2018-10-19 DIAGNOSIS — Z66 Do not resuscitate: Secondary | ICD-10-CM | POA: Diagnosis present

## 2018-10-19 DIAGNOSIS — Z20828 Contact with and (suspected) exposure to other viral communicable diseases: Secondary | ICD-10-CM | POA: Diagnosis present

## 2018-10-19 DIAGNOSIS — I10 Essential (primary) hypertension: Secondary | ICD-10-CM | POA: Diagnosis present

## 2018-10-19 DIAGNOSIS — Z79890 Hormone replacement therapy (postmenopausal): Secondary | ICD-10-CM | POA: Diagnosis not present

## 2018-10-19 DIAGNOSIS — Z85828 Personal history of other malignant neoplasm of skin: Secondary | ICD-10-CM | POA: Diagnosis not present

## 2018-10-19 DIAGNOSIS — N189 Chronic kidney disease, unspecified: Secondary | ICD-10-CM | POA: Diagnosis not present

## 2018-10-19 DIAGNOSIS — Z7901 Long term (current) use of anticoagulants: Secondary | ICD-10-CM | POA: Diagnosis not present

## 2018-10-19 DIAGNOSIS — I130 Hypertensive heart and chronic kidney disease with heart failure and stage 1 through stage 4 chronic kidney disease, or unspecified chronic kidney disease: Secondary | ICD-10-CM | POA: Diagnosis present

## 2018-10-19 DIAGNOSIS — D6959 Other secondary thrombocytopenia: Secondary | ICD-10-CM | POA: Diagnosis not present

## 2018-10-19 DIAGNOSIS — Z955 Presence of coronary angioplasty implant and graft: Secondary | ICD-10-CM | POA: Diagnosis not present

## 2018-10-19 DIAGNOSIS — Z87891 Personal history of nicotine dependence: Secondary | ICD-10-CM

## 2018-10-19 DIAGNOSIS — I480 Paroxysmal atrial fibrillation: Secondary | ICD-10-CM | POA: Diagnosis present

## 2018-10-19 DIAGNOSIS — W010XXA Fall on same level from slipping, tripping and stumbling without subsequent striking against object, initial encounter: Secondary | ICD-10-CM | POA: Diagnosis present

## 2018-10-19 DIAGNOSIS — I951 Orthostatic hypotension: Secondary | ICD-10-CM | POA: Diagnosis not present

## 2018-10-19 DIAGNOSIS — N183 Chronic kidney disease, stage 3 (moderate): Secondary | ICD-10-CM | POA: Diagnosis present

## 2018-10-19 DIAGNOSIS — K2900 Acute gastritis without bleeding: Secondary | ICD-10-CM | POA: Diagnosis not present

## 2018-10-19 DIAGNOSIS — K219 Gastro-esophageal reflux disease without esophagitis: Secondary | ICD-10-CM | POA: Diagnosis present

## 2018-10-19 DIAGNOSIS — N179 Acute kidney failure, unspecified: Secondary | ICD-10-CM | POA: Diagnosis present

## 2018-10-19 DIAGNOSIS — M25552 Pain in left hip: Secondary | ICD-10-CM | POA: Diagnosis present

## 2018-10-19 DIAGNOSIS — K2970 Gastritis, unspecified, without bleeding: Secondary | ICD-10-CM | POA: Diagnosis present

## 2018-10-19 DIAGNOSIS — Z952 Presence of prosthetic heart valve: Secondary | ICD-10-CM

## 2018-10-19 DIAGNOSIS — I4891 Unspecified atrial fibrillation: Secondary | ICD-10-CM | POA: Diagnosis present

## 2018-10-19 DIAGNOSIS — R1310 Dysphagia, unspecified: Secondary | ICD-10-CM | POA: Diagnosis present

## 2018-10-19 DIAGNOSIS — Y92488 Other paved roadways as the place of occurrence of the external cause: Secondary | ICD-10-CM | POA: Diagnosis not present

## 2018-10-19 DIAGNOSIS — Z419 Encounter for procedure for purposes other than remedying health state, unspecified: Secondary | ICD-10-CM

## 2018-10-19 DIAGNOSIS — D696 Thrombocytopenia, unspecified: Secondary | ICD-10-CM | POA: Diagnosis not present

## 2018-10-19 DIAGNOSIS — I5032 Chronic diastolic (congestive) heart failure: Secondary | ICD-10-CM | POA: Diagnosis present

## 2018-10-19 DIAGNOSIS — D62 Acute posthemorrhagic anemia: Secondary | ICD-10-CM | POA: Diagnosis not present

## 2018-10-19 DIAGNOSIS — M62838 Other muscle spasm: Secondary | ICD-10-CM | POA: Diagnosis present

## 2018-10-19 DIAGNOSIS — Z79899 Other long term (current) drug therapy: Secondary | ICD-10-CM | POA: Diagnosis not present

## 2018-10-19 DIAGNOSIS — S72002A Fracture of unspecified part of neck of left femur, initial encounter for closed fracture: Secondary | ICD-10-CM

## 2018-10-19 DIAGNOSIS — I2510 Atherosclerotic heart disease of native coronary artery without angina pectoris: Secondary | ICD-10-CM | POA: Diagnosis present

## 2018-10-19 DIAGNOSIS — Z950 Presence of cardiac pacemaker: Secondary | ICD-10-CM | POA: Diagnosis not present

## 2018-10-19 DIAGNOSIS — E039 Hypothyroidism, unspecified: Secondary | ICD-10-CM | POA: Diagnosis present

## 2018-10-19 DIAGNOSIS — W19XXXA Unspecified fall, initial encounter: Secondary | ICD-10-CM

## 2018-10-19 DIAGNOSIS — S72009A Fracture of unspecified part of neck of unspecified femur, initial encounter for closed fracture: Secondary | ICD-10-CM

## 2018-10-19 DIAGNOSIS — R0902 Hypoxemia: Secondary | ICD-10-CM | POA: Diagnosis present

## 2018-10-19 HISTORY — PX: INTRAMEDULLARY (IM) NAIL INTERTROCHANTERIC: SHX5875

## 2018-10-19 LAB — BASIC METABOLIC PANEL
Anion gap: 11 (ref 5–15)
BUN: 28 mg/dL — ABNORMAL HIGH (ref 8–23)
CO2: 23 mmol/L (ref 22–32)
Calcium: 9.3 mg/dL (ref 8.9–10.3)
Chloride: 102 mmol/L (ref 98–111)
Creatinine, Ser: 1.11 mg/dL — ABNORMAL HIGH (ref 0.44–1.00)
GFR calc Af Amer: 52 mL/min — ABNORMAL LOW (ref >60)
GFR calc non Af Amer: 45 mL/min — ABNORMAL LOW (ref >60)
Glucose, Bld: 111 mg/dL — ABNORMAL HIGH (ref 70–99)
Potassium: 4.4 mmol/L (ref 3.5–5.1)
Sodium: 136 mmol/L (ref 135–145)

## 2018-10-19 LAB — CBC WITH DIFFERENTIAL/PLATELET
Abs Immature Granulocytes: 0.04 10*3/uL (ref 0.00–0.07)
Basophils Absolute: 0.1 10*3/uL (ref 0.0–0.1)
Basophils Relative: 1 %
Eosinophils Absolute: 0.1 10*3/uL (ref 0.0–0.5)
Eosinophils Relative: 1 %
HCT: 38.1 % (ref 36.0–46.0)
Hemoglobin: 12 g/dL (ref 12.0–15.0)
Immature Granulocytes: 1 %
Lymphocytes Relative: 26 %
Lymphs Abs: 2.1 10*3/uL (ref 0.7–4.0)
MCH: 30.9 pg (ref 26.0–34.0)
MCHC: 31.5 g/dL (ref 30.0–36.0)
MCV: 98.2 fL (ref 80.0–100.0)
Monocytes Absolute: 0.6 10*3/uL (ref 0.1–1.0)
Monocytes Relative: 7 %
Neutro Abs: 5.1 10*3/uL (ref 1.7–7.7)
Neutrophils Relative %: 64 %
Platelets: 178 10*3/uL (ref 150–400)
RBC: 3.88 MIL/uL (ref 3.87–5.11)
RDW: 15.5 % (ref 11.5–15.5)
WBC: 8 10*3/uL (ref 4.0–10.5)
nRBC: 0 % (ref 0.0–0.2)

## 2018-10-19 LAB — SARS CORONAVIRUS 2 BY RT PCR (HOSPITAL ORDER, PERFORMED IN ~~LOC~~ HOSPITAL LAB): SARS Coronavirus 2: NEGATIVE

## 2018-10-19 SURGERY — FIXATION, FRACTURE, INTERTROCHANTERIC, WITH INTRAMEDULLARY ROD
Anesthesia: General | Laterality: Left

## 2018-10-19 MED ORDER — LOSARTAN POTASSIUM 50 MG PO TABS
25.0000 mg | ORAL_TABLET | Freq: Every day | ORAL | Status: DC
  Filled 2018-10-19: qty 1

## 2018-10-19 MED ORDER — METOCLOPRAMIDE HCL 5 MG PO TABS: 5.0000 mg | ORAL_TABLET | Freq: Three times a day (TID) | ORAL | Status: DC | PRN

## 2018-10-19 MED ORDER — ACETAMINOPHEN 325 MG PO TABS: 650.0000 mg | ORAL_TABLET | Freq: Four times a day (QID) | ORAL | Status: DC | PRN

## 2018-10-19 MED ORDER — CALCIUM CARBONATE 1250 (500 CA) MG PO TABS
625.0000 mg | ORAL_TABLET | Freq: Two times a day (BID) | ORAL | Status: DC
  Administered 2018-10-20 – 2018-10-29 (×19): 625 mg via ORAL
  Filled 2018-10-19 (×19): qty 1

## 2018-10-19 MED ORDER — FENTANYL CITRATE (PF) 100 MCG/2ML IJ SOLN
100.0000 ug | Freq: Once | INTRAMUSCULAR | Status: AC
  Administered 2018-10-19: 100 ug via INTRAVENOUS
  Filled 2018-10-19: qty 2

## 2018-10-19 MED ORDER — METOPROLOL TARTRATE 25 MG PO TABS
37.5000 mg | ORAL_TABLET | Freq: Two times a day (BID) | ORAL | Status: DC
  Administered 2018-10-19: 37.5 mg via ORAL
  Filled 2018-10-19 (×2): qty 2

## 2018-10-19 MED ORDER — SUCCINYLCHOLINE CHLORIDE 200 MG/10ML IV SOSY
PREFILLED_SYRINGE | INTRAVENOUS | Status: DC | PRN
  Administered 2018-10-19: 120 mg via INTRAVENOUS

## 2018-10-19 MED ORDER — DOCUSATE SODIUM 100 MG PO CAPS
100.0000 mg | ORAL_CAPSULE | Freq: Two times a day (BID) | ORAL | Status: DC
  Administered 2018-10-19 – 2018-10-28 (×18): 100 mg via ORAL
  Filled 2018-10-19 (×20): qty 1

## 2018-10-19 MED ORDER — VANCOMYCIN HCL 1000 MG IV SOLR
INTRAVENOUS | Status: DC | PRN
  Administered 2018-10-19: 1000 mg via INTRAVENOUS

## 2018-10-19 MED ORDER — POLYVINYL ALCOHOL 1.4 % OP SOLN
1.0000 [drp] | Freq: Three times a day (TID) | OPHTHALMIC | Status: DC | PRN
  Filled 2018-10-19: qty 15

## 2018-10-19 MED ORDER — EZETIMIBE 10 MG PO TABS
10.0000 mg | ORAL_TABLET | Freq: Every day | ORAL | Status: DC
  Administered 2018-10-20 – 2018-10-29 (×10): 10 mg via ORAL
  Filled 2018-10-19 (×10): qty 1

## 2018-10-19 MED ORDER — SODIUM CHLORIDE 0.9 % IV SOLN
INTRAVENOUS | Status: DC
  Administered 2018-10-19: 12:00:00 via INTRAVENOUS

## 2018-10-19 MED ORDER — HYDROMORPHONE HCL 1 MG/ML IJ SOLN
0.2500 mg | INTRAMUSCULAR | Status: DC | PRN
  Administered 2018-10-19 (×2): 0.5 mg via INTRAVENOUS

## 2018-10-19 MED ORDER — PROPOFOL 10 MG/ML IV BOLUS
INTRAVENOUS | Status: DC | PRN
  Administered 2018-10-19: 100 mg via INTRAVENOUS

## 2018-10-19 MED ORDER — SUGAMMADEX SODIUM 200 MG/2ML IV SOLN
INTRAVENOUS | Status: DC | PRN
  Administered 2018-10-19: 200 mg via INTRAVENOUS

## 2018-10-19 MED ORDER — PHENOL 1.4 % MT LIQD: 1.0000 | OROMUCOSAL | Status: DC | PRN

## 2018-10-19 MED ORDER — LEVOTHYROXINE SODIUM 25 MCG PO TABS
125.0000 ug | ORAL_TABLET | Freq: Every day | ORAL | Status: DC
  Administered 2018-10-20 – 2018-10-29 (×10): 125 ug via ORAL
  Filled 2018-10-19 (×10): qty 1

## 2018-10-19 MED ORDER — METHOCARBAMOL 1000 MG/10ML IJ SOLN
500.0000 mg | Freq: Four times a day (QID) | INTRAVENOUS | Status: DC | PRN
  Administered 2018-10-20 – 2018-10-22 (×2): 500 mg via INTRAVENOUS
  Filled 2018-10-19: qty 500
  Filled 2018-10-19 (×5): qty 5

## 2018-10-19 MED ORDER — ONDANSETRON HCL 4 MG/2ML IJ SOLN
INTRAMUSCULAR | Status: DC | PRN
  Administered 2018-10-19: 4 mg via INTRAVENOUS

## 2018-10-19 MED ORDER — FENTANYL CITRATE (PF) 250 MCG/5ML IJ SOLN
INTRAMUSCULAR | Status: AC
  Filled 2018-10-19: qty 5

## 2018-10-19 MED ORDER — ONDANSETRON HCL 4 MG PO TABS: 4.0000 mg | ORAL_TABLET | Freq: Four times a day (QID) | ORAL | Status: DC | PRN

## 2018-10-19 MED ORDER — MEPERIDINE HCL 25 MG/ML IJ SOLN: 6.2500 mg | INTRAMUSCULAR | Status: DC | PRN

## 2018-10-19 MED ORDER — METOCLOPRAMIDE HCL 5 MG/ML IJ SOLN
5.0000 mg | Freq: Three times a day (TID) | INTRAMUSCULAR | Status: DC | PRN
  Administered 2018-10-20: 09:00:00 10 mg via INTRAVENOUS
  Filled 2018-10-19: qty 2

## 2018-10-19 MED ORDER — VANCOMYCIN HCL IN DEXTROSE 1-5 GM/200ML-% IV SOLN
INTRAVENOUS | Status: AC
  Filled 2018-10-19: qty 200

## 2018-10-19 MED ORDER — MENTHOL 3 MG MT LOZG: 1.0000 | LOZENGE | OROMUCOSAL | Status: DC | PRN

## 2018-10-19 MED ORDER — HYDROCODONE-ACETAMINOPHEN 5-325 MG PO TABS
1.0000 | ORAL_TABLET | Freq: Four times a day (QID) | ORAL | Status: DC | PRN
  Administered 2018-10-20: 2 via ORAL
  Administered 2018-10-21 – 2018-10-22 (×3): 1 via ORAL
  Administered 2018-10-22 – 2018-10-23 (×2): 2 via ORAL
  Administered 2018-10-24 – 2018-10-28 (×7): 1 via ORAL
  Filled 2018-10-19 (×4): qty 1
  Filled 2018-10-19 (×2): qty 2
  Filled 2018-10-19 (×4): qty 1
  Filled 2018-10-19: qty 2
  Filled 2018-10-19 (×2): qty 1

## 2018-10-19 MED ORDER — ONDANSETRON HCL 4 MG/2ML IJ SOLN
4.0000 mg | Freq: Once | INTRAMUSCULAR | Status: AC
  Administered 2018-10-19: 13:00:00 4 mg via INTRAVENOUS
  Filled 2018-10-19: qty 2

## 2018-10-19 MED ORDER — CLINDAMYCIN PHOSPHATE 600 MG/50ML IV SOLN
600.0000 mg | Freq: Four times a day (QID) | INTRAVENOUS | Status: AC
  Administered 2018-10-19 – 2018-10-20 (×2): 600 mg via INTRAVENOUS
  Filled 2018-10-19 (×2): qty 50

## 2018-10-19 MED ORDER — ONDANSETRON HCL 4 MG/2ML IJ SOLN: 4.0000 mg | Freq: Four times a day (QID) | INTRAMUSCULAR | Status: DC | PRN

## 2018-10-19 MED ORDER — POLYETHYLENE GLYCOL 3350 17 G PO PACK: 17.0000 g | PACK | Freq: Every day | ORAL | Status: DC | PRN

## 2018-10-19 MED ORDER — ROCURONIUM BROMIDE 10 MG/ML (PF) SYRINGE
PREFILLED_SYRINGE | INTRAVENOUS | Status: DC | PRN
  Administered 2018-10-19: 50 mg via INTRAVENOUS

## 2018-10-19 MED ORDER — 0.9 % SODIUM CHLORIDE (POUR BTL) OPTIME
TOPICAL | Status: DC | PRN
  Administered 2018-10-19: 1000 mL

## 2018-10-19 MED ORDER — LACTATED RINGERS IV SOLN
INTRAVENOUS | Status: DC
  Administered 2018-10-19 (×2): via INTRAVENOUS

## 2018-10-19 MED ORDER — BISACODYL 5 MG PO TBEC
5.0000 mg | DELAYED_RELEASE_TABLET | Freq: Every day | ORAL | Status: DC | PRN
  Administered 2018-10-25: 5 mg via ORAL
  Filled 2018-10-19: qty 1

## 2018-10-19 MED ORDER — PANTOPRAZOLE SODIUM 40 MG PO TBEC
40.0000 mg | DELAYED_RELEASE_TABLET | Freq: Every day | ORAL | Status: DC
  Administered 2018-10-19 – 2018-10-23 (×5): 40 mg via ORAL
  Filled 2018-10-19 (×5): qty 1

## 2018-10-19 MED ORDER — DOCUSATE SODIUM 100 MG PO CAPS: 100.0000 mg | ORAL_CAPSULE | Freq: Two times a day (BID) | ORAL | Status: DC

## 2018-10-19 MED ORDER — DEXAMETHASONE SODIUM PHOSPHATE 10 MG/ML IJ SOLN
INTRAMUSCULAR | Status: DC | PRN
  Administered 2018-10-19: 10 mg via INTRAVENOUS

## 2018-10-19 MED ORDER — PROPOFOL 10 MG/ML IV BOLUS
INTRAVENOUS | Status: AC
  Filled 2018-10-19: qty 20

## 2018-10-19 MED ORDER — HYDROMORPHONE HCL 1 MG/ML IJ SOLN
INTRAMUSCULAR | Status: AC
  Filled 2018-10-19: qty 1

## 2018-10-19 MED ORDER — ONDANSETRON HCL 4 MG/2ML IJ SOLN: 4.0000 mg | Freq: Once | INTRAMUSCULAR | Status: DC | PRN

## 2018-10-19 MED ORDER — FENTANYL CITRATE (PF) 250 MCG/5ML IJ SOLN
INTRAMUSCULAR | Status: DC | PRN
  Administered 2018-10-19: 100 ug via INTRAVENOUS
  Administered 2018-10-19 (×2): 50 ug via INTRAVENOUS

## 2018-10-19 MED ORDER — LIDOCAINE 2% (20 MG/ML) 5 ML SYRINGE
INTRAMUSCULAR | Status: DC | PRN
  Administered 2018-10-19: 100 mg via INTRAVENOUS

## 2018-10-19 MED ORDER — PRESERVISION AREDS 2 PO CAPS: 1.0000 | ORAL_CAPSULE | Freq: Two times a day (BID) | ORAL | Status: DC

## 2018-10-19 MED ORDER — PHENYLEPHRINE HCL (PRESSORS) 10 MG/ML IV SOLN
INTRAVENOUS | Status: DC | PRN
  Administered 2018-10-19 (×2): 80 ug via INTRAVENOUS

## 2018-10-19 MED ORDER — AMIODARONE HCL 200 MG PO TABS
100.0000 mg | ORAL_TABLET | Freq: Every day | ORAL | Status: DC
  Administered 2018-10-20 – 2018-10-29 (×10): 100 mg via ORAL
  Filled 2018-10-19 (×10): qty 1

## 2018-10-19 MED ORDER — ONDANSETRON HCL 4 MG/2ML IJ SOLN
4.0000 mg | Freq: Four times a day (QID) | INTRAMUSCULAR | Status: DC | PRN
  Administered 2018-10-20 – 2018-10-24 (×2): 4 mg via INTRAVENOUS
  Filled 2018-10-19 (×3): qty 2

## 2018-10-19 MED ORDER — AMLODIPINE BESYLATE 5 MG PO TABS
5.0000 mg | ORAL_TABLET | Freq: Every day | ORAL | Status: DC
  Filled 2018-10-19: qty 1

## 2018-10-19 SURGICAL SUPPLY — 39 items
BIT DRILL 4.3MMS DISTAL GRDTED (BIT) IMPLANT
BNDG COHESIVE 6X5 TAN STRL LF (GAUZE/BANDAGES/DRESSINGS) ×1 IMPLANT
COVER PERINEAL POST (MISCELLANEOUS) ×2 IMPLANT
COVER SURGICAL LIGHT HANDLE (MISCELLANEOUS) ×3 IMPLANT
DRAPE C-ARMOR (DRAPES) ×2 IMPLANT
DRAPE HALF SHEET 40X57 (DRAPES) ×1 IMPLANT
DRAPE STERI IOBAN 125X83 (DRAPES) ×2 IMPLANT
DRILL 4.3MMS DISTAL GRADUATED (BIT) ×2
DRSG MEPILEX BORDER 4X4 (GAUZE/BANDAGES/DRESSINGS) ×3 IMPLANT
ELECT REM PT RETURN 9FT ADLT (ELECTROSURGICAL) ×2
ELECTRODE REM PT RTRN 9FT ADLT (ELECTROSURGICAL) ×1 IMPLANT
GLOVE BIO SURGEON STRL SZ8 (GLOVE) ×3 IMPLANT
GLOVE BIOGEL PI IND STRL 8 (GLOVE) ×1 IMPLANT
GLOVE BIOGEL PI INDICATOR 8 (GLOVE) ×1
GOWN STRL REUS W/ TWL LRG LVL3 (GOWN DISPOSABLE) ×2 IMPLANT
GOWN STRL REUS W/ TWL XL LVL3 (GOWN DISPOSABLE) ×1 IMPLANT
GOWN STRL REUS W/TWL LRG LVL3 (GOWN DISPOSABLE) ×2
GOWN STRL REUS W/TWL XL LVL3 (GOWN DISPOSABLE) ×2
GUIDEPIN 3.2X17.5 THRD DISP (PIN) ×2 IMPLANT
GUIDEWIRE BALL NOSE 80CM (WIRE) ×1 IMPLANT
HFN LH 130 DEG 11MM X 380MM (Orthopedic Implant) ×1 IMPLANT
HIP FRA NAIL LAG SCREW 10.5X90 (Orthopedic Implant) ×2 IMPLANT
KIT BASIN OR (CUSTOM PROCEDURE TRAY) ×2 IMPLANT
KIT TURNOVER KIT B (KITS) ×2 IMPLANT
NS IRRIG 1000ML POUR BTL (IV SOLUTION) ×2 IMPLANT
PACK GENERAL/GYN (CUSTOM PROCEDURE TRAY) ×2 IMPLANT
PAD ARMBOARD 7.5X6 YLW CONV (MISCELLANEOUS) ×4 IMPLANT
SCREW BONE CORTICAL 5.0X44 (Screw) ×1 IMPLANT
SCREW DRILL BIT ANIT ROTATION (BIT) ×1 IMPLANT
SCREW LAG HIP FRA NAIL 10.5X90 (Orthopedic Implant) IMPLANT
STAPLER VISISTAT 35W (STAPLE) ×2 IMPLANT
SUT ETHILON 3 0 PS 1 (SUTURE) ×1 IMPLANT
SUT VIC AB 0 CT1 27 (SUTURE) ×2
SUT VIC AB 0 CT1 27XBRD ANBCTR (SUTURE) IMPLANT
SUT VIC AB 2-0 CT1 27 (SUTURE) ×2
SUT VIC AB 2-0 CT1 TAPERPNT 27 (SUTURE) IMPLANT
TOWEL GREEN STERILE (TOWEL DISPOSABLE) ×4 IMPLANT
TOWEL GREEN STERILE FF (TOWEL DISPOSABLE) ×2 IMPLANT
WATER STERILE IRR 1000ML POUR (IV SOLUTION) ×2 IMPLANT

## 2018-10-19 NOTE — H&P (Signed)
History and Physical    [name] [name] EZM:[id number] DOB: [birth year] DOA: 10/19/2018  PCP: [name], MD Consultants:  [place] - cardiology; orthopedics - Deloris Ping Patient coming from:  Home - lives alone; NOK: 2 HCPOAs - sister, Kathlen Mody, [phone]; friend, Karn Cassis, [phone]  Chief Complaint: Fall  HPI: [name] [name] is a 85 y.o. female with medical history significant of pacemaker placement; s/p TAVR; afib on Eliquis; CAD s/p DES 2018; hypothyroidism; HTN; HLD; chronic diastolic CHF; and stage 3 CKD presenting with a fall.  She was over at the golf tournament and going to watch the golf with her brother.  She was chatting, standing on the street.  She went to step off the street and lost her balance and fell.  She hurt her left hip and knew it was broken as soon as she fell.  She felt well prior.   ED Course:  Hip fracture.  Ortho consult pending.  On Eliquis.  Mechanical fall today.    Review of Systems: As per HPI; otherwise review of systems reviewed and negative.   Ambulatory Status:  Ambulates without assistance  Past Medical History:  Diagnosis Date   CKD (chronic kidney disease)    Complication of anesthesia    Coronary artery disease    a. 12/20/2016: s/p DES to LAD    Diverticulosis    GERD (gastroesophageal reflux disease)    Hemorrhoids    Hyperlipidemia    Hypertension    Hypothyroidism    Osteoarthritis    Paroxysmal atrial fibrillation (HCC)    a. on [place] with Xarelto but switched to Eliquis after stenting.    Presence of permanent cardiac pacemaker    S/P TAVR (transcatheter aortic valve replacement) 01/29/2017   23 mm Edwards Sapien 3 transcatheter heart valve placed via percutaneous right transfemoral approach     Past Surgical History:  Procedure Laterality Date   ABDOMINAL HYSTERECTOMY  1980   BASAL CELL CARCINOMA EXCISION     CARDIAC CATHETERIZATION  ~ 11/2016   CATARACT EXTRACTION W/ INTRAOCULAR LENS  IMPLANT,  BILATERAL Bilateral    CORONARY ANGIOPLASTY WITH STENT PLACEMENT  12/20/2016   "1 stent"   CORONARY STENT INTERVENTION N/A 12/20/2016   Procedure: CORONARY STENT INTERVENTION;  Surgeon: [place], Peter M, MD;  Location: [facility];  Service: Cardiovascular;  Laterality: N/A;   FINGER SURGERY Left    "fell; developed skiers thumb; had to operate on it"   INSERT / REPLACE / [place]   for syptomatic bradycardia and syncope -- in [place] REVISION/REPAIR N/A 10/09/2016   New left subclavian MDT Adapta L PPM dual chamber system implanted by Dr [name] with previously placed R subclavian system abandoned   PACEMAKER GENERATOR CHANGE  2001   pulse generator replacement by Dr. Pincus Large GENERATOR CHANGE  04/01/2008   PPM Medtronic -- model # ADDRL1 serial # [id number] -- pulse generator replacement by Dr. Verlon Setting    RIGHT/LEFT [place] N/A 11/27/2016   Procedure: RIGHT/LEFT HEART CATH AND CORONARY ANGIOGRAPHY;  Surgeon: Martinique, Peter M, MD;  Location: [facility];  Service: Cardiovascular;  Laterality: N/A;   SQUAMOUS CELL CARCINOMA EXCISION     TEE WITHOUT CARDIOVERSION N/A 01/29/2017   Procedure: TRANSESOPHAGEAL ECHOCARDIOGRAM (TEE);  Surgeon: Sherren Mocha, MD;  Location: [facility];  Service: Open Heart Surgery;  Laterality: N/A;   TONSILLECTOMY     TRANSCATHETER AORTIC VALVE REPLACEMENT, TRANSFEMORAL N/A  01/29/2017   Procedure: TRANSCATHETER AORTIC VALVE REPLACEMENT, TRANSFEMORAL;  Surgeon: Sherren Mocha, MD;  Location: [facility];  Service: Open Heart Surgery;  Laterality: N/A;    Social History   Socioeconomic History   Marital status: Widowed    Spouse name: Not on file   Number of children: 0   Years of education: Not on file   Highest education level: Not on file  Occupational History    Employer: RETIRED  Social Needs   Financial resource strain: Not on file   Food insecurity    Worry: Not  on file    Inability: Not on file   Transportation needs    Medical: Not on file    Non-medical: Not on file  Tobacco Use   Smoking status: Former Smoker    Packs/day: 1.50    Years: 30.00    Pack years: 45.00    Types: Cigarettes    Quit date: 03/05/1978    Years since quitting: 40.6   Smokeless tobacco: Never Used  Substance and Sexual Activity   Alcohol use: No   Drug use: No   Sexual activity: Not on file  Lifestyle   Physical activity    Days per week: Not on file    Minutes per session: Not on file   Stress: Not on file  Relationships   Social connections    Talks on phone: Not on file    Gets together: Not on file    Attends religious service: Not on file    Active member of club or organization: Not on file    Attends meetings of clubs or organizations: Not on file    Relationship status: Not on file   Intimate partner violence    Fear of current or ex partner: Not on file    Emotionally abused: Not on file    Physically abused: Not on file    Forced sexual activity: Not on file  Other Topics Concern   Not on file  Social History Narrative   Not on file    Allergies  Allergen Reactions   Penicillins Anaphylaxis and Other (See Comments)    Has patient had a PCN reaction causing immediate rash, facial/tongue/throat swelling, SOB or lightheadedness with hypotension: Yes Has patient had a PCN reaction causing severe rash involving mucus membranes or skin necrosis: No Has patient had a PCN reaction that required hospitalization: No Has patient had a PCN reaction occurring within the last 10 years: No If all of the above answers are "NO", then may proceed with Cephalosporin use.    Tetanus Toxoid Anaphylaxis   Demerol Other (See Comments)    Severe nausea   Codeine Nausea Only and Other (See Comments)    Severe nausea    Family History  Problem Relation Age of Onset   Cancer Father        lung cancer   Alzheimer's disease Mother      Prior to Admission medications   Medication Sig Start Date End Date Taking? Authorizing Provider  acetaminophen (TYLENOL) 500 MG tablet Take 1,000 mg by mouth every 4 (four) hours as needed for moderate pain or headache.     [provider]  amiodarone (PACERONE) 200 MG tablet Take 0.5 tablets (100 mg total) by mouth daily. 02/05/18   Martinique, Peter M, MD  amLODipine (NORVASC) 5 MG tablet Take 1 tablet (5 mg total) by mouth daily. 10/13/18 10/08/19  [place], Peter M, MD  calcium carbonate (OS-CAL) 600 MG TABS Take  600 mg by mouth 2 (two) times daily with a meal.     [provider]  clindamycin (CLEOCIN) 300 MG capsule Take 2 tablets by mouth one hour prior to any dental appointments and stomach or bladder procedure. 02/06/17   Eileen Stanford, PA-C  dicyclomine (BENTYL) 10 MG capsule Take 10 mg by mouth 2 (two) times daily as needed for spasms.  06/26/16   [provider]  ELIQUIS 2.5 MG TABS tablet TAKE 1 TABLET(2.5 MG) BY MOUTH TWICE DAILY 08/29/18   [place], Peter M, MD  ezetimibe (ZETIA) 10 MG tablet Take 1 tablet (10 mg total) by mouth daily. 10/13/18 10/08/19  [place], Peter M, MD  furosemide (LASIX) 20 MG tablet TAKE 1 TABLET BY MOUTH EVERY DAY; EXCEPT ON MONDAY AND FRIDAY 10/13/18   [place], Peter M, MD  loratadine (CLARITIN) 10 MG tablet Take 10 mg by mouth daily as needed for allergies.    [provider]  losartan (COZAAR) 25 MG tablet Take 1 tablet (25 mg total) by mouth daily. 08/20/18 11/18/18  Martin[place], Peter M, MD  metoprolol tartrate (LOPRESSOR) 25 MG tablet Take 1.5 tablets (37.5 mg total) by mouth 2 (two) times daily. 07/15/18   Almyra Deforest, PA  nitroGLYCERIN (NITROSTAT) 0.4 MG SL tablet Place 1 tablet (0.4 mg total) under the tongue every 5 (five) minutes as needed for chest pain. 08/27/18   [place], Peter M, MD  pantoprazole (PROTONIX) 40 MG tablet Take 1 tablet (40 mg total) by mouth daily. 02/05/18   Martin[place], Peter M, MD  polyvinyl alcohol (LIQUIFILM TEARS)  1.4 % ophthalmic solution Place 1-2 drops into both eyes 3 (three) times daily as needed for dry eyes.    [provider]  sodium chloride (OCEAN) 0.65 % SOLN nasal spray Place 1 spray into both nostrils as needed for congestion.    [provider]  SYNTHROID 125 MCG tablet  08/04/18   [provider]    Physical Exam: Vitals:   10/19/18 1500 10/19/18 1530 10/19/18 1558 10/19/18 1600  BP: (!) 142/64 (!) 134/59  (!) 134/59  Pulse: 63 69 (!) 59 60  Resp: 14 18 20 20   Temp:      TempSrc:      SpO2: 94% 92% 92% 96%  Weight:      Height:          General:  Appears calm and comfortable and is NAD; she is having paroxysms of pain causing her to cry out  Eyes:  PERRL, EOMI, normal lids, iris  ENT:  grossly normal hearing, lips & tongue, mmm; appropriate dentition  Neck:  no LAD, masses or thyromegaly  Cardiovascular:  RRR, no m/r/g. No LE edema.   Respiratory:   CTA bilaterally with no wheezes/rales/rhonchi.  Normal respiratory effort.  Abdomen:  soft, NT, ND, NABS  Skin:  no rash or induration seen on limited exam  Musculoskeletal:  L hip deformity with significant TTP; L leg shortening and foot with external rotation  Lower extremity:  No LE edema.  Limited foot exam with no ulcerations.  2+ distal pulses.  Psychiatric:  grossly normal mood and affect, speech fluent and appropriate, AOx3  Neurologic:  CN 2-12 grossly intact, moves all extremities in coordinated fashion, sensation intact    Radiological Exams on Admission: Dg Chest 1 View  Result Date: 10/19/2018 CLINICAL DATA:  Fall EXAM: CHEST  1 VIEW COMPARISON:  01/29/2017 FINDINGS: Chronic cardiomegaly. Pacer leads from both sides with sacrifice on the right. Transcatheter aortic  valve replacement. Chronic interstitial coarsening mild chronic lung disease/scarring on 2018 chest CT. Bronchial calcification likely contributes to the appearance. There is no edema, consolidation, effusion, or  pneumothorax. No appreciable fracture. IMPRESSION: No acute finding. Electronically Signed   By: Monte Fantasia M.D.   On: 10/19/2018 14:04   Dg Hip Unilat W Or Wo Pelvis 2-3 Views Left  Result Date: 10/19/2018 CLINICAL DATA:  Fall with left hip pain EXAM: DG HIP (WITH OR WITHOUT PELVIS) 2-3V LEFT COMPARISON:  None. FINDINGS: Acute intertrochanteric left femur fracture with proximal retraction of the lesser trochanter. On the lateral view there is dorsal impaction. No hip dislocation. No evidence of pelvic ring fracture. IMPRESSION: Intertrochanteric left femur fracture. Electronically Signed   By: Monte Fantasia M.D.   On: 10/19/2018 14:02    EKG: Independently reviewed.  Atrial paced with rate 83; no evidence of acute ischemia   Labs on Admission: I have personally reviewed the available labs and imaging studies at the time of the admission.  Pertinent labs:   Glucose 111 BUN 28/Creatinine 1.11/GFR 45 - stable/improved Normal CBC COVID negative  Assessment/Plan Principal Problem:   Hip fracture (HCC) Active Problems:   HLD (hyperlipidemia)   Essential hypertension   ATRIAL FIBRILLATION   CKD (chronic kidney disease)   S/P TAVR (transcatheter aortic valve replacement)   Hypothyroidism   Left closed hip fracture -Mechanical fall resulting in hip fracture -Orthopedics consulted; Dr. Marcelino Scot will plan to operate today -NPO in anticipation of surgical repair -SCDs overnight, resume Eliquis post-operatively (or as per ortho) -Pain control with Robxain, Vicodin, and Fentanyl prn -SW consult for rehab placement -Will need PT consult post-operatively; would consider CIR if this is a rehab option for her -Hip fracture order set utilized  Afib, with pacemaker, s/p TAVR, on Eliquis -Rate controlled -Has pacemaker -Continue Aniodarone -Hold Eliquis  HTN -Continue Norvasc, Cozaar, Lopressor -Hold Cozaar if uptrending creatinine, as noted above  HLD -Continue Zetia  Stage 3  CKD -Appears to be stable at this time -Recheck BMP in AM -Consider holding ACE if uptrending creatinine, but will continue for now given apparent stability  Hypothyroidism -Continue Synthroid  Note: This patient has been tested and is negative for the novel coronavirus COVID-19.    DVT prophylaxis:  SCDs until approved for Lovenox by orthopedics Code Status:  DNR - confirmed with patient Family Communication: Her brother was present throughout evaluation Disposition Plan:  Home once clinically improved Consults called: Orthopedics; SW, Nutrition; will need PT post-operatively  Admission status: Admit - It is my clinical opinion that admission to INPATIENT is reasonable and necessary because of the expectation that this patient will require hospital care that crosses at least 2 midnights to treat this condition based on the medical complexity of the problems presented.  Given the aforementioned information, the predictability of an adverse outcome is felt to be significant.    Karmen Bongo MD [facility]   How to contact the [facility] Attending or Consulting provider [place] or covering provider during after hours [place], for this patient?  1. Check the care team in [facility] and look for a) attending/consulting TR[facility] provider listed and b) the [facility] team listed 2. Log into [email or web address] and use [place]'s universal password to access. If you do not have the password, please contact the hospital operator. 3. Locate the [facility] provider you are looking for under [facility] and page to a number that you can be directly reached. 4. If you still have difficulty  reaching the provider, please page the [facility] (Director on Call) for the Hospitalists listed on amion for assistance.   10/19/2018, 4:10 PM

## 2018-10-19 NOTE — Consult Note (Addendum)
Orthopaedic Trauma Service (OTS) Consult   Patient ID: [name] [name] MRN: [id number] DOB/AGE: December 13, 1929 85 y.o.   Reason for Consult:Left intertroch 3 part hip fracture Referring Physician: Karmen Bongo, MD  HPI: [name] [name] is an 85 y.o. female is a pleasant female who fell while attending golf tournament today. Does not use assistive walking devices, no prior hip pain. On Eliquis. Significant cardiac history in addition to kidney, gerd, and hypothyroidism.  Past Medical History:  Diagnosis Date  . CKD (chronic kidney disease)   . Complication of anesthesia   . Coronary artery disease    a. 12/20/2016: s/p DES to LAD   . Diverticulosis   . GERD (gastroesophageal reflux disease)   . Hemorrhoids   . Hyperlipidemia   . Hypertension   . Hypothyroidism   . Osteoarthritis   . Paroxysmal atrial fibrillation (HCC)    a. on [place] with Xarelto but switched to Eliquis after stenting.   Marland Kitchen PONV (postoperative nausea and vomiting)   . Presence of permanent cardiac pacemaker   . S/P TAVR (transcatheter aortic valve replacement) 01/29/2017   23 mm Edwards Sapien 3 transcatheter heart valve placed via percutaneous right transfemoral approach     Past Surgical History:  Procedure Laterality Date  . ABDOMINAL HYSTERECTOMY  1980  . BASAL CELL CARCINOMA EXCISION    . CARDIAC CATHETERIZATION  ~ 11/2016  . CATARACT EXTRACTION W/ INTRAOCULAR LENS  IMPLANT, BILATERAL Bilateral   . CORONARY ANGIOPLASTY WITH STENT PLACEMENT  12/20/2016   "1 stent"  . CORONARY STENT INTERVENTION N/A 12/20/2016   Procedure: CORONARY STENT INTERVENTION;  Surgeon: Martinique, Peter M, MD;  Location: [facility];  Service: Cardiovascular;  Laterality: N/A;  . FINGER SURGERY Left    "fell; developed skiers thumb; had to operate on it"  . INSERT / REPLACE / [place]   for syptomatic bradycardia and syncope -- in [facility]  . LEAD REVISION/REPAIR N/A 10/09/2016   New left subclavian  MDT Adapta L PPM dual chamber system implanted by Dr [name] with previously placed R subclavian system abandoned  . PACEMAKER GENERATOR CHANGE  2001   pulse generator replacement by Dr. Rollene Fare   . PACEMAKER GENERATOR CHANGE  04/01/2008   PPM Medtronic -- model # [id number] serial # [id number] -- pulse generator replacement by Dr. Verlon Setting   . RIGHT/LEFT HEART CATH AND CORONARY ANGIOGRAPHY N/A 11/27/2016   Procedure: RIGHT/LEFT HEART CATH AND CORONARY ANGIOGRAPHY;  Surgeon: Martinique, Peter M, MD;  Location: [facility];  Service: Cardiovascular;  Laterality: N/A;  . SQUAMOUS CELL CARCINOMA EXCISION    . TEE WITHOUT CARDIOVERSION N/A 01/29/2017   Procedure: TRANSESOPHAGEAL ECHOCARDIOGRAM (TEE);  Surgeon: Sherren Mocha, MD;  Location: [facility];  Service: Open Heart Surgery;  Laterality: N/A;  . TONSILLECTOMY    . TRANSCATHETER AORTIC VALVE REPLACEMENT, TRANSFEMORAL N/A 01/29/2017   Procedure: TRANSCATHETER AORTIC VALVE REPLACEMENT, TRANSFEMORAL;  Surgeon: Sherren Mocha, MD;  Location: [facility];  Service: Open Heart Surgery;  Laterality: N/A;    Family History  Problem Relation Age of Onset  . Cancer Father        lung cancer  . Alzheimer's disease Mother     Social History:  reports that she quit smoking about 40 years ago. Her smoking use included cigarettes. She has a 45.00 pack-year smoking history. She has never used smokeless tobacco. She reports that she does not drink alcohol or use drugs.  Allergies:  Allergies  Allergen Reactions  . Penicillins Anaphylaxis and Other (See Comments)    Has patient had a PCN reaction causing immediate rash, facial/tongue/throat swelling, SOB or lightheadedness with hypotension: Yes Has patient had a PCN reaction causing severe rash involving mucus membranes or skin necrosis: No Has patient had a PCN reaction that required hospitalization: No Has patient had a PCN reaction occurring within the last 10 years: No If all of the above answers are "NO",  then may proceed with Cephalosporin use.   . Tetanus Toxoid Anaphylaxis  . Demerol Other (See Comments)    Severe nausea  . Codeine Nausea Only and Other (See Comments)    Severe nausea    Medications:  Prior to Admission:  Medications Prior to Admission  Medication Sig Dispense Refill Last Dose  . acetaminophen (TYLENOL) 500 MG tablet Take 1,000 mg by mouth every 4 (four) hours as needed for moderate pain or headache.    unk  . amiodarone (PACERONE) 200 MG tablet Take 0.5 tablets (100 mg total) by mouth daily. 45 tablet 3 10/19/2018 at Unknown time  . amLODipine (NORVASC) 5 MG tablet Take 1 tablet (5 mg total) by mouth daily. 90 tablet 3 10/19/2018 at Unknown time  . calcium carbonate (OS-CAL) 600 MG TABS Take 600 mg by mouth 2 (two) times daily with a meal.    10/19/2018 at Unknown time  . clindamycin (CLEOCIN) 300 MG capsule Take 2 tablets by mouth one hour prior to any dental appointments and stomach or bladder procedure. 2 capsule 4 unk  . dicyclomine (BENTYL) 10 MG capsule Take 10 mg by mouth 2 (two) times daily as needed for spasms.   1 unk  . ELIQUIS 2.5 MG TABS tablet TAKE 1 TABLET(2.5 MG) BY MOUTH TWICE DAILY (Patient taking differently: Take 2.5 mg by mouth 2 (two) times daily. ) 180 tablet 2 10/19/2018 at 0900  . ezetimibe (ZETIA) 10 MG tablet Take 1 tablet (10 mg total) by mouth daily. 90 tablet 3 10/19/2018 at Unknown time  . fluticasone (FLONASE) 50 MCG/ACT nasal spray Place 2 sprays into both nostrils as needed. Sinus clog   unk  . furosemide (LASIX) 20 MG tablet TAKE 1 TABLET BY MOUTH EVERY DAY; EXCEPT ON MONDAY AND FRIDAY (Patient taking differently: Take 20 mg by mouth every other day. ) 90 tablet 0 10/17/2018  . loratadine (CLARITIN) 10 MG tablet Take 10 mg by mouth daily as needed for allergies.   Past Week at Unknown time  . losartan (COZAAR) 25 MG tablet Take 1 tablet (25 mg total) by mouth daily. 90 tablet 3 10/19/2018 at Unknown time  . metoprolol tartrate (LOPRESSOR) 25 MG  tablet Take 1.5 tablets (37.5 mg total) by mouth 2 (two) times daily. 270 tablet 1 10/19/2018 at 0900  . Multiple Vitamins-Minerals (PRESERVISION AREDS 2) CAPS Take 1 capsule by mouth 2 (two) times daily.   10/19/2018 at Unknown time  . nitroGLYCERIN (NITROSTAT) 0.4 MG SL tablet Place 1 tablet (0.4 mg total) under the tongue every 5 (five) minutes as needed for chest pain. 25 tablet 12 unk  . pantoprazole (PROTONIX) 40 MG tablet Take 1 tablet (40 mg total) by mouth daily. (Patient taking differently: Take 40 mg by mouth daily. Take in the evening) 90 tablet 3 10/18/2018 at Unknown time  . polyvinyl alcohol (LIQUIFILM TEARS) 1.4 % ophthalmic solution Place 1-2 drops into both eyes 3 (three) times daily as needed for dry eyes.   Past Week at Unknown time  . sodium chloride (OCEAN)  0.65 % SOLN nasal spray Place 1 spray into both nostrils as needed for congestion.   Past Week at Unknown time  . SYNTHROID 125 MCG tablet Take 125 mcg by mouth daily before breakfast.    10/19/2018 at Unknown time    Results for orders placed or performed during the hospital encounter of 10/19/18 (from the past 48 hour(s))  CBC with Differential/Platelet     Status: None   Collection Time: 10/19/18 12:08 PM  Result Value Ref Range   WBC 8.0 4.0 - 10.5 K/uL   RBC 3.88 3.87 - 5.11 MIL/uL   Hemoglobin 12.0 12.0 - 15.0 g/dL   HCT 38.1 36.0 - 46.0 %   MCV 98.2 80.0 - 100.0 fL   MCH 30.9 26.0 - 34.0 pg   MCHC 31.5 30.0 - 36.0 g/dL   RDW 15.5 11.5 - 15.5 %   Platelets 178 150 - 400 K/uL   nRBC 0.0 0.0 - 0.2 %   Neutrophils Relative % 64 %   Neutro Abs 5.1 1.7 - 7.7 K/uL   Lymphocytes Relative 26 %   Lymphs Abs 2.1 0.7 - 4.0 K/uL   Monocytes Relative 7 %   Monocytes Absolute 0.6 0.1 - 1.0 K/uL   Eosinophils Relative 1 %   Eosinophils Absolute 0.1 0.0 - 0.5 K/uL   Basophils Relative 1 %   Basophils Absolute 0.1 0.0 - 0.1 K/uL   Immature Granulocytes 1 %   Abs Immature Granulocytes 0.04 0.00 - 0.07 K/uL    Comment:  Performed at [facility], [address], [place], [place] [address]  Basic metabolic panel     Status: Abnormal   Collection Time: 10/19/18 12:08 PM  Result Value Ref Range   Sodium 136 135 - 145 mmol/L   Potassium 4.4 3.5 - 5.1 mmol/L   Chloride 102 98 - 111 mmol/L   CO2 23 22 - 32 mmol/L   Glucose, Bld 111 (H) 70 - 99 mg/dL   BUN 28 (H) 8 - 23 mg/dL   Creatinine, Ser 1.11 (H) 0.44 - 1.00 mg/dL   Calcium 9.3 8.9 - 10.3 mg/dL   GFR calc non Af Amer 45 (L) >60 mL/min   GFR calc Af Amer 52 (L) >60 mL/min   Anion gap 11 5 - 15    Comment: Performed at [facility] [address] [place], [place] [address]  Type and screen     Status: None   Collection Time: 10/19/18 12:08 PM  Result Value Ref Range   ABO/RH(D) A POS    Antibody Screen NEG    Sample Expiration      10/22/2018,2359 Performed at [facility], [place] [address] [place], [place] [address]   SARS Coronavirus 2 [facility] order, Performed in [facility] hospital lab) Nasopharyngeal Nasopharyngeal Swab     Status: None   Collection Time: 10/19/18 12:09 PM   Specimen: Nasopharyngeal Swab  Result Value Ref Range   SARS Coronavirus 2 NEGATIVE NEGATIVE    Comment: (NOTE) If result is NEGATIVE SARS-CoV-2 target nucleic acids are NOT DETECTED. The SARS-CoV-2 RNA is generally detectable in upper and lower  respiratory specimens during the acute phase of infection. The lowest  concentration of SARS-CoV-2 viral copies this assay can detect is 250  copies / mL. A negative result does not preclude SARS-CoV-2 infection  and should not be used as the sole basis for treatment or other  patient management decisions.  A negative result may occur  with  improper specimen collection / handling, submission of specimen other  than nasopharyngeal swab, presence of viral mutation(s) within the  areas targeted by this assay, and inadequate number of viral copies  (<250 copies / mL). A negative result must be combined with  clinical  observations, patient history, and epidemiological information. If result is POSITIVE SARS-CoV-2 target nucleic acids are DETECTED. The SARS-CoV-2 RNA is generally detectable in upper and lower  respiratory specimens dur ing the acute phase of infection.  Positive  results are indicative of active infection with SARS-CoV-2.  Clinical  correlation with patient history and other diagnostic information is  necessary to determine patient infection status.  Positive results do  not rule out bacterial infection or co-infection with other viruses. If result is PRESUMPTIVE POSTIVE SARS-CoV-2 nucleic acids MAY BE PRESENT.   A presumptive positive result was obtained on the submitted specimen  and confirmed on repeat testing.  While 2019 novel coronavirus  (SARS-CoV-2) nucleic acids may be present in the submitted sample  additional confirmatory testing may be necessary for epidemiological  and / or clinical management purposes  to differentiate between  SARS-CoV-2 and other Sarbecovirus currently known to infect humans.  If clinically indicated additional testing with an alternate test  methodology [id number]) is advised. The SARS-CoV-2 RNA is generally  detectable in upper and lower respiratory sp ecimens during the acute  phase of infection. The expected result is Negative. Fact Sheet for Patients:  StrictlyIdeas.no Fact Sheet for Healthcare Providers: BankingDealers.co.za This test is not yet approved or cleared by the [place] FDA and has been authorized for detection and/or diagnosis of SARS-CoV-2 by FDA under an Emergency Use Authorization (EUA).  This EUA will remain in effect (meaning this test can be used) for the duration of the COVID-19 declaration under Section 564(b)(1) of the Act, 21 U.S.C. section 360bbb-3(b)(1), unless the authorization is terminated or revoked sooner. Performed at [facility], [place]  [address] [place], [place] [address]     Dg Chest 1 View  Result Date: 10/19/2018 CLINICAL DATA:  Fall EXAM: CHEST  1 VIEW COMPARISON:  01/29/2017 FINDINGS: Chronic cardiomegaly. Pacer leads from both sides with sacrifice on the right. Transcatheter aortic valve replacement. Chronic interstitial coarsening mild chronic lung disease/scarring on 2018 chest CT. Bronchial calcification likely contributes to the appearance. There is no edema, consolidation, effusion, or pneumothorax. No appreciable fracture. IMPRESSION: No acute finding. Electronically Signed   By: Monte Fantasia M.D.   On: 10/19/2018 14:04   Dg Hip Unilat W Or Wo Pelvis 2-3 Views Left  Result Date: 10/19/2018 CLINICAL DATA:  Fall with left hip pain EXAM: DG HIP (WITH OR WITHOUT PELVIS) 2-3V LEFT COMPARISON:  None. FINDINGS: Acute intertrochanteric left femur fracture with proximal retraction of the lesser trochanter. On the lateral view there is dorsal impaction. No hip dislocation. No evidence of pelvic ring fracture. IMPRESSION: Intertrochanteric left femur fracture. Electronically Signed   By: Monte Fantasia M.D.   On: 10/19/2018 14:02    ROS No recent fever, bleeding abnormalities, urologic dysfunction, GI problems, or weight gain. Blood pressure (!) 134/59, pulse 60, temperature 97.7 F (36.5 C), resp. rate 20, height 5\' 5"  (1.651 m), weight 55.3 kg, SpO2 96 %. Physical Exam Clearly in some mild distress NCAT LLE Tender left hip with shortening, ER  Edema/ swelling controlled  Sens: DPN, SPN, TN intact  Motor: EHL, FHL, and lessor toe ext and flex all intact grossly  Brisk cap refill, warm to touch, DP  1+, PT 1+        Gait: could not assess Coordination and balance: could not assess   Assessment/Plan: LEFT 3 PART INTERTROCHANTERIC HIP FRACTURE FOR IM NAILING  Dr. Lorin Mercy and I have reviewed the patient's injuries, comorbidities, laboratory studies, x-rays, and surgical risks together. We are in agreement that early hip  fracture repair is recommended.   I discussed with the patient the risks and benefits of surgery, including the possibility of infection, nerve injury, vessel injury, wound breakdown, arthritis, symptomatic hardware, DVT/ PE, loss of motion, malunion, nonunion, and need for further surgery among others.  We also specifically discussed her current use of Eliquis and bleeding complications as well as clotting complications that could be associated with immediate or delayed surgery.  He acknowledged these risks and wished to proceed.  Weightbearing: WBAT LLE Insicional and dressing care: Reinforce dressings as needed Orthopedic device(s): None Showering: Yes after POD 2 VTE prophylaxis: retrun to Eliquis on POD 2  Pain control: Norco Follow - up plan: 2 weeks Contact information:  Altamese Norwich MD, Ainsley Spinner PA   Altamese [place], MD [facility], [facility] [phone]  10/19/2018, 5:01 PM  [facility] [place] [address] [phone] [name] [name] (F)

## 2018-10-19 NOTE — ED Notes (Signed)
Pt brother gave me a list of numbers to add to chart if we need anything or updates. [name] [name] [phone] Home [phone] [name] [name] [phone]

## 2018-10-19 NOTE — ED Triage Notes (Signed)
Patient was at [facility], standing on the sidewalk talking to someone, stepped off of the sidewalk and lost balance, falling on left hip. No loss of consciousness per pt, reports feeling on the edge of nausea. Pt on [place], has demand pacemaker, significant cardiac hx. 20g in L AC, EMS gave 200 of fent, 4 of zofran in route. Sinus rhythm, last vitals 152/70, 98% on RA, HR 86, RR 16 Allergic to Penicillin

## 2018-10-19 NOTE — ED Notes (Signed)
Patient transported to X-ray 

## 2018-10-19 NOTE — Anesthesia Procedure Notes (Signed)
Procedure Name: Intubation Date/Time: 10/19/2018 5:42 PM Performed by: Clearnce Sorrel, CRNA Pre-anesthesia Checklist: Patient identified, Emergency Drugs available, Suction available, Patient being monitored and Timeout performed Patient Re-evaluated:Patient Re-evaluated prior to induction Oxygen Delivery Method: Circle system utilized Preoxygenation: Pre-oxygenation with 100% oxygen Induction Type: IV induction, Rapid sequence and Cricoid Pressure applied Laryngoscope Size: Mac and 3 Grade View: Grade I Tube type: Oral Tube size: 7.0 mm Number of attempts: 1 Airway Equipment and Method: Stylet Placement Confirmation: ETT inserted through vocal cords under direct vision,  positive ETCO2 and breath sounds checked- equal and bilateral Secured at: 21 cm Tube secured with: Tape Dental Injury: Teeth and Oropharynx as per pre-operative assessment

## 2018-10-19 NOTE — Progress Notes (Signed)
Pt's brother Baltazar Najjar called & updated per pt request w/ pt location post op, visiting hours, main number for 5N. Contact # [phone]

## 2018-10-19 NOTE — Op Note (Signed)
10/19/2018  7:44 PM  PATIENT:  [name] [name]  85 y.o. female  PRE-OPERATIVE DIAGNOSIS:  Left 3 Part Intertrochanteric Hip Fracture  POST-OPERATIVE DIAGNOSIS:  Left 3 Part Intertrochanteric Hip Fracture  PROCEDURE:  Intramedullary nailing of the right hip using a Biomet Affixus nail with 11 mm x 380 mm locked  SURGEON:  Astrid Divine. Marcelino Scot, M.D.  ASSISTANT:  Ainsley Spinner, PA-C.  ANESTHESIA:  General.  COMPLICATIONS:  None.  ESTIMATED BLOOD LOSS:  50 mL.  DISPOSITION:  To PACU.  CONDITION:  Stable.  BRIEF SUMMARY AND INDICATION OF PROCEDURE:  [name] [name] is a 85 y.o. year- old with multiple medical problems.  I discussed with the patient the risks and benefits of surgical treatment including the potential for malunion, nonunion, symptomatic hardware, heart attack,stroke, neurovascular injury, bleeding, and others.  After full discussion, the patient wished to proceed.  BRIEF SUMMARY OF PROCEDURE:  The patient was taken to the operating room where general anesthesia was induced.  She was positioned supine on the [place] fracture table.  A closed reduction maneuver was performed of the fractured proximal femur and this was confirmed on both AP and lateral xray views. A thorough scrub and wash with chlorhexidine and then Betadine scrub and paint was performed.  After sterile drapes and time-out, a long instrument was used to identify the appropriate starting position under C-arm on both AP and lateral images.  A 3 cm incision was made proximal to the greater trochanter.  The curved cannulated awl was inserted just medial to the tip of the lateral trochanter and then the starting guidewire advanced into the proximal femur.  This was checked on AP and lateral views.  The starting reamer was engaged with the soft tissue protected by a sleeve.  The curved ball-tipped guidewire was then inserted, making sure it was just posterior as possible in the distal femur and across the fracture  site, which stayed in a reduced position.  It was sequentially reamed up to 13 mm and an 13 x 380 mm nail inserted to the appropriate depth.  The guidewire for the lag screw was then inserted with the appropriate anteversion to make sure it was in a center-center position. As we were on a left hip, I also inserted a threaded pin to prevent rotation of the fracture fragments. This was measured and the lag screw of 90 mm placed with excellent purchase and position checked on both views.  The imbedded screw was engaged within the groove of the lag screw, which was allowed to telescope.  Traction was released and compression achieved with the screw.  This was followed by placement of one distal locking screw using perfect circle technique.  This was confirmed on AP and lateral images. Wounds were irrigated thoroughly, closed in a standard layered fashion. Sterile gently compressive dressings were applied. The patient was awakened from anesthesia and transported to the PACU in stable condition.  PROGNOSIS:  The patient will be weightbearing as tolerated with physical therapy and resumption of Eliquis on POD 1.  She has no range of motion precautions.  We will continue to follow through at the hospital.  Anticipate follow up in the office in 2 weeks for removal of sutures and further evaluation.     Astrid Divine. Marcelino Scot, M.D.

## 2018-10-19 NOTE — ED Provider Notes (Signed)
[facility] EMERGENCY DEPARTMENT Provider Note   CSN: [id number] Arrival date & time: 10/19/18  1156     History   Chief Complaint Chief Complaint  Patient presents with  . Fall    HPI [name] [name] is a 85 y.o. female.     85 year old female presents with mechanical fall just prior to arrival.  Patient denies any head or neck trauma.  Complains of dull pain to her left hip that is worse with any movement.  Was unable to ambulate.  Denies any back discomfort.  No distal numbness or tingling to her left foot.  EMS called and patient given fentanyl and she was placed in a binder and transported here.     Past Medical History:  Diagnosis Date  . Aortic stenosis, severe   . CKD (chronic kidney disease)   . Complication of anesthesia   . Coronary artery disease    a. 12/20/2016: s/p DES to LAD   . Diverticulosis   . GERD (gastroesophageal reflux disease)   . Hemorrhoids   . Hyperlipidemia   . Hypertension   . Hypothyroidism   . Osteoarthritis   . Paroxysmal atrial fibrillation (HCC)    a. on Time with Xarelto but switched to Eliquis after stenting.   . Presence of permanent cardiac pacemaker   . S/P TAVR (transcatheter aortic valve replacement) 01/29/2017   23 mm Edwards Sapien 3 transcatheter heart valve placed via percutaneous right transfemoral approach   . Sick sinus syndrome ([place])    a. s/p PPM (MDT)    Patient Active Problem List   Diagnosis Date Noted  . Sick sinus syndrome ([place])   . Presence of permanent cardiac pacemaker   . Paroxysmal atrial fibrillation (HCC)   . Osteoarthritis   . Hypothyroidism   . Hypertension   . Hyperlipidemia   . Hemorrhoids   . GERD (gastroesophageal reflux disease)   . Diverticulosis   . Coronary artery disease   . Complication of anesthesia   . Aortic stenosis, severe   . Pericarditis 01/31/2017  . Acute on chronic diastolic heart failure ([place]) 01/30/2017  . Acute blood loss as cause of postoperative  anemia 01/30/2017  . S/P TAVR (transcatheter aortic valve replacement) 01/29/2017  . Anemia   . CKD (chronic kidney disease)   . CAD in native artery 12/20/2016  . Anticoagulated 05/31/2016  . Pacemaker-Medtronic 01/01/2012  . Long term current use of anticoagulant therapy 06/18/2011  . Severe aortic stenosis 09/01/2010  . BRADYCARDIA-TACHYCARDIA SYNDROME 03/31/2010  . HLD (hyperlipidemia) 03/30/2010  . Essential hypertension 03/30/2010  . ATRIAL FIBRILLATION 03/30/2010  . OSTEOARTHRITIS 03/30/2010    Past Surgical History:  Procedure Laterality Date  . ABDOMINAL HYSTERECTOMY  1980  . BASAL CELL CARCINOMA EXCISION    . CARDIAC CATHETERIZATION  ~ 11/2016  . CATARACT EXTRACTION W/ INTRAOCULAR LENS  IMPLANT, BILATERAL Bilateral   . CORONARY ANGIOPLASTY WITH STENT PLACEMENT  12/20/2016   "1 stent"  . CORONARY STENT INTERVENTION N/A 12/20/2016   Procedure: CORONARY STENT INTERVENTION;  Surgeon: Martinique, Peter M, MD;  Location: [facility];  Service: Cardiovascular;  Laterality: N/A;  . FINGER SURGERY Left    "fell; developed skiers thumb; had to operate on it"  . INSERT / REPLACE / [place]   for syptomatic bradycardia and syncope -- in [facility]  . LEAD REVISION/REPAIR N/A 10/09/2016   New left subclavian MDT Adapta L PPM dual chamber system implanted by Dr [name] with previously placed  R subclavian system abandoned  . PACEMAKER GENERATOR CHANGE  2001   pulse generator replacement by Dr. Rollene Fare   . PACEMAKER GENERATOR CHANGE  04/01/2008   PPM Medtronic -- model # [id number] serial # [id number] -- pulse generator replacement by Dr. Verlon Setting   . RIGHT/LEFT HEART CATH AND CORONARY ANGIOGRAPHY N/A 11/27/2016   Procedure: RIGHT/LEFT HEART CATH AND CORONARY ANGIOGRAPHY;  Surgeon: Martinique, Peter M, MD;  Location: [facility];  Service: Cardiovascular;  Laterality: N/A;  . SQUAMOUS CELL CARCINOMA EXCISION    . TEE WITHOUT CARDIOVERSION N/A 01/29/2017   Procedure:  TRANSESOPHAGEAL ECHOCARDIOGRAM (TEE);  Surgeon: Sherren Mocha, MD;  Location: [facility];  Service: Open Heart Surgery;  Laterality: N/A;  . TONSILLECTOMY    . TRANSCATHETER AORTIC VALVE REPLACEMENT, TRANSFEMORAL N/A 01/29/2017   Procedure: TRANSCATHETER AORTIC VALVE REPLACEMENT, TRANSFEMORAL;  Surgeon: Sherren Mocha, MD;  Location: [facility];  Service: Open Heart Surgery;  Laterality: N/A;     OB History   No obstetric history on file.      Home Medications    Prior to Admission medications   Medication Sig Start Date End Date Taking? Authorizing Provider  acetaminophen (TYLENOL) 500 MG tablet Take 1,000 mg by mouth every 4 (four) hours as needed for moderate pain or headache.     [provider]  amiodarone (PACERONE) 200 MG tablet Take 0.5 tablets (100 mg total) by mouth daily. 02/05/18   Martinique, Peter M, MD  amLODipine (NORVASC) 5 MG tablet Take 1 tablet (5 mg total) by mouth daily. 10/13/18 10/08/19  Martinique, Peter M, MD  calcium carbonate (OS-CAL) 600 MG TABS Take 600 mg by mouth 2 (two) times daily with a meal.     [provider]  clindamycin (CLEOCIN) 300 MG capsule Take 2 tablets by mouth one hour prior to any dental appointments and stomach or bladder procedure. 02/06/17   Eileen Stanford, PA-C  dicyclomine (BENTYL) 10 MG capsule Take 10 mg by mouth 2 (two) times daily as needed for spasms.  06/26/16   [provider]  ELIQUIS 2.5 MG TABS tablet TAKE 1 TABLET(2.5 MG) BY MOUTH TWICE DAILY 08/29/18   Martinique, Peter M, MD  ezetimibe (ZETIA) 10 MG tablet Take 1 tablet (10 mg total) by mouth daily. 10/13/18 10/08/19  Martinique, Peter M, MD  furosemide (LASIX) 20 MG tablet TAKE 1 TABLET BY MOUTH EVERY DAY; EXCEPT ON MONDAY AND FRIDAY 10/13/18   Martinique, Peter M, MD  loratadine (CLARITIN) 10 MG tablet Take 10 mg by mouth daily as needed for allergies.    [provider]  losartan (COZAAR) 25 MG tablet Take 1 tablet (25 mg total) by mouth daily. 08/20/18 11/18/18  Martinique,  Peter M, MD  metoprolol tartrate (LOPRESSOR) 25 MG tablet Take 1.5 tablets (37.5 mg total) by mouth 2 (two) times daily. 07/15/18   Almyra Deforest, PA  nitroGLYCERIN (NITROSTAT) 0.4 MG SL tablet Place 1 tablet (0.4 mg total) under the tongue every 5 (five) minutes as needed for chest pain. 08/27/18   Martinique, Peter M, MD  pantoprazole (PROTONIX) 40 MG tablet Take 1 tablet (40 mg total) by mouth daily. 02/05/18   Martinique, Peter M, MD  polyvinyl alcohol (LIQUIFILM TEARS) 1.4 % ophthalmic solution Place 1-2 drops into both eyes 3 (three) times daily as needed for dry eyes.    [provider]  sodium chloride (OCEAN) 0.65 % SOLN nasal spray Place 1 spray into both nostrils as needed for congestion.    [provider]  SYNTHROID 125 MCG tablet  08/04/18   [provider]    Family History Family History  Problem Relation Age of Onset  . Cancer Father        lung cancer  . Alzheimer's disease Mother     Social History Social History   Tobacco Use  . Smoking status: Former Smoker    Packs/day: 1.50    Years: 30.00    Pack years: 45.00    Types: Cigarettes    Quit date: 03/05/1978    Years since quitting: 40.6  . Smokeless tobacco: Never Used  Substance Use Topics  . Alcohol use: No  . Drug use: No     Allergies   Penicillins, Tetanus toxoid, Demerol, and Codeine   Review of Systems Review of Systems  All other systems reviewed and are negative.    Physical Exam Updated Vital Signs BP (!) 147/64 (BP Location: Right Arm)   Pulse 61   Temp 97.7 F (36.5 C) (Oral)   Resp 18   Ht 1.651 m (5\' 5" )   Wt 55.3 kg   SpO2 94%   BMI 20.30 kg/m   Physical Exam Vitals signs and nursing note reviewed.  Constitutional:      General: She is not in acute distress.    Appearance: Normal appearance. She is well-developed. She is not toxic-appearing.  HENT:     Head: Normocephalic and atraumatic.  Eyes:     General: Lids are normal.     Conjunctiva/sclera:  Conjunctivae normal.     Pupils: Pupils are equal, round, and reactive to light.  Neck:     Musculoskeletal: Normal range of motion and neck supple.     Thyroid: No thyroid mass.     Trachea: No tracheal deviation.  Cardiovascular:     Rate and Rhythm: Normal rate and regular rhythm.     Heart sounds: Normal heart sounds. No murmur. No gallop.   Pulmonary:     Effort: Pulmonary effort is normal. No respiratory distress.     Breath sounds: Normal breath sounds. No stridor. No decreased breath sounds, wheezing, rhonchi or rales.  Abdominal:     General: Bowel sounds are normal. There is no distension.     Palpations: Abdomen is soft.     Tenderness: There is no abdominal tenderness. There is no rebound.  Musculoskeletal:     Left hip: She exhibits decreased range of motion, tenderness and bony tenderness.       Legs:  Skin:    General: Skin is warm and dry.     Findings: No abrasion or rash.  Neurological:     Mental Status: She is alert and oriented to person, place, and time.     GCS: GCS eye subscore is 4. GCS verbal subscore is 5. GCS motor subscore is 6.     Cranial Nerves: No cranial nerve deficit.     Sensory: No sensory deficit.  Psychiatric:        Speech: Speech normal.        Behavior: Behavior normal.      ED Treatments / Results  Labs (all labs ordered are listed, but only abnormal results are displayed) Labs Reviewed  SARS CORONAVIRUS 2 (HOSPITAL ORDER, [facility])  CBC WITH DIFFERENTIAL/PLATELET  BASIC METABOLIC PANEL  TYPE AND SCREEN    EKG None  Radiology No results found.  Procedures Procedures (including critical care time)  Medications Ordered in ED Medications  0.9 %  sodium chloride infusion (has no administration in time range)     Initial Impression / Assessment and Plan / ED Course  I have reviewed the triage vital signs and the nursing notes.  Pertinent labs & imaging results that were available during  my care of the patient were reviewed by me and considered in my medical decision making (see chart for details).        Patient medicated for pain here.  X-ray consistent with left intertrochanteric fracture.  Will consult orthopedics as well as hospitalist for admission  Final Clinical Impressions(s) / ED Diagnoses   Final diagnoses:  Fall    ED Discharge Orders    None       Lacretia Leigh, MD 10/19/18 1453

## 2018-10-19 NOTE — Transfer of Care (Signed)
Immediate Anesthesia Transfer of Care Note  Patient: [name] [name]  Procedure(s) Performed: INTRAMEDULLARY (IM) NAIL INTERTROCHANTRIC (Left )  Patient Location: PACU  Anesthesia Type:General  Level of Consciousness: oriented, drowsy, patient cooperative and responds to stimulation  Airway & Oxygen Therapy: Patient Spontanous Breathing and Patient connected to nasal cannula oxygen  Post-op Assessment: Report given to RN, Post -op Vital signs reviewed and stable and Patient moving all extremities X 4  Post vital signs: Reviewed and stable  Last Vitals:  Vitals Value Taken Time  BP 145/96 10/19/18 1943  Temp 36.9 C 10/19/18 1941  Pulse 65 10/19/18 1944  Resp 15 10/19/18 1944  SpO2 97 % 10/19/18 1944  Vitals shown include unvalidated device data.  Last Pain:  Vitals:   10/19/18 1654  TempSrc:   PainSc: 6       Patients Stated Pain Goal: 3 (11/73/56 7014)  Complications: No apparent anesthesia complications

## 2018-10-20 ENCOUNTER — Other Ambulatory Visit: Payer: Self-pay

## 2018-10-20 ENCOUNTER — Encounter (HOSPITAL_COMMUNITY): Payer: Self-pay | Admitting: Orthopedic Surgery

## 2018-10-20 DIAGNOSIS — D62 Acute posthemorrhagic anemia: Secondary | ICD-10-CM

## 2018-10-20 DIAGNOSIS — I951 Orthostatic hypotension: Secondary | ICD-10-CM

## 2018-10-20 DIAGNOSIS — E785 Hyperlipidemia, unspecified: Secondary | ICD-10-CM

## 2018-10-20 DIAGNOSIS — S72002D Fracture of unspecified part of neck of left femur, subsequent encounter for closed fracture with routine healing: Secondary | ICD-10-CM

## 2018-10-20 LAB — BASIC METABOLIC PANEL
Anion gap: 7 (ref 5–15)
BUN: 20 mg/dL (ref 8–23)
CO2: 27 mmol/L (ref 22–32)
Calcium: 8.5 mg/dL — ABNORMAL LOW (ref 8.9–10.3)
Chloride: 102 mmol/L (ref 98–111)
Creatinine, Ser: 1.12 mg/dL — ABNORMAL HIGH (ref 0.44–1.00)
GFR calc Af Amer: 51 mL/min — ABNORMAL LOW (ref >60)
GFR calc non Af Amer: 44 mL/min — ABNORMAL LOW (ref >60)
Glucose, Bld: 170 mg/dL — ABNORMAL HIGH (ref 70–99)
Potassium: 5.1 mmol/L (ref 3.5–5.1)
Sodium: 136 mmol/L (ref 135–145)

## 2018-10-20 LAB — CBC
HCT: 31.1 % — ABNORMAL LOW (ref 36.0–46.0)
Hemoglobin: 9.8 g/dL — ABNORMAL LOW (ref 12.0–15.0)
MCH: 30.9 pg (ref 26.0–34.0)
MCHC: 31.5 g/dL (ref 30.0–36.0)
MCV: 98.1 fL (ref 80.0–100.0)
Platelets: 160 10*3/uL (ref 150–400)
RBC: 3.17 MIL/uL — ABNORMAL LOW (ref 3.87–5.11)
RDW: 15.1 % (ref 11.5–15.5)
WBC: 9.4 10*3/uL (ref 4.0–10.5)
nRBC: 0 % (ref 0.0–0.2)

## 2018-10-20 MED ORDER — APIXABAN 2.5 MG PO TABS
2.5000 mg | ORAL_TABLET | Freq: Two times a day (BID) | ORAL | Status: DC
  Administered 2018-10-20 – 2018-10-29 (×19): 2.5 mg via ORAL
  Filled 2018-10-20 (×19): qty 1

## 2018-10-20 MED ORDER — METOPROLOL TARTRATE 25 MG PO TABS
37.5000 mg | ORAL_TABLET | Freq: Two times a day (BID) | ORAL | Status: DC
  Administered 2018-10-21: 11:00:00 37.5 mg via ORAL
  Filled 2018-10-20: qty 2

## 2018-10-20 MED ORDER — ENSURE ENLIVE PO LIQD
237.0000 mL | Freq: Two times a day (BID) | ORAL | Status: DC
  Administered 2018-10-23 – 2018-10-29 (×10): 237 mL via ORAL

## 2018-10-20 MED ORDER — ACETAMINOPHEN 325 MG PO TABS
650.0000 mg | ORAL_TABLET | Freq: Four times a day (QID) | ORAL | Status: DC
  Administered 2018-10-20 – 2018-10-21 (×3): 650 mg via ORAL
  Administered 2018-10-21: 325 mg via ORAL
  Administered 2018-10-22 – 2018-10-29 (×23): 650 mg via ORAL
  Filled 2018-10-20 (×28): qty 2

## 2018-10-20 NOTE — Evaluation (Signed)
Occupational Therapy Evaluation Patient Details Name: [name] [name] MRN: [id number] DOB: [birth year] Today's Date: 10/20/2018    History of Present Illness Pt is an 85 y.o. female admitted 10/19/18 after fall when stepping off curb sustaining L intertrochanteric hip fx; s/p IM nailing 8/16. PMH includes pacemaker, CAD, HTN, CHF, CKD 3.   Clinical Impression   PTA, pt was living alone and was independent; enjoys gardening and reading. Pt currently requiring Min-Mod A for LB ADLs and Min A for functional mobility with RW. Pt presenting with decreased strength and balance. Pt reporting dizziness and limited by hypotension (see BP values below). Pt reporting her brother or sister can stay at dc for support. Pt would benefit from further acute OT to facilitate safe dc. Recommend dc to home with HHOT for further OT to optimize safety, independence with ADLs, and return to PLOF.   Orthostatic BPs  Sitting edge of bed 124/78  Standing 96/45  Seated with legs reclined 117/48      Follow Up Recommendations  Home health OT;Supervision/Assistance - 24 hour    Equipment Recommendations  None recommended by OT    Recommendations for Other Services PT consult     Precautions / Restrictions Precautions Precautions: Fall Precaution Comments: Watch BP Restrictions Weight Bearing Restrictions: Yes LLE Weight Bearing: Weight bearing as tolerated      Mobility Bed Mobility Overal bed mobility: Needs Assistance Bed Mobility: Supine to Sit     Supine to sit: Mod assist;HOB elevated     General bed mobility comments: ModA to assist LLE to EOB, frequent cues for technique/sequencing; heavy reliance on bed rail  Transfers Overall transfer level: Needs assistance Equipment used: Rolling walker (2 wheeled) Transfers: Sit to/from Stand Sit to Stand: Min assist         General transfer comment: MinA to assist trunk elevation; pt with posterior lean requiring assist to steady RW. Dizzy and  tremulous upon sitting EOB    Balance Overall balance assessment: Needs assistance Sitting-balance support: No upper extremity supported;Feet supported Sitting balance-Leahy Scale: Fair       Standing balance-Leahy Scale: Poor Standing balance comment: Reliant on UE support                           ADL either performed or assessed with clinical judgement   ADL Overall ADL's : Needs assistance/impaired Eating/Feeding: Independent   Grooming: Set up;Sitting   Upper Body Bathing: Set up;Sitting   Lower Body Bathing: Minimal assistance;Sit to/from stand   Upper Body Dressing : Set up;Sitting   Lower Body Dressing: Moderate assistance;Sit to/from stand   Toilet Transfer: Min guard;Stand-pivot;RW(simulated to recliner)           Functional mobility during ADLs: Minimal assistance;Rolling walker General ADL Comments: Pt presenting with decreased strength and balance. BP dropping with activity limiting functional performance     Vision         Perception     Praxis      Pertinent Vitals/Pain Pain Assessment: Faces Faces Pain Scale: Hurts little more Pain Location: LLE Pain Descriptors / Indicators: Discomfort;Grimacing Pain Intervention(s): Monitored during session;Repositioned;Ice applied     Hand Dominance Right   Extremity/Trunk Assessment Upper Extremity Assessment Upper Extremity Assessment: Generalized weakness   Lower Extremity Assessment Lower Extremity Assessment: Defer to PT evaluation LLE Deficits / Details: Functionally at least 3/5; limited by pain LLE: Unable to fully assess due to pain LLE Coordination: decreased fine motor;decreased gross motor  Cervical / Trunk Assessment Cervical / Trunk Assessment: Normal   Communication Communication Communication: No difficulties   Cognition Arousal/Alertness: Awake/alert Behavior During Therapy: WFL for tasks assessed/performed Overall Cognitive Status: Within Functional Limits for  tasks assessed                                     General Comments  HR 60s. SpO2 99% on RA. BP sitting at EOB 124/78, sitting in recliner after transfer 96/45, and then sitting in recliner with BLEs elevated and reclined 117/48    Exercises     Shoulder Instructions      Home Living Family/patient expects to be discharged to:: Private residence Living Arrangements: Alone Available Help at Discharge: Family;Friend(s);Available 24 hours/day Type of Home: House([facility]) Home Access: Stairs to enter Entrance Stairs-Number of Steps: 4 Entrance Stairs-Rails: Right;Left Home Layout: Two level;Able to live on main level with bedroom/bathroom Alternate Level Stairs-Number of Steps: Flight   Bathroom Shower/Tub: Occupational psychologist: Handicapped height     Home Equipment: Shower seat   Additional Comments: Sister can likely stay with pt a few days to provide initial 24/7 assist      Prior Functioning/Environment Level of Independence: Independent        Comments: Drives, independent without DME.Does not do cleaning. Enjoys reading, gardening. Sometimes limited by arthritis in hip/lower back. Denies any falls before this one leading to admission        OT Problem List: Decreased strength;Decreased range of motion;Decreased activity tolerance;Impaired balance (sitting and/or standing);Decreased knowledge of use of DME or AE;Decreased knowledge of precautions;Pain      OT Treatment/Interventions: Self-care/ADL training;Therapeutic exercise;Energy conservation;DME and/or AE instruction;Therapeutic activities;Patient/family education    OT Goals(Current goals can be found in the care plan section) Acute Rehab OT Goals Patient Stated Goal: Return home with family support OT Goal Formulation: With patient Time For Goal Achievement: 11/03/18 Potential to Achieve Goals: Good  OT Frequency: Min 2X/week   Barriers to D/C:            Co-evaluation  PT/OT/SLP Co-Evaluation/Treatment: Yes Reason for Co-Treatment: For patient/therapist safety;To address functional/ADL transfers   OT goals addressed during session: ADL's and self-care      AM-PAC OT "6 Clicks" Daily Activity     Outcome Measure Help from another person eating meals?: None Help from another person taking care of personal grooming?: None Help from another person toileting, which includes using toliet, bedpan, or urinal?: A Little Help from another person bathing (including washing, rinsing, drying)?: A Little Help from another person to put on and taking off regular upper body clothing?: None Help from another person to put on and taking off regular lower body clothing?: A Lot 6 Click Score: 20   End of Session Equipment Utilized During Treatment: Rolling walker;Gait belt Nurse Communication: Mobility status(BP)  Activity Tolerance: Patient tolerated treatment well Patient left: in chair;with call bell/phone within reach  OT Visit Diagnosis: Unsteadiness on feet (R26.81);Other abnormalities of gait and mobility (R26.89);Muscle weakness (generalized) (M62.81);Pain Pain - Right/Left: Left Pain - part of body: Leg                Time: 0973-5329 OT Time Calculation (min): 29 min Charges:  OT General Charges $OT Visit: 1 Visit OT Evaluation $OT Eval Low Complexity: [place], OTR/L Acute Rehab Pager: [phone] Office: [place] 10/20/2018, 8:46 AM

## 2018-10-20 NOTE — Evaluation (Signed)
Physical Therapy Evaluation Patient Details Name: [name] [name] MRN: [id number] DOB: [birth year] Today's Date: 10/20/2018   History of Present Illness  Pt is an 85 y.o. female admitted 10/19/18 after fall when stepping off curb sustaining L intertrochanteric hip fx; s/p IM nailing 8/16. PMH includes pacemaker, CAD, HTN, CHF, CKD 3.    Clinical Impression  Pt presents with an overall decrease in functional mobility secondary to above. PTA, pt independent, drives and lives alone. Today, pt able to initiate standing and gait training with RW and minA; pt limited by dizziness and hypotension (see BP values below); HR 60, SpO2 99% on RA. Expect pt to progress well with mobility once symptoms improved; reports family will likely be able to provide initial 24/7 support at home. Pt would benefit from continued acute PT services to maximize functional mobility and independence prior to d/c with HHPT services.   Orthostatic BPs  Sitting edge of bed 124/78  Standing 96/45  Seated with legs reclined 117/48      Follow Up Recommendations Home health PT;Supervision/Assistance - 24 hour    Equipment Recommendations  (pt reports can borrow RW)    Recommendations for Other Services       Precautions / Restrictions Precautions Precautions: Fall Restrictions Weight Bearing Restrictions: Yes LLE Weight Bearing: Weight bearing as tolerated      Mobility  Bed Mobility Overal bed mobility: Needs Assistance Bed Mobility: Supine to Sit     Supine to sit: Mod assist;HOB elevated     General bed mobility comments: ModA to assist LLE to EOB, frequent cues for technique/sequencing; heavy reliance on bed rail  Transfers Overall transfer level: Needs assistance Equipment used: Rolling walker (2 wheeled) Transfers: Sit to/from Stand Sit to Stand: Min assist         General transfer comment: MinA to assist trunk elevation; pt with posterior lean requiring assist to steady RW. Dizzy and tremulous  upon sitting EOB  Ambulation/Gait Ambulation/Gait assistance: Min assist Gait Distance (Feet): 2 Feet Assistive device: Rolling walker (2 wheeled) Gait Pattern/deviations: Step-to pattern;Leaning posteriorly;Trunk flexed;Antalgic     General Gait Details: Pivotal steps from bed to recliner with RW and minA; further distance limited by dizziness and hypotension  Stairs            Wheelchair Mobility    Modified Rankin (Stroke Patients Only)       Balance Overall balance assessment: Needs assistance   Sitting balance-Leahy Scale: Fair       Standing balance-Leahy Scale: Poor Standing balance comment: Reliant on UE support                             Pertinent Vitals/Pain      Home Living Family/patient expects to be discharged to:: Private residence Living Arrangements: Alone Available Help at Discharge: Family;Friend(s);Available 24 hours/day   Home Access: Stairs to enter Entrance Stairs-Rails: Right;Left Entrance Stairs-Number of Steps: 4 Home Layout: Two level;Able to live on main level with bedroom/bathroom Home Equipment: Shower seat Additional Comments: Sister can likely stay with pt a few days to provide initial 24/7 assist    Prior Function Level of Independence: Independent         Comments: Drives, independent without DME.Does not do cleaning. Enjoys reading, gardening. Sometimes limited by arthritis in hip/lower back. Denies any falls before this one leading to admission     Hand Dominance        Extremity/Trunk Assessment   Upper  Extremity Assessment Upper Extremity Assessment: Generalized weakness    Lower Extremity Assessment Lower Extremity Assessment: LLE deficits/detail LLE Deficits / Details: Functionally at least 3/5; limited by pain LLE: Unable to fully assess due to pain LLE Coordination: decreased fine motor;decreased gross motor       Communication   Communication: No difficulties  Cognition  Arousal/Alertness: Awake/alert Behavior During Therapy: WFL for tasks assessed/performed Overall Cognitive Status: Within Functional Limits for tasks assessed                                        General Comments      Exercises     Assessment/Plan    PT Assessment Patient needs continued PT services  PT Problem List Decreased strength;Decreased range of motion;Decreased activity tolerance;Decreased balance;Decreased mobility;Decreased knowledge of use of DME;Decreased knowledge of precautions;Pain       PT Treatment Interventions DME instruction;Gait training;Stair training;Functional mobility training;Therapeutic activities;Therapeutic exercise;Balance training;Patient/family education    PT Goals (Current goals can be found in the Care Plan section)  Acute Rehab PT Goals Patient Stated Goal: Return home with family support PT Goal Formulation: With patient Time For Goal Achievement: 11/03/18 Potential to Achieve Goals: Good    Frequency Min 5X/week   Barriers to discharge        Co-evaluation PT/OT/SLP Co-Evaluation/Treatment: Yes Reason for Co-Treatment: For patient/therapist safety;To address functional/ADL transfers           AM-PAC PT "6 Clicks" Mobility  Outcome Measure Help needed turning from your back to your side while in a flat bed without using bedrails?: A Lot Help needed moving from lying on your back to sitting on the side of a flat bed without using bedrails?: A Lot Help needed moving to and from a bed to a chair (including a wheelchair)?: A Little Help needed standing up from a chair using your arms (e.g., wheelchair or bedside chair)?: A Little Help needed to walk in hospital room?: A Little Help needed climbing 3-5 steps with a railing? : A Lot 6 Click Score: 15    End of Session Equipment Utilized During Treatment: Gait belt Activity Tolerance: Patient tolerated treatment well;Treatment limited secondary to medical  complications (Comment) Patient left: in chair;with call bell/phone within reach;with chair alarm set Nurse Communication: Mobility status PT Visit Diagnosis: Other abnormalities of gait and mobility (R26.89);Pain Pain - Right/Left: Left Pain - part of body: Leg    Time: 0803-0829 PT Time Calculation (min) (ACUTE ONLY): 26 min   Charges:   PT Evaluation $PT Eval Moderate Complexity: 1 Mod     Mabeline Caras, PT, DPT Acute Rehabilitation Services  Pager [phone] Office [place] 10/20/2018, 8:29 AM

## 2018-10-20 NOTE — Progress Notes (Addendum)
Orthopedic Trauma Service Progress Note  Patient ID: [name] [name] MRN: [id number] DOB/AGE: [birth year] 85 y.o.  Subjective:  Doing very well Not having any pain Sat in a chair for several hours  No other issues of note   ROS As above  Objective:   VITALS:   Vitals:   10/20/18 0019 10/20/18 0500 10/20/18 0855 10/20/18 0857  BP: (!) 118/52 (!) 118/52 (!) 92/44 (!) 92/44  Pulse: 60 60 72   Resp: 17 18    Temp: 98.2 F (36.8 C) 98.4 F (36.9 C)    TempSrc:      SpO2: 98% 100%    Weight:      Height:        Estimated body mass index is 20.3 kg/m as calculated from the following:   Height as of this encounter: 5\' 5"  (1.651 m).   Weight as of this encounter: 55.3 kg.   Intake/Output      08/16 0701 - 08/17 0700 08/17 0701 - 08/18 0700   P.O. 100    I.V. (mL/kg) 1000 (18.1)    Total Intake(mL/kg) 1100 (19.9)    Urine (mL/kg/hr) 250    Blood 50    Total Output 300    Net +800           LABS  Results for orders placed or performed during the hospital encounter of 10/19/18 (from the past 24 hour(s))  CBC     Status: Abnormal   Collection Time: 10/20/18  3:21 AM  Result Value Ref Range   WBC 9.4 4.0 - 10.5 K/uL   RBC 3.17 (L) 3.87 - 5.11 MIL/uL   Hemoglobin 9.8 (L) 12.0 - 15.0 g/dL   HCT 31.1 (L) 36.0 - 46.0 %   MCV 98.1 80.0 - 100.0 fL   MCH 30.9 26.0 - 34.0 pg   MCHC 31.5 30.0 - 36.0 g/dL   RDW 15.1 11.5 - 15.5 %   Platelets 160 150 - 400 K/uL   nRBC 0.0 0.0 - 0.2 %  Basic metabolic panel     Status: Abnormal   Collection Time: 10/20/18  3:21 AM  Result Value Ref Range   Sodium 136 135 - 145 mmol/L   Potassium 5.1 3.5 - 5.1 mmol/L   Chloride 102 98 - 111 mmol/L   CO2 27 22 - 32 mmol/L   Glucose, Bld 170 (H) 70 - 99 mg/dL   BUN 20 8 - 23 mg/dL   Creatinine, Ser 1.12 (H) 0.44 - 1.00 mg/dL   Calcium 8.5 (L) 8.9 - 10.3 mg/dL   GFR calc non Af Amer 44 (L) >60 mL/min   GFR calc  Af Amer 51 (L) >60 mL/min   Anion gap 7 5 - 15     PHYSICAL EXAM:   Gen: Sitting up in bed, friend at bedside, no acute distress, very pleasant Lungs: Unlabored Cardiac: Regular rate and rhythm Abd:+ Bowel sounds, nontender Ext:       Left lower extremity  Dressings are clean, dry and intact  Swelling controlled  Ext warm   + DP pulse  No DCT   Compartments are soft  EHL, FHL, lesser toe motor function are intact.  Ankle flexion, extension, inversion and eversion are intact.  Patient can perform a quad set  DPN, SPN, TN sensory functions are  intact  Assessment/Plan: 1 Day Post-Op   Principal Problem:   Hip fracture (HCC) Active Problems:   HLD (hyperlipidemia)   Essential hypertension   ATRIAL FIBRILLATION   CKD (chronic kidney disease)   S/P TAVR (transcatheter aortic valve replacement)   Hypothyroidism   Anti-infectives (From admission, onward)   Start     Dose/Rate Route Frequency Ordered Stop   10/19/18 2130  clindamycin (CLEOCIN) IVPB 600 mg     600 mg 100 mL/hr over 30 Minutes Intravenous Every 6 hours 10/19/18 2122 10/20/18 0612   10/19/18 1721  vancomycin (VANCOCIN) 1-5 GM/200ML-% IVPB    Note to Pharmacy: [name] [name]   : cabinet override      10/19/18 1721 10/20/18 0529    .  POD/HD#: 48  85 year old female s/p with left hip fracture on chronic Eliquis  -Left hip fracture s/p intramedullary nail  Weight-bear as tolerated  Range of motion as tolerated  Dressing changes as needed starting tomorrow  Ice as needed for swelling and pain control  TED hose for swelling if patient would like  PT and OT evaluations   Patient is independent ambulator prior to this admission  - Pain management:  Scheduled Tylenol  Low-dose Norco if needed  - ABL anemia/Hemodynamics  Monitor  CBC in the morning  Currently stable  - Medical issues   Per medical service   Eliquis resumed  - DVT/PE prophylaxis:  eliquis as above   - ID:   Perioperative  antibiotics  - Metabolic Bone Disease:  Vitamin D levels are pending  Recommend outpatient DEXA scan in the next 4 to 8 weeks  Mechanism suggestive of osteoporosis  - Activity:  Up with assistance  Weight-bear as tolerated with walker  - FEN/GI prophylaxis/Foley/Lines:  Regular diet   - Impediments to fracture healing:  Osteoporosis  Chronic medical comorbidities including thyroid dysfunction, CKD  Lasix use  - Dispo:  Continue with therapies  Ortho issues stable  May possibly be ready for discharge tomorrow afternoon  Follow-up with orthopedics in 2 weeks    Jari Pigg, PA-C [phone] (C) 10/20/2018, 2:16 PM  [facility] [place] [address] [phone] [name] [name] (F)

## 2018-10-20 NOTE — Progress Notes (Addendum)
PROGRESS NOTE   [name] [name]  YNW:[id number]    DOB: [birth year]    DOA: 10/19/2018  PCP: [name], MD   I have briefly reviewed patients previous medical records in [facility].  Chief Complaint  Patient presents with  . Fall    Brief Narrative:  85 year old female patient, lives alone independently, PMH of CAD s/p DES to LAD 12/20/2016, PAF on Eliquis, PPM, s/p TAVR 65/78/4696, chronic diastolic CHF, HTN, HLD, hypothyroid, GERD and CKD 3 admitted following mechanical fall leading to left hip fracture, s/p IM nailing/ORIF by orthopedics 8/16.  Mild orthostatic symptoms 8/17.   Assessment & Plan:   Principal Problem:   Hip fracture ([place]) Active Problems:   HLD (hyperlipidemia)   Essential hypertension   ATRIAL FIBRILLATION   CKD (chronic kidney disease)   S/P TAVR (transcatheter aortic valve replacement)   Hypothyroidism   Left intertrochanteric femur/hip fracture, s/p ORIF (IM nailing) 10/19/2018  Sustained s/p mechanical fall  Orthopedics consulted and s/p IM nailing 8/16  WBAT, resume prior home dose of Eliquis.  Therapies recommend home health PT and OT.  Outpatient orthopedic follow-up in 2 weeks for suture removal and further evaluation.  Orthostatic hypotension  Noted on 8/17 morning.  BP dropped from 124/78 sitting >96/45 standing.  Hold antihypertensives and reassess.   Could be contributed by meds (antihypertensives, opioids) and ABLA  Postop acute blood loss anemia  Hemoglobin dropped from 12 preop to 9.8 postop.  Follow CBC daily and transfuse if hemoglobin 7 g or less.  Paroxysmal A. fib/PPM  Rate controlled.  Continue amiodarone.  Holding metoprolol this morning due to orthostatic hypotension.  Eliquis held preoperatively and resumed 2.5 mg twice daily on POD #1  Essential hypertension  As stated above, holding antihypertensives this morning due to orthostatic hypotension.  DC losartan for today.  Hyperlipidemia  Continue  Zetia.  Stage III CKD  Creatinine remains at baseline 1.1.  Hypothyroid  Continue Synthroid.  CAD/s/p TAVR/chronic diastolic CHF  Stable without angina and clinically euvolemic.  Hold home dose of Lasix 20 mg daily for today and reassess in a.m.  DVT prophylaxis: Resumed Eliquis POD #1, 8/17 Code Status: DNR Family Communication: None at bedside.  Called back to patient's room and even her house but unable to reach her sister. Disposition: DC home pending clinical improvement, possibly in the next 1 to 2 days.   Consultants:  Orthopedics  Procedures:  Left hip IM nailing on 8/16  Antimicrobials:  Perioperative antibiotics   Subjective: Patient reports that she was doing well laying in bed but when she got up with therapies, she felt somewhat dizzy, feeling like she was going to pass out, nauseous and the symptoms are not unusual postoperatively from prior experience for her.  No vomiting.  No chest pain or dyspnea.  After sitting up in reclining chair for some time, feels better.  Mild and appropriate postop left hip pain.  Objective:  Vitals:   10/20/18 0019 10/20/18 0500 10/20/18 0855 10/20/18 0857  BP: (!) 118/52 (!) 118/52 (!) 92/44 (!) 92/44  Pulse: 60 60 72   Resp: 17 18    Temp: 98.2 F (36.8 C) 98.4 F (36.9 C)    TempSrc:      SpO2: 98% 100%    Weight:      Height:        Examination:  General exam: Pleasant elderly female, moderately built and nourished sitting up comfortably on reclining chair without distress.  Oral mucosa moist. Respiratory  system: Clear to auscultation. Respiratory effort normal. Cardiovascular system: S1 & S2 heard, RRR.  No JVD or pedal edema.  2/6 systolic ejection murmur. Gastrointestinal system: Abdomen is nondistended, soft and nontender. No organomegaly or masses felt. Normal bowel sounds heard. Central nervous system: Alert and oriented. No focal neurological deficits. Extremities: Symmetric 5 x 5 power.  Left hip postop  dressing clean and dry. Skin: No rashes, lesions or ulcers Psychiatry: Judgement and insight appear normal. Mood & affect appropriate.     Data Reviewed: I have personally reviewed following labs and imaging studies  CBC: Recent Labs  Lab 10/19/18 1208 10/20/18 0321  WBC 8.0 9.4  NEUTROABS 5.1  --   HGB 12.0 9.8*  HCT 38.1 31.1*  MCV 98.2 98.1  PLT 178 161   Basic Metabolic Panel: Recent Labs  Lab 10/19/18 1208 10/20/18 0321  NA 136 136  K 4.4 5.1  CL 102 102  CO2 23 27  GLUCOSE 111* 170*  BUN 28* 20  CREATININE 1.11* 1.12*  CALCIUM 9.3 8.5*   Liver Function Tests: No results for input(s): AST, ALT, ALKPHOS, BILITOT, PROT, ALBUMIN in the last 168 hours.  Cardiac Enzymes: No results for input(s): CKTOTAL, CKMB, CKMBINDEX, TROPONINI in the last 168 hours.  CBG: No results for input(s): GLUCAP in the last 168 hours.  Recent Results (from the past 240 hour(s))  SARS Coronavirus 2 [facility] order, Performed in [facility] hospital lab) Nasopharyngeal Nasopharyngeal Swab     Status: None   Collection Time: 10/19/18 12:09 PM   Specimen: Nasopharyngeal Swab  Result Value Ref Range Status   SARS Coronavirus 2 NEGATIVE NEGATIVE Final    Comment: (NOTE) If result is NEGATIVE SARS-CoV-2 target nucleic acids are NOT DETECTED. The SARS-CoV-2 RNA is generally detectable in upper and lower  respiratory specimens during the acute phase of infection. The lowest  concentration of SARS-CoV-2 viral copies this assay can detect is 250  copies / mL. A negative result does not preclude SARS-CoV-2 infection  and should not be used as the sole basis for treatment or other  patient management decisions.  A negative result may occur with  improper specimen collection / handling, submission of specimen other  than nasopharyngeal swab, presence of viral mutation(s) within the  areas targeted by this assay, and inadequate number of viral copies  (<250 copies / mL). A negative result  must be combined with clinical  observations, patient history, and epidemiological information. If result is POSITIVE SARS-CoV-2 target nucleic acids are DETECTED. The SARS-CoV-2 RNA is generally detectable in upper and lower  respiratory specimens dur ing the acute phase of infection.  Positive  results are indicative of active infection with SARS-CoV-2.  Clinical  correlation with patient history and other diagnostic information is  necessary to determine patient infection status.  Positive results do  not rule out bacterial infection or co-infection with other viruses. If result is PRESUMPTIVE POSTIVE SARS-CoV-2 nucleic acids MAY BE PRESENT.   A presumptive positive result was obtained on the submitted specimen  and confirmed on repeat testing.  While 2019 novel coronavirus  (SARS-CoV-2) nucleic acids may be present in the submitted sample  additional confirmatory testing may be necessary for epidemiological  and / or clinical management purposes  to differentiate between  SARS-CoV-2 and other Sarbecovirus currently known to infect humans.  If clinically indicated additional testing with an alternate test  methodology [phone]) is advised. The SARS-CoV-2 RNA is generally  detectable in upper and lower respiratory sp ecimens  during the acute  phase of infection. The expected result is Negative. Fact Sheet for Patients:  StrictlyIdeas.no Fact Sheet for Healthcare Providers: BankingDealers.co.za This test is not yet approved or cleared by the [place] FDA and has been authorized for detection and/or diagnosis of SARS-CoV-2 by FDA under an Emergency Use Authorization (EUA).  This EUA will remain in effect (meaning this test can be used) for the duration of the COVID-19 declaration under Section 564(b)(1) of the Act, 21 U.S.C. section 360bbb-3(b)(1), unless the authorization is terminated or revoked sooner. Performed at [facility], [employer] [address] [place], [place] [address]          Radiology Studies: Dg Chest 1 View  Result Date: 10/19/2018 CLINICAL DATA:  Fall EXAM: CHEST  1 VIEW COMPARISON:  01/29/2017 FINDINGS: Chronic cardiomegaly. Pacer leads from both sides with sacrifice on the right. Transcatheter aortic valve replacement. Chronic interstitial coarsening mild chronic lung disease/scarring on 2018 chest CT. Bronchial calcification likely contributes to the appearance. There is no edema, consolidation, effusion, or pneumothorax. No appreciable fracture. IMPRESSION: No acute finding. Electronically Signed   By: Monte Fantasia M.D.   On: 10/19/2018 14:04   Pelvis Portable  Result Date: 10/19/2018 CLINICAL DATA:  Femoral head fracture EXAM: PORTABLE PELVIS 1-2 VIEWS COMPARISON:  June 19, 2018 FINDINGS: The patient has undergone placement of an intramedullary nail through the left femur. The alignment appears stable. The hardware appears intact. There are expected postsurgical changes including subcutaneous gas and overlying soft tissue edema. There are degenerative changes of the right hip. IMPRESSION: Expected postsurgical changes related to left femur ORIF. Electronically Signed   By: Constance Holster M.D.   On: 10/19/2018 22:31   Dg C-arm 1-60 Min  Result Date: 10/19/2018 CLINICAL DATA:  ORIF of left femur fracture. EXAM: DG C-ARM 61-120 MIN COMPARISON:  Left hip radiographs 10/19/2018 FLUOROSCOPY TIME:  Fluoroscopy Time:  52 seconds FINDINGS: Seven intraoperative fluoroscopic images of the left femur are provided and demonstrate ORIF of the previously described intertrochanteric femur fracture. An intramedullary nail and proximal and distal screws have been placed, and alignment is improved. IMPRESSION: Intraoperative images during ORIF of left femur fracture. Electronically Signed   By: Logan Bores M.D.   On: 10/19/2018 20:59   Dg Hip Unilat W Or Wo Pelvis 2-3 Views Left  Result Date: 10/19/2018  CLINICAL DATA:  Fall with left hip pain EXAM: DG HIP (WITH OR WITHOUT PELVIS) 2-3V LEFT COMPARISON:  None. FINDINGS: Acute intertrochanteric left femur fracture with proximal retraction of the lesser trochanter. On the lateral view there is dorsal impaction. No hip dislocation. No evidence of pelvic ring fracture. IMPRESSION: Intertrochanteric left femur fracture. Electronically Signed   By: Monte Fantasia M.D.   On: 10/19/2018 14:02   Dg Femur Min 2 Views Left  Result Date: 10/19/2018 CLINICAL DATA:  Postop left femur fracture, ORIF intertrochanteric fracture. EXAM: LEFT FEMUR 2 VIEWS COMPARISON:  Preoperative radiographs earlier this day. FINDINGS: Intramedullary nail with trans trochanteric screw fixation of comminuted intertrochanteric femur fracture. Fractures in improved alignment compared to preoperative imaging. No periprosthetic lucency. Recent postsurgical change includes air and edema in the soft tissues. IMPRESSION: Post ORIF of intertrochanteric femur fracture without immediate postoperative complication. Electronically Signed   By: Keith Rake M.D.   On: 10/19/2018 20:51   Dg Femur Port Min 2 Views Left  Result Date: 10/19/2018 CLINICAL DATA:  ORIF of the left femur EXAM: LEFT FEMUR PORTABLE 2 VIEWS COMPARISON:  October 19, 2018  FINDINGS: There are expected postsurgical changes related to ORIF and placement of an IM nail through the left femur. There is subcutaneous gas and overlying soft tissue edema. Again noted is a fracture of the proximal left femur with stable osseous alignment. The hardware is intact. IMPRESSION: Status post ORIF of the left femur as detailed above. Electronically Signed   By: Constance Holster M.D.   On: 10/19/2018 22:35        Scheduled Meds: . amiodarone  100 mg Oral Daily  . amLODipine  5 mg Oral Daily  . calcium carbonate  625 mg Oral BID WC  . docusate sodium  100 mg Oral BID  . ezetimibe  10 mg Oral Daily  . levothyroxine  125 mcg Oral QAC  breakfast  . losartan  25 mg Oral Daily  . metoprolol tartrate  37.5 mg Oral BID  . pantoprazole  40 mg Oral Daily   Continuous Infusions: . methocarbamol (ROBAXIN) IV       LOS: 1 day     Vernell Leep, MD, FACP, [facility]. [facility]  To contact the attending provider between 7A-7P or the covering provider during after hours 7P-7A, please log into the web site [email or web address] and access using universal [place] password for that web site. If you do not have the password, please call the hospital operator.  10/20/2018, 11:31 AM

## 2018-10-20 NOTE — Anesthesia Postprocedure Evaluation (Signed)
Anesthesia Post Note  Patient: [name] [name]  Procedure(s) Performed: INTRAMEDULLARY (IM) NAIL INTERTROCHANTRIC (Left )     Patient location during evaluation: PACU Anesthesia Type: General Level of consciousness: awake and alert Pain management: pain level controlled Vital Signs Assessment: post-procedure vital signs reviewed and stable Respiratory status: spontaneous breathing, nonlabored ventilation, respiratory function stable and patient connected to nasal cannula oxygen Cardiovascular status: blood pressure returned to baseline and stable Postop Assessment: no apparent nausea or vomiting Anesthetic complications: no    Last Vitals:  Vitals:   10/19/18 2123 10/20/18 0019  BP:  (!) 118/52  Pulse:  60  Resp:  17  Temp:  36.8 C  SpO2: 100% 98%    Last Pain:  Vitals:   10/19/18 2103  TempSrc: Oral  PainSc:                  Jorgia Manthei DAVID

## 2018-10-20 NOTE — Plan of Care (Signed)
  Problem: Activity: Goal: Ability to ambulate and perform ADLs will improve Outcome: Progressing   Problem: Pain Management: Goal: Pain level will decrease Outcome: Progressing   Problem: Safety: Goal: Ability to remain free from injury will improve Outcome: Progressing   Problem: Skin Integrity: Goal: Risk for impaired skin integrity will decrease Outcome: Progressing

## 2018-10-20 NOTE — Anesthesia Preprocedure Evaluation (Signed)
Anesthesia Evaluation  Patient identified by MRN, date of birth, ID band Patient awake    Reviewed: Allergy & Precautions, NPO status , Patient's Chart, lab work & pertinent test results  History of Anesthesia Complications (+) PONV  Airway Mallampati: I  TM Distance: >3 FB Neck ROM: Full    Dental   Pulmonary former smoker,    Pulmonary exam normal        Cardiovascular hypertension, Pt. on medications + CAD and + Cardiac Stents  Normal cardiovascular exam+ pacemaker + Valvular Problems/Murmurs AS   S?P TAVR   Neuro/Psych    GI/Hepatic GERD  Medicated and Controlled,  Endo/Other    Renal/GU Renal disease     Musculoskeletal   Abdominal   Peds  Hematology   Anesthesia Other Findings   Reproductive/Obstetrics                             Anesthesia Physical Anesthesia Plan  ASA: III  Anesthesia Plan: General   Post-op Pain Management:    Induction:   PONV Risk Score and Plan: 3 and Ondansetron and Dexamethasone  Airway Management Planned: Oral ETT  Additional Equipment:   Intra-op Plan:   Post-operative Plan:   Informed Consent: I have reviewed the patients History and Physical, chart, labs and discussed the procedure including the risks, benefits and alternatives for the proposed anesthesia with the patient or authorized representative who has indicated his/her understanding and acceptance.       Plan Discussed with: CRNA and Surgeon  Anesthesia Plan Comments:         Anesthesia Quick Evaluation

## 2018-10-20 NOTE — Progress Notes (Signed)
Initial Nutrition Assessment  DOCUMENTATION CODES:   Not applicable  INTERVENTION:  Provide Ensure Enlive po BID, each supplement provides 350 kcal and 20 grams of protein  Encourage adequate PO intake.   NUTRITION DIAGNOSIS:   Increased nutrient needs related to post-op healing as evidenced by estimated needs.  GOAL:   Patient will meet greater than or equal to 90% of their needs  MONITOR:   PO intake, Supplement acceptance, Skin, Weight trends, Labs, I & O's  REASON FOR ASSESSMENT:   Consult Hip fracture protocol  ASSESSMENT:   85 year old female patient, lives alone independently, PMH of CAD s/p DES to LAD 12/20/2016, PAF on Eliquis, PPM, s/p TAVR 98/26/4158, chronic diastolic CHF, HTN, HLD, hypothyroid, GERD and CKD 3 admitted following mechanical fall leading to left hip fracture, s/p IM nailing/ORIF by orthopedics 8/16.  Pt unavailable during attempted time of contact. RD unable to obtain pt nutrition history at this time. RD to order nutritional supplements to aid in caloric and protein needs as well as in post op healing.   Unable to complete Nutrition-Focused physical exam at this time.   Labs and medications reviewed.   Diet Order:   Diet Order            Diet regular Room service appropriate? Yes; Fluid consistency: Thin  Diet effective now              EDUCATION NEEDS:   Not appropriate for education at this time  Skin:  Skin Assessment: Skin Integrity Issues: Skin Integrity Issues:: Incisions Incisions: L hip  Last BM:  8/16  Height:   Ht Readings from Last 1 Encounters:  10/19/18 5\' 5"  (1.651 m)    Weight:   Wt Readings from Last 1 Encounters:  10/19/18 55.3 kg    Ideal Body Weight:  56.8 kg  BMI:  Body mass index is 20.3 kg/m.  Estimated Nutritional Needs:   Kcal:  1500-1700  Protein:  65-75 grams  Fluid:  >/= 1.5 L/day    Corrin Parker, MS, RD, LDN Pager # [phone] After hours/ weekend pager # [phone]

## 2018-10-21 DIAGNOSIS — D696 Thrombocytopenia, unspecified: Secondary | ICD-10-CM

## 2018-10-21 DIAGNOSIS — N179 Acute kidney failure, unspecified: Secondary | ICD-10-CM

## 2018-10-21 DIAGNOSIS — N189 Chronic kidney disease, unspecified: Secondary | ICD-10-CM

## 2018-10-21 LAB — BASIC METABOLIC PANEL
Anion gap: 8 (ref 5–15)
BUN: 35 mg/dL — ABNORMAL HIGH (ref 8–23)
CO2: 26 mmol/L (ref 22–32)
Calcium: 8.5 mg/dL — ABNORMAL LOW (ref 8.9–10.3)
Chloride: 97 mmol/L — ABNORMAL LOW (ref 98–111)
Creatinine, Ser: 1.95 mg/dL — ABNORMAL HIGH (ref 0.44–1.00)
GFR calc Af Amer: 26 mL/min — ABNORMAL LOW (ref >60)
GFR calc non Af Amer: 23 mL/min — ABNORMAL LOW (ref >60)
Glucose, Bld: 119 mg/dL — ABNORMAL HIGH (ref 70–99)
Potassium: 4.6 mmol/L (ref 3.5–5.1)
Sodium: 131 mmol/L — ABNORMAL LOW (ref 135–145)

## 2018-10-21 LAB — CBC
HCT: 24.5 % — ABNORMAL LOW (ref 36.0–46.0)
Hemoglobin: 7.9 g/dL — ABNORMAL LOW (ref 12.0–15.0)
MCH: 31.5 pg (ref 26.0–34.0)
MCHC: 32.2 g/dL (ref 30.0–36.0)
MCV: 97.6 fL (ref 80.0–100.0)
Platelets: 142 10*3/uL — ABNORMAL LOW (ref 150–400)
RBC: 2.51 MIL/uL — ABNORMAL LOW (ref 3.87–5.11)
RDW: 15.8 % — ABNORMAL HIGH (ref 11.5–15.5)
WBC: 9.3 10*3/uL (ref 4.0–10.5)
nRBC: 0 % (ref 0.0–0.2)

## 2018-10-21 LAB — CREATININE, URINE, RANDOM: Creatinine, Urine: 87.14 mg/dL

## 2018-10-21 LAB — VITAMIN D 25 HYDROXY (VIT D DEFICIENCY, FRACTURES): Vit D, 25-Hydroxy: 30.9 ng/mL (ref 30.0–100.0)

## 2018-10-21 LAB — SODIUM, URINE, RANDOM: Sodium, Ur: <10 mmol/L

## 2018-10-21 LAB — PREPARE RBC (CROSSMATCH)

## 2018-10-21 LAB — CALCITRIOL (1,25 DI-OH VIT D): Vit D, 1,25-Dihydroxy: 29.6 pg/mL (ref 19.9–79.3)

## 2018-10-21 MED ORDER — FUROSEMIDE 10 MG/ML IJ SOLN
20.0000 mg | Freq: Once | INTRAMUSCULAR | Status: AC
  Administered 2018-10-21: 20 mg via INTRAVENOUS
  Filled 2018-10-21: qty 2

## 2018-10-21 MED ORDER — VITAMIN D 25 MCG (1000 UNIT) PO TABS
2000.0000 [IU] | ORAL_TABLET | Freq: Two times a day (BID) | ORAL | Status: DC
  Administered 2018-10-21 – 2018-10-29 (×16): 2000 [IU] via ORAL
  Filled 2018-10-21 (×16): qty 2

## 2018-10-21 MED ORDER — SODIUM CHLORIDE 0.9% IV SOLUTION
Freq: Once | INTRAVENOUS | Status: AC
  Administered 2018-10-21: 15:00:00 via INTRAVENOUS

## 2018-10-21 MED ORDER — DIPHENHYDRAMINE HCL 25 MG PO CAPS
25.0000 mg | ORAL_CAPSULE | Freq: Once | ORAL | Status: AC
  Administered 2018-10-21: 25 mg via ORAL
  Filled 2018-10-21: qty 1

## 2018-10-21 MED ORDER — VITAMIN C 500 MG PO TABS
500.0000 mg | ORAL_TABLET | Freq: Every day | ORAL | Status: DC
  Administered 2018-10-21 – 2018-10-29 (×9): 500 mg via ORAL
  Filled 2018-10-21 (×9): qty 1

## 2018-10-21 MED ORDER — SODIUM CHLORIDE 0.9 % IV SOLN
INTRAVENOUS | Status: AC
  Administered 2018-10-21: 12:00:00 via INTRAVENOUS

## 2018-10-21 MED ORDER — ACETAMINOPHEN 325 MG PO TABS
650.0000 mg | ORAL_TABLET | Freq: Once | ORAL | Status: AC
  Administered 2018-10-21: 15:00:00 650 mg via ORAL
  Filled 2018-10-21: qty 2

## 2018-10-21 MED ORDER — METOPROLOL TARTRATE 25 MG PO TABS
12.5000 mg | ORAL_TABLET | Freq: Two times a day (BID) | ORAL | Status: DC
  Administered 2018-10-22 – 2018-10-29 (×12): 12.5 mg via ORAL
  Filled 2018-10-21 (×16): qty 1

## 2018-10-21 NOTE — Progress Notes (Signed)
Patient O@ stats 88% on room air. Instructed to deep breath several time and O@ stats 96%on room air. Was given incentive spirometer and achieved 1250 goal is 1750 instructed [place] use often. Will continue to monitor. Arthor Captain LPN

## 2018-10-21 NOTE — Progress Notes (Signed)
Patient is receiving 1st unit of PRBCs- tolerating well but patient appears to have anxiety.  Pain medication was given as ordered and the patient was placed on 2 liters as ordered as needed.  Appears more relaxed after comfort measures.  Heart rate 73  BP 137/72  Oxygen sat 98% with 2 liter/[place]

## 2018-10-21 NOTE — Plan of Care (Signed)
  Problem: Activity: Goal: Ability to ambulate and perform ADLs will improve Outcome: Progressing   Problem: Pain Management: Goal: Pain level will decrease Outcome: Progressing   Problem: Pain Management: Goal: Pain level will decrease Outcome: Progressing   Problem: Activity: Goal: Risk for activity intolerance will decrease Outcome: Progressing   Problem: Nutrition: Goal: Adequate nutrition will be maintained Outcome: Progressing   Problem: Elimination: Goal: Will not experience complications related to bowel motility Outcome: Progressing Goal: Will not experience complications related to urinary retention Outcome: Progressing   Problem: Pain Managment: Goal: General experience of comfort will improve Outcome: Progressing   Problem: Safety: Goal: Ability to remain free from injury will improve Outcome: Progressing

## 2018-10-21 NOTE — Progress Notes (Signed)
Occupational Therapy Treatment Patient Details Name: [name] [name] MRN: [id number] DOB: Mar 20, 1929 Today's Date: 10/21/2018    History of present illness Pt is an 85 y.o. female admitted 10/19/18 after fall when stepping off curb sustaining L intertrochanteric hip fx; s/p IM nailing 8/16. PMH includes pacemaker, CAD, HTN, CHF, CKD 3.   OT comments  Pt seen for ADL progression and functional mobility. Pt with no complaints of dizziness this session. Overall, pt MIN A for ADLS and functional mobility. Requires verbal cues for safety and body mechanics during functional transfers. Pt reports having no family support at home. OT recommending CIR for dc to maximize functional independence. Feel pt could reach modified independent goals once pain is managed and pt is  in structured environment of CIR.   Follow Up Recommendations  CIR    Equipment Recommendations  Other (comment)(TBD to next venue)    Recommendations for Other Services Rehab consult    Precautions / Restrictions Precautions Precautions: Fall Precaution Comments: Watch BP Restrictions Weight Bearing Restrictions: No RLE Weight Bearing: Weight bearing as tolerated LLE Weight Bearing: Weight bearing as tolerated       Mobility Bed Mobility Overal bed mobility: Needs Assistance Bed Mobility: Supine to Sit     Supine to sit: Mod assist;HOB elevated     General bed mobility comments: ModA to assist LLE to EOB, frequent cues for technique/sequencing; heavy reliance on bed rail  Transfers Overall transfer level: Needs assistance Equipment used: Rolling walker (2 wheeled) Transfers: Sit to/from Stand Sit to Stand: Min assist         General transfer comment: Assist needed for cues and sequencing. increased time to manage pain and adjust to change in position    Balance Overall balance assessment: Needs assistance Sitting-balance support: No upper extremity supported;Feet supported Sitting balance-Leahy Scale:  Fair       Standing balance-Leahy Scale: Poor Standing balance comment: Reliant on UE support                           ADL either performed or assessed with clinical judgement   ADL Overall ADL's : Needs assistance/impaired                         Toilet Transfer: Min Insurance claims handler Details (indicate cue type and reason): simulated toilet trasnfers- cues for safety         Functional mobility during ADLs: Minimal assistance;Rolling walker General ADL Comments: Pt presenting with decreased strength and balance. No complaints of dizziness today, but limited by pain this session     Vision       Perception     Praxis      Cognition                                                Exercises     Shoulder Instructions       General Comments      Pertinent Vitals/ Pain       Pain Assessment: 0-10 Pain Score: 3  Faces Pain Scale: Hurts a little bit Pain Location: LLE Pain Descriptors / Indicators: Discomfort;Grimacing Pain Intervention(s): Limited activity within patient's tolerance;Monitored during session;Ice applied;Repositioned  Home Living  Prior Functioning/Environment              Frequency  Min 2X/week        Progress Toward Goals  OT Goals(current goals can now be found in the care plan section)  Progress towards OT goals: Progressing toward goals  Acute Rehab OT Goals Time For Goal Achievement: 11/03/18 Potential to Achieve Goals: Good  Plan Discharge plan needs to be updated    Co-evaluation                 AM-PAC OT "6 Clicks" Daily Activity     Outcome Measure   Help from another person eating meals?: None Help from another person taking care of personal grooming?: None Help from another person toileting, which includes using toliet, bedpan, or urinal?: A Little Help from another person bathing  (including washing, rinsing, drying)?: A Little Help from another person to put on and taking off regular upper body clothing?: None Help from another person to put on and taking off regular lower body clothing?: A Lot 6 Click Score: 20    End of Session Equipment Utilized During Treatment: Rolling walker;Gait belt  OT Visit Diagnosis: Unsteadiness on feet (R26.81);Other abnormalities of gait and mobility (R26.89);Muscle weakness (generalized) (M62.81);Pain   Activity Tolerance Patient tolerated treatment well;Patient limited by pain   Patient Left in chair;with call bell/phone within reach   Nurse Communication Mobility status;Other (comment)(needed pure wick replaced)        Time: 4825-0037 OT Time Calculation (min): 22 min  Charges: OT General Charges $OT Visit: 1 Visit OT Treatments $Self Care/Home Management : 8-22 mins     Ihor Gully, COTA/L 10/21/2018, 9:20 AM

## 2018-10-21 NOTE — Progress Notes (Signed)
Orthopedic Trauma Service Progress Note  Patient ID: [name] [name] MRN: [id number] DOB/AGE: [birth year] 85 y.o.  Subjective:  Having a rough time today  Pain tolerable but feels tired Some lightheadedness when working with PT, some hypotension as well  No CP or SOB  Lives alone    ROS As above  Objective:   VITALS:   Vitals:   10/21/18 0348 10/21/18 0421 10/21/18 0824 10/21/18 1036  BP: (!) 108/45  (!) 118/49 (!) 118/49  Pulse: 66  72 72  Resp: 15  19   Temp: 98.4 F (36.9 C)  98 F (36.7 C)   TempSrc: Oral  Oral   SpO2: (!) 86% 96% (!) 89%   Weight:      Height:        Estimated body mass index is 20.3 kg/m as calculated from the following:   Height as of this encounter: 5\' 5"  (1.651 m).   Weight as of this encounter: 55.3 kg.   Intake/Output      08/17 0701 - 08/18 0700 08/18 0701 - 08/19 0700   P.O. 720 240   I.V. (mL/kg)     Total Intake(mL/kg) 720 (13) 240 (4.3)   Urine (mL/kg/hr) 1200 (0.9)    Blood     Total Output 1200    Net -480 +240          LABS  Results for orders placed or performed during the hospital encounter of 10/19/18 (from the past 24 hour(s))  CBC     Status: Abnormal   Collection Time: 10/21/18  3:14 AM  Result Value Ref Range   WBC 9.3 4.0 - 10.5 K/uL   RBC 2.51 (L) 3.87 - 5.11 MIL/uL   Hemoglobin 7.9 (L) 12.0 - 15.0 g/dL   HCT 24.5 (L) 36.0 - 46.0 %   MCV 97.6 80.0 - 100.0 fL   MCH 31.5 26.0 - 34.0 pg   MCHC 32.2 30.0 - 36.0 g/dL   RDW 15.8 (H) 11.5 - 15.5 %   Platelets 142 (L) 150 - 400 K/uL   nRBC 0.0 0.0 - 0.2 %  Basic metabolic panel     Status: Abnormal   Collection Time: 10/21/18  3:14 AM  Result Value Ref Range   Sodium 131 (L) 135 - 145 mmol/L   Potassium 4.6 3.5 - 5.1 mmol/L   Chloride 97 (L) 98 - 111 mmol/L   CO2 26 22 - 32 mmol/L   Glucose, Bld 119 (H) 70 - 99 mg/dL   BUN 35 (H) 8 - 23 mg/dL   Creatinine, Ser 1.95 (H) 0.44 - 1.00  mg/dL   Calcium 8.5 (L) 8.9 - 10.3 mg/dL   GFR calc non Af Amer 23 (L) >60 mL/min   GFR calc Af Amer 26 (L) >60 mL/min   Anion gap 8 5 - 15    Results for [name], [name] (MRN [id number]) as of 10/21/2018 15:21  Ref. Range 10/20/2018 03:21  Vitamin D, 25-Hydroxy Latest Ref Range: 30.0 - 100.0 ng/mL 30.9   PHYSICAL EXAM:   Gen: just transferred back to bed from toilet, appears tired Lungs: Unlabored Cardiac: Regular rate and rhythm Abd:+ Bowel sounds, nontender Ext:       Left lower extremity             Dressings are clean,  dry and intact             Ext warm              + DP pulse             No DCT              Compartments are soft             motor and sensory functions intact     Assessment/Plan: 2 Days Post-Op   Principal Problem:   Hip fracture (HCC) Active Problems:   HLD (hyperlipidemia)   Essential hypertension   ATRIAL FIBRILLATION   CKD (chronic kidney disease)   S/P TAVR (transcatheter aortic valve replacement)   Hypothyroidism   Anti-infectives (From admission, onward)   Start     Dose/Rate Route Frequency Ordered Stop   10/19/18 2130  clindamycin (CLEOCIN) IVPB 600 mg     600 mg 100 mL/hr over 30 Minutes Intravenous Every 6 hours 10/19/18 2122 10/20/18 0612   10/19/18 1721  vancomycin (VANCOCIN) 1-5 GM/200ML-% IVPB    Note to Pharmacy: [name] [name]   : cabinet override      10/19/18 1721 10/20/18 0529    .  POD/HD#: 88  85 year old female s/p with left hip fracture on chronic Eliquis   -Left hip fracture s/p intramedullary nail             Weight-bear as tolerated             Range of motion as tolerated             Dressing changes as needed             Ice as needed for swelling and pain control             TED hose for swelling if patient would like             PT and OT               Patient is independent ambulator prior to this admission   Pt does live alone   Think she would benefit from CIR. She is very active a baseline and think  she would be an excellent candidate for CIR   - Pain management:             Scheduled Tylenol             Low-dose Norco if needed   - ABL anemia/Hemodynamics             pt symptomatic    Hx of CAD, lightheadedness and hypotension with PT as well as a slight bump up in her Creatinine    I do think CBC will trend down for another day  Will transfuse 1 unit PRBCs today  CBC in am      - Medical issues              Per medical service                         Eliquis resumed   - DVT/PE prophylaxis:             eliquis as above    - ID:              Perioperative antibiotics completed    - Metabolic Bone Disease:             Vitamin D  levels are ok    Recommend vitamin d supplementation    Vitamin d levels >40 have been shown to improve MSK function and decrease falls              Recommend outpatient DEXA scan in the next 4 to 8 weeks             Mechanism suggestive of osteoporosis   Pt would likely benefit from pharmacologic treatment of her osteoporosis.  We can determine which agent to use based off DEXA and additional lab work    Would recommend either an anabolic agent such as forteo, tymlos or evenity.  Could consider prolia    Would not use Bisphosphonates until fracture union has been achieved    - Activity:             Up with assistance             Weight-bear as tolerated with walker   - FEN/GI prophylaxis/Foley/Lines:             Regular diet     - Impediments to fracture healing:             Osteoporosis             Chronic medical comorbidities including thyroid dysfunction, CKD             Lasix use   - Dispo:             Continue with therapies             Ortho issues stable            awaiting CIR approval       Jari Pigg, PA-C [phone] (C) 10/21/2018, 2:18 PM  [facility] [place] [address] [phone] Domingo Sep (F)

## 2018-10-21 NOTE — Progress Notes (Signed)
PROGRESS NOTE   [name] [name]  GEX:[id number]    DOB: 03-25-1929    DOA: 10/19/2018  PCP: [name], MD   I have briefly reviewed patients previous medical records in [facility].  Chief Complaint  Patient presents with   Fall    Brief Narrative:  85 year old female patient, lives alone independently, PMH of CAD s/p DES to LAD 12/20/2016, PAF on Eliquis, PPM, s/p TAVR 03/07/7251, chronic diastolic CHF, HTN, HLD, hypothyroid, GERD and CKD 3 admitted following mechanical fall leading to left hip fracture, s/p IM nailing/ORIF by orthopedics 8/16.  Mild orthostatic symptoms 8/17, better.  Possible DC to CIR pending further clinical improvement.   Assessment & Plan:   Principal Problem:   Hip fracture ([place]) Active Problems:   HLD (hyperlipidemia)   Essential hypertension   ATRIAL FIBRILLATION   CKD (chronic kidney disease)   S/P TAVR (transcatheter aortic valve replacement)   Hypothyroidism   Left intertrochanteric femur/hip fracture, s/p ORIF (IM nailing) 10/19/2018  Sustained s/p mechanical fall  Orthopedics consulted and s/p IM nailing 8/16  WBAT, continue prior home dose of Eliquis which covers DVT prophylaxis  Outpatient orthopedic follow-up in 2 weeks for suture removal and further evaluation.  Therapies had initially recommended home health PT and OT.  However as per OT follow-up today, since patient has no family support at home, was independent PTA, recommend CIR for DC to maximize functional independence prior to discharge home.  CIR consultation placed.  Osteoporosis likely contributing.  Follow vitamin D levels and recommend outpatient bone density evaluation within the next 4 weeks or initiation of a bisphosphonate.  Orthostatic hypotension  Noted on 8/17 morning.  BP dropped from 124/78 sitting >96/45 standing.  Could have been due to meds (antihypertensives, opioids) and ABLA  Held all antihypertensives yesterday.  Improved but blood pressures  still on the soft side.  Continue to hold amlodipine and losartan.  Resumed metoprolol at reduced dose 12.5 twice daily due to history of A. fib to avoid RVR.  She did get a dose of 37.5 mg this morning.  Postop acute blood loss anemia  Hemoglobin dropped from 12 preop to 9.8 >7.9 today.  Follow CBC daily and transfuse if hemoglobin 7 g or less.  No overt bleeding noted.  Thrombocytopenia  Mild.  Likely due to ABLA.  Follow CBC in a.m.  Paroxysmal A. fib/PPM  Rate controlled.  Continue amiodarone and metoprolol at reduced dose.  Eliquis held preoperatively and resumed 2.5 mg twice daily on POD #1 on 8/17  Essential hypertension  Reduced dose of metoprolol 12.5 mg twice daily for HTN and A. fib.  Controlled/soft.  Hyperlipidemia  Continue Zetia.  Acute kidney injury complicating stage III CKD  Creatinine went up from 1.1 yesterday to 1.95, likely due to hemodynamics/hypotension from reasons indicated above.  Hold losartan.  Maintain BPs on slightly higher side.  Brief and gentle IV fluids.  Follow BMP in a.m.  Intake and output chart.  Hypothyroid  Continue Synthroid.  CAD/s/p TAVR/chronic diastolic CHF  Stable without angina and clinically euvolemic.  Continue to hold home dose of Lasix.  Hypoxia  Mild.  Possibly from atelectasis.  No clinical CHF.  Continue incentive spirometry and monitor closely.  Oxygen supplementation as needed.  DVT prophylaxis: Resumed Eliquis POD #1, 8/17 Code Status: DNR Family Communication: I discussed in detail with patient's sister via phone, updated care and answered questions. Disposition: DC pending further clinical improvement, which may take another 1 to 2 days and  pending evaluation for CIR admission.   Consultants:  Orthopedics Rehab MD, pending.  Procedures:  Left hip IM nailing on 8/16  Antimicrobials:  Perioperative antibiotics   Subjective: Patient feels much better today.  Occasional mild dizziness when  she got up out of bed.  No feeling like passing out, chest pain or dyspnea.  No nausea or vomiting.  Mild left hip postop pain.  Objective:  Vitals:   10/21/18 0348 10/21/18 0421 10/21/18 0824 10/21/18 1036  BP: (!) 108/45  (!) 118/49 (!) 118/49  Pulse: 66  72 72  Resp: 15  19   Temp: 98.4 F (36.9 C)  98 F (36.7 C)   TempSrc: Oral  Oral   SpO2: (!) 86% 96% (!) 89%   Weight:      Height:        Examination:  General exam: Pleasant elderly female, moderately built and nourished sitting up comfortably on reclining chair without distress.  Oral mucosa moist. Respiratory system: Clear to auscultation.  No increased work of breathing. Cardiovascular system: S1 and S2 heard, RRR.  No JVD or pedal edema.  2/6 systolic ejection murmur. Gastrointestinal system: Abdomen is nondistended, soft and nontender. No organomegaly or masses felt. Normal bowel sounds heard. Central nervous system: Alert and oriented. No focal neurological deficits. Extremities: Symmetric 5 x 5 power.  Left hip postop dressing clean and dry. Skin: No rashes, lesions or ulcers Psychiatry: Judgement and insight appear normal. Mood & affect appropriate.     Data Reviewed: I have personally reviewed following labs and imaging studies  CBC: Recent Labs  Lab 10/19/18 1208 10/20/18 0321 10/21/18 0314  WBC 8.0 9.4 9.3  NEUTROABS 5.1  --   --   HGB 12.0 9.8* 7.9*  HCT 38.1 31.1* 24.5*  MCV 98.2 98.1 97.6  PLT 178 160 161*   Basic Metabolic Panel: Recent Labs  Lab 10/19/18 1208 10/20/18 0321 10/21/18 0314  NA 136 136 131*  K 4.4 5.1 4.6  CL 102 102 97*  CO2 23 27 26   GLUCOSE 111* 170* 119*  BUN 28* 20 35*  CREATININE 1.11* 1.12* 1.95*  CALCIUM 9.3 8.5* 8.5*   Liver Function Tests: No results for input(s): AST, ALT, ALKPHOS, BILITOT, PROT, ALBUMIN in the last 168 hours.  Cardiac Enzymes: No results for input(s): CKTOTAL, CKMB, CKMBINDEX, TROPONINI in the last 168 hours.  CBG: No results for  input(s): GLUCAP in the last 168 hours.  Recent Results (from the past 240 hour(s))  SARS Coronavirus 2 [facility] order, Performed in [facility] hospital lab) Nasopharyngeal Nasopharyngeal Swab     Status: None   Collection Time: 10/19/18 12:09 PM   Specimen: Nasopharyngeal Swab  Result Value Ref Range Status   SARS Coronavirus 2 NEGATIVE NEGATIVE Final    Comment: (NOTE) If result is NEGATIVE SARS-CoV-2 target nucleic acids are NOT DETECTED. The SARS-CoV-2 RNA is generally detectable in upper and lower  respiratory specimens during the acute phase of infection. The lowest  concentration of SARS-CoV-2 viral copies this assay can detect is 250  copies / mL. A negative result does not preclude SARS-CoV-2 infection  and should not be used as the sole basis for treatment or other  patient management decisions.  A negative result may occur with  improper specimen collection / handling, submission of specimen other  than nasopharyngeal swab, presence of viral mutation(s) within the  areas targeted by this assay, and inadequate number of viral copies  (<250 copies / mL). A negative result must  be combined with clinical  observations, patient history, and epidemiological information. If result is POSITIVE SARS-CoV-2 target nucleic acids are DETECTED. The SARS-CoV-2 RNA is generally detectable in upper and lower  respiratory specimens dur ing the acute phase of infection.  Positive  results are indicative of active infection with SARS-CoV-2.  Clinical  correlation with patient history and other diagnostic information is  necessary to determine patient infection status.  Positive results do  not rule out bacterial infection or co-infection with other viruses. If result is PRESUMPTIVE POSTIVE SARS-CoV-2 nucleic acids MAY BE PRESENT.   A presumptive positive result was obtained on the submitted specimen  and confirmed on repeat testing.  While 2019 novel coronavirus  (SARS-CoV-2) nucleic  acids may be present in the submitted sample  additional confirmatory testing may be necessary for epidemiological  and / or clinical management purposes  to differentiate between  SARS-CoV-2 and other Sarbecovirus currently known to infect humans.  If clinically indicated additional testing with an alternate test  methodology [phone]) is advised. The SARS-CoV-2 RNA is generally  detectable in upper and lower respiratory sp ecimens during the acute  phase of infection. The expected result is Negative. Fact Sheet for Patients:  StrictlyIdeas.no Fact Sheet for Healthcare Providers: BankingDealers.co.za This test is not yet approved or cleared by the [place] FDA and has been authorized for detection and/or diagnosis of SARS-CoV-2 by FDA under an Emergency Use Authorization (EUA).  This EUA will remain in effect (meaning this test can be used) for the duration of the COVID-19 declaration under Section 564(b)(1) of the Act, 21 U.S.C. section 360bbb-3(b)(1), unless the authorization is terminated or revoked sooner. Performed at [facility], [place] [address] [place], [place] [address]          Radiology Studies: Dg Chest 1 View  Result Date: 10/19/2018 CLINICAL DATA:  Fall EXAM: CHEST  1 VIEW COMPARISON:  01/29/2017 FINDINGS: Chronic cardiomegaly. Pacer leads from both sides with sacrifice on the right. Transcatheter aortic valve replacement. Chronic interstitial coarsening mild chronic lung disease/scarring on 2018 chest CT. Bronchial calcification likely contributes to the appearance. There is no edema, consolidation, effusion, or pneumothorax. No appreciable fracture. IMPRESSION: No acute finding. Electronically Signed   By: Monte Fantasia M.D.   On: 10/19/2018 14:04   Pelvis Portable  Result Date: 10/19/2018 CLINICAL DATA:  Femoral head fracture EXAM: PORTABLE PELVIS 1-2 VIEWS COMPARISON:  June 19, 2018 FINDINGS: The  patient has undergone placement of an intramedullary nail through the left femur. The alignment appears stable. The hardware appears intact. There are expected postsurgical changes including subcutaneous gas and overlying soft tissue edema. There are degenerative changes of the right hip. IMPRESSION: Expected postsurgical changes related to left femur ORIF. Electronically Signed   By: Constance Holster M.D.   On: 10/19/2018 22:31   Dg C-arm 1-60 Min  Result Date: 10/19/2018 CLINICAL DATA:  ORIF of left femur fracture. EXAM: DG C-ARM 61-120 MIN COMPARISON:  Left hip radiographs 10/19/2018 FLUOROSCOPY TIME:  Fluoroscopy Time:  52 seconds FINDINGS: Seven intraoperative fluoroscopic images of the left femur are provided and demonstrate ORIF of the previously described intertrochanteric femur fracture. An intramedullary nail and proximal and distal screws have been placed, and alignment is improved. IMPRESSION: Intraoperative images during ORIF of left femur fracture. Electronically Signed   By: Logan Bores M.D.   On: 10/19/2018 20:59   Dg Hip Unilat W Or Wo Pelvis 2-3 Views Left  Result Date: 10/19/2018 CLINICAL DATA:  Fall with  left hip pain EXAM: DG HIP (WITH OR WITHOUT PELVIS) 2-3V LEFT COMPARISON:  None. FINDINGS: Acute intertrochanteric left femur fracture with proximal retraction of the lesser trochanter. On the lateral view there is dorsal impaction. No hip dislocation. No evidence of pelvic ring fracture. IMPRESSION: Intertrochanteric left femur fracture. Electronically Signed   By: Monte Fantasia M.D.   On: 10/19/2018 14:02   Dg Femur Min 2 Views Left  Result Date: 10/19/2018 CLINICAL DATA:  Postop left femur fracture, ORIF intertrochanteric fracture. EXAM: LEFT FEMUR 2 VIEWS COMPARISON:  Preoperative radiographs earlier this day. FINDINGS: Intramedullary nail with trans trochanteric screw fixation of comminuted intertrochanteric femur fracture. Fractures in improved alignment compared to  preoperative imaging. No periprosthetic lucency. Recent postsurgical change includes air and edema in the soft tissues. IMPRESSION: Post ORIF of intertrochanteric femur fracture without immediate postoperative complication. Electronically Signed   By: Keith Rake M.D.   On: 10/19/2018 20:51   Dg Femur Port Min 2 Views Left  Result Date: 10/19/2018 CLINICAL DATA:  ORIF of the left femur EXAM: LEFT FEMUR PORTABLE 2 VIEWS COMPARISON:  October 19, 2018 FINDINGS: There are expected postsurgical changes related to ORIF and placement of an IM nail through the left femur. There is subcutaneous gas and overlying soft tissue edema. Again noted is a fracture of the proximal left femur with stable osseous alignment. The hardware is intact. IMPRESSION: Status post ORIF of the left femur as detailed above. Electronically Signed   By: Constance Holster M.D.   On: 10/19/2018 22:35        Scheduled Meds:  acetaminophen  650 mg Oral Q6H   amiodarone  100 mg Oral Daily   apixaban  2.5 mg Oral BID   calcium carbonate  625 mg Oral BID WC   docusate sodium  100 mg Oral BID   ezetimibe  10 mg Oral Daily   feeding supplement (ENSURE ENLIVE)  237 mL Oral BID BM   levothyroxine  125 mcg Oral QAC breakfast   metoprolol tartrate  37.5 mg Oral BID   pantoprazole  40 mg Oral Daily   Continuous Infusions:  methocarbamol (ROBAXIN) IV 500 mg (10/20/18 2136)     LOS: 2 days     Vernell Leep, MD, FACP, [facility]. [facility]  To contact the attending provider between 7A-7P or the covering provider during after hours 7P-7A, please log into the web site [email or web address] and access using universal [place] password for that web site. If you do not have the password, please call the hospital operator.  10/21/2018, 11:12 AM

## 2018-10-21 NOTE — Progress Notes (Signed)
Inpatient Rehabilitation Admissions Coordinator  Inpatient rehab consult received. I met with patient and her sister, Tye [place] at bedside. We discussed goals and expectations of an inpt rehab admit. They prefer an inpt rehab admit rather than SNF. I will begin insurance approval with [facility] for a possible inpt rehab pending insurance approval.  Danne Baxter, RN, MSN Rehab Admissions Coordinator [phone] 10/21/2018 2:57 PM

## 2018-10-21 NOTE — Progress Notes (Signed)
Physical Therapy Treatment Patient Details Name: [name] [name] MRN: [id number] DOB: [birth year] Today's Date: 10/21/2018    History of Present Illness Pt is an 85 y.o. female admitted 10/19/18 after fall when stepping off curb sustaining L intertrochanteric hip fx; s/p IM nailing 8/16. PMH includes pacemaker, CAD, HTN, CHF, CKD 3.   PT Comments    Pt progressing with mobility. Able to continue transfer and gait training with RW and minA; pt limited by generalized weakness, pain, fatigue and poor postural reactions/balance strategies; at high risk for falls. Pt motivated to participate and regain independent PLOF; sister present throughout session and support. Pt agrees she requires post-acute rehab prior to return home. Recommend intensive CIR-level therapies to maximize functional mobility and independence.  Orthostatic BPs Sitting/resting 105/48  Standing 88/46  Standing after 2 min 93/46  Post-ambulation 122/53      Follow Up Recommendations  CIR;Supervision/Assistance - 24 hour     Equipment Recommendations  (TBD)    Recommendations for Other Services       Precautions / Restrictions Precautions Precautions: Fall Precaution Comments: Watch BP Restrictions Weight Bearing Restrictions: Yes RLE Weight Bearing: Weight bearing as tolerated LLE Weight Bearing: Weight bearing as tolerated    Mobility  Bed Mobility Overal bed mobility: Needs Assistance Bed Mobility: Sit to Supine     Supine to sit: Mod assist;HOB elevated Sit to supine: Min assist   General bed mobility comments: MinA to manage LLE and reposition in bed  Transfers Overall transfer level: Needs assistance Equipment used: Rolling walker (2 wheeled) Transfers: Sit to/from Stand Sit to Stand: Min assist         General transfer comment: Pt able to perform multiple sit<>stands from bed, recliner, and BSC (over toilet) to RW with minA; heavy reliance on BUE support. Poor carryover, requiring cues each  trial for correct technique  Ambulation/Gait Ambulation/Gait assistance: Min assist Gait Distance (Feet): 20 Feet Assistive device: Rolling walker (2 wheeled) Gait Pattern/deviations: Step-through pattern;Decreased stride length;Decreased weight shift to left;Antalgic;Trunk flexed Gait velocity: Decreased Gait velocity interpretation: <1.31 ft/sec, indicative of household ambulator General Gait Details: Slow, antalgic gait to/from bathroom (seated rest break on toilet) with RW and minA for balance; pt limited by weakness, fatigue, pain and decreased balance   Stairs             Wheelchair Mobility    Modified Rankin (Stroke Patients Only)       Balance Overall balance assessment: Needs assistance Sitting-balance support: No upper extremity supported;Feet supported Sitting balance-Leahy Scale: Fair       Standing balance-Leahy Scale: Poor Standing balance comment: Reliant on UE support                            Cognition Arousal/Alertness: Awake/alert Behavior During Therapy: WFL for tasks assessed/performed Overall Cognitive Status: Within Functional Limits for tasks assessed                                        Exercises General Exercises - Lower Extremity Ankle Circles/Pumps: AROM;Both;Supine Long Arc Quad: AROM;Both;Seated Heel Slides: AROM;Left;Supine Hip ABduction/ADduction: AROM;Left;Supine    General Comments        Pertinent Vitals/Pain Pain Assessment: Faces Pain Score: 3  Faces Pain Scale: Hurts little more Pain Location: LLE Pain Descriptors / Indicators: Discomfort;Grimacing;Guarding Pain Intervention(s): Monitored during session;Limited activity within patient's tolerance  Home Living                      Prior Function            PT Goals (current goals can now be found in the care plan section) Acute Rehab PT Goals Patient Stated Goal: Post-acute rehab before return home PT Goal  Formulation: With patient Time For Goal Achievement: 11/03/18 Potential to Achieve Goals: Good Progress towards PT goals: Progressing toward goals    Frequency    Min 5X/week      PT Plan Discharge plan needs to be updated    Co-evaluation              AM-PAC PT "6 Clicks" Mobility   Outcome Measure  Help needed turning from your back to your side while in a flat bed without using bedrails?: A Little Help needed moving from lying on your back to sitting on the side of a flat bed without using bedrails?: A Little Help needed moving to and from a bed to a chair (including a wheelchair)?: A Little Help needed standing up from a chair using your arms (e.g., wheelchair or bedside chair)?: A Little Help needed to walk in hospital room?: A Little Help needed climbing 3-5 steps with a railing? : A Lot 6 Click Score: 17    End of Session Equipment Utilized During Treatment: Gait belt Activity Tolerance: Patient tolerated treatment well Patient left: in bed;with call bell/phone within reach;with bed alarm set;with family/visitor present Nurse Communication: Mobility status PT Visit Diagnosis: Other abnormalities of gait and mobility (R26.89);Pain Pain - Right/Left: Left Pain - part of body: Leg     Time: 1610-9604 PT Time Calculation (min) (ACUTE ONLY): 40 min  Charges:  $Gait Training: 8-22 mins $Therapeutic Exercise: 8-22 mins $Therapeutic Activity: 8-22 mins                    Mabeline Caras, PT, DPT Acute Rehabilitation Services  Pager [phone] Office [place] 10/21/2018, 12:03 PM

## 2018-10-22 LAB — CBC
HCT: 25.3 % — ABNORMAL LOW (ref 36.0–46.0)
Hemoglobin: 8.2 g/dL — ABNORMAL LOW (ref 12.0–15.0)
MCH: 30.6 pg (ref 26.0–34.0)
MCHC: 32.4 g/dL (ref 30.0–36.0)
MCV: 94.4 fL (ref 80.0–100.0)
Platelets: 129 10*3/uL — ABNORMAL LOW (ref 150–400)
RBC: 2.68 MIL/uL — ABNORMAL LOW (ref 3.87–5.11)
RDW: 18.5 % — ABNORMAL HIGH (ref 11.5–15.5)
WBC: 8.4 10*3/uL (ref 4.0–10.5)
nRBC: 0 % (ref 0.0–0.2)

## 2018-10-22 LAB — BPAM RBC
Blood Product Expiration Date: 202009012359
ISSUE DATE / TIME: 202008181500
Unit Type and Rh: 6200

## 2018-10-22 LAB — BASIC METABOLIC PANEL
Anion gap: 6 (ref 5–15)
BUN: 27 mg/dL — ABNORMAL HIGH (ref 8–23)
CO2: 26 mmol/L (ref 22–32)
Calcium: 8.2 mg/dL — ABNORMAL LOW (ref 8.9–10.3)
Chloride: 102 mmol/L (ref 98–111)
Creatinine, Ser: 1.31 mg/dL — ABNORMAL HIGH (ref 0.44–1.00)
GFR calc Af Amer: 42 mL/min — ABNORMAL LOW (ref >60)
GFR calc non Af Amer: 37 mL/min — ABNORMAL LOW (ref >60)
Glucose, Bld: 121 mg/dL — ABNORMAL HIGH (ref 70–99)
Potassium: 4.4 mmol/L (ref 3.5–5.1)
Sodium: 134 mmol/L — ABNORMAL LOW (ref 135–145)

## 2018-10-22 LAB — TYPE AND SCREEN
ABO/RH(D): A POS
Antibody Screen: NEGATIVE
Unit division: 0

## 2018-10-22 NOTE — Progress Notes (Signed)
Physical Therapy Treatment Patient Details Name: [name] [name] MRN: [id number] DOB: 1929/05/20 Today's Date: 10/22/2018    History of Present Illness Pt is an 85 y.o. female admitted 10/19/18 after fall when stepping off curb sustaining L intertrochanteric hip fx; s/p IM nailing 8/16. PMH includes pacemaker, CAD, HTN, CHF, CKD 3.   PT Comments    Pt progressing well with mobility. Today's session focused on continued strengthening, transfer and gait training with RW; pt requires minA for balance, unable to accept challenge. BP stable with mobility this session (see values below), and pt asymptomatic. Despite pain, pt remains highly motivated to participate and regain PLOF; hopeful for d/c to CIR.   Sitting BP 129/54 Standing BP 131/58 Post-ambulation BP 127/51   Follow Up Recommendations  CIR;Supervision/Assistance - 24 hour     Equipment Recommendations  (TBD next venue)    Recommendations for Other Services       Precautions / Restrictions Precautions Precautions: Fall Restrictions Weight Bearing Restrictions: Yes LLE Weight Bearing: Weight bearing as tolerated    Mobility  Bed Mobility Overal bed mobility: Needs Assistance Bed Mobility: Supine to Sit     Supine to sit: Min assist;HOB elevated     General bed mobility comments: Reliant on use of bed rail; improved ability to manage LLE with UE support  Transfers Overall transfer level: Needs assistance Equipment used: Rolling walker (2 wheeled) Transfers: Sit to/from Stand Sit to Stand: Min assist         General transfer comment: Pt able to perform multiple sit<>stands from bed, recliner, and BSC to RW with minA; heavy reliance on BUE support. Improved carryover of instruction, requiring intermittent cues for correct technique  Ambulation/Gait Ambulation/Gait assistance: Min assist Gait Distance (Feet): 20 Feet(+20) Assistive device: Rolling walker (2 wheeled) Gait Pattern/deviations: Step-through  pattern;Decreased stride length;Decreased weight shift to left;Antalgic;Trunk flexed;Decreased step length - right Gait velocity: Decreased Gait velocity interpretation: <1.31 ft/sec, indicative of household ambulator General Gait Details: Slow, antalgic gait with RW and intermittent minA for balance; cues to increase step length with RLE. Prolonged seated rest break due to fatigue. Pt asypmtomatic; VSS   Stairs             Wheelchair Mobility    Modified Rankin (Stroke Patients Only)       Balance Overall balance assessment: Needs assistance Sitting-balance support: No upper extremity supported;Feet supported Sitting balance-Leahy Scale: Fair       Standing balance-Leahy Scale: Poor Standing balance comment: Reliant on UE support                            Cognition Arousal/Alertness: Awake/alert Behavior During Therapy: WFL for tasks assessed/performed Overall Cognitive Status: Within Functional Limits for tasks assessed                                        Exercises General Exercises - Lower Extremity Long Arc Quad: AROM;Left;Seated Heel Slides: AROM;Left;Supine    General Comments        Pertinent Vitals/Pain Pain Assessment: Faces Faces Pain Scale: Hurts little more Pain Location: LLE Pain Descriptors / Indicators: Discomfort;Grimacing;Guarding Pain Intervention(s): Monitored during session;Ice applied    Home Living                      Prior Function  PT Goals (current goals can now be found in the care plan section) Acute Rehab PT Goals Patient Stated Goal: Post-acute rehab at [facility] before return home PT Goal Formulation: With patient Time For Goal Achievement: 11/03/18 Potential to Achieve Goals: Good Progress towards PT goals: Progressing toward goals    Frequency    Min 1X/week      PT Plan Current plan remains appropriate    Co-evaluation              AM-PAC PT "6 Clicks"  Mobility   Outcome Measure  Help needed turning from your back to your side while in a flat bed without using bedrails?: A Little Help needed moving from lying on your back to sitting on the side of a flat bed without using bedrails?: A Little Help needed moving to and from a bed to a chair (including a wheelchair)?: A Little Help needed standing up from a chair using your arms (e.g., wheelchair or bedside chair)?: A Little Help needed to walk in hospital room?: A Little Help needed climbing 3-5 steps with a railing? : A Lot 6 Click Score: 17    End of Session Equipment Utilized During Treatment: Gait belt Activity Tolerance: Patient tolerated treatment well Patient left: in chair;with call bell/phone within reach Nurse Communication: Mobility status PT Visit Diagnosis: Other abnormalities of gait and mobility (R26.89);Pain Pain - Right/Left: Left Pain - part of body: Leg     Time: 6967-8938 PT Time Calculation (min) (ACUTE ONLY): 30 min  Charges:  $Gait Training: 8-22 mins $Therapeutic Activity: 8-22 mins                    Mabeline Caras, PT, DPT Acute Rehabilitation Services  Pager [phone] Office [place] 10/22/2018, 5:50 PM

## 2018-10-22 NOTE — Progress Notes (Signed)
PROGRESS NOTE   [name] [name]  ZOX:[id number]    DOB: 08-31-1929    DOA: 10/19/2018  PCP: [name], MD    Brief Narrative:  85 year old female patient, lives alone independently, PMH of CAD s/p DES to LAD 12/20/2016, PAF on Eliquis, PPM, s/p TAVR 81/19/1478, chronic diastolic CHF, HTN, HLD, hypothyroid, GERD and CKD 3 admitted following mechanical fall leading to left hip fracture, s/p IM nailing/ORIF by orthopedics 8/16.  Mild orthostatic symptoms 8/17, better.  Possible DC to CIR pending further clinical improvement.   Assessment & Plan:   Principal Problem:   Hip fracture ([place]) Active Problems:   HLD (hyperlipidemia)   Essential hypertension   ATRIAL FIBRILLATION   CKD (chronic kidney disease)   S/P TAVR (transcatheter aortic valve replacement)   Hypothyroidism   Left intertrochanteric femur/hip fracture, s/p ORIF (IM nailing) 10/19/2018  Sustained s/p mechanical fall  Orthopedics consulted and s/p IM nailing 8/16  WBAT, continue prior home dose of Eliquis which covers DVT prophylaxis  Outpatient orthopedic follow-up in 2 weeks for suture removal and further evaluation.  Therapies had initially recommended home health PT and OT.  However as per OT follow-up, since patient has no family support at home, was independent PTA, recommend CIR for DC to maximize functional independence prior to discharge home.  CIR consultation placed.  Osteoporosis likely contributing.  Follow vitamin D levels and recommend outpatient bone density evaluation within the next 4 weeks or initiation of a bisphosphonate.  Orthostatic hypotension  Noted on 8/17 morning.  BP dropped from 124/78 sitting >96/45 standing.  Could have been due to meds (antihypertensives, opioids) and ABLA  Antihypertensives were held.  Metoprolol was reintroduced.  Continue to hold amlodipine and losartan.  Blood pressure is stable.  Postop acute blood loss anemia  Hemoglobin did drop but stable this morning.   Transfuse if it falls below 7  Thrombocytopenia  Mild.  Continue to monitor periodically.  Paroxysmal A. fib/PPM  Rate controlled.  Continue amiodarone and metoprolol at reduced dose.  Eliquis held preoperatively and resumed 2.5 mg twice daily on POD #1 on 8/17  Essential hypertension  Reduced dose of metoprolol 12.5 mg twice daily for HTN and A. fib.  Controlled/soft.  Hyperlipidemia  Continue Zetia.  Acute kidney injury complicating stage III CKD  Creatinine went up from 1.1 to 1.95, likely due to hemodynamics/hypotension from reasons indicated above.  Losartan was placed on hold.  Patient was given gentle IV fluids.  Creatinine improved this morning.  Monitor urine output.  Hypothyroid  Continue Synthroid.  CAD/s/p TAVR/chronic diastolic CHF  Stable without angina and clinically euvolemic.  Continue to hold home dose of Lasix.  Hypoxia  Mild.  Possibly from atelectasis.  No clinical CHF.  Continue incentive spirometry and monitor closely.  Oxygen supplementation as needed.  DVT prophylaxis: Resumed Eliquis POD #1, 8/17 Code Status: DNR Family Communication: We will call family members later today Disposition: Plan is for inpatient rehab.  Insurance authorization being pursued.     Consultants:  Orthopedics Rehab MD, pending.  Procedures:  Left hip IM nailing on 8/16  Antimicrobials:  Perioperative antibiotics   Subjective: Patient denies any complaints this morning.  No nausea vomiting.  No dizziness or lightheadedness.    Objective:  Vitals:   10/22/18 0313 10/22/18 0554 10/22/18 0835 10/22/18 1018  BP: (!) 122/47  (!) 111/50 (!) 111/50  Pulse: 65  68 68  Resp: 14  16   Temp: (!) 97.2 F (36.2 C)  98 F (  36.7 C)   TempSrc: Oral  Oral   SpO2: 97%  100%   Weight:  59.8 kg    Height:        Examination:  General appearance: Awake alert.  In no distress.  Mildly confused. Resp: Clear to auscultation bilaterally.  Normal effort  Cardio: S1-S2 is normal regular.  No S3-S4.  No rubs murmurs or bruit GI: Abdomen is soft.  Nontender nondistended.  Bowel sounds are present normal.  No masses organomegaly Extremities: No edema.   Neurologic: No focal neurological deficits.      Data Reviewed: I have personally reviewed following labs and imaging studies  CBC: Recent Labs  Lab 10/19/18 1208 10/20/18 0321 10/21/18 0314 10/22/18 0311  WBC 8.0 9.4 9.3 8.4  NEUTROABS 5.1  --   --   --   HGB 12.0 9.8* 7.9* 8.2*  HCT 38.1 31.1* 24.5* 25.3*  MCV 98.2 98.1 97.6 94.4  PLT 178 160 142* 578*   Basic Metabolic Panel: Recent Labs  Lab 10/19/18 1208 10/20/18 0321 10/21/18 0314 10/22/18 0311  NA 136 136 131* 134*  K 4.4 5.1 4.6 4.4  CL 102 102 97* 102  CO2 23 27 26 26   GLUCOSE 111* 170* 119* 121*  BUN 28* 20 35* 27*  CREATININE 1.11* 1.12* 1.95* 1.31*  CALCIUM 9.3 8.5* 8.5* 8.2*     Recent Results (from the past 240 hour(s))  SARS Coronavirus 2 [facility] order, Performed in [facility] hospital lab) Nasopharyngeal Nasopharyngeal Swab     Status: None   Collection Time: 10/19/18 12:09 PM   Specimen: Nasopharyngeal Swab  Result Value Ref Range Status   SARS Coronavirus 2 NEGATIVE NEGATIVE Final    Comment: (NOTE) If result is NEGATIVE SARS-CoV-2 target nucleic acids are NOT DETECTED. The SARS-CoV-2 RNA is generally detectable in upper and lower  respiratory specimens during the acute phase of infection. The lowest  concentration of SARS-CoV-2 viral copies this assay can detect is 250  copies / mL. A negative result does not preclude SARS-CoV-2 infection  and should not be used as the sole basis for treatment or other  patient management decisions.  A negative result may occur with  improper specimen collection / handling, submission of specimen other  than nasopharyngeal swab, presence of viral mutation(s) within the  areas targeted by this assay, and inadequate number of viral copies  (<250 copies /  mL). A negative result must be combined with clinical  observations, patient history, and epidemiological information. If result is POSITIVE SARS-CoV-2 target nucleic acids are DETECTED. The SARS-CoV-2 RNA is generally detectable in upper and lower  respiratory specimens dur ing the acute phase of infection.  Positive  results are indicative of active infection with SARS-CoV-2.  Clinical  correlation with patient history and other diagnostic information is  necessary to determine patient infection status.  Positive results do  not rule out bacterial infection or co-infection with other viruses. If result is PRESUMPTIVE POSTIVE SARS-CoV-2 nucleic acids MAY BE PRESENT.   A presumptive positive result was obtained on the submitted specimen  and confirmed on repeat testing.  While 2019 novel coronavirus  (SARS-CoV-2) nucleic acids may be present in the submitted sample  additional confirmatory testing may be necessary for epidemiological  and / or clinical management purposes  to differentiate between  SARS-CoV-2 and other Sarbecovirus currently known to infect humans.  If clinically indicated additional testing with an alternate test  methodology [id number]) is advised. The SARS-CoV-2 RNA is generally  detectable  in upper and lower respiratory sp ecimens during the acute  phase of infection. The expected result is Negative. Fact Sheet for Patients:  StrictlyIdeas.no Fact Sheet for Healthcare Providers: BankingDealers.co.za This test is not yet approved or cleared by the [place] FDA and has been authorized for detection and/or diagnosis of SARS-CoV-2 by FDA under an Emergency Use Authorization (EUA).  This EUA will remain in effect (meaning this test can be used) for the duration of the COVID-19 declaration under Section 564(b)(1) of the Act, 21 U.S.C. section 360bbb-3(b)(1), unless the authorization is terminated or revoked sooner.  Performed at [facility], [place] [address], [place], [place] [address]          Radiology Studies: No results found.      Scheduled Meds: . acetaminophen  650 mg Oral Q6H  . amiodarone  100 mg Oral Daily  . apixaban  2.5 mg Oral BID  . calcium carbonate  625 mg Oral BID WC  . cholecalciferol  2,000 Units Oral BID  . docusate sodium  100 mg Oral BID  . ezetimibe  10 mg Oral Daily  . feeding supplement (ENSURE ENLIVE)  237 mL Oral BID BM  . levothyroxine  125 mcg Oral QAC breakfast  . metoprolol tartrate  12.5 mg Oral BID  . pantoprazole  40 mg Oral Daily  . vitamin C  500 mg Oral Daily   Continuous Infusions: . methocarbamol (ROBAXIN) IV 500 mg (10/22/18 0606)     LOS: 3 days     Bonnielee Haff,  [facility]  To contact the attending provider between 7A-7P or the covering provider during after hours 7P-7A, please log into the web site [email or web address] and access using universal [place] password for that web site. If you do not have the password, please call the hospital operator.  10/22/2018, 11:54 AM

## 2018-10-23 LAB — CBC
HCT: 26 % — ABNORMAL LOW (ref 36.0–46.0)
Hemoglobin: 8.5 g/dL — ABNORMAL LOW (ref 12.0–15.0)
MCH: 31 pg (ref 26.0–34.0)
MCHC: 32.7 g/dL (ref 30.0–36.0)
MCV: 94.9 fL (ref 80.0–100.0)
Platelets: 147 10*3/uL — ABNORMAL LOW (ref 150–400)
RBC: 2.74 MIL/uL — ABNORMAL LOW (ref 3.87–5.11)
RDW: 18.2 % — ABNORMAL HIGH (ref 11.5–15.5)
WBC: 7.1 10*3/uL (ref 4.0–10.5)
nRBC: 0 % (ref 0.0–0.2)

## 2018-10-23 LAB — BASIC METABOLIC PANEL
Anion gap: 8 (ref 5–15)
BUN: 16 mg/dL (ref 8–23)
CO2: 24 mmol/L (ref 22–32)
Calcium: 8.5 mg/dL — ABNORMAL LOW (ref 8.9–10.3)
Chloride: 106 mmol/L (ref 98–111)
Creatinine, Ser: 0.96 mg/dL (ref 0.44–1.00)
GFR calc Af Amer: >60 mL/min (ref >60)
GFR calc non Af Amer: 53 mL/min — ABNORMAL LOW (ref >60)
Glucose, Bld: 106 mg/dL — ABNORMAL HIGH (ref 70–99)
Potassium: 4.8 mmol/L (ref 3.5–5.1)
Sodium: 138 mmol/L (ref 135–145)

## 2018-10-23 MED ORDER — METHOCARBAMOL 1000 MG/10ML IJ SOLN
500.0000 mg | Freq: Four times a day (QID) | INTRAVENOUS | Status: DC | PRN
  Filled 2018-10-23: qty 5

## 2018-10-23 MED ORDER — METHOCARBAMOL 500 MG PO TABS
500.0000 mg | ORAL_TABLET | Freq: Four times a day (QID) | ORAL | Status: DC | PRN
  Administered 2018-10-24 – 2018-10-28 (×6): 500 mg via ORAL
  Filled 2018-10-23 (×6): qty 1

## 2018-10-23 NOTE — TOC Initial Note (Signed)
Transition of Care [facility]) - Initial/Assessment Note    Patient Details  Name: [name] [name] MRN: [id number] Date of Birth: 1929/09/19  Transition of Care [facility]) CM/SW Contact:    Alberteen Sam,  Phone Number: [phone] 10/23/2018, 1:47 PM  Clinical Narrative:                  CSW consulted with patient and sister Juliann Pulse at bedside regarding discharge plan. Patient reports Juliann Pulse is POA and gave permission for CSW to talk freely with her present. CSW informed of insurance denial for CIR, therefore patient is agreeable to SNF for short term rehab. CSW explained insurance authorization and asked for SNF preference. Patient and sister agreeable to [facility]. CSW informed of lack of beds at [facility] currently. They report being agreeable to [facility]. CSW has sent referral pending bed offer at this time.   Expected Discharge Plan: Skilled Nursing Facility Barriers to Discharge: Continued Medical Work up   Patient Goals and CMS Choice   CMS Medicare.gov Compare Post Acute Care list provided to:: Patient Choice offered to / list presented to : Patient  Expected Discharge Plan and Services Expected Discharge Plan: [place] Choice: [place] arrangements for the past 2 months: Single Family Home                                      Prior Living Arrangements/Services Living arrangements for the past 2 months: Single Family Home Lives with:: Self Patient language and need for interpreter reviewed:: Yes Do you feel safe going back to the place where you live?: No   needs short term rehab  Need for Family Participation in Patient Care: Yes (Comment) Care giver support system in place?: Yes (comment)   Criminal Activity/Legal Involvement Pertinent to Current Situation/Hospitalization: No - Comment as needed  Activities of Daily Living Home Assistive Devices/Equipment: None ADL Screening (condition at  time of admission) Patient's cognitive ability adequate to safely complete daily activities?: Yes Is the patient deaf or have difficulty hearing?: No Does the patient have difficulty seeing, even when wearing glasses/contacts?: No Does the patient have difficulty concentrating, remembering, or making decisions?: No Patient able to express need for assistance with ADLs?: Yes Does the patient have difficulty dressing or bathing?: Yes Independently performs ADLs?: No Communication: Independent Dressing (OT): Needs assistance Is this a change from baseline?: Change from baseline, expected to last >3 days Grooming: Needs assistance Is this a change from baseline?: Change from baseline, expected to last >3 days Feeding: Independent Bathing: Independent Toileting: Needs assistance Is this a change from baseline?: Change from baseline, expected to last >3days In/Out Bed: Needs assistance Is this a change from baseline?: Change from baseline, expected to last >3 days Walks in Home: Needs assistance Is this a change from baseline?: Change from baseline, expected to last >3 days Does the patient have difficulty walking or climbing stairs?: Yes Weakness of Legs: Both Weakness of Arms/Hands: None  Permission Sought/Granted Permission sought to share information with : Case Manager, Customer service manager, Family Supports Permission granted to share information with : Yes, Verbal Permission Granted  Share Information with NAME: Juliann Pulse  Permission granted to share info w AGENCY: SNFs  Permission granted to share info w Relationship: sister  Permission granted to share info w Contact Information: [phone]  Emotional Assessment Appearance:: Appears stated age  Attitude/Demeanor/Rapport: Gracious Affect (typically observed): Calm Orientation: : Oriented to Self, Oriented to Place, Oriented to  Time, Oriented to Situation Alcohol / Substance Use: Not Applicable Psych Involvement: No  (comment)  Admission diagnosis:  Fall [W19.XXXA] Patient Active Problem List   Diagnosis Date Noted  . Hip fracture ([place]) 10/19/2018  . Sick sinus syndrome ([place])   . Presence of permanent cardiac pacemaker   . Paroxysmal atrial fibrillation (HCC)   . Osteoarthritis   . Hypothyroidism   . Hypertension   . Hyperlipidemia   . Hemorrhoids   . GERD (gastroesophageal reflux disease)   . Diverticulosis   . Coronary artery disease   . Complication of anesthesia   . Aortic stenosis, severe   . Pericarditis 01/31/2017  . Acute on chronic diastolic heart failure ([place]) 01/30/2017  . Acute blood loss as cause of postoperative anemia 01/30/2017  . S/P TAVR (transcatheter aortic valve replacement) 01/29/2017  . Anemia   . CKD (chronic kidney disease)   . CAD in native artery 12/20/2016  . Anticoagulated 05/31/2016  . Pacemaker-Medtronic 01/01/2012  . Long term current use of anticoagulant therapy 06/18/2011  . Severe aortic stenosis 09/01/2010  . BRADYCARDIA-TACHYCARDIA SYNDROME 03/31/2010  . HLD (hyperlipidemia) 03/30/2010  . Essential hypertension 03/30/2010  . ATRIAL FIBRILLATION 03/30/2010  . OSTEOARTHRITIS 03/30/2010   PCP:  [name], MD Pharmacy:   [facility] [phone] - [place], [address] AT [facility] OF [address] [place] [place] [address] Phone: [phone] Fax: [phone]     Social Determinants of Health ([place]) Interventions    Readmission Risk Interventions No flowsheet data found.

## 2018-10-23 NOTE — Progress Notes (Signed)
Inpatient Rehabilitation Admissions Coordinator  [facility] medicare has denied admission to inpt rehab at this time. I met with patient and her sister at bedside and they are aware. Patient would like to pursue SNF. I have notified Caryl Pina, SW as well as Dr. [place] Pink. We will sign off at this time.  Danne Baxter, RN, MSN Rehab Admissions Coordinator [phone] 10/23/2018 10:24 AM

## 2018-10-23 NOTE — Progress Notes (Signed)
Nutrition Follow-up   RD working remotely.  DOCUMENTATION CODES:   Not applicable  INTERVENTION:  Continue Ensure Enlive po BID, each supplement provides 350 kcal and 20 grams of protein  Encourage adequate PO intake.  NUTRITION DIAGNOSIS:   Increased nutrient needs related to post-op healing as evidenced by estimated needs; ongoing  GOAL:   Patient will meet greater than or equal to 90% of their needs; progressing  MONITOR:   PO intake, Supplement acceptance, Skin, Weight trends, Labs, I & O's  REASON FOR ASSESSMENT:   Consult Hip fracture protocol  ASSESSMENT:   85 year old female patient, lives alone independently, PMH of CAD s/p DES to LAD 12/20/2016, PAF on Eliquis, PPM, s/p TAVR 82/64/1583, chronic diastolic CHF, HTN, HLD, hypothyroid, GERD and CKD 3 admitted following mechanical fall leading to left hip fracture, s/p IM nailing/ORIF by orthopedics 8/16.  Pt is currently on a soft diet. Meal completion has been 50-100% Pt currently has Ensure ordered with varied consumption. RD to continue with current orders to aid in caloric and protein needs as well as in post op healing. Labs and medications reviewed.  Diet Order:   Diet Order            DIET SOFT Room service appropriate? Yes; Fluid consistency: Thin  Diet effective now              EDUCATION NEEDS:   Not appropriate for education at this time  Skin:  Skin Assessment: Skin Integrity Issues: Skin Integrity Issues:: Incisions Incisions: L hip  Last BM:  8/19  Height:   Ht Readings from Last 1 Encounters:  10/19/18 5\' 5"  (1.651 m)    Weight:   Wt Readings from Last 1 Encounters:  10/22/18 59.8 kg    Ideal Body Weight:  56.8 kg  BMI:  Body mass index is 21.94 kg/m.  Estimated Nutritional Needs:   Kcal:  1500-1700  Protein:  65-75 grams  Fluid:  >/= 1.5 L/day    Corrin Parker, MS, RD, LDN Pager # [phone] After hours/ weekend pager # [phone]

## 2018-10-23 NOTE — Care Management Important Message (Signed)
Important Message  Patient Details  Name: [name] [name] MRN: [id number] Date of Birth: 22-Jun-1929   Medicare Important Message Given:  Yes     Ky Moskowitz 10/23/2018, 3:01 PM

## 2018-10-23 NOTE — Progress Notes (Signed)
[place] has initiated insurance auth today. Pending auth for patient's discharge to [facility].   [place], [place]

## 2018-10-23 NOTE — NC FL2 (Signed)
[place] MEDICAID FL2 LEVEL OF CARE SCREENING TOOL     IDENTIFICATION  Patient Name: [name] [name] Birthdate: 09/19/1929 Sex: female Admission Date (Current Location): 10/19/2018  [facility] and [place] Number:  Herbalist and Address:  [facility], [place] [address], [place], [place] [address]      Provider Number: [id number]  Attending Physician Name and Address:  Bonnielee Haff, MD  Relative Name and Phone Number:  Juliann Pulse ([phone]    Current Level of Care: Hospital Recommended Level of Care: [place] Prior Approval Number:    Date Approved/Denied:   PASRR Number: [id number]  Discharge Plan: SNF    Current Diagnoses: Patient Active Problem List   Diagnosis Date Noted  . Hip fracture ([place]) 10/19/2018  . Sick sinus syndrome ([place])   . Presence of permanent cardiac pacemaker   . Paroxysmal atrial fibrillation (HCC)   . Osteoarthritis   . Hypothyroidism   . Hypertension   . Hyperlipidemia   . Hemorrhoids   . GERD (gastroesophageal reflux disease)   . Diverticulosis   . Coronary artery disease   . Complication of anesthesia   . Aortic stenosis, severe   . Pericarditis 01/31/2017  . Acute on chronic diastolic heart failure ([place]) 01/30/2017  . Acute blood loss as cause of postoperative anemia 01/30/2017  . S/P TAVR (transcatheter aortic valve replacement) 01/29/2017  . Anemia   . CKD (chronic kidney disease)   . CAD in native artery 12/20/2016  . Anticoagulated 05/31/2016  . Pacemaker-Medtronic 01/01/2012  . Long term current use of anticoagulant therapy 06/18/2011  . Severe aortic stenosis 09/01/2010  . BRADYCARDIA-TACHYCARDIA SYNDROME 03/31/2010  . HLD (hyperlipidemia) 03/30/2010  . Essential hypertension 03/30/2010  . ATRIAL FIBRILLATION 03/30/2010  . OSTEOARTHRITIS 03/30/2010    Orientation RESPIRATION BLADDER Height & Weight     Self, Time, Situation, Place  O2(intermittent nasal cannula  2L/m) Incontinent, External catheter Weight: 131 lb 13.4 oz (59.8 kg) Height:  5\' 5"  (165.1 cm)  BEHAVIORAL SYMPTOMS/MOOD NEUROLOGICAL BOWEL NUTRITION STATUS      Continent Diet(see discharge summary)  AMBULATORY STATUS COMMUNICATION OF NEEDS Skin   Limited Assist Verbally Other (Comment), Surgical wounds(left hip closed surgical incision)                       Personal Care Assistance Level of Assistance  Bathing, Feeding, Dressing, Total care Bathing Assistance: Limited assistance Feeding assistance: Independent Dressing Assistance: Limited assistance Total Care Assistance: Limited assistance   Functional Limitations Info  Sight, Hearing, Speech Sight Info: Adequate Hearing Info: Adequate Speech Info: Adequate    SPECIAL CARE FACTORS FREQUENCY  PT (By licensed PT), OT (By licensed OT)     PT Frequency: min 5x weekly OT Frequency: min 5x weekly            Contractures Contractures Info: Not present    Additional Factors Info  Code Status, Allergies Code Status Info: DNR Allergies Info: Penicillins, Tetanus Toxoid, Demerol, Codeine           Current Medications (10/23/2018):  This is the current hospital active medication list Current Facility-Administered Medications  Medication Dose Route Frequency Provider Last Rate Last Dose  . acetaminophen (TYLENOL) tablet 650 mg  650 mg Oral Q6H Ainsley Spinner, PA-C   650 mg at 10/23/18 0630  . amiodarone (PACERONE) tablet 100 mg  100 mg Oral Daily Ainsley Spinner, PA-C   100 mg at 10/23/18 8937  . apixaban (ELIQUIS) tablet 2.5  mg  2.5 mg Oral BID Modena Jansky, MD   2.5 mg at 10/23/18 0943  . bisacodyl (DULCOLAX) EC tablet 5 mg  5 mg Oral Daily PRN Ainsley Spinner, PA-C      . calcium carbonate (OS-CAL - dosed in mg of elemental calcium) tablet 625 mg  625 mg Oral BID WC Ainsley Spinner, PA-C   625 mg at 10/23/18 5329  . cholecalciferol (VITAMIN D3) tablet 2,000 Units  2,000 Units Oral BID Ainsley Spinner, PA-C   2,000 Units at  10/23/18 9242  . docusate sodium (COLACE) capsule 100 mg  100 mg Oral BID Ainsley Spinner, PA-C   100 mg at 10/23/18 0944  . ezetimibe (ZETIA) tablet 10 mg  10 mg Oral Daily Ainsley Spinner, PA-C   10 mg at 10/23/18 6834  . feeding supplement (ENSURE ENLIVE) (ENSURE ENLIVE) liquid 237 mL  237 mL Oral BID BM Modena Jansky, MD   237 mL at 10/23/18 0944  . HYDROcodone-acetaminophen (NORCO/VICODIN) 5-325 MG per tablet 1-2 tablet  1-2 tablet Oral Q6H PRN Ainsley Spinner, PA-C   2 tablet at 10/23/18 0631  . levothyroxine (SYNTHROID) tablet 125 mcg  125 mcg Oral QAC breakfast Ainsley Spinner, PA-C   125 mcg at 10/23/18 0631  . menthol-cetylpyridinium (CEPACOL) lozenge 3 mg  1 lozenge Oral PRN Ainsley Spinner, PA-C       Or  . phenol (CHLORASEPTIC) mouth spray 1 spray  1 spray Mouth/Throat PRN Ainsley Spinner, PA-C      . methocarbamol (ROBAXIN) 500 mg in dextrose 5 % 50 mL IVPB  500 mg Intravenous Q6H PRN Bonnielee Haff, MD      . methocarbamol (ROBAXIN) tablet 500 mg  500 mg Oral Q6H PRN Bonnielee Haff, MD      . metoCLOPramide (REGLAN) tablet 5-10 mg  5-10 mg Oral Q8H PRN Ainsley Spinner, PA-C       Or  . metoCLOPramide (REGLAN) injection 5-10 mg  5-10 mg Intravenous Q8H PRN Ainsley Spinner, PA-C   10 mg at 10/20/18 0843  . metoprolol tartrate (LOPRESSOR) tablet 12.5 mg  12.5 mg Oral BID Vernell Leep D, MD   12.5 mg at 10/22/18 2147  . ondansetron (ZOFRAN) tablet 4 mg  4 mg Oral Q6H PRN Ainsley Spinner, PA-C       Or  . ondansetron [facility]) injection 4 mg  4 mg Intravenous Q6H PRN Ainsley Spinner, PA-C   4 mg at 10/20/18 0659  . pantoprazole (PROTONIX) EC tablet 40 mg  40 mg Oral Daily Ainsley Spinner, PA-C   40 mg at 10/23/18 1962  . polyethylene glycol (MIRALAX / GLYCOLAX) packet 17 g  17 g Oral Daily PRN Ainsley Spinner, PA-C      . polyvinyl alcohol (LIQUIFILM TEARS) 1.4 % ophthalmic solution 1-2 drop  1-2 drop Both Eyes TID PRN Ainsley Spinner, PA-C      . vitamin C (ASCORBIC ACID) tablet 500 mg  500 mg Oral Daily Ainsley Spinner, PA-C   500  mg at 10/23/18 2297     Discharge Medications: Please see discharge summary for a list of discharge medications.  Relevant Imaging Results:  Relevant Lab Results:   Additional Information [id number]  Alberteen Sam, LCSW

## 2018-10-23 NOTE — Progress Notes (Addendum)
PROGRESS NOTE   [name] [name]  [id number]    DOB: 08-03-1929    DOA: 10/19/2018  PCP: [name], MD    Brief Narrative:  85 year old female patient, lives alone independently, PMH of CAD s/p DES to LAD 12/20/2016, PAF on Eliquis, PPM, s/p TAVR [id number], chronic diastolic CHF, HTN, HLD, hypothyroid, GERD and CKD 3 admitted following mechanical fall leading to left hip fracture, s/p IM nailing/ORIF by orthopedics 8/16.  Mild orthostatic symptoms 8/17, better.  Possible DC to CIR pending further clinical improvement.   Assessment & Plan:  Left intertrochanteric femur/hip fracture, s/p ORIF (IM nailing) 10/19/2018  Orthopedics consulted and s/p IM nailing 8/16  Continue prior home dose of Eliquis which covers DVT prophylaxis  Outpatient orthopedic follow-up in 2 weeks for suture removal and further evaluation.  CIR was initially recommended.  However insurance has denied.  Pursuing skilled nursing facility for short-term rehab.  Osteoporosis likely contributing.  Recommend outpatient bone density evaluation within the next 4 weeks or initiation of a bisphosphonate.  Orthostatic hypotension  Noted on 8/17 morning.  BP dropped from 124/78 sitting >96/45 standing.  Could have been due to meds (antihypertensives, opioids) and ABLA  Antihypertensives were held.  Metoprolol was reintroduced.  Continue to hold amlodipine and losartan.  Blood pressure remains reasonably well controlled.  Continue to monitor.  Postop acute blood loss anemia  Hemoglobin remained stable.  Transfuse if it is less than 7.  Thrombocytopenia  Mild.  Improved this morning.  Paroxysmal A. fib/PPM  Rate controlled.  Continue amiodarone and metoprolol at reduced dose.  Eliquis held preoperatively and resumed 2.5 mg twice daily on POD #1 on 8/17  Essential hypertension  Reduced dose of metoprolol 12.5 mg twice daily for HTN and A. fib.  Controlled/soft.  Hyperlipidemia  Continue Zetia.   Acute kidney injury complicating stage III CKD  Creatinine went up from 1.1 to 1.95, likely due to hemodynamics/hypotension from reasons indicated above.  Losartan was placed on hold.  Patient was given gentle IV fluids.  Creatinine has improved.  Seems to be back to baseline.  IV fluids discontinued.  Monitor urine output  Hypothyroid  Continue Synthroid.  CAD/s/p TAVR/chronic diastolic CHF  Stable without angina and clinically euvolemic.  Continue to hold home dose of Lasix.  Hypoxia  Mild.  Possibly from atelectasis.  No clinical CHF.  Continue incentive spirometry and monitor closely.  Oxygen supplementation as needed.  Currently saturating normal on room air.  Dysphagia Patient experiencing some difficulty swallowing at times.  Change her diet to soft.  Last endoscopy was in 2018 which did not show any abnormalities in the esophagus or the stomach.  Use noninvasive measures for now.  Patient too frail to undergo endoscopy at this time.  She should follow-up with Dr. Henrene Pastor with [facility] gastroenterology.  DVT prophylaxis: On Eliquis Code Status: DNR Family Communication: Discussed with patient. Disposition: Plan is for placement to skilled nursing facility   Consultants:  Orthopedics Rehab MD, pending.  Procedures:  Left hip IM nailing on 8/16  Antimicrobials:  Perioperative antibiotics   Subjective: Patient denies any complaints this morning.  Requesting switching her IV Robaxin to oral formulation.  Occasional spasm.  No shortness of breath or chest pain.  Does mention difficulty swallowing at times.  But the food does go down eventually.  Has a history of GERD.  Objective:  Vitals:   10/22/18 1417 10/22/18 2147 10/23/18 0400 10/23/18 0937  BP: (!) 141/53 (!) 144/53 (!) 129/45 (!) 117/50  Pulse: 96 79 78 67  Resp: 18  16   Temp: 98.2 F (36.8 C)  98.3 F (36.8 C) 98 F (36.7 C)  TempSrc: Oral  Oral Oral  SpO2: 98%  95% 97%  Weight:      Height:         Examination:  General appearance: Awake alert.  In no distress Resp: Clear to auscultation bilaterally.  Normal effort Cardio: S1-S2 is normal regular.  No S3-S4.  No rubs murmurs or bruit GI: Abdomen is soft.  Nontender nondistended.  Bowel sounds are present normal.  No masses organomegaly Extremities: No edema.   Neurologic:   No focal neurological deficits.     Data Reviewed: I have personally reviewed following labs and imaging studies  CBC: Recent Labs  Lab 10/19/18 1208 10/20/18 0321 10/21/18 0314 10/22/18 0311 10/23/18 0536  WBC 8.0 9.4 9.3 8.4 7.1  NEUTROABS 5.1  --   --   --   --   HGB 12.0 9.8* 7.9* 8.2* 8.5*  HCT 38.1 31.1* 24.5* 25.3* 26.0*  MCV 98.2 98.1 97.6 94.4 94.9  PLT 178 160 142* 129* [id number]*   Basic Metabolic Panel: Recent Labs  Lab 10/19/18 1208 10/20/18 0321 10/21/18 0314 10/22/18 0311 10/23/18 0536  NA 136 136 131* 134* 138  K 4.4 5.1 4.6 4.4 4.8  CL 102 102 97* 102 106  CO2 23 27 26 26 24   GLUCOSE 111* 170* 119* 121* 106*  BUN 28* 20 35* 27* 16  CREATININE 1.11* 1.12* 1.95* 1.31* 0.96  CALCIUM 9.3 8.5* 8.5* 8.2* 8.5*     Recent Results (from the past 240 hour(s))  SARS Coronavirus 2 [facility] order, Performed in [facility] hospital lab) Nasopharyngeal Nasopharyngeal Swab     Status: None   Collection Time: 10/19/18 12:09 PM   Specimen: Nasopharyngeal Swab  Result Value Ref Range Status   SARS Coronavirus 2 NEGATIVE NEGATIVE Final    Comment: (NOTE) If result is NEGATIVE SARS-CoV-2 target nucleic acids are NOT DETECTED. The SARS-CoV-2 RNA is generally detectable in upper and lower  respiratory specimens during the acute phase of infection. The lowest  concentration of SARS-CoV-2 viral copies this assay can detect is 250  copies / mL. A negative result does not preclude SARS-CoV-2 infection  and should not be used as the sole basis for treatment or other  patient management decisions.  A negative result may occur with  improper  specimen collection / handling, submission of specimen other  than nasopharyngeal swab, presence of viral mutation(s) within the  areas targeted by this assay, and inadequate number of viral copies  (<250 copies / mL). A negative result must be combined with clinical  observations, patient history, and epidemiological information. If result is POSITIVE SARS-CoV-2 target nucleic acids are DETECTED. The SARS-CoV-2 RNA is generally detectable in upper and lower  respiratory specimens dur ing the acute phase of infection.  Positive  results are indicative of active infection with SARS-CoV-2.  Clinical  correlation with patient history and other diagnostic information is  necessary to determine patient infection status.  Positive results do  not rule out bacterial infection or co-infection with other viruses. If result is PRESUMPTIVE POSTIVE SARS-CoV-2 nucleic acids MAY BE PRESENT.   A presumptive positive result was obtained on the submitted specimen  and confirmed on repeat testing.  While 2019 novel coronavirus  (SARS-CoV-2) nucleic acids may be present in the submitted sample  additional confirmatory testing may be necessary for epidemiological  and /  or clinical management purposes  to differentiate between  SARS-CoV-2 and other Sarbecovirus currently known to infect humans.  If clinically indicated additional testing with an alternate test  methodology [phone]) is advised. The SARS-CoV-2 RNA is generally  detectable in upper and lower respiratory sp ecimens during the acute  phase of infection. The expected result is Negative. Fact Sheet for Patients:  StrictlyIdeas.no Fact Sheet for Healthcare Providers: BankingDealers.co.za This test is not yet approved or cleared by the [place] FDA and has been authorized for detection and/or diagnosis of SARS-CoV-2 by FDA under an Emergency Use Authorization (EUA).  This EUA will remain in  effect (meaning this test can be used) for the duration of the COVID-19 declaration under Section 564(b)(1) of the Act, 21 U.S.C. section 360bbb-3(b)(1), unless the authorization is terminated or revoked sooner. Performed at [facility], [place] [address], [place], [place] [address]          Radiology Studies: No results found.      Scheduled Meds: . acetaminophen  650 mg Oral Q6H  . amiodarone  100 mg Oral Daily  . apixaban  2.5 mg Oral BID  . calcium carbonate  625 mg Oral BID WC  . cholecalciferol  2,000 Units Oral BID  . docusate sodium  100 mg Oral BID  . ezetimibe  10 mg Oral Daily  . feeding supplement (ENSURE ENLIVE)  237 mL Oral BID BM  . levothyroxine  125 mcg Oral QAC breakfast  . metoprolol tartrate  12.5 mg Oral BID  . pantoprazole  40 mg Oral Daily  . vitamin C  500 mg Oral Daily   Continuous Infusions: . methocarbamol (ROBAXIN) IV       LOS: 4 days     Bonnielee Haff,  [facility]  To contact the attending provider between 7A-7P or the covering provider during after hours 7P-7A, please log into the web site [email or web address] and access using universal [place] password for that web site. If you do not have the password, please call the hospital operator.  10/23/2018, 12:08 PM

## 2018-10-23 NOTE — Progress Notes (Signed)
Physical Therapy Treatment Patient Details Name: [name] [name] MRN: [id number] DOB: 1929-11-14 Today's Date: 10/23/2018    History of Present Illness Pt is an 85 y.o. female admitted 10/19/18 after fall when stepping off curb sustaining L intertrochanteric hip fx; s/p IM nailing 8/16. PMH includes pacemaker, CAD, HTN, CHF, CKD 3.   PT Comments    Pt progressing with mobility. Requires min guard to minA for transfers and short ambulation distance with RW; pt remains limited by generalized weakness, pain and decreased activity tolerance. Requires increased assist for ADLs. Insurance declined CIR; pt pursuing SNF-level therapies. Will continue to follow acutely.  Sitting BP 124/51 Post-ambulation BP 132/50 SpO2 100% on RA HR 72    Follow Up Recommendations  SNF;Supervision/Assistance - 24 hour(CIR declined)     Equipment Recommendations  Rolling walker with 5" wheels;3in1 (PT)    Recommendations for Other Services       Precautions / Restrictions Precautions Precautions: Fall Restrictions Weight Bearing Restrictions: Yes LLE Weight Bearing: Weight bearing as tolerated    Mobility  Bed Mobility Overal bed mobility: Needs Assistance Bed Mobility: Supine to Sit     Supine to sit: Min guard     General bed mobility comments: Reliant on use of bed rail and HOB elevated; improved ability to manage LLE, increased time and effort. C/o dizziness upon sitting; VSS  Transfers Overall transfer level: Needs assistance Equipment used: Rolling walker (2 wheeled) Transfers: Sit to/from Stand Sit to Stand: Min guard;Min assist         General transfer comment: Close min guard to stand, heavy reliance on BUE support, intermittent minA to steady RW. Good carryover of correct hand placement  Ambulation/Gait Ambulation/Gait assistance: Min guard Gait Distance (Feet): 28 Feet(+44) Assistive device: Rolling walker (2 wheeled) Gait Pattern/deviations: Step-through pattern;Decreased  weight shift to left;Antalgic;Trunk flexed;Decreased stride length Gait velocity: Decreased Gait velocity interpretation: <1.31 ft/sec, indicative of household ambulator General Gait Details: Slow, fatigued, antalgic gait with RW and min guard for balance. 1x seated rest break due to fatigue; VSS   Stairs             Wheelchair Mobility    Modified Rankin (Stroke Patients Only)       Balance Overall balance assessment: Needs assistance Sitting-balance support: No upper extremity supported;Feet supported Sitting balance-Leahy Scale: Fair       Standing balance-Leahy Scale: Poor Standing balance comment: Reliant on UE support                            Cognition Arousal/Alertness: Awake/alert Behavior During Therapy: WFL for tasks assessed/performed Overall Cognitive Status: Within Functional Limits for tasks assessed                                        Exercises General Exercises - Lower Extremity Long Arc Quad: AROM;Left;Seated Hip Flexion/Marching: AROM;Left;Seated    General Comments General comments (skin integrity, edema, etc.): Sister present and supportive      Pertinent Vitals/Pain Pain Assessment: Faces Faces Pain Scale: Hurts little more Pain Location: LLE Pain Descriptors / Indicators: Discomfort;Grimacing;Guarding Pain Intervention(s): Monitored during session    Home Living                      Prior Function            PT Goals (current goals  can now be found in the care plan section) Acute Rehab PT Goals Patient Stated Goal: CIR declined, so now pt decining SNF vs home PT Goal Formulation: With patient Time For Goal Achievement: 11/03/18 Potential to Achieve Goals: Good Progress towards PT goals: Progressing toward goals    Frequency    Min 5X/week      PT Plan Current plan remains appropriate    Co-evaluation              AM-PAC PT "6 Clicks" Mobility   Outcome Measure  Help  needed turning from your back to your side while in a flat bed without using bedrails?: A Little Help needed moving from lying on your back to sitting on the side of a flat bed without using bedrails?: A Little Help needed moving to and from a bed to a chair (including a wheelchair)?: A Little Help needed standing up from a chair using your arms (e.g., wheelchair or bedside chair)?: A Little Help needed to walk in hospital room?: A Little Help needed climbing 3-5 steps with a railing? : A Lot 6 Click Score: 17    End of Session Equipment Utilized During Treatment: Gait belt Activity Tolerance: Patient tolerated treatment well Patient left: in chair;with call bell/phone within reach;with family/visitor present Nurse Communication: Mobility status PT Visit Diagnosis: Other abnormalities of gait and mobility (R26.89);Pain Pain - Right/Left: Left Pain - part of body: Leg     Time: 6004-5997 PT Time Calculation (min) (ACUTE ONLY): 25 min  Charges:  $Gait Training: 8-22 mins $Therapeutic Activity: 8-22 mins                    Mabeline Caras, PT, DPT Acute Rehabilitation Services  Pager [phone] Office [place] 10/23/2018, 1:12 PM

## 2018-10-24 DIAGNOSIS — R1013 Epigastric pain: Secondary | ICD-10-CM

## 2018-10-24 LAB — COMPREHENSIVE METABOLIC PANEL
ALT: 17 U/L (ref 0–44)
AST: 29 U/L (ref 15–41)
Albumin: 2.9 g/dL — ABNORMAL LOW (ref 3.5–5.0)
Alkaline Phosphatase: 54 U/L (ref 38–126)
Anion gap: 8 (ref 5–15)
BUN: 15 mg/dL (ref 8–23)
CO2: 26 mmol/L (ref 22–32)
Calcium: 8.7 mg/dL — ABNORMAL LOW (ref 8.9–10.3)
Chloride: 104 mmol/L (ref 98–111)
Creatinine, Ser: 1.13 mg/dL — ABNORMAL HIGH (ref 0.44–1.00)
GFR calc Af Amer: 51 mL/min — ABNORMAL LOW (ref >60)
GFR calc non Af Amer: 44 mL/min — ABNORMAL LOW (ref >60)
Glucose, Bld: 127 mg/dL — ABNORMAL HIGH (ref 70–99)
Potassium: 4.6 mmol/L (ref 3.5–5.1)
Sodium: 138 mmol/L (ref 135–145)
Total Bilirubin: 1.9 mg/dL — ABNORMAL HIGH (ref 0.3–1.2)
Total Protein: 5.5 g/dL — ABNORMAL LOW (ref 6.5–8.1)

## 2018-10-24 LAB — LIPASE, BLOOD: Lipase: 30 U/L (ref 11–51)

## 2018-10-24 MED ORDER — VITAMIN D3 25 MCG PO TABS: 2000.0000 [IU] | ORAL_TABLET | Freq: Two times a day (BID) | ORAL | 2 refills | Status: DC

## 2018-10-24 MED ORDER — PANTOPRAZOLE SODIUM 40 MG PO TBEC
40.0000 mg | DELAYED_RELEASE_TABLET | Freq: Two times a day (BID) | ORAL | Status: DC
  Administered 2018-10-24 – 2018-10-29 (×11): 40 mg via ORAL
  Filled 2018-10-24 (×11): qty 1

## 2018-10-24 MED ORDER — ACETAMINOPHEN 325 MG PO TABS: 650.0000 mg | ORAL_TABLET | Freq: Four times a day (QID) | ORAL | 0 refills | Status: DC

## 2018-10-24 MED ORDER — PANTOPRAZOLE SODIUM 40 MG PO TBEC: 40.0000 mg | DELAYED_RELEASE_TABLET | Freq: Two times a day (BID) | ORAL | 3 refills | Status: DC

## 2018-10-24 MED ORDER — METOPROLOL TARTRATE 25 MG PO TABS: 12.5000 mg | ORAL_TABLET | Freq: Two times a day (BID) | ORAL | 1 refills | Status: DC

## 2018-10-24 MED ORDER — METHOCARBAMOL 500 MG PO TABS: 500.0000 mg | ORAL_TABLET | Freq: Three times a day (TID) | ORAL | 0 refills | Status: DC | PRN

## 2018-10-24 MED ORDER — DICYCLOMINE HCL 10 MG PO CAPS
10.0000 mg | ORAL_CAPSULE | Freq: Two times a day (BID) | ORAL | Status: DC | PRN
  Filled 2018-10-24: qty 1

## 2018-10-24 MED ORDER — POLYETHYLENE GLYCOL 3350 17 G PO PACK: 17.0000 g | PACK | Freq: Every day | ORAL | 0 refills | Status: DC

## 2018-10-24 MED ORDER — HYDROCODONE-ACETAMINOPHEN 5-325 MG PO TABS: 1.0000 | ORAL_TABLET | Freq: Four times a day (QID) | ORAL | 0 refills | Status: DC | PRN

## 2018-10-24 MED ORDER — DOCUSATE SODIUM 100 MG PO CAPS: 100.0000 mg | ORAL_CAPSULE | Freq: Two times a day (BID) | ORAL | 0 refills | Status: DC

## 2018-10-24 MED ORDER — ALUM & MAG HYDROXIDE-SIMETH 200-200-20 MG/5ML PO SUSP
30.0000 mL | Freq: Once | ORAL | Status: AC
  Administered 2018-10-24: 30 mL via ORAL
  Filled 2018-10-24: qty 30

## 2018-10-24 MED ORDER — ENSURE ENLIVE PO LIQD: 237.0000 mL | Freq: Two times a day (BID) | ORAL | 12 refills | Status: AC

## 2018-10-24 MED ORDER — ASCORBIC ACID 500 MG PO TABS: 500.0000 mg | ORAL_TABLET | Freq: Every day | ORAL | 2 refills | Status: DC

## 2018-10-24 MED ORDER — POLYETHYLENE GLYCOL 3350 17 G PO PACK
17.0000 g | PACK | Freq: Every day | ORAL | Status: DC
  Administered 2018-10-24: 17 g via ORAL
  Filled 2018-10-24 (×2): qty 1

## 2018-10-24 NOTE — Discharge Instructions (Addendum)
Orthopaedic Trauma Service Discharge Instructions   General Discharge Instructions  WEIGHT BEARING STATUS: Weightbearing as tolerated left leg  RANGE OF MOTION/ACTIVITY: range of motion as tolerated left knee and hip, activity as tolerated. Ambulate with walker   Bone health:  Continue with vitamin d, calcium and vitamin C. You will need a bone density test in 4-8 weeks to further evaluate you bone health. A hip fracture is a sign of weakened bone.   Wound Care: daily wound care starting on 10/25/2018. Can leave dressings off and surgical incisions open to air if there is no drainage. See below  Discharge Wound Care Instructions  Do NOT apply any ointments, solutions or lotions to pin sites or surgical wounds.  These prevent needed drainage and even though solutions like hydrogen peroxide kill bacteria, they also damage cells lining the pin sites that help fight infection.  Applying lotions or ointments can keep the wounds moist and can cause them to breakdown and open up as well. This can increase the risk for infection. When in doubt call the office.  Surgical incisions should be dressed daily.  If any drainage is noted, use one layer of adaptic, then 4x4 gauze and tape. Alternatively you can use a mepilex type dressing. Dressing should not be occlusive. occlusive dressings can lead to wound maceration and breakdown  Once the incision is completely dry and without drainage, it may be left open to air out.  Showering may begin 36-48 hours later.  Cleaning gently with soap and water.  Traumatic wounds should be dressed daily as well.    One layer of adaptic, gauze, Kerlix, then ace wrap.  The adaptic can be discontinued once the draining has ceased    If you have a wet to dry dressing: wet the gauze with saline the squeeze as much saline out so the gauze is moist (not soaking wet), place moistened gauze over wound, then place a dry gauze over the moist one, followed by Kerlix wrap, then  ace wrap.   DVT/PE prophylaxis: Home eliquis   Diet: as you were eating previously.  Can use over the counter stool softeners and bowel preparations, such as Miralax, to help with bowel movements.  Narcotics can be constipating.  Be sure to drink plenty of fluids  PAIN MEDICATION USE AND EXPECTATIONS  You have likely been given narcotic medications to help control your pain.  After a traumatic event that results in an fracture (broken bone) with or without surgery, it is ok to use narcotic pain medications to help control one's pain.  We understand that everyone responds to pain differently and each individual patient will be evaluated on a regular basis for the continued need for narcotic medications. Ideally, narcotic medication use should last no more than 6-8 weeks (coinciding with fracture healing).   As a patient it is your responsibility as well to monitor narcotic medication use and report the amount and frequency you use these medications when you come to your office visit.   We would also advise that if you are using narcotic medications, you should take a dose prior to therapy to maximize you participation.  IF YOU ARE ON NARCOTIC MEDICATIONS IT IS NOT PERMISSIBLE TO OPERATE A MOTOR VEHICLE (MOTORCYCLE/CAR/TRUCK/MOPED) OR HEAVY MACHINERY DO NOT MIX NARCOTICS WITH OTHER CNS (CENTRAL NERVOUS SYSTEM) DEPRESSANTS SUCH AS ALCOHOL   STOP SMOKING OR USING NICOTINE PRODUCTS!!!!  As discussed nicotine severely impairs your body's ability to heal surgical and traumatic wounds but also impairs bone  healing.  Wounds and bone heal by forming microscopic blood vessels (angiogenesis) and nicotine is a vasoconstrictor (essentially, shrinks blood vessels).  Therefore, if vasoconstriction occurs to these microscopic blood vessels they essentially disappear and are unable to deliver necessary nutrients to the healing tissue.  This is one modifiable factor that you can do to dramatically increase your  chances of healing your injury.    (This means no smoking, no nicotine gum, patches, etc)  DO NOT USE NONSTEROIDAL ANTI-INFLAMMATORY DRUGS (NSAID'S)  Using products such as Advil (ibuprofen), Aleve (naproxen), Motrin (ibuprofen) for additional pain control during fracture healing can delay and/or prevent the healing response.  If you would like to take over the counter (OTC) medication, Tylenol (acetaminophen) is ok.  However, some narcotic medications that are given for pain control contain acetaminophen as well. Therefore, you should not exceed more than 4000 mg of tylenol in a day if you do not have liver disease.  Also note that there are may OTC medicines, such as cold medicines and allergy medicines that my contain tylenol as well.  If you have any questions about medications and/or interactions please ask your doctor/PA or your pharmacist.      ICE AND ELEVATE INJURED/OPERATIVE EXTREMITY  Using ice and elevating the injured extremity above your heart can help with swelling and pain control.  Icing in a pulsatile fashion, such as 20 minutes on and 20 minutes off, can be followed.    Do not place ice directly on skin. Make sure there is a barrier between to skin and the ice pack.    Using frozen items such as frozen peas works well as the conform nicely to the are that needs to be iced.  USE AN ACE WRAP OR TED HOSE FOR SWELLING CONTROL  In addition to icing and elevation, Ace wraps or TED hose are used to help limit and resolve swelling.  It is recommended to use Ace wraps or TED hose until you are informed to stop.    When using Ace Wraps start the wrapping distally (farthest away from the body) and wrap proximally (closer to the body)   Example: If you had surgery on your leg or thing and you do not have a splint on, start the ace wrap at the toes and work your way up to the thigh        If you had surgery on your upper extremity and do not have a splint on, start the ace wrap at your fingers  and work your way up to the upper arm  IF YOU ARE IN A SPLINT OR CAST DO NOT [place]   If your splint gets wet for any reason please contact the office immediately. You may shower in your splint or cast as long as you keep it dry.  This can be done by wrapping in a cast cover or garbage back (or similar)  Do Not stick any thing down your splint or cast such as pencils, money, or hangers to try and scratch yourself with.  If you feel itchy take benadryl as prescribed on the bottle for itching  IF YOU ARE IN A CAM BOOT (BLACK BOOT)  You may remove boot periodically. Perform daily dressing changes as noted below.  Wash the liner of the boot regularly and wear a sock when wearing the boot. It is recommended that you sleep in the boot until told otherwise    CALL THE OFFICE WITH ANY QUESTIONS OR CONCERNS:  [phone]   VISIT OUR WEBSITE FOR ADDITIONAL INFORMATION: [email or web address]  Information on my medicine - ELIQUIS (apixaban)  This medication education was reviewed with me or my healthcare representative as part of my discharge preparation.   Why was Eliquis prescribed for you? Eliquis was prescribed for you to reduce the risk of a blood clot forming that can cause a stroke if you have a medical condition called atrial fibrillation (a type of irregular heartbeat).  What do You need to know about Eliquis ? Take your Eliquis TWICE DAILY - one tablet in the morning and one tablet in the evening with or without food. If you have difficulty swallowing the tablet whole please discuss with your pharmacist how to take the medication safely.  Take Eliquis exactly as prescribed by your doctor and DO NOT stop taking Eliquis without talking to the doctor who prescribed the medication.  Stopping may increase your risk of developing a stroke.  Refill your prescription before you run out.  After discharge, you should have regular check-up appointments with your healthcare provider  that is prescribing your Eliquis.  In the future your dose may need to be changed if your kidney function or weight changes by a significant amount or as you get older.  What do you do if you miss a dose? If you miss a dose, take it as soon as you remember on the same day and resume taking twice daily.  Do not take more than one dose of ELIQUIS at the same time to make up a missed dose.  Important Safety Information A possible side effect of Eliquis is bleeding. You should call your healthcare provider right away if you experience any of the following: ? Bleeding from an injury or your nose that does not stop. ? Unusual colored urine (red or dark brown) or unusual colored stools (red or black). ? Unusual bruising for unknown reasons. ? A serious fall or if you hit your head (even if there is no bleeding).  Some medicines may interact with Eliquis and might increase your risk of bleeding or clotting while on Eliquis. To help avoid this, consult your healthcare provider or pharmacist prior to using any new prescription or non-prescription medications, including herbals, vitamins, non-steroidal anti-inflammatory drugs (NSAIDs) and supplements.  This website has more information on Eliquis (apixaban): http://www.eliquis.com/eliquis/home

## 2018-10-24 NOTE — Progress Notes (Signed)
Orthopedic Trauma Service Progress Note  Patient ID: [name] [name] MRN: [id number] DOB/AGE: 12-Jul-1929 85 y.o.  Subjective:  Doing good  No new ortho issues Awaiting SNF bed    ROS As above  Objective:   VITALS:   Vitals:   10/23/18 2244 10/24/18 0500 10/24/18 0526 10/24/18 0945  BP: (!) 133/49  (!) 146/54 (!) 136/53  Pulse: 72  70 62  Resp: 16  16   Temp:   98.4 F (36.9 C) 98.3 F (36.8 C)  TempSrc:   Oral Oral  SpO2: 96%  95%   Weight:  58.8 kg    Height:        Estimated body mass index is 21.57 kg/m as calculated from the following:   Height as of this encounter: 5\' 5"  (1.651 m).   Weight as of this encounter: 58.8 kg.   Intake/Output      08/20 0701 - 08/21 0700 08/21 0701 - 08/22 0700   P.O. 900    Total Intake(mL/kg) 900 (15.3)    Urine (mL/kg/hr)     Stool     Total Output     Net +900         Urine Occurrence 3 x      LABS  No results found for this or any previous visit (from the past 24 hour(s)).   PHYSICAL EXAM:   Gen: NAD, appears well Ext:       Left Lower Extremity                    Dressings are clean, dry and intact             Ext warm              + DP pulse             No DCT              Compartments are soft             motor and sensory functions intact    Assessment/Plan: 5 Days Post-Op   Principal Problem:   Hip fracture (HCC) Active Problems:   HLD (hyperlipidemia)   Essential hypertension   ATRIAL FIBRILLATION   CKD (chronic kidney disease)   S/P TAVR (transcatheter aortic valve replacement)   Hypothyroidism   Anti-infectives (From admission, onward)   Start     Dose/Rate Route Frequency Ordered Stop   10/19/18 2130  clindamycin (CLEOCIN) IVPB 600 mg     600 mg 100 mL/hr over 30 Minutes Intravenous Every 6 hours 10/19/18 2122 10/20/18 0612   10/19/18 1721  vancomycin (VANCOCIN) 1-5 GM/200ML-% IVPB    Note to Pharmacy: Clearnce Sorrel   : cabinet override      10/19/18 1721 10/20/18 0529    .  POD/HD#: 78  85 year old female s/p with left hip fracture on chronic Eliquis   -Left hip fracture s/p intramedullary nail             Weight-bear as tolerated             Range of motion as tolerated             Dressing changes as needed             Ice as needed  for swelling and pain control             TED hose for swelling if patient would like             PT and OT     Awaiting insurance auth for snf    - Pain management:             Scheduled Tylenol             Low-dose Norco if needed   - ABL anemia/Hemodynamics             improved  Stable                  - Medical issues              Per medical service                         Eliquis resumed   - DVT/PE prophylaxis:             eliquis as above    - ID:              Perioperative antibiotics completed    - Metabolic Bone Disease:             Vitamin D levels are ok                          Recommend vitamin d supplementation                          Vitamin d levels >40 have been shown to improve MSK function and decrease falls               Recommend outpatient DEXA scan in the next 4 to 8 weeks             Mechanism suggestive of osteoporosis               Pt would likely benefit from pharmacologic treatment of her osteoporosis.  We can determine which agent to use based off DEXA and additional lab work                          Would recommend either an anabolic agent such as forteo, tymlos or evenity.  Could consider prolia                          Would not use Bisphosphonates until fracture union has been achieved    - Activity:             Up with assistance             Weight-bear as tolerated with walker   - FEN/GI prophylaxis/Foley/Lines:             Regular diet     - Impediments to fracture healing:             Osteoporosis             Chronic medical comorbidities including thyroid dysfunction, CKD             Lasix  use   - Dispo:             ortho issues stable  Follow up with ortho in 10-14 days    Weightbearing: WBAT  LLE Insicional and dressing care: OK to remove dressings L leg and leave open to air with dry gauze PRN Orthopedic device(s): walker Showering: ok to shower, clean wounds with soap and water only. Do not apply any lotions or ointments to wounds (no betadine, hydrogen peroxide, neosporin, etc) VTE prophylaxis: eliquis longterm home medication  Pain control: tylenol, norco Follow - up plan: 2 weeks Contact information:  [place] MD, Ainsley Spinner PA-C    Jari Pigg, PA-C [phone] ([phone]) 10/24/2018, 10:09 AM  [facility] [place] [address] [phone] Domingo Sep (F)

## 2018-10-24 NOTE — Progress Notes (Signed)
PROGRESS NOTE   RIVIERA QUIRAM  [id number]    DOB: [birth year]    DOA: 10/19/2018  PCP: [name], MD    Brief Narrative:  85 year old female patient, lives alone independently, PMH of CAD s/p DES to LAD 12/20/2016, PAF on Eliquis, PPM, s/p TAVR [id number], chronic diastolic CHF, HTN, HLD, hypothyroid, GERD and CKD 3 admitted following mechanical fall leading to left hip fracture, s/p IM nailing/ORIF by orthopedics 8/16.     Assessment & Plan:  Left intertrochanteric femur/hip fracture, s/p ORIF (IM nailing) 10/19/2018  Orthopedics consulted and s/p IM nailing 8/16  Continue prior home dose of Eliquis which covers DVT prophylaxis  Outpatient orthopedic follow-up in 2 weeks for suture removal and further evaluation.  CIR was initially recommended.  However insurance has denied.  Pursuing skilled nursing facility for short-term rehab.  Osteoporosis likely contributing.  Recommend outpatient bone density evaluation within the next 4 weeks or initiation of a bisphosphonate.  Orthopedics not recommending initiation of bisphosphonate till fracture union has been achieved.  Orthostatic hypotension  Noted on 8/17 morning.  BP dropped from 124/78 sitting >96/45 standing.  Could have been due to meds (antihypertensives, opioids) and ABLA  Antihypertensives were held.  Metoprolol was reintroduced.  Continue to hold amlodipine and losartan.  Blood pressure remains reasonably well controlled.  Continue to monitor.  Postop acute blood loss anemia  Hemoglobin is low but stable.  Thrombocytopenia  Mild.  Improved.  Paroxysmal A. fib/PPM  Rate controlled.  Continue amiodarone and metoprolol at reduced dose.  Eliquis held preoperatively and resumed 2.5 mg twice daily on POD #1 on 8/17  Essential hypertension  Reduced dose of metoprolol 12.5 mg twice daily for HTN and A. fib.  Controlled/soft.  Hyperlipidemia  Continue Zetia.  Acute kidney injury complicating stage III  CKD  Creatinine went up from 1.1 to 1.95, likely due to hemodynamics/hypotension from reasons indicated above.  Losartan was placed on hold.  Patient was given gentle IV fluids.  Creatinine has improved.  Seems to be back to baseline.  IV fluids discontinued.  Monitor urine output  Hypothyroid  Continue Synthroid.  CAD/s/p TAVR/chronic diastolic CHF  Stable without angina and clinically euvolemic.  Continue to hold home dose of Lasix.  Hypoxia  Mild.  Possibly from atelectasis.  No clinical CHF.  Continue incentive spirometry and monitor closely.  Oxygen supplementation as needed.  Currently saturating normal on room air.  GERD/gastritis/dysphagia Patient complains of burning sensation in the upper abdomen.  Some difficulty with swallowing pills.  Food seems to go down okay.  Diet was changed to soft.  Give her Maalox.  Increase PPI to twice daily.  Abdomen is benign.  Check LFTs and lipase level.  Last endoscopy was in 2018 which did not show any abnormalities in the esophagus or the stomach.  Use noninvasive measures for now.  Patient too frail to undergo endoscopy at this time.  She should follow-up with Dr. Henrene Pastor with [facility] gastroenterology.  DVT prophylaxis: On Eliquis Code Status: DNR Family Communication: Discussed with patient. Disposition: Plan is for placement to skilled nursing facility   Consultants:  Orthopedics Rehab MD, pending.  Procedures:  Left hip IM nailing on 8/16  Antimicrobials:  Perioperative antibiotics   Subjective: Patient complains of burning sensation in the upper abdomen.  Some difficulty swallowing but no regurgitation.  Spasm in the head is better.    Objective:  Vitals:   10/24/18 0500 10/24/18 0526 10/24/18 0945 10/24/18 1103  BP:  (!) 146/54 [place] Kitchen)  136/53 (!) 120/48  Pulse:  70 62 63  Resp:  16    Temp:  98.4 F (36.9 C) 98.3 F (36.8 C)   TempSrc:  Oral Oral   SpO2:  95%  100%  Weight: 58.8 kg     Height:         Examination:  General appearance: Awake alert.  In no distress Resp: Clear to auscultation bilaterally.  Normal effort Cardio: S1-S2 is normal regular.  No S3-S4.  No rubs murmurs or bruit GI: Abdomen is soft.  Mild tenderness in the epigastric area without any rebound rigidity or guarding.  No tenderness in the right upper quadrant.  No masses or organomegaly.  Extremities: No edema.  Full range of motion of lower extremities. Neurologic: Alert and oriented x3.  No focal neurological deficits.      Data Reviewed: I have personally reviewed following labs and imaging studies  CBC: Recent Labs  Lab 10/19/18 1208 10/20/18 0321 10/21/18 0314 10/22/18 0311 10/23/18 0536  WBC 8.0 9.4 9.3 8.4 7.1  NEUTROABS 5.1  --   --   --   --   HGB 12.0 9.8* 7.9* 8.2* 8.5*  HCT 38.1 31.1* 24.5* 25.3* 26.0*  MCV 98.2 98.1 97.6 94.4 94.9  PLT 178 160 142* 129* [id number]*   Basic Metabolic Panel: Recent Labs  Lab 10/19/18 1208 10/20/18 0321 10/21/18 0314 10/22/18 0311 10/23/18 0536  NA 136 136 131* 134* 138  K 4.4 5.1 4.6 4.4 4.8  CL 102 102 97* 102 106  CO2 23 27 26 26 24   GLUCOSE 111* 170* 119* 121* 106*  BUN 28* 20 35* 27* 16  CREATININE 1.11* 1.12* 1.95* 1.31* 0.96  CALCIUM 9.3 8.5* 8.5* 8.2* 8.5*     Recent Results (from the past 240 hour(s))  SARS Coronavirus 2 [facility] order, Performed in [facility] hospital lab) Nasopharyngeal Nasopharyngeal Swab     Status: None   Collection Time: 10/19/18 12:09 PM   Specimen: Nasopharyngeal Swab  Result Value Ref Range Status   SARS Coronavirus 2 NEGATIVE NEGATIVE Final    Comment: (NOTE) If result is NEGATIVE SARS-CoV-2 target nucleic acids are NOT DETECTED. The SARS-CoV-2 RNA is generally detectable in upper and lower  respiratory specimens during the acute phase of infection. The lowest  concentration of SARS-CoV-2 viral copies this assay can detect is 250  copies / mL. A negative result does not preclude SARS-CoV-2 infection  and  should not be used as the sole basis for treatment or other  patient management decisions.  A negative result may occur with  improper specimen collection / handling, submission of specimen other  than nasopharyngeal swab, presence of viral mutation(s) within the  areas targeted by this assay, and inadequate number of viral copies  (<250 copies / mL). A negative result must be combined with clinical  observations, patient history, and epidemiological information. If result is POSITIVE SARS-CoV-2 target nucleic acids are DETECTED. The SARS-CoV-2 RNA is generally detectable in upper and lower  respiratory specimens dur ing the acute phase of infection.  Positive  results are indicative of active infection with SARS-CoV-2.  Clinical  correlation with patient history and other diagnostic information is  necessary to determine patient infection status.  Positive results do  not rule out bacterial infection or co-infection with other viruses. If result is PRESUMPTIVE POSTIVE SARS-CoV-2 nucleic acids MAY BE PRESENT.   A presumptive positive result was obtained on the submitted specimen  and confirmed on repeat testing.  While  2019 novel coronavirus  (SARS-CoV-2) nucleic acids may be present in the submitted sample  additional confirmatory testing may be necessary for epidemiological  and / or clinical management purposes  to differentiate between  SARS-CoV-2 and other Sarbecovirus currently known to infect humans.  If clinically indicated additional testing with an alternate test  methodology [phone]) is advised. The SARS-CoV-2 RNA is generally  detectable in upper and lower respiratory sp ecimens during the acute  phase of infection. The expected result is Negative. Fact Sheet for Patients:  StrictlyIdeas.no Fact Sheet for Healthcare Providers: BankingDealers.co.za This test is not yet approved or cleared by the [place] FDA and has been  authorized for detection and/or diagnosis of SARS-CoV-2 by FDA under an Emergency Use Authorization (EUA).  This EUA will remain in effect (meaning this test can be used) for the duration of the COVID-19 declaration under Section 564(b)(1) of the Act, 21 U.S.C. section 360bbb-3(b)(1), unless the authorization is terminated or revoked sooner. Performed at [facility], [place] [address], [place], [place] [address]          Radiology Studies: No results found.      Scheduled Meds: . acetaminophen  650 mg Oral Q6H  . amiodarone  100 mg Oral Daily  . apixaban  2.5 mg Oral BID  . calcium carbonate  625 mg Oral BID WC  . cholecalciferol  2,000 Units Oral BID  . docusate sodium  100 mg Oral BID  . ezetimibe  10 mg Oral Daily  . feeding supplement (ENSURE ENLIVE)  237 mL Oral BID BM  . levothyroxine  125 mcg Oral QAC breakfast  . metoprolol tartrate  12.5 mg Oral BID  . pantoprazole  40 mg Oral BID  . polyethylene glycol  17 g Oral Daily  . vitamin C  500 mg Oral Daily   Continuous Infusions: . methocarbamol (ROBAXIN) IV       LOS: 5 days     Bonnielee Haff,  [facility]  To contact the attending provider between 7A-7P or the covering provider during after hours 7P-7A, please log into the web site [email or web address] and access using universal [place] password for that web site. If you do not have the password, please call the hospital operator.  10/24/2018, 12:21 PM

## 2018-10-24 NOTE — Plan of Care (Signed)
  Problem: Activity: Goal: Ability to ambulate and perform ADLs will improve Outcome: Progressing   

## 2018-10-24 NOTE — Progress Notes (Signed)
Remote pacemaker transmission.   

## 2018-10-24 NOTE — Plan of Care (Signed)

## 2018-10-24 NOTE — Progress Notes (Signed)
Occupational Therapy Treatment Patient Details Name: [name] [name] MRN: [id number] DOB: Dec 12, 1929 Today's Date: 10/24/2018    History of present illness Pt is an 85 y.o. female admitted 10/19/18 after fall when stepping off curb sustaining L intertrochanteric hip fx; s/p IM nailing 8/16. PMH includes pacemaker, CAD, HTN, CHF, CKD 3.   OT comments  Pt seen for ADL progression and functional mobility. Pt making great progress towards OT goals requiring MIN guard for standing ADLs at sink with verbal cues for safety. Min guard for functional mobility with RW to/ from toilet and modified independent for toileting hygiene using lateral leans for pericare. Modified independent for bed mobility using bed features to get to EOB. Pt seems content with change of DC plan to go to SNF. Will continue to follow for acute OT needs.    Follow Up Recommendations  CIR    Equipment Recommendations  Other (comment)(TBD to next venue)    Recommendations for Other Services      Precautions / Restrictions Precautions Precautions: Fall Restrictions Weight Bearing Restrictions: Yes RLE Weight Bearing: Weight bearing as tolerated LLE Weight Bearing: Weight bearing as tolerated       Mobility Bed Mobility Overal bed mobility: Modified Independent Bed Mobility: Supine to Sit     Supine to sit: Modified independent (Device/Increase time)     General bed mobility comments: use of bed rail and HOB elevated  Transfers Overall transfer level: Needs assistance Equipment used: Rolling walker (2 wheeled) Transfers: Sit to/from Stand Sit to Stand: Min guard         General transfer comment: good safety awareness this session- able to recall correct hand placement    Balance                                           ADL either performed or assessed with clinical judgement   ADL Overall ADL's : Needs assistance/impaired     Grooming: Set up;Supervision/safety;Wash/dry  hands;Standing(brushing teeth) Grooming Details (indicate cue type and reason): verbal cues for safety                 Toilet Transfer: Min guard;RW;Ambulation;BSC(BSC over regular toilet) Toilet Transfer Details (indicate cue type and reason): cues for safety and body mechanics to push operative leg out when sitting on low surface Toileting- Clothing Manipulation and Hygiene: Modified independent;Sitting/lateral lean       Functional mobility during ADLs: Min guard;Rolling walker General ADL Comments: No complaints of dizziness today, cues for safety during ADLs     Vision       Perception     Praxis      Cognition Arousal/Alertness: Awake/alert Behavior During Therapy: WFL for tasks assessed/performed Overall Cognitive Status: Within Functional Limits for tasks assessed                                 General Comments: very pleasant this session- motivated and asking appropriate questions        Exercises     Shoulder Instructions       General Comments      Pertinent Vitals/ Pain       Pain Assessment: No/denies pain  Home Living  Prior Functioning/Environment              Frequency  Min 2X/week        Progress Toward Goals  OT Goals(current goals can now be found in the care plan section)  Progress towards OT goals: Progressing toward goals  Acute Rehab OT Goals Time For Goal Achievement: 11/03/18 Potential to Achieve Goals: Good  Plan Discharge plan remains appropriate    Co-evaluation                 AM-PAC OT "6 Clicks" Daily Activity     Outcome Measure   Help from another person eating meals?: None Help from another person taking care of personal grooming?: None Help from another person toileting, which includes using toliet, bedpan, or urinal?: A Little Help from another person bathing (including washing, rinsing, drying)?: A Little Help from  another person to put on and taking off regular upper body clothing?: None Help from another person to put on and taking off regular lower body clothing?: A Little 6 Click Score: 21    End of Session Equipment Utilized During Treatment: Rolling walker;Gait belt  OT Visit Diagnosis: Unsteadiness on feet (R26.81);Other abnormalities of gait and mobility (R26.89);Muscle weakness (generalized) (M62.81);Pain   Activity Tolerance Patient tolerated treatment well   Patient Left in chair;with call bell/phone within reach;with family/visitor present   Nurse Communication          Time: [id number] OT Time Calculation (min): 25 min  Charges: OT General Charges $OT Visit: 1 Visit OT Treatments $Self Care/Home Management : 23-37 mins    Amery Nakamoto Marne Meline,COTA/L 10/24/2018, 11:12 AM  [phone]

## 2018-10-24 NOTE — Progress Notes (Signed)
Physical Therapy Treatment Patient Details Name: [name] [name] MRN: [id number] DOB: [birth year] Today's Date: 10/24/2018    History of Present Illness Pt is an 85 y.o. female admitted 10/19/18 after fall when stepping off curb sustaining L intertrochanteric hip fx; s/p IM nailing 8/16. PMH includes pacemaker, CAD, HTN, CHF, CKD 3.   PT Comments    Pt progressing well with mobility. Today's session focused on gait training and BLE strengthening. Pt motivated to participate and hopeful for d/c to SNF today. If to remain admitted, will continue to follow acutely.   Follow Up Recommendations  SNF;Supervision/Assistance - 24 hour(CIR declined)     Equipment Recommendations  Rolling walker with 5" wheels;3in1 (PT)    Recommendations for Other Services       Precautions / Restrictions Precautions Precautions: Fall Restrictions Weight Bearing Restrictions: Yes RLE Weight Bearing: Weight bearing as tolerated LLE Weight Bearing: Weight bearing as tolerated    Mobility  Bed Mobility Overal bed mobility: Modified Independent Bed Mobility: Supine to Sit     Supine to sit: Modified independent (Device/Increase time)     General bed mobility comments: Received sitting in recliner  Transfers Overall transfer level: Needs assistance Equipment used: Rolling walker (2 wheeled) Transfers: Sit to/from Stand Sit to Stand: Min guard         General transfer comment: good safety awareness this session- able to recall correct hand placement  Ambulation/Gait Ambulation/Gait assistance: Min guard Gait Distance (Feet): 40 Feet(+40) Assistive device: Rolling walker (2 wheeled) Gait Pattern/deviations: Step-through pattern;Decreased weight shift to left;Antalgic;Trunk flexed;Decreased stride length Gait velocity: Decreased Gait velocity interpretation: <1.31 ft/sec, indicative of household ambulator General Gait Details: Slow, antalgic gait with RW and min guard for balance. 1x standing  rest break due to fatigue   Stairs             Wheelchair Mobility    Modified Rankin (Stroke Patients Only)       Balance Overall balance assessment: Needs assistance Sitting-balance support: No upper extremity supported;Feet supported Sitting balance-Leahy Scale: Fair       Standing balance-Leahy Scale: Poor Standing balance comment: Reliant on UE support                            Cognition Arousal/Alertness: Awake/alert Behavior During Therapy: WFL for tasks assessed/performed Overall Cognitive Status: Within Functional Limits for tasks assessed                                 General Comments: very pleasant this session- motivated and asking appropriate questions      Exercises General Exercises - Lower Extremity Ankle Circles/Pumps: AROM;Both;Seated Long Arc Quad: AROM;Both;Seated Heel Slides: Left;Seated;AAROM Hip Flexion/Marching: AAROM;Left;Seated    General Comments        Pertinent Vitals/Pain Pain Assessment: Faces Faces Pain Scale: Hurts a little bit Pain Location: LLE Pain Descriptors / Indicators: Discomfort;Grimacing;Guarding Pain Intervention(s): Monitored during session;Repositioned    Home Living                      Prior Function            PT Goals (current goals can now be found in the care plan section) Acute Rehab PT Goals Patient Stated Goal: CIR declined, so now pt planning for SNF PT Goal Formulation: With patient Time For Goal Achievement: 11/03/18 Potential to Achieve Goals: Good Progress  towards PT goals: Progressing toward goals    Frequency    Min 3X/week      PT Plan Current plan remains appropriate    Co-evaluation              AM-PAC PT "6 Clicks" Mobility   Outcome Measure  Help needed turning from your back to your side while in a flat bed without using bedrails?: A Little Help needed moving from lying on your back to sitting on the side of a flat bed  without using bedrails?: A Little Help needed moving to and from a bed to a chair (including a wheelchair)?: A Little Help needed standing up from a chair using your arms (e.g., wheelchair or bedside chair)?: A Little Help needed to walk in hospital room?: A Little Help needed climbing 3-5 steps with a railing? : A Lot 6 Click Score: 17    End of Session Equipment Utilized During Treatment: Gait belt Activity Tolerance: Patient tolerated treatment well Patient left: in chair;with call bell/phone within reach;with family/visitor present;with nursing/sitter in room Nurse Communication: Mobility status PT Visit Diagnosis: Other abnormalities of gait and mobility (R26.89);Pain Pain - Right/Left: Left Pain - part of body: Leg     Time: [id number] PT Time Calculation (min) (ACUTE ONLY): 21 min  Charges:  $Gait Training: 8-22 mins                    Mabeline Caras, PT, DPT Acute Rehabilitation Services  Pager [phone] Office [place] 10/24/2018, 1:16 PM

## 2018-10-25 DIAGNOSIS — K2900 Acute gastritis without bleeding: Secondary | ICD-10-CM

## 2018-10-25 LAB — COMPREHENSIVE METABOLIC PANEL
ALT: 21 U/L (ref 0–44)
AST: 29 U/L (ref 15–41)
Albumin: 2.6 g/dL — ABNORMAL LOW (ref 3.5–5.0)
Alkaline Phosphatase: 51 U/L (ref 38–126)
Anion gap: 6 (ref 5–15)
BUN: 16 mg/dL (ref 8–23)
CO2: 29 mmol/L (ref 22–32)
Calcium: 8.6 mg/dL — ABNORMAL LOW (ref 8.9–10.3)
Chloride: 102 mmol/L (ref 98–111)
Creatinine, Ser: 1.03 mg/dL — ABNORMAL HIGH (ref 0.44–1.00)
GFR calc Af Amer: 57 mL/min — ABNORMAL LOW (ref >60)
GFR calc non Af Amer: 49 mL/min — ABNORMAL LOW (ref >60)
Glucose, Bld: 107 mg/dL — ABNORMAL HIGH (ref 70–99)
Potassium: 5.1 mmol/L (ref 3.5–5.1)
Sodium: 137 mmol/L (ref 135–145)
Total Bilirubin: 1.9 mg/dL — ABNORMAL HIGH (ref 0.3–1.2)
Total Protein: 4.9 g/dL — ABNORMAL LOW (ref 6.5–8.1)

## 2018-10-25 MED ORDER — FLEET ENEMA 7-19 GM/118ML RE ENEM: 1.0000 | ENEMA | Freq: Every day | RECTAL | Status: DC | PRN

## 2018-10-25 MED ORDER — ALUM & MAG HYDROXIDE-SIMETH 200-200-20 MG/5ML PO SUSP
30.0000 mL | ORAL | Status: DC | PRN
  Administered 2018-10-25: 30 mL via ORAL
  Filled 2018-10-25: qty 30

## 2018-10-25 MED ORDER — POLYETHYLENE GLYCOL 3350 17 G PO PACK
17.0000 g | PACK | Freq: Two times a day (BID) | ORAL | Status: DC
  Administered 2018-10-25 – 2018-10-26 (×4): 17 g via ORAL
  Filled 2018-10-25 (×8): qty 1

## 2018-10-25 NOTE — Progress Notes (Signed)
PROGRESS NOTE   [name] [name]  [id number]    DOB: June 09, 1929    DOA: 10/19/2018  PCP: [name], MD    Brief Narrative:  85 year old female patient, lives alone independently, PMH of CAD s/p DES to LAD 12/20/2016, PAF on Eliquis, PPM, s/p TAVR 123XX123, chronic diastolic CHF, HTN, HLD, hypothyroid, GERD and CKD 3 admitted following mechanical fall leading to left hip fracture, s/p IM nailing/ORIF by orthopedics 8/16.     Assessment & Plan:  Left intertrochanteric femur/hip fracture, s/p ORIF (IM nailing) 10/19/2018  Orthopedics consulted and s/p IM nailing 8/16  Continue prior home dose of Eliquis which covers DVT prophylaxis  Outpatient orthopedic follow-up in 2 weeks for suture removal and further evaluation.  CIR was initially recommended.  However insurance has denied.  Pursuing skilled nursing facility for short-term rehab.  Osteoporosis likely contributing.  Recommend outpatient bone density evaluation within the next 4 weeks or initiation of a bisphosphonate.  Orthopedics not recommending initiation of bisphosphonate till fracture union has been achieved.  Patient remained stable from an orthopedic standpoint.  Muscle spasms have improved with Robaxin.  Orthostatic hypotension  Noted on 8/17 morning.  BP dropped from 124/78 sitting >96/45 standing.  Could have been due to meds (antihypertensives, opioids) and ABLA  Antihypertensives were held.  Metoprolol was reintroduced.  Continue to hold amlodipine and losartan.  Blood pressure remains reasonably well controlled.  Continue to monitor.  Postop acute blood loss anemia  Hemoglobin has been low but stable.  Thrombocytopenia  Mild.  Improved.  Paroxysmal A. fib/PPM  Rate controlled.  Continue amiodarone and metoprolol at reduced dose.  Eliquis held preoperatively and resumed 2.5 mg twice daily on POD #1 on 8/17  Essential hypertension  Reduced dose of metoprolol 12.5 mg twice daily for HTN and A.  fib.  Blood pressure is reasonably well controlled.  Continue to monitor closely.  Hyperlipidemia  Continue Zetia.  Acute kidney injury complicating stage III CKD  Creatinine went up from 1.1 to 1.95, likely due to hemodynamics/hypotension from reasons indicated above.  Losartan was placed on hold.  Patient was given gentle IV fluids.  Creatinine has improved.  She is making urine.  Back to her baseline.  Hypothyroid  Continue Synthroid.  CAD/s/p TAVR/chronic diastolic CHF  Stable without angina and clinically euvolemic.  Continue to hold home dose of Lasix.  Hypoxia Mild.  Possibly from atelectasis.  No clinical CHF.  Now saturating normal on room air.  Continue incentive spirometry.    GERD/gastritis/dysphagia Patient complains of burning sensation in the upper abdomen.  Some difficulty with swallowing pills.  Food seems to go down okay.  Diet was changed to soft.  She was given Maalox.  PPI dose was increased to twice a day.  Abdomen benign.  LFTs and lipase levels are normal.  Symptoms have improved this morning. Last endoscopy was in 2018 which did not show any abnormalities in the esophagus or the stomach.  Continue just with conservative treatment for now.  Patient too frail to undergo endoscopy at this time.  She should follow-up with Dr. Henrene Pastor with [facility] gastroenterology.  DVT prophylaxis: On Eliquis Code Status: DNR Family Communication: Discussed with patient. Disposition: Plan is for placement to skilled nursing facility   Consultants:  Orthopedics Rehab MD, pending.  Procedures:  Left hip IM nailing on 8/16  Antimicrobials:  Perioperative antibiotics   Subjective: Patient states that she is feeling much better this morning.  Burning sensation in the abdomen has resolved.  Spasms  in the leg has improved.    Objective:  Vitals:   10/24/18 2010 10/25/18 0102 10/25/18 0447 10/25/18 0901  BP: (!) 136/45  (!) 126/57 (!) 149/56  Pulse: 63  (!) 59 62   Resp: 14  16 16   Temp: 97.6 F (36.4 C)  98.6 F (37 C) (!) 97.5 F (36.4 C)  TempSrc: Oral  Oral Oral  SpO2: 98%  99% 97%  Weight:  60.8 kg    Height:        Examination:  General appearance: Awake alert.  In no distress Resp: Clear to auscultation bilaterally.  Normal effort Cardio: S1-S2 is normal regular.  No S3-S4.  No rubs murmurs or bruit GI: Abdomen is soft.  Less tender in the epigastric area without any rebound rigidity or guarding.  No tenderness in the right upper quadrant.  No masses organomegaly.   Neurologic: Alert and oriented x3.  No focal neurological deficits.      Data Reviewed: I have personally reviewed following labs and imaging studies  CBC: Recent Labs  Lab 10/19/18 1208 10/20/18 0321 10/21/18 0314 10/22/18 0311 10/23/18 0536  WBC 8.0 9.4 9.3 8.4 7.1  NEUTROABS 5.1  --   --   --   --   HGB 12.0 9.8* 7.9* 8.2* 8.5*  HCT 38.1 31.1* 24.5* 25.3* 26.0*  MCV 98.2 98.1 97.6 94.4 94.9  PLT 178 160 142* 129* Q000111Q*   Basic Metabolic Panel: Recent Labs  Lab 10/21/18 0314 10/22/18 0311 10/23/18 0536 10/24/18 1351 10/25/18 0422  NA 131* 134* 138 138 137  K 4.6 4.4 4.8 4.6 5.1  CL 97* 102 106 104 102  CO2 26 26 24 26 29   GLUCOSE 119* 121* 106* 127* 107*  BUN 35* 27* 16 15 16   CREATININE 1.95* 1.31* 0.96 1.13* 1.03*  CALCIUM 8.5* 8.2* 8.5* 8.7* 8.6*     Recent Results (from the past 240 hour(s))  SARS Coronavirus 2 [facility] order, Performed in [facility] hospital lab) Nasopharyngeal Nasopharyngeal Swab     Status: None   Collection Time: 10/19/18 12:09 PM   Specimen: Nasopharyngeal Swab  Result Value Ref Range Status   SARS Coronavirus 2 NEGATIVE NEGATIVE Final    Comment: (NOTE) If result is NEGATIVE SARS-CoV-2 target nucleic acids are NOT DETECTED. The SARS-CoV-2 RNA is generally detectable in upper and lower  respiratory specimens during the acute phase of infection. The lowest  concentration of SARS-CoV-2 viral copies this assay can  detect is 250  copies / mL. A negative result does not preclude SARS-CoV-2 infection  and should not be used as the sole basis for treatment or other  patient management decisions.  A negative result may occur with  improper specimen collection / handling, submission of specimen other  than nasopharyngeal swab, presence of viral mutation(s) within the  areas targeted by this assay, and inadequate number of viral copies  (<250 copies / mL). A negative result must be combined with clinical  observations, patient history, and epidemiological information. If result is POSITIVE SARS-CoV-2 target nucleic acids are DETECTED. The SARS-CoV-2 RNA is generally detectable in upper and lower  respiratory specimens dur ing the acute phase of infection.  Positive  results are indicative of active infection with SARS-CoV-2.  Clinical  correlation with patient history and other diagnostic information is  necessary to determine patient infection status.  Positive results do  not rule out bacterial infection or co-infection with other viruses. If result is PRESUMPTIVE POSTIVE SARS-CoV-2 nucleic acids MAY BE  PRESENT.   A presumptive positive result was obtained on the submitted specimen  and confirmed on repeat testing.  While 2019 novel coronavirus  (SARS-CoV-2) nucleic acids may be present in the submitted sample  additional confirmatory testing may be necessary for epidemiological  and / or clinical management purposes  to differentiate between  SARS-CoV-2 and other Sarbecovirus currently known to infect humans.  If clinically indicated additional testing with an alternate test  methodology [id number]) is advised. The SARS-CoV-2 RNA is generally  detectable in upper and lower respiratory sp ecimens during the acute  phase of infection. The expected result is Negative. Fact Sheet for Patients:  StrictlyIdeas.no Fact Sheet for Healthcare Providers:  BankingDealers.co.za This test is not yet approved or cleared by the [place] FDA and has been authorized for detection and/or diagnosis of SARS-CoV-2 by FDA under an Emergency Use Authorization (EUA).  This EUA will remain in effect (meaning this test can be used) for the duration of the COVID-19 declaration under Section 564(b)(1) of the Act, 21 U.S.C. section 360bbb-3(b)(1), unless the authorization is terminated or revoked sooner. Performed at [facility], [place] [address], [place], [place] [address]          Radiology Studies: No results found.      Scheduled Meds: . acetaminophen  650 mg Oral Q6H  . amiodarone  100 mg Oral Daily  . apixaban  2.5 mg Oral BID  . calcium carbonate  625 mg Oral BID WC  . cholecalciferol  2,000 Units Oral BID  . docusate sodium  100 mg Oral BID  . ezetimibe  10 mg Oral Daily  . feeding supplement (ENSURE ENLIVE)  237 mL Oral BID BM  . levothyroxine  125 mcg Oral QAC breakfast  . metoprolol tartrate  12.5 mg Oral BID  . pantoprazole  40 mg Oral BID  . polyethylene glycol  17 g Oral BID  . vitamin C  500 mg Oral Daily   Continuous Infusions: . methocarbamol (ROBAXIN) IV       LOS: 6 days     Bonnielee Haff,  [facility]  To contact the attending provider between 7A-7P or the covering provider during after hours 7P-7A, please log into the web site [email or web address] and access using universal [place] password for that web site. If you do not have the password, please call the hospital operator.  10/25/2018, 10:26 AM

## 2018-10-25 NOTE — Plan of Care (Signed)
  Problem: Education: Goal: Verbalization of understanding the information provided (i.e., activity precautions, restrictions, etc) will improve Outcome: Progressing Goal: Individualized Educational Video(s) Outcome: Progressing   Problem: Activity: Goal: Ability to ambulate and perform ADLs will improve Outcome: Progressing   Problem: Activity: Goal: Risk for activity intolerance will decrease Outcome: Progressing

## 2018-10-25 NOTE — Plan of Care (Signed)
  Problem: Pain Managment: Goal: General experience of comfort will improve Outcome: Progressing   

## 2018-10-26 ENCOUNTER — Encounter (HOSPITAL_COMMUNITY): Payer: Self-pay | Admitting: *Deleted

## 2018-10-26 LAB — COMPREHENSIVE METABOLIC PANEL
ALT: 25 U/L (ref 0–44)
AST: 32 U/L (ref 15–41)
Albumin: 2.6 g/dL — ABNORMAL LOW (ref 3.5–5.0)
Alkaline Phosphatase: 54 U/L (ref 38–126)
Anion gap: 8 (ref 5–15)
BUN: 17 mg/dL (ref 8–23)
CO2: 27 mmol/L (ref 22–32)
Calcium: 8.7 mg/dL — ABNORMAL LOW (ref 8.9–10.3)
Chloride: 100 mmol/L (ref 98–111)
Creatinine, Ser: 1.16 mg/dL — ABNORMAL HIGH (ref 0.44–1.00)
GFR calc Af Amer: 49 mL/min — ABNORMAL LOW (ref >60)
GFR calc non Af Amer: 42 mL/min — ABNORMAL LOW (ref >60)
Glucose, Bld: 109 mg/dL — ABNORMAL HIGH (ref 70–99)
Potassium: 4.7 mmol/L (ref 3.5–5.1)
Sodium: 135 mmol/L (ref 135–145)
Total Bilirubin: 1.6 mg/dL — ABNORMAL HIGH (ref 0.3–1.2)
Total Protein: 5.1 g/dL — ABNORMAL LOW (ref 6.5–8.1)

## 2018-10-26 NOTE — Plan of Care (Signed)
  Problem: Pain Management: Goal: Pain level will decrease Outcome: Progressing   

## 2018-10-26 NOTE — Progress Notes (Signed)
PROGRESS NOTE   [name] [name]  [id number]    DOB: [birth year]    DOA: 10/19/2018  PCP: [name], MD    Brief Narrative:  85 year old female patient, lives alone independently, PMH of CAD s/p DES to LAD 12/20/2016, PAF on Eliquis, PPM, s/p TAVR [id number], chronic diastolic CHF, HTN, HLD, hypothyroid, GERD and CKD 3 admitted following mechanical fall leading to left hip fracture, s/p IM nailing/ORIF by orthopedics 8/16.     Assessment & Plan:  Left intertrochanteric femur/hip fracture, s/p ORIF (IM nailing) 10/19/2018  Orthopedics consulted and s/p IM nailing 8/16  Continue prior home dose of Eliquis which covers DVT prophylaxis  Outpatient orthopedic follow-up in 2 weeks for suture removal and further evaluation.  CIR was initially recommended.  However insurance has denied.  Pursuing skilled nursing facility for short-term rehab.  Osteoporosis likely contributing.  Recommend outpatient bone density evaluation within the next 4 weeks or initiation of a bisphosphonate.  Orthopedics not recommending initiation of bisphosphonate till fracture union has been achieved.  Patient remained stable from an orthopedic standpoint.  Continues to have muscle spasms mainly in the late evening time.  Orthostatic hypotension/Essential hypertension  Noted on 8/17 morning.  BP dropped from 124/78 sitting >96/45 standing.  Could have been due to meds (antihypertensives, opioids) and ABLA  Antihypertensives were held.  Metoprolol was subsequently reintroduced.  Continue to hold amlodipine and losartan.  Pressure remains reasonably well controlled for the most part.  Occasional high readings noted.  Continue to monitor.  GERD/gastritis/dysphagia Patient was complaining of burning sensation in the abdomen and difficulty swallowing mainly pills.  Diet was changed to soft.  She was given Maalox.  PPI dose was increased.  Symptoms have improved.  Abdomen remains benign.  LFTs and lipase levels are  normal.  She feels better.   Last endoscopy was in 2018 which did not show any abnormalities in the esophagus or the stomach.  Continue just with conservative treatment for now.  Patient too frail to undergo endoscopy at this time.  She should follow-up with Dr. Henrene Pastor with [facility] gastroenterology.  Postop acute blood loss anemia  Hemoglobin has been low but stable.  Thrombocytopenia  Mild.  Improved.  Paroxysmal A. fib/PPM  Rate controlled.  Continue amiodarone and metoprolol at reduced dose.  Eliquis held preoperatively and resumed 2.5 mg twice daily on POD #1 on 8/17  Hyperlipidemia  Continue Zetia.  Acute kidney injury complicating stage III CKD  Creatinine went up from 1.1 to 1.95, likely due to hemodynamics/hypotension from reasons indicated above.  Losartan was placed on hold.  Patient was given gentle IV fluids.  Right knee and has improved and seems to be at baseline.  She is making urine.  Continue to monitor urine output.    Hypothyroid  Continue Synthroid.  CAD/s/p TAVR/chronic diastolic CHF  Stable without angina and clinically euvolemic.  Continue to hold home dose of Lasix.  Hypoxia This is mild to begin with.  Possibly from atelectasis.  Now resolved.  Saturating normal on room air.  Continue incentive spirometry.      DVT prophylaxis: On Eliquis Code Status: DNR Family Communication: Discussed with patient. Disposition: Waiting on placement to skilled nursing facility.   Consultants:  Orthopedics Rehab MD, pending.  Procedures:  Left hip IM nailing on 8/16  Antimicrobials:  Perioperative antibiotics   Subjective: Patient states that she is feeling better.  However continues to have spasms mainly late in the evening.  No other complaints offered.  Able to  swallow better than before.  Burning sensation in the abdomen has improved.     Objective:  Vitals:   10/25/18 1657 10/25/18 2018 10/26/18 0506 10/26/18 0826  BP: (!) 152/58 101/63  135/62 (!) 144/55  Pulse: 64 75 67 60  Resp: 14 13  16   Temp: 98.3 F (36.8 C) 97.6 F (36.4 C) 98.1 F (36.7 C) 98.3 F (36.8 C)  TempSrc: Oral Oral Oral Oral  SpO2: 97% 97% 96% 96%  Weight:      Height:        Examination:   General appearance: Awake alert.  In no distress Resp: Clear to auscultation bilaterally.  Normal effort Cardio: S1-S2 is normal regular.  No S3-S4.  No rubs murmurs or bruit GI: Abdomen remains soft.  Nontender.  No masses organomegaly.  Bowel sounds present normal. Extremities: No swelling noted over the left lateral thigh Neurologic: Alert and oriented x3.  No focal neurological deficits.     Data Reviewed: I have personally reviewed following labs and imaging studies  CBC: Recent Labs  Lab 10/19/18 1208 10/20/18 0321 10/21/18 0314 10/22/18 0311 10/23/18 0536  WBC 8.0 9.4 9.3 8.4 7.1  NEUTROABS 5.1  --   --   --   --   HGB 12.0 9.8* 7.9* 8.2* 8.5*  HCT 38.1 31.1* 24.5* 25.3* 26.0*  MCV 98.2 98.1 97.6 94.4 94.9  PLT 178 160 142* 129* Q000111Q*   Basic Metabolic Panel: Recent Labs  Lab 10/22/18 0311 10/23/18 0536 10/24/18 1351 10/25/18 0422 10/26/18 0545  NA 134* 138 138 137 135  K 4.4 4.8 4.6 5.1 4.7  CL 102 106 104 102 100  CO2 26 24 26 29 27   GLUCOSE 121* 106* 127* 107* 109*  BUN 27* 16 15 16 17   CREATININE 1.31* 0.96 1.13* 1.03* 1.16*  CALCIUM 8.2* 8.5* 8.7* 8.6* 8.7*     Recent Results (from the past 240 hour(s))  SARS Coronavirus 2 [facility] order, Performed in [facility] hospital lab) Nasopharyngeal Nasopharyngeal Swab     Status: None   Collection Time: 10/19/18 12:09 PM   Specimen: Nasopharyngeal Swab  Result Value Ref Range Status   SARS Coronavirus 2 NEGATIVE NEGATIVE Final    Comment: (NOTE) If result is NEGATIVE SARS-CoV-2 target nucleic acids are NOT DETECTED. The SARS-CoV-2 RNA is generally detectable in upper and lower  respiratory specimens during the acute phase of infection. The lowest  concentration of  SARS-CoV-2 viral copies this assay can detect is 250  copies / mL. A negative result does not preclude SARS-CoV-2 infection  and should not be used as the sole basis for treatment or other  patient management decisions.  A negative result may occur with  improper specimen collection / handling, submission of specimen other  than nasopharyngeal swab, presence of viral mutation(s) within the  areas targeted by this assay, and inadequate number of viral copies  (<250 copies / mL). A negative result must be combined with clinical  observations, patient history, and epidemiological information. If result is POSITIVE SARS-CoV-2 target nucleic acids are DETECTED. The SARS-CoV-2 RNA is generally detectable in upper and lower  respiratory specimens dur ing the acute phase of infection.  Positive  results are indicative of active infection with SARS-CoV-2.  Clinical  correlation with patient history and other diagnostic information is  necessary to determine patient infection status.  Positive results do  not rule out bacterial infection or co-infection with other viruses. If result is PRESUMPTIVE POSTIVE SARS-CoV-2 nucleic acids MAY BE  PRESENT.   A presumptive positive result was obtained on the submitted specimen  and confirmed on repeat testing.  While 2019 novel coronavirus  (SARS-CoV-2) nucleic acids may be present in the submitted sample  additional confirmatory testing may be necessary for epidemiological  and / or clinical management purposes  to differentiate between  SARS-CoV-2 and other Sarbecovirus currently known to infect humans.  If clinically indicated additional testing with an alternate test  methodology [id number]) is advised. The SARS-CoV-2 RNA is generally  detectable in upper and lower respiratory sp ecimens during the acute  phase of infection. The expected result is Negative. Fact Sheet for Patients:  StrictlyIdeas.no Fact Sheet for Healthcare  Providers: BankingDealers.co.za This test is not yet approved or cleared by the [place] FDA and has been authorized for detection and/or diagnosis of SARS-CoV-2 by FDA under an Emergency Use Authorization (EUA).  This EUA will remain in effect (meaning this test can be used) for the duration of the COVID-19 declaration under Section 564(b)(1) of the Act, 21 U.S.C. section 360bbb-3(b)(1), unless the authorization is terminated or revoked sooner. Performed at [facility], [place] [address], [place], [place] [address]          Radiology Studies: No results found.      Scheduled Meds: . acetaminophen  650 mg Oral Q6H  . amiodarone  100 mg Oral Daily  . apixaban  2.5 mg Oral BID  . calcium carbonate  625 mg Oral BID WC  . cholecalciferol  2,000 Units Oral BID  . docusate sodium  100 mg Oral BID  . ezetimibe  10 mg Oral Daily  . feeding supplement (ENSURE ENLIVE)  237 mL Oral BID BM  . levothyroxine  125 mcg Oral QAC breakfast  . metoprolol tartrate  12.5 mg Oral BID  . pantoprazole  40 mg Oral BID  . polyethylene glycol  17 g Oral BID  . vitamin C  500 mg Oral Daily   Continuous Infusions: . methocarbamol (ROBAXIN) IV       LOS: 7 days     Bonnielee Haff,  [facility]  To contact the attending provider between 7A-7P or the covering provider during after hours 7P-7A, please log into the web site [email or web address] and access using universal [place] password for that web site. If you do not have the password, please call the hospital operator.  10/26/2018, 10:19 AM

## 2018-10-26 NOTE — Progress Notes (Signed)
Notified [facility] BP 131/56.

## 2018-10-27 MED ORDER — ALUM & MAG HYDROXIDE-SIMETH 200-200-20 MG/5ML PO SUSP: 30.0000 mL | ORAL | 0 refills | Status: DC | PRN

## 2018-10-27 MED ORDER — POLYETHYLENE GLYCOL 3350 17 G PO PACK: 17.0000 g | PACK | Freq: Two times a day (BID) | ORAL | 0 refills | Status: DC

## 2018-10-27 NOTE — Care Management Important Message (Signed)
Important Message  Patient Details  Name: [name] [name] MRN: [id number] Date of Birth: Jul 15, 1929   Medicare Important Message Given:  Yes     Memory Argue 10/27/2018, 2:52 PM

## 2018-10-27 NOTE — Discharge Summary (Signed)
[facility]  Physician Discharge Summary   Patient ID: [name] [name] MRN: [id number] DOB/AGE: Sep 24, 1929 85 y.o.  Admit date: 10/19/2018 Discharge date: 10/27/2018  PCP: [name], MD  DISCHARGE DIAGNOSES:  Left hip fracture status post ORIF Orthostatic hypotension Severe gastritis, improved Dysphagia related to the gastritis, improved Postoperative acute blood loss anemia Paroxysmal atrial fibrillation Chronic kidney disease stage III Hypothyroidism History of coronary artery disease History of aortic stenosis status post TAVR Chronic diastolic CHF    RECOMMENDATIONS FOR OUTPATIENT FOLLOW UP: 1. CBC and basic metabolic panel every week 2. Consider sending her to Dr. Henrene Pastor with gastroenterology if dysphagia recurs.    Home Health: Patient going to skilled nursing facility Equipment/Devices: None  CODE STATUS: DNR  DISCHARGE CONDITION: fair  Diet recommendation: Soft diet, heart healthy  INITIAL HISTORY: 85 year old female patient, lives alone independently, PMH of CAD s/p DES to LAD 12/20/2016, PAF on Eliquis, PPM, s/p TAVR [id number], chronic diastolic CHF, HTN, HLD, hypothyroid, GERD and CKD 3 admitted following mechanical fall leading to left hip fracture, s/p IM nailing/ORIF by orthopedics 8/16.     Consultations:  Orthopedics  Procedures:  ORIF left hip 8/16   HOSPITAL COURSE:   Left intertrochanteric femur/hip fracture, s/p ORIF (IM nailing) 10/19/2018  Orthopedics consulted and patient underwent IM nailing 8/16  Continue prior home dose of Eliquis which covers DVT prophylaxis  Outpatient orthopedic follow-up in 2 weeks for suture removal and further evaluation.  CIR was initially recommended.  However insurance has denied.    Will go to skilled nursing facility  Osteoporosis likely contributing.  Recommend outpatient bone density evaluation within the next 4 weeks or initiation of a bisphosphonate.  Orthopedics not recommending  initiation of bisphosphonate till fracture union has been achieved.  Patient had a lot of muscle spasms around her operative site.  This is improved.  She does have some bruising in the left lower extremity.  Hemoglobin is stable.    Stable for discharge today.    Orthostatic hypotension/Essential hypertension  Noted on 8/17 morning.  BP dropped from 124/78 sitting >96/45 standing.  Could have been due to meds (antihypertensives, opioids) and ABLA  Antihypertensives were held.  Metoprolol was subsequently reintroduced.  Continue to hold amlodipine and losartan.  Pressure remains reasonably well controlled for the most part.  Occasional high readings noted.  Continue to monitor.  May need to reintroduce her antihypertensive depending on her blood pressure readings at the skilled nursing facility and her orthostatic hypotension.  GERD/gastritis/dysphagia Patient was complaining of burning sensation in the abdomen and difficulty swallowing mainly pills.  Diet was changed to soft.  She was given Maalox.  PPI dose was increased.  Symptoms have improved.  Abdomen remains benign.  LFTs and lipase levels are normal.  She feels better.   Last endoscopy was in 2018 which did not show any abnormalities in the esophagus or the stomach.  Continue just with conservative treatment for now.  Patient too frail to undergo endoscopy at this time.  She should follow-up with Dr. Henrene Pastor with [facility] gastroenterology.  Postop acute blood loss anemia  Hemoglobin has been low but stable.  Thrombocytopenia  Mild.  Improved.  Paroxysmal A. fib/PPM  Rate controlled.  Continue amiodarone and metoprolol at reduced dose.  Eliquis held preoperatively and resumed 2.5 mg twice daily on POD #1 on 8/17  Hyperlipidemia  Continue Zetia.  Acute kidney injury complicating stage III CKD  Creatinine went up from 1.1 to 1.95, likely due to hemodynamics/hypotension from  reasons indicated above.  Losartan was  placed on hold.  Patient was given gentle IV fluids.  Right knee and has improved and seems to be at baseline.  She is making urine.   Hypothyroid  Continue Synthroid.  CAD/s/p TAVR/chronic diastolic CHF  Stable without angina and clinically euvolemic.  Continue to hold home dose of Lasix.  Hypoxia This is mild to begin with.  Possibly from atelectasis.  Now resolved.  Saturating normal on room air.  Continue incentive spirometry.    Overall stable.  Okay for discharge to skilled nursing facility today.   PERTINENT LABS:  The results of significant diagnostics from this hospitalization (including imaging, microbiology, ancillary and laboratory) are listed below for reference.    Microbiology: Recent Results (from the past 240 hour(s))  SARS Coronavirus 2 [facility] order, Performed in [facility] hospital lab) Nasopharyngeal Nasopharyngeal Swab     Status: None   Collection Time: 10/19/18 12:09 PM   Specimen: Nasopharyngeal Swab  Result Value Ref Range Status   SARS Coronavirus 2 NEGATIVE NEGATIVE Final    Comment: (NOTE) If result is NEGATIVE SARS-CoV-2 target nucleic acids are NOT DETECTED. The SARS-CoV-2 RNA is generally detectable in upper and lower  respiratory specimens during the acute phase of infection. The lowest  concentration of SARS-CoV-2 viral copies this assay can detect is 250  copies / mL. A negative result does not preclude SARS-CoV-2 infection  and should not be used as the sole basis for treatment or other  patient management decisions.  A negative result may occur with  improper specimen collection / handling, submission of specimen other  than nasopharyngeal swab, presence of viral mutation(s) within the  areas targeted by this assay, and inadequate number of viral copies  (<250 copies / mL). A negative result must be combined with clinical  observations, patient history, and epidemiological information. If result is POSITIVE SARS-CoV-2 target  nucleic acids are DETECTED. The SARS-CoV-2 RNA is generally detectable in upper and lower  respiratory specimens dur ing the acute phase of infection.  Positive  results are indicative of active infection with SARS-CoV-2.  Clinical  correlation with patient history and other diagnostic information is  necessary to determine patient infection status.  Positive results do  not rule out bacterial infection or co-infection with other viruses. If result is PRESUMPTIVE POSTIVE SARS-CoV-2 nucleic acids MAY BE PRESENT.   A presumptive positive result was obtained on the submitted specimen  and confirmed on repeat testing.  While 2019 novel coronavirus  (SARS-CoV-2) nucleic acids may be present in the submitted sample  additional confirmatory testing may be necessary for epidemiological  and / or clinical management purposes  to differentiate between  SARS-CoV-2 and other Sarbecovirus currently known to infect humans.  If clinically indicated additional testing with an alternate test  methodology [phone]) is advised. The SARS-CoV-2 RNA is generally  detectable in upper and lower respiratory sp ecimens during the acute  phase of infection. The expected result is Negative. Fact Sheet for Patients:  StrictlyIdeas.no Fact Sheet for Healthcare Providers: BankingDealers.co.za This test is not yet approved or cleared by the [place] FDA and has been authorized for detection and/or diagnosis of SARS-CoV-2 by FDA under an Emergency Use Authorization (EUA).  This EUA will remain in effect (meaning this test can be used) for the duration of the COVID-19 declaration under Section 564(b)(1) of the Act, 21 U.S.C. section 360bbb-3(b)(1), unless the authorization is terminated or revoked sooner. Performed at [facility], [place]  [address] [place],  [address]      Labs: Basic Metabolic Panel: Recent Labs  Lab 10/22/18 0311 10/23/18 0536  10/24/18 1351 10/25/18 0422 10/26/18 0545  NA 134* 138 138 137 135  K 4.4 4.8 4.6 5.1 4.7  CL 102 106 104 102 100  CO2 26 24 26 29 27   GLUCOSE 121* 106* 127* 107* 109*  BUN 27* 16 15 16 17   CREATININE 1.31* 0.96 1.13* 1.03* 1.16*  CALCIUM 8.2* 8.5* 8.7* 8.6* 8.7*   Liver Function Tests: Recent Labs  Lab 10/24/18 1351 10/25/18 0422 10/26/18 0545  AST 29 29 32  ALT 17 21 25   ALKPHOS 54 51 54  BILITOT 1.9* 1.9* 1.6*  PROT 5.5* 4.9* 5.1*  ALBUMIN 2.9* 2.6* 2.6*   Recent Labs  Lab 10/24/18 1351  LIPASE 30   CBC: Recent Labs  Lab 10/21/18 0314 10/22/18 0311 10/23/18 0536  WBC 9.3 8.4 7.1  HGB 7.9* 8.2* 8.5*  HCT 24.5* 25.3* 26.0*  MCV 97.6 94.4 94.9  PLT 142* 129* 147*     IMAGING STUDIES Dg Chest 1 View  Result Date: 10/19/2018 CLINICAL DATA:  Fall EXAM: CHEST  1 VIEW COMPARISON:  01/29/2017 FINDINGS: Chronic cardiomegaly. Pacer leads from both sides with sacrifice on the right. Transcatheter aortic valve replacement. Chronic interstitial coarsening mild chronic lung disease/scarring on 2018 chest CT. Bronchial calcification likely contributes to the appearance. There is no edema, consolidation, effusion, or pneumothorax. No appreciable fracture. IMPRESSION: No acute finding. Electronically Signed   By: Monte Fantasia M.D.   On: 10/19/2018 14:04   Pelvis Portable  Result Date: 10/19/2018 CLINICAL DATA:  Femoral head fracture EXAM: PORTABLE PELVIS 1-2 VIEWS COMPARISON:  June 19, 2018 FINDINGS: The patient has undergone placement of an intramedullary nail through the left femur. The alignment appears stable. The hardware appears intact. There are expected postsurgical changes including subcutaneous gas and overlying soft tissue edema. There are degenerative changes of the right hip. IMPRESSION: Expected postsurgical changes related to left femur ORIF. Electronically Signed   By: Constance Holster M.D.   On: 10/19/2018 22:31   Dg C-arm 1-60 Min  Result Date:  10/19/2018 CLINICAL DATA:  ORIF of left femur fracture. EXAM: DG C-ARM 61-120 MIN COMPARISON:  Left hip radiographs 10/19/2018 FLUOROSCOPY TIME:  Fluoroscopy Time:  52 seconds FINDINGS: Seven intraoperative fluoroscopic images of the left femur are provided and demonstrate ORIF of the previously described intertrochanteric femur fracture. An intramedullary nail and proximal and distal screws have been placed, and alignment is improved. IMPRESSION: Intraoperative images during ORIF of left femur fracture. Electronically Signed   By: Logan Bores M.D.   On: 10/19/2018 20:59   Dg Hip Unilat W Or Wo Pelvis 2-3 Views Left  Result Date: 10/19/2018 CLINICAL DATA:  Fall with left hip pain EXAM: DG HIP (WITH OR WITHOUT PELVIS) 2-3V LEFT COMPARISON:  None. FINDINGS: Acute intertrochanteric left femur fracture with proximal retraction of the lesser trochanter. On the lateral view there is dorsal impaction. No hip dislocation. No evidence of pelvic ring fracture. IMPRESSION: Intertrochanteric left femur fracture. Electronically Signed   By: Monte Fantasia M.D.   On: 10/19/2018 14:02   Dg Femur Min 2 Views Left  Result Date: 10/19/2018 CLINICAL DATA:  Postop left femur fracture, ORIF intertrochanteric fracture. EXAM: LEFT FEMUR 2 VIEWS COMPARISON:  Preoperative radiographs earlier this day. FINDINGS: Intramedullary nail with trans trochanteric screw fixation of comminuted intertrochanteric femur fracture. Fractures in improved alignment compared to preoperative imaging. No periprosthetic lucency. Recent  postsurgical change includes air and edema in the soft tissues. IMPRESSION: Post ORIF of intertrochanteric femur fracture without immediate postoperative complication. Electronically Signed   By: Keith Rake M.D.   On: 10/19/2018 20:51   Dg Femur Port Min 2 Views Left  Result Date: 10/19/2018 CLINICAL DATA:  ORIF of the left femur EXAM: LEFT FEMUR PORTABLE 2 VIEWS COMPARISON:  October 19, 2018 FINDINGS: There are  expected postsurgical changes related to ORIF and placement of an IM nail through the left femur. There is subcutaneous gas and overlying soft tissue edema. Again noted is a fracture of the proximal left femur with stable osseous alignment. The hardware is intact. IMPRESSION: Status post ORIF of the left femur as detailed above. Electronically Signed   By: Constance Holster M.D.   On: 10/19/2018 22:35   Vas US Carotid  Result Date: 10/17/2018 Carotid Arterial Duplex Study Indications:       Patient complains of dizzy spells for the past month. Patient                    states the dizziness is not new, but has increased. She                    describes sudden onset dizziness, not associated with changes                    in position. The last episode was when she was sitting on the                    couch watching TV. She feels like she is going to "fade                    away." The episode lasts a few seconds to minutes. The                    episodes are brief, but then she feels poorly for the rest of                    the day. She feels like the skin on her head is too tight and                    hurts. Risk Factors:      Hypertension, hyperlipidemia, past history of smoking,                    coronary artery disease. Comparison Study:  In 12/2016, a cafotid duplex showed a RICA velocity of 33/9                    cm/s and a LICA velocity of [id number] cm/s. Performing Technologist: Wilkie Aye RVT  Examination Guidelines: A complete evaluation includes B-mode imaging, spectral Doppler, color Doppler, and power Doppler as needed of all accessible portions of each vessel. Bilateral testing is considered an integral part of a complete examination. Limited examinations for reoccurring indications may be performed as noted.  Right Carotid Findings: +----------+--------+--------+--------+------------+--------+             PSV cm/s EDV cm/s Stenosis Describe     Comments   +----------+--------+--------+--------+------------+--------+  CCA Prox   57       6                                        +----------+--------+--------+--------+------------+--------+  CCA Mid    56       8        <50%     heterogenous           +----------+--------+--------+--------+------------+--------+  CCA Distal 33       10                                       +----------+--------+--------+--------+------------+--------+  ICA Prox   32       11       Normal                          +----------+--------+--------+--------+------------+--------+  ICA Mid    41       11                                       +----------+--------+--------+--------+------------+--------+  ICA Distal 65       21                                       +----------+--------+--------+--------+------------+--------+  ECA        47       9                                        +----------+--------+--------+--------+------------+--------+ +----------+--------+-------+----------------+-------------------+             PSV cm/s EDV cms Describe         Arm Pressure (mmHG)  +----------+--------+-------+----------------+-------------------+  Subclavian 96               Multiphasic, WNL 130                  +----------+--------+-------+----------------+-------------------+ +---------+--------+--+--------+--+---------+  Vertebral PSV cm/s 46 EDV cm/s 10 Antegrade  +---------+--------+--+--------+--+---------+  Left Carotid Findings: +----------+--------+--------+--------+------------+--------+             PSV cm/s EDV cm/s Stenosis Describe     Comments  +----------+--------+--------+--------+------------+--------+  CCA Prox   66       11                                       +----------+--------+--------+--------+------------+--------+  CCA Mid    55       10       <50%     heterogenous           +----------+--------+--------+--------+------------+--------+  CCA Distal 44       12                                        +----------+--------+--------+--------+------------+--------+  ICA Prox   30       7        Normal                          +----------+--------+--------+--------+------------+--------+  ICA Mid    66  20                                       +----------+--------+--------+--------+------------+--------+  ICA Distal 71       16                                       +----------+--------+--------+--------+------------+--------+  ECA        59       18                heterogenous           +----------+--------+--------+--------+------------+--------+ +----------+--------+--------+----------------+-------------------+  Subclavian PSV cm/s EDV cm/s Describe         Arm Pressure (mmHG)  +----------+--------+--------+----------------+-------------------+             80                Multiphasic, WNL 136                  +----------+--------+--------+----------------+-------------------+ +---------+--------+--+--------+--+---------+  Vertebral PSV cm/s 53 EDV cm/s 12 Antegrade  +---------+--------+--+--------+--+---------+  Summary: Right Carotid: Non-hemodynamically significant plaque <50% noted in the CCA. The                extracranial vessels were near-normal with only minimal wall                thickening or plaque. Left Carotid: Non-hemodynamically significant plaque noted in the CCA. The               extracranial vessels were near-normal with only minimal wall               thickening or plaque. Vertebrals:  Bilateral vertebral arteries demonstrate antegrade flow. Subclavians: Normal flow hemodynamics were seen in bilateral subclavian              arteries. *See table(s) above for measurements and observations.  Electronically signed by Kathlyn Sacramento MD on 10/17/2018 at 5:16:20 PM.    Final     DISCHARGE EXAMINATION: Vitals:   10/26/18 1703 10/26/18 1939 10/26/18 2100 10/27/18 0357  BP: 130/64 (!) 99/56 (!) 131/58 (!) 143/51  Pulse: 68 70 68 70  Resp: 16 14  16   Temp: 98.5 F (36.9 C) 98.2 F (36.8  C)  98.3 F (36.8 C)  TempSrc: Oral Oral  Oral  SpO2: 96% 96%  97%  Weight:      Height:       General appearance: Awake alert.  In no distress Resp: Clear to auscultation bilaterally.  Normal effort Cardio: S1-S2 is normal regular.  No S3-S4.  No rubs murmurs or bruit GI: Abdomen is soft.  Nontender nondistended.  Bowel sounds are present normal.  No masses organomegaly Extremities: Bruising noted over the left thigh medially.  Neurologic: Alert and oriented x3.  No focal neurological deficits.    DISPOSITION: Skilled nursing facility  Discharge Instructions    Call MD for:  difficulty breathing, headache or visual disturbances   Complete by: As directed    Call MD for:  extreme fatigue   Complete by: As directed    Call MD for:  persistant dizziness or light-headedness   Complete by: As directed    Call MD for:  persistant nausea and vomiting   Complete by: As directed  Call MD for:  severe uncontrolled pain   Complete by: As directed    Call MD for:  temperature >100.4   Complete by: As directed    Discharge instructions   Complete by: As directed    Please review instructions on the discharge summary.  You were cared for by a hospitalist during your hospital stay. If you have any questions about your discharge medications or the care you received while you were in the hospital after you are discharged, you can call the unit and asked to speak with the hospitalist on call if the hospitalist that took care of you is not available. Once you are discharged, your primary care physician will handle any further medical issues. Please note that NO REFILLS for any discharge medications will be authorized once you are discharged, as it is imperative that you return to your primary care physician (or establish a relationship with a primary care physician if you do not have one) for your aftercare needs so that they can reassess your need for medications and monitor your lab values. If  you do not have a primary care physician, you can call [phone] for a physician referral.   Increase activity slowly   Complete by: As directed         Allergies as of 10/27/2018      Reactions   Penicillins Anaphylaxis, Other (See Comments)   Has patient had a PCN reaction causing immediate rash, facial/tongue/throat swelling, SOB or lightheadedness with hypotension: Yes Has patient had a PCN reaction causing severe rash involving mucus membranes or skin necrosis: No Has patient had a PCN reaction that required hospitalization: No Has patient had a PCN reaction occurring within the last 10 years: No If all of the above answers are "NO", then may proceed with Cephalosporin use.   Tetanus Toxoid Anaphylaxis   Demerol Other (See Comments)   Severe nausea   Codeine Nausea Only, Other (See Comments)   Severe nausea      Medication List    STOP taking these medications   amLODipine 5 MG tablet Commonly known as: NORVASC   furosemide 20 MG tablet Commonly known as: LASIX   losartan 25 MG tablet Commonly known as: COZAAR     TAKE these medications   acetaminophen 325 MG tablet Commonly known as: TYLENOL Take 2 tablets (650 mg total) by mouth every 6 (six) hours. What changed:   medication strength  how much to take  when to take this  reasons to take this   alum & mag hydroxide-simeth [id number] MG/5ML suspension Commonly known as: MAALOX/MYLANTA Take 30 mLs by mouth every 4 (four) hours as needed for indigestion or heartburn.   amiodarone 200 MG tablet Commonly known as: PACERONE Take 0.5 tablets (100 mg total) by mouth daily.   ascorbic acid 500 MG tablet Commonly known as: VITAMIN C Take 1 tablet (500 mg total) by mouth daily.   calcium carbonate 600 MG Tabs tablet Commonly known as: OS-CAL Take 600 mg by mouth 2 (two) times daily with a meal.   clindamycin 300 MG capsule Commonly known as: Cleocin Take 2 tablets by mouth one hour prior to any dental  appointments and stomach or bladder procedure.   dicyclomine 10 MG capsule Commonly known as: BENTYL Take 10 mg by mouth 2 (two) times daily as needed for spasms.   docusate sodium 100 MG capsule Commonly known as: COLACE Take 1 capsule (100 mg total) by mouth 2 (two) times daily.  Eliquis 2.5 MG Tabs tablet Generic drug: apixaban TAKE 1 TABLET(2.5 MG) BY MOUTH TWICE DAILY What changed: See the new instructions.   ezetimibe 10 MG tablet Commonly known as: ZETIA Take 1 tablet (10 mg total) by mouth daily.   feeding supplement (ENSURE ENLIVE) Liqd Take 237 mLs by mouth 2 (two) times daily between meals.   fluticasone 50 MCG/ACT nasal spray Commonly known as: FLONASE Place 2 sprays into both nostrils as needed. Sinus clog   HYDROcodone-acetaminophen 5-325 MG tablet Commonly known as: NORCO/VICODIN Take 1-2 tablets by mouth every 6 (six) hours as needed for moderate pain or severe pain.   loratadine 10 MG tablet Commonly known as: CLARITIN Take 10 mg by mouth daily as needed for allergies.   methocarbamol 500 MG tablet Commonly known as: ROBAXIN Take 1 tablet (500 mg total) by mouth every 8 (eight) hours as needed for muscle spasms.   metoprolol tartrate 25 MG tablet Commonly known as: LOPRESSOR Take 0.5 tablets (12.5 mg total) by mouth 2 (two) times daily. What changed: how much to take   nitroGLYCERIN 0.4 MG SL tablet Commonly known as: NITROSTAT Place 1 tablet (0.4 mg total) under the tongue every 5 (five) minutes as needed for chest pain.   pantoprazole 40 MG tablet Commonly known as: PROTONIX Take 1 tablet (40 mg total) by mouth 2 (two) times daily before a meal. What changed: when to take this   polyethylene glycol 17 g packet Commonly known as: MIRALAX / GLYCOLAX Take 17 g by mouth 2 (two) times daily.   polyvinyl alcohol 1.4 % ophthalmic solution Commonly known as: LIQUIFILM TEARS Place 1-2 drops into both eyes 3 (three) times daily as needed for dry  eyes.   PreserVision AREDS 2 Caps Take 1 capsule by mouth 2 (two) times daily.   sodium chloride 0.65 % Soln nasal spray Commonly known as: OCEAN Place 1 spray into both nostrils as needed for congestion.   Synthroid 125 MCG tablet Generic drug: levothyroxine Take 125 mcg by mouth daily before breakfast.   Vitamin D3 25 MCG tablet Commonly known as: Vitamin D Take 2 tablets (2,000 Units total) by mouth 2 (two) times daily.        Follow-up Information    Altamese Boulder, MD. Schedule an appointment as soon as possible for a visit in 2 week(s).   Specialty: Orthopedic Surgery Contact information: [id number] [phone]           TOTAL DISCHARGE TIME: 60 minutes  [facility] Pager on [email or web address]  10/27/2018, 10:42 AM

## 2018-10-27 NOTE — TOC Progression Note (Signed)
Transition of Care [facility]) - Progression Note    Patient Details  Name: [name] [name] MRN: [id number] Date of Birth: Apr 11, 1929  Transition of Care [facility]) CM/SW [place], [place] Phone Number: 10/27/2018, 10:43 AM  Clinical Narrative:     CSW heard back from [facility] with [facility]. We are still awaiting insurance authorization. The patient is medically stable and can discharge once insurance is received.   CSW will continue to follow.    Expected Discharge Plan: [place] Barriers to Discharge: Continued Medical Work up  Expected Discharge Plan and Services Expected Discharge Plan: [place] Choice: [place] arrangements for the past 2 months: Single Family Home Expected Discharge Date: 10/27/18                                     Social Determinants of Health (SDOH) Interventions    Readmission Risk Interventions No flowsheet data found.

## 2018-10-27 NOTE — Progress Notes (Signed)
Physical Therapy Treatment Patient Details Name: [name] [name] MRN: [id number] DOB: 08/14/1929 Today's Date: 10/27/2018    History of Present Illness Pt is an 85 y.o. female admitted 10/19/18 after fall when stepping off curb sustaining L intertrochanteric hip fx; s/p IM nailing 8/16. PMH includes pacemaker, CAD, HTN, CHF, CKD 3.    PT Comments    Pt performed gt training and functional mobility with min guard assistance overall.  She is progressing well and eager to d/c to rehab to make a full recovery before returning home.  Pt is slow and guarded.  Performed LLE supine exercises this session with AAROM.  Issued HEP post session for continued use and educated on frequency.  Pt continues to benefit from acute therapy during hospitalization.     Follow Up Recommendations  SNF;Supervision/Assistance - 24 hour     Equipment Recommendations  Rolling walker with 5" wheels;3in1 (PT)    Recommendations for Other Services       Precautions / Restrictions Precautions Precautions: Fall Precaution Comments: Watch BP Restrictions Weight Bearing Restrictions: Yes LLE Weight Bearing: Weight bearing as tolerated    Mobility  Bed Mobility               General bed mobility comments: Pt standing in room leaving bathroom with nurse tech.  Transfers Overall transfer level: Needs assistance Equipment used: Rolling walker (2 wheeled) Transfers: Sit to/from Stand Sit to Stand: Min guard         General transfer comment: Pt only performed stand to sit with min guard assistance and good eccentric loading.  Ambulation/Gait Ambulation/Gait assistance: Min guard Gait Distance (Feet): 180 Feet Assistive device: Rolling walker (2 wheeled) Gait Pattern/deviations: Step-through pattern;Decreased weight shift to left;Antalgic;Trunk flexed;Decreased stride length Gait velocity: Decreased   General Gait Details: Cues for upper trunk control and RW safety.   Stairs              Wheelchair Mobility    Modified Rankin (Stroke Patients Only)       Balance Overall balance assessment: Needs assistance Sitting-balance support: No upper extremity supported;Feet supported Sitting balance-Leahy Scale: Fair       Standing balance-Leahy Scale: Poor Standing balance comment: Reliant on UE support                            Cognition Arousal/Alertness: Awake/alert Behavior During Therapy: WFL for tasks assessed/performed Overall Cognitive Status: Within Functional Limits for tasks assessed                                        Exercises General Exercises - Lower Extremity Ankle Circles/Pumps: AROM;Both;20 reps;Supine Quad Sets: AROM;Left;10 reps;Supine Long Arc Quad: AROM;Left;10 reps;Seated Heel Slides: AROM;Left;10 reps;Supine;AAROM Hip ABduction/ADduction: AROM;Left;10 reps;Supine    General Comments        Pertinent Vitals/Pain Pain Assessment: Faces Pain Score: 5  Pain Location: LLE around thigh Pain Descriptors / Indicators: Discomfort;Grimacing;Guarding Pain Intervention(s): Monitored during session;Repositioned    Home Living                      Prior Function            PT Goals (current goals can now be found in the care plan section) Acute Rehab PT Goals Patient Stated Goal: To get out of here and go to rehab. Potential to Achieve  Goals: Good Progress towards PT goals: Progressing toward goals    Frequency    Min 3X/week      PT Plan Current plan remains appropriate    Co-evaluation              AM-PAC PT "6 Clicks" Mobility   Outcome Measure  Help needed turning from your back to your side while in a flat bed without using bedrails?: A Little Help needed moving from lying on your back to sitting on the side of a flat bed without using bedrails?: A Little Help needed moving to and from a bed to a chair (including a wheelchair)?: A Little Help needed standing up from a  chair using your arms (e.g., wheelchair or bedside chair)?: A Little Help needed to walk in hospital room?: A Little Help needed climbing 3-5 steps with a railing? : A Little 6 Click Score: 18    End of Session Equipment Utilized During Treatment: Gait belt Activity Tolerance: Patient tolerated treatment well Patient left: in chair;with call bell/phone within reach;with family/visitor present;with nursing/sitter in room Nurse Communication: Mobility status PT Visit Diagnosis: Other abnormalities of gait and mobility (R26.89);Pain Pain - Right/Left: Left Pain - part of body: Leg     Time: [id number] PT Time Calculation (min) (ACUTE ONLY): 19 min  Charges:  $Gait Training: 8-22 mins                     Governor Rooks, PTA Acute Rehabilitation Services Pager [phone] Office [phone]     Rayli Wiederhold Eli Hose 10/27/2018, 1:46 PM

## 2018-10-28 NOTE — Progress Notes (Signed)
Physical Therapy Treatment Patient Details Name: [name] [name] MRN: [id number] DOB: [birth year] Today's Date: 10/28/2018    History of Present Illness Pt is an 85 y.o. female admitted 10/19/18 after fall when stepping off curb sustaining L intertrochanteric hip fx; s/p IM nailing 8/16. PMH includes pacemaker, CAD, HTN, CHF, CKD 3.   PT Comments    Pt progressing well with mobility. Remains motivated to participate and progress towards return to independent PLOF. Reliant on UE support and external assist ambulating without device. Pt awaiting additional COVID test results prior to d/c to SNF. Will continue to follow acutely if pt to remain admitted.    Follow Up Recommendations  SNF;Supervision/Assistance - 24 hour     Equipment Recommendations  Rolling walker with 5" wheels;3in1 (PT)    Recommendations for Other Services       Precautions / Restrictions Precautions Precautions: Fall Restrictions Weight Bearing Restrictions: Yes LLE Weight Bearing: Weight bearing as tolerated    Mobility  Bed Mobility Overal bed mobility: Needs Assistance Bed Mobility: Supine to Sit     Supine to sit: Modified independent (Device/Increase time);HOB elevated Sit to supine: Min assist   General bed mobility comments: Mod indep to sit with heavy use of bed rail; minA for LLE management returning to supine with bed flat  Transfers Overall transfer level: Needs assistance Equipment used: Rolling walker (2 wheeled) Transfers: Sit to/from Stand Sit to Stand: Min guard         General transfer comment: Close min guard for balance standing from bed and BSC (over toilet) to RW; good awareness of hand placement  Ambulation/Gait Ambulation/Gait assistance: Min guard;Min assist;Mod assist Gait Distance (Feet): 150 Feet(+30) Assistive device: Rolling walker (2 wheeled);1 person hand held assist Gait Pattern/deviations: Step-through pattern;Decreased stride length;Decreased weight shift to  left;Trunk flexed Gait velocity: Decreased Gait velocity interpretation: <1.31 ft/sec, indicative of household ambulator General Gait Details: Slow, antalgic steps with RW, intermittent cues for upright posture and increased WBAT LLE. Additional gait training without RW, pt reliant on rail or single UE support and min-modA to maintain balance; reports pain worsened with this   Stairs             Wheelchair Mobility    Modified Rankin (Stroke Patients Only)       Balance Overall balance assessment: Needs assistance Sitting-balance support: No upper extremity supported;Feet supported Sitting balance-Leahy Scale: Fair       Standing balance-Leahy Scale: Poor Standing balance comment: Reliant on UE support                            Cognition Arousal/Alertness: Awake/alert Behavior During Therapy: WFL for tasks assessed/performed Overall Cognitive Status: Within Functional Limits for tasks assessed                                        Exercises      General Comments General comments (skin integrity, edema, etc.): Sister present and supportive      Pertinent Vitals/Pain Pain Assessment: Faces Faces Pain Scale: Hurts a little bit Pain Location: LLE Pain Descriptors / Indicators: Discomfort;Grimacing;Guarding Pain Intervention(s): Monitored during session    Home Living                      Prior Function            PT  Goals (current goals can now be found in the care plan section) Acute Rehab PT Goals Patient Stated Goal: Negative COVID test so she can go to rehab tomorrow PT Goal Formulation: With patient Time For Goal Achievement: 11/03/18 Potential to Achieve Goals: Good Progress towards PT goals: Progressing toward goals    Frequency    Min 3X/week      PT Plan Current plan remains appropriate    Co-evaluation              AM-PAC PT "6 Clicks" Mobility   Outcome Measure  Help needed turning  from your back to your side while in a flat bed without using bedrails?: A Little Help needed moving from lying on your back to sitting on the side of a flat bed without using bedrails?: A Little Help needed moving to and from a bed to a chair (including a wheelchair)?: A Little Help needed standing up from a chair using your arms (e.g., wheelchair or bedside chair)?: A Little Help needed to walk in hospital room?: A Little Help needed climbing 3-5 steps with a railing? : A Little 6 Click Score: 18    End of Session Equipment Utilized During Treatment: Gait belt Activity Tolerance: Patient tolerated treatment well Patient left: in bed;with call bell/phone within reach;with family/visitor present Nurse Communication: Mobility status PT Visit Diagnosis: Other abnormalities of gait and mobility (R26.89);Pain Pain - Right/Left: Left Pain - part of body: Leg     Time: [id number] PT Time Calculation (min) (ACUTE ONLY): 22 min  Charges:  $Gait Training: 8-22 mins                    Mabeline Caras, PT, DPT Acute Rehabilitation Services  Pager [phone] Office [place] 10/28/2018, 4:25 PM

## 2018-10-28 NOTE — TOC Progression Note (Addendum)
Transition of Care [facility]) - Progression Note    Patient Details  Name: [name] [name] MRN: [id number] Date of Birth: 1929-10-18  Transition of Care [facility]) CM/SW [place], LCSW Phone Number: 10/28/2018, 9:56 AM  Clinical Narrative:    10am-[facility] contacted CSW back and said that patient's COVID is too old and requires a new test. MD aware and will order test. Patient and sister updated, though they are disappointed to have to remain in the hospital.   9:56am-[facility] able to accept patient today. Insurance approval received. Patient updated.    Expected Discharge Plan: [name] Barriers to Discharge: No Barriers Identified  Expected Discharge Plan and Services Expected Discharge Plan: [place] Choice: [place] Living arrangements for the past 2 months: Single Family Home Expected Discharge Date: 10/27/18               DME Arranged: N/A DME Agency: NA       HH Arranged: NA HH Agency: NA         Social Determinants of Health (SDOH) Interventions    Readmission Risk Interventions No flowsheet data found.

## 2018-10-28 NOTE — Discharge Summary (Signed)
[facility]  Physician Discharge Summary   Patient ID: [name] [name] MRN: [id number] DOB/AGE: 09-Oct-1929 85 y.o.  Admit date: 10/19/2018 Discharge date: 10/28/2018  PCP: [name], MD  DISCHARGE DIAGNOSES:  Left hip fracture status post ORIF Orthostatic hypotension Severe gastritis, improved Dysphagia related to the gastritis, improved Postoperative acute blood loss anemia Paroxysmal atrial fibrillation Chronic kidney disease stage III Hypothyroidism History of coronary artery disease History of aortic stenosis status post TAVR Chronic diastolic CHF    RECOMMENDATIONS FOR OUTPATIENT FOLLOW UP: 1. CBC and basic metabolic panel every week 2. Consider sending her to Dr. Henrene Pastor with gastroenterology if dysphagia recurs.    Home Health: Patient going to skilled nursing facility Equipment/Devices: None  CODE STATUS: DNR  DISCHARGE CONDITION: fair  Diet recommendation: Soft diet, heart healthy  INITIAL HISTORY: 85 year old female patient, lives alone independently, PMH of CAD s/p DES to LAD 12/20/2016, PAF on Eliquis, PPM, s/p TAVR [id number], chronic diastolic CHF, HTN, HLD, hypothyroid, GERD and CKD 3 admitted following mechanical fall leading to left hip fracture, s/p IM nailing/ORIF by orthopedics 8/16.     Consultations:  Orthopedics  Procedures:  ORIF left hip 8/16   HOSPITAL COURSE:   Left intertrochanteric femur/hip fracture, s/p ORIF (IM nailing) 10/19/2018  Orthopedics consulted and patient underwent IM nailing 8/16  Continue prior home dose of Eliquis which covers DVT prophylaxis  Outpatient orthopedic follow-up in 2 weeks for suture removal and further evaluation.  CIR was initially recommended.  However insurance has denied.    Will go to skilled nursing facility  Osteoporosis likely contributing.  Recommend outpatient bone density evaluation within the next 4 weeks or initiation of a bisphosphonate.  Orthopedics not recommending  initiation of bisphosphonate till fracture union has been achieved.  Patient had a lot of muscle spasms around her operative site.  This is improved.  She does have some bruising in the left lower extremity.  Hemoglobin is stable.    Stable for discharge today.    Orthostatic hypotension/Essential hypertension  Noted on 8/17 morning.  BP dropped from 124/78 sitting >96/45 standing.  Could have been due to meds (antihypertensives, opioids) and ABLA  Antihypertensives were held.  Metoprolol was subsequently reintroduced.  Continue to hold amlodipine and losartan.  Pressure remains reasonably well controlled for the most part.  Occasional high readings noted.  Continue to monitor.  May need to reintroduce her antihypertensive depending on her blood pressure readings at the skilled nursing facility and her orthostatic hypotension.  GERD/gastritis/dysphagia Patient was complaining of burning sensation in the abdomen and difficulty swallowing mainly pills.  Diet was changed to soft.  She was given Maalox.  PPI dose was increased.  Symptoms have improved.  Abdomen remains benign.  LFTs and lipase levels are normal.  She feels better.   Last endoscopy was in 2018 which did not show any abnormalities in the esophagus or the stomach.  Continue just with conservative treatment for now.  Patient too frail to undergo endoscopy at this time.  She should follow-up with Dr. Henrene Pastor with [facility] gastroenterology.  Postop acute blood loss anemia  Hemoglobin has been low but stable.  Thrombocytopenia  Mild.  Improved.  Paroxysmal A. fib/PPM  Rate controlled.  Continue amiodarone and metoprolol at reduced dose.  Eliquis held preoperatively and resumed 2.5 mg twice daily on POD #1 on 8/17  Hyperlipidemia  Continue Zetia.  Acute kidney injury complicating stage III CKD  Creatinine went up from 1.1 to 1.95, likely due to hemodynamics/hypotension from  reasons indicated above.  Losartan was  placed on hold.  Patient was given gentle IV fluids.  Right knee and has improved and seems to be at baseline.  She is making urine.   Hypothyroid  Continue Synthroid.  CAD/s/p TAVR/chronic diastolic CHF  Stable without angina and clinically euvolemic.  Continue to hold home dose of Lasix.  Hypoxia This is mild to begin with.  Possibly from atelectasis.  Now resolved.  Saturating normal on room air.  Continue incentive spirometry.    Overall stable.  Okay for discharge to skilled nursing facility today.   PERTINENT LABS:  The results of significant diagnostics from this hospitalization (including imaging, microbiology, ancillary and laboratory) are listed below for reference.    Microbiology: Recent Results (from the past 240 hour(s))  SARS Coronavirus 2 [facility] order, Performed in [facility] hospital lab) Nasopharyngeal Nasopharyngeal Swab     Status: None   Collection Time: 10/19/18 12:09 PM   Specimen: Nasopharyngeal Swab  Result Value Ref Range Status   SARS Coronavirus 2 NEGATIVE NEGATIVE Final    Comment: (NOTE) If result is NEGATIVE SARS-CoV-2 target nucleic acids are NOT DETECTED. The SARS-CoV-2 RNA is generally detectable in upper and lower  respiratory specimens during the acute phase of infection. The lowest  concentration of SARS-CoV-2 viral copies this assay can detect is 250  copies / mL. A negative result does not preclude SARS-CoV-2 infection  and should not be used as the sole basis for treatment or other  patient management decisions.  A negative result may occur with  improper specimen collection / handling, submission of specimen other  than nasopharyngeal swab, presence of viral mutation(s) within the  areas targeted by this assay, and inadequate number of viral copies  (<250 copies / mL). A negative result must be combined with clinical  observations, patient history, and epidemiological information. If result is POSITIVE SARS-CoV-2 target  nucleic acids are DETECTED. The SARS-CoV-2 RNA is generally detectable in upper and lower  respiratory specimens dur ing the acute phase of infection.  Positive  results are indicative of active infection with SARS-CoV-2.  Clinical  correlation with patient history and other diagnostic information is  necessary to determine patient infection status.  Positive results do  not rule out bacterial infection or co-infection with other viruses. If result is PRESUMPTIVE POSTIVE SARS-CoV-2 nucleic acids MAY BE PRESENT.   A presumptive positive result was obtained on the submitted specimen  and confirmed on repeat testing.  While 2019 novel coronavirus  (SARS-CoV-2) nucleic acids may be present in the submitted sample  additional confirmatory testing may be necessary for epidemiological  and / or clinical management purposes  to differentiate between  SARS-CoV-2 and other Sarbecovirus currently known to infect humans.  If clinically indicated additional testing with an alternate test  methodology [phone]) is advised. The SARS-CoV-2 RNA is generally  detectable in upper and lower respiratory sp ecimens during the acute  phase of infection. The expected result is Negative. Fact Sheet for Patients:  StrictlyIdeas.no Fact Sheet for Healthcare Providers: BankingDealers.co.za This test is not yet approved or cleared by the [place] FDA and has been authorized for detection and/or diagnosis of SARS-CoV-2 by FDA under an Emergency Use Authorization (EUA).  This EUA will remain in effect (meaning this test can be used) for the duration of the COVID-19 declaration under Section 564(b)(1) of the Act, 21 U.S.C. section 360bbb-3(b)(1), unless the authorization is terminated or revoked sooner. Performed at [facility], [place]  [address] [place], [place] [address]      Labs: Basic Metabolic Panel: Recent Labs  Lab 10/22/18 0311 10/23/18 0536  10/24/18 1351 10/25/18 0422 10/26/18 0545  NA 134* 138 138 137 135  K 4.4 4.8 4.6 5.1 4.7  CL 102 106 104 102 100  CO2 26 24 26 29 27   GLUCOSE 121* 106* 127* 107* 109*  BUN 27* 16 15 16 17   CREATININE 1.31* 0.96 1.13* 1.03* 1.16*  CALCIUM 8.2* 8.5* 8.7* 8.6* 8.7*   Liver Function Tests: Recent Labs  Lab 10/24/18 1351 10/25/18 0422 10/26/18 0545  AST 29 29 32  ALT 17 21 25   ALKPHOS 54 51 54  BILITOT 1.9* 1.9* 1.6*  PROT 5.5* 4.9* 5.1*  ALBUMIN 2.9* 2.6* 2.6*   Recent Labs  Lab 10/24/18 1351  LIPASE 30   CBC: Recent Labs  Lab 10/22/18 0311 10/23/18 0536  WBC 8.4 7.1  HGB 8.2* 8.5*  HCT 25.3* 26.0*  MCV 94.4 94.9  PLT 129* 147*     IMAGING STUDIES Dg Chest 1 View  Result Date: 10/19/2018 CLINICAL DATA:  Fall EXAM: CHEST  1 VIEW COMPARISON:  01/29/2017 FINDINGS: Chronic cardiomegaly. Pacer leads from both sides with sacrifice on the right. Transcatheter aortic valve replacement. Chronic interstitial coarsening mild chronic lung disease/scarring on 2018 chest CT. Bronchial calcification likely contributes to the appearance. There is no edema, consolidation, effusion, or pneumothorax. No appreciable fracture. IMPRESSION: No acute finding. Electronically Signed   By: Monte Fantasia M.D.   On: 10/19/2018 14:04   Pelvis Portable  Result Date: 10/19/2018 CLINICAL DATA:  Femoral head fracture EXAM: PORTABLE PELVIS 1-2 VIEWS COMPARISON:  June 19, 2018 FINDINGS: The patient has undergone placement of an intramedullary nail through the left femur. The alignment appears stable. The hardware appears intact. There are expected postsurgical changes including subcutaneous gas and overlying soft tissue edema. There are degenerative changes of the right hip. IMPRESSION: Expected postsurgical changes related to left femur ORIF. Electronically Signed   By: Constance Holster M.D.   On: 10/19/2018 22:31   Dg C-arm 1-60 Min  Result Date: 10/19/2018 CLINICAL DATA:  ORIF of left femur  fracture. EXAM: DG C-ARM 61-120 MIN COMPARISON:  Left hip radiographs 10/19/2018 FLUOROSCOPY TIME:  Fluoroscopy Time:  52 seconds FINDINGS: Seven intraoperative fluoroscopic images of the left femur are provided and demonstrate ORIF of the previously described intertrochanteric femur fracture. An intramedullary nail and proximal and distal screws have been placed, and alignment is improved. IMPRESSION: Intraoperative images during ORIF of left femur fracture. Electronically Signed   By: Logan Bores M.D.   On: 10/19/2018 20:59   Dg Hip Unilat W Or Wo Pelvis 2-3 Views Left  Result Date: 10/19/2018 CLINICAL DATA:  Fall with left hip pain EXAM: DG HIP (WITH OR WITHOUT PELVIS) 2-3V LEFT COMPARISON:  None. FINDINGS: Acute intertrochanteric left femur fracture with proximal retraction of the lesser trochanter. On the lateral view there is dorsal impaction. No hip dislocation. No evidence of pelvic ring fracture. IMPRESSION: Intertrochanteric left femur fracture. Electronically Signed   By: Monte Fantasia M.D.   On: 10/19/2018 14:02   Dg Femur Min 2 Views Left  Result Date: 10/19/2018 CLINICAL DATA:  Postop left femur fracture, ORIF intertrochanteric fracture. EXAM: LEFT FEMUR 2 VIEWS COMPARISON:  Preoperative radiographs earlier this day. FINDINGS: Intramedullary nail with trans trochanteric screw fixation of comminuted intertrochanteric femur fracture. Fractures in improved alignment compared to preoperative imaging. No periprosthetic lucency. Recent postsurgical change includes air and edema in  the soft tissues. IMPRESSION: Post ORIF of intertrochanteric femur fracture without immediate postoperative complication. Electronically Signed   By: Keith Rake M.D.   On: 10/19/2018 20:51   Dg Femur Port Min 2 Views Left  Result Date: 10/19/2018 CLINICAL DATA:  ORIF of the left femur EXAM: LEFT FEMUR PORTABLE 2 VIEWS COMPARISON:  October 19, 2018 FINDINGS: There are expected postsurgical changes related to ORIF  and placement of an IM nail through the left femur. There is subcutaneous gas and overlying soft tissue edema. Again noted is a fracture of the proximal left femur with stable osseous alignment. The hardware is intact. IMPRESSION: Status post ORIF of the left femur as detailed above. Electronically Signed   By: Constance Holster M.D.   On: 10/19/2018 22:35   Vas US Carotid  Result Date: 10/17/2018 Carotid Arterial Duplex Study Indications:       Patient complains of dizzy spells for the past month. Patient                    states the dizziness is not new, but has increased. She                    describes sudden onset dizziness, not associated with changes                    in position. The last episode was when she was sitting on the                    couch watching TV. She feels like she is going to "fade                    away." The episode lasts a few seconds to minutes. The                    episodes are brief, but then she feels poorly for the rest of                    the day. She feels like the skin on her head is too tight and                    hurts. Risk Factors:      Hypertension, hyperlipidemia, past history of smoking,                    coronary artery disease. Comparison Study:  In 12/2016, a cafotid duplex showed a RICA velocity of 33/9                    cm/s and a LICA velocity of [id number] cm/s. Performing Technologist: Wilkie Aye RVT  Examination Guidelines: A complete evaluation includes B-mode imaging, spectral Doppler, color Doppler, and power Doppler as needed of all accessible portions of each vessel. Bilateral testing is considered an integral part of a complete examination. Limited examinations for reoccurring indications may be performed as noted.  Right Carotid Findings: +----------+--------+--------+--------+------------+--------+             PSV cm/s EDV cm/s Stenosis Describe     Comments  +----------+--------+--------+--------+------------+--------+  CCA Prox   57       6                                         +----------+--------+--------+--------+------------+--------+  CCA Mid    56       8        <50%     heterogenous           +----------+--------+--------+--------+------------+--------+  CCA Distal 33       10                                       +----------+--------+--------+--------+------------+--------+  ICA Prox   32       11       Normal                          +----------+--------+--------+--------+------------+--------+  ICA Mid    41       11                                       +----------+--------+--------+--------+------------+--------+  ICA Distal 65       21                                       +----------+--------+--------+--------+------------+--------+  ECA        47       9                                        +----------+--------+--------+--------+------------+--------+ +----------+--------+-------+----------------+-------------------+             PSV cm/s EDV cms Describe         Arm Pressure (mmHG)  +----------+--------+-------+----------------+-------------------+  Subclavian 96               Multiphasic, WNL 130                  +----------+--------+-------+----------------+-------------------+ +---------+--------+--+--------+--+---------+  Vertebral PSV cm/s 46 EDV cm/s 10 Antegrade  +---------+--------+--+--------+--+---------+  Left Carotid Findings: +----------+--------+--------+--------+------------+--------+             PSV cm/s EDV cm/s Stenosis Describe     Comments  +----------+--------+--------+--------+------------+--------+  CCA Prox   66       11                                       +----------+--------+--------+--------+------------+--------+  CCA Mid    55       10       <50%     heterogenous           +----------+--------+--------+--------+------------+--------+  CCA Distal 44       12                                       +----------+--------+--------+--------+------------+--------+  ICA Prox   30       7        Normal                           +----------+--------+--------+--------+------------+--------+  ICA Mid    66  20                                       +----------+--------+--------+--------+------------+--------+  ICA Distal 71       16                                       +----------+--------+--------+--------+------------+--------+  ECA        59       18                heterogenous           +----------+--------+--------+--------+------------+--------+ +----------+--------+--------+----------------+-------------------+  Subclavian PSV cm/s EDV cm/s Describe         Arm Pressure (mmHG)  +----------+--------+--------+----------------+-------------------+             80                Multiphasic, WNL 136                  +----------+--------+--------+----------------+-------------------+ +---------+--------+--+--------+--+---------+  Vertebral PSV cm/s 53 EDV cm/s 12 Antegrade  +---------+--------+--+--------+--+---------+  Summary: Right Carotid: Non-hemodynamically significant plaque <50% noted in the CCA. The                extracranial vessels were near-normal with only minimal wall                thickening or plaque. Left Carotid: Non-hemodynamically significant plaque noted in the CCA. The               extracranial vessels were near-normal with only minimal wall               thickening or plaque. Vertebrals:  Bilateral vertebral arteries demonstrate antegrade flow. Subclavians: Normal flow hemodynamics were seen in bilateral subclavian              arteries. *See table(s) above for measurements and observations.  Electronically signed by Kathlyn Sacramento MD on 10/17/2018 at 5:16:20 PM.    Final     DISCHARGE EXAMINATION: Vitals:   10/27/18 2001 10/28/18 0442 10/28/18 0500 10/28/18 0900  BP: (!) 117/51 (!) 138/58  (!) 129/58  Pulse: 73 62  70  Resp: 14 14    Temp: 98 F (36.7 C) 98 F (36.7 C)  98 F (36.7 C)  TempSrc: Oral Oral  Oral  SpO2: 100% 98%  98%  Weight:   63 kg   Height:       General  appearance: Awake alert.  In no distress Resp: Clear to auscultation bilaterally.  Normal effort Cardio: S1-S2 is normal regular.  No S3-S4.  No rubs murmurs or bruit GI: Abdomen is soft.  Nontender nondistended.  Bowel sounds are present normal.  No masses organomegaly Extremities: Bruising noted over the left thigh. Neurologic: Alert and oriented x3.  No focal neurological deficits.    DISPOSITION: Skilled nursing facility  Discharge Instructions    Call MD for:  difficulty breathing, headache or visual disturbances   Complete by: As directed    Call MD for:  extreme fatigue   Complete by: As directed    Call MD for:  persistant dizziness or light-headedness   Complete by: As directed    Call MD for:  persistant nausea and vomiting   Complete by: As directed    Call  MD for:  severe uncontrolled pain   Complete by: As directed    Call MD for:  temperature >100.4   Complete by: As directed    Discharge instructions   Complete by: As directed    Please review instructions on the discharge summary.  You were cared for by a hospitalist during your hospital stay. If you have any questions about your discharge medications or the care you received while you were in the hospital after you are discharged, you can call the unit and asked to speak with the hospitalist on call if the hospitalist that took care of you is not available. Once you are discharged, your primary care physician will handle any further medical issues. Please note that NO REFILLS for any discharge medications will be authorized once you are discharged, as it is imperative that you return to your primary care physician (or establish a relationship with a primary care physician if you do not have one) for your aftercare needs so that they can reassess your need for medications and monitor your lab values. If you do not have a primary care physician, you can call [phone] for a physician referral.   Increase activity slowly    Complete by: As directed         Allergies as of 10/28/2018      Reactions   Penicillins Anaphylaxis, Other (See Comments)   Has patient had a PCN reaction causing immediate rash, facial/tongue/throat swelling, SOB or lightheadedness with hypotension: Yes Has patient had a PCN reaction causing severe rash involving mucus membranes or skin necrosis: No Has patient had a PCN reaction that required hospitalization: No Has patient had a PCN reaction occurring within the last 10 years: No If all of the above answers are "NO", then may proceed with Cephalosporin use.   Tetanus Toxoid Anaphylaxis   Demerol Other (See Comments)   Severe nausea   Codeine Nausea Only, Other (See Comments)   Severe nausea      Medication List    STOP taking these medications   amLODipine 5 MG tablet Commonly known as: NORVASC   furosemide 20 MG tablet Commonly known as: LASIX   losartan 25 MG tablet Commonly known as: COZAAR     TAKE these medications   acetaminophen 325 MG tablet Commonly known as: TYLENOL Take 2 tablets (650 mg total) by mouth every 6 (six) hours. What changed:   medication strength  how much to take  when to take this  reasons to take this   alum & mag hydroxide-simeth [id number] MG/5ML suspension Commonly known as: MAALOX/MYLANTA Take 30 mLs by mouth every 4 (four) hours as needed for indigestion or heartburn.   amiodarone 200 MG tablet Commonly known as: PACERONE Take 0.5 tablets (100 mg total) by mouth daily.   ascorbic acid 500 MG tablet Commonly known as: VITAMIN C Take 1 tablet (500 mg total) by mouth daily.   calcium carbonate 600 MG Tabs tablet Commonly known as: OS-CAL Take 600 mg by mouth 2 (two) times daily with a meal.   clindamycin 300 MG capsule Commonly known as: Cleocin Take 2 tablets by mouth one hour prior to any dental appointments and stomach or bladder procedure.   dicyclomine 10 MG capsule Commonly known as: BENTYL Take 10 mg by  mouth 2 (two) times daily as needed for spasms.   docusate sodium 100 MG capsule Commonly known as: COLACE Take 1 capsule (100 mg total) by mouth 2 (two) times daily.   Eliquis  2.5 MG Tabs tablet Generic drug: apixaban TAKE 1 TABLET(2.5 MG) BY MOUTH TWICE DAILY What changed: See the new instructions.   ezetimibe 10 MG tablet Commonly known as: ZETIA Take 1 tablet (10 mg total) by mouth daily.   feeding supplement (ENSURE ENLIVE) Liqd Take 237 mLs by mouth 2 (two) times daily between meals.   fluticasone 50 MCG/ACT nasal spray Commonly known as: FLONASE Place 2 sprays into both nostrils as needed. Sinus clog   HYDROcodone-acetaminophen 5-325 MG tablet Commonly known as: NORCO/VICODIN Take 1-2 tablets by mouth every 6 (six) hours as needed for moderate pain or severe pain.   loratadine 10 MG tablet Commonly known as: CLARITIN Take 10 mg by mouth daily as needed for allergies.   methocarbamol 500 MG tablet Commonly known as: ROBAXIN Take 1 tablet (500 mg total) by mouth every 8 (eight) hours as needed for muscle spasms.   metoprolol tartrate 25 MG tablet Commonly known as: LOPRESSOR Take 0.5 tablets (12.5 mg total) by mouth 2 (two) times daily. What changed: how much to take   nitroGLYCERIN 0.4 MG SL tablet Commonly known as: NITROSTAT Place 1 tablet (0.4 mg total) under the tongue every 5 (five) minutes as needed for chest pain.   pantoprazole 40 MG tablet Commonly known as: PROTONIX Take 1 tablet (40 mg total) by mouth 2 (two) times daily before a meal. What changed: when to take this   polyethylene glycol 17 g packet Commonly known as: MIRALAX / GLYCOLAX Take 17 g by mouth 2 (two) times daily.   polyvinyl alcohol 1.4 % ophthalmic solution Commonly known as: LIQUIFILM TEARS Place 1-2 drops into both eyes 3 (three) times daily as needed for dry eyes.   PreserVision AREDS 2 Caps Take 1 capsule by mouth 2 (two) times daily.   sodium chloride 0.65 % Soln nasal  spray Commonly known as: OCEAN Place 1 spray into both nostrils as needed for congestion.   Synthroid 125 MCG tablet Generic drug: levothyroxine Take 125 mcg by mouth daily before breakfast.   Vitamin D3 25 MCG tablet Commonly known as: Vitamin D Take 2 tablets (2,000 Units total) by mouth 2 (two) times daily.        Follow-up Information    Altamese , MD. Schedule an appointment as soon as possible for a visit in 2 week(s).   Specialty: Orthopedic Surgery Contact information: [place] [phone]           TOTAL DISCHARGE TIME: 3 minutes  [facility] Pager on [email or web address]  10/28/2018, 9:38 AM

## 2018-10-28 NOTE — TOC Transition Note (Addendum)
Transition of Care [facility]) - CM/SW Discharge Note   Patient Details  Name: [name] [name] MRN: [id number] Date of Birth: [birth year]  Transition of Care [facility]) CM/SW Contact:  Benard Halsted, LCSW Phone Number: 10/28/2018, 9:56 AM   Clinical Narrative:    Patient will DC to: [facility] Anticipated DC date: 10/28/18 Family notified: Sister at bedside Transport by:  Corey Harold next available   Per MD patient ready for DC to [facility]. RN, patient, patient's family, and facility notified of DC. Discharge Summary and FL2 sent to facility. RN to call report prior to discharge [phone] Room 119b). DC packet on chart. Ambulance transport requested for patient.   CSW will sign off for now as social work intervention is no longer needed. Please consult [place] again if new needs arise.  Cedric Fishman, LCSW Clinical Social Worker [phone]    Final next level of care: Skilled Nursing Facility Barriers to Discharge: No Barriers Identified   Patient Goals and CMS Choice Patient states their goals for this hospitalization and ongoing recovery are:: Rehab CMS Medicare.gov Compare Post Acute Care list provided to:: Patient Choice offered to / list presented to : Patient  Discharge Placement   Existing PASRR number confirmed : 10/28/18          Patient chooses bed at: [facility] Patient to be transferred to facility by: [place] Name of family member notified: Patient notified sister Patient and family notified of of transfer: 10/28/18  Discharge Plan and Services     Post Acute Care Choice: [facility]          DME Arranged: N/A DME Agency: NA       HH Arranged: NA HH Agency: NA        Social Determinants of Health (SDOH) Interventions     Readmission Risk Interventions No flowsheet data found.

## 2018-10-29 LAB — NOVEL CORONAVIRUS, NAA (HOSP ORDER, SEND-OUT TO REF LAB; TAT 18-24 HRS): SARS-CoV-2, NAA: NOT DETECTED

## 2018-10-29 NOTE — Progress Notes (Signed)
[facility]  Physician Discharge Summary   Patient ID: [name] [name] MRN: [id number] DOB/AGE: [birth year] 85 y.o.  Admit date: 10/19/2018 Discharge date: 10/29/2018  PCP: [name], MD  DISCHARGE DIAGNOSES:  Left hip fracture status post ORIF Orthostatic hypotension Severe gastritis, improved Dysphagia related to the gastritis, improved Postoperative acute blood loss anemia Paroxysmal atrial fibrillation Chronic kidney disease stage III Hypothyroidism History of coronary artery disease History of aortic stenosis status post TAVR Chronic diastolic CHF    RECOMMENDATIONS FOR OUTPATIENT FOLLOW UP: 1. CBC and basic metabolic panel every week 2. Consider sending her to Dr. Henrene Pastor with gastroenterology if dysphagia recurs.  CODE STATUS: DNR DISCHARGE CONDITION: fair Diet recommendation: Soft diet, heart healthy  INITIAL HISTORY: 85 year old female patient, lives alone independently, PMH of CAD s/p DES to LAD 12/20/2016, PAF on Eliquis, PPM, s/p TAVR [id number], chronic diastolic CHF, HTN, HLD, hypothyroid, GERD and CKD 3 admitted following mechanical fall leading to left hip fracture, s/p IM nailing/ORIF by orthopedics 8/16.  Repeat covid swab negative - stable for discharge as below.   Consultations:  Orthopedics  Procedures:  ORIF left hip 8/16   HOSPITAL COURSE:   Left intertrochanteric femur/hip fracture, s/p ORIF (IM nailing) 10/19/2018  Orthopedics consulted and patient underwent IM nailing 8/16  Continue prior home dose of Eliquis which covers DVT prophylaxis  Outpatient orthopedic follow-up in 2 weeks for suture removal and further evaluation.  CIR was initially recommended.  However insurance has denied.    Will go to skilled nursing facility  Osteoporosis likely contributing.  Recommend outpatient bone density evaluation within the next 4 weeks or initiation of a bisphosphonate.  Orthopedics not recommending initiation of bisphosphonate till  fracture union has been achieved.  Patient had a lot of muscle spasms around her operative site.  This is improved.  She does have some bruising in the left lower extremity.  Hemoglobin is stable.    Stable for discharge today.    Orthostatic hypotension/Essential hypertension  Noted on 8/17 morning.  BP dropped from 124/78 sitting >96/45 standing.  Could have been due to meds (antihypertensives, opioids) and ABLA  Antihypertensives were held.  Metoprolol was subsequently reintroduced.  Continue to hold amlodipine and losartan.  Pressure remains reasonably well controlled for the most part.  Occasional high readings noted.  Continue to monitor.  May need to reintroduce her antihypertensive depending on her blood pressure readings at the skilled nursing facility and her orthostatic hypotension.  GERD/gastritis/dysphagia Patient was complaining of burning sensation in the abdomen and difficulty swallowing mainly pills.  Diet was changed to soft.  She was given Maalox.  PPI dose was increased.  Symptoms have improved.  Abdomen remains benign.  LFTs and lipase levels are normal.  She feels better.   Last endoscopy was in 2018 which did not show any abnormalities in the esophagus or the stomach.  Continue just with conservative treatment for now.  Patient too frail to undergo endoscopy at this time.  She should follow-up with Dr. Henrene Pastor with [facility] gastro[facility]ology.  Postop acute blood loss anemia  Hemoglobin has been low but stable.  Thrombocytopenia  Mild.  Improved.  Paroxysmal A. fib/PPM  Rate controlled.  Continue amiodarone and metoprolol at reduced dose.  Eliquis held preoperatively and resumed 2.5 mg twice daily on POD #1 on 8/17  Hyperlipidemia  Continue Zetia.  Acute kidney injury complicating stage III CKD  Creatinine went up from 1.1 to 1.95, likely due to hemodynamics/hypotension from reasons indicated above.  Losartan was  placed on hold.  Patient was given  gentle IV fluids.  Right knee and has improved and seems to be at baseline.  She is making urine.   Hypothyroid  Continue Synthroid.  CAD/s/p TAVR/chronic diastolic CHF  Stable without angina and clinically euvolemic.  Continue to hold home dose of Lasix.  Hypoxia This is mild to begin with.  Possibly from atelectasis.  Now resolved.  Saturating normal on room air.  Continue incentive spirometry.    Overall stable.  Okay for discharge to skilled nursing facility today, repeat covid swab negative as of 8/26.   PERTINENT LABS:  The results of significant diagnostics from this hospitalization (including imaging, microbiology, ancillary and laboratory) are listed below for reference.    Microbiology: Recent Results (from the past 240 hour(s))  SARS Coronavirus 2 [facility] order, Performed in [facility] hospital lab) Nasopharyngeal Nasopharyngeal Swab     Status: None   Collection Time: 10/19/18 12:09 PM   Specimen: Nasopharyngeal Swab  Result Value Ref Range Status   SARS Coronavirus 2 NEGATIVE NEGATIVE Final    Comment: (NOTE) If result is NEGATIVE SARS-CoV-2 target nucleic acids are NOT DETECTED. The SARS-CoV-2 RNA is generally detectable in upper and lower  respiratory specimens during the acute phase of infection. The lowest  concentration of SARS-CoV-2 viral copies this assay can detect is 250  copies / mL. A negative result does not preclude SARS-CoV-2 infection  and should not be used as the sole basis for treatment or other  patient management decisions.  A negative result may occur with  improper specimen collection / handling, submission of specimen other  than nasopharyngeal swab, presence of viral mutation(s) within the  areas targeted by this assay, and inadequate number of viral copies  (<250 copies / mL). A negative result must be combined with clinical  observations, patient history, and epidemiological information. If result is POSITIVE SARS-CoV-2 target  nucleic acids are DETECTED. The SARS-CoV-2 RNA is generally detectable in upper and lower  respiratory specimens dur ing the acute phase of infection.  Positive  results are indicative of active infection with SARS-CoV-2.  Clinical  correlation with patient history and other diagnostic information is  necessary to determine patient infection status.  Positive results do  not rule out bacterial infection or co-infection with other viruses. If result is PRESUMPTIVE POSTIVE SARS-CoV-2 nucleic acids MAY BE PRESENT.   A presumptive positive result was obtained on the submitted specimen  and confirmed on repeat testing.  While 2019 novel coronavirus  (SARS-CoV-2) nucleic acids may be present in the submitted sample  additional confirmatory testing may be necessary for epidemiological  and / or clinical management purposes  to differentiate between  SARS-CoV-2 and other Sarbecovirus currently known to infect humans.  If clinically indicated additional testing with an alternate test  methodology [phone]) is advised. The SARS-CoV-2 RNA is generally  detectable in upper and lower respiratory sp ecimens during the acute  phase of infection. The expected result is Negative. Fact Sheet for Patients:  StrictlyIdeas.no Fact Sheet for Healthcare Providers: BankingDealers.co.za This test is not yet approved or cleared by the [place] FDA and has been authorized for detection and/or diagnosis of SARS-CoV-2 by FDA under an Emergency Use Authorization (EUA).  This EUA will remain in effect (meaning this test can be used) for the duration of the COVID-19 declaration under Section 564(b)(1) of the Act, 21 U.S.C. section 360bbb-3(b)(1), unless the authorization is terminated or revoked sooner. Performed at [facility], [address] [place], [place] [address]      Labs: Basic Metabolic Panel: Recent Labs  Lab 10/23/18 0536 10/24/18 1351  10/25/18 0422 10/26/18 0545  NA 138 138 137 135  K 4.8 4.6 5.1 4.7  CL 106 104 102 100  CO2 24 26 29 27   GLUCOSE 106* 127* 107* 109*  BUN 16 15 16 17   CREATININE 0.96 1.13* 1.03* 1.16*  CALCIUM 8.5* 8.7* 8.6* 8.7*   Liver Function Tests: Recent Labs  Lab 10/24/18 1351 10/25/18 0422 10/26/18 0545  AST 29 29 32  ALT 17 21 25   ALKPHOS 54 51 54  BILITOT 1.9* 1.9* 1.6*  PROT 5.5* 4.9* 5.1*  ALBUMIN 2.9* 2.6* 2.6*   Recent Labs  Lab 10/24/18 1351  LIPASE 30   CBC: Recent Labs  Lab 10/23/18 0536  WBC 7.1  HGB 8.5*  HCT 26.0*  MCV 94.9  PLT 147*     IMAGING STUDIES Dg Chest 1 View  Result Date: 10/19/2018 CLINICAL DATA:  Fall EXAM: CHEST  1 VIEW COMPARISON:  01/29/2017 FINDINGS: Chronic cardiomegaly. Pacer leads from both sides with sacrifice on the right. Transcatheter aortic valve replacement. Chronic interstitial coarsening mild chronic lung disease/scarring on 2018 chest CT. Bronchial calcification likely contributes to the appearance. There is no edema, consolidation, effusion, or pneumothorax. No appreciable fracture. IMPRESSION: No acute finding. Electronically Signed   By: Monte Fantasia M.D.   On: 10/19/2018 14:04   Pelvis Portable  Result Date: 10/19/2018 CLINICAL DATA:  Femoral head fracture EXAM: PORTABLE PELVIS 1-2 VIEWS COMPARISON:  June 19, 2018 FINDINGS: The patient has undergone placement of an intramedullary nail through the left femur. The alignment appears stable. The hardware appears intact. There are expected postsurgical changes including subcutaneous gas and overlying soft tissue edema. There are degenerative changes of the right hip. IMPRESSION: Expected postsurgical changes related to left femur ORIF. Electronically Signed   By: Constance Holster M.D.   On: 10/19/2018 22:31   Dg C-arm 1-60 Min  Result Date: 10/19/2018 CLINICAL DATA:  ORIF of left femur fracture. EXAM: DG C-ARM 61-120 MIN COMPARISON:  Left hip radiographs 10/19/2018 FLUOROSCOPY  TIME:  Fluoroscopy Time:  52 seconds FINDINGS: Seven intraoperative fluoroscopic images of the left femur are provided and demonstrate ORIF of the previously described intertrochanteric femur fracture. An intramedullary nail and proximal and distal screws have been placed, and alignment is improved. IMPRESSION: Intraoperative images during ORIF of left femur fracture. Electronically Signed   By: Logan Bores M.D.   On: 10/19/2018 20:59   Dg Hip Unilat W Or Wo Pelvis 2-3 Views Left  Result Date: 10/19/2018 CLINICAL DATA:  Fall with left hip pain EXAM: DG HIP (WITH OR WITHOUT PELVIS) 2-3V LEFT COMPARISON:  None. FINDINGS: Acute intertrochanteric left femur fracture with proximal retraction of the lesser trochanter. On the lateral view there is dorsal impaction. No hip dislocation. No evidence of pelvic ring fracture. IMPRESSION: Intertrochanteric left femur fracture. Electronically Signed   By: Monte Fantasia M.D.   On: 10/19/2018 14:02   Dg Femur Min 2 Views Left  Result Date: 10/19/2018 CLINICAL DATA:  Postop left femur fracture, ORIF intertrochanteric fracture. EXAM: LEFT FEMUR 2 VIEWS COMPARISON:  Preoperative radiographs earlier this day. FINDINGS: Intramedullary nail with trans trochanteric screw fixation of comminuted intertrochanteric femur fracture. Fractures in improved alignment compared to preoperative imaging. No periprosthetic lucency. Recent postsurgical change includes air and edema in the soft tissues. IMPRESSION: Post ORIF of intertrochanteric femur fracture without immediate postoperative complication. Electronically Signed  By: Keith Rake M.D.   On: 10/19/2018 20:51   Dg Femur Port Min 2 Views Left  Result Date: 10/19/2018 CLINICAL DATA:  ORIF of the left femur EXAM: LEFT FEMUR PORTABLE 2 VIEWS COMPARISON:  October 19, 2018 FINDINGS: There are expected postsurgical changes related to ORIF and placement of an IM nail through the left femur. There is subcutaneous gas and overlying  soft tissue edema. Again noted is a fracture of the proximal left femur with stable osseous alignment. The hardware is intact. IMPRESSION: Status post ORIF of the left femur as detailed above. Electronically Signed   By: Constance Holster M.D.   On: 10/19/2018 22:35   Vas US Carotid  Result Date: 10/17/2018 Carotid Arterial Duplex Study Indications:       Patient complains of dizzy spells for the past month. Patient                    states the dizziness is not new, but has increased. She                    describes sudden onset dizziness, not associated with changes                    in position. The last episode was when she was sitting on the                    couch watching TV. She feels like she is going to "fade                    away." The episode lasts a few seconds to minutes. The                    episodes are brief, but then she feels poorly for the rest of                    the day. She feels like the skin on her head is too tight and                    hurts. Risk Factors:      Hypertension, hyperlipidemia, past history of smoking,                    coronary artery disease. Comparison Study:  In 12/2016, a cafotid duplex showed a RICA velocity of 33/9                    cm/s and a LICA velocity of [id number] cm/s. Performing Technologist: Wilkie Aye RVT  Examination Guidelines: A complete evaluation includes B-mode imaging, spectral Doppler, color Doppler, and power Doppler as needed of all accessible portions of each vessel. Bilateral testing is considered an integral part of a complete examination. Limited examinations for reoccurring indications may be performed as noted.  Right Carotid Findings: +----------+--------+--------+--------+------------+--------+             PSV cm/s EDV cm/s Stenosis Describe     Comments  +----------+--------+--------+--------+------------+--------+  CCA Prox   57       6                                         +----------+--------+--------+--------+------------+--------+  CCA Mid    56       8        <  50%     heterogenous           +----------+--------+--------+--------+------------+--------+  CCA Distal 33       10                                       +----------+--------+--------+--------+------------+--------+  ICA Prox   32       11       Normal                          +----------+--------+--------+--------+------------+--------+  ICA Mid    41       11                                       +----------+--------+--------+--------+------------+--------+  ICA Distal 65       21                                       +----------+--------+--------+--------+------------+--------+  ECA        47       9                                        +----------+--------+--------+--------+------------+--------+ +----------+--------+-------+----------------+-------------------+             PSV cm/s EDV cms Describe         Arm Pressure (mmHG)  +----------+--------+-------+----------------+-------------------+  Subclavian 96               Multiphasic, WNL 130                  +----------+--------+-------+----------------+-------------------+ +---------+--------+--+--------+--+---------+  Vertebral PSV cm/s 46 EDV cm/s 10 Antegrade  +---------+--------+--+--------+--+---------+  Left Carotid Findings: +----------+--------+--------+--------+------------+--------+             PSV cm/s EDV cm/s Stenosis Describe     Comments  +----------+--------+--------+--------+------------+--------+  CCA Prox   66       11                                       +----------+--------+--------+--------+------------+--------+  CCA Mid    55       10       <50%     heterogenous           +----------+--------+--------+--------+------------+--------+  CCA Distal 44       12                                       +----------+--------+--------+--------+------------+--------+  ICA Prox   30       7        Normal                           +----------+--------+--------+--------+------------+--------+  ICA Mid    66       20                                       +----------+--------+--------+--------+------------+--------+  ICA Distal 71       16                                       +----------+--------+--------+--------+------------+--------+  ECA        59       18                heterogenous           +----------+--------+--------+--------+------------+--------+ +----------+--------+--------+----------------+-------------------+  Subclavian PSV cm/s EDV cm/s Describe         Arm Pressure (mmHG)  +----------+--------+--------+----------------+-------------------+             80                Multiphasic, WNL 136                  +----------+--------+--------+----------------+-------------------+ +---------+--------+--+--------+--+---------+  Vertebral PSV cm/s 53 EDV cm/s 12 Antegrade  +---------+--------+--+--------+--+---------+  Summary: Right Carotid: Non-hemodynamically significant plaque <50% noted in the CCA. The                extracranial vessels were near-normal with only minimal wall                thickening or plaque. Left Carotid: Non-hemodynamically significant plaque noted in the CCA. The               extracranial vessels were near-normal with only minimal wall               thickening or plaque. Vertebrals:  Bilateral vertebral arteries demonstrate antegrade flow. Subclavians: Normal flow hemodynamics were seen in bilateral subclavian              arteries. *See table(s) above for measurements and observations.  Electronically signed by Kathlyn Sacramento MD on 10/17/2018 at 5:16:20 PM.    Final     DISCHARGE EXAMINATION: Vitals:   10/28/18 1948 10/29/18 0500 10/29/18 0544 10/29/18 0809  BP: (!) 128/57  (!) 145/52 (!) 155/64  Pulse: 69  68 65  Resp: 16  14 16   Temp: 98.1 F (36.7 C)  97.9 F (36.6 C) 98.1 F (36.7 C)  TempSrc: Oral  Oral Oral  SpO2: 97%  98% 95%  Weight:  60.6 kg    Height:       General appearance:  Awake alert.  In no distress Resp: Clear to auscultation bilaterally.  Normal effort Cardio: S1-S2 is normal regular.  No S3-S4.  No rubs murmurs or bruit GI: Abdomen is soft.  Nontender nondistended.  Bowel sounds are present normal.  No masses organomegaly Extremities: Bruising noted over the left thigh. Neurologic: Alert and oriented x3.  No focal neurological deficits.    DISPOSITION: Skilled nursing facility  Discharge Instructions    Call MD for:  difficulty breathing, headache or visual disturbances   Complete by: As directed    Call MD for:  extreme fatigue   Complete by: As directed    Call MD for:  persistant dizziness or light-headedness   Complete by: As directed    Call MD for:  persistant nausea and vomiting   Complete by: As directed    Call MD for:  severe uncontrolled pain   Complete by: As directed    Call MD for:  temperature >100.4   Complete by: As directed    Discharge instructions   Complete by: As directed  Please review instructions on the discharge summary.  You were cared for by a hospitalist during your hospital stay. If you have any questions about your discharge medications or the care you received while you were in the hospital after you are discharged, you can call the unit and asked to speak with the hospitalist on call if the hospitalist that took care of you is not available. Once you are discharged, your primary care physician will handle any further medical issues. Please note that NO REFILLS for any discharge medications will be authorized once you are discharged, as it is imperative that you return to your primary care physician (or establish a relationship with a primary care physician if you do not have one) for your aftercare needs so that they can reassess your need for medications and monitor your lab values. If you do not have a primary care physician, you can call [phone] for a physician referral.   Increase activity slowly   Complete by:  As directed         Allergies as of 10/29/2018      Reactions   Penicillins Anaphylaxis, Other (See Comments)   Has patient had a PCN reaction causing immediate rash, facial/tongue/throat swelling, SOB or lightheadedness with hypotension: Yes Has patient had a PCN reaction causing severe rash involving mucus membranes or skin necrosis: No Has patient had a PCN reaction that required hospitalization: No Has patient had a PCN reaction occurring within the last 10 years: No If all of the above answers are "NO", then may proceed with Cephalosporin use.   Tetanus Toxoid Anaphylaxis   Demerol Other (See Comments)   Severe nausea   Codeine Nausea Only, Other (See Comments)   Severe nausea      Medication List    STOP taking these medications   amLODipine 5 MG tablet Commonly known as: NORVASC   furosemide 20 MG tablet Commonly known as: LASIX   losartan 25 MG tablet Commonly known as: COZAAR     TAKE these medications   acetaminophen 325 MG tablet Commonly known as: TYLENOL Take 2 tablets (650 mg total) by mouth every 6 (six) hours. What changed:   medication strength  how much to take  when to take this  reasons to take this   alum & mag hydroxide-simeth [id number] MG/5ML suspension Commonly known as: MAALOX/MYLANTA Take 30 mLs by mouth every 4 (four) hours as needed for indigestion or heartburn.   amiodarone 200 MG tablet Commonly known as: PACERONE Take 0.5 tablets (100 mg total) by mouth daily.   ascorbic acid 500 MG tablet Commonly known as: VITAMIN C Take 1 tablet (500 mg total) by mouth daily.   calcium carbonate 600 MG Tabs tablet Commonly known as: OS-CAL Take 600 mg by mouth 2 (two) times daily with a meal.   clindamycin 300 MG capsule Commonly known as: Cleocin Take 2 tablets by mouth one hour prior to any dental appointments and stomach or bladder procedure.   dicyclomine 10 MG capsule Commonly known as: BENTYL Take 10 mg by mouth 2 (two)  times daily as needed for spasms.   docusate sodium 100 MG capsule Commonly known as: COLACE Take 1 capsule (100 mg total) by mouth 2 (two) times daily.   Eliquis 2.5 MG Tabs tablet Generic drug: apixaban TAKE 1 TABLET(2.5 MG) BY MOUTH TWICE DAILY What changed: See the new instructions.   ezetimibe 10 MG tablet Commonly known as: ZETIA Take 1 tablet (10 mg total) by mouth daily.  feeding supplement (ENSURE ENLIVE) Liqd Take 237 mLs by mouth 2 (two) times daily between meals.   fluticasone 50 MCG/ACT nasal spray Commonly known as: FLONASE Place 2 sprays into both nostrils as needed. Sinus clog   HYDROcodone-acetaminophen 5-325 MG tablet Commonly known as: NORCO/VICODIN Take 1-2 tablets by mouth every 6 (six) hours as needed for moderate pain or severe pain.   loratadine 10 MG tablet Commonly known as: CLARITIN Take 10 mg by mouth daily as needed for allergies.   methocarbamol 500 MG tablet Commonly known as: ROBAXIN Take 1 tablet (500 mg total) by mouth every 8 (eight) hours as needed for muscle spasms.   metoprolol tartrate 25 MG tablet Commonly known as: LOPRESSOR Take 0.5 tablets (12.5 mg total) by mouth 2 (two) times daily. What changed: how much to take   nitroGLYCERIN 0.4 MG SL tablet Commonly known as: NITROSTAT Place 1 tablet (0.4 mg total) under the tongue every 5 (five) minutes as needed for chest pain.   pantoprazole 40 MG tablet Commonly known as: PROTONIX Take 1 tablet (40 mg total) by mouth 2 (two) times daily before a meal. What changed: when to take this   polyethylene glycol 17 g packet Commonly known as: MIRALAX / GLYCOLAX Take 17 g by mouth 2 (two) times daily.   polyvinyl alcohol 1.4 % ophthalmic solution Commonly known as: LIQUIFILM TEARS Place 1-2 drops into both eyes 3 (three) times daily as needed for dry eyes.   PreserVision AREDS 2 Caps Take 1 capsule by mouth 2 (two) times daily.   sodium chloride 0.65 % Soln nasal spray Commonly  known as: OCEAN Place 1 spray into both nostrils as needed for congestion.   Synthroid 125 MCG tablet Generic drug: levothyroxine Take 125 mcg by mouth daily before breakfast.   Vitamin D3 25 MCG tablet Commonly known as: Vitamin D Take 2 tablets (2,000 Units total) by mouth 2 (two) times daily.         Contact information for follow-up providers    Altamese Lafayette, MD. Schedule an appointment as soon as possible for a visit in 2 week(s).   Specialty: Orthopedic Surgery Contact information: [place] [address] [phone]            Contact information for after-discharge care    Destination    [facility] Preferred SNF .   Service: Skilled Nursing Contact information: [address] [place] [phone]                  TOTAL DISCHARGE TIME: 81 minutes  Little Ishikawa  [facility] Pager on [email or web address]  10/29/2018, 11:11 AM

## 2018-10-29 NOTE — Progress Notes (Signed)
Pt is ambulating with the walker. Left hip dressing dry and intact, with bruising to left hip and thigh. Pt is anxious to go to rehab. Report was given to [name] at [employer]. Discharged pt via [place].

## 2018-10-29 NOTE — TOC Progression Note (Signed)
Transition of Care [facility]) - Progression Note    Patient Details  Name: [name] [name] MRN: [id number] Date of Birth: 02-17-1930  Transition of Care [facility]) CM/SW [place], LCSW Phone Number: 10/29/2018, 8:32 AM  Clinical Narrative:    CSW still awaiting COVID results.    Expected Discharge Plan: [place] Barriers to Discharge: No Barriers Identified  Expected Discharge Plan and Services Expected Discharge Plan: [place] Choice: [place] Living arrangements for the past 2 months: Single Family Home Expected Discharge Date: 10/27/18               DME Arranged: N/A DME Agency: NA       HH Arranged: NA HH Agency: NA         Social Determinants of Health (SDOH) Interventions    Readmission Risk Interventions No flowsheet data found.

## 2018-12-04 ENCOUNTER — Other Ambulatory Visit: Payer: Self-pay | Admitting: Orthopedic Surgery

## 2018-12-04 DIAGNOSIS — S72142D Displaced intertrochanteric fracture of left femur, subsequent encounter for closed fracture with routine healing: Secondary | ICD-10-CM

## 2018-12-04 NOTE — Progress Notes (Signed)
Electrophysiology Office Note Date: 12/05/2018  ID:  Bahati, Zettel 12-24-1929, MRN [id number]  PCP: [name], MD Primary Cardiologist: Peter Martinique, MD Electrophysiologist: Dr. [name]  CC: Pacemaker follow-up  [name] [name] is a 85 y.o. female seen today for Dr. [name]. she presents today for routine electrophysiology followup.  Since last being seen in our clinic, the patient reports well overall.  She was admitted from 8/16 - 10/28/18 with L hip fracture. She is progress "great" with physical therapy.  she denies chest pain, palpitations, dyspnea, PND, orthopnea, nausea, vomiting, dizziness, syncope, edema, weight gain, or early satiety.  Device History: Medtronic Dual Chamber PPM implanted 10/2016 for SSS/symptomatic bradycardia  Past Medical History:  Diagnosis Date  . CKD (chronic kidney disease)   . Complication of anesthesia   . Coronary artery disease    a. 12/20/2016: s/p DES to LAD   . Diverticulosis   . GERD (gastroesophageal reflux disease)   . Hemorrhoids   . Hyperlipidemia   . Hypertension   . Hypothyroidism   . Osteoarthritis   . Paroxysmal atrial fibrillation (HCC)    a. on [place] with Xarelto but switched to Eliquis after stenting.   Marland Kitchen PONV (postoperative nausea and vomiting)   . Presence of permanent cardiac pacemaker   . S/P TAVR (transcatheter aortic valve replacement) 01/29/2017   23 mm Edwards Sapien 3 transcatheter heart valve placed via percutaneous right transfemoral approach    Past Surgical History:  Procedure Laterality Date  . ABDOMINAL HYSTERECTOMY  1980  . BASAL CELL CARCINOMA EXCISION    . CARDIAC CATHETERIZATION  ~ 11/2016  . CATARACT EXTRACTION W/ INTRAOCULAR LENS  IMPLANT, BILATERAL Bilateral   . CORONARY ANGIOPLASTY WITH STENT PLACEMENT  12/20/2016   "1 stent"  . CORONARY STENT INTERVENTION N/A 12/20/2016   Procedure: CORONARY STENT INTERVENTION;  Surgeon: Martinique, Peter M, MD;  Location: [place] [facility];  Service:  Cardiovascular;  Laterality: N/A;  . FINGER SURGERY Left    "fell; developed skiers thumb; had to operate on it"  . INSERT / REPLACE / [place]   for syptomatic bradycardia and syncope -- in [facility]  . INTRAMEDULLARY (IM) NAIL INTERTROCHANTERIC Left 10/19/2018   Procedure: INTRAMEDULLARY (IM) NAIL INTERTROCHANTRIC;  Surgeon: Altamese [place], MD;  Location: [place];  Service: Orthopedics;  Laterality: Left;  . LEAD REVISION/REPAIR N/A 10/09/2016   New left subclavian MDT Adapta L PPM dual chamber system implanted by Dr [name] with previously placed R subclavian system abandoned  . PACEMAKER GENERATOR CHANGE  2001   pulse generator replacement by Dr. Rollene Fare   . PACEMAKER GENERATOR CHANGE  04/01/2008   PPM Medtronic -- model # [id number] serial # [id number] -- pulse generator replacement by Dr. Verlon Setting   . RIGHT/LEFT HEART CATH AND CORONARY ANGIOGRAPHY N/A 11/27/2016   Procedure: RIGHT/LEFT HEART CATH AND CORONARY ANGIOGRAPHY;  Surgeon: Martinique, Peter M, MD;  Location: [place] [facility];  Service: Cardiovascular;  Laterality: N/A;  . SQUAMOUS CELL CARCINOMA EXCISION    . TEE WITHOUT CARDIOVERSION N/A 01/29/2017   Procedure: TRANSESOPHAGEAL ECHOCARDIOGRAM (TEE);  Surgeon: Sherren Mocha, MD;  Location: [place];  Service: Open Heart Surgery;  Laterality: N/A;  . TONSILLECTOMY    . TRANSCATHETER AORTIC VALVE REPLACEMENT, TRANSFEMORAL N/A 01/29/2017   Procedure: TRANSCATHETER AORTIC VALVE REPLACEMENT, TRANSFEMORAL;  Surgeon: Sherren Mocha, MD;  Location: [place];  Service: Open Heart Surgery;  Laterality: N/A;    Current Outpatient Medications  Medication Sig Dispense Refill  .  acetaminophen (TYLENOL) 325 MG tablet Take 2 tablets (650 mg total) by mouth every 6 (six) hours. 60 tablet 0  . alum & mag hydroxide-simeth (MAALOX/MYLANTA) 200-200-20 MG/5ML suspension Take 30 mLs by mouth every 4 (four) hours as needed for indigestion or heartburn. 355 mL 0  . amiodarone (PACERONE) 200 MG  tablet Take 0.5 tablets (100 mg total) by mouth daily. 45 tablet 3  . calcium carbonate (OS-CAL) 600 MG TABS Take 600 mg by mouth 2 (two) times daily with a meal.     . cholecalciferol (VITAMIN D) 25 MCG tablet Take 2 tablets (2,000 Units total) by mouth 2 (two) times daily. 60 tablet 2  . clindamycin (CLEOCIN) 300 MG capsule Take 2 tablets by mouth one hour prior to any dental appointments and stomach or bladder procedure. 2 capsule 4  . dicyclomine (BENTYL) 10 MG capsule Take 10 mg by mouth 2 (two) times daily as needed for spasms.   1  . docusate sodium (COLACE) 100 MG capsule Take 1 capsule (100 mg total) by mouth 2 (two) times daily. 10 capsule 0  . ELIQUIS 2.5 MG TABS tablet TAKE 1 TABLET(2.5 MG) BY MOUTH TWICE DAILY (Patient taking differently: Take 2.5 mg by mouth 2 (two) times daily. ) 180 tablet 2  . ezetimibe (ZETIA) 10 MG tablet Take 1 tablet (10 mg total) by mouth daily. 90 tablet 3  . feeding supplement, ENSURE ENLIVE, (ENSURE ENLIVE) LIQD Take 237 mLs by mouth 2 (two) times daily between meals. 237 mL 12  . fluticasone (FLONASE) 50 MCG/ACT nasal spray Place 2 sprays into both nostrils as needed. Sinus clog    . HYDROcodone-acetaminophen (NORCO/VICODIN) 5-325 MG tablet Take 1-2 tablets by mouth every 6 (six) hours as needed for moderate pain or severe pain. 50 tablet 0  . loratadine (CLARITIN) 10 MG tablet Take 10 mg by mouth daily as needed for allergies.    . methocarbamol (ROBAXIN) 500 MG tablet Take 1 tablet (500 mg total) by mouth every 8 (eight) hours as needed for muscle spasms. 30 tablet 0  . metoprolol tartrate (LOPRESSOR) 25 MG tablet Take 0.5 tablets (12.5 mg total) by mouth 2 (two) times daily. 270 tablet 1  . Multiple Vitamins-Minerals (PRESERVISION AREDS 2) CAPS Take 1 capsule by mouth 2 (two) times daily.    . nitroGLYCERIN (NITROSTAT) 0.4 MG SL tablet Place 1 tablet (0.4 mg total) under the tongue every 5 (five) minutes as needed for chest pain. 25 tablet 12  .  pantoprazole (PROTONIX) 40 MG tablet Take 1 tablet (40 mg total) by mouth 2 (two) times daily before a meal. 90 tablet 3  . polyethylene glycol (MIRALAX / GLYCOLAX) 17 g packet Take 17 g by mouth 2 (two) times daily. 14 each 0  . polyvinyl alcohol (LIQUIFILM TEARS) 1.4 % ophthalmic solution Place 1-2 drops into both eyes 3 (three) times daily as needed for dry eyes.    . sodium chloride (OCEAN) 0.65 % SOLN nasal spray Place 1 spray into both nostrils as needed for congestion.    Marland Kitchen SYNTHROID 125 MCG tablet Take 125 mcg by mouth daily before breakfast.     . vitamin C (VITAMIN C) 500 MG tablet Take 1 tablet (500 mg total) by mouth daily. 30 tablet 2   No current facility-administered medications for this visit.     Allergies:   Penicillins, Tetanus toxoid, Demerol, and Codeine   Social History: Social History   Socioeconomic History  . Marital status: Widowed    Spouse  name: Not on file  . Number of children: 0  . Years of education: Not on file  . Highest education level: Not on file  Occupational History    Employer: RETIRED  Social Needs  . Financial resource strain: Not on file  . Food insecurity    Worry: Not on file    Inability: Not on file  . Transportation needs    Medical: Not on file    Non-medical: Not on file  Tobacco Use  . Smoking status: Former Smoker    Packs/day: 1.50    Years: 30.00    Pack years: 45.00    Types: Cigarettes    Quit date: 03/05/1978    Years since quitting: 40.7  . Smokeless tobacco: Never Used  Substance and Sexual Activity  . Alcohol use: No  . Drug use: No  . Sexual activity: Not on file  Lifestyle  . Physical activity    Days per week: Not on file    Minutes per session: Not on file  . Stress: Not on file  Relationships  . Social Herbalist on phone: Not on file    Gets together: Not on file    Attends religious service: Not on file    Active member of club or organization: Not on file    Attends meetings of clubs or  organizations: Not on file    Relationship status: Not on file  . Intimate partner violence    Fear of current or ex partner: Not on file    Emotionally abused: Not on file    Physically abused: Not on file    Forced sexual activity: Not on file  Other Topics Concern  . Not on file  Social History Narrative  . Not on file    Family History: Family History  Problem Relation Age of Onset  . Cancer Father        lung cancer  . Alzheimer's disease Mother      Review of Systems: All other systems reviewed and are otherwise negative except as noted above.  Physical Exam: There were no vitals filed for this visit.   GEN- The patient is well appearing, alert and oriented x 3 today.   HEENT: normocephalic, atraumatic; sclera clear, conjunctiva pink; hearing intact; oropharynx clear; neck supple  Lungs- Clear to ausculation bilaterally, normal work of breathing.  No wheezes, rales, rhonchi Heart- Regular rate and rhythm, no murmurs, rubs or gallops  GI- soft, non-tender, non-distended, bowel sounds present  Extremities- no clubbing, cyanosis, or edema  MS- no significant deformity or atrophy Skin- warm and dry, no rash or lesion; PPM pocket well healed Psych- euthymic mood, full affect Neuro- strength and sensation are intact  PPM Interrogation- reviewed in detail today,  See PACEART report  EKG:  EKG is not ordered today. The ekg ordered 10/19/18 shows A paced rhythm at 83 bpm  Recent Labs: 02/05/2018: TSH 9.620 10/23/2018: Hemoglobin 8.5; Platelets 147 10/26/2018: ALT 25; BUN 17; Creatinine, Ser 1.16; Potassium 4.7; Sodium 135   Wt Readings from Last 3 Encounters:  10/29/18 133 lb 9.6 oz (60.6 kg)  10/03/18 125 lb 12.8 oz (57.1 kg)  08/08/18 122 lb (55.3 kg)     Other studies Reviewed: Additional studies/ records that were reviewed today include: Echo 01/2018 shows LVEF 60-65%, Previous EP office notes, Previous remote checks, Most recent labwork.   Assessment and Plan:   1.  Symptomatic bradycardia s/p Medtronic PPM  Normal PPM function See  Pace Art report No changes today  2. Persistent Afib She remains in sinus with amio 100 mg daily.  On Eliquis  3. S/p TAVR  4. HTN Stable  5. Fall with Hip fracture s/p IM nail 10/19/2018 She is recovering well.   Current medicines are reviewed at length with the patient today.   The patient does not have concerns regarding her medicines.  The following changes were made today:  none  Labs/ tests ordered today include:  No orders of the defined types were placed in this encounter.  Disposition:   Follow up with EP APP in 12 months.    Jacalyn Lefevre, PA-C  12/05/2018 11:15 AM  [facility] [address] [place] [address] [phone] (office) [phone] (fax)

## 2018-12-05 ENCOUNTER — Ambulatory Visit
Admission: RE | Admit: 2018-12-05 | Discharge: 2018-12-05 | Disposition: A | Discharge status: Home / Self Care | Payer: Medicare Other | Source: Ambulatory Visit | Attending: Orthopedic Surgery | Admitting: Orthopedic Surgery

## 2018-12-05 ENCOUNTER — Ambulatory Visit: Payer: Medicare Other | Admitting: Student

## 2018-12-05 ENCOUNTER — Other Ambulatory Visit: Payer: Self-pay

## 2018-12-05 ENCOUNTER — Encounter: Payer: Self-pay | Admitting: Student

## 2018-12-05 ENCOUNTER — Telehealth: Payer: Self-pay | Admitting: Student

## 2018-12-05 VITALS — BP 124/68 | HR 82 | Ht 66.0 in | Wt 128.6 lb

## 2018-12-05 DIAGNOSIS — S72142D Displaced intertrochanteric fracture of left femur, subsequent encounter for closed fracture with routine healing: Secondary | ICD-10-CM

## 2018-12-05 DIAGNOSIS — Z79899 Other long term (current) drug therapy: Secondary | ICD-10-CM

## 2018-12-05 LAB — CUP PACEART INCLINIC DEVICE CHECK
Battery Impedance: 137 Ohm
Battery Remaining Longevity: 125 mo
Battery Voltage: 2.78 V
Brady Statistic AP VP Percent: 0 %
Brady Statistic AP VS Percent: 93 %
Brady Statistic AS VP Percent: 0 %
Brady Statistic AS VS Percent: 7 %
Date Time Interrogation Session: 20201002115140
Implantable Lead Implant Date: 20180807
Implantable Lead Implant Date: 20180807
Implantable Lead Location: 753859
Implantable Lead Location: 753860
Implantable Lead Model: 5076
Implantable Lead Model: 5076
Implantable Pulse Generator Implant Date: 20180807
Lead Channel Impedance Value: 304 Ohm
Lead Channel Impedance Value: 496 Ohm
Lead Channel Pacing Threshold Amplitude: 0.75 V
Lead Channel Pacing Threshold Amplitude: 0.75 V
Lead Channel Pacing Threshold Amplitude: 0.75 V
Lead Channel Pacing Threshold Amplitude: 0.75 V
Lead Channel Pacing Threshold Pulse Width: 0.4 ms
Lead Channel Pacing Threshold Pulse Width: 0.4 ms
Lead Channel Pacing Threshold Pulse Width: 0.4 ms
Lead Channel Pacing Threshold Pulse Width: 0.4 ms
Lead Channel Setting Pacing Amplitude: 2 V
Lead Channel Setting Pacing Amplitude: 2.5 V
Lead Channel Setting Pacing Pulse Width: 0.4 ms
Lead Channel Setting Sensing Sensitivity: 5.6 mV

## 2018-12-05 LAB — COMPREHENSIVE METABOLIC PANEL
ALT: 7 IU/L (ref 0–32)
AST: 19 IU/L (ref 0–40)
Albumin/Globulin Ratio: 1.7 (ref 1.2–2.2)
Albumin: 4 g/dL (ref 3.6–4.6)
Alkaline Phosphatase: 146 IU/L — ABNORMAL HIGH (ref 39–117)
BUN/Creatinine Ratio: 24 (ref 12–28)
BUN: 30 mg/dL — ABNORMAL HIGH (ref 8–27)
Bilirubin Total: 0.5 mg/dL (ref 0.0–1.2)
CO2: 26 mmol/L (ref 20–29)
Calcium: 10.1 mg/dL (ref 8.7–10.3)
Chloride: 101 mmol/L (ref 96–106)
Creatinine, Ser: 1.23 mg/dL — ABNORMAL HIGH (ref 0.57–1.00)
GFR calc Af Amer: 46 mL/min/{1.73_m2} — ABNORMAL LOW (ref >59)
GFR calc non Af Amer: 40 mL/min/{1.73_m2} — ABNORMAL LOW (ref >59)
Globulin, Total: 2.4 g/dL (ref 1.5–4.5)
Glucose: 135 mg/dL — ABNORMAL HIGH (ref 65–99)
Potassium: 4.1 mmol/L (ref 3.5–5.2)
Sodium: 140 mmol/L (ref 134–144)
Total Protein: 6.4 g/dL (ref 6.0–8.5)

## 2018-12-05 LAB — TSH: TSH: 11.2 u[IU]/mL — ABNORMAL HIGH (ref 0.450–4.500)

## 2018-12-05 NOTE — Telephone Encounter (Signed)
Spoke with patient. Advised safe to wear med alert necklace or bracelet. Advised to avoid wearing/carrying directly over PPM. Pt verbalizes understanding, no further questions at this time.

## 2018-12-05 NOTE — Telephone Encounter (Signed)
° °  Patient wants to know if med alert necklace will interfere with pacer

## 2018-12-05 NOTE — Patient Instructions (Signed)
Medication Instructions:  none If you need a refill on your cardiac medications before your next appointment, please call your pharmacy.   Lab work:TODAY TSH CMET If you have labs (blood work) drawn today and your tests are completely normal, you will receive your results only by: Marland Kitchen MyChart Message (if you have MyChart) OR . A paper copy in the mail If you have any lab test that is abnormal or we need to change your treatment, we will call you to review the results.  Testing/Procedures: none  Follow-Up: 1 YEAR with APP At [facility], you and your health needs are our priority.  As part of our continuing mission to provide you with exceptional heart care, we have created designated Provider Care Teams.  These Care Teams include your primary Cardiologist (physician) and Advanced Practice Providers (APPs -  Physician Assistants and Nurse Practitioners) who all work together to provide you with the care you need, when you need it. .   Any Other Special Instructions Will Be Listed Below (If Applicable). Remote monitoring is used to monitor your Pacemaker  from home. This monitoring reduces the number of office visits required to check your device to one time per year. It allows [place] to keep an eye on the functioning of your device to ensure it is working properly. You are scheduled for a device check from home on 11/11. You may send your transmission at any time that day. If you have a wireless device, the transmission will be sent automatically. After your physician reviews your transmission, you will receive a postcard with your next transmission date.

## 2018-12-09 ENCOUNTER — Other Ambulatory Visit: Payer: Self-pay | Admitting: *Deleted

## 2018-12-09 DIAGNOSIS — Z79899 Other long term (current) drug therapy: Secondary | ICD-10-CM

## 2018-12-11 ENCOUNTER — Other Ambulatory Visit: Payer: Medicare Other | Admitting: *Deleted

## 2018-12-11 ENCOUNTER — Other Ambulatory Visit: Payer: Self-pay

## 2018-12-11 DIAGNOSIS — Z79899 Other long term (current) drug therapy: Secondary | ICD-10-CM

## 2018-12-11 LAB — TSH: TSH: 15.7 u[IU]/mL — ABNORMAL HIGH (ref 0.450–4.500)

## 2018-12-11 LAB — T3, FREE: T3, Free: 1.4 pg/mL — ABNORMAL LOW (ref 2.0–4.4)

## 2018-12-11 LAB — T4, FREE: Free T4: 1.37 ng/dL (ref 0.82–1.77)

## 2018-12-12 ENCOUNTER — Encounter (HOSPITAL_COMMUNITY): Payer: Medicare Other

## 2018-12-15 ENCOUNTER — Ambulatory Visit: Payer: Medicare Other | Admitting: Cardiology

## 2018-12-25 ENCOUNTER — Other Ambulatory Visit: Payer: Self-pay | Admitting: Orthopedic Surgery

## 2018-12-25 DIAGNOSIS — S72002A Fracture of unspecified part of neck of left femur, initial encounter for closed fracture: Secondary | ICD-10-CM

## 2018-12-26 ENCOUNTER — Ambulatory Visit (HOSPITAL_COMMUNITY)
Admission: RE | Admit: 2018-12-26 | Discharge: 2018-12-26 | Disposition: A | Discharge status: Home / Self Care | Payer: Medicare Other | Source: Ambulatory Visit | Attending: Internal Medicine | Admitting: Internal Medicine

## 2018-12-26 DIAGNOSIS — M810 Age-related osteoporosis without current pathological fracture: Secondary | ICD-10-CM | POA: Insufficient documentation

## 2018-12-26 MED ORDER — DENOSUMAB 60 MG/ML ~~LOC~~ SOSY
60.0000 mg | PREFILLED_SYRINGE | Freq: Once | SUBCUTANEOUS | Status: AC
  Administered 2018-12-26: 10:00:00 60 mg via SUBCUTANEOUS

## 2018-12-26 MED ORDER — DENOSUMAB 60 MG/ML ~~LOC~~ SOSY
PREFILLED_SYRINGE | SUBCUTANEOUS | Status: AC
  Administered 2018-12-26: 60 mg via SUBCUTANEOUS
  Filled 2018-12-26: qty 1

## 2018-12-30 ENCOUNTER — Ambulatory Visit
Admission: RE | Admit: 2018-12-30 | Discharge: 2018-12-30 | Disposition: A | Discharge status: Home / Self Care | Payer: Medicare Other | Source: Ambulatory Visit | Attending: Orthopedic Surgery | Admitting: Orthopedic Surgery

## 2018-12-30 ENCOUNTER — Other Ambulatory Visit: Payer: Self-pay

## 2018-12-30 DIAGNOSIS — S72002A Fracture of unspecified part of neck of left femur, initial encounter for closed fracture: Secondary | ICD-10-CM

## 2019-01-02 NOTE — Progress Notes (Deleted)
Cardiology Office Note:    Date:  01/02/2019   ID:  [name], [name] Sep 30, 1929, MRN [id number]  PCP:  [name], MD  Cardiologist:  Peter Martinique, MD   Referring MD: [name], MD   No chief complaint on file.   History of Present Illness:    [name] [name] is a 85 y.o. female with a hx of SSS wit PPM in place, PAF on eliquis (failed flecainide for SE, converted on amiodarone) , severe AS s/p TAVR complicated by pericarditis, HTN, CAD s/p DES to LAD (2018), chronic diastolic heart failure, GERD, HLD, and CKD. Following her stent, ASA was stopped and plavix and eliquis continued. Plavix was eventually stopped. She was last seen  on 08/08/18. She was doing well at that time. In the interim, we added losartan to her regimen for better BP control. Follow up BMP was stable   She was seen in July with complaints of dizziness. Pacemaker check was normal. Carotid dopplers showed no significant stenosis.   She was admitted in August with left hip fracture following a fall. She had IM nail/ORIF. Post op course complicated by hypotension and amlodipine and losartan were held. Creatinine went up to 1.95 but then improved. She was DC to SNF.    Past Medical History:  Diagnosis Date  . CKD (chronic kidney disease)   . Complication of anesthesia   . Coronary artery disease    a. 12/20/2016: s/p DES to LAD   . Diverticulosis   . GERD (gastroesophageal reflux disease)   . Hemorrhoids   . Hyperlipidemia   . Hypertension   . Hypothyroidism   . Osteoarthritis   . Paroxysmal atrial fibrillation (HCC)    a. on [place] with Xarelto but switched to Eliquis after stenting.   Marland Kitchen PONV (postoperative nausea and vomiting)   . Presence of permanent cardiac pacemaker   . S/P TAVR (transcatheter aortic valve replacement) 01/29/2017   23 mm Edwards Sapien 3 transcatheter heart valve placed via percutaneous right transfemoral approach     Past Surgical History:  Procedure Laterality Date  . ABDOMINAL  HYSTERECTOMY  1980  . BASAL CELL CARCINOMA EXCISION    . CARDIAC CATHETERIZATION  ~ 11/2016  . CATARACT EXTRACTION W/ INTRAOCULAR LENS  IMPLANT, BILATERAL Bilateral   . CORONARY ANGIOPLASTY WITH STENT PLACEMENT  12/20/2016   "1 stent"  . CORONARY STENT INTERVENTION N/A 12/20/2016   Procedure: CORONARY STENT INTERVENTION;  Surgeon: Martinique, Peter M, MD;  Location: [place] [facility];  Service: Cardiovascular;  Laterality: N/A;  . FINGER SURGERY Left    "fell; developed skiers thumb; had to operate on it"  . INSERT / REPLACE / [place]   for syptomatic bradycardia and syncope -- in [facility]  . INTRAMEDULLARY (IM) NAIL INTERTROCHANTERIC Left 10/19/2018   Procedure: INTRAMEDULLARY (IM) NAIL INTERTROCHANTRIC;  Surgeon: Altamese Seffner, MD;  Location: [place];  Service: Orthopedics;  Laterality: Left;  . LEAD REVISION/REPAIR N/A 10/09/2016   New left subclavian MDT Adapta L PPM dual chamber system implanted by Dr [name] with previously placed R subclavian system abandoned  . PACEMAKER GENERATOR CHANGE  2001   pulse generator replacement by Dr. Rollene Fare   . PACEMAKER GENERATOR CHANGE  04/01/2008   PPM Medtronic -- model # [id number] serial # [id number] -- pulse generator replacement by Dr. Verlon Setting   . RIGHT/LEFT HEART CATH AND CORONARY ANGIOGRAPHY N/A 11/27/2016   Procedure: RIGHT/LEFT HEART CATH AND CORONARY ANGIOGRAPHY;  Surgeon: Martinique, Peter M, MD;  Location: [place] [facility];  Service: Cardiovascular;  Laterality: N/A;  . SQUAMOUS CELL CARCINOMA EXCISION    . TEE WITHOUT CARDIOVERSION N/A 01/29/2017   Procedure: TRANSESOPHAGEAL ECHOCARDIOGRAM (TEE);  Surgeon: Sherren Mocha, MD;  Location: [place];  Service: Open Heart Surgery;  Laterality: N/A;  . TONSILLECTOMY    . TRANSCATHETER AORTIC VALVE REPLACEMENT, TRANSFEMORAL N/A 01/29/2017   Procedure: TRANSCATHETER AORTIC VALVE REPLACEMENT, TRANSFEMORAL;  Surgeon: Sherren Mocha, MD;  Location: [place];  Service: Open Heart Surgery;   Laterality: N/A;    Current Medications: No outpatient medications have been marked as taking for the 01/06/19 encounter (Appointment) with Martinique, Peter M, MD.     Allergies:   Penicillins, Tetanus toxoid, Demerol, and Codeine   Social History   Socioeconomic History  . Marital status: Widowed    Spouse name: Not on file  . Number of children: 0  . Years of education: Not on file  . Highest education level: Not on file  Occupational History    Employer: RETIRED  Social Needs  . Financial resource strain: Not on file  . Food insecurity    Worry: Not on file    Inability: Not on file  . Transportation needs    Medical: Not on file    Non-medical: Not on file  Tobacco Use  . Smoking status: Former Smoker    Packs/day: 1.50    Years: 30.00    Pack years: 45.00    Types: Cigarettes    Quit date: 03/05/1978    Years since quitting: 40.8  . Smokeless tobacco: Never Used  Substance and Sexual Activity  . Alcohol use: No  . Drug use: No  . Sexual activity: Not on file  Lifestyle  . Physical activity    Days per week: Not on file    Minutes per session: Not on file  . Stress: Not on file  Relationships  . Social Herbalist on phone: Not on file    Gets together: Not on file    Attends religious service: Not on file    Active member of club or organization: Not on file    Attends meetings of clubs or organizations: Not on file    Relationship status: Not on file  Other Topics Concern  . Not on file  Social History Narrative  . Not on file     Family History: The patient's family history includes Alzheimer's disease in her mother; Cancer in her father.  ROS:   Please see the history of present illness.     All other systems reviewed and are negative.  EKGs/Labs/Other Studies Reviewed:    The following studies were reviewed today:  Reviewed recent PPM transmissions  EKG:  EKG is  ordered today.  The ekg ordered today demonstrates atrial-paced rhythm  HR 69  Recent Labs: 10/23/2018: Hemoglobin 8.5; Platelets 147 12/05/2018: ALT 7; BUN 30; Creatinine, Ser 1.23; Potassium 4.1; Sodium 140 12/11/2018: TSH 15.700  Recent Lipid Panel    Component Value Date/Time   CHOL 219 (H) 02/05/2018 1440   TRIG 70 02/05/2018 1440   HDL 103 02/05/2018 1440   CHOLHDL 2.1 02/05/2018 1440   LDLCALC 102 (H) 02/05/2018 1440    Physical Exam:    VS:  There were no vitals taken for this visit.    Wt Readings from Last 3 Encounters:  12/05/18 128 lb 9.6 oz (58.3 kg)  10/29/18 133 lb 9.6 oz (60.6 kg)  10/03/18 125 lb 12.8 oz (57.1  kg)     GEN: elderly female, very pleasant,  in no acute distress HEENT: Normal NECK: No JVD; No carotid bruits LYMPHATICS: No lymphadenopathy CARDIAC: RRR, no murmurs, rubs, gallops RESPIRATORY:  Clear to auscultation without rales, wheezing or rhonchi  ABDOMEN: Soft, non-tender, non-distended MUSCULOSKELETAL:  No edema; No deformity  SKIN: Warm and dry NEUROLOGIC:  Alert and oriented x 3 PSYCHIATRIC:  Normal affect   ASSESSMENT:    No diagnosis found. PLAN:    In order of problems listed above:  1.Atrial fibrillation with sick sinus syndrome. Prior history of persistent AFib. Now on amiodarone 100 mg daily. Maintaining NSR. Last pacer check 2 weeks ago without significant arrhythmia. Continue long-term anticoagulation.   2. Aortic stenosis- symptomatic. Now s/p TAVR. She has made an excellent recovery. Follow up Echo  OK. Only mild perivalvular leak.  3. CAD with significant single vessel obstructive disease in the mid LAD. S/p PCI of the LAD with DES October 2018. OnEliquis. Now off Plavix.   4. HTN- control has improved significantly with increase in metoprolol. Will continue current therapy and monitor.  5. Hyperlipidemia. Intolerant of statins. Now on Zetia 10 mg dailywith improved LDL from 145>>102.  6. Chronic diastolic CHF. She is well compensated. Weight is down Continue lasix 5x/wk.    7. CKD stage 3. Improved on last labs.  8. S/p left hip fracture.  Medication Adjustments/Labs and Tests Ordered: Current medicines are reviewed at length with the patient today.  Concerns regarding medicines are outlined above.  No orders of the defined types were placed in this encounter.  No orders of the defined types were placed in this encounter.   Signed, Peter Martinique, MD  01/02/2019 3:41 PM    [facility]

## 2019-01-05 ENCOUNTER — Ambulatory Visit: Payer: Medicare Other | Admitting: Cardiology

## 2019-01-05 ENCOUNTER — Other Ambulatory Visit: Payer: Self-pay

## 2019-01-05 ENCOUNTER — Encounter: Payer: Self-pay | Admitting: Cardiology

## 2019-01-05 VITALS — BP 119/67 | HR 67 | Ht 65.5 in | Wt 125.2 lb

## 2019-01-05 DIAGNOSIS — I480 Paroxysmal atrial fibrillation: Secondary | ICD-10-CM | POA: Diagnosis not present

## 2019-01-05 DIAGNOSIS — Z950 Presence of cardiac pacemaker: Secondary | ICD-10-CM

## 2019-01-05 DIAGNOSIS — I350 Nonrheumatic aortic (valve) stenosis: Secondary | ICD-10-CM

## 2019-01-05 DIAGNOSIS — I2510 Atherosclerotic heart disease of native coronary artery without angina pectoris: Secondary | ICD-10-CM

## 2019-01-05 DIAGNOSIS — I10 Essential (primary) hypertension: Secondary | ICD-10-CM

## 2019-01-05 DIAGNOSIS — Z952 Presence of prosthetic heart valve: Secondary | ICD-10-CM

## 2019-01-05 NOTE — Progress Notes (Signed)
[name] [name] Date of Birth: 1930-01-03 Medical Record [id number]  History of Present Illness: [name] [name] is seen today for follow up CAD and AV stenosis. She is s/p  PCI with DES to LAD on December 20, 2016.   She has a history of paroxysmal atrial fibrillation with tachy-brady syndrome and is s/p pacemaker implant. She is on chronic anticoagulation with Eliquis. Echo in July 2015 showed moderate aortic stenosis that had progressed since 2012. Repeat in July 2016 and July 2017 showed no change. In April 2017 she had atrial fibrillation that converted spontaneously. This was felt to be triggered by a steroid injection. Her metoprolol was increased at that time. When seen in June 2017 she was noted to be in persistent Afib. We placed her on Flecainide but this was later stopped due to side effects. She was seen in November with recurrent and persistent Afib. Rate was controlled but she was symptomatic. Seen in Afib clinic and started on amiodarone with subsequent conversion to NSR.   She later developed symptoms of dyspnea.   She did have a upper and lower EGD which was negative except for few polyps. She had PFTs in December that showed mild obstructive defect with marked decrease in diffusion capacity. CT of the Abdomen was negative for any pathology. She reports Dr. Virgina Jock did an [place] as well.  Myoview study was normal. When seen by Dr. [name] in late July it was noted that she had failure of her RA lead. Her device was reprogrammed but she developed pectoral stimulation. She underwent explantation of her old generator and placement of a new left chest dual chamber pacemaker on October 09 2016. Because of her ongoing symptoms we recommended a right and left heart cath and this was done on 11/27/16. This demonstrated severe AS as well as a severe stenosis in the mid LAD. She subsequently underwent successful PCI of the LAD with DES on 12/20/16 with 2.75 x 16 mm Promus stent.   She underwent TAVR on November  27,2018  with a #23 Edwards Sapien valve. Post op course complicated by pericarditis and AFib with RVR she was treated with anti-inflammatories and Amiodarone. Later Eliquis resumed and ASA stopped. Still on Plavix for stent. Follow up Echo was [place]. Repeat Echo 01/22/18 still showed good results. Pacemaker check on 12/12/17 showed normal function without atrial high rate episodes. When seen in the valve clinic on 01/22/18 Plavix was discontinued.   She was seen for complaint of dizziness in July. Pacer check was OK. Carotid dopplers OK.  In August she was admitted after mechanical fall and had hip surgery. She had blood loss anemia and acute kidney injury that improved. BP was low and medication reduced but later resumed. She has since completed Rehab therapy at [facility]. Still has in home therapy twice a week. She has done well except has developed pain in her left hip area. She denies any chest pain, SOB, palpitations. Notes dizziness is better.    Current Outpatient Medications on File Prior to Visit  Medication Sig Dispense Refill  . acetaminophen (TYLENOL) 325 MG tablet Take 2 tablets (650 mg total) by mouth every 6 (six) hours. 60 tablet 0  . alum & mag hydroxide-simeth (MAALOX/MYLANTA) 200-200-20 MG/5ML suspension Take 30 mLs by mouth every 4 (four) hours as needed for indigestion or heartburn. 355 mL 0  . amiodarone (PACERONE) 200 MG tablet Take 0.5 tablets (100 mg total) by mouth daily. 45 tablet 3  . apixaban (ELIQUIS) 2.5 MG  TABS tablet Take 2.5 mg by mouth 2 (two) times daily.    . calcium carbonate (OS-CAL) 600 MG TABS Take 600 mg by mouth 2 (two) times daily with a meal.     . cholecalciferol (VITAMIN D) 25 MCG tablet Take 2 tablets (2,000 Units total) by mouth 2 (two) times daily. 60 tablet 2  . clindamycin (CLEOCIN) 300 MG capsule Take 2 tablets by mouth one hour prior to any dental appointments and stomach or bladder procedure. 2 capsule 4  . dicyclomine (BENTYL) 10 MG capsule  Take 10 mg by mouth 2 (two) times daily as needed for spasms.   1  . docusate sodium (COLACE) 100 MG capsule Take 1 capsule (100 mg total) by mouth 2 (two) times daily. 10 capsule 0  . ezetimibe (ZETIA) 10 MG tablet Take 1 tablet (10 mg total) by mouth daily. 90 tablet 3  . feeding supplement, ENSURE ENLIVE, (ENSURE ENLIVE) LIQD Take 237 mLs by mouth 2 (two) times daily between meals. 237 mL 12  . fluticasone (FLONASE) 50 MCG/ACT nasal spray Place 2 sprays into both nostrils as needed. Sinus clog    . HYDROcodone-acetaminophen (NORCO/VICODIN) 5-325 MG tablet Take 1-2 tablets by mouth every 6 (six) hours as needed for moderate pain or severe pain. 50 tablet 0  . loratadine (CLARITIN) 10 MG tablet Take 10 mg by mouth daily as needed for allergies.    Marland Kitchen losartan (COZAAR) 25 MG tablet Take 25 mg by mouth daily.     . metoprolol tartrate (LOPRESSOR) 25 MG tablet Take 0.5 tablets (12.5 mg total) by mouth 2 (two) times daily. 270 tablet 1  . Multiple Vitamins-Minerals (PRESERVISION AREDS 2) CAPS Take 1 capsule by mouth 2 (two) times daily.    . nitroGLYCERIN (NITROSTAT) 0.4 MG SL tablet Place 1 tablet (0.4 mg total) under the tongue every 5 (five) minutes as needed for chest pain. 25 tablet 12  . pantoprazole (PROTONIX) 40 MG tablet Take 1 tablet (40 mg total) by mouth 2 (two) times daily before a meal. 90 tablet 3  . polyethylene glycol (MIRALAX / GLYCOLAX) 17 g packet Take 17 g by mouth 2 (two) times daily. 14 each 0  . polyvinyl alcohol (LIQUIFILM TEARS) 1.4 % ophthalmic solution Place 1-2 drops into both eyes 3 (three) times daily as needed for dry eyes.    . sodium chloride (OCEAN) 0.65 % SOLN nasal spray Place 1 spray into both nostrils as needed for congestion.    Marland Kitchen SYNTHROID 125 MCG tablet Take 125 mcg by mouth daily before breakfast.     . vitamin C (VITAMIN C) 500 MG tablet Take 1 tablet (500 mg total) by mouth daily. 30 tablet 2   No current facility-administered medications on file prior to  visit.     Allergies  Allergen Reactions  . Penicillins Anaphylaxis and Other (See Comments)    Has patient had a PCN reaction causing immediate rash, facial/tongue/throat swelling, SOB or lightheadedness with hypotension: Yes Has patient had a PCN reaction causing severe rash involving mucus membranes or skin necrosis: No Has patient had a PCN reaction that required hospitalization: No Has patient had a PCN reaction occurring within the last 10 years: No If all of the above answers are "NO", then may proceed with Cephalosporin use.   . Tetanus Toxoid Anaphylaxis  . Demerol Other (See Comments)    Severe nausea  . Codeine Nausea Only and Other (See Comments)    Severe nausea    Past Medical History:  Diagnosis Date  . CKD (chronic kidney disease)   . Complication of anesthesia   . Coronary artery disease    a. 12/20/2016: s/p DES to LAD   . Diverticulosis   . GERD (gastroesophageal reflux disease)   . Hemorrhoids   . Hyperlipidemia   . Hypertension   . Hypothyroidism   . Osteoarthritis   . Paroxysmal atrial fibrillation (HCC)    a. on [place] with Xarelto but switched to Eliquis after stenting.   Marland Kitchen PONV (postoperative nausea and vomiting)   . Presence of permanent cardiac pacemaker   . S/P TAVR (transcatheter aortic valve replacement) 01/29/2017   23 mm Edwards Sapien 3 transcatheter heart valve placed via percutaneous right transfemoral approach     Past Surgical History:  Procedure Laterality Date  . ABDOMINAL HYSTERECTOMY  1980  . BASAL CELL CARCINOMA EXCISION    . CARDIAC CATHETERIZATION  ~ 11/2016  . CATARACT EXTRACTION W/ INTRAOCULAR LENS  IMPLANT, BILATERAL Bilateral   . CORONARY ANGIOPLASTY WITH STENT PLACEMENT  12/20/2016   "1 stent"  . CORONARY STENT INTERVENTION N/A 12/20/2016   Procedure: CORONARY STENT INTERVENTION;  Surgeon: Martinique, Peter M, MD;  Location: [place] [facility];  Service: Cardiovascular;  Laterality: N/A;  . FINGER SURGERY Left    "fell;  developed skiers thumb; had to operate on it"  . INSERT / REPLACE / [place]   for syptomatic bradycardia and syncope -- in [facility]  . INTRAMEDULLARY (IM) NAIL INTERTROCHANTERIC Left 10/19/2018   Procedure: INTRAMEDULLARY (IM) NAIL INTERTROCHANTRIC;  Surgeon: Altamese Homestead, MD;  Location: [place];  Service: Orthopedics;  Laterality: Left;  . LEAD REVISION/REPAIR N/A 10/09/2016   New left subclavian MDT Adapta L PPM dual chamber system implanted by Dr [name] with previously placed R subclavian system abandoned  . PACEMAKER GENERATOR CHANGE  2001   pulse generator replacement by Dr. Rollene Fare   . PACEMAKER GENERATOR CHANGE  04/01/2008   PPM Medtronic -- model # [id number] serial # [id number] -- pulse generator replacement by Dr. Verlon Setting   . RIGHT/LEFT HEART CATH AND CORONARY ANGIOGRAPHY N/A 11/27/2016   Procedure: RIGHT/LEFT HEART CATH AND CORONARY ANGIOGRAPHY;  Surgeon: Martinique, Peter M, MD;  Location: [place] [facility];  Service: Cardiovascular;  Laterality: N/A;  . SQUAMOUS CELL CARCINOMA EXCISION    . TEE WITHOUT CARDIOVERSION N/A 01/29/2017   Procedure: TRANSESOPHAGEAL ECHOCARDIOGRAM (TEE);  Surgeon: Sherren Mocha, MD;  Location: [place];  Service: Open Heart Surgery;  Laterality: N/A;  . TONSILLECTOMY    . TRANSCATHETER AORTIC VALVE REPLACEMENT, TRANSFEMORAL N/A 01/29/2017   Procedure: TRANSCATHETER AORTIC VALVE REPLACEMENT, TRANSFEMORAL;  Surgeon: Sherren Mocha, MD;  Location: [place];  Service: Open Heart Surgery;  Laterality: N/A;    Social History   Tobacco Use  Smoking Status Former Smoker  . Packs/day: 1.50  . Years: 30.00  . Pack years: 45.00  . Types: Cigarettes  . Quit date: 03/05/1978  . Years since quitting: 40.8  Smokeless Tobacco Never Used    Social History   Substance and Sexual Activity  Alcohol Use No    Family History  Problem Relation Age of Onset  . Cancer Father        lung cancer  . Alzheimer's disease Mother     Review of Systems: As  noted in history of present illness.  All other systems were reviewed and are negative.  Physical Exam: BP 119/67   Pulse 67   Ht 5' 5.5" (1.664 m)   Wt 125  lb 3.2 oz (56.8 kg)   SpO2 95%   BMI 20.52 kg/m  GENERAL:  Well appearing, elderly WF in NAD. Walks with walker.  HEENT:  PERRL, EOMI, sclera are clear. Oropharynx is clear. NECK:  No jugular venous distention, carotid upstroke brisk and symmetric, no bruits, no thyromegaly or adenopathy LUNGS:  Clear to auscultation bilaterally CHEST:  Unremarkable HEART:  RRR,  PMI not displaced or sustained,S1 and S2 within normal limits, no S3, no S4: no clicks, no rubs, very soft systolic murmur at the apex. ABD:  Soft, nontender. BS +, no masses or bruits. No hepatomegaly, no splenomegaly EXT:  2 + pulses throughout, no edema, no cyanosis no clubbing SKIN:  Warm and dry.  No rashes NEURO:  Alert and oriented x 3. Cranial nerves II through XII intact. PSYCH:  Cognitively intact      LABORATORY DATA:  Lab Results  Component Value Date   WBC 7.1 10/23/2018   HGB 8.5 (L) 10/23/2018   HCT 26.0 (L) 10/23/2018   PLT 147 (L) 10/23/2018   GLUCOSE 135 (H) 12/05/2018   CHOL 219 (H) 02/05/2018   TRIG 70 02/05/2018   HDL 103 02/05/2018   LDLCALC 102 (H) 02/05/2018   ALT 7 12/05/2018   AST 19 12/05/2018   NA 140 12/05/2018   K 4.1 12/05/2018   CL 101 12/05/2018   CREATININE 1.23 (H) 12/05/2018   BUN 30 (H) 12/05/2018   CO2 26 12/05/2018   TSH 15.700 (H) 12/11/2018   INR 1.23 01/29/2017   HGBA1C 5.5 01/28/2017   Labs dated 07/15/15: cholesterol 229, triglycerides 111, HDL 76, LDL 131.  Dated 07/19/16: cholesterol 247, triglycerides 71, HDL 88, LDL 145. BUN 28, creatinine 1.3. Other chemistries, CBC and TSH normal. Dated 03/15/17: creatinine 1.7, Hgb 11.3. Dated 10/22/17: cholesterol 251, triglycerides 66, HDL 93, LDL 145. Hgb 11.7, creatinine 1.3. TSH and ALT normal.    Assessment / Plan: 1. Atrial fibrillation with sick sinus  syndrome. Prior history of  persistent AFib.  Now on amiodarone 100 mg daily. Maintaining NSR. Continue long-term anticoagulation. On  Eliquis 2.5 mg bid.  Dose adjusted for age and renal function.   2. Aortic stenosis-   symptomatic. Now s/p TAVR. She has made an excellent  recovery. Follow up Echo and exam OK.   3. CAD with significant single vessel obstructive disease in the mid LAD. S/p PCI of the LAD with DES October 2018.  On   Eliquis.  4. HTN- controlled.  5. Hyperlipidemia. Intolerant of statins. Now on Zetia 10 mg daily.   6. Chronic diastolic CHF. She is well compensated. Off lasix.  7. S/p femoral neck fracture with ORIF. Follow up with Ortho.  Follow up in 6 months.

## 2019-01-06 ENCOUNTER — Ambulatory Visit: Payer: Medicare Other | Admitting: Cardiology

## 2019-01-14 ENCOUNTER — Encounter: Payer: Medicare Other | Admitting: *Deleted

## 2019-01-15 ENCOUNTER — Telehealth: Payer: Self-pay

## 2019-01-15 NOTE — Telephone Encounter (Signed)
Spoke with patient to remind of missed remote transmission LL 

## 2019-02-11 ENCOUNTER — Other Ambulatory Visit: Payer: Self-pay | Admitting: Orthopedic Surgery

## 2019-02-11 DIAGNOSIS — S72142D Displaced intertrochanteric fracture of left femur, subsequent encounter for closed fracture with routine healing: Secondary | ICD-10-CM

## 2019-02-13 ENCOUNTER — Other Ambulatory Visit: Payer: Self-pay

## 2019-02-16 ENCOUNTER — Ambulatory Visit (INDEPENDENT_AMBULATORY_CARE_PROVIDER_SITE_OTHER): Payer: Medicare Other | Admitting: *Deleted

## 2019-02-16 DIAGNOSIS — Z950 Presence of cardiac pacemaker: Secondary | ICD-10-CM

## 2019-02-16 LAB — CUP PACEART REMOTE DEVICE CHECK
Battery Impedance: 161 Ohm
Battery Remaining Longevity: 124 mo
Battery Voltage: 2.78 V
Brady Statistic AP VP Percent: 0 %
Brady Statistic AP VS Percent: 88 %
Brady Statistic AS VP Percent: 0 %
Brady Statistic AS VS Percent: 11 %
Date Time Interrogation Session: 20201214124407
Implantable Lead Implant Date: 20180807
Implantable Lead Implant Date: 20180807
Implantable Lead Location: 753859
Implantable Lead Location: 753860
Implantable Lead Model: 5076
Implantable Lead Model: 5076
Implantable Pulse Generator Implant Date: 20180807
Lead Channel Impedance Value: 334 Ohm
Lead Channel Impedance Value: 519 Ohm
Lead Channel Pacing Threshold Amplitude: 0.625 V
Lead Channel Pacing Threshold Amplitude: 0.75 V
Lead Channel Pacing Threshold Pulse Width: 0.4 ms
Lead Channel Pacing Threshold Pulse Width: 0.4 ms
Lead Channel Setting Pacing Amplitude: 2 V
Lead Channel Setting Pacing Amplitude: 2.5 V
Lead Channel Setting Pacing Pulse Width: 0.4 ms
Lead Channel Setting Sensing Sensitivity: 5.6 mV

## 2019-02-18 ENCOUNTER — Telehealth: Payer: Self-pay | Admitting: *Deleted

## 2019-02-18 ENCOUNTER — Telehealth: Payer: Self-pay | Admitting: Cardiology

## 2019-02-18 NOTE — Telephone Encounter (Signed)
Returned call to patient.She stated she needs clarification with her medications.Stated when she was discharged from hospital in August she was never given any medication instructions.Stated she went to rehab for 3 weeks.Stated she has been taking Amlodipine 5 mg daily,Furosemide 20 mg every other day,Losartan 25 mg daily.Stated when she saw [name] last month she was taking too.Stated dizziness has improved.Advised I will speak to [name] 12/17 and call her back.

## 2019-02-18 NOTE — Telephone Encounter (Signed)
New Message  Patient is calling in to get some clarification about her medications. States that she would like a call back from Dr. Doug Sou nurse Malachy Mood. Please give patient a call back.

## 2019-02-18 NOTE — Telephone Encounter (Signed)
I know she was on losartan when I saw her in November. I believe her meds were held due to hypotension when she was in hospital but were later resumed. If she is taking them I would resume but need to update chart.  Nicolemarie Wooley Martinique MD, [facility]

## 2019-02-18 NOTE — Telephone Encounter (Signed)
Pt called requesting refills for Amlodipine, Furosemide, and Losartan. According to d/c paperwork from 10/28/2018, meds was discontinued. They are not on pt's med list, but pt states that she has never stopped taking them and needs refills. Pt aware I will send a message to Dr. Doug Sou office to help with the confusion for the pt and have someone call her back.

## 2019-02-19 ENCOUNTER — Other Ambulatory Visit: Payer: Self-pay

## 2019-02-19 MED ORDER — AMLODIPINE BESYLATE 5 MG PO TABS: 5.0000 mg | ORAL_TABLET | Freq: Every day | ORAL | 3 refills | Status: DC

## 2019-02-19 NOTE — Telephone Encounter (Signed)
See previous 12/17 telephone note.

## 2019-02-19 NOTE — Telephone Encounter (Signed)
Spoke to patient [name] advised to continue Amlodipine 5 mg daily,Furosemide 20 mg every other day,Losartan 25 mg daily.Advised to keep appointment with [name] as planned.

## 2019-02-20 ENCOUNTER — Ambulatory Visit
Admission: RE | Admit: 2019-02-20 | Discharge: 2019-02-20 | Disposition: A | Discharge status: Home / Self Care | Payer: Medicare Other | Source: Ambulatory Visit | Attending: Orthopedic Surgery | Admitting: Orthopedic Surgery

## 2019-02-20 DIAGNOSIS — S72142D Displaced intertrochanteric fracture of left femur, subsequent encounter for closed fracture with routine healing: Secondary | ICD-10-CM

## 2019-03-13 ENCOUNTER — Telehealth: Payer: Self-pay | Admitting: Cardiology

## 2019-03-13 MED ORDER — LOSARTAN POTASSIUM 25 MG PO TABS: 25.0000 mg | ORAL_TABLET | Freq: Every day | ORAL | 3 refills | Status: DC

## 2019-03-13 MED ORDER — METOPROLOL TARTRATE 25 MG PO TABS: 12.5000 mg | ORAL_TABLET | Freq: Two times a day (BID) | ORAL | 3 refills | Status: DC

## 2019-03-13 MED ORDER — EZETIMIBE 10 MG PO TABS: 10.0000 mg | ORAL_TABLET | Freq: Every day | ORAL | 3 refills | Status: DC

## 2019-03-13 NOTE — Telephone Encounter (Signed)
New Message  Patient would like Malachy Mood to give her a call to discuss her medications.

## 2019-03-13 NOTE — Telephone Encounter (Signed)
Spoke to patient she requested 90 day refills for losartan,metoprolol,zetia.Refills sent to pharmacy.

## 2019-03-19 ENCOUNTER — Other Ambulatory Visit: Payer: Self-pay | Admitting: Physician Assistant

## 2019-03-22 IMAGING — CT CT HEART MORP W/ CTA COR W/ SCORE W/ CA W/CM &/OR W/O CM
2 of 4 series · 14 of 20 positions shown, 16 images · IV contrast (APPLIED)
Comparison: none

CLINICAL DATA: Aortic stenosis

EXAM:
Cardiac TAVR CT
TECHNIQUE: The patient was scanned on a Siemens 192 scanner. A 120 kV
retrospective scan was triggered in the Ascending thoracic aorta at
140 HU's. Gantry rotation speed was 250 msecs and collimation was .6
mm. No beta blockade or nitro were given. The 3D data set was
reconstructed in 5% intervals of the R-R cycle. Systolic and
diastolic phases were analyzed on a dedicated work station using
MPR, MIP and VRT modes. The patient received 80 cc of contrast. Due
to Phendulwa Tiya bicarbonate infusion was given before study

[Series 5: best diast 80 % · axial · 0.39mm/px · z∈[-192,-4]mm · 7 of 628 slices shown, 9 images]
[im 79/628  vessel]
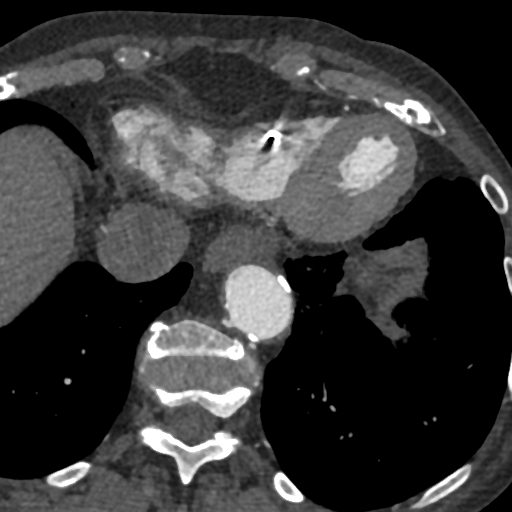
[im 79/628  lung]
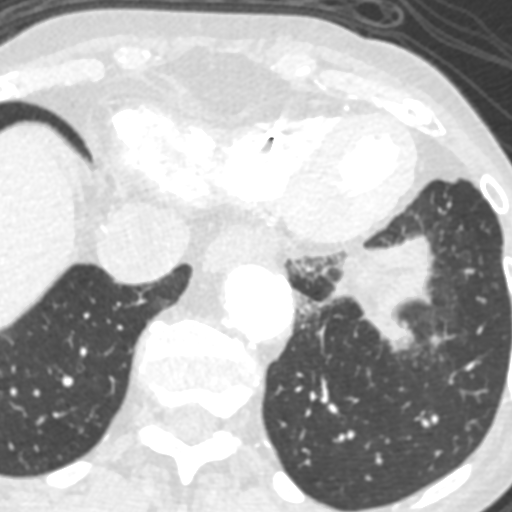
[im 157/628  vessel]
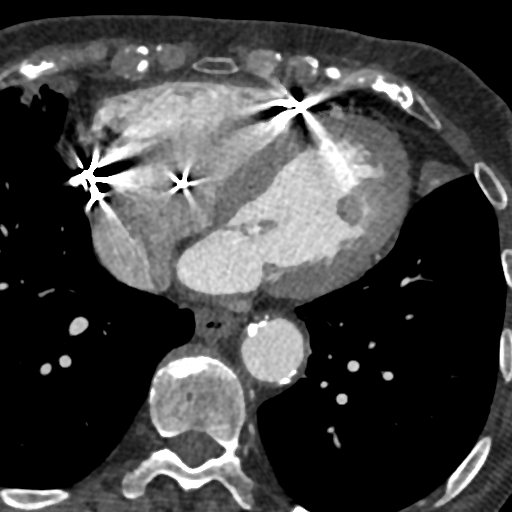
[im 236/628  vessel]
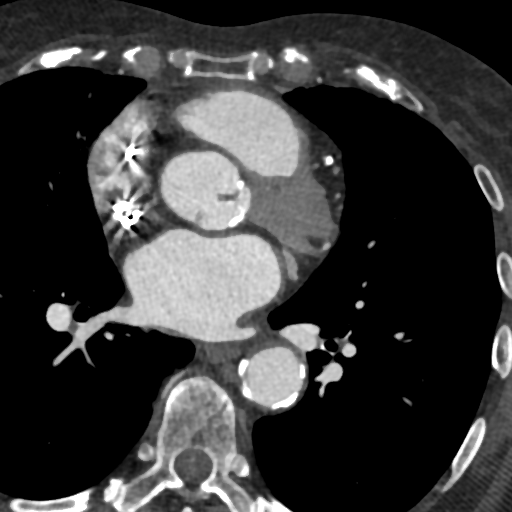
[im 314/628  vessel]
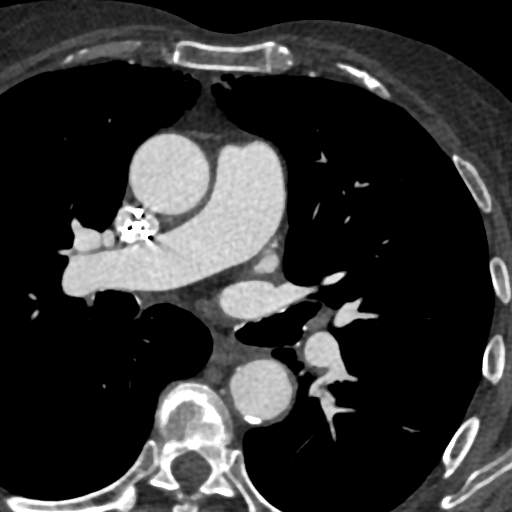
[im 392/628  vessel]
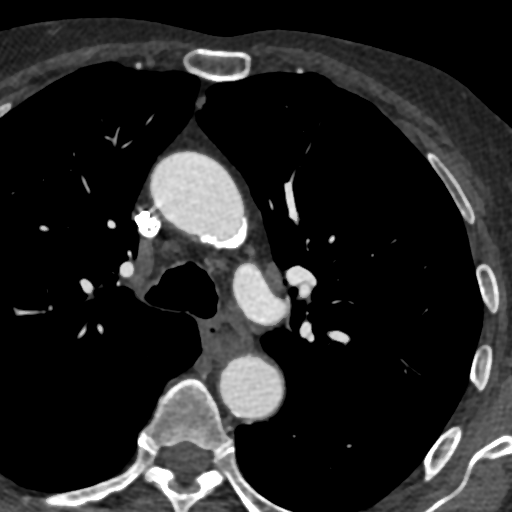
[im 392/628  lung]
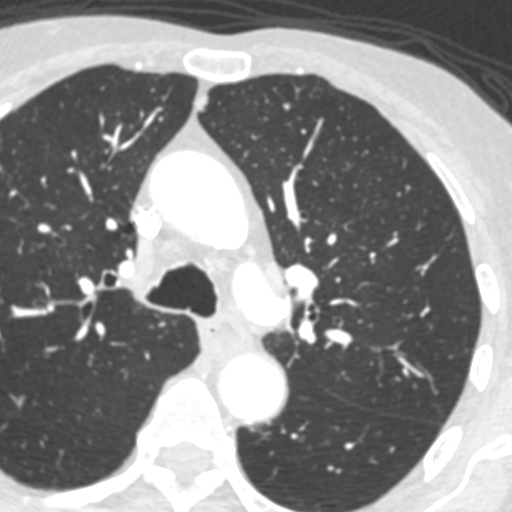
[im 471/628  vessel]
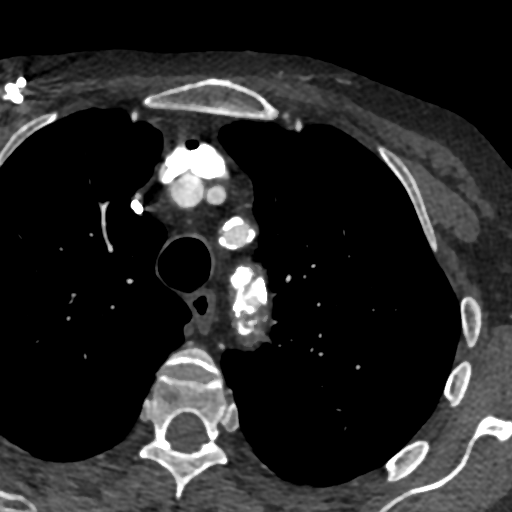
[im 549/628  vessel]
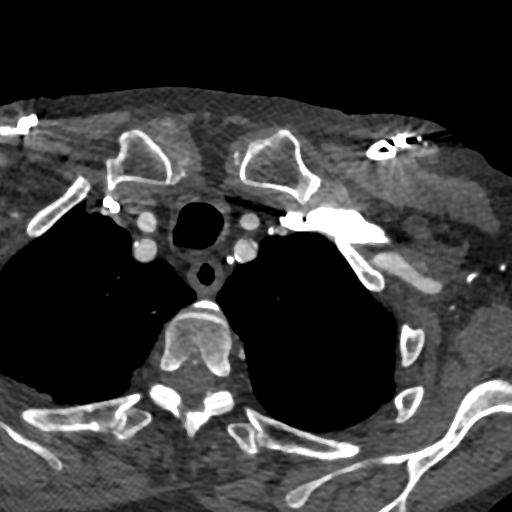

[Series 6: best syst 45 % · axial · 0.39mm/px · z∈[-192,-4]mm · 7 of 628 slices shown]
[im 79/628  vessel]
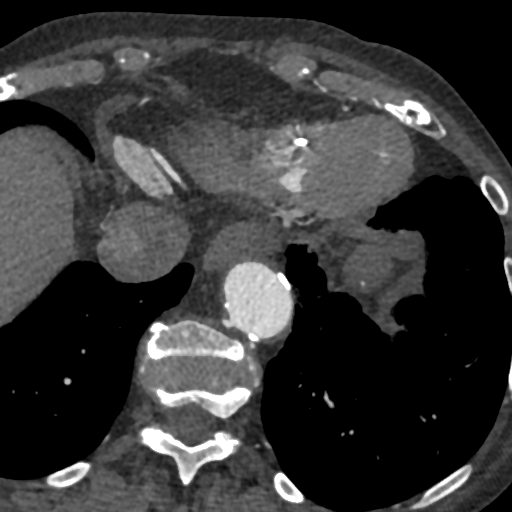
[im 157/628  vessel]
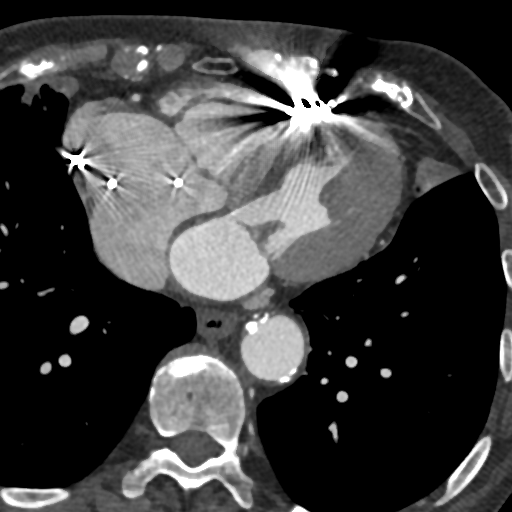
[im 236/628  vessel]
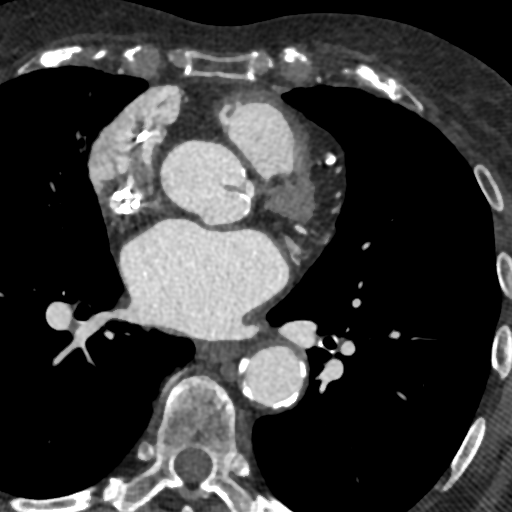
[im 314/628  vessel]
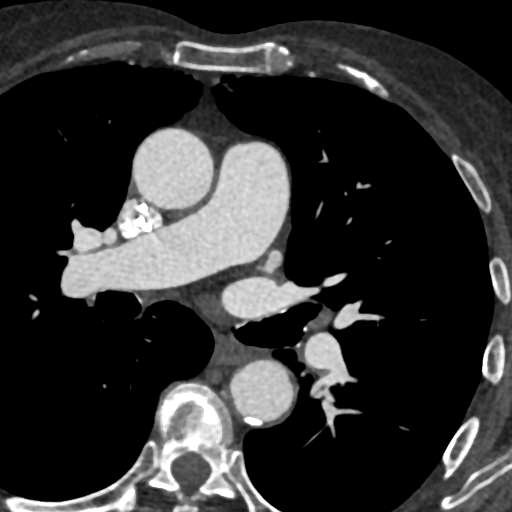
[im 392/628  vessel]
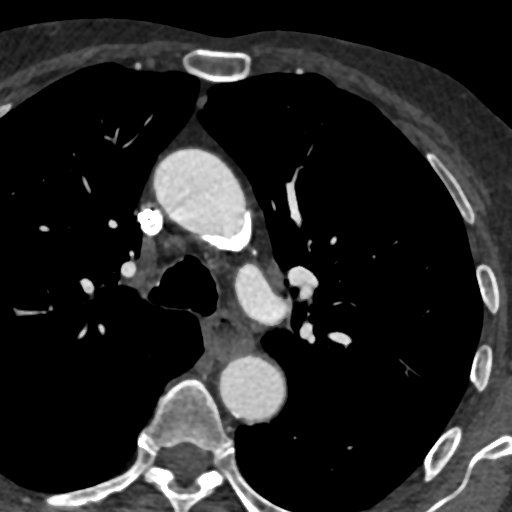
[im 471/628  vessel]
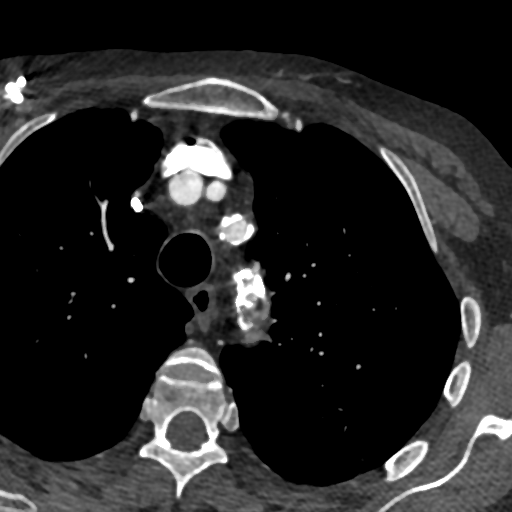
[im 549/628  vessel]
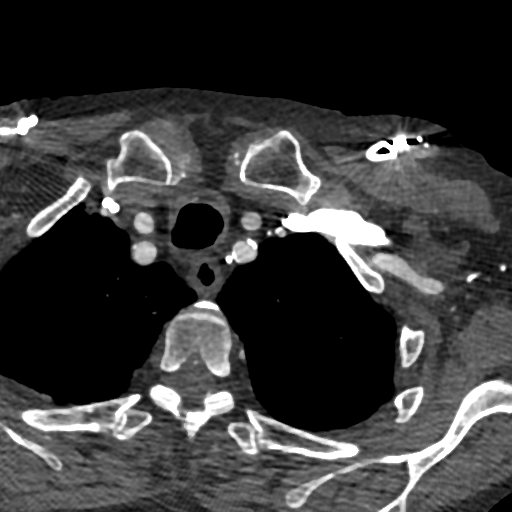

[14 of 20 positions shown; findings below may reference images not displayed]

FINDINGS: Aortic Valve: Tri leaflet and heavily calcified with restricted
systolic motion

Aorta: Marked calcific atherosclerosis of the arch, left subclavian
and descending thoracic aorta

Sinotubular Junction:  24 mm

Ascending Thoracic Aorta:  32 mm

Aortic Arch:  26 mm

Descending Thoracic Aorta:  26 mm

Sinus of Valsalva Measurements:

Non-coronary:  29 mm

Right -coronary:  28 mm

Left -coronary:  29 mm

Coronary Artery Height above Annulus:

Left Main:  11.5 mm above annulus

Right Coronary:  10.1 mm above annulus

Virtual Basal Annulus Measurements:

Maximum/Minimum Diameter:  24 mm x 20.8 mm

Perimeter:  71 mm

Area:  392 mm 2

Coronary Arteries: Sufficient height above annulus for deployment
patent stent in mid LAD Although RCA origin is a bit shallow

Optimum Fluoroscopic Angle for Delivery: LAO 33 degrees Caudal 4
degrees
IMPRESSION: 1) Calcified tri leaflet aortic valve with annular area of 392 mm 2
suitable for a 23 mm Sapiens 3 valve and perimeter of 71 mm suitable
for a 26 mm Evolut Pro valve

2) Coronary arteries sufficient height above annulus for deployment
although RCA is a bit shallow

3) Marked calcific atherosclerosis of the aortic arch, left
subclavian origin and descending thoracic aorta

4) Optimum angiographic angle for deployment LAO 33 degrees Caudal 4
degrees

5) No TAVIRA thrombus

6) Pacing wires seen in RA/RV

7) Patient stent in mid SHORIFA

Kiri Jim

EXAM:
OVER-READ INTERPRETATION  CT CHEST

Because a diagnostic CTA chest was performed concurrently with the
cardiac CT, an over-read is not necessary. Please see the results of
the concurrent CTA chest. The coronary calcium score/coronary CTA
interpretation by the cardiologist is attached.

## 2019-03-22 NOTE — Progress Notes (Signed)
PPM remote 

## 2019-03-25 ENCOUNTER — Telehealth: Payer: Self-pay

## 2019-03-25 ENCOUNTER — Other Ambulatory Visit: Payer: Self-pay | Admitting: Orthopedic Surgery

## 2019-03-25 DIAGNOSIS — M1612 Unilateral primary osteoarthritis, left hip: Secondary | ICD-10-CM

## 2019-03-25 NOTE — Telephone Encounter (Signed)
   [facility] Pre-operative Risk Assessment    Request for surgical clearance:  1. What type of surgery is being performed? LEFT HIP INJECTION   2. When is this surgery scheduled? TBD   3. What type of clearance is required (medical clearance vs. Pharmacy clearance to hold med vs. Both)? BOTH  4. Are there any medications that need to be held prior to surgery and how long? ELIQUIS-2 DAYS   5. Practice name and name of physician performing surgery? [facility]   6. What is your office phone number [phone]    7.   What is your office fax number  [phone]  8.   Anesthesia type (None, local, MAC, general) ? NOT LISTED   PLEASE NOTE IF AUTHORIZED FOR:  1-THIS INJECTION ONLY  2-THIS INJECTION AND ANY ADDITIONAL FOR THE NEXT 6 MONTHS  3-AUTHORIZATION NOT GRANTED

## 2019-03-26 NOTE — Telephone Encounter (Signed)
Patient with diagnosis of atrial fibrillation on Eliquis for anticoagulation.    Procedure: left hip injection Date of procedure: TBD  CHADS2-VASc score of  6 (CHF, HTN, AGE x 2, CAD, female)  CrCl 28.9 Platelet count 147  Per office protocol, patient can hold Eliquis for 2 days prior to procedure.    Patient should restart Eliquis on the evening of procedure or day after, at discretion of procedure MD

## 2019-03-26 NOTE — Telephone Encounter (Signed)
   Primary Cardiologist: Peter Martinique, MD  Chart reviewed as part of pre-operative protocol coverage. Given past medical history and time since last visit, based on ACC/AHA guidelines, [name] [name] would be at acceptable risk for the planned procedure without further cardiovascular testing. I have spoken to [name] [name] by phone today. She is not having any cardiac symptoms. She has had her first COVID vaccine and is sore but no new symptoms.   Per Pharmacy: Patient with diagnosis of atrial fibrillation on Eliquis for anticoagulation.  Procedure: left hip injection  Date of procedure: TBD  CHADS2-VASc score of 6 (CHF, HTN, AGE x 2, CAD, female)  CrCl 28.9  Platelet count 147  Per office protocol, patient can hold Eliquis for 2 days prior to procedure.  Patient should restart Eliquis on the evening of procedure or day after, at discretion of procedure MD    I will route this recommendation to the requesting party via [facility] fax function and remove from pre-op pool.  Please call with questions.  Phill Myron. Keyara Ent DNP, ANP, AACC  03/26/2019, 9:09 AM

## 2019-03-31 ENCOUNTER — Ambulatory Visit
Admission: RE | Admit: 2019-03-31 | Discharge: 2019-03-31 | Disposition: A | Discharge status: Home / Self Care | Payer: Medicare PPO | Source: Ambulatory Visit | Attending: Orthopedic Surgery | Admitting: Orthopedic Surgery

## 2019-03-31 ENCOUNTER — Other Ambulatory Visit: Payer: Self-pay

## 2019-03-31 DIAGNOSIS — M1612 Unilateral primary osteoarthritis, left hip: Secondary | ICD-10-CM

## 2019-03-31 MED ORDER — METHYLPREDNISOLONE ACETATE 40 MG/ML INJ SUSP (RADIOLOG
120.0000 mg | Freq: Once | INTRAMUSCULAR | Status: AC
  Administered 2019-03-31: 40 mg via INTRA_ARTICULAR

## 2019-03-31 MED ORDER — IOPAMIDOL (ISOVUE-M 200) INJECTION 41%
1.0000 mL | Freq: Once | INTRAMUSCULAR | Status: AC
  Administered 2019-03-31: 1 mL via INTRA_ARTICULAR

## 2019-04-10 ENCOUNTER — Other Ambulatory Visit: Payer: Self-pay

## 2019-04-10 MED ORDER — AMIODARONE HCL 200 MG PO TABS: 100.0000 mg | ORAL_TABLET | Freq: Every day | ORAL | 3 refills | Status: DC

## 2019-04-22 ENCOUNTER — Other Ambulatory Visit: Payer: Self-pay | Admitting: Cardiology

## 2019-04-23 ENCOUNTER — Other Ambulatory Visit: Payer: Self-pay | Admitting: Cardiology

## 2019-04-23 ENCOUNTER — Telehealth: Payer: Self-pay

## 2019-04-23 ENCOUNTER — Other Ambulatory Visit: Payer: Self-pay | Admitting: Orthopedic Surgery

## 2019-04-23 DIAGNOSIS — M1612 Unilateral primary osteoarthritis, left hip: Secondary | ICD-10-CM

## 2019-04-23 MED ORDER — PANTOPRAZOLE SODIUM 40 MG PO TBEC: 40.0000 mg | DELAYED_RELEASE_TABLET | Freq: Two times a day (BID) | ORAL | 3 refills | Status: DC

## 2019-04-23 NOTE — Telephone Encounter (Signed)
New Message    Pt is calling and says she would like to speak with Malachy Mood  Pt says her medications are still [place] the beach and needs a refill    *STAT* If patient is at the pharmacy, call can be transferred to refill team.   1. Which medications need to be refilled? (please list name of each medication and dose if known) pantoprazole (PROTONIX) 40 MG tablet  2. Which pharmacy/location (including street and city if local pharmacy) is medication to be sent to? [id number] - [place], [place] - [address] AT [address]  3. Do they need a 30 day or 90 day supply? 30 or 90      tantopracole 40 mg

## 2019-04-23 NOTE — Telephone Encounter (Signed)
Spoke to patient protonix refill sent to pharmacy.

## 2019-04-24 MED ORDER — PANTOPRAZOLE SODIUM 40 MG PO TBEC: 40.0000 mg | DELAYED_RELEASE_TABLET | Freq: Two times a day (BID) | ORAL | 3 refills | Status: DC

## 2019-04-28 ENCOUNTER — Other Ambulatory Visit: Payer: Self-pay

## 2019-04-28 ENCOUNTER — Ambulatory Visit
Admission: RE | Admit: 2019-04-28 | Discharge: 2019-04-28 | Disposition: A | Discharge status: Home / Self Care | Payer: Medicare PPO | Source: Ambulatory Visit | Attending: Orthopedic Surgery | Admitting: Orthopedic Surgery

## 2019-04-28 DIAGNOSIS — M1612 Unilateral primary osteoarthritis, left hip: Secondary | ICD-10-CM

## 2019-04-28 MED ORDER — METHYLPREDNISOLONE ACETATE 40 MG/ML INJ SUSP (RADIOLOG
120.0000 mg | Freq: Once | INTRAMUSCULAR | Status: AC
  Administered 2019-04-28: 15:00:00 80 mg via INTRA_ARTICULAR

## 2019-04-28 MED ORDER — IOPAMIDOL (ISOVUE-M 200) INJECTION 41%
1.0000 mL | Freq: Once | INTRAMUSCULAR | Status: AC
  Administered 2019-04-28: 15:00:00 1 mL via INTRA_ARTICULAR

## 2019-05-06 ENCOUNTER — Telehealth: Payer: Self-pay | Admitting: Cardiology

## 2019-05-06 NOTE — Telephone Encounter (Signed)
I spoke with the patient, she says she is allergic to Clindamycin as well, although not as bad as penicillin. (the symptom of allergy was not mentioned) She has been given 500mg  Azithromycin for SBE prophylaxis by her dentist.   Otherwise, she is aware of our recommendation regarding Eliquis.

## 2019-05-06 NOTE — Telephone Encounter (Signed)
Please confirm with requesting provider the number of teeth being extracted

## 2019-05-06 NOTE — Telephone Encounter (Signed)
Should not need to hold Eliquis for single extraction/implant. If dentist really wants pt to hold, would be ok to hold Eliquis for 1 day (she takes for afib with CHADS2VASc score of 21 (age x2, sex, CHF, HTN, CAD)).   Pt will need pre-op antibiotics due to history of TAVR. She has anaphylactic reaction to penicillin, so would recommend clindamycin 600mg  1 hour before procedure (may need to clarify if pt has some at home - rx is on her med list with a few refills but looks like rx was sent in back in 2018).

## 2019-05-06 NOTE — Telephone Encounter (Addendum)
Azithromycin 500mg  is an acceptable SBE ppx alternative in the setting of PCN and clinda allergies. I have removed clindamycin from her med list and added it to her allergy list.

## 2019-05-06 NOTE — Telephone Encounter (Signed)
I s/w [name] [name], DDS office and confirmed the pt will be having 1 tooth extracted as well as 1 implant to be done. I will inform pre op team of new information. Fax number is [phone].

## 2019-05-06 NOTE — Addendum Note (Signed)
Addended by: SUPPLE, MEGAN E on: 05/06/2019 03:22 PM   Modules accepted: Orders

## 2019-05-06 NOTE — Telephone Encounter (Signed)
Clinical pharmacist to review, single tooth extraction does not need to stop eliquis, but what about implant?

## 2019-05-06 NOTE — Telephone Encounter (Signed)
   [facility] Pre-operative Risk Assessment    Request for surgical clearance:  1. What type of surgery is being performed? Tooth extraction   2. When is this surgery scheduled? 05-11-19   3. What type of clearance is required (medical clearance vs. Pharmacy clearance to hold med vs. Both)? Pharmacy  4. Are there any medications that need to be held prior to surgery and how long?Eliquis TBD by the doctor.  5. Practice name and name of physician performing surgery? [facility]  6. What is your office phone number: [phone]    7.   What is your office fax number: pt is not sure  8.   Anesthesia type (None, local, MAC, general) ? Local Anesthesia (novicaine) most likely   When the patient needed clearance in the past it was 2 days before, but the pt just wanted to check in advance.   Johnna Acosta 05/06/2019, 8:57 AM  _________________________________________________________________   (provider comments below)

## 2019-05-20 ENCOUNTER — Telehealth: Payer: Self-pay

## 2019-05-20 NOTE — Telephone Encounter (Signed)
Unable to speak  with patient to remind of missed remote transmission 

## 2019-06-04 DIAGNOSIS — Z1231 Encounter for screening mammogram for malignant neoplasm of breast: Secondary | ICD-10-CM | POA: Diagnosis not present

## 2019-06-08 ENCOUNTER — Ambulatory Visit (INDEPENDENT_AMBULATORY_CARE_PROVIDER_SITE_OTHER): Payer: Medicare PPO | Admitting: *Deleted

## 2019-06-08 DIAGNOSIS — Z950 Presence of cardiac pacemaker: Secondary | ICD-10-CM

## 2019-06-08 LAB — CUP PACEART REMOTE DEVICE CHECK
Battery Impedance: 161 Ohm
Battery Remaining Longevity: 121 mo
Battery Voltage: 2.78 V
Brady Statistic AP VP Percent: 0 %
Brady Statistic AP VS Percent: 93 %
Brady Statistic AS VP Percent: 0 %
Brady Statistic AS VS Percent: 7 %
Date Time Interrogation Session: 20210405081957
Implantable Lead Implant Date: 20180807
Implantable Lead Implant Date: 20180807
Implantable Lead Location: 753859
Implantable Lead Location: 753860
Implantable Lead Model: 5076
Implantable Lead Model: 5076
Implantable Pulse Generator Implant Date: 20180807
Lead Channel Impedance Value: 310 Ohm
Lead Channel Impedance Value: 488 Ohm
Lead Channel Pacing Threshold Amplitude: 0.75 V
Lead Channel Pacing Threshold Amplitude: 0.75 V
Lead Channel Pacing Threshold Pulse Width: 0.4 ms
Lead Channel Pacing Threshold Pulse Width: 0.4 ms
Lead Channel Setting Pacing Amplitude: 2 V
Lead Channel Setting Pacing Amplitude: 2.5 V
Lead Channel Setting Pacing Pulse Width: 0.4 ms
Lead Channel Setting Sensing Sensitivity: 5.6 mV

## 2019-06-10 NOTE — Progress Notes (Signed)
PPM Remote  

## 2019-06-11 ENCOUNTER — Telehealth: Payer: Self-pay | Admitting: Internal Medicine

## 2019-06-11 NOTE — Telephone Encounter (Signed)
Patient returned your call. She will be out for an hour or so, please call back after 4pm.

## 2019-06-11 NOTE — Telephone Encounter (Signed)
Left message for patient to call.  Chanetta Marshall, NP 06/11/2019 12:44 PM

## 2019-06-11 NOTE — Telephone Encounter (Signed)
°  1. Has your device fired? No   2. Is you device beeping? No   3. Are you experiencing draining or swelling at device site? No   4. Are you calling to see if we received your device transmission? No   5. Have you passed out? No   [name] [name] is calling stating she is experiencing soreness around the device sight and it also lumpy. Please advise.    Please route to [place]

## 2019-06-12 NOTE — Telephone Encounter (Signed)
Spoke with pt.  She states that for a couple of weeks now she has noticed a knot below the PM she is not sure that it is PM, but she did have a mammogram sicne and it is not breast tissue.  Last PM check on 4/5 indicates normal function.  Offered APP visit next week, pt would rather wait until appt with Dr. [name] in May.

## 2019-06-15 DIAGNOSIS — M1990 Unspecified osteoarthritis, unspecified site: Secondary | ICD-10-CM | POA: Diagnosis not present

## 2019-06-15 DIAGNOSIS — R531 Weakness: Secondary | ICD-10-CM | POA: Diagnosis not present

## 2019-06-15 DIAGNOSIS — R627 Adult failure to thrive: Secondary | ICD-10-CM | POA: Diagnosis not present

## 2019-06-15 DIAGNOSIS — I5030 Unspecified diastolic (congestive) heart failure: Secondary | ICD-10-CM | POA: Diagnosis not present

## 2019-06-15 DIAGNOSIS — M810 Age-related osteoporosis without current pathological fracture: Secondary | ICD-10-CM | POA: Diagnosis not present

## 2019-06-15 DIAGNOSIS — Z952 Presence of prosthetic heart valve: Secondary | ICD-10-CM | POA: Diagnosis not present

## 2019-06-15 DIAGNOSIS — I129 Hypertensive chronic kidney disease with stage 1 through stage 4 chronic kidney disease, or unspecified chronic kidney disease: Secondary | ICD-10-CM | POA: Diagnosis not present

## 2019-06-15 DIAGNOSIS — I2510 Atherosclerotic heart disease of native coronary artery without angina pectoris: Secondary | ICD-10-CM | POA: Diagnosis not present

## 2019-06-15 DIAGNOSIS — R0789 Other chest pain: Secondary | ICD-10-CM | POA: Diagnosis not present

## 2019-07-27 ENCOUNTER — Other Ambulatory Visit: Payer: Self-pay | Admitting: Cardiology

## 2019-07-29 ENCOUNTER — Encounter: Payer: Self-pay | Admitting: Internal Medicine

## 2019-07-29 ENCOUNTER — Other Ambulatory Visit: Payer: Self-pay

## 2019-07-29 ENCOUNTER — Encounter (INDEPENDENT_AMBULATORY_CARE_PROVIDER_SITE_OTHER): Payer: Self-pay

## 2019-07-29 ENCOUNTER — Ambulatory Visit: Payer: Medicare PPO | Admitting: Internal Medicine

## 2019-07-29 VITALS — BP 142/76 | HR 86 | Ht 65.0 in | Wt 129.0 lb

## 2019-07-29 DIAGNOSIS — I350 Nonrheumatic aortic (valve) stenosis: Secondary | ICD-10-CM | POA: Diagnosis not present

## 2019-07-29 DIAGNOSIS — I495 Sick sinus syndrome: Secondary | ICD-10-CM

## 2019-07-29 DIAGNOSIS — I4819 Other persistent atrial fibrillation: Secondary | ICD-10-CM

## 2019-07-29 DIAGNOSIS — I119 Hypertensive heart disease without heart failure: Secondary | ICD-10-CM | POA: Diagnosis not present

## 2019-07-29 DIAGNOSIS — Z950 Presence of cardiac pacemaker: Secondary | ICD-10-CM | POA: Diagnosis not present

## 2019-07-29 DIAGNOSIS — D6869 Other thrombophilia: Secondary | ICD-10-CM

## 2019-07-29 NOTE — Progress Notes (Signed)
PCP: [name], MD Primary Cardiologist: Dr Martinique Primary EP:  Dr Aurea Graff [name] [name] is a 86 y.o. female who presents today for routine electrophysiology followup.  Since last being seen in our clinic, the patient reports doing very well.  Today, she denies symptoms of palpitations, chest pain, shortness of breath,  lower extremity edema, dizziness, presyncope, or syncope.  The patient is otherwise without complaint today.   Past Medical History:  Diagnosis Date  . CKD (chronic kidney disease)   . Complication of anesthesia   . Coronary artery disease    a. 12/20/2016: s/p DES to LAD   . Diverticulosis   . GERD (gastroesophageal reflux disease)   . Hemorrhoids   . Hyperlipidemia   . Hypertension   . Hypothyroidism   . Osteoarthritis   . Paroxysmal atrial fibrillation (HCC)    a. on [place] with Xarelto but switched to Eliquis after stenting.   Marland Kitchen PONV (postoperative nausea and vomiting)   . Presence of permanent cardiac pacemaker   . S/P TAVR (transcatheter aortic valve replacement) 01/29/2017   23 mm Edwards Sapien 3 transcatheter heart valve placed via percutaneous right transfemoral approach    Past Surgical History:  Procedure Laterality Date  . ABDOMINAL HYSTERECTOMY  1980  . BASAL CELL CARCINOMA EXCISION    . CARDIAC CATHETERIZATION  ~ 11/2016  . CATARACT EXTRACTION W/ INTRAOCULAR LENS  IMPLANT, BILATERAL Bilateral   . CORONARY ANGIOPLASTY WITH STENT PLACEMENT  12/20/2016   "1 stent"  . CORONARY STENT INTERVENTION N/A 12/20/2016   Procedure: CORONARY STENT INTERVENTION;  Surgeon: Martinique, Peter M, MD;  Location: [place] [facility];  Service: Cardiovascular;  Laterality: N/A;  . FINGER SURGERY Left    "fell; developed skiers thumb; had to operate on it"  . INSERT / REPLACE / [place]   for syptomatic bradycardia and syncope -- in [facility]  . INTRAMEDULLARY (IM) NAIL INTERTROCHANTERIC Left 10/19/2018   Procedure: INTRAMEDULLARY (IM) NAIL  INTERTROCHANTRIC;  Surgeon: Altamese [place], MD;  Location: [place];  Service: Orthopedics;  Laterality: Left;  . LEAD REVISION/REPAIR N/A 10/09/2016   New left subclavian MDT Adapta L PPM dual chamber system implanted by Dr [name] with previously placed R subclavian system abandoned  . PACEMAKER GENERATOR CHANGE  2001   pulse generator replacement by Dr. Rollene Fare   . PACEMAKER GENERATOR CHANGE  04/01/2008   PPM Medtronic -- model # [id number] serial # [id number] -- pulse generator replacement by Dr. Verlon Setting   . RIGHT/LEFT HEART CATH AND CORONARY ANGIOGRAPHY N/A 11/27/2016   Procedure: RIGHT/LEFT HEART CATH AND CORONARY ANGIOGRAPHY;  Surgeon: Martinique, Peter M, MD;  Location: [place] [facility];  Service: Cardiovascular;  Laterality: N/A;  . SQUAMOUS CELL CARCINOMA EXCISION    . TEE WITHOUT CARDIOVERSION N/A 01/29/2017   Procedure: TRANSESOPHAGEAL ECHOCARDIOGRAM (TEE);  Surgeon: Sherren Mocha, MD;  Location: [place];  Service: Open Heart Surgery;  Laterality: N/A;  . TONSILLECTOMY    . TRANSCATHETER AORTIC VALVE REPLACEMENT, TRANSFEMORAL N/A 01/29/2017   Procedure: TRANSCATHETER AORTIC VALVE REPLACEMENT, TRANSFEMORAL;  Surgeon: Sherren Mocha, MD;  Location: [place];  Service: Open Heart Surgery;  Laterality: N/A;    ROS- all systems are reviewed and negative except as per HPI above  Current Outpatient Medications  Medication Sig Dispense Refill  . acetaminophen (TYLENOL) 325 MG tablet Take 2 tablets (650 mg total) by mouth every 6 (six) hours. 60 tablet 0  . alum & mag hydroxide-simeth (MAALOX/MYLANTA) 200-200-20 MG/5ML suspension Take  30 mLs by mouth every 4 (four) hours as needed for indigestion or heartburn. 355 mL 0  . amiodarone (PACERONE) 200 MG tablet Take 0.5 tablets (100 mg total) by mouth daily. 45 tablet 3  . calcium carbonate (OS-CAL) 600 MG TABS Take 600 mg by mouth 2 (two) times daily with a meal.     . cholecalciferol (VITAMIN D) 25 MCG tablet Take 2 tablets (2,000 Units total) by mouth 2  (two) times daily. 60 tablet 2  . dicyclomine (BENTYL) 10 MG capsule Take 10 mg by mouth 2 (two) times daily as needed for spasms.   1  . docusate sodium (COLACE) 100 MG capsule Take 1 capsule (100 mg total) by mouth 2 (two) times daily. 10 capsule 0  . ELIQUIS 2.5 MG TABS tablet TAKE 1 TABLET(2.5 MG) BY MOUTH TWICE DAILY 180 tablet 1  . ezetimibe (ZETIA) 10 MG tablet Take 1 tablet (10 mg total) by mouth daily. 90 tablet 3  . feeding supplement, ENSURE ENLIVE, (ENSURE ENLIVE) LIQD Take 237 mLs by mouth 2 (two) times daily between meals. 237 mL 12  . fluticasone (FLONASE) 50 MCG/ACT nasal spray Place 2 sprays into both nostrils as needed. Sinus clog    . furosemide (LASIX) 20 MG tablet TAKE 1 TABLET BY MOUTH EVERY DAY; EXCEPT ON MONDAY AND FRIDAY 90 tablet 3  . loratadine (CLARITIN) 10 MG tablet Take 10 mg by mouth daily as needed for allergies.    Marland Kitchen losartan (COZAAR) 25 MG tablet Take 1 tablet (25 mg total) by mouth daily. 90 tablet 3  . metoprolol tartrate (LOPRESSOR) 25 MG tablet TAKE 1 AND 1/2 TABLETS(37.5 MG) BY MOUTH TWICE DAILY 270 tablet 2  . Multiple Vitamins-Minerals (PRESERVISION AREDS 2) CAPS Take 1 capsule by mouth 2 (two) times daily.    . nitroGLYCERIN (NITROSTAT) 0.4 MG SL tablet Place 1 tablet (0.4 mg total) under the tongue every 5 (five) minutes as needed for chest pain. 25 tablet 12  . pantoprazole (PROTONIX) 40 MG tablet Take 1 tablet (40 mg total) by mouth 2 (two) times daily before a meal. 90 tablet 3  . pantoprazole (PROTONIX) 40 MG tablet Take 1 tablet (40 mg total) by mouth 2 (two) times daily before a meal. 180 tablet 3  . polyethylene glycol (MIRALAX / GLYCOLAX) 17 g packet Take 17 g by mouth 2 (two) times daily. 14 each 0  . polyvinyl alcohol (LIQUIFILM TEARS) 1.4 % ophthalmic solution Place 1-2 drops into both eyes 3 (three) times daily as needed for dry eyes.    . sodium chloride (OCEAN) 0.65 % SOLN nasal spray Place 1 spray into both nostrils as needed for congestion.     Marland Kitchen SYNTHROID 125 MCG tablet Take 125 mcg by mouth daily before breakfast.     . amLODipine (NORVASC) 5 MG tablet Take 1 tablet (5 mg total) by mouth daily. 90 tablet 3   No current facility-administered medications for this visit.    Physical Exam: Vitals:   07/29/19 1405  BP: (!) 142/76  Pulse: 86  SpO2: 96%  Weight: 129 lb (58.5 kg)  Height: 5\' 5"  (1.651 m)    GEN- The patient is elderly appearing, alert and oriented x 3 today.   Head- normocephalic, atraumatic Eyes-  Sclera clear, conjunctiva pink Ears- hearing intact Oropharynx- clear Lungs- Clear to ausculation bilaterally, normal work of breathing Chest- pacemaker pocket is well healed Heart- Regular rate and rhythm, no murmurs, rubs or gallops, PMI not laterally displaced GI- soft, NT,  ND, + BS Extremities- no clubbing, cyanosis, or edema  Pacemaker interrogation- reviewed in detail today,  See PACEART report  ekg tracing ordered today is personally reviewed and shows sinus with first degree AV block  Assessment and Plan:  1. Symptomatic sinus bradycardia  Normal pacemaker function See Pace Art report No changes today she is device dependant today in the [facility]  2. Persistent afib Maintaining sinus with amiodarone 100mg  daily Labs from 12/2019 reviewed.  Her thyroid replacement is followed closely by Dr Virgina Jock and has been recently adjusted per patient. She reports that he is following her LFTs also. We discussed the importance of close monitoring on amiodarone to avoid toxicity.  I have offered to stop her amiodarone (No afib in over a year by [facility] interrogation) or reduce to 50mg  daily.  She states "If it aint broke, lets not try to fix it".  She prefers to make no changes today. chads2vasc score is at least 5.  She is on eliquis  3. Hypertensive cardiovascular disease Stable No change required today  4. S/p TAVR Stable No change required today  5. CAD No ischemic symptoms  6. HL Continue zetia  7.  Chronic diastolic dysfunction Stable No change required today  Risks, benefits and potential toxicities for medications prescribed and/or refilled reviewed with patient today.   Return to see EP PA in 6 months  Thompson Grayer MD, [facility] 07/29/2019 2:15 PM

## 2019-07-29 NOTE — Patient Instructions (Addendum)
Medication Instructions:  Your physician recommends that you continue on your current medications as directed. Please refer to the Current Medication list given to you today.  Labwork: None ordered.  Testing/Procedures: None ordered.  Follow-Up:  Your physician wants you to follow-up in: 6 months with the afib clinic.  You will receive a reminder letter in the mail two months in advance. If you don't receive a letter, please call our office to schedule the follow-up appointment.  Your physician wants you to follow-up in: one year with Dr. [name].   You will receive a reminder letter in the mail two months in advance. If you don't receive a letter, please call our office to schedule the follow-up appointment.  Remote monitoring is used to monitor your Pacemaker from home. This monitoring reduces the number of office visits required to check your device to one time per year. It allows [place] to keep an eye on the functioning of your device to ensure it is working properly. You are scheduled for a device check from home on 09/09/2019. You may send your transmission at any time that day. If you have a wireless device, the transmission will be sent automatically. After your physician reviews your transmission, you will receive a postcard with your next transmission date.  Any Other Special Instructions Will Be Listed Below (If Applicable).  If you need a refill on your cardiac medications before your next appointment, please call your pharmacy.

## 2019-08-12 DIAGNOSIS — S72142D Displaced intertrochanteric fracture of left femur, subsequent encounter for closed fracture with routine healing: Secondary | ICD-10-CM | POA: Diagnosis not present

## 2019-08-12 DIAGNOSIS — M25552 Pain in left hip: Secondary | ICD-10-CM | POA: Diagnosis not present

## 2019-08-12 DIAGNOSIS — M1712 Unilateral primary osteoarthritis, left knee: Secondary | ICD-10-CM | POA: Diagnosis not present

## 2019-08-14 NOTE — Progress Notes (Signed)
[name] [name] Date of Birth: Jul 28, 1929 Medical Record #[id number]  History of Present Illness: [name] is seen today for follow up CAD and AV stenosis. She is s/p  PCI with DES to LAD on December 20, 2016.   She has a history of paroxysmal atrial fibrillation with tachy-brady syndrome and is s/p pacemaker implant. She is on chronic anticoagulation with Eliquis. Echo in July 2015 showed moderate aortic stenosis that had progressed since 2012. Repeat in July 2016 and July 2017 showed no change. In April 2017 she had atrial fibrillation that converted spontaneously. This was felt to be triggered by a steroid injection. Her metoprolol was increased at that time. When seen in June 2017 she was noted to be in persistent Afib. We placed her on Flecainide but this was later stopped due to side effects. She was seen in November with recurrent and persistent Afib. Rate was controlled but she was symptomatic. Seen in Afib clinic and started on amiodarone with subsequent conversion to NSR.   She later developed symptoms of dyspnea.   She did have a upper and lower EGD which was negative except for few polyps. She had PFTs in December that showed mild obstructive defect with marked decrease in diffusion capacity. CT of the Abdomen was negative for any pathology. She reports Dr. Virgina Jock did an [place] as well.  Myoview study was normal. When seen by Dr. [name] in late July it was noted that she had failure of her RA lead. Her device was reprogrammed but she developed pectoral stimulation. She underwent explantation of her old generator and placement of a new left chest dual chamber pacemaker on October 09 2016. Because of her ongoing symptoms we recommended a right and left heart cath and this was done on 11/27/16. This demonstrated severe AS as well as a severe stenosis in the mid LAD. She subsequently underwent successful PCI of the LAD with DES on 12/20/16 with 2.75 x 16 mm Promus stent.   She underwent TAVR on November  27,2018  with a #23 Edwards Sapien valve. Post op course complicated by pericarditis and AFib with RVR she was treated with anti-inflammatories and Amiodarone. Later Eliquis resumed and ASA stopped. Still on Plavix for stent. Follow up Echo was [place]. Repeat Echo 01/22/18 still showed good results. Pacemaker check on 12/12/17 showed normal function without atrial high rate episodes. When seen in the valve clinic on 01/22/18 Plavix was discontinued.   In August 2020 she was admitted after mechanical fall and had hip surgery. She had blood loss anemia and acute kidney injury that improved. BP was low and medication reduced but later resumed. She has since completed Rehab therapy at [facility] and  in home therapy. She has done well except has developed pain in her left hip area. This was improved with an injection. She denies any chest pain, SOB, palpitations. She does tire more easily and takes a nap now. Has mild ankle swelling that goes down at night.  Last pacemaker check  in April was normal.   Current Outpatient Medications on File Prior to Visit  Medication Sig Dispense Refill   acetaminophen (TYLENOL) 325 MG tablet Take 2 tablets (650 mg total) by mouth every 6 (six) hours. 60 tablet 0   alum & mag hydroxide-simeth (MAALOX/MYLANTA) 200-200-20 MG/5ML suspension Take 30 mLs by mouth every 4 (four) hours as needed for indigestion or heartburn. 355 mL 0   amiodarone (PACERONE) 200 MG tablet Take 0.5 tablets (100 mg total) by mouth  daily. 45 tablet 3   amLODipine (NORVASC) 5 MG tablet Take 1 tablet (5 mg total) by mouth daily. 90 tablet 3   calcium carbonate (OS-CAL) 600 MG TABS Take 600 mg by mouth 2 (two) times daily with a meal.      cholecalciferol (VITAMIN D) 25 MCG tablet Take 2 tablets (2,000 Units total) by mouth 2 (two) times daily. 60 tablet 2   dicyclomine (BENTYL) 10 MG capsule Take 10 mg by mouth 2 (two) times daily as needed for spasms.   1   docusate sodium (COLACE) 100 MG  capsule Take 1 capsule (100 mg total) by mouth 2 (two) times daily. 10 capsule 0   ELIQUIS 2.5 MG TABS tablet TAKE 1 TABLET(2.5 MG) BY MOUTH TWICE DAILY 180 tablet 1   ezetimibe (ZETIA) 10 MG tablet Take 1 tablet (10 mg total) by mouth daily. 90 tablet 3   feeding supplement, ENSURE ENLIVE, (ENSURE ENLIVE) LIQD Take 237 mLs by mouth 2 (two) times daily between meals. 237 mL 12   fluticasone (FLONASE) 50 MCG/ACT nasal spray Place 2 sprays into both nostrils as needed. Sinus clog     furosemide (LASIX) 20 MG tablet TAKE 1 TABLET BY MOUTH EVERY DAY; EXCEPT ON MONDAY AND FRIDAY 90 tablet 3   loratadine (CLARITIN) 10 MG tablet Take 10 mg by mouth daily as needed for allergies.     losartan (COZAAR) 25 MG tablet Take 1 tablet (25 mg total) by mouth daily. 90 tablet 3   metoprolol tartrate (LOPRESSOR) 25 MG tablet TAKE 1 AND 1/2 TABLETS(37.5 MG) BY MOUTH TWICE DAILY 270 tablet 2   Multiple Vitamins-Minerals (PRESERVISION AREDS 2) CAPS Take 1 capsule by mouth 2 (two) times daily.     nitroGLYCERIN (NITROSTAT) 0.4 MG SL tablet Place 1 tablet (0.4 mg total) under the tongue every 5 (five) minutes as needed for chest pain. 25 tablet 12   pantoprazole (PROTONIX) 40 MG tablet Take 1 tablet (40 mg total) by mouth 2 (two) times daily before a meal. 90 tablet 3   pantoprazole (PROTONIX) 40 MG tablet Take 1 tablet (40 mg total) by mouth 2 (two) times daily before a meal. 180 tablet 3   polyethylene glycol (MIRALAX / GLYCOLAX) 17 g packet Take 17 g by mouth 2 (two) times daily. 14 each 0   polyvinyl alcohol (LIQUIFILM TEARS) 1.4 % ophthalmic solution Place 1-2 drops into both eyes 3 (three) times daily as needed for dry eyes.     sodium chloride (OCEAN) 0.65 % SOLN nasal spray Place 1 spray into both nostrils as needed for congestion.     SYNTHROID 125 MCG tablet Take 125 mcg by mouth daily before breakfast.      No current facility-administered medications on file prior to visit.    Allergies   Allergen Reactions   Penicillins Anaphylaxis and Other (See Comments)    Has patient had a PCN reaction causing immediate rash, facial/tongue/throat swelling, SOB or lightheadedness with hypotension: Yes Has patient had a PCN reaction causing severe rash involving mucus membranes or skin necrosis: No Has patient had a PCN reaction that required hospitalization: No Has patient had a PCN reaction occurring within the last 10 years: No If all of the above answers are "NO", then may proceed with Cephalosporin use.    Tetanus Toxoid Anaphylaxis   Demerol Other (See Comments)    Severe nausea   Clindamycin/Lincomycin    Codeine Nausea Only and Other (See Comments)    Severe nausea  Past Medical History:  Diagnosis Date   CKD (chronic kidney disease)    Complication of anesthesia    Coronary artery disease    a. 12/20/2016: s/p DES to LAD    Diverticulosis    GERD (gastroesophageal reflux disease)    Hemorrhoids    Hyperlipidemia    Hypertension    Hypothyroidism    Osteoarthritis    Paroxysmal atrial fibrillation (HCC)    a. on [place] with Xarelto but switched to Eliquis after stenting.    PONV (postoperative nausea and vomiting)    Presence of permanent cardiac pacemaker    S/P TAVR (transcatheter aortic valve replacement) 01/29/2017   23 mm Edwards Sapien 3 transcatheter heart valve placed via percutaneous right transfemoral approach     Past Surgical History:  Procedure Laterality Date   ABDOMINAL HYSTERECTOMY  1980   BASAL CELL CARCINOMA EXCISION     CARDIAC CATHETERIZATION  ~ 11/2016   CATARACT EXTRACTION W/ INTRAOCULAR LENS  IMPLANT, BILATERAL Bilateral    CORONARY ANGIOPLASTY WITH STENT PLACEMENT  12/20/2016   "1 stent"   CORONARY STENT INTERVENTION N/A 12/20/2016   Procedure: CORONARY STENT INTERVENTION;  Surgeon: Martinique, Jamarcus Laduke M, MD;  Location: [place] [facility];  Service: Cardiovascular;  Laterality: N/A;   FINGER SURGERY Left    "fell;  developed skiers thumb; had to operate on it"   INSERT / REPLACE / [place]   for syptomatic bradycardia and syncope -- in [place] (IM) NAIL INTERTROCHANTERIC Left 10/19/2018   Procedure: INTRAMEDULLARY (IM) NAIL INTERTROCHANTRIC;  Surgeon: Altamese [place], MD;  Location: [place];  Service: Orthopedics;  Laterality: Left;   LEAD REVISION/REPAIR N/A 10/09/2016   New left subclavian MDT Adapta L PPM dual chamber system implanted by Dr [name] with previously placed R subclavian system abandoned   PACEMAKER GENERATOR CHANGE  2001   pulse generator replacement by Dr. Pincus Large GENERATOR CHANGE  04/01/2008   PPM Medtronic -- model # ADDRL1 serial # [id number] -- pulse generator replacement by Dr. Verlon Setting    RIGHT/LEFT [place] N/A 11/27/2016   Procedure: RIGHT/LEFT HEART CATH AND CORONARY ANGIOGRAPHY;  Surgeon: Martinique, [place] Bre[place] M, MD;  Location: [place] [facility];  Service: Cardiovascular;  Laterality: N/A;   SQUAMOUS CELL CARCINOMA EXCISION     TEE WITHOUT CARDIOVERSION N/A 01/29/2017   Procedure: TRANSESOPHAGEAL ECHOCARDIOGRAM (TEE);  Surgeon: Sherren Mocha, MD;  Location: [place];  Service: Open Heart Surgery;  Laterality: N/A;   TONSILLECTOMY     TRANSCATHETER AORTIC VALVE REPLACEMENT, TRANSFEMORAL N/A 01/29/2017   Procedure: TRANSCATHETER AORTIC VALVE REPLACEMENT, TRANSFEMORAL;  Surgeon: Sherren Mocha, MD;  Location: [place];  Service: Open Heart Surgery;  Laterality: N/A;    Social History   Tobacco Use  Smoking Status Former Smoker   Packs/day: 1.50   Years: 30.00   Pack years: 45.00   Types: Cigarettes   Quit date: 03/05/1978   Years since quitting: 41.4  Smokeless Tobacco Never Used    Social History   Substance and Sexual Activity  Alcohol Use No    Family History  Problem Relation Age of Onset   Cancer Father        lung cancer   Alzheimer's disease Mother     Review of Systems: As  noted in history of present illness.  All other systems were reviewed and are negative.  Physical Exam: BP 124/62    Pulse 85    Ht 5\' 5"  (  1.651 m)    Wt 129 lb (58.5 kg)    SpO2 95%    BMI 21.47 kg/m  GENERAL:  Well appearing, elderly WF in NAD. Walks with walker.  HEENT:  PERRL, EOMI, sclera are clear. Oropharynx is clear. NECK:  No jugular venous distention, carotid upstroke brisk and symmetric, no bruits, no thyromegaly or adenopathy LUNGS:  Clear to auscultation bilaterally CHEST:  Unremarkable HEART:  RRR,  PMI not displaced or sustained,S1 and S2 within normal limits, no S3, no S4: no clicks, no rubs, very soft systolic murmur at the apex. ABD:  Soft, nontender. BS +, no masses or bruits. No hepatomegaly, no splenomegaly EXT:  2 + pulses throughout, tr edema, no cyanosis no clubbing SKIN:  Warm and dry.  No rashes NEURO:  Alert and oriented x 3. Cranial nerves II through XII intact. PSYCH:  Cognitively intact      LABORATORY DATA:  Lab Results  Component Value Date   WBC 7.1 10/23/2018   HGB 8.5 (L) 10/23/2018   HCT 26.0 (L) 10/23/2018   PLT 147 (L) 10/23/2018   GLUCOSE 135 (H) 12/05/2018   CHOL 219 (H) 02/05/2018   TRIG 70 02/05/2018   HDL 103 02/05/2018   LDLCALC 102 (H) 02/05/2018   ALT 7 12/05/2018   AST 19 12/05/2018   NA 140 12/05/2018   K 4.1 12/05/2018   CL 101 12/05/2018   CREATININE 1.23 (H) 12/05/2018   BUN 30 (H) 12/05/2018   CO2 26 12/05/2018   TSH 15.700 (H) 12/11/2018   INR 1.23 01/29/2017   HGBA1C 5.5 01/28/2017   Labs dated 07/15/15: cholesterol 229, triglycerides 111, HDL 76, LDL 131.  Dated 07/19/16: cholesterol 247, triglycerides 71, HDL 88, LDL 145. BUN 28, creatinine 1.3. Other chemistries, CBC and TSH normal. Dated 03/15/17: creatinine 1.7, Hgb 11.3. Dated 10/22/17: cholesterol 251, triglycerides 66, HDL 93, LDL 145. Hgb 11.7, creatinine 1.3. TSH and ALT normal. Dated 01/12/19: cholesterol 224, triglycerides 105, HDL 96, LDL 107.  Dated  06/15/19: BUN 36, creatinine 1.5. CMET otherwise normal. CBC and TSH normal.    Assessment / Plan: 1. Atrial fibrillation with sick sinus syndrome. Prior history of  persistent AFib.  Now on amiodarone 100 mg daily. Maintaining NSR. Continue long-term anticoagulation. On  Eliquis 2.5 mg bid.  Dose adjusted for age and renal function.   2. Aortic stenosis-   symptomatic. Now s/p TAVR. Stable exam.  3. CAD with significant single vessel obstructive disease in the mid LAD. S/p PCI of the LAD with DES October 2018.  On   Eliquis. No ASA due to bleeding risk.  4. HTN- controlled.  5. Hyperlipidemia. Intolerant of statins. Now on Zetia 10 mg daily.   6. Chronic diastolic CHF. She is well compensated. Minimal edema.  7. S/p femoral neck fracture with ORIF.   Follow up in 6 months.

## 2019-08-17 ENCOUNTER — Other Ambulatory Visit: Payer: Self-pay

## 2019-08-17 ENCOUNTER — Encounter: Payer: Self-pay | Admitting: Cardiology

## 2019-08-17 ENCOUNTER — Ambulatory Visit: Payer: Medicare PPO | Admitting: Cardiology

## 2019-08-17 VITALS — BP 124/62 | HR 85 | Ht 65.0 in | Wt 129.0 lb

## 2019-08-17 DIAGNOSIS — Z952 Presence of prosthetic heart valve: Secondary | ICD-10-CM

## 2019-08-17 DIAGNOSIS — I2510 Atherosclerotic heart disease of native coronary artery without angina pectoris: Secondary | ICD-10-CM

## 2019-08-17 DIAGNOSIS — I10 Essential (primary) hypertension: Secondary | ICD-10-CM | POA: Diagnosis not present

## 2019-08-17 DIAGNOSIS — I350 Nonrheumatic aortic (valve) stenosis: Secondary | ICD-10-CM | POA: Diagnosis not present

## 2019-08-17 DIAGNOSIS — I495 Sick sinus syndrome: Secondary | ICD-10-CM | POA: Diagnosis not present

## 2019-08-17 DIAGNOSIS — I480 Paroxysmal atrial fibrillation: Secondary | ICD-10-CM

## 2019-08-18 ENCOUNTER — Encounter (HOSPITAL_COMMUNITY): Payer: Medicare PPO

## 2019-08-28 ENCOUNTER — Other Ambulatory Visit (HOSPITAL_COMMUNITY): Payer: Self-pay | Admitting: *Deleted

## 2019-08-31 ENCOUNTER — Inpatient Hospital Stay (HOSPITAL_COMMUNITY): Admission: RE | Admit: 2019-08-31 | Payer: Medicare PPO | Source: Ambulatory Visit

## 2019-09-09 ENCOUNTER — Ambulatory Visit (INDEPENDENT_AMBULATORY_CARE_PROVIDER_SITE_OTHER): Payer: Medicare PPO | Admitting: *Deleted

## 2019-09-09 DIAGNOSIS — I495 Sick sinus syndrome: Secondary | ICD-10-CM | POA: Diagnosis not present

## 2019-09-09 LAB — CUP PACEART INCLINIC DEVICE CHECK
Battery Impedance: 185 Ohm
Battery Remaining Longevity: 116 mo
Battery Voltage: 2.78 V
Brady Statistic AP VP Percent: 0 %
Brady Statistic AP VS Percent: 94 %
Brady Statistic AS VP Percent: 0 %
Brady Statistic AS VS Percent: 6 %
Date Time Interrogation Session: 20210526142947
Implantable Lead Implant Date: 20180807
Implantable Lead Implant Date: 20180807
Implantable Lead Location: 753859
Implantable Lead Location: 753860
Implantable Lead Model: 5076
Implantable Lead Model: 5076
Implantable Pulse Generator Implant Date: 20180807
Lead Channel Impedance Value: 310 Ohm
Lead Channel Impedance Value: 518 Ohm
Lead Channel Pacing Threshold Amplitude: 0.75 V
Lead Channel Pacing Threshold Amplitude: 0.75 V
Lead Channel Pacing Threshold Amplitude: 0.75 V
Lead Channel Pacing Threshold Amplitude: 1 V
Lead Channel Pacing Threshold Pulse Width: 0.4 ms
Lead Channel Pacing Threshold Pulse Width: 0.4 ms
Lead Channel Pacing Threshold Pulse Width: 0.4 ms
Lead Channel Pacing Threshold Pulse Width: 0.4 ms
Lead Channel Setting Pacing Amplitude: 2 V
Lead Channel Setting Pacing Amplitude: 2.5 V
Lead Channel Setting Pacing Pulse Width: 0.4 ms
Lead Channel Setting Sensing Sensitivity: 5.6 mV

## 2019-09-10 LAB — CUP PACEART REMOTE DEVICE CHECK
Battery Impedance: 185 Ohm
Battery Remaining Longevity: 115 mo
Battery Voltage: 2.78 V
Brady Statistic AP VP Percent: 0 %
Brady Statistic AP VS Percent: 100 %
Brady Statistic AS VP Percent: 0 %
Brady Statistic AS VS Percent: 0 %
Date Time Interrogation Session: 20210708120530
Implantable Lead Implant Date: 20180807
Implantable Lead Implant Date: 20180807
Implantable Lead Location: 753859
Implantable Lead Location: 753860
Implantable Lead Model: 5076
Implantable Lead Model: 5076
Implantable Pulse Generator Implant Date: 20180807
Lead Channel Impedance Value: 325 Ohm
Lead Channel Impedance Value: 527 Ohm
Lead Channel Pacing Threshold Amplitude: 0.75 V
Lead Channel Pacing Threshold Amplitude: 0.75 V
Lead Channel Pacing Threshold Pulse Width: 0.4 ms
Lead Channel Pacing Threshold Pulse Width: 0.4 ms
Lead Channel Setting Pacing Amplitude: 2 V
Lead Channel Setting Pacing Amplitude: 2.5 V
Lead Channel Setting Pacing Pulse Width: 0.4 ms
Lead Channel Setting Sensing Sensitivity: 5.6 mV

## 2019-09-11 NOTE — Progress Notes (Signed)
Remote pacemaker transmission.   

## 2019-09-21 DIAGNOSIS — H0015 Chalazion left lower eyelid: Secondary | ICD-10-CM | POA: Diagnosis not present

## 2019-09-21 DIAGNOSIS — H0100A Unspecified blepharitis right eye, upper and lower eyelids: Secondary | ICD-10-CM | POA: Diagnosis not present

## 2019-09-21 DIAGNOSIS — H0011 Chalazion right upper eyelid: Secondary | ICD-10-CM | POA: Diagnosis not present

## 2019-09-21 DIAGNOSIS — H0012 Chalazion right lower eyelid: Secondary | ICD-10-CM | POA: Diagnosis not present

## 2019-10-14 DIAGNOSIS — I872 Venous insufficiency (chronic) (peripheral): Secondary | ICD-10-CM | POA: Diagnosis not present

## 2019-10-14 DIAGNOSIS — S81801A Unspecified open wound, right lower leg, initial encounter: Secondary | ICD-10-CM | POA: Diagnosis not present

## 2019-10-16 DIAGNOSIS — I2510 Atherosclerotic heart disease of native coronary artery without angina pectoris: Secondary | ICD-10-CM | POA: Diagnosis not present

## 2019-10-16 DIAGNOSIS — M1991 Primary osteoarthritis, unspecified site: Secondary | ICD-10-CM | POA: Diagnosis not present

## 2019-10-16 DIAGNOSIS — I10 Essential (primary) hypertension: Secondary | ICD-10-CM | POA: Diagnosis not present

## 2019-10-16 DIAGNOSIS — S81801D Unspecified open wound, right lower leg, subsequent encounter: Secondary | ICD-10-CM | POA: Diagnosis not present

## 2019-10-16 DIAGNOSIS — I872 Venous insufficiency (chronic) (peripheral): Secondary | ICD-10-CM | POA: Diagnosis not present

## 2019-10-16 DIAGNOSIS — R569 Unspecified convulsions: Secondary | ICD-10-CM | POA: Diagnosis not present

## 2019-10-16 DIAGNOSIS — H9193 Unspecified hearing loss, bilateral: Secondary | ICD-10-CM | POA: Diagnosis not present

## 2019-10-16 DIAGNOSIS — I4891 Unspecified atrial fibrillation: Secondary | ICD-10-CM | POA: Diagnosis not present

## 2019-10-16 DIAGNOSIS — H539 Unspecified visual disturbance: Secondary | ICD-10-CM | POA: Diagnosis not present

## 2019-10-19 DIAGNOSIS — I872 Venous insufficiency (chronic) (peripheral): Secondary | ICD-10-CM | POA: Diagnosis not present

## 2019-10-19 DIAGNOSIS — I10 Essential (primary) hypertension: Secondary | ICD-10-CM | POA: Diagnosis not present

## 2019-10-19 DIAGNOSIS — M1991 Primary osteoarthritis, unspecified site: Secondary | ICD-10-CM | POA: Diagnosis not present

## 2019-10-19 DIAGNOSIS — H9193 Unspecified hearing loss, bilateral: Secondary | ICD-10-CM | POA: Diagnosis not present

## 2019-10-19 DIAGNOSIS — I2510 Atherosclerotic heart disease of native coronary artery without angina pectoris: Secondary | ICD-10-CM | POA: Diagnosis not present

## 2019-10-19 DIAGNOSIS — R569 Unspecified convulsions: Secondary | ICD-10-CM | POA: Diagnosis not present

## 2019-10-19 DIAGNOSIS — S81801D Unspecified open wound, right lower leg, subsequent encounter: Secondary | ICD-10-CM | POA: Diagnosis not present

## 2019-10-19 DIAGNOSIS — I4891 Unspecified atrial fibrillation: Secondary | ICD-10-CM | POA: Diagnosis not present

## 2019-10-19 DIAGNOSIS — H539 Unspecified visual disturbance: Secondary | ICD-10-CM | POA: Diagnosis not present

## 2019-10-20 DIAGNOSIS — S81801D Unspecified open wound, right lower leg, subsequent encounter: Secondary | ICD-10-CM | POA: Diagnosis not present

## 2019-10-20 DIAGNOSIS — I4891 Unspecified atrial fibrillation: Secondary | ICD-10-CM | POA: Diagnosis not present

## 2019-10-20 DIAGNOSIS — I2510 Atherosclerotic heart disease of native coronary artery without angina pectoris: Secondary | ICD-10-CM | POA: Diagnosis not present

## 2019-10-20 DIAGNOSIS — R569 Unspecified convulsions: Secondary | ICD-10-CM | POA: Diagnosis not present

## 2019-10-20 DIAGNOSIS — H9193 Unspecified hearing loss, bilateral: Secondary | ICD-10-CM | POA: Diagnosis not present

## 2019-10-20 DIAGNOSIS — I872 Venous insufficiency (chronic) (peripheral): Secondary | ICD-10-CM | POA: Diagnosis not present

## 2019-10-20 DIAGNOSIS — M1991 Primary osteoarthritis, unspecified site: Secondary | ICD-10-CM | POA: Diagnosis not present

## 2019-10-20 DIAGNOSIS — H539 Unspecified visual disturbance: Secondary | ICD-10-CM | POA: Diagnosis not present

## 2019-10-20 DIAGNOSIS — I10 Essential (primary) hypertension: Secondary | ICD-10-CM | POA: Diagnosis not present

## 2019-10-22 ENCOUNTER — Telehealth: Payer: Self-pay

## 2019-10-22 ENCOUNTER — Telehealth: Payer: Self-pay | Admitting: Cardiology

## 2019-10-22 NOTE — Telephone Encounter (Signed)
See previous 8/19 telephone note.

## 2019-10-22 NOTE — Telephone Encounter (Signed)
Spoke to patient she stated she will cancel appointment scheduled with [name] 8/25.She will call back if she needs to be seen.

## 2019-10-22 NOTE — Telephone Encounter (Signed)
Yes I would have her take lasix 40 mg daily for 3 days  Ginni Eichler Martinique MD, [facility]

## 2019-10-22 NOTE — Telephone Encounter (Signed)
Spoke to patient she stated she has increased swelling in both feet and ankles,right is worse.Weight stable.Sob alittle worse.Advised I will send message to [place] for advice if ok to increase Lasix for 3 days.

## 2019-10-22 NOTE — Telephone Encounter (Signed)
Pt c/o swelling: STAT is pt has developed SOB within 24 hours  1) How much weight have you gained and in what time span? +/- a pound daily   2) If swelling, where is the swelling located? Ankles and feet   3) Are you currently taking a fluid pill? yes  4) Are you currently SOB? no  5) Do you have a log of your daily weights (if so, list)? 123   6) Have you gained 3 pounds in a day or 5 pounds in a week? no  7) Have you traveled recently? No  Patient says this is the first time she has experienced this much swelling and had the swelling last this long.

## 2019-10-22 NOTE — Telephone Encounter (Signed)
Spoke to patient [name]'s advice given.Add on appointment scheduled with [name] 8/25 at 9:00 am.Stated if she is better she will call back to cancel.

## 2019-10-23 DIAGNOSIS — R569 Unspecified convulsions: Secondary | ICD-10-CM | POA: Diagnosis not present

## 2019-10-23 DIAGNOSIS — M1991 Primary osteoarthritis, unspecified site: Secondary | ICD-10-CM | POA: Diagnosis not present

## 2019-10-23 DIAGNOSIS — H9193 Unspecified hearing loss, bilateral: Secondary | ICD-10-CM | POA: Diagnosis not present

## 2019-10-23 DIAGNOSIS — H539 Unspecified visual disturbance: Secondary | ICD-10-CM | POA: Diagnosis not present

## 2019-10-23 DIAGNOSIS — S81801D Unspecified open wound, right lower leg, subsequent encounter: Secondary | ICD-10-CM | POA: Diagnosis not present

## 2019-10-23 DIAGNOSIS — I2510 Atherosclerotic heart disease of native coronary artery without angina pectoris: Secondary | ICD-10-CM | POA: Diagnosis not present

## 2019-10-23 DIAGNOSIS — I872 Venous insufficiency (chronic) (peripheral): Secondary | ICD-10-CM | POA: Diagnosis not present

## 2019-10-23 DIAGNOSIS — I4891 Unspecified atrial fibrillation: Secondary | ICD-10-CM | POA: Diagnosis not present

## 2019-10-23 DIAGNOSIS — I10 Essential (primary) hypertension: Secondary | ICD-10-CM | POA: Diagnosis not present

## 2019-10-26 DIAGNOSIS — I10 Essential (primary) hypertension: Secondary | ICD-10-CM | POA: Diagnosis not present

## 2019-10-26 DIAGNOSIS — H539 Unspecified visual disturbance: Secondary | ICD-10-CM | POA: Diagnosis not present

## 2019-10-26 DIAGNOSIS — Z20828 Contact with and (suspected) exposure to other viral communicable diseases: Secondary | ICD-10-CM | POA: Diagnosis not present

## 2019-10-26 DIAGNOSIS — I2510 Atherosclerotic heart disease of native coronary artery without angina pectoris: Secondary | ICD-10-CM | POA: Diagnosis not present

## 2019-10-26 DIAGNOSIS — M1991 Primary osteoarthritis, unspecified site: Secondary | ICD-10-CM | POA: Diagnosis not present

## 2019-10-26 DIAGNOSIS — S81801D Unspecified open wound, right lower leg, subsequent encounter: Secondary | ICD-10-CM | POA: Diagnosis not present

## 2019-10-26 DIAGNOSIS — I4891 Unspecified atrial fibrillation: Secondary | ICD-10-CM | POA: Diagnosis not present

## 2019-10-26 DIAGNOSIS — H9193 Unspecified hearing loss, bilateral: Secondary | ICD-10-CM | POA: Diagnosis not present

## 2019-10-26 DIAGNOSIS — I872 Venous insufficiency (chronic) (peripheral): Secondary | ICD-10-CM | POA: Diagnosis not present

## 2019-10-26 DIAGNOSIS — R569 Unspecified convulsions: Secondary | ICD-10-CM | POA: Diagnosis not present

## 2019-10-27 DIAGNOSIS — I4891 Unspecified atrial fibrillation: Secondary | ICD-10-CM | POA: Diagnosis not present

## 2019-10-27 DIAGNOSIS — S81801D Unspecified open wound, right lower leg, subsequent encounter: Secondary | ICD-10-CM | POA: Diagnosis not present

## 2019-10-27 DIAGNOSIS — I2510 Atherosclerotic heart disease of native coronary artery without angina pectoris: Secondary | ICD-10-CM | POA: Diagnosis not present

## 2019-10-27 DIAGNOSIS — I10 Essential (primary) hypertension: Secondary | ICD-10-CM | POA: Diagnosis not present

## 2019-10-27 DIAGNOSIS — H539 Unspecified visual disturbance: Secondary | ICD-10-CM | POA: Diagnosis not present

## 2019-10-27 DIAGNOSIS — R569 Unspecified convulsions: Secondary | ICD-10-CM | POA: Diagnosis not present

## 2019-10-27 DIAGNOSIS — M1991 Primary osteoarthritis, unspecified site: Secondary | ICD-10-CM | POA: Diagnosis not present

## 2019-10-27 DIAGNOSIS — H9193 Unspecified hearing loss, bilateral: Secondary | ICD-10-CM | POA: Diagnosis not present

## 2019-10-27 DIAGNOSIS — I872 Venous insufficiency (chronic) (peripheral): Secondary | ICD-10-CM | POA: Diagnosis not present

## 2019-10-28 ENCOUNTER — Ambulatory Visit: Payer: Medicare PPO | Admitting: Cardiology

## 2019-10-28 DIAGNOSIS — I10 Essential (primary) hypertension: Secondary | ICD-10-CM | POA: Diagnosis not present

## 2019-10-28 DIAGNOSIS — I2510 Atherosclerotic heart disease of native coronary artery without angina pectoris: Secondary | ICD-10-CM | POA: Diagnosis not present

## 2019-10-28 DIAGNOSIS — R569 Unspecified convulsions: Secondary | ICD-10-CM | POA: Diagnosis not present

## 2019-10-28 DIAGNOSIS — H9193 Unspecified hearing loss, bilateral: Secondary | ICD-10-CM | POA: Diagnosis not present

## 2019-10-28 DIAGNOSIS — I872 Venous insufficiency (chronic) (peripheral): Secondary | ICD-10-CM | POA: Diagnosis not present

## 2019-10-28 DIAGNOSIS — M1991 Primary osteoarthritis, unspecified site: Secondary | ICD-10-CM | POA: Diagnosis not present

## 2019-10-28 DIAGNOSIS — I4891 Unspecified atrial fibrillation: Secondary | ICD-10-CM | POA: Diagnosis not present

## 2019-10-28 DIAGNOSIS — H539 Unspecified visual disturbance: Secondary | ICD-10-CM | POA: Diagnosis not present

## 2019-10-28 DIAGNOSIS — S81801D Unspecified open wound, right lower leg, subsequent encounter: Secondary | ICD-10-CM | POA: Diagnosis not present

## 2019-10-29 DIAGNOSIS — I4891 Unspecified atrial fibrillation: Secondary | ICD-10-CM | POA: Diagnosis not present

## 2019-10-29 DIAGNOSIS — I10 Essential (primary) hypertension: Secondary | ICD-10-CM | POA: Diagnosis not present

## 2019-10-29 DIAGNOSIS — I872 Venous insufficiency (chronic) (peripheral): Secondary | ICD-10-CM | POA: Diagnosis not present

## 2019-10-29 DIAGNOSIS — H539 Unspecified visual disturbance: Secondary | ICD-10-CM | POA: Diagnosis not present

## 2019-10-29 DIAGNOSIS — I2510 Atherosclerotic heart disease of native coronary artery without angina pectoris: Secondary | ICD-10-CM | POA: Diagnosis not present

## 2019-10-29 DIAGNOSIS — H9193 Unspecified hearing loss, bilateral: Secondary | ICD-10-CM | POA: Diagnosis not present

## 2019-10-29 DIAGNOSIS — M1991 Primary osteoarthritis, unspecified site: Secondary | ICD-10-CM | POA: Diagnosis not present

## 2019-10-29 DIAGNOSIS — S81801D Unspecified open wound, right lower leg, subsequent encounter: Secondary | ICD-10-CM | POA: Diagnosis not present

## 2019-10-29 DIAGNOSIS — R569 Unspecified convulsions: Secondary | ICD-10-CM | POA: Diagnosis not present

## 2019-11-02 DIAGNOSIS — S81801D Unspecified open wound, right lower leg, subsequent encounter: Secondary | ICD-10-CM | POA: Diagnosis not present

## 2019-11-02 DIAGNOSIS — H539 Unspecified visual disturbance: Secondary | ICD-10-CM | POA: Diagnosis not present

## 2019-11-02 DIAGNOSIS — I2510 Atherosclerotic heart disease of native coronary artery without angina pectoris: Secondary | ICD-10-CM | POA: Diagnosis not present

## 2019-11-02 DIAGNOSIS — I872 Venous insufficiency (chronic) (peripheral): Secondary | ICD-10-CM | POA: Diagnosis not present

## 2019-11-02 DIAGNOSIS — I10 Essential (primary) hypertension: Secondary | ICD-10-CM | POA: Diagnosis not present

## 2019-11-02 DIAGNOSIS — H9193 Unspecified hearing loss, bilateral: Secondary | ICD-10-CM | POA: Diagnosis not present

## 2019-11-02 DIAGNOSIS — M1991 Primary osteoarthritis, unspecified site: Secondary | ICD-10-CM | POA: Diagnosis not present

## 2019-11-02 DIAGNOSIS — I4891 Unspecified atrial fibrillation: Secondary | ICD-10-CM | POA: Diagnosis not present

## 2019-11-02 DIAGNOSIS — R569 Unspecified convulsions: Secondary | ICD-10-CM | POA: Diagnosis not present

## 2019-11-03 DIAGNOSIS — R569 Unspecified convulsions: Secondary | ICD-10-CM | POA: Diagnosis not present

## 2019-11-03 DIAGNOSIS — I872 Venous insufficiency (chronic) (peripheral): Secondary | ICD-10-CM | POA: Diagnosis not present

## 2019-11-03 DIAGNOSIS — I4891 Unspecified atrial fibrillation: Secondary | ICD-10-CM | POA: Diagnosis not present

## 2019-11-03 DIAGNOSIS — H539 Unspecified visual disturbance: Secondary | ICD-10-CM | POA: Diagnosis not present

## 2019-11-03 DIAGNOSIS — S81801D Unspecified open wound, right lower leg, subsequent encounter: Secondary | ICD-10-CM | POA: Diagnosis not present

## 2019-11-03 DIAGNOSIS — I10 Essential (primary) hypertension: Secondary | ICD-10-CM | POA: Diagnosis not present

## 2019-11-03 DIAGNOSIS — I2510 Atherosclerotic heart disease of native coronary artery without angina pectoris: Secondary | ICD-10-CM | POA: Diagnosis not present

## 2019-11-03 DIAGNOSIS — M1991 Primary osteoarthritis, unspecified site: Secondary | ICD-10-CM | POA: Diagnosis not present

## 2019-11-03 DIAGNOSIS — H9193 Unspecified hearing loss, bilateral: Secondary | ICD-10-CM | POA: Diagnosis not present

## 2019-11-05 DIAGNOSIS — M1991 Primary osteoarthritis, unspecified site: Secondary | ICD-10-CM | POA: Diagnosis not present

## 2019-11-05 DIAGNOSIS — I872 Venous insufficiency (chronic) (peripheral): Secondary | ICD-10-CM | POA: Diagnosis not present

## 2019-11-05 DIAGNOSIS — H539 Unspecified visual disturbance: Secondary | ICD-10-CM | POA: Diagnosis not present

## 2019-11-05 DIAGNOSIS — I10 Essential (primary) hypertension: Secondary | ICD-10-CM | POA: Diagnosis not present

## 2019-11-05 DIAGNOSIS — H9193 Unspecified hearing loss, bilateral: Secondary | ICD-10-CM | POA: Diagnosis not present

## 2019-11-05 DIAGNOSIS — I2510 Atherosclerotic heart disease of native coronary artery without angina pectoris: Secondary | ICD-10-CM | POA: Diagnosis not present

## 2019-11-05 DIAGNOSIS — I4891 Unspecified atrial fibrillation: Secondary | ICD-10-CM | POA: Diagnosis not present

## 2019-11-05 DIAGNOSIS — R569 Unspecified convulsions: Secondary | ICD-10-CM | POA: Diagnosis not present

## 2019-11-05 DIAGNOSIS — S81801D Unspecified open wound, right lower leg, subsequent encounter: Secondary | ICD-10-CM | POA: Diagnosis not present

## 2019-11-08 DIAGNOSIS — S81801D Unspecified open wound, right lower leg, subsequent encounter: Secondary | ICD-10-CM | POA: Diagnosis not present

## 2019-11-08 DIAGNOSIS — M1991 Primary osteoarthritis, unspecified site: Secondary | ICD-10-CM | POA: Diagnosis not present

## 2019-11-08 DIAGNOSIS — I10 Essential (primary) hypertension: Secondary | ICD-10-CM | POA: Diagnosis not present

## 2019-11-08 DIAGNOSIS — I2510 Atherosclerotic heart disease of native coronary artery without angina pectoris: Secondary | ICD-10-CM | POA: Diagnosis not present

## 2019-11-08 DIAGNOSIS — I4891 Unspecified atrial fibrillation: Secondary | ICD-10-CM | POA: Diagnosis not present

## 2019-11-08 DIAGNOSIS — H9193 Unspecified hearing loss, bilateral: Secondary | ICD-10-CM | POA: Diagnosis not present

## 2019-11-08 DIAGNOSIS — I872 Venous insufficiency (chronic) (peripheral): Secondary | ICD-10-CM | POA: Diagnosis not present

## 2019-11-08 DIAGNOSIS — H539 Unspecified visual disturbance: Secondary | ICD-10-CM | POA: Diagnosis not present

## 2019-11-08 DIAGNOSIS — R569 Unspecified convulsions: Secondary | ICD-10-CM | POA: Diagnosis not present

## 2019-11-10 DIAGNOSIS — I10 Essential (primary) hypertension: Secondary | ICD-10-CM | POA: Diagnosis not present

## 2019-11-10 DIAGNOSIS — R569 Unspecified convulsions: Secondary | ICD-10-CM | POA: Diagnosis not present

## 2019-11-10 DIAGNOSIS — H9193 Unspecified hearing loss, bilateral: Secondary | ICD-10-CM | POA: Diagnosis not present

## 2019-11-10 DIAGNOSIS — H539 Unspecified visual disturbance: Secondary | ICD-10-CM | POA: Diagnosis not present

## 2019-11-10 DIAGNOSIS — I4891 Unspecified atrial fibrillation: Secondary | ICD-10-CM | POA: Diagnosis not present

## 2019-11-10 DIAGNOSIS — S81801D Unspecified open wound, right lower leg, subsequent encounter: Secondary | ICD-10-CM | POA: Diagnosis not present

## 2019-11-10 DIAGNOSIS — M1991 Primary osteoarthritis, unspecified site: Secondary | ICD-10-CM | POA: Diagnosis not present

## 2019-11-10 DIAGNOSIS — I872 Venous insufficiency (chronic) (peripheral): Secondary | ICD-10-CM | POA: Diagnosis not present

## 2019-11-10 DIAGNOSIS — I2510 Atherosclerotic heart disease of native coronary artery without angina pectoris: Secondary | ICD-10-CM | POA: Diagnosis not present

## 2019-11-11 DIAGNOSIS — D649 Anemia, unspecified: Secondary | ICD-10-CM | POA: Diagnosis not present

## 2019-11-11 DIAGNOSIS — I10 Essential (primary) hypertension: Secondary | ICD-10-CM | POA: Diagnosis not present

## 2019-11-11 DIAGNOSIS — M545 Low back pain: Secondary | ICD-10-CM | POA: Diagnosis not present

## 2019-11-11 DIAGNOSIS — M25561 Pain in right knee: Secondary | ICD-10-CM | POA: Diagnosis not present

## 2019-11-12 DIAGNOSIS — I10 Essential (primary) hypertension: Secondary | ICD-10-CM | POA: Diagnosis not present

## 2019-11-12 DIAGNOSIS — R569 Unspecified convulsions: Secondary | ICD-10-CM | POA: Diagnosis not present

## 2019-11-12 DIAGNOSIS — I4891 Unspecified atrial fibrillation: Secondary | ICD-10-CM | POA: Diagnosis not present

## 2019-11-12 DIAGNOSIS — H539 Unspecified visual disturbance: Secondary | ICD-10-CM | POA: Diagnosis not present

## 2019-11-12 DIAGNOSIS — M1991 Primary osteoarthritis, unspecified site: Secondary | ICD-10-CM | POA: Diagnosis not present

## 2019-11-12 DIAGNOSIS — I2510 Atherosclerotic heart disease of native coronary artery without angina pectoris: Secondary | ICD-10-CM | POA: Diagnosis not present

## 2019-11-12 DIAGNOSIS — H9193 Unspecified hearing loss, bilateral: Secondary | ICD-10-CM | POA: Diagnosis not present

## 2019-11-12 DIAGNOSIS — S81801D Unspecified open wound, right lower leg, subsequent encounter: Secondary | ICD-10-CM | POA: Diagnosis not present

## 2019-11-12 DIAGNOSIS — I872 Venous insufficiency (chronic) (peripheral): Secondary | ICD-10-CM | POA: Diagnosis not present

## 2019-11-15 DIAGNOSIS — I2510 Atherosclerotic heart disease of native coronary artery without angina pectoris: Secondary | ICD-10-CM | POA: Diagnosis not present

## 2019-11-15 DIAGNOSIS — R569 Unspecified convulsions: Secondary | ICD-10-CM | POA: Diagnosis not present

## 2019-11-15 DIAGNOSIS — I872 Venous insufficiency (chronic) (peripheral): Secondary | ICD-10-CM | POA: Diagnosis not present

## 2019-11-15 DIAGNOSIS — I10 Essential (primary) hypertension: Secondary | ICD-10-CM | POA: Diagnosis not present

## 2019-11-15 DIAGNOSIS — H9193 Unspecified hearing loss, bilateral: Secondary | ICD-10-CM | POA: Diagnosis not present

## 2019-11-15 DIAGNOSIS — M1991 Primary osteoarthritis, unspecified site: Secondary | ICD-10-CM | POA: Diagnosis not present

## 2019-11-15 DIAGNOSIS — S81801D Unspecified open wound, right lower leg, subsequent encounter: Secondary | ICD-10-CM | POA: Diagnosis not present

## 2019-11-15 DIAGNOSIS — H539 Unspecified visual disturbance: Secondary | ICD-10-CM | POA: Diagnosis not present

## 2019-11-15 DIAGNOSIS — I4891 Unspecified atrial fibrillation: Secondary | ICD-10-CM | POA: Diagnosis not present

## 2019-11-16 DIAGNOSIS — H539 Unspecified visual disturbance: Secondary | ICD-10-CM | POA: Diagnosis not present

## 2019-11-16 DIAGNOSIS — R569 Unspecified convulsions: Secondary | ICD-10-CM | POA: Diagnosis not present

## 2019-11-16 DIAGNOSIS — I2510 Atherosclerotic heart disease of native coronary artery without angina pectoris: Secondary | ICD-10-CM | POA: Diagnosis not present

## 2019-11-16 DIAGNOSIS — M1991 Primary osteoarthritis, unspecified site: Secondary | ICD-10-CM | POA: Diagnosis not present

## 2019-11-16 DIAGNOSIS — I10 Essential (primary) hypertension: Secondary | ICD-10-CM | POA: Diagnosis not present

## 2019-11-16 DIAGNOSIS — H9193 Unspecified hearing loss, bilateral: Secondary | ICD-10-CM | POA: Diagnosis not present

## 2019-11-16 DIAGNOSIS — I872 Venous insufficiency (chronic) (peripheral): Secondary | ICD-10-CM | POA: Diagnosis not present

## 2019-11-16 DIAGNOSIS — I4891 Unspecified atrial fibrillation: Secondary | ICD-10-CM | POA: Diagnosis not present

## 2019-11-16 DIAGNOSIS — S81801D Unspecified open wound, right lower leg, subsequent encounter: Secondary | ICD-10-CM | POA: Diagnosis not present

## 2019-11-17 DIAGNOSIS — I10 Essential (primary) hypertension: Secondary | ICD-10-CM | POA: Diagnosis not present

## 2019-11-17 DIAGNOSIS — I2510 Atherosclerotic heart disease of native coronary artery without angina pectoris: Secondary | ICD-10-CM | POA: Diagnosis not present

## 2019-11-17 DIAGNOSIS — S81801D Unspecified open wound, right lower leg, subsequent encounter: Secondary | ICD-10-CM | POA: Diagnosis not present

## 2019-11-17 DIAGNOSIS — I4891 Unspecified atrial fibrillation: Secondary | ICD-10-CM | POA: Diagnosis not present

## 2019-11-17 DIAGNOSIS — H539 Unspecified visual disturbance: Secondary | ICD-10-CM | POA: Diagnosis not present

## 2019-11-17 DIAGNOSIS — I872 Venous insufficiency (chronic) (peripheral): Secondary | ICD-10-CM | POA: Diagnosis not present

## 2019-11-17 DIAGNOSIS — R569 Unspecified convulsions: Secondary | ICD-10-CM | POA: Diagnosis not present

## 2019-11-17 DIAGNOSIS — H9193 Unspecified hearing loss, bilateral: Secondary | ICD-10-CM | POA: Diagnosis not present

## 2019-11-17 DIAGNOSIS — M1991 Primary osteoarthritis, unspecified site: Secondary | ICD-10-CM | POA: Diagnosis not present

## 2019-11-19 DIAGNOSIS — I10 Essential (primary) hypertension: Secondary | ICD-10-CM | POA: Diagnosis not present

## 2019-11-19 DIAGNOSIS — I872 Venous insufficiency (chronic) (peripheral): Secondary | ICD-10-CM | POA: Diagnosis not present

## 2019-11-19 DIAGNOSIS — M1991 Primary osteoarthritis, unspecified site: Secondary | ICD-10-CM | POA: Diagnosis not present

## 2019-11-19 DIAGNOSIS — H539 Unspecified visual disturbance: Secondary | ICD-10-CM | POA: Diagnosis not present

## 2019-11-19 DIAGNOSIS — S81801D Unspecified open wound, right lower leg, subsequent encounter: Secondary | ICD-10-CM | POA: Diagnosis not present

## 2019-11-19 DIAGNOSIS — H9193 Unspecified hearing loss, bilateral: Secondary | ICD-10-CM | POA: Diagnosis not present

## 2019-11-19 DIAGNOSIS — I2510 Atherosclerotic heart disease of native coronary artery without angina pectoris: Secondary | ICD-10-CM | POA: Diagnosis not present

## 2019-11-19 DIAGNOSIS — R569 Unspecified convulsions: Secondary | ICD-10-CM | POA: Diagnosis not present

## 2019-11-19 DIAGNOSIS — I4891 Unspecified atrial fibrillation: Secondary | ICD-10-CM | POA: Diagnosis not present

## 2019-11-23 DIAGNOSIS — H539 Unspecified visual disturbance: Secondary | ICD-10-CM | POA: Diagnosis not present

## 2019-11-23 DIAGNOSIS — I872 Venous insufficiency (chronic) (peripheral): Secondary | ICD-10-CM | POA: Diagnosis not present

## 2019-11-23 DIAGNOSIS — I4891 Unspecified atrial fibrillation: Secondary | ICD-10-CM | POA: Diagnosis not present

## 2019-11-23 DIAGNOSIS — R569 Unspecified convulsions: Secondary | ICD-10-CM | POA: Diagnosis not present

## 2019-11-23 DIAGNOSIS — I2510 Atherosclerotic heart disease of native coronary artery without angina pectoris: Secondary | ICD-10-CM | POA: Diagnosis not present

## 2019-11-23 DIAGNOSIS — M1991 Primary osteoarthritis, unspecified site: Secondary | ICD-10-CM | POA: Diagnosis not present

## 2019-11-23 DIAGNOSIS — S81801D Unspecified open wound, right lower leg, subsequent encounter: Secondary | ICD-10-CM | POA: Diagnosis not present

## 2019-11-23 DIAGNOSIS — H04123 Dry eye syndrome of bilateral lacrimal glands: Secondary | ICD-10-CM | POA: Diagnosis not present

## 2019-11-23 DIAGNOSIS — H9193 Unspecified hearing loss, bilateral: Secondary | ICD-10-CM | POA: Diagnosis not present

## 2019-11-23 DIAGNOSIS — I10 Essential (primary) hypertension: Secondary | ICD-10-CM | POA: Diagnosis not present

## 2019-11-23 DIAGNOSIS — H43813 Vitreous degeneration, bilateral: Secondary | ICD-10-CM | POA: Diagnosis not present

## 2019-11-23 DIAGNOSIS — Z961 Presence of intraocular lens: Secondary | ICD-10-CM | POA: Diagnosis not present

## 2019-11-23 DIAGNOSIS — H353132 Nonexudative age-related macular degeneration, bilateral, intermediate dry stage: Secondary | ICD-10-CM | POA: Diagnosis not present

## 2019-11-26 DIAGNOSIS — I4891 Unspecified atrial fibrillation: Secondary | ICD-10-CM | POA: Diagnosis not present

## 2019-11-26 DIAGNOSIS — H9193 Unspecified hearing loss, bilateral: Secondary | ICD-10-CM | POA: Diagnosis not present

## 2019-11-26 DIAGNOSIS — I10 Essential (primary) hypertension: Secondary | ICD-10-CM | POA: Diagnosis not present

## 2019-11-26 DIAGNOSIS — S81801D Unspecified open wound, right lower leg, subsequent encounter: Secondary | ICD-10-CM | POA: Diagnosis not present

## 2019-11-26 DIAGNOSIS — H539 Unspecified visual disturbance: Secondary | ICD-10-CM | POA: Diagnosis not present

## 2019-11-26 DIAGNOSIS — I872 Venous insufficiency (chronic) (peripheral): Secondary | ICD-10-CM | POA: Diagnosis not present

## 2019-11-26 DIAGNOSIS — M1991 Primary osteoarthritis, unspecified site: Secondary | ICD-10-CM | POA: Diagnosis not present

## 2019-11-26 DIAGNOSIS — I2510 Atherosclerotic heart disease of native coronary artery without angina pectoris: Secondary | ICD-10-CM | POA: Diagnosis not present

## 2019-11-26 DIAGNOSIS — R569 Unspecified convulsions: Secondary | ICD-10-CM | POA: Diagnosis not present

## 2019-11-27 DIAGNOSIS — S81801D Unspecified open wound, right lower leg, subsequent encounter: Secondary | ICD-10-CM | POA: Diagnosis not present

## 2019-11-27 DIAGNOSIS — H539 Unspecified visual disturbance: Secondary | ICD-10-CM | POA: Diagnosis not present

## 2019-11-27 DIAGNOSIS — R569 Unspecified convulsions: Secondary | ICD-10-CM | POA: Diagnosis not present

## 2019-11-27 DIAGNOSIS — M1991 Primary osteoarthritis, unspecified site: Secondary | ICD-10-CM | POA: Diagnosis not present

## 2019-11-27 DIAGNOSIS — I4891 Unspecified atrial fibrillation: Secondary | ICD-10-CM | POA: Diagnosis not present

## 2019-11-27 DIAGNOSIS — I872 Venous insufficiency (chronic) (peripheral): Secondary | ICD-10-CM | POA: Diagnosis not present

## 2019-11-27 DIAGNOSIS — H9193 Unspecified hearing loss, bilateral: Secondary | ICD-10-CM | POA: Diagnosis not present

## 2019-11-27 DIAGNOSIS — I10 Essential (primary) hypertension: Secondary | ICD-10-CM | POA: Diagnosis not present

## 2019-11-27 DIAGNOSIS — I2510 Atherosclerotic heart disease of native coronary artery without angina pectoris: Secondary | ICD-10-CM | POA: Diagnosis not present

## 2019-11-30 DIAGNOSIS — I4891 Unspecified atrial fibrillation: Secondary | ICD-10-CM | POA: Diagnosis not present

## 2019-11-30 DIAGNOSIS — I10 Essential (primary) hypertension: Secondary | ICD-10-CM | POA: Diagnosis not present

## 2019-11-30 DIAGNOSIS — H9193 Unspecified hearing loss, bilateral: Secondary | ICD-10-CM | POA: Diagnosis not present

## 2019-11-30 DIAGNOSIS — I2510 Atherosclerotic heart disease of native coronary artery without angina pectoris: Secondary | ICD-10-CM | POA: Diagnosis not present

## 2019-11-30 DIAGNOSIS — S81801D Unspecified open wound, right lower leg, subsequent encounter: Secondary | ICD-10-CM | POA: Diagnosis not present

## 2019-11-30 DIAGNOSIS — H539 Unspecified visual disturbance: Secondary | ICD-10-CM | POA: Diagnosis not present

## 2019-11-30 DIAGNOSIS — M1991 Primary osteoarthritis, unspecified site: Secondary | ICD-10-CM | POA: Diagnosis not present

## 2019-11-30 DIAGNOSIS — R569 Unspecified convulsions: Secondary | ICD-10-CM | POA: Diagnosis not present

## 2019-11-30 DIAGNOSIS — I872 Venous insufficiency (chronic) (peripheral): Secondary | ICD-10-CM | POA: Diagnosis not present

## 2019-12-01 DIAGNOSIS — H539 Unspecified visual disturbance: Secondary | ICD-10-CM | POA: Diagnosis not present

## 2019-12-01 DIAGNOSIS — S81801D Unspecified open wound, right lower leg, subsequent encounter: Secondary | ICD-10-CM | POA: Diagnosis not present

## 2019-12-01 DIAGNOSIS — I2510 Atherosclerotic heart disease of native coronary artery without angina pectoris: Secondary | ICD-10-CM | POA: Diagnosis not present

## 2019-12-01 DIAGNOSIS — M1991 Primary osteoarthritis, unspecified site: Secondary | ICD-10-CM | POA: Diagnosis not present

## 2019-12-01 DIAGNOSIS — I872 Venous insufficiency (chronic) (peripheral): Secondary | ICD-10-CM | POA: Diagnosis not present

## 2019-12-01 DIAGNOSIS — I4891 Unspecified atrial fibrillation: Secondary | ICD-10-CM | POA: Diagnosis not present

## 2019-12-01 DIAGNOSIS — R569 Unspecified convulsions: Secondary | ICD-10-CM | POA: Diagnosis not present

## 2019-12-01 DIAGNOSIS — I10 Essential (primary) hypertension: Secondary | ICD-10-CM | POA: Diagnosis not present

## 2019-12-01 DIAGNOSIS — H9193 Unspecified hearing loss, bilateral: Secondary | ICD-10-CM | POA: Diagnosis not present

## 2019-12-03 DIAGNOSIS — H539 Unspecified visual disturbance: Secondary | ICD-10-CM | POA: Diagnosis not present

## 2019-12-03 DIAGNOSIS — I10 Essential (primary) hypertension: Secondary | ICD-10-CM | POA: Diagnosis not present

## 2019-12-03 DIAGNOSIS — I4891 Unspecified atrial fibrillation: Secondary | ICD-10-CM | POA: Diagnosis not present

## 2019-12-03 DIAGNOSIS — R569 Unspecified convulsions: Secondary | ICD-10-CM | POA: Diagnosis not present

## 2019-12-03 DIAGNOSIS — I872 Venous insufficiency (chronic) (peripheral): Secondary | ICD-10-CM | POA: Diagnosis not present

## 2019-12-03 DIAGNOSIS — I2510 Atherosclerotic heart disease of native coronary artery without angina pectoris: Secondary | ICD-10-CM | POA: Diagnosis not present

## 2019-12-03 DIAGNOSIS — S81801D Unspecified open wound, right lower leg, subsequent encounter: Secondary | ICD-10-CM | POA: Diagnosis not present

## 2019-12-03 DIAGNOSIS — M1991 Primary osteoarthritis, unspecified site: Secondary | ICD-10-CM | POA: Diagnosis not present

## 2019-12-03 DIAGNOSIS — H9193 Unspecified hearing loss, bilateral: Secondary | ICD-10-CM | POA: Diagnosis not present

## 2019-12-07 ENCOUNTER — Ambulatory Visit (INDEPENDENT_AMBULATORY_CARE_PROVIDER_SITE_OTHER): Payer: Medicare PPO

## 2019-12-07 DIAGNOSIS — S81801D Unspecified open wound, right lower leg, subsequent encounter: Secondary | ICD-10-CM | POA: Diagnosis not present

## 2019-12-07 DIAGNOSIS — I495 Sick sinus syndrome: Secondary | ICD-10-CM | POA: Diagnosis not present

## 2019-12-07 DIAGNOSIS — R569 Unspecified convulsions: Secondary | ICD-10-CM | POA: Diagnosis not present

## 2019-12-07 DIAGNOSIS — I4891 Unspecified atrial fibrillation: Secondary | ICD-10-CM | POA: Diagnosis not present

## 2019-12-07 DIAGNOSIS — I872 Venous insufficiency (chronic) (peripheral): Secondary | ICD-10-CM | POA: Diagnosis not present

## 2019-12-07 DIAGNOSIS — M1991 Primary osteoarthritis, unspecified site: Secondary | ICD-10-CM | POA: Diagnosis not present

## 2019-12-07 DIAGNOSIS — I10 Essential (primary) hypertension: Secondary | ICD-10-CM | POA: Diagnosis not present

## 2019-12-07 DIAGNOSIS — I2510 Atherosclerotic heart disease of native coronary artery without angina pectoris: Secondary | ICD-10-CM | POA: Diagnosis not present

## 2019-12-07 DIAGNOSIS — H9193 Unspecified hearing loss, bilateral: Secondary | ICD-10-CM | POA: Diagnosis not present

## 2019-12-07 DIAGNOSIS — H539 Unspecified visual disturbance: Secondary | ICD-10-CM | POA: Diagnosis not present

## 2019-12-08 DIAGNOSIS — I10 Essential (primary) hypertension: Secondary | ICD-10-CM | POA: Diagnosis not present

## 2019-12-08 DIAGNOSIS — E039 Hypothyroidism, unspecified: Secondary | ICD-10-CM | POA: Diagnosis not present

## 2019-12-08 DIAGNOSIS — M810 Age-related osteoporosis without current pathological fracture: Secondary | ICD-10-CM | POA: Diagnosis not present

## 2019-12-08 DIAGNOSIS — E785 Hyperlipidemia, unspecified: Secondary | ICD-10-CM | POA: Diagnosis not present

## 2019-12-08 LAB — CUP PACEART REMOTE DEVICE CHECK
Battery Impedance: 185 Ohm
Battery Remaining Longevity: 114 mo
Battery Voltage: 2.78 V
Brady Statistic AP VP Percent: 0 %
Brady Statistic AP VS Percent: 99 %
Brady Statistic AS VP Percent: 0 %
Brady Statistic AS VS Percent: 1 %
Date Time Interrogation Session: 20211004124635
Implantable Lead Implant Date: 20180807
Implantable Lead Implant Date: 20180807
Implantable Lead Location: 753859
Implantable Lead Location: 753860
Implantable Lead Model: 5076
Implantable Lead Model: 5076
Implantable Pulse Generator Implant Date: 20180807
Lead Channel Impedance Value: 303 Ohm
Lead Channel Impedance Value: 505 Ohm
Lead Channel Pacing Threshold Amplitude: 0.75 V
Lead Channel Pacing Threshold Amplitude: 0.75 V
Lead Channel Pacing Threshold Pulse Width: 0.4 ms
Lead Channel Pacing Threshold Pulse Width: 0.4 ms
Lead Channel Setting Pacing Amplitude: 2 V
Lead Channel Setting Pacing Amplitude: 2.5 V
Lead Channel Setting Pacing Pulse Width: 0.4 ms
Lead Channel Setting Sensing Sensitivity: 5.6 mV

## 2019-12-09 NOTE — Progress Notes (Signed)
Remote pacemaker transmission.   

## 2019-12-10 DIAGNOSIS — I10 Essential (primary) hypertension: Secondary | ICD-10-CM | POA: Diagnosis not present

## 2019-12-10 DIAGNOSIS — H9193 Unspecified hearing loss, bilateral: Secondary | ICD-10-CM | POA: Diagnosis not present

## 2019-12-10 DIAGNOSIS — M1991 Primary osteoarthritis, unspecified site: Secondary | ICD-10-CM | POA: Diagnosis not present

## 2019-12-10 DIAGNOSIS — I872 Venous insufficiency (chronic) (peripheral): Secondary | ICD-10-CM | POA: Diagnosis not present

## 2019-12-10 DIAGNOSIS — I4891 Unspecified atrial fibrillation: Secondary | ICD-10-CM | POA: Diagnosis not present

## 2019-12-10 DIAGNOSIS — R569 Unspecified convulsions: Secondary | ICD-10-CM | POA: Diagnosis not present

## 2019-12-10 DIAGNOSIS — I2510 Atherosclerotic heart disease of native coronary artery without angina pectoris: Secondary | ICD-10-CM | POA: Diagnosis not present

## 2019-12-10 DIAGNOSIS — S81801D Unspecified open wound, right lower leg, subsequent encounter: Secondary | ICD-10-CM | POA: Diagnosis not present

## 2019-12-10 DIAGNOSIS — H539 Unspecified visual disturbance: Secondary | ICD-10-CM | POA: Diagnosis not present

## 2019-12-12 ENCOUNTER — Other Ambulatory Visit: Payer: Self-pay | Admitting: Physician Assistant

## 2019-12-14 ENCOUNTER — Encounter: Payer: Self-pay | Admitting: Student

## 2019-12-14 ENCOUNTER — Other Ambulatory Visit: Payer: Self-pay

## 2019-12-14 ENCOUNTER — Ambulatory Visit: Payer: Medicare PPO | Admitting: Student

## 2019-12-14 VITALS — BP 130/52 | HR 68 | Ht 65.0 in | Wt 129.6 lb

## 2019-12-14 DIAGNOSIS — I350 Nonrheumatic aortic (valve) stenosis: Secondary | ICD-10-CM | POA: Diagnosis not present

## 2019-12-14 DIAGNOSIS — I480 Paroxysmal atrial fibrillation: Secondary | ICD-10-CM

## 2019-12-14 DIAGNOSIS — I10 Essential (primary) hypertension: Secondary | ICD-10-CM

## 2019-12-14 DIAGNOSIS — I495 Sick sinus syndrome: Secondary | ICD-10-CM

## 2019-12-14 DIAGNOSIS — Z79899 Other long term (current) drug therapy: Secondary | ICD-10-CM | POA: Diagnosis not present

## 2019-12-14 DIAGNOSIS — Z950 Presence of cardiac pacemaker: Secondary | ICD-10-CM | POA: Diagnosis not present

## 2019-12-14 LAB — CUP PACEART INCLINIC DEVICE CHECK
Battery Impedance: 185 Ohm
Battery Remaining Longevity: 113 mo
Battery Voltage: 2.78 V
Brady Statistic AP VP Percent: 0 %
Brady Statistic AP VS Percent: 99 %
Brady Statistic AS VP Percent: 0 %
Brady Statistic AS VS Percent: 1 %
Date Time Interrogation Session: 20211011102459
Implantable Lead Implant Date: 20180807
Implantable Lead Implant Date: 20180807
Implantable Lead Location: 753859
Implantable Lead Location: 753860
Implantable Lead Model: 5076
Implantable Lead Model: 5076
Implantable Pulse Generator Implant Date: 20180807
Lead Channel Impedance Value: 297 Ohm
Lead Channel Impedance Value: 503 Ohm
Lead Channel Pacing Threshold Amplitude: 0.625 V
Lead Channel Pacing Threshold Amplitude: 0.75 V
Lead Channel Pacing Threshold Amplitude: 0.75 V
Lead Channel Pacing Threshold Amplitude: 0.75 V
Lead Channel Pacing Threshold Amplitude: 1.5 V
Lead Channel Pacing Threshold Pulse Width: 0.34 ms
Lead Channel Pacing Threshold Pulse Width: 0.4 ms
Lead Channel Pacing Threshold Pulse Width: 0.4 ms
Lead Channel Pacing Threshold Pulse Width: 0.4 ms
Lead Channel Pacing Threshold Pulse Width: 0.4 ms
Lead Channel Sensing Intrinsic Amplitude: 0.5 mV
Lead Channel Sensing Intrinsic Amplitude: 15.67 mV
Lead Channel Setting Pacing Amplitude: 2 V
Lead Channel Setting Pacing Amplitude: 2.5 V
Lead Channel Setting Pacing Pulse Width: 0.4 ms
Lead Channel Setting Sensing Sensitivity: 5.6 mV

## 2019-12-14 LAB — COMPREHENSIVE METABOLIC PANEL
ALT: 13 IU/L (ref 0–32)
AST: 25 IU/L (ref 0–40)
Albumin/Globulin Ratio: 1.8 (ref 1.2–2.2)
Albumin: 4.2 g/dL (ref 3.6–4.6)
Alkaline Phosphatase: 83 IU/L (ref 44–121)
BUN/Creatinine Ratio: 25 (ref 12–28)
BUN: 33 mg/dL — ABNORMAL HIGH (ref 8–27)
Bilirubin Total: 0.7 mg/dL (ref 0.0–1.2)
CO2: 28 mmol/L (ref 20–29)
Calcium: 9.8 mg/dL (ref 8.7–10.3)
Chloride: 102 mmol/L (ref 96–106)
Creatinine, Ser: 1.33 mg/dL — ABNORMAL HIGH (ref 0.57–1.00)
GFR calc Af Amer: 41 mL/min/{1.73_m2} — ABNORMAL LOW (ref >59)
GFR calc non Af Amer: 36 mL/min/{1.73_m2} — ABNORMAL LOW (ref >59)
Globulin, Total: 2.4 g/dL (ref 1.5–4.5)
Glucose: 79 mg/dL (ref 65–99)
Potassium: 4.4 mmol/L (ref 3.5–5.2)
Sodium: 142 mmol/L (ref 134–144)
Total Protein: 6.6 g/dL (ref 6.0–8.5)

## 2019-12-14 LAB — CBC WITH DIFFERENTIAL/PLATELET
Basophils Absolute: 0.1 10*3/uL (ref 0.0–0.2)
Basos: 1 %
EOS (ABSOLUTE): 0.1 10*3/uL (ref 0.0–0.4)
Eos: 2 %
Hematocrit: 39.6 % (ref 34.0–46.6)
Hemoglobin: 13 g/dL (ref 11.1–15.9)
Immature Grans (Abs): 0 10*3/uL (ref 0.0–0.1)
Immature Granulocytes: 0 %
Lymphocytes Absolute: 0.9 10*3/uL (ref 0.7–3.1)
Lymphs: 14 %
MCH: 32.9 pg (ref 26.6–33.0)
MCHC: 32.8 g/dL (ref 31.5–35.7)
MCV: 100 fL — ABNORMAL HIGH (ref 79–97)
Monocytes Absolute: 0.5 10*3/uL (ref 0.1–0.9)
Monocytes: 8 %
Neutrophils Absolute: 5 10*3/uL (ref 1.4–7.0)
Neutrophils: 75 %
Platelets: 153 10*3/uL (ref 150–450)
RBC: 3.95 x10E6/uL (ref 3.77–5.28)
RDW: 12.6 % (ref 11.7–15.4)
WBC: 6.6 10*3/uL (ref 3.4–10.8)

## 2019-12-14 LAB — TSH: TSH: 3.07 u[IU]/mL (ref 0.450–4.500)

## 2019-12-14 LAB — MAGNESIUM: Magnesium: 1.9 mg/dL (ref 1.6–2.3)

## 2019-12-14 NOTE — Patient Instructions (Addendum)
Medication Instructions:  *If you need a refill on your cardiac medications before your next appointment, please call your pharmacy*  Lab Work: Your physician has recommended that you have lab work today: CMET, CBC, TSH, and Magnesium Level  If you have labs (blood work) drawn today and your tests are completely normal, you will receive your results only by: Marland Kitchen MyChart Message (if you have MyChart) OR . A paper copy in the mail If you have any lab test that is abnormal or we need to change your treatment, we will call you to review the results.  Follow-Up: At [facility], you and your health needs are our priority.  As part of our continuing mission to provide you with exceptional heart care, we have created designated Provider Care Teams.  These Care Teams include your primary Cardiologist (physician) and Advanced Practice Providers (APPs -  Physician Assistants and Nurse Practitioners) who all work together to provide you with the care you need, when you need it.  We recommend signing up for the patient portal called "MyChart".  Sign up information is provided on this After Visit Summary.  MyChart is used to connect with patients for Virtual Visits (Telemedicine).  Patients are able to view lab/test results, encounter notes, upcoming appointments, etc.  Non-urgent messages can be sent to your provider as well.   To learn more about what you can do with MyChart, go to [email or web address].    Your next appointment:   Your physician wants you to follow-up in: June 2022 with [name] Kilts, PA-C. You will receive a reminder letter in the mail two months in advance. If you don't receive a letter, please call our office to schedule the follow-up appointment.  Remote monitoring is used to monitor your Pacemaker from home. This monitoring reduces the number of office visits required to check your device to one time per year. It allows [place] to keep an eye on the functioning of your device to ensure it  is working properly. You are scheduled for a device check from home on 03/07/20. You may send your transmission at any time that day. If you have a wireless device, the transmission will be sent automatically. After your physician reviews your transmission, you will receive a postcard with your next transmission date.  The format for your next appointment:   In Person with [name] Como "Jonni Sanger" Chalmers Cater, PA-C

## 2019-12-14 NOTE — Progress Notes (Signed)
Electrophysiology Office Note Date: 12/14/2019  ID:  Lenette, Rau June 15, 1929, MRN [id number]  PCP: [name], MD Primary Cardiologist: Peter Martinique, MD Electrophysiologist: Thompson Grayer, MD   CC: Pacemaker follow-up  [name] [name] is a 86 y.o. female seen today for Thompson Grayer, MD for routine electrophysiology followup.  Since last being seen in our clinic the patient reports doing very well. She has no complaints today.  she denies chest pain, palpitations, dyspnea, PND, orthopnea, nausea, vomiting, dizziness, syncope, edema, weight gain, or early satiety. She asks about her pocket due to her thin skin. Denies wounds or skin breakdown, pain, drainage, fevers, or chills.   Device History: Medtronic Dual Chamber PPM implanted 10/2016 for SSS/Tachy/Brady  Past Medical History:  Diagnosis Date  . CKD (chronic kidney disease)   . Complication of anesthesia   . Coronary artery disease    a. 12/20/2016: s/p DES to LAD   . Diverticulosis   . GERD (gastroesophageal reflux disease)   . Hemorrhoids   . Hyperlipidemia   . Hypertension   . Hypothyroidism   . Osteoarthritis   . Paroxysmal atrial fibrillation (HCC)    a. on [place] with Xarelto but switched to Eliquis after stenting.   Marland Kitchen PONV (postoperative nausea and vomiting)   . Presence of permanent cardiac pacemaker   . S/P TAVR (transcatheter aortic valve replacement) 01/29/2017   23 mm Edwards Sapien 3 transcatheter heart valve placed via percutaneous right transfemoral approach    Past Surgical History:  Procedure Laterality Date  . ABDOMINAL HYSTERECTOMY  1980  . BASAL CELL CARCINOMA EXCISION    . CARDIAC CATHETERIZATION  ~ 11/2016  . CATARACT EXTRACTION W/ INTRAOCULAR LENS  IMPLANT, BILATERAL Bilateral   . CORONARY ANGIOPLASTY WITH STENT PLACEMENT  12/20/2016   "1 stent"  . CORONARY STENT INTERVENTION N/A 12/20/2016   Procedure: CORONARY STENT INTERVENTION;  Surgeon: Martinique, Peter M, MD;  Location: [facility] [facility];  Service: Cardiovascular;  Laterality: N/A;  . FINGER SURGERY Left    "fell; developed skiers thumb; had to operate on it"  . INSERT / REPLACE / [place]   for syptomatic bradycardia and syncope -- in [facility]  . INTRAMEDULLARY (IM) NAIL INTERTROCHANTERIC Left 10/19/2018   Procedure: INTRAMEDULLARY (IM) NAIL INTERTROCHANTRIC;  Surgeon: Altamese Elmira, MD;  Location: [place];  Service: Orthopedics;  Laterality: Left;  . LEAD REVISION/REPAIR N/A 10/09/2016   New left subclavian MDT Adapta L PPM dual chamber system implanted by Dr [name] with previously placed R subclavian system abandoned  . PACEMAKER GENERATOR CHANGE  2001   pulse generator replacement by Dr. Rollene Fare   . PACEMAKER GENERATOR CHANGE  04/01/2008   PPM Medtronic -- model # [id number] serial # [id number] -- pulse generator replacement by Dr. Verlon Setting   . RIGHT/LEFT HEART CATH AND CORONARY ANGIOGRAPHY N/A 11/27/2016   Procedure: RIGHT/LEFT HEART CATH AND CORONARY ANGIOGRAPHY;  Surgeon: Martinique, Peter M, MD;  Location: [place] [facility];  Service: Cardiovascular;  Laterality: N/A;  . SQUAMOUS CELL CARCINOMA EXCISION    . TEE WITHOUT CARDIOVERSION N/A 01/29/2017   Procedure: TRANSESOPHAGEAL ECHOCARDIOGRAM (TEE);  Surgeon: Sherren Mocha, MD;  Location: [place];  Service: Open Heart Surgery;  Laterality: N/A;  . TONSILLECTOMY    . TRANSCATHETER AORTIC VALVE REPLACEMENT, TRANSFEMORAL N/A 01/29/2017   Procedure: TRANSCATHETER AORTIC VALVE REPLACEMENT, TRANSFEMORAL;  Surgeon: Sherren Mocha, MD;  Location: [place];  Service: Open Heart Surgery;  Laterality: N/A;    Current Outpatient Medications  Medication Sig Dispense Refill  . acetaminophen (TYLENOL) 325 MG tablet Take 2 tablets (650 mg total) by mouth every 6 (six) hours. 60 tablet 0  . alum & mag hydroxide-simeth (MAALOX/MYLANTA) 200-200-20 MG/5ML suspension Take 30 mLs by mouth every 4 (four) hours as needed for indigestion or heartburn. 355 mL 0  . amiodarone  (PACERONE) 200 MG tablet Take 0.5 tablets (100 mg total) by mouth daily. 45 tablet 3  . amLODipine (NORVASC) 5 MG tablet Take 1 tablet (5 mg total) by mouth daily. 90 tablet 3  . calcium carbonate (OS-CAL) 600 MG TABS Take 600 mg by mouth 2 (two) times daily with a meal.     . cholecalciferol (VITAMIN D) 25 MCG tablet Take 2 tablets (2,000 Units total) by mouth 2 (two) times daily. 60 tablet 2  . dicyclomine (BENTYL) 10 MG capsule Take 10 mg by mouth 2 (two) times daily as needed for spasms.   1  . ELIQUIS 2.5 MG TABS tablet TAKE 1 TABLET(2.5 MG) BY MOUTH TWICE DAILY 180 tablet 1  . ezetimibe (ZETIA) 10 MG tablet Take 1 tablet (10 mg total) by mouth daily. 90 tablet 3  . feeding supplement, ENSURE ENLIVE, (ENSURE ENLIVE) LIQD Take 237 mLs by mouth 2 (two) times daily between meals. 237 mL 12  . fluticasone (FLONASE) 50 MCG/ACT nasal spray Place 2 sprays into both nostrils as needed. Sinus clog    . furosemide (LASIX) 20 MG tablet TAKE 1 TABLET BY MOUTH EVERY DAY; EXCEPT ON MONDAY AND FRIDAY 90 tablet 3  . loratadine (CLARITIN) 10 MG tablet Take 10 mg by mouth daily as needed for allergies.    Marland Kitchen losartan (COZAAR) 25 MG tablet Take 1 tablet (25 mg total) by mouth daily. 90 tablet 3  . metoprolol tartrate (LOPRESSOR) 25 MG tablet TAKE 1 AND 1/2 TABLETS(37.5 MG) BY MOUTH TWICE DAILY 270 tablet 2  . Multiple Vitamins-Minerals (PRESERVISION AREDS 2) CAPS Take 1 capsule by mouth 2 (two) times daily.    . nitroGLYCERIN (NITROSTAT) 0.4 MG SL tablet Place 1 tablet (0.4 mg total) under the tongue every 5 (five) minutes as needed for chest pain. 25 tablet 12  . pantoprazole (PROTONIX) 40 MG tablet Take 1 tablet (40 mg total) by mouth 2 (two) times daily before a meal. 180 tablet 3  . polyethylene glycol (MIRALAX / GLYCOLAX) 17 g packet Take 17 g by mouth 2 (two) times daily. 14 each 0  . polyvinyl alcohol (LIQUIFILM TEARS) 1.4 % ophthalmic solution Place 1-2 drops into both eyes 3 (three) times daily as needed  for dry eyes.    . sodium chloride (OCEAN) 0.65 % SOLN nasal spray Place 1 spray into both nostrils as needed for congestion.    Marland Kitchen SYNTHROID 125 MCG tablet Take 125 mcg by mouth daily before breakfast.      No current facility-administered medications for this visit.    Allergies:   Penicillins, Tetanus toxoid, Demerol, Clindamycin/lincomycin, and Codeine   Social History: Social History   Socioeconomic History  . Marital status: Widowed    Spouse name: Not on file  . Number of children: 0  . Years of education: Not on file  . Highest education level: Not on file  Occupational History    Employer: RETIRED  Tobacco Use  . Smoking status: Former Smoker    Packs/day: 1.50    Years: 30.00    Pack years: 45.00    Types: Cigarettes    Quit date: 03/05/1978    Years  since quitting: 41.8  . Smokeless tobacco: Never Used  Vaping Use  . Vaping Use: Never used  Substance and Sexual Activity  . Alcohol use: No  . Drug use: No  . Sexual activity: Not on file  Other Topics Concern  . Not on file  Social History Narrative  . Not on file   Social Determinants of Health   Financial Resource Strain:   . Difficulty of Paying Living Expenses: Not on file  Food Insecurity:   . Worried About Charity fundraiser in the Last Year: Not on file  . Ran Out of Food in the Last Year: Not on file  Transportation Needs:   . Lack of Transportation (Medical): Not on file  . Lack of Transportation (Non-Medical): Not on file  Physical Activity:   . Days of Exercise per Week: Not on file  . Minutes of Exercise per Session: Not on file  Stress:   . Feeling of Stress : Not on file  Social Connections:   . Frequency of Communication with Friends and Family: Not on file  . Frequency of Social Gatherings with Friends and Family: Not on file  . Attends Religious Services: Not on file  . Active Member of Clubs or Organizations: Not on file  . Attends Archivist Meetings: Not on file  .  Marital Status: Not on file  Intimate Partner Violence:   . Fear of Current or Ex-Partner: Not on file  . Emotionally Abused: Not on file  . Physically Abused: Not on file  . Sexually Abused: Not on file    Family History: Family History  Problem Relation Age of Onset  . Cancer Father        lung cancer  . Alzheimer's disease Mother      Review of Systems: All other systems reviewed and are otherwise negative except as noted above.  Physical Exam: Vitals:   12/14/19 0956  BP: (!) 130/52  Pulse: 68  SpO2: 98%  Weight: 129 lb 9.6 oz (58.8 kg)  Height: 5' 5"  (1.651 m)     GEN- The patient is well appearing, alert and oriented x 3 today.   HEENT: normocephalic, atraumatic; sclera clear, conjunctiva pink; hearing intact; oropharynx clear; neck supple  Lungs- Clear to ausculation bilaterally, normal work of breathing.  No wheezes, rales, rhonchi Heart- Regular rate and rhythm, no murmurs, rubs or gallops  GI- soft, non-tender, non-distended, bowel sounds present  Extremities- no clubbing or cyanosis. No edema MS- no significant deformity or atrophy Skin- warm and dry, no rash or lesion; PPM pocket well healed Psych- euthymic mood, full affect Neuro- strength and sensation are intact  PPM Interrogation- reviewed in detail today,  See PACEART report  EKG:  EKG is not ordered today.  Recent Labs: No results found for requested labs within last 8760 hours.   Wt Readings from Last 3 Encounters:  12/14/19 129 lb 9.6 oz (58.8 kg)  08/17/19 129 lb (58.5 kg)  07/29/19 129 lb (58.5 kg)     Other studies Reviewed: Additional studies/ records that were reviewed today include: Previous EP office notes, Previous remote checks, Most recent labwork.   Assessment and Plan:  1. Symptomatic bradycardia s/p Medtronic PPM  Normal PPM function See Pace Art report No changes today Pt is thin skinned and the can and leads can be felt through the skin, which is chronic for her. She  denies any wounds, drainage, or skin breakdown. Her skin moves freely across the surface  of her device and leads. No signs of inflammatory changes.   2. Persistent afib Maintaining sinus with amiodarone 154m daily Surveillance labs today.  We discussed the importance of close monitoring on amiodarone to avoid toxicity.  She has previously been offered to stop (No [facility] in over [facility] or reduce to 534mdaily, but prefers to continue current therapy  Continue eliquis for CHA2DS2VASC of at least 6.    3. Hypertensive cardiovascular disease Continue current medications.   4. S/p TAVR Stable.   5. CAD No ischemic symptoms.   6. HL Continue zetia.   7. Chronic diastolic dysfunction Stable. Volume status looks good on exam.   Current medicines are reviewed at length with the patient today.   The patient does not have concerns regarding her medicines.  The following changes were made today:  none  Labs/ tests ordered today include:  Orders Placed This Encounter  Procedures  . Comp Met (CMET)  . TSH  . CBC w/Diff  . Magnesium   Disposition:   Follow up with EP APP in 08/2020 to stagger 6 mo follow up with AF clinic.   SiJacalyn LefevrePA-C  12/14/2019 10:27 AM  [place] [phone]) [phone])

## 2019-12-15 DIAGNOSIS — I480 Paroxysmal atrial fibrillation: Secondary | ICD-10-CM | POA: Diagnosis not present

## 2019-12-15 DIAGNOSIS — Z0000 Encounter for general adult medical examination without abnormal findings: Secondary | ICD-10-CM | POA: Diagnosis not present

## 2019-12-15 DIAGNOSIS — H9193 Unspecified hearing loss, bilateral: Secondary | ICD-10-CM | POA: Diagnosis not present

## 2019-12-15 DIAGNOSIS — I10 Essential (primary) hypertension: Secondary | ICD-10-CM | POA: Diagnosis not present

## 2019-12-15 DIAGNOSIS — J449 Chronic obstructive pulmonary disease, unspecified: Secondary | ICD-10-CM | POA: Diagnosis not present

## 2019-12-15 DIAGNOSIS — I130 Hypertensive heart and chronic kidney disease with heart failure and stage 1 through stage 4 chronic kidney disease, or unspecified chronic kidney disease: Secondary | ICD-10-CM | POA: Diagnosis not present

## 2019-12-15 DIAGNOSIS — I350 Nonrheumatic aortic (valve) stenosis: Secondary | ICD-10-CM | POA: Diagnosis not present

## 2019-12-15 DIAGNOSIS — I2721 Secondary pulmonary arterial hypertension: Secondary | ICD-10-CM | POA: Diagnosis not present

## 2019-12-15 DIAGNOSIS — N1831 Chronic kidney disease, stage 3a: Secondary | ICD-10-CM | POA: Diagnosis not present

## 2019-12-15 DIAGNOSIS — Z23 Encounter for immunization: Secondary | ICD-10-CM | POA: Diagnosis not present

## 2019-12-15 DIAGNOSIS — I4891 Unspecified atrial fibrillation: Secondary | ICD-10-CM | POA: Diagnosis not present

## 2019-12-15 DIAGNOSIS — H539 Unspecified visual disturbance: Secondary | ICD-10-CM | POA: Diagnosis not present

## 2019-12-15 DIAGNOSIS — Z7901 Long term (current) use of anticoagulants: Secondary | ICD-10-CM | POA: Diagnosis not present

## 2019-12-15 DIAGNOSIS — S81801D Unspecified open wound, right lower leg, subsequent encounter: Secondary | ICD-10-CM | POA: Diagnosis not present

## 2019-12-15 DIAGNOSIS — I872 Venous insufficiency (chronic) (peripheral): Secondary | ICD-10-CM | POA: Diagnosis not present

## 2019-12-15 DIAGNOSIS — R82998 Other abnormal findings in urine: Secondary | ICD-10-CM | POA: Diagnosis not present

## 2019-12-15 DIAGNOSIS — M1991 Primary osteoarthritis, unspecified site: Secondary | ICD-10-CM | POA: Diagnosis not present

## 2019-12-15 DIAGNOSIS — I2510 Atherosclerotic heart disease of native coronary artery without angina pectoris: Secondary | ICD-10-CM | POA: Diagnosis not present

## 2019-12-15 DIAGNOSIS — I5030 Unspecified diastolic (congestive) heart failure: Secondary | ICD-10-CM | POA: Diagnosis not present

## 2019-12-15 DIAGNOSIS — R569 Unspecified convulsions: Secondary | ICD-10-CM | POA: Diagnosis not present

## 2019-12-17 DIAGNOSIS — R569 Unspecified convulsions: Secondary | ICD-10-CM | POA: Diagnosis not present

## 2019-12-17 DIAGNOSIS — I10 Essential (primary) hypertension: Secondary | ICD-10-CM | POA: Diagnosis not present

## 2019-12-17 DIAGNOSIS — I872 Venous insufficiency (chronic) (peripheral): Secondary | ICD-10-CM | POA: Diagnosis not present

## 2019-12-17 DIAGNOSIS — I2510 Atherosclerotic heart disease of native coronary artery without angina pectoris: Secondary | ICD-10-CM | POA: Diagnosis not present

## 2019-12-17 DIAGNOSIS — H539 Unspecified visual disturbance: Secondary | ICD-10-CM | POA: Diagnosis not present

## 2019-12-17 DIAGNOSIS — H9193 Unspecified hearing loss, bilateral: Secondary | ICD-10-CM | POA: Diagnosis not present

## 2019-12-17 DIAGNOSIS — I4891 Unspecified atrial fibrillation: Secondary | ICD-10-CM | POA: Diagnosis not present

## 2019-12-17 DIAGNOSIS — M1991 Primary osteoarthritis, unspecified site: Secondary | ICD-10-CM | POA: Diagnosis not present

## 2019-12-17 DIAGNOSIS — S81801D Unspecified open wound, right lower leg, subsequent encounter: Secondary | ICD-10-CM | POA: Diagnosis not present

## 2019-12-20 DIAGNOSIS — M1991 Primary osteoarthritis, unspecified site: Secondary | ICD-10-CM | POA: Diagnosis not present

## 2019-12-20 DIAGNOSIS — I2510 Atherosclerotic heart disease of native coronary artery without angina pectoris: Secondary | ICD-10-CM | POA: Diagnosis not present

## 2019-12-20 DIAGNOSIS — I4891 Unspecified atrial fibrillation: Secondary | ICD-10-CM | POA: Diagnosis not present

## 2019-12-20 DIAGNOSIS — S81801D Unspecified open wound, right lower leg, subsequent encounter: Secondary | ICD-10-CM | POA: Diagnosis not present

## 2019-12-20 DIAGNOSIS — H539 Unspecified visual disturbance: Secondary | ICD-10-CM | POA: Diagnosis not present

## 2019-12-20 DIAGNOSIS — I872 Venous insufficiency (chronic) (peripheral): Secondary | ICD-10-CM | POA: Diagnosis not present

## 2019-12-20 DIAGNOSIS — R569 Unspecified convulsions: Secondary | ICD-10-CM | POA: Diagnosis not present

## 2019-12-20 DIAGNOSIS — H9193 Unspecified hearing loss, bilateral: Secondary | ICD-10-CM | POA: Diagnosis not present

## 2019-12-20 DIAGNOSIS — I10 Essential (primary) hypertension: Secondary | ICD-10-CM | POA: Diagnosis not present

## 2019-12-30 DIAGNOSIS — I872 Venous insufficiency (chronic) (peripheral): Secondary | ICD-10-CM | POA: Diagnosis not present

## 2019-12-30 DIAGNOSIS — I4891 Unspecified atrial fibrillation: Secondary | ICD-10-CM | POA: Diagnosis not present

## 2019-12-30 DIAGNOSIS — I10 Essential (primary) hypertension: Secondary | ICD-10-CM | POA: Diagnosis not present

## 2019-12-30 DIAGNOSIS — I2510 Atherosclerotic heart disease of native coronary artery without angina pectoris: Secondary | ICD-10-CM | POA: Diagnosis not present

## 2019-12-30 DIAGNOSIS — M1991 Primary osteoarthritis, unspecified site: Secondary | ICD-10-CM | POA: Diagnosis not present

## 2019-12-30 DIAGNOSIS — H9193 Unspecified hearing loss, bilateral: Secondary | ICD-10-CM | POA: Diagnosis not present

## 2019-12-30 DIAGNOSIS — S81801D Unspecified open wound, right lower leg, subsequent encounter: Secondary | ICD-10-CM | POA: Diagnosis not present

## 2019-12-30 DIAGNOSIS — R569 Unspecified convulsions: Secondary | ICD-10-CM | POA: Diagnosis not present

## 2019-12-30 DIAGNOSIS — H539 Unspecified visual disturbance: Secondary | ICD-10-CM | POA: Diagnosis not present

## 2020-01-06 DIAGNOSIS — I2510 Atherosclerotic heart disease of native coronary artery without angina pectoris: Secondary | ICD-10-CM | POA: Diagnosis not present

## 2020-01-06 DIAGNOSIS — I4891 Unspecified atrial fibrillation: Secondary | ICD-10-CM | POA: Diagnosis not present

## 2020-01-06 DIAGNOSIS — H539 Unspecified visual disturbance: Secondary | ICD-10-CM | POA: Diagnosis not present

## 2020-01-06 DIAGNOSIS — M1991 Primary osteoarthritis, unspecified site: Secondary | ICD-10-CM | POA: Diagnosis not present

## 2020-01-06 DIAGNOSIS — H9193 Unspecified hearing loss, bilateral: Secondary | ICD-10-CM | POA: Diagnosis not present

## 2020-01-06 DIAGNOSIS — S81801D Unspecified open wound, right lower leg, subsequent encounter: Secondary | ICD-10-CM | POA: Diagnosis not present

## 2020-01-06 DIAGNOSIS — R569 Unspecified convulsions: Secondary | ICD-10-CM | POA: Diagnosis not present

## 2020-01-06 DIAGNOSIS — I10 Essential (primary) hypertension: Secondary | ICD-10-CM | POA: Diagnosis not present

## 2020-01-06 DIAGNOSIS — I872 Venous insufficiency (chronic) (peripheral): Secondary | ICD-10-CM | POA: Diagnosis not present

## 2020-01-12 DIAGNOSIS — M1712 Unilateral primary osteoarthritis, left knee: Secondary | ICD-10-CM | POA: Diagnosis not present

## 2020-01-14 DIAGNOSIS — H539 Unspecified visual disturbance: Secondary | ICD-10-CM | POA: Diagnosis not present

## 2020-01-14 DIAGNOSIS — I4891 Unspecified atrial fibrillation: Secondary | ICD-10-CM | POA: Diagnosis not present

## 2020-01-14 DIAGNOSIS — I10 Essential (primary) hypertension: Secondary | ICD-10-CM | POA: Diagnosis not present

## 2020-01-14 DIAGNOSIS — M1991 Primary osteoarthritis, unspecified site: Secondary | ICD-10-CM | POA: Diagnosis not present

## 2020-01-14 DIAGNOSIS — I872 Venous insufficiency (chronic) (peripheral): Secondary | ICD-10-CM | POA: Diagnosis not present

## 2020-01-14 DIAGNOSIS — S81801D Unspecified open wound, right lower leg, subsequent encounter: Secondary | ICD-10-CM | POA: Diagnosis not present

## 2020-01-14 DIAGNOSIS — R569 Unspecified convulsions: Secondary | ICD-10-CM | POA: Diagnosis not present

## 2020-01-14 DIAGNOSIS — I2510 Atherosclerotic heart disease of native coronary artery without angina pectoris: Secondary | ICD-10-CM | POA: Diagnosis not present

## 2020-01-14 DIAGNOSIS — H9193 Unspecified hearing loss, bilateral: Secondary | ICD-10-CM | POA: Diagnosis not present

## 2020-02-01 ENCOUNTER — Other Ambulatory Visit: Payer: Self-pay | Admitting: Cardiology

## 2020-02-01 ENCOUNTER — Ambulatory Visit (HOSPITAL_COMMUNITY): Payer: Medicare PPO | Admitting: Nurse Practitioner

## 2020-02-02 ENCOUNTER — Other Ambulatory Visit: Payer: Self-pay | Admitting: Internal Medicine

## 2020-02-02 DIAGNOSIS — R1013 Epigastric pain: Secondary | ICD-10-CM | POA: Diagnosis not present

## 2020-02-03 ENCOUNTER — Other Ambulatory Visit: Payer: Self-pay

## 2020-02-03 ENCOUNTER — Ambulatory Visit (HOSPITAL_COMMUNITY)
Admission: RE | Admit: 2020-02-03 | Discharge: 2020-02-03 | Disposition: A | Discharge status: Home / Self Care | Payer: Medicare PPO | Source: Ambulatory Visit | Attending: Internal Medicine | Admitting: Internal Medicine

## 2020-02-03 DIAGNOSIS — R1013 Epigastric pain: Secondary | ICD-10-CM | POA: Diagnosis not present

## 2020-02-03 DIAGNOSIS — R109 Unspecified abdominal pain: Secondary | ICD-10-CM | POA: Diagnosis not present

## 2020-02-05 ENCOUNTER — Other Ambulatory Visit: Payer: Self-pay | Admitting: Cardiology

## 2020-02-08 DIAGNOSIS — I10 Essential (primary) hypertension: Secondary | ICD-10-CM | POA: Diagnosis not present

## 2020-02-08 DIAGNOSIS — M1991 Primary osteoarthritis, unspecified site: Secondary | ICD-10-CM | POA: Diagnosis not present

## 2020-02-08 DIAGNOSIS — I4891 Unspecified atrial fibrillation: Secondary | ICD-10-CM | POA: Diagnosis not present

## 2020-02-08 DIAGNOSIS — I872 Venous insufficiency (chronic) (peripheral): Secondary | ICD-10-CM | POA: Diagnosis not present

## 2020-02-08 DIAGNOSIS — H539 Unspecified visual disturbance: Secondary | ICD-10-CM | POA: Diagnosis not present

## 2020-02-08 DIAGNOSIS — H9193 Unspecified hearing loss, bilateral: Secondary | ICD-10-CM | POA: Diagnosis not present

## 2020-02-08 DIAGNOSIS — R569 Unspecified convulsions: Secondary | ICD-10-CM | POA: Diagnosis not present

## 2020-02-08 DIAGNOSIS — I2510 Atherosclerotic heart disease of native coronary artery without angina pectoris: Secondary | ICD-10-CM | POA: Diagnosis not present

## 2020-02-08 DIAGNOSIS — S81801D Unspecified open wound, right lower leg, subsequent encounter: Secondary | ICD-10-CM | POA: Diagnosis not present

## 2020-02-15 DIAGNOSIS — S81811A Laceration without foreign body, right lower leg, initial encounter: Secondary | ICD-10-CM | POA: Diagnosis not present

## 2020-02-15 DIAGNOSIS — I872 Venous insufficiency (chronic) (peripheral): Secondary | ICD-10-CM | POA: Diagnosis not present

## 2020-02-15 DIAGNOSIS — L0889 Other specified local infections of the skin and subcutaneous tissue: Secondary | ICD-10-CM | POA: Diagnosis not present

## 2020-02-18 DIAGNOSIS — Z1212 Encounter for screening for malignant neoplasm of rectum: Secondary | ICD-10-CM | POA: Diagnosis not present

## 2020-03-03 NOTE — Progress Notes (Signed)
[name] [name] Date of Birth: Apr 02, 1929 Medical Record [id number]  History of Present Illness: [name] [name] is seen today for follow up CAD and AV stenosis. She is s/p  PCI with DES to LAD on December 20, 2016.   She has a history of paroxysmal atrial fibrillation with tachy-brady syndrome and is s/p pacemaker implant. She is on chronic anticoagulation with Eliquis. Echo in July 2015 showed moderate aortic stenosis that had progressed since 2012. Repeat in July 2016 and July 2017 showed no change. In April 2017 she had atrial fibrillation that converted spontaneously. This was felt to be triggered by a steroid injection. Her metoprolol was increased at that time. When seen in June 2017 she was noted to be in persistent Afib. We placed her on Flecainide but this was later stopped due to side effects. She was seen in November with recurrent and persistent Afib. Rate was controlled but she was symptomatic. Seen in Afib clinic and started on amiodarone with subsequent conversion to NSR.   She later developed symptoms of dyspnea.   She did have a upper and lower EGD which was negative except for few polyps. She had PFTs in December that showed mild obstructive defect with marked decrease in diffusion capacity. CT of the Abdomen was negative for any pathology. She reports Dr. Virgina Jock did an [place] as well.  Myoview study was normal. When seen by Dr. [name] in late July it was noted that she had failure of her RA lead. Her device was reprogrammed but she developed pectoral stimulation. She underwent explantation of her old generator and placement of a new left chest dual chamber pacemaker on October 09 2016. Because of her ongoing symptoms we recommended a right and left heart cath and this was done on 11/27/16. This demonstrated severe AS as well as a severe stenosis in the mid LAD. She subsequently underwent successful PCI of the LAD with DES on 12/20/16 with 2.75 x 16 mm Promus stent.   She underwent TAVR on November  27,2018  with a #23 Edwards Sapien valve. Post op course complicated by pericarditis and AFib with RVR she was treated with anti-inflammatories and Amiodarone. Later Eliquis resumed and ASA stopped. Still on Plavix for stent. Follow up Echo was [place]. Repeat Echo 01/22/18 still showed good results. Pacemaker check on 12/12/17 showed normal function without atrial high rate episodes. When seen in the valve clinic on 01/22/18 Plavix was discontinued.   Last pacemaker check  in October was normal. Amiodarone labs done then were normal. Offered the possibility of coming off amiodarone but she wanted to continue.  She did have a bout of abdominal pain in November. Had testing by Dr Virgina Jock without clear etiology. Abd [place] negative. She has recovered well from her hip surgery but still gets tired more easily.    Current Outpatient Medications on File Prior to Visit  Medication Sig Dispense Refill  . acetaminophen (TYLENOL) 325 MG tablet Take 2 tablets (650 mg total) by mouth every 6 (six) hours. 60 tablet 0  . alum & mag hydroxide-simeth (MAALOX/MYLANTA) 200-200-20 MG/5ML suspension Take 30 mLs by mouth every 4 (four) hours as needed for indigestion or heartburn. 355 mL 0  . amiodarone (PACERONE) 200 MG tablet Take 0.5 tablets (100 mg total) by mouth daily. 45 tablet 3  . amLODipine (NORVASC) 5 MG tablet TAKE 1 TABLET(5 MG) BY MOUTH DAILY 90 tablet 3  . calcium carbonate (OS-CAL) 600 MG TABS Take 600 mg by mouth 2 (two) times daily  with a meal.    . dicyclomine (BENTYL) 10 MG capsule Take 10 mg by mouth 2 (two) times daily as needed for spasms.   1  . ELIQUIS 2.5 MG TABS tablet TAKE 1 TABLET(2.5 MG) BY MOUTH TWICE DAILY 180 tablet 1  . ezetimibe (ZETIA) 10 MG tablet TAKE 1 TABLET(10 MG) BY MOUTH DAILY 90 tablet 3  . feeding supplement, ENSURE ENLIVE, (ENSURE ENLIVE) LIQD Take 237 mLs by mouth 2 (two) times daily between meals. 237 mL 12  . fluticasone (FLONASE) 50 MCG/ACT nasal spray Place 2 sprays into both  nostrils as needed. Sinus clog    . furosemide (LASIX) 20 MG tablet TAKE 1 TABLET BY MOUTH EVERY DAY; EXCEPT ON MONDAY AND FRIDAY 90 tablet 3  . loratadine (CLARITIN) 10 MG tablet Take 10 mg by mouth daily as needed for allergies.    Marland Kitchen losartan (COZAAR) 25 MG tablet Take 1 tablet (25 mg total) by mouth daily. 90 tablet 3  . metoprolol tartrate (LOPRESSOR) 25 MG tablet TAKE 1 AND 1/2 TABLETS(37.5 MG) BY MOUTH TWICE DAILY 270 tablet 2  . Multiple Vitamins-Minerals (PRESERVISION AREDS 2) CAPS Take 1 capsule by mouth 2 (two) times daily.    . nitroGLYCERIN (NITROSTAT) 0.4 MG SL tablet Place 1 tablet (0.4 mg total) under the tongue every 5 (five) minutes as needed for chest pain. 25 tablet 12  . pantoprazole (PROTONIX) 40 MG tablet Take 1 tablet (40 mg total) by mouth 2 (two) times daily before a meal. 180 tablet 3  . polyethylene glycol (MIRALAX / GLYCOLAX) 17 g packet Take 17 g by mouth 2 (two) times daily. 14 each 0  . polyvinyl alcohol (LIQUIFILM TEARS) 1.4 % ophthalmic solution Place 1-2 drops into both eyes 3 (three) times daily as needed for dry eyes.    . sodium chloride (OCEAN) 0.65 % SOLN nasal spray Place 1 spray into both nostrils as needed for congestion.    Marland Kitchen SYNTHROID 125 MCG tablet Take 125 mcg by mouth daily before breakfast.      No current facility-administered medications on file prior to visit.    Allergies  Allergen Reactions  . Penicillins Anaphylaxis and Other (See Comments)    Has patient had a PCN reaction causing immediate rash, facial/tongue/throat swelling, SOB or lightheadedness with hypotension: Yes Has patient had a PCN reaction causing severe rash involving mucus membranes or skin necrosis: No Has patient had a PCN reaction that required hospitalization: No Has patient had a PCN reaction occurring within the last 10 years: No If all of the above answers are "NO", then may proceed with Cephalosporin use.   . Tetanus Toxoid Anaphylaxis  . Demerol Other (See  Comments)    Severe nausea  . Clindamycin/Lincomycin   . Codeine Nausea Only and Other (See Comments)    Severe nausea    Past Medical History:  Diagnosis Date  . CKD (chronic kidney disease)   . Complication of anesthesia   . Coronary artery disease    a. 12/20/2016: s/p DES to LAD   . Diverticulosis   . GERD (gastroesophageal reflux disease)   . Hemorrhoids   . Hyperlipidemia   . Hypertension   . Hypothyroidism   . Osteoarthritis   . Paroxysmal atrial fibrillation (HCC)    a. on OAC with Xarelto but switched to Eliquis after stenting.   Marland Kitchen PONV (postoperative nausea and vomiting)   . Presence of permanent cardiac pacemaker   . S/P TAVR (transcatheter aortic valve replacement) 01/29/2017  23 mm Edwards Sapien 3 transcatheter heart valve placed via percutaneous right transfemoral approach     Past Surgical History:  Procedure Laterality Date  . ABDOMINAL HYSTERECTOMY  1980  . BASAL CELL CARCINOMA EXCISION    . CARDIAC CATHETERIZATION  ~ 11/2016  . CATARACT EXTRACTION W/ INTRAOCULAR LENS  IMPLANT, BILATERAL Bilateral   . CORONARY ANGIOPLASTY WITH STENT PLACEMENT  12/20/2016   "1 stent"  . CORONARY STENT INTERVENTION N/A 12/20/2016   Procedure: CORONARY STENT INTERVENTION;  Surgeon: Martinique, Melora Menon M, MD;  Location: [facility];  Service: Cardiovascular;  Laterality: N/A;  . FINGER SURGERY Left    "fell; developed skiers thumb; had to operate on it"  . INSERT / REPLACE / [place]   for syptomatic bradycardia and syncope -- in [facility]  . INTRAMEDULLARY (IM) NAIL INTERTROCHANTERIC Left 10/19/2018   Procedure: INTRAMEDULLARY (IM) NAIL INTERTROCHANTRIC;  Surgeon: Altamese Trinity, MD;  Location: [facility];  Service: Orthopedics;  Laterality: Left;  . LEAD REVISION/REPAIR N/A 10/09/2016   New left subclavian MDT Adapta L PPM dual chamber system implanted by Dr [name] with previously placed R subclavian system abandoned  . PACEMAKER GENERATOR CHANGE  2001   pulse  generator replacement by Dr. Rollene Fare   . PACEMAKER GENERATOR CHANGE  04/01/2008   PPM Medtronic -- model # [id number] serial # [id number] -- pulse generator replacement by Dr. Verlon Setting   . RIGHT/LEFT HEART CATH AND CORONARY ANGIOGRAPHY N/A 11/27/2016   Procedure: RIGHT/LEFT HEART CATH AND CORONARY ANGIOGRAPHY;  Surgeon: Martinique, Jaysten Essner M, MD;  Location: [facility];  Service: Cardiovascular;  Laterality: N/A;  . SQUAMOUS CELL CARCINOMA EXCISION    . TEE WITHOUT CARDIOVERSION N/A 01/29/2017   Procedure: TRANSESOPHAGEAL ECHOCARDIOGRAM (TEE);  Surgeon: Sherren Mocha, MD;  Location: [facility];  Service: Open Heart Surgery;  Laterality: N/A;  . TONSILLECTOMY    . TRANSCATHETER AORTIC VALVE REPLACEMENT, TRANSFEMORAL N/A 01/29/2017   Procedure: TRANSCATHETER AORTIC VALVE REPLACEMENT, TRANSFEMORAL;  Surgeon: Sherren Mocha, MD;  Location: [facility];  Service: Open Heart Surgery;  Laterality: N/A;    Social History   Tobacco Use  Smoking Status Former Smoker  . Packs/day: 1.50  . Years: 30.00  . Pack years: 45.00  . Types: Cigarettes  . Quit date: 03/05/1978  . Years since quitting: 42.0  Smokeless Tobacco Never Used    Social History   Substance and Sexual Activity  Alcohol Use No    Family History  Problem Relation Age of Onset  . Cancer Father        lung cancer  . Alzheimer's disease Mother     Review of Systems: As noted in history of present illness.  All other systems were reviewed and are negative.  Physical Exam: BP 122/60   Pulse 73   Temp (!) 96.1 F (35.6 C)   Ht 5\' 6"  (1.676 m)   Wt 126 lb 3.2 oz (57.2 kg)   SpO2 95%   BMI 20.37 kg/m  GENERAL:  Well appearing, elderly WF in NAD. Walks with walker.  HEENT:  PERRL, EOMI, sclera are clear. Oropharynx is clear. NECK:  No jugular venous distention, carotid upstroke brisk and symmetric, no bruits, no thyromegaly or adenopathy LUNGS:  Clear to auscultation bilaterally CHEST:  Unremarkable HEART:  RRR,  PMI not displaced  or sustained,S1 and S2 within normal limits, no S3, no S4: no clicks, no rubs, very soft systolic murmur at the apex. ABD:  Soft, nontender. BS +, no masses or  bruits. No hepatomegaly, no splenomegaly EXT:  2 + pulses throughout, tr edema, no cyanosis no clubbing SKIN:  Warm and dry.  No rashes NEURO:  Alert and oriented x 3. Cranial nerves II through XII intact. PSYCH:  Cognitively intact   LABORATORY DATA:  Lab Results  Component Value Date   WBC 6.6 12/14/2019   HGB 13.0 12/14/2019   HCT 39.6 12/14/2019   PLT 153 12/14/2019   GLUCOSE 79 12/14/2019   CHOL 219 (H) 02/05/2018   TRIG 70 02/05/2018   HDL 103 02/05/2018   LDLCALC 102 (H) 02/05/2018   ALT 13 12/14/2019   AST 25 12/14/2019   NA 142 12/14/2019   K 4.4 12/14/2019   CL 102 12/14/2019   CREATININE 1.33 (H) 12/14/2019   BUN 33 (H) 12/14/2019   CO2 28 12/14/2019   TSH 3.070 12/14/2019   INR 1.23 01/29/2017   HGBA1C 5.5 01/28/2017   Labs dated 07/15/15: cholesterol 229, triglycerides 111, HDL 76, LDL 131.  Dated 07/19/16: cholesterol 247, triglycerides 71, HDL 88, LDL 145. BUN 28, creatinine 1.3. Other chemistries, CBC and TSH normal. Dated 03/15/17: creatinine 1.7, Hgb 11.3. Dated 10/22/17: cholesterol 251, triglycerides 66, HDL 93, LDL 145. Hgb 11.7, creatinine 1.3. TSH and ALT normal. Dated 01/12/19: cholesterol 224, triglycerides 105, HDL 96, LDL 107.  Dated 06/15/19: BUN 36, creatinine 1.5. CMET otherwise normal. CBC and TSH normal.    Assessment / Plan: 1. Atrial fibrillation with sick sinus syndrome. Prior history of  persistent AFib.  Now on amiodarone 100 mg daily. Maintaining NSR. Continue long-term anticoagulation. On  Eliquis 2.5 mg bid.  Dose adjusted for age and renal function. Labs OK  2. Aortic stenosis-   symptomatic. Now s/p TAVR. Stable exam.  3. CAD with significant single vessel obstructive disease in the mid LAD. S/p PCI of the LAD with DES October 2018.  On Eliquis. No ASA due to bleeding  risk.  4. HTN- well controlled.  5. Hyperlipidemia. Intolerant of statins. Now on Zetia 10 mg daily.   6. Chronic diastolic CHF. She is well compensated. No significant edema  7. S/p femoral neck fracture with ORIF. Recovered.   Follow up in 6 months.

## 2020-03-05 ENCOUNTER — Other Ambulatory Visit: Payer: Self-pay | Admitting: Cardiology

## 2020-03-07 ENCOUNTER — Ambulatory Visit (INDEPENDENT_AMBULATORY_CARE_PROVIDER_SITE_OTHER): Payer: Medicare PPO

## 2020-03-07 DIAGNOSIS — I495 Sick sinus syndrome: Secondary | ICD-10-CM | POA: Diagnosis not present

## 2020-03-08 ENCOUNTER — Other Ambulatory Visit: Payer: Self-pay

## 2020-03-08 ENCOUNTER — Ambulatory Visit: Payer: Medicare PPO | Admitting: Cardiology

## 2020-03-08 ENCOUNTER — Encounter: Payer: Self-pay | Admitting: Cardiology

## 2020-03-08 ENCOUNTER — Ambulatory Visit (INDEPENDENT_AMBULATORY_CARE_PROVIDER_SITE_OTHER): Payer: Medicare PPO

## 2020-03-08 VITALS — BP 122/60 | HR 73 | Temp 96.1°F | Ht 66.0 in | Wt 126.2 lb

## 2020-03-08 DIAGNOSIS — I2510 Atherosclerotic heart disease of native coronary artery without angina pectoris: Secondary | ICD-10-CM

## 2020-03-08 DIAGNOSIS — I495 Sick sinus syndrome: Secondary | ICD-10-CM

## 2020-03-08 DIAGNOSIS — I350 Nonrheumatic aortic (valve) stenosis: Secondary | ICD-10-CM

## 2020-03-08 DIAGNOSIS — Z952 Presence of prosthetic heart valve: Secondary | ICD-10-CM

## 2020-03-08 DIAGNOSIS — I480 Paroxysmal atrial fibrillation: Secondary | ICD-10-CM

## 2020-03-08 LAB — CUP PACEART REMOTE DEVICE CHECK
Battery Impedance: 209 Ohm
Battery Remaining Longevity: 111 mo
Battery Voltage: 2.78 V
Brady Statistic AP VP Percent: 0 %
Brady Statistic AP VS Percent: 99 %
Brady Statistic AS VP Percent: 0 %
Brady Statistic AS VS Percent: 1 %
Date Time Interrogation Session: 20220103103923
Implantable Lead Implant Date: 20180807
Implantable Lead Implant Date: 20180807
Implantable Lead Location: 753859
Implantable Lead Location: 753860
Implantable Lead Model: 5076
Implantable Lead Model: 5076
Implantable Pulse Generator Implant Date: 20180807
Lead Channel Impedance Value: 326 Ohm
Lead Channel Impedance Value: 538 Ohm
Lead Channel Pacing Threshold Amplitude: 0.75 V
Lead Channel Pacing Threshold Amplitude: 0.75 V
Lead Channel Pacing Threshold Pulse Width: 0.4 ms
Lead Channel Pacing Threshold Pulse Width: 0.4 ms
Lead Channel Setting Pacing Amplitude: 2 V
Lead Channel Setting Pacing Amplitude: 2.5 V
Lead Channel Setting Pacing Pulse Width: 0.4 ms
Lead Channel Setting Sensing Sensitivity: 5.6 mV

## 2020-03-09 LAB — CUP PACEART REMOTE DEVICE CHECK
Battery Impedance: 209 Ohm
Battery Remaining Longevity: 111 mo
Battery Voltage: 2.78 V
Brady Statistic AP VP Percent: 0 %
Brady Statistic AP VS Percent: 99 %
Brady Statistic AS VP Percent: 0 %
Brady Statistic AS VS Percent: 1 %
Date Time Interrogation Session: 20220105105047
Implantable Lead Implant Date: 20180807
Implantable Lead Implant Date: 20180807
Implantable Lead Location: 753859
Implantable Lead Location: 753860
Implantable Lead Model: 5076
Implantable Lead Model: 5076
Implantable Pulse Generator Implant Date: 20180807
Lead Channel Impedance Value: 310 Ohm
Lead Channel Impedance Value: 540 Ohm
Lead Channel Pacing Threshold Amplitude: 0.75 V
Lead Channel Pacing Threshold Amplitude: 0.75 V
Lead Channel Pacing Threshold Pulse Width: 0.4 ms
Lead Channel Pacing Threshold Pulse Width: 0.4 ms
Lead Channel Setting Pacing Amplitude: 2 V
Lead Channel Setting Pacing Amplitude: 2.5 V
Lead Channel Setting Pacing Pulse Width: 0.4 ms
Lead Channel Setting Sensing Sensitivity: 5.6 mV

## 2020-03-21 NOTE — Progress Notes (Signed)
Remote pacemaker transmission.   

## 2020-03-22 NOTE — Progress Notes (Signed)
Remote pacemaker transmission.   

## 2020-04-03 ENCOUNTER — Other Ambulatory Visit: Payer: Self-pay | Admitting: Cardiology

## 2020-04-04 ENCOUNTER — Telehealth: Payer: Self-pay | Admitting: Cardiology

## 2020-04-04 NOTE — Telephone Encounter (Signed)
Pt c/o medication issue:  1. Name of Medication: amiodarone (PACERONE) 200 MG tablet    2. How are you currently taking this medication (dosage and times per day)? As prescribed.  3. Are you having a reaction (difficulty breathing--STAT)? No.  4. What is your medication issue? Patient states that her pharmacy called and said her medication was denied. Please advise.

## 2020-04-04 NOTE — Telephone Encounter (Signed)
Spoke with patient, [facility] called her back and said her Amiodarone would be ready after 4:45pm, if she has any further issues she will call back in the morning.

## 2020-04-15 DIAGNOSIS — N1831 Chronic kidney disease, stage 3a: Secondary | ICD-10-CM | POA: Diagnosis not present

## 2020-04-15 DIAGNOSIS — Z1152 Encounter for screening for COVID-19: Secondary | ICD-10-CM | POA: Diagnosis not present

## 2020-04-15 DIAGNOSIS — Z20828 Contact with and (suspected) exposure to other viral communicable diseases: Secondary | ICD-10-CM | POA: Diagnosis not present

## 2020-04-15 DIAGNOSIS — B349 Viral infection, unspecified: Secondary | ICD-10-CM | POA: Diagnosis not present

## 2020-04-15 DIAGNOSIS — I130 Hypertensive heart and chronic kidney disease with heart failure and stage 1 through stage 4 chronic kidney disease, or unspecified chronic kidney disease: Secondary | ICD-10-CM | POA: Diagnosis not present

## 2020-04-15 DIAGNOSIS — M1990 Unspecified osteoarthritis, unspecified site: Secondary | ICD-10-CM | POA: Diagnosis not present

## 2020-04-15 DIAGNOSIS — Z7901 Long term (current) use of anticoagulants: Secondary | ICD-10-CM | POA: Diagnosis not present

## 2020-04-15 DIAGNOSIS — I5030 Unspecified diastolic (congestive) heart failure: Secondary | ICD-10-CM | POA: Diagnosis not present

## 2020-04-15 DIAGNOSIS — R519 Headache, unspecified: Secondary | ICD-10-CM | POA: Diagnosis not present

## 2020-04-15 DIAGNOSIS — I480 Paroxysmal atrial fibrillation: Secondary | ICD-10-CM | POA: Diagnosis not present

## 2020-05-03 ENCOUNTER — Telehealth: Payer: Self-pay | Admitting: Cardiology

## 2020-05-03 MED ORDER — LOSARTAN POTASSIUM 25 MG PO TABS: 25.0000 mg | ORAL_TABLET | Freq: Every day | ORAL | 2 refills | Status: DC

## 2020-05-03 NOTE — Telephone Encounter (Signed)
Refills has been sent to pharmacy. 

## 2020-05-03 NOTE — Telephone Encounter (Signed)
°*  STAT* If patient is at the pharmacy, call can be transferred to refill team.   1. Which medications need to be refilled? (please list name of each medication and dose if known)  New prescription for Losartan  2. Which pharmacy/location (including street and city if local pharmacy) is medication to be sent to? [facility] [address], [place]  3. Do they need a 30 day or 90 day supply? 90 days and refills

## 2020-05-04 ENCOUNTER — Other Ambulatory Visit: Payer: Self-pay

## 2020-05-04 MED ORDER — LOSARTAN POTASSIUM 25 MG PO TABS: 25.0000 mg | ORAL_TABLET | Freq: Every day | ORAL | 1 refills | Status: DC

## 2020-06-03 ENCOUNTER — Telehealth: Payer: Self-pay | Admitting: Cardiology

## 2020-06-03 NOTE — Telephone Encounter (Signed)
Pt c/o BP issue: STAT if pt c/o blurred vision, one-sided weakness or slurred speech  1. What are your last 5 BP readings? Fluctuating   2. Are you having any other symptoms (ex. Dizziness, headache, blurred vision, passed out)? Dizziness, headache, blur vision  3. What is your BP issue? Afib, the blood pressure is up and down, patient stated she feels like she going to faint

## 2020-06-03 NOTE — Telephone Encounter (Signed)
Spoke with pt to request a manual transmission from pacemaker.  She stated she is familiar with how to send a transmission, but unable to send it right now.  She will send as soon as she can.  Advised of office hours and if not received before office closes we may not be able to get back to her until Monday.

## 2020-06-03 NOTE — Telephone Encounter (Signed)
Spoke to patient she stated she was feeling better this afternoon.[name]'s advice given.Advised I will send message to device team to check your pacemaker remotely.

## 2020-06-03 NOTE — Telephone Encounter (Signed)
These BP readings are within normal range. She does have a history of paroxysmal Afib and it is possible this could be the issue. It would be easy enough to check her pacemaker to see. Not sure about the oxygen readings. I wouldn't expect it to vary so much in a few minutes. I wonder if pulse ox is picking up pulse accurately.   Lenn Volker Martinique MD, [facility]

## 2020-06-03 NOTE — Telephone Encounter (Signed)
Answered call from call center, pt caregiver Vania Rea is calling stating that pt's BP/HR is "fluctuating and it is making her dizzy and have blurred vision". Her BP is 145/79 and within 20 min it is 128/79 HR 63 and O2 is also fluctuating it will be normal then down to84% then back to normal. Caregiver states that these numbers are not "normal for her" and she needs to be seen/evualuated today. She further states that this has been happening for the last 2 weeks, caregiver states that she did not tell her that this has been happening for 2 weeks until now. Informed caregiver that these numbers ar WNL and will forward message to RN/Dr Ellyn Hack for review. Please advise

## 2020-06-06 LAB — CUP PACEART REMOTE DEVICE CHECK
Battery Impedance: 210 Ohm
Battery Remaining Longevity: 110 mo
Battery Voltage: 2.78 V
Brady Statistic AP VP Percent: 0 %
Brady Statistic AP VS Percent: 99 %
Brady Statistic AS VP Percent: 0 %
Brady Statistic AS VS Percent: 1 %
Date Time Interrogation Session: 20220404083418
Implantable Lead Implant Date: 20180807
Implantable Lead Implant Date: 20180807
Implantable Lead Location: 753859
Implantable Lead Location: 753860
Implantable Lead Model: 5076
Implantable Lead Model: 5076
Implantable Pulse Generator Implant Date: 20180807
Lead Channel Impedance Value: 303 Ohm
Lead Channel Impedance Value: 493 Ohm
Lead Channel Pacing Threshold Amplitude: 0.75 V
Lead Channel Pacing Threshold Amplitude: 0.75 V
Lead Channel Pacing Threshold Pulse Width: 0.4 ms
Lead Channel Pacing Threshold Pulse Width: 0.4 ms
Lead Channel Setting Pacing Amplitude: 2 V
Lead Channel Setting Pacing Amplitude: 2.5 V
Lead Channel Setting Pacing Pulse Width: 0.4 ms
Lead Channel Setting Sensing Sensitivity: 5.6 mV

## 2020-06-06 NOTE — Telephone Encounter (Signed)
Pt called called in and stated she has few questions for [facility].  She state her bp is still up and down.  She would like to know the results of the remote check    Pt c/o BP issue: STAT if pt c/o blurred vision, one-sided weakness or slurred speech  1. What are your last 5 BP readings?  This morning -8:30- 164/80 hr 88-  This morning -12pm- 134/70 hr 84 Last night -119/70 hr 70   2. Are you having any other symptoms (ex. Dizziness, headache, blurred vision, passed out) She stated on Friday she felt like she was a little dizzy ,   3. What is your BP issue? She stated her bp is still up and down  Best number [phone]

## 2020-06-06 NOTE — Telephone Encounter (Signed)
If anything is making her feel so poor it is probably the amiodarone. I think she should stop this. Continue her other therapy. It may take a few weeks to feel better off amiodarone. We will see  Thoren Hosang Martinique MD, [facility]

## 2020-06-06 NOTE — Telephone Encounter (Signed)
Manual transmission received.  Normal device function.  Presenting rhythm AP/VS 78.   Pt does appear to have an Afib episode lasting 7.5 hours on 05/30/20.  Total AF burden is 0.2% which would not explain pt ongoing symptoms for 2 weeks.

## 2020-06-06 NOTE — Telephone Encounter (Signed)
Spoke with the patient who was calling with an update on her blood pressures and to make sure the transmission was received.   She stated that her blood pressure last night was 119/64 with a heart rate of 80.   This morning at 8:30 it was 164/80 and heart rate was 80. This was prior to her medications. At 12:30 pm it was 134/70 and heart rate was 84.   The patient currently takes: Amiodarone 100 mg, morning Amlodipine 5 mg, night Furosemide 20 mg, (not on Monday or Friday) Losartan 25 mg daily, morning Metoprolol 37.5 bid bid Eliquis 2.5 mg bid  She stated that she has had no energy for weeks. She denies swelling, chest pain and shortness of breath. She has been advised that her blood pressures are normal after he medications were taken. She stated that she feels like she needs to be seen by Dr. Martinique so that it "will make her feel better and not be anxious".

## 2020-06-06 NOTE — Telephone Encounter (Signed)
Patient called in this morning letting us know she will send that transmission and wants to make sure it gets to Peter Martinique.

## 2020-06-07 ENCOUNTER — Ambulatory Visit: Payer: Medicare PPO | Admitting: Cardiology

## 2020-06-07 ENCOUNTER — Other Ambulatory Visit: Payer: Self-pay

## 2020-06-07 ENCOUNTER — Encounter: Payer: Self-pay | Admitting: Cardiology

## 2020-06-07 VITALS — BP 122/68 | HR 80 | Ht 65.5 in | Wt 126.0 lb

## 2020-06-07 DIAGNOSIS — R42 Dizziness and giddiness: Secondary | ICD-10-CM | POA: Diagnosis not present

## 2020-06-07 DIAGNOSIS — E039 Hypothyroidism, unspecified: Secondary | ICD-10-CM | POA: Diagnosis not present

## 2020-06-07 DIAGNOSIS — Z952 Presence of prosthetic heart valve: Secondary | ICD-10-CM

## 2020-06-07 DIAGNOSIS — I4819 Other persistent atrial fibrillation: Secondary | ICD-10-CM | POA: Diagnosis not present

## 2020-06-07 DIAGNOSIS — I2510 Atherosclerotic heart disease of native coronary artery without angina pectoris: Secondary | ICD-10-CM | POA: Diagnosis not present

## 2020-06-07 DIAGNOSIS — Z950 Presence of cardiac pacemaker: Secondary | ICD-10-CM

## 2020-06-07 DIAGNOSIS — I480 Paroxysmal atrial fibrillation: Secondary | ICD-10-CM

## 2020-06-07 DIAGNOSIS — I10 Essential (primary) hypertension: Secondary | ICD-10-CM

## 2020-06-07 LAB — CUP PACEART REMOTE DEVICE CHECK
Battery Impedance: 210 Ohm
Battery Remaining Longevity: 110 mo
Battery Voltage: 2.78 V
Brady Statistic AP VP Percent: 0 %
Brady Statistic AP VS Percent: 99 %
Brady Statistic AS VP Percent: 0 %
Brady Statistic AS VS Percent: 1 %
Date Time Interrogation Session: 20220404083418
Implantable Lead Implant Date: 20180807
Implantable Lead Implant Date: 20180807
Implantable Lead Location: 753859
Implantable Lead Location: 753860
Implantable Lead Model: 5076
Implantable Lead Model: 5076
Implantable Pulse Generator Implant Date: 20180807
Lead Channel Impedance Value: 303 Ohm
Lead Channel Impedance Value: 493 Ohm
Lead Channel Pacing Threshold Amplitude: 0.75 V
Lead Channel Pacing Threshold Amplitude: 0.75 V
Lead Channel Pacing Threshold Pulse Width: 0.4 ms
Lead Channel Pacing Threshold Pulse Width: 0.4 ms
Lead Channel Setting Pacing Amplitude: 2 V
Lead Channel Setting Pacing Amplitude: 2.5 V
Lead Channel Setting Pacing Pulse Width: 0.4 ms
Lead Channel Setting Sensing Sensitivity: 5.6 mV

## 2020-06-07 NOTE — Telephone Encounter (Signed)
Spoke to patient [name]'s advice given.Stated she is afraid to stop but she will.She was told last time she had a echo she might have a small leak.She wanted to ask [name] if she needs to repeat echo and she wanted to know if she needs to see [name] before July appointment.Advised I will speak to [name] and call back.

## 2020-06-07 NOTE — Telephone Encounter (Signed)
Patient is calling to follow up with [name]'s nurse. She declined going into detail. States she will discuss further when she speaks with the nurse.

## 2020-06-07 NOTE — Telephone Encounter (Signed)
Returned call to patient she stated she would like to see [name].Stated she would feel better discussing her problems in person.[name] has a cancellation this afternoon at 4:00 pm.Advised to bring a list of all medications.

## 2020-06-07 NOTE — Progress Notes (Signed)
[name] [name] Date of Birth: 1929/09/20 Medical Record #[id number]  History of Present Illness: [name] is seen today for follow up CAD and AV stenosis. She is s/p  PCI with DES to LAD on December 20, 2016.   She has a history of paroxysmal atrial fibrillation with tachy-brady syndrome and is s/p pacemaker implant. She is on chronic anticoagulation with Eliquis. Echo in July 2015 showed moderate aortic stenosis that had progressed since 2012. Repeat in July 2016 and July 2017 showed no change. In April 2017 she had atrial fibrillation that converted spontaneously. This was felt to be triggered by a steroid injection. Her metoprolol was increased at that time. When seen in June 2017 she was noted to be in persistent Afib. We placed her on Flecainide but this was later stopped due to side effects. She was seen in November with recurrent and persistent Afib. Rate was controlled but she was symptomatic. Seen in Afib clinic and started on amiodarone with subsequent conversion to NSR.   She later developed symptoms of dyspnea.   She did have a upper and lower EGD which was negative except for few polyps. She had PFTs in December that showed mild obstructive defect with marked decrease in diffusion capacity. CT of the Abdomen was negative for any pathology. She reports Dr. Virgina Jock did an [place] as well.  Myoview study was normal. When seen by Dr. [name] in late July it was noted that she had failure of her RA lead. Her device was reprogrammed but she developed pectoral stimulation. She underwent explantation of her old generator and placement of a new left chest dual chamber pacemaker on October 09 2016. Because of her ongoing symptoms we recommended a right and left heart cath and this was done on 11/27/16. This demonstrated severe AS as well as a severe stenosis in the mid LAD. She subsequently underwent successful PCI of the LAD with DES on 12/20/16 with 2.75 x 16 mm Promus stent.   She underwent TAVR on November  27,2018  with a #23 Edwards Sapien valve. Post op course complicated by pericarditis and AFib with RVR she was treated with anti-inflammatories and Amiodarone. Later Eliquis resumed and ASA stopped. Still on Plavix for stent. Follow up Echo was [place]. Repeat Echo 01/22/18 still showed good results. Pacemaker check on 12/12/17 showed normal function without atrial high rate episodes. When seen in the valve clinic on 01/22/18 Plavix was discontinued.   Last pacemaker check  in October was normal. Amiodarone labs done then were normal. Offered the possibility of coming off amiodarone but she wanted to continue.  She did have a bout of abdominal pain in November. Had testing by Dr Virgina Jock without clear etiology. Abd [place] negative. She has recovered well from her hip surgery but still gets tired more easily.   On follow up today she complains of feeling weak and having spells where she feels like she might faint. This can occur with sitting or standing and may last seconds to a minute. Not positional. BP has fluctuated. May go up to 150-160 or down to 110. She did have pacemaker check yesterday showing she had an episode of AFib on 3/28 lasting 7.5 hours but she did not have symptoms during this time and really this is the first episode of AFib she has had in a long time.    Current Outpatient Medications on File Prior to Visit  Medication Sig Dispense Refill  . acetaminophen (TYLENOL) 325 MG tablet Take 2 tablets (650  mg total) by mouth every 6 (six) hours. 60 tablet 0  . alum & mag hydroxide-simeth (MAALOX/MYLANTA) 200-200-20 MG/5ML suspension Take 30 mLs by mouth every 4 (four) hours as needed for indigestion or heartburn. 355 mL 0  . amiodarone (PACERONE) 200 MG tablet TAKE 1/2 TABLET(100 MG) BY MOUTH DAILY 45 tablet 3  . amLODipine (NORVASC) 5 MG tablet TAKE 1 TABLET(5 MG) BY MOUTH DAILY 90 tablet 3  . calcium carbonate (OS-CAL) 600 MG TABS Take 600 mg by mouth 2 (two) times daily with a meal.    . ELIQUIS  2.5 MG TABS tablet TAKE 1 TABLET(2.5 MG) BY MOUTH TWICE DAILY 180 tablet 1  . ezetimibe (ZETIA) 10 MG tablet TAKE 1 TABLET(10 MG) BY MOUTH DAILY 90 tablet 3  . feeding supplement, ENSURE ENLIVE, (ENSURE ENLIVE) LIQD Take 237 mLs by mouth 2 (two) times daily between meals. 237 mL 12  . fluticasone (FLONASE) 50 MCG/ACT nasal spray Place 2 sprays into both nostrils as needed. Sinus clog    . furosemide (LASIX) 20 MG tablet TAKE 1 TABLET BY MOUTH EVERY DAY; EXCEPT ON MONDAY AND FRIDAY 90 tablet 3  . loratadine (CLARITIN) 10 MG tablet Take 10 mg by mouth daily as needed for allergies.    Marland Kitchen losartan (COZAAR) 25 MG tablet Take 1 tablet (25 mg total) by mouth daily. 90 tablet 1  . metoprolol tartrate (LOPRESSOR) 25 MG tablet TAKE 1 AND 1/2 TABLETS(37.5 MG) BY MOUTH TWICE DAILY 270 tablet 2  . Multiple Vitamins-Minerals (PRESERVISION AREDS 2) CAPS Take 1 capsule by mouth 2 (two) times daily.    . nitroGLYCERIN (NITROSTAT) 0.4 MG SL tablet Place 1 tablet (0.4 mg total) under the tongue every 5 (five) minutes as needed for chest pain. 25 tablet 12  . pantoprazole (PROTONIX) 40 MG tablet Take 1 tablet (40 mg total) by mouth 2 (two) times daily before a meal. 180 tablet 3  . polyethylene glycol (MIRALAX / GLYCOLAX) 17 g packet Take 17 g by mouth 2 (two) times daily. 14 each 0  . polyvinyl alcohol (LIQUIFILM TEARS) 1.4 % ophthalmic solution Place 1-2 drops into both eyes 3 (three) times daily as needed for dry eyes.    . sodium chloride (OCEAN) 0.65 % SOLN nasal spray Place 1 spray into both nostrils as needed for congestion.    Marland Kitchen SYNTHROID 125 MCG tablet Take 125 mcg by mouth daily before breakfast.      No current facility-administered medications on file prior to visit.    Allergies  Allergen Reactions  . Penicillins Anaphylaxis and Other (See Comments)    Has patient had a PCN reaction causing immediate rash, facial/tongue/throat swelling, SOB or lightheadedness with hypotension: Yes Has patient had a  PCN reaction causing severe rash involving mucus membranes or skin necrosis: No Has patient had a PCN reaction that required hospitalization: No Has patient had a PCN reaction occurring within the last 10 years: No If all of the above answers are "NO", then may proceed with Cephalosporin use.   . Tetanus Toxoid Anaphylaxis  . Demerol Other (See Comments)    Severe nausea  . Clindamycin/Lincomycin   . Codeine Nausea Only and Other (See Comments)    Severe nausea    Past Medical History:  Diagnosis Date  . CKD (chronic kidney disease)   . Complication of anesthesia   . Coronary artery disease    a. 12/20/2016: s/p DES to LAD   . Diverticulosis   . GERD (gastroesophageal reflux disease)   .  Hemorrhoids   . Hyperlipidemia   . Hypertension   . Hypothyroidism   . Osteoarthritis   . Paroxysmal atrial fibrillation (HCC)    a. on [place] with Xarelto but switched to Eliquis after stenting.   Marland Kitchen PONV (postoperative nausea and vomiting)   . Presence of permanent cardiac pacemaker   . S/P TAVR (transcatheter aortic valve replacement) 01/29/2017   23 mm Edwards Sapien 3 transcatheter heart valve placed via percutaneous right transfemoral approach     Past Surgical History:  Procedure Laterality Date  . ABDOMINAL HYSTERECTOMY  1980  . BASAL CELL CARCINOMA EXCISION    . CARDIAC CATHETERIZATION  ~ 11/2016  . CATARACT EXTRACTION W/ INTRAOCULAR LENS  IMPLANT, BILATERAL Bilateral   . CORONARY ANGIOPLASTY WITH STENT PLACEMENT  12/20/2016   "1 stent"  . CORONARY STENT INTERVENTION N/A 12/20/2016   Procedure: CORONARY STENT INTERVENTION;  Surgeon: Martinique, Mahogony Gilchrest M, MD;  Location: [facility];  Service: Cardiovascular;  Laterality: N/A;  . FINGER SURGERY Left    "fell; developed skiers thumb; had to operate on it"  . INSERT / REPLACE / [place]   for syptomatic bradycardia and syncope -- in [facility]  . INTRAMEDULLARY (IM) NAIL INTERTROCHANTERIC Left 10/19/2018   Procedure:  INTRAMEDULLARY (IM) NAIL INTERTROCHANTRIC;  Surgeon: Altamese Mapleton, MD;  Location: [facility];  Service: Orthopedics;  Laterality: Left;  . LEAD REVISION/REPAIR N/A 10/09/2016   New left subclavian MDT Adapta L PPM dual chamber system implanted by Dr [name] with previously placed R subclavian system abandoned  . PACEMAKER GENERATOR CHANGE  2001   pulse generator replacement by Dr. Rollene Fare   . PACEMAKER GENERATOR CHANGE  04/01/2008   PPM Medtronic -- model # ADDRL1 serial # [id number] -- pulse generator replacement by Dr. Verlon Setting   . RIGHT/LEFT HEART CATH AND CORONARY ANGIOGRAPHY N/A 11/27/2016   Procedure: RIGHT/LEFT HEART CATH AND CORONARY ANGIOGRAPHY;  Surgeon: Martinique, Shalin Linders M, MD;  Location: [facility];  Service: Cardiovascular;  Laterality: N/A;  . SQUAMOUS CELL CARCINOMA EXCISION    . TEE WITHOUT CARDIOVERSION N/A 01/29/2017   Procedure: TRANSESOPHAGEAL ECHOCARDIOGRAM (TEE);  Surgeon: Sherren Mocha, MD;  Location: [facility];  Service: Open Heart Surgery;  Laterality: N/A;  . TONSILLECTOMY    . TRANSCATHETER AORTIC VALVE REPLACEMENT, TRANSFEMORAL N/A 01/29/2017   Procedure: TRANSCATHETER AORTIC VALVE REPLACEMENT, TRANSFEMORAL;  Surgeon: Sherren Mocha, MD;  Location: [place];  Service: Open Heart Surgery;  Laterality: N/A;    Social History   Tobacco Use  Smoking Status Former Smoker  . Packs/day: 1.50  . Years: 30.00  . Pack years: 45.00  . Types: Cigarettes  . Quit date: 03/05/1978  . Years since quitting: 42.2  Smokeless Tobacco Never Used    Social History   Substance and Sexual Activity  Alcohol Use No    Family History  Problem Relation Age of Onset  . Cancer Father        lung cancer  . Alzheimer's disease Mother     Review of Systems: As noted in history of present illness.  All other systems were reviewed and are negative.  Physical Exam: BP 122/68   Pulse 80   Ht 5' 5.5" (1.664 m)   Wt 126 lb (57.2 kg)   SpO2 95%   BMI 20.65 kg/m  GENERAL:  Well  appearing, elderly WF in NAD. Walks with cane HEENT:  PERRL, EOMI, sclera are clear. Oropharynx is clear. NECK:  No jugular venous distention, carotid upstroke brisk  and symmetric, no bruits, no thyromegaly or adenopathy LUNGS:  Clear to auscultation bilaterally CHEST:  Unremarkable HEART:  RRR,  PMI not displaced or sustained,S1 and S2 within normal limits, no S3, no S4: no clicks, no rubs, very soft systolic murmur at the apex. ABD:  Soft, nontender. BS +, no masses or bruits. No hepatomegaly, no splenomegaly EXT:  2 + pulses throughout, tr edema, no cyanosis no clubbing SKIN:  Warm and dry.  No rashes NEURO:  Alert and oriented x 3. Cranial nerves II through XII intact. PSYCH:  Cognitively intact   LABORATORY DATA:  Lab Results  Component Value Date   WBC 6.6 12/14/2019   HGB 13.0 12/14/2019   HCT 39.6 12/14/2019   PLT 153 12/14/2019   GLUCOSE 79 12/14/2019   CHOL 219 (H) 02/05/2018   TRIG 70 02/05/2018   HDL 103 02/05/2018   LDLCALC 102 (H) 02/05/2018   ALT 13 12/14/2019   AST 25 12/14/2019   NA 142 12/14/2019   K 4.4 12/14/2019   CL 102 12/14/2019   CREATININE 1.33 (H) 12/14/2019   BUN 33 (H) 12/14/2019   CO2 28 12/14/2019   TSH 3.070 12/14/2019   INR 1.23 01/29/2017   HGBA1C 5.5 01/28/2017   Labs dated 07/15/15: cholesterol 229, triglycerides 111, HDL 76, LDL 131.  Dated 07/19/16: cholesterol 247, triglycerides 71, HDL 88, LDL 145. BUN 28, creatinine 1.3. Other chemistries, CBC and TSH normal. Dated 03/15/17: creatinine 1.7, Hgb 11.3. Dated 10/22/17: cholesterol 251, triglycerides 66, HDL 93, LDL 145. Hgb 11.7, creatinine 1.3. TSH and ALT normal. Dated 01/12/19: cholesterol 224, triglycerides 105, HDL 96, LDL 107.  Dated 06/15/19: BUN 36, creatinine 1.5. CMET otherwise normal. CBC and TSH normal.  Ecg today shows atrial paced rhythm rate 80. Otherwise normal. I have personally reviewed and interpreted this study.   Assessment / Plan: 1. Atrial fibrillation with sick  sinus syndrome. Prior history of  persistent AFib.  Now on amiodarone 100 mg daily. Maintaining NSR but did have an episode lasting several hours on 3/28. This did not correlate with symptoms. Continue long-term anticoagulation. On  Eliquis 2.5 mg bid.  Dose adjusted for age and renal function. Will update labs today.  2. Aortic stenosis-   symptomatic. Now s/p TAVR. Stable exam. Given symptoms will update Echo.   3. CAD with significant single vessel obstructive disease in the mid LAD. S/p PCI of the LAD with DES October 2018.  On Eliquis. No ASA due to bleeding risk.  4. HTN- well controlled but fluctuates. Will continue metoprolol Bid, take losartan in the am. Amlodipine in the pm.   5. Hyperlipidemia. Intolerant of statins. Now on Zetia 10 mg daily.   6. Chronic diastolic CHF. She is well compensated. No significant edema  7. S/p femoral neck fracture with ORIF. Recovered.   8. Generalized weakness and lightheadedness. Check labs and Echo. Monitor BP with above changes.   Follow up in 4 months.

## 2020-06-07 NOTE — Patient Instructions (Signed)
We will check lab work today  We will schedule you for an Echocardiogram  Take losartan in the morning and amlodipine in the evening.   Keep your other medications the same.

## 2020-06-08 LAB — CBC WITH DIFFERENTIAL/PLATELET
Basophils Absolute: 0.1 10*3/uL (ref 0.0–0.2)
Basos: 1 %
EOS (ABSOLUTE): 0.1 10*3/uL (ref 0.0–0.4)
Eos: 1 %
Hematocrit: 41.8 % (ref 34.0–46.6)
Hemoglobin: 14.3 g/dL (ref 11.1–15.9)
Immature Grans (Abs): 0 10*3/uL (ref 0.0–0.1)
Immature Granulocytes: 0 %
Lymphocytes Absolute: 1.7 10*3/uL (ref 0.7–3.1)
Lymphs: 25 %
MCH: 34.5 pg — ABNORMAL HIGH (ref 26.6–33.0)
MCHC: 34.2 g/dL (ref 31.5–35.7)
MCV: 101 fL — ABNORMAL HIGH (ref 79–97)
Monocytes Absolute: 0.5 10*3/uL (ref 0.1–0.9)
Monocytes: 7 %
Neutrophils Absolute: 4.3 10*3/uL (ref 1.4–7.0)
Neutrophils: 66 %
Platelets: 172 10*3/uL (ref 150–450)
RBC: 4.15 x10E6/uL (ref 3.77–5.28)
RDW: 12 % (ref 11.7–15.4)
WBC: 6.6 10*3/uL (ref 3.4–10.8)

## 2020-06-08 LAB — COMPREHENSIVE METABOLIC PANEL
ALT: 14 IU/L (ref 0–32)
AST: 28 IU/L (ref 0–40)
Albumin/Globulin Ratio: 2 (ref 1.2–2.2)
Albumin: 4.7 g/dL — ABNORMAL HIGH (ref 3.6–4.6)
Alkaline Phosphatase: 83 IU/L (ref 44–121)
BUN/Creatinine Ratio: 23 (ref 12–28)
BUN: 34 mg/dL — ABNORMAL HIGH (ref 8–27)
Bilirubin Total: 0.7 mg/dL (ref 0.0–1.2)
CO2: 20 mmol/L (ref 20–29)
Calcium: 9.9 mg/dL (ref 8.7–10.3)
Chloride: 99 mmol/L (ref 96–106)
Creatinine, Ser: 1.46 mg/dL — ABNORMAL HIGH (ref 0.57–1.00)
Globulin, Total: 2.4 g/dL (ref 1.5–4.5)
Glucose: 95 mg/dL (ref 65–99)
Potassium: 5.1 mmol/L (ref 3.5–5.2)
Sodium: 142 mmol/L (ref 134–144)
Total Protein: 7.1 g/dL (ref 6.0–8.5)
eGFR: 34 mL/min/{1.73_m2} — ABNORMAL LOW (ref >59)

## 2020-06-08 LAB — TSH: TSH: 4.08 u[IU]/mL (ref 0.450–4.500)

## 2020-06-10 ENCOUNTER — Other Ambulatory Visit: Payer: Self-pay

## 2020-06-16 DIAGNOSIS — H02055 Trichiasis without entropian left lower eyelid: Secondary | ICD-10-CM | POA: Diagnosis not present

## 2020-06-16 DIAGNOSIS — H0100A Unspecified blepharitis right eye, upper and lower eyelids: Secondary | ICD-10-CM | POA: Diagnosis not present

## 2020-06-16 DIAGNOSIS — H353132 Nonexudative age-related macular degeneration, bilateral, intermediate dry stage: Secondary | ICD-10-CM | POA: Diagnosis not present

## 2020-06-16 DIAGNOSIS — H52203 Unspecified astigmatism, bilateral: Secondary | ICD-10-CM | POA: Diagnosis not present

## 2020-06-20 ENCOUNTER — Other Ambulatory Visit: Payer: Self-pay

## 2020-06-20 MED ORDER — PANTOPRAZOLE SODIUM 40 MG PO TBEC: 40.0000 mg | DELAYED_RELEASE_TABLET | Freq: Two times a day (BID) | ORAL | 3 refills | Status: DC

## 2020-07-05 ENCOUNTER — Other Ambulatory Visit: Payer: Self-pay | Admitting: Cardiology

## 2020-07-06 DIAGNOSIS — M1712 Unilateral primary osteoarthritis, left knee: Secondary | ICD-10-CM | POA: Diagnosis not present

## 2020-07-07 DIAGNOSIS — I130 Hypertensive heart and chronic kidney disease with heart failure and stage 1 through stage 4 chronic kidney disease, or unspecified chronic kidney disease: Secondary | ICD-10-CM | POA: Diagnosis not present

## 2020-07-07 DIAGNOSIS — I2721 Secondary pulmonary arterial hypertension: Secondary | ICD-10-CM | POA: Diagnosis not present

## 2020-07-07 DIAGNOSIS — N1831 Chronic kidney disease, stage 3a: Secondary | ICD-10-CM | POA: Diagnosis not present

## 2020-07-07 DIAGNOSIS — I700 Atherosclerosis of aorta: Secondary | ICD-10-CM | POA: Diagnosis not present

## 2020-07-07 DIAGNOSIS — I5030 Unspecified diastolic (congestive) heart failure: Secondary | ICD-10-CM | POA: Diagnosis not present

## 2020-07-07 DIAGNOSIS — D692 Other nonthrombocytopenic purpura: Secondary | ICD-10-CM | POA: Diagnosis not present

## 2020-07-07 DIAGNOSIS — Z7901 Long term (current) use of anticoagulants: Secondary | ICD-10-CM | POA: Diagnosis not present

## 2020-07-07 DIAGNOSIS — J449 Chronic obstructive pulmonary disease, unspecified: Secondary | ICD-10-CM | POA: Diagnosis not present

## 2020-07-07 DIAGNOSIS — I480 Paroxysmal atrial fibrillation: Secondary | ICD-10-CM | POA: Diagnosis not present

## 2020-07-12 ENCOUNTER — Other Ambulatory Visit: Payer: Self-pay | Admitting: Cardiology

## 2020-07-13 NOTE — Telephone Encounter (Signed)
Prescription refill request for Eliquis received. Indication:atrial fib Last office visit:4/22  Martinique Scr:1.4  4/22 Age: 87 Weight:57.2 kg  Prescription refilled

## 2020-07-26 ENCOUNTER — Ambulatory Visit (HOSPITAL_COMMUNITY): Payer: Medicare PPO | Attending: Internal Medicine

## 2020-07-26 ENCOUNTER — Other Ambulatory Visit: Payer: Self-pay

## 2020-07-26 DIAGNOSIS — Z952 Presence of prosthetic heart valve: Secondary | ICD-10-CM | POA: Insufficient documentation

## 2020-07-26 DIAGNOSIS — E039 Hypothyroidism, unspecified: Secondary | ICD-10-CM | POA: Diagnosis not present

## 2020-07-26 DIAGNOSIS — I4819 Other persistent atrial fibrillation: Secondary | ICD-10-CM | POA: Insufficient documentation

## 2020-07-26 DIAGNOSIS — I480 Paroxysmal atrial fibrillation: Secondary | ICD-10-CM | POA: Insufficient documentation

## 2020-07-26 DIAGNOSIS — R42 Dizziness and giddiness: Secondary | ICD-10-CM | POA: Diagnosis not present

## 2020-07-26 DIAGNOSIS — I2510 Atherosclerotic heart disease of native coronary artery without angina pectoris: Secondary | ICD-10-CM | POA: Insufficient documentation

## 2020-07-26 LAB — ECHOCARDIOGRAM COMPLETE
AR max vel: 1.48 cm2
AV Area VTI: 1.49 cm2
AV Area mean vel: 1.5 cm2
AV Mean grad: 8 mmHg
AV Peak grad: 16.9 mmHg
Ao pk vel: 2.06 m/s
Area-P 1/2: 2.99 cm2
P 1/2 time: 412 msec
S' Lateral: 2.4 cm

## 2020-08-08 DIAGNOSIS — Z1231 Encounter for screening mammogram for malignant neoplasm of breast: Secondary | ICD-10-CM | POA: Diagnosis not present

## 2020-08-10 ENCOUNTER — Other Ambulatory Visit: Payer: Self-pay

## 2020-08-10 ENCOUNTER — Encounter: Payer: Self-pay | Admitting: Internal Medicine

## 2020-08-10 ENCOUNTER — Ambulatory Visit: Payer: Medicare PPO | Admitting: Internal Medicine

## 2020-08-10 VITALS — BP 132/68 | HR 81 | Ht 66.0 in | Wt 126.4 lb

## 2020-08-10 DIAGNOSIS — E785 Hyperlipidemia, unspecified: Secondary | ICD-10-CM

## 2020-08-10 DIAGNOSIS — I4819 Other persistent atrial fibrillation: Secondary | ICD-10-CM | POA: Diagnosis not present

## 2020-08-10 DIAGNOSIS — Z952 Presence of prosthetic heart valve: Secondary | ICD-10-CM

## 2020-08-10 DIAGNOSIS — I5032 Chronic diastolic (congestive) heart failure: Secondary | ICD-10-CM

## 2020-08-10 DIAGNOSIS — I2510 Atherosclerotic heart disease of native coronary artery without angina pectoris: Secondary | ICD-10-CM | POA: Diagnosis not present

## 2020-08-10 DIAGNOSIS — I10 Essential (primary) hypertension: Secondary | ICD-10-CM

## 2020-08-10 DIAGNOSIS — R001 Bradycardia, unspecified: Secondary | ICD-10-CM

## 2020-08-10 NOTE — Patient Instructions (Signed)
Medication Instructions:  Your physician recommends that you continue on your current medications as directed. Please refer to the Current Medication list given to you today.  Labwork: None ordered.  Testing/Procedures: None ordered.  Follow-Up: Your physician wants you to follow-up in: 12 months with Thompson Grayer, MD or one of the following Advanced Practice Providers on your designated Care Team:     Tommye Standard, PA-C     You will receive a reminder letter in the mail two months in advance. If you don't receive a letter, please call our office to schedule the follow-up appointment.  Remote monitoring is used to monitor your Pacemaker from home. This monitoring reduces the number of office visits required to check your device to one time per year. It allows [place] to keep an eye on the functioning of your device to ensure it is working properly. You are scheduled for a device check from home on 09/06/20. You may send your transmission at any time that day. If you have a wireless device, the transmission will be sent automatically. After your physician reviews your transmission, you will receive a postcard with your next transmission date.  Any Other Special Instructions Will Be Listed Below (If Applicable).  If you need a refill on your cardiac medications before your next appointment, please call your pharmacy.

## 2020-08-10 NOTE — Progress Notes (Signed)
PCP: [name], MD Primary Cardiologist: Dr Martinique Primary EP:  Dr Aurea Graff [name] [name] is a 87 y.o. female who presents today for routine electrophysiology followup.  Since last being seen in our clinic, the patient reports doing very well.  Today, she denies symptoms of palpitations, chest pain, shortness of breath,  lower extremity edema, dizziness, presyncope, or syncope.  The patient is otherwise without complaint today.   Past Medical History:  Diagnosis Date  . CKD (chronic kidney disease)   . Complication of anesthesia   . Coronary artery disease    a. 12/20/2016: s/p DES to LAD   . Diverticulosis   . GERD (gastroesophageal reflux disease)   . Hemorrhoids   . Hyperlipidemia   . Hypertension   . Hypothyroidism   . Osteoarthritis   . Paroxysmal atrial fibrillation (HCC)    a. on [place] with Xarelto but switched to Eliquis after stenting.   Marland Kitchen PONV (postoperative nausea and vomiting)   . Presence of permanent cardiac pacemaker   . S/P TAVR (transcatheter aortic valve replacement) 01/29/2017   23 mm Edwards Sapien 3 transcatheter heart valve placed via percutaneous right transfemoral approach    Past Surgical History:  Procedure Laterality Date  . ABDOMINAL HYSTERECTOMY  1980  . BASAL CELL CARCINOMA EXCISION    . CARDIAC CATHETERIZATION  ~ 11/2016  . CATARACT EXTRACTION W/ INTRAOCULAR LENS  IMPLANT, BILATERAL Bilateral   . CORONARY ANGIOPLASTY WITH STENT PLACEMENT  12/20/2016   "1 stent"  . CORONARY STENT INTERVENTION N/A 12/20/2016   Procedure: CORONARY STENT INTERVENTION;  Surgeon: Martinique, Peter M, MD;  Location: [facility];  Service: Cardiovascular;  Laterality: N/A;  . FINGER SURGERY Left    "fell; developed skiers thumb; had to operate on it"  . INSERT / REPLACE / [place]   for syptomatic bradycardia and syncope -- in [facility]  . INTRAMEDULLARY (IM) NAIL INTERTROCHANTERIC Left 10/19/2018   Procedure: INTRAMEDULLARY (IM) NAIL  INTERTROCHANTRIC;  Surgeon: Altamese [place], MD;  Location: [facility];  Service: Orthopedics;  Laterality: Left;  . LEAD REVISION/REPAIR N/A 10/09/2016   New left subclavian MDT Adapta L PPM dual chamber system implanted by Dr [name] with previously placed R subclavian system abandoned  . PACEMAKER GENERATOR CHANGE  2001   pulse generator replacement by Dr. Rollene Fare   . PACEMAKER GENERATOR CHANGE  04/01/2008   PPM Medtronic -- model # ADDRL1 serial # [id number] -- pulse generator replacement by Dr. Verlon Setting   . RIGHT/LEFT HEART CATH AND CORONARY ANGIOGRAPHY N/A 11/27/2016   Procedure: RIGHT/LEFT HEART CATH AND CORONARY ANGIOGRAPHY;  Surgeon: Martinique, Peter M, MD;  Location: [facility];  Service: Cardiovascular;  Laterality: N/A;  . SQUAMOUS CELL CARCINOMA EXCISION    . TEE WITHOUT CARDIOVERSION N/A 01/29/2017   Procedure: TRANSESOPHAGEAL ECHOCARDIOGRAM (TEE);  Surgeon: Sherren Mocha, MD;  Location: [place] [place];  Service: Open Heart Surgery;  Laterality: N/A;  . TONSILLECTOMY    . TRANSCATHETER AORTIC VALVE REPLACEMENT, TRANSFEMORAL N/A 01/29/2017   Procedure: TRANSCATHETER AORTIC VALVE REPLACEMENT, TRANSFEMORAL;  Surgeon: Sherren Mocha, MD;  Location: [name];  Service: Open Heart Surgery;  Laterality: N/A;    ROS- all systems are reviewed and negative except as per HPI above  Current Outpatient Medications  Medication Sig Dispense Refill  . acetaminophen (TYLENOL) 325 MG tablet Take 2 tablets (650 mg total) by mouth every 6 (six) hours. 60 tablet 0  . alum & mag hydroxide-simeth (MAALOX/MYLANTA) 200-200-20 MG/5ML suspension Take  30 mLs by mouth every 4 (four) hours as needed for indigestion or heartburn. 355 mL 0  . amiodarone (PACERONE) 200 MG tablet TAKE 1/2 TABLET(100 MG) BY MOUTH DAILY 45 tablet 3  . amLODipine (NORVASC) 5 MG tablet TAKE 1 TABLET(5 MG) BY MOUTH DAILY 90 tablet 3  . calcium carbonate (OS-CAL) 600 MG TABS Take 600 mg by mouth 2 (two) times daily with a meal.    . ELIQUIS 2.5 MG  TABS tablet TAKE 1 TABLET(2.5 MG) BY MOUTH TWICE DAILY 180 tablet 1  . ezetimibe (ZETIA) 10 MG tablet TAKE 1 TABLET(10 MG) BY MOUTH DAILY 90 tablet 3  . feeding supplement, ENSURE ENLIVE, (ENSURE ENLIVE) LIQD Take 237 mLs by mouth 2 (two) times daily between meals. 237 mL 12  . fluticasone (FLONASE) 50 MCG/ACT nasal spray Place 2 sprays into both nostrils as needed. Sinus clog    . furosemide (LASIX) 20 MG tablet TAKE 1 TABLET BY MOUTH EVERY DAY; EXCEPT ON MONDAY AND FRIDAY 90 tablet 3  . loratadine (CLARITIN) 10 MG tablet Take 10 mg by mouth daily as needed for allergies.    Marland Kitchen losartan (COZAAR) 25 MG tablet Take 1 tablet (25 mg total) by mouth daily. 90 tablet 1  . metoprolol tartrate (LOPRESSOR) 25 MG tablet TAKE 1 AND 1/2 TABLETS(37.5 MG) BY MOUTH TWICE DAILY 270 tablet 2  . Multiple Vitamins-Minerals (PRESERVISION AREDS 2) CAPS Take 1 capsule by mouth 2 (two) times daily.    . nitroGLYCERIN (NITROSTAT) 0.4 MG SL tablet Place 1 tablet (0.4 mg total) under the tongue every 5 (five) minutes as needed for chest pain. 25 tablet 12  . pantoprazole (PROTONIX) 40 MG tablet Take 1 tablet (40 mg total) by mouth 2 (two) times daily before a meal. 180 tablet 3  . polyethylene glycol (MIRALAX / GLYCOLAX) 17 g packet Take 17 g by mouth 2 (two) times daily. 14 each 0  . polyvinyl alcohol (LIQUIFILM TEARS) 1.4 % ophthalmic solution Place 1-2 drops into both eyes 3 (three) times daily as needed for dry eyes.    . sodium chloride (OCEAN) 0.65 % SOLN nasal spray Place 1 spray into both nostrils as needed for congestion.    Marland Kitchen SYNTHROID 125 MCG tablet Take 125 mcg by mouth daily before breakfast.      No current facility-administered medications for this visit.    Physical Exam: Vitals:   08/10/20 1415  BP: 132/68  Pulse: 81  SpO2: 97%  Weight: 126 lb 6.4 oz (57.3 kg)  Height: 5\' 6"  (1.676 m)    GEN- The patient is well appearing, alert and oriented x 3 today.   Head- normocephalic, atraumatic Eyes-   Sclera clear, conjunctiva pink Ears- hearing intact Oropharynx- clear Lungs- Clear to ausculation bilaterally, normal work of breathing Chest- pacemaker pocket is well healed Heart- Regular rate and rhythm, no murmurs, rubs or gallops, PMI not laterally displaced GI- soft, NT, ND, + BS Extremities- no clubbing, cyanosis, or edema  Pacemaker interrogation- reviewed in detail today,  See PACEART report  ekg tracing ordered today is personally reviewed and shows atrial pacing, first degree AV block  Assessment and Plan:  1. Symptomatic sinus bradycardia  Normal pacemaker function See Pace Art report No changes today she is device dependant today (atrial)  2. persistent afib [place] 0.1% with los dose amiodarone Requires close follow-up to avoid toxicity with this medicine Continue eliquis  3. HTN Stable No change required today  4. CAD No ischemic symptoms  Risks, benefits  and potential toxicities for medications prescribed and/or refilled reviewed with patient today.   Return to see EP PA annually  Thompson Grayer MD, [facility] 08/10/2020 2:37 PM

## 2020-08-17 DIAGNOSIS — N6489 Other specified disorders of breast: Secondary | ICD-10-CM | POA: Diagnosis not present

## 2020-08-17 DIAGNOSIS — R928 Other abnormal and inconclusive findings on diagnostic imaging of breast: Secondary | ICD-10-CM | POA: Diagnosis not present

## 2020-08-18 ENCOUNTER — Encounter: Payer: Self-pay | Admitting: Cardiology

## 2020-08-18 NOTE — Telephone Encounter (Signed)
error 

## 2020-08-22 ENCOUNTER — Telehealth: Payer: Self-pay | Admitting: Cardiology

## 2020-08-22 DIAGNOSIS — C50212 Malignant neoplasm of upper-inner quadrant of left female breast: Secondary | ICD-10-CM | POA: Diagnosis not present

## 2020-08-22 DIAGNOSIS — C50812 Malignant neoplasm of overlapping sites of left female breast: Secondary | ICD-10-CM | POA: Diagnosis not present

## 2020-08-22 DIAGNOSIS — Z170 Estrogen receptor positive status [ER+]: Secondary | ICD-10-CM | POA: Diagnosis not present

## 2020-08-22 MED ORDER — NITROGLYCERIN 0.4 MG SL SUBL: 0.4000 mg | SUBLINGUAL_TABLET | SUBLINGUAL | 12 refills | Status: DC | PRN

## 2020-08-22 NOTE — Telephone Encounter (Signed)
*  STAT* If patient is at the pharmacy, call can be transferred to refill team.   1. Which medications need to be refilled? (please list name of each medication and dose if known) nitroGLYCERIN (NITROSTAT) 0.4 MG SL tablet  2. Which pharmacy/location (including street and city if local pharmacy) is medication to be sent to?[id number] - [place], [place] - [address]  3. Do they need a 30 day or 90 day supply? [place]

## 2020-08-24 ENCOUNTER — Encounter: Payer: Self-pay | Admitting: Internal Medicine

## 2020-08-25 ENCOUNTER — Telehealth: Payer: Self-pay | Admitting: Hematology and Oncology

## 2020-08-25 NOTE — Telephone Encounter (Signed)
Spoke to patient to confirm afternoon clinic appointment for 6/29, packet sent by [facility]

## 2020-08-26 ENCOUNTER — Encounter: Payer: Self-pay | Admitting: *Deleted

## 2020-08-26 DIAGNOSIS — C50412 Malignant neoplasm of upper-outer quadrant of left female breast: Secondary | ICD-10-CM | POA: Insufficient documentation

## 2020-08-26 DIAGNOSIS — Z170 Estrogen receptor positive status [ER+]: Secondary | ICD-10-CM | POA: Insufficient documentation

## 2020-08-30 ENCOUNTER — Telehealth: Payer: Self-pay | Admitting: Cardiology

## 2020-08-30 NOTE — Telephone Encounter (Signed)
Returned call to patient-advised unsure who called.  No recent results or upcoming appointments.    Advised would check with Dr. Doug Sou nurse to verify.

## 2020-08-30 NOTE — Telephone Encounter (Signed)
PT is returning a call 

## 2020-08-30 NOTE — Progress Notes (Signed)
[facility] NOTE  Patient Care Team: [name], MD as PCP - General (Internal Medicine) Martinique, Peter M, MD as PCP - Cardiology (Cardiology) Thompson Grayer, MD as PCP - Electrophysiology (Cardiology) Rockwell Germany, RN as Oncology Nurse Navigator Mauro Kaufmann, RN as Oncology Nurse Navigator Gery Pray, MD as Consulting Physician (Radiation Oncology) Nicholas Lose, MD as Consulting Physician (Hematology and Oncology) Jovita Kussmaul, MD as Consulting Physician (General Surgery)  CHIEF COMPLAINTS/PURPOSE OF CONSULTATION:  Newly diagnosed breast cancer  HISTORY OF PRESENTING ILLNESS:  [name] [name] 87 y.o. female is here because of recent diagnosis of left breast cancer. Screening mammogram on 08/08/2020 showed 0.7 cm irregular focal asymmetry in the left breast that was indeterminate. Diagnostic mammogram and [place] on 08/17/2020 showed 0.8 cm mass in left breast highly suggestive for malignancy. Biopsy revealed grade 1 invasive ductal carcinoma that was ER/PR positive HER2 negative with a Ki-67 of 5%.  She was presented this morning at the multidisciplinary tumor board and she is here today to discuss her treatment plan.  She presents to the clinic today for initial evaluation and discussion of treatment options.   I reviewed her records extensively and collaborated the history with the patient.  SUMMARY OF ONCOLOGIC HISTORY: Oncology History  Malignant neoplasm of upper-outer quadrant of left breast in female, estrogen receptor positive ([place])  08/26/2020 Initial Diagnosis   Screening detected left breast mass 0.8 cm, axilla negative, biopsy revealed grade 1 IDC ER 95%, PR 1%, HER2 equivocal by IHC, FISH negative, Ki-67 5%     MEDICAL HISTORY:  Past Medical History:  Diagnosis Date   CKD (chronic kidney disease)    Complication of anesthesia    Coronary artery disease    a. 12/20/2016: s/p DES to LAD    Diverticulosis    GERD (gastroesophageal  reflux disease)    Hemorrhoids    Hyperlipidemia    Hypertension    Hypothyroidism    Osteoarthritis    Paroxysmal atrial fibrillation ([place])    a. on [place] with Xarelto but switched to Eliquis after stenting.    PONV (postoperative nausea and vomiting)    Presence of permanent cardiac pacemaker    S/P TAVR (transcatheter aortic valve replacement) 01/29/2017   23 mm Edwards Sapien 3 transcatheter heart valve placed via percutaneous right transfemoral approach     SURGICAL HISTORY: Past Surgical History:  Procedure Laterality Date   ABDOMINAL HYSTERECTOMY  1980   BASAL CELL CARCINOMA EXCISION     CARDIAC CATHETERIZATION  ~ 11/2016   CATARACT EXTRACTION W/ INTRAOCULAR LENS  IMPLANT, BILATERAL Bilateral    CORONARY ANGIOPLASTY WITH STENT PLACEMENT  12/20/2016   "1 stent"   CORONARY STENT INTERVENTION N/A 12/20/2016   Procedure: CORONARY STENT INTERVENTION;  Surgeon: Martinique, Peter M, MD;  Location: [facility];  Service: Cardiovascular;  Laterality: N/A;   FINGER SURGERY Left    "fell; developed skiers thumb; had to operate on it"   INSERT / REPLACE / [place]   for syptomatic bradycardia and syncope -- in [place] ([place]) NAIL INTERTROCHANTERIC Left 10/19/2018   Procedure: INTRAMEDULLARY ([place]) NAIL INTERTROCHANTRIC;  Surgeon: Altamese [place], MD;  Location: [facility];  Service: Orthopedics;  Laterality: Left;   LEAD REVISION/REPAIR N/A 10/09/2016   New left subclavian MDT Adapta L PPM dual chamber system implanted by Dr [name] with previously placed R subclavian system abandoned   PACEMAKER GENERATOR CHANGE  2001   pulse generator replacement  by Dr. Rollene Fare    PACEMAKER GENERATOR CHANGE  04/01/2008   PPM Medtronic -- model # ADDRL1 serial # [id number] -- pulse generator replacement by Dr. Verlon Setting    RIGHT/LEFT [place] N/A 11/27/2016   Procedure: RIGHT/LEFT HEART CATH AND CORONARY ANGIOGRAPHY;  Surgeon: Martinique, Peter M, MD;   Location: [facility];  Service: Cardiovascular;  Laterality: N/A;   SQUAMOUS CELL CARCINOMA EXCISION     TEE WITHOUT CARDIOVERSION N/A 01/29/2017   Procedure: TRANSESOPHAGEAL ECHOCARDIOGRAM (TEE);  Surgeon: Sherren Mocha, MD;  Location: [facility];  Service: Open Heart Surgery;  Laterality: N/A;   TONSILLECTOMY     TRANSCATHETER AORTIC VALVE REPLACEMENT, TRANSFEMORAL N/A 01/29/2017   Procedure: TRANSCATHETER AORTIC VALVE REPLACEMENT, TRANSFEMORAL;  Surgeon: Sherren Mocha, MD;  Location: [facility];  Service: Open Heart Surgery;  Laterality: N/A;    SOCIAL HISTORY: Social History   Socioeconomic History   Marital status: Widowed    Spouse name: Not on file   Number of children: 0   Years of education: Not on file   Highest education level: Not on file  Occupational History    Employer: RETIRED  Tobacco Use   Smoking status: Former    Packs/day: 1.50    Years: 30.00    Pack years: 45.00    Types: Cigarettes    Quit date: 03/05/1978    Years since quitting: 42.5   Smokeless tobacco: Never  Vaping Use   Vaping Use: Never used  Substance and Sexual Activity   Alcohol use: No   Drug use: No   Sexual activity: Not on file  Other Topics Concern   Not on file  Social History Narrative   Not on file   Social Determinants of Health   Financial Resource Strain: Not on file  Food Insecurity: Not on file  Transportation Needs: Not on file  Physical Activity: Not on file  Stress: Not on file  Social Connections: Not on file  Intimate Partner Violence: Not on file    FAMILY HISTORY: Family History  Problem Relation Age of Onset   Cancer Father        lung cancer   Alzheimer's disease Mother     ALLERGIES:  is allergic to penicillins, tetanus toxoid, demerol, clindamycin/lincomycin, and codeine.  MEDICATIONS:  Current Outpatient Medications  Medication Sig Dispense Refill   acetaminophen (TYLENOL) 325 MG tablet Take 2 tablets (650 mg total) by mouth every 6 (six) hours.  60 tablet 0   amiodarone (PACERONE) 200 MG tablet TAKE 1/2 TABLET(100 MG) BY MOUTH DAILY 45 tablet 3   amLODipine (NORVASC) 5 MG tablet TAKE 1 TABLET(5 MG) BY MOUTH DAILY 90 tablet 3   calcium carbonate (OS-CAL) 600 MG TABS Take 600 mg by mouth 2 (two) times daily with a meal.     ELIQUIS 2.5 MG TABS tablet TAKE 1 TABLET(2.5 MG) BY MOUTH TWICE DAILY 180 tablet 1   ezetimibe (ZETIA) 10 MG tablet TAKE 1 TABLET(10 MG) BY MOUTH DAILY 90 tablet 3   feeding supplement, ENSURE ENLIVE, (ENSURE ENLIVE) LIQD Take 237 mLs by mouth 2 (two) times daily between meals. 237 mL 12   fluticasone (FLONASE) 50 MCG/ACT nasal spray Place 2 sprays into both nostrils as needed. Sinus clog     furosemide (LASIX) 20 MG tablet TAKE 1 TABLET BY MOUTH EVERY DAY; EXCEPT ON MONDAY AND FRIDAY 90 tablet 3   loratadine (CLARITIN) 10 MG tablet Take 10 mg by mouth daily as needed for allergies.  losartan (COZAAR) 25 MG tablet Take 1 tablet (25 mg total) by mouth daily. 90 tablet 1   metoprolol tartrate (LOPRESSOR) 25 MG tablet TAKE 1 AND 1/2 TABLETS(37.5 MG) BY MOUTH TWICE DAILY 270 tablet 2   Multiple Vitamins-Minerals (PRESERVISION AREDS 2) CAPS Take 1 capsule by mouth 2 (two) times daily.     nitroGLYCERIN (NITROSTAT) 0.4 MG SL tablet Place 1 tablet (0.4 mg total) under the tongue every 5 (five) minutes as needed for chest pain. 25 tablet 12   pantoprazole (PROTONIX) 40 MG tablet Take 1 tablet (40 mg total) by mouth 2 (two) times daily before a meal. 180 tablet 3   polyethylene glycol (MIRALAX / GLYCOLAX) 17 g packet Take 17 g by mouth 2 (two) times daily. 14 each 0   polyvinyl alcohol (LIQUIFILM TEARS) 1.4 % ophthalmic solution Place 1-2 drops into both eyes 3 (three) times daily as needed for dry eyes.     sodium chloride (OCEAN) 0.65 % SOLN nasal spray Place 1 spray into both nostrils as needed for congestion.     SYNTHROID 125 MCG tablet Take 125 mcg by mouth daily before breakfast.      No current facility-administered  medications for this visit.    REVIEW OF SYSTEMS:   Constitutional: Denies fevers, chills or abnormal night sweats   All other systems were reviewed with the patient and are negative.  PHYSICAL EXAMINATION: ECOG PERFORMANCE STATUS: 1 - Symptomatic but completely ambulatory  Vitals:   08/31/20 1322  BP: 131/64  Pulse: 69  Resp: 18  Temp: 97.9 F (36.6 C)  SpO2: 99%   Filed Weights   08/31/20 1322  Weight: 121 lb 12.8 oz (55.2 kg)    GENERAL:alert, no distress and comfortable   BREAST:No palpable nodules in breast. No palpable axillary or supraclavicular lymphadenopathy (exam performed in the presence of a chaperone)   LABORATORY DATA:  I have reviewed the data as listed Lab Results  Component Value Date   WBC 5.5 08/31/2020   HGB 13.2 08/31/2020   HCT 39.3 08/31/2020   MCV 104.0 (H) 08/31/2020   PLT 172 08/31/2020   Lab Results  Component Value Date   NA 139 08/31/2020   K 4.6 08/31/2020   CL 100 08/31/2020   CO2 28 08/31/2020    RADIOGRAPHIC STUDIES: I have personally reviewed the radiological reports and agreed with the findings in the report.  ASSESSMENT AND PLAN:  Malignant neoplasm of upper-outer quadrant of left breast in female, estrogen receptor positive ([place]) June 2022:Screening detected left breast mass 0.8 cm, axilla negative, biopsy revealed grade 1 IDC ER 95%, PR 1%, HER2 equivocal by IHC, FISH negative, Ki-67 5%  Pathology and radiology counseling: Discussed with the patient, the details of pathology including the type of breast cancer,the clinical staging, the significance of ER, PR and HER-2/neu receptors and the implications for treatment. After reviewing the pathology in detail, we proceeded to discuss the different treatment options between surgery, radiation,  antiestrogen therapies.  Treatment plan: 1.  Breast conserving surgery 2. +/- XRT (most likely no radiation) 3.  Adjuvant antiestrogen therapy Genetics consult  Return to clinic  after surgery to discuss final pathology report.   All questions were answered. The patient knows to call the clinic with any problems, questions or concerns.   Rulon Eisenmenger, MD, MPH 08/31/2020   I, Reinaldo Raddle, am acting as scribe for Dr. Nicholas Lose, MD. I have reviewed the above documentation for accuracy and completeness, and I agree with  the above.

## 2020-08-31 ENCOUNTER — Other Ambulatory Visit: Payer: Self-pay

## 2020-08-31 ENCOUNTER — Encounter: Payer: Self-pay | Admitting: *Deleted

## 2020-08-31 ENCOUNTER — Ambulatory Visit (HOSPITAL_BASED_OUTPATIENT_CLINIC_OR_DEPARTMENT_OTHER): Payer: Medicare PPO | Admitting: Genetic Counselor

## 2020-08-31 ENCOUNTER — Ambulatory Visit: Payer: Self-pay | Admitting: General Surgery

## 2020-08-31 ENCOUNTER — Inpatient Hospital Stay: Payer: Medicare PPO | Attending: Hematology and Oncology | Admitting: Hematology and Oncology

## 2020-08-31 ENCOUNTER — Ambulatory Visit
Admission: RE | Admit: 2020-08-31 | Discharge: 2020-08-31 | Disposition: A | Discharge status: Home / Self Care | Payer: Medicare PPO | Source: Ambulatory Visit | Attending: Radiation Oncology | Admitting: Radiation Oncology

## 2020-08-31 ENCOUNTER — Inpatient Hospital Stay: Payer: Medicare PPO

## 2020-08-31 ENCOUNTER — Ambulatory Visit: Payer: Medicare PPO | Attending: General Surgery | Admitting: Physical Therapy

## 2020-08-31 DIAGNOSIS — Z170 Estrogen receptor positive status [ER+]: Secondary | ICD-10-CM

## 2020-08-31 DIAGNOSIS — C50412 Malignant neoplasm of upper-outer quadrant of left female breast: Secondary | ICD-10-CM

## 2020-08-31 DIAGNOSIS — Z87891 Personal history of nicotine dependence: Secondary | ICD-10-CM | POA: Insufficient documentation

## 2020-08-31 DIAGNOSIS — Z85828 Personal history of other malignant neoplasm of skin: Secondary | ICD-10-CM | POA: Diagnosis not present

## 2020-08-31 DIAGNOSIS — C50212 Malignant neoplasm of upper-inner quadrant of left female breast: Secondary | ICD-10-CM | POA: Diagnosis not present

## 2020-08-31 DIAGNOSIS — N189 Chronic kidney disease, unspecified: Secondary | ICD-10-CM | POA: Diagnosis not present

## 2020-08-31 DIAGNOSIS — I129 Hypertensive chronic kidney disease with stage 1 through stage 4 chronic kidney disease, or unspecified chronic kidney disease: Secondary | ICD-10-CM | POA: Insufficient documentation

## 2020-08-31 DIAGNOSIS — Z803 Family history of malignant neoplasm of breast: Secondary | ICD-10-CM

## 2020-08-31 LAB — CMP (CANCER CENTER ONLY)
ALT: 12 U/L (ref 0–44)
AST: 23 U/L (ref 15–41)
Albumin: 3.7 g/dL (ref 3.5–5.0)
Alkaline Phosphatase: 66 U/L (ref 38–126)
Anion gap: 11 (ref 5–15)
BUN: 39 mg/dL — ABNORMAL HIGH (ref 8–23)
CO2: 28 mmol/L (ref 22–32)
Calcium: 9.8 mg/dL (ref 8.9–10.3)
Chloride: 100 mmol/L (ref 98–111)
Creatinine: 1.27 mg/dL — ABNORMAL HIGH (ref 0.44–1.00)
GFR, Estimated: 40 mL/min — ABNORMAL LOW (ref >60)
Glucose, Bld: 96 mg/dL (ref 70–99)
Potassium: 4.6 mmol/L (ref 3.5–5.1)
Sodium: 139 mmol/L (ref 135–145)
Total Bilirubin: 1.2 mg/dL (ref 0.3–1.2)
Total Protein: 6.5 g/dL (ref 6.5–8.1)

## 2020-08-31 LAB — CBC WITH DIFFERENTIAL (CANCER CENTER ONLY)
Abs Immature Granulocytes: 0.02 10*3/uL (ref 0.00–0.07)
Basophils Absolute: 0 10*3/uL (ref 0.0–0.1)
Basophils Relative: 1 %
Eosinophils Absolute: 0 10*3/uL (ref 0.0–0.5)
Eosinophils Relative: 1 %
HCT: 39.3 % (ref 36.0–46.0)
Hemoglobin: 13.2 g/dL (ref 12.0–15.0)
Immature Granulocytes: 0 %
Lymphocytes Relative: 15 %
Lymphs Abs: 0.8 10*3/uL (ref 0.7–4.0)
MCH: 34.9 pg — ABNORMAL HIGH (ref 26.0–34.0)
MCHC: 33.6 g/dL (ref 30.0–36.0)
MCV: 104 fL — ABNORMAL HIGH (ref 80.0–100.0)
Monocytes Absolute: 0.4 10*3/uL (ref 0.1–1.0)
Monocytes Relative: 8 %
Neutro Abs: 4.2 10*3/uL (ref 1.7–7.7)
Neutrophils Relative %: 75 %
Platelet Count: 172 10*3/uL (ref 150–400)
RBC: 3.78 MIL/uL — ABNORMAL LOW (ref 3.87–5.11)
RDW: 13.9 % (ref 11.5–15.5)
WBC Count: 5.5 10*3/uL (ref 4.0–10.5)
nRBC: 0 % (ref 0.0–0.2)

## 2020-08-31 LAB — GENETIC SCREENING ORDER

## 2020-08-31 NOTE — Progress Notes (Signed)
REFERRING PROVIDER: Nicholas Lose, MD [address] [place],  [place] [address]  PRIMARY PROVIDER:  [name], MD  PRIMARY REASON FOR VISIT:  Encounter Diagnoses  Name Primary?   Malignant neoplasm of upper-outer quadrant of left breast in female, estrogen receptor positive ([place]) Yes   Family history of breast cancer    Personal history of skin cancer     HISTORY OF PRESENT ILLNESS:   [name] [name], a 87 y.o. female, was seen for a [facility] cancer genetics consultation at the request of Dr. Lindi Adie due to a personal and family history of cancer.  [name] [name] presents to clinic today to discuss the possibility of a hereditary predisposition to cancer, to discuss genetic testing, and to further clarify her future cancer risks, as well as potential cancer risks for family members.   In June 2022, at the age of 61, [name] [name] was diagnosed with invasive ductal carcinoma of the left breast (ER+/PR+/HER2-). The preliminary treatment plan includes left lumpectomy and anti-estrogens.  She also has a history of multiple skin cancers (non-melanoma), diagnosed after the age of 67, on her leg, arm, and face.     CANCER HISTORY:  Oncology History  Malignant neoplasm of upper-outer quadrant of left breast in female, estrogen receptor positive ([place])  08/26/2020 Initial Diagnosis   Screening detected left breast mass 0.8 cm, axilla negative, biopsy revealed grade 1 IDC ER 95%, PR 1%, HER2 equivocal by IHC, FISH negative, Ki-67 5%   08/31/2020 Cancer Staging   Staging form: Breast, AJCC 8th Edition - Clinical stage from 08/31/2020: Stage IA (cT1b, cN0, cM0, G1, ER+, PR+, HER2-) - Signed by Nicholas Lose, MD on 08/31/2020  Stage prefix: Initial diagnosis  Histologic grading system: 3 grade system      RISK FACTORS:  Menarche was at age 70.  Nulliparous.  OCP use for approximately 0 years.  Ovaries intact: no.  Hysterectomy: yes.  HRT use: yes; unknown number of  years Colonoscopy: yes;  two polyps detected in 2018 . Mammogram within the last year: yes.   Past Medical History:  Diagnosis Date   CKD (chronic kidney disease)    Complication of anesthesia    Coronary artery disease    a. 12/20/2016: s/p DES to LAD    Diverticulosis    GERD (gastroesophageal reflux disease)    Hemorrhoids    Hyperlipidemia    Hypertension    Hypothyroidism    Osteoarthritis    Paroxysmal atrial fibrillation ([facility])    a. on [place] with Xarelto but switched to Eliquis after stenting.    PONV (postoperative nausea and vomiting)    Presence of permanent cardiac pacemaker    S/P TAVR (transcatheter aortic valve replacement) 01/29/2017   23 mm Edwards Sapien 3 transcatheter heart valve placed via percutaneous right transfemoral approach     Past Surgical History:  Procedure Laterality Date   ABDOMINAL HYSTERECTOMY  1980   BASAL CELL CARCINOMA EXCISION     CARDIAC CATHETERIZATION  ~ 11/2016   CATARACT EXTRACTION W/ INTRAOCULAR LENS  IMPLANT, BILATERAL Bilateral    CORONARY ANGIOPLASTY WITH STENT PLACEMENT  12/20/2016   "1 stent"   CORONARY STENT INTERVENTION N/A 12/20/2016   Procedure: CORONARY STENT INTERVENTION;  Surgeon: Martinique, Peter M, MD;  Location: [facility];  Service: Cardiovascular;  Laterality: N/A;   FINGER SURGERY Left    "fell; developed skiers thumb; had to operate on it"   INSERT / REPLACE / REMOVE PACEMAKER  1993   for syptomatic  bradycardia and syncope -- in [place] ([place]) NAIL INTERTROCHANTERIC Left 10/19/2018   Procedure: INTRAMEDULLARY ([place]) NAIL INTERTROCHANTRIC;  Surgeon: Altamese [place], MD;  Location: [facility];  Service: Orthopedics;  Laterality: Left;   LEAD REVISION/REPAIR N/A 10/09/2016   New left subclavian MDT Adapta L PPM dual chamber system implanted by Dr [name] with previously placed R subclavian system abandoned   PACEMAKER GENERATOR CHANGE  2001   pulse generator replacement by Dr. Pincus Large  GENERATOR CHANGE  04/01/2008   PPM Medtronic -- model # ADDRL1 serial # [id number] -- pulse generator replacement by Dr. Verlon Setting    RIGHT/LEFT [place] N/A 11/27/2016   Procedure: RIGHT/LEFT HEART CATH AND CORONARY ANGIOGRAPHY;  Surgeon: Martinique, Peter M, MD;  Location: [facility];  Service: Cardiovascular;  Laterality: N/A;   SQUAMOUS CELL CARCINOMA EXCISION     TEE WITHOUT CARDIOVERSION N/A 01/29/2017   Procedure: TRANSESOPHAGEAL ECHOCARDIOGRAM (TEE);  Surgeon: Sherren Mocha, MD;  Location: [facility];  Service: Open Heart Surgery;  Laterality: N/A;   TONSILLECTOMY     TRANSCATHETER AORTIC VALVE REPLACEMENT, TRANSFEMORAL N/A 01/29/2017   Procedure: TRANSCATHETER AORTIC VALVE REPLACEMENT, TRANSFEMORAL;  Surgeon: Sherren Mocha, MD;  Location: [facility];  Service: Open Heart Surgery;  Laterality: N/A;    Social History   Socioeconomic History   Marital status: Widowed    Spouse name: Not on file   Number of children: 0   Years of education: Not on file   Highest education level: Not on file  Occupational History    Employer: RETIRED  Tobacco Use   Smoking status: Former    Packs/day: 1.50    Years: 30.00    Pack years: 45.00    Types: Cigarettes    Quit date: 03/05/1978    Years since quitting: 42.5   Smokeless tobacco: Never  Vaping Use   Vaping Use: Never used  Substance and Sexual Activity   Alcohol use: No   Drug use: No   Sexual activity: Not on file  Other Topics Concern   Not on file  Social History Narrative   Not on file   Social Determinants of Health   Financial Resource Strain: Not on file  Food Insecurity: Not on file  Transportation Needs: Not on file  Physical Activity: Not on file  Stress: Not on file  Social Connections: Not on file     FAMILY HISTORY:  We obtained a detailed, 4-generation family history.  Significant diagnoses are listed below: Family History  Problem Relation Age of Onset   Alzheimer's disease Mother     Kidney cancer Father        dx 36s   Breast cancer Maternal Aunt 14   Breast cancer Cousin        maternal cousin, dx >50    [name] [name] is unaware of previous family history of genetic testing for hereditary cancer risks. There is no reported Ashkenazi Jewish ancestry.   GENETIC COUNSELING ASSESSMENT: [name] [name] is a 87 y.o. female with a personal and family history of cancer which is somewhat suggestive of a hereditary cancer syndrome and predisposition to cancer given the presence of related cancers in her maternal family. We, therefore, discussed and recommended the following at today's visit.   DISCUSSION: We discussed that 5 - 10% of cancer is hereditary, with most cases of hereditary breast cancer associated with mutations in BRCA1/2.  There are other genes that can be associated with hereditary  breast cancer syndromes.  We discussed that testing is beneficial for several reasons including knowing how to follow individuals for their cancer risks and understanding if other family members could be at risk for cancer and allowing them to undergo genetic testing.   We reviewed the characteristics, features and inheritance patterns of hereditary cancer syndromes. We also discussed genetic testing, including the appropriate family members to test, the process of testing, insurance coverage and turn-around-time for results. We discussed the implications of a negative, positive, carrier and/or variant of uncertain significant result. We recommended [name] [name] pursue genetic testing for a panel that includes genes associated with breast cancer.   The CustomNext-Cancer+RNAinsight panel offered by [facility] includes sequencing and rearrangement analysis for the following 47 genes:  APC, ATM, AXIN2, BARD1, BMPR1A, BRCA1, BRCA2, BRIP1, CDH1, CDK4, CDKN2A, CHEK2, DICER1, EPCAM, GREM1, HOXB13, MEN1, MLH1, MSH2, MSH3, MSH6, MUTYH, NBN, NF1, NF2, NTHL1, PALB2, PMS2, POLD1, POLE, PTEN, RAD51C, RAD51D,  RECQL, RET, SDHA, SDHAF2, SDHB, SDHC, SDHD, SMAD4, SMARCA4, STK11, TP53, TSC1, TSC2, and VHL.  RNA data is routinely analyzed for use in variant interpretation for all genes.  Based on [name] [name]'s personal and family history of cancer, she meets medical criteria for genetic testing. Despite that she meets criteria, she may still have an out of pocket cost. We discussed that if her out of pocket cost for testing is over $100, the laboratory will call to discuss self-pay options and/or patient pay assistance programs.   PLAN: After considering the risks, benefits, and limitations, [name] [name] provided informed consent to pursue genetic testing and the blood sample was sent to [facility] for analysis of the CustomNext-Cancer +RNAinsight Panel. Results should be available within approximately 3 weeks' time, at which point they will be disclosed by telephone to [name] [name], as will any additional recommendations warranted by these results. [name] [name] will receive a summary of her genetic counseling visit and a copy of her results once available. This information will also be available in Epic.   Lastly, we encouraged [name] [name] to remain in contact with cancer genetics annually so that we can continuously update the family history and inform her of any changes in cancer genetics and testing that may be of benefit for this family.   [name] [name]'s questions were answered to her satisfaction today. Our contact information was provided should additional questions or concerns arise. Thank you for the referral and allowing us to share in the care of your patient.   Cari M. Koerner, MS, LCGC Genetic Counselor [email or web address] (P) [phone]  The patient was seen for a total of 20 minutes in face-to-face genetic counseling.  The patient brought her sister and her caregiver to the appointment.  Drs. Magrinat, Gudena and/or Feng were available to discuss this case as needed.     _______________________________________________________________________ For Office Staff:  Number of people involved in session: 3 Was an Intern/ student involved with case: no  

## 2020-08-31 NOTE — Assessment & Plan Note (Signed)
June 2022:Screening detected left breast mass 0.8 cm, axilla negative, biopsy revealed grade 1 IDC ER 95%, PR 1%, HER2 equivocal by IHC, FISH negative, Ki-67 5%  Pathology and radiology counseling: Discussed with the patient, the details of pathology including the type of breast cancer,the clinical staging, the significance of ER, PR and HER-2/neu receptors and the implications for treatment. After reviewing the pathology in detail, we proceeded to discuss the different treatment options between surgery, radiation,  antiestrogen therapies.  Treatment plan: 1.  Breast conserving surgery 2. +/- XRT (most likely no radiation) 3.  Adjuvant antiestrogen therapy Genetics consult  Return to clinic after surgery to discuss final pathology report.

## 2020-08-31 NOTE — Progress Notes (Signed)
Radiation Oncology         [phone] ________________________________  Multidisciplinary Breast Oncology Clinic [facility]) Initial Outpatient Consultation  Name: [name] [name] MRN: [id number]  Date: 08/31/2020  DOB: Nov 04, 1929  IH:[id number], Jenny Reichmann, MD  Jovita Kussmaul, MD   REFERRING PHYSICIAN: Autumn Messing III, MD  DIAGNOSIS: The encounter diagnosis was Malignant neoplasm of upper-outer quadrant of left breast in female, estrogen receptor positive ([place]).  Stage 1 T1b, Nx  Left Breast UIQ, Invasive Ductal Carcinoma, ER+ / PR+ / Her2-, Grade 1     ICD-10-CM   1. Malignant neoplasm of upper-outer quadrant of left breast in female, estrogen receptor positive ([place])  C50.412    Z17.0       HISTORY OF PRESENT ILLNESS::[name] [name] is a 87 y.o. female who is presenting to the office today for evaluation of her newly diagnosed breast cancer. She is accompanied by her sister and caregiver. She is doing well overall.   She had routine screening mammography on 08/08/20 showing a 0.7 cm irregular focal asymmetry in the left breast. She underwent bilateral diagnostic mammography with tomography and left breast ultrasonography at [facility] on 08/17/20 showing: the 0.8 cm mass in the left breast to be highly suggestive of malignancy.  Biopsy on 08/22/20 showed: left breast invasive ductal carcinoma measuring 4 mm at the greatest linear extent, grade 1. Prognostic indicators significant for: estrogen receptor, 95% positive with strong staining intensity and progesterone receptor, 1% positive with moderate staining intensity. Proliferation marker Ki67 at 5%. HER2 negative.  Menarche: 87 years old LMP: unknown Contraceptive: none HRT: yes in 1980 (after hysterectomy)  The patient was referred today for presentation in the multidisciplinary conference.  Radiology studies and pathology slides were presented there for review and discussion of treatment options. A consensus was discussed regarding potential next  steps.  PREVIOUS RADIATION THERAPY: No  PAST MEDICAL HISTORY:  Past Medical History:  Diagnosis Date   CKD (chronic kidney disease)    Complication of anesthesia    Coronary artery disease    a. 12/20/2016: s/p DES to LAD    Diverticulosis    GERD (gastroesophageal reflux disease)    Hemorrhoids    Hyperlipidemia    Hypertension    Hypothyroidism    Osteoarthritis    Paroxysmal atrial fibrillation (HCC)    a. on [place] with Xarelto but switched to Eliquis after stenting.    PONV (postoperative nausea and vomiting)    Presence of permanent cardiac pacemaker    S/P TAVR (transcatheter aortic valve replacement) 01/29/2017   23 mm Edwards Sapien 3 transcatheter heart valve placed via percutaneous right transfemoral approach     PAST SURGICAL HISTORY: Past Surgical History:  Procedure Laterality Date   ABDOMINAL HYSTERECTOMY  1980   BASAL CELL CARCINOMA EXCISION     CARDIAC CATHETERIZATION  ~ 11/2016   CATARACT EXTRACTION W/ INTRAOCULAR LENS  IMPLANT, BILATERAL Bilateral    CORONARY ANGIOPLASTY WITH STENT PLACEMENT  12/20/2016   "1 stent"   CORONARY STENT INTERVENTION N/A 12/20/2016   Procedure: CORONARY STENT INTERVENTION;  Surgeon: Martinique, Peter M, MD;  Location: [place] [facility];  Service: Cardiovascular;  Laterality: N/A;   FINGER SURGERY Left    "fell; developed skiers thumb; had to operate on it"   INSERT / REPLACE / [place]   for syptomatic bradycardia and syncope -- in [place] (IM) NAIL INTERTROCHANTERIC Left 10/19/2018   Procedure: INTRAMEDULLARY (IM) NAIL INTERTROCHANTRIC;  Surgeon: Altamese Lineville, MD;  Location: [place];  Service: Orthopedics;  Laterality: Left;   LEAD REVISION/REPAIR N/A 10/09/2016   New left subclavian MDT Adapta L PPM dual chamber system implanted by Dr [name] with previously placed R subclavian system abandoned   PACEMAKER GENERATOR CHANGE  2001   pulse generator replacement by Dr. Pincus Large  GENERATOR CHANGE  04/01/2008   PPM Medtronic -- model # [id number] serial # [id number] -- pulse generator replacement by Dr. Verlon Setting    RIGHT/LEFT Ricardo N/A 11/27/2016   Procedure: RIGHT/LEFT HEART CATH AND CORONARY ANGIOGRAPHY;  Surgeon: Martinique, Peter M, MD;  Location: [place] [facility];  Service: Cardiovascular;  Laterality: N/A;   SQUAMOUS CELL CARCINOMA EXCISION     TEE WITHOUT CARDIOVERSION N/A 01/29/2017   Procedure: TRANSESOPHAGEAL ECHOCARDIOGRAM (TEE);  Surgeon: Sherren Mocha, MD;  Location: [place];  Service: Open Heart Surgery;  Laterality: N/A;   TONSILLECTOMY     TRANSCATHETER AORTIC VALVE REPLACEMENT, TRANSFEMORAL N/A 01/29/2017   Procedure: TRANSCATHETER AORTIC VALVE REPLACEMENT, TRANSFEMORAL;  Surgeon: Sherren Mocha, MD;  Location: [place];  Service: Open Heart Surgery;  Laterality: N/A;    FAMILY HISTORY:  Family History  Problem Relation Age of Onset   Alzheimer's disease Mother    Cancer Father        lung cancer   Thyroid cancer Maternal Aunt     SOCIAL HISTORY:  Social History   Socioeconomic History   Marital status: Widowed    Spouse name: Not on file   Number of children: 0   Years of education: Not on file   Highest education level: Not on file  Occupational History    Employer: RETIRED  Tobacco Use   Smoking status: Former    Packs/day: 1.50    Years: 30.00    Pack years: 45.00    Types: Cigarettes    Quit date: 03/05/1978    Years since quitting: 42.5   Smokeless tobacco: Never  Vaping Use   Vaping Use: Never used  Substance and Sexual Activity   Alcohol use: No   Drug use: No   Sexual activity: Not on file  Other Topics Concern   Not on file  Social History Narrative   Not on file   Social Determinants of Health   Financial Resource Strain: Not on file  Food Insecurity: Not on file  Transportation Needs: Not on file  Physical Activity: Not on file  Stress: Not on file  Social Connections: Not on file     ALLERGIES:  Allergies  Allergen Reactions   Penicillins Anaphylaxis and Other (See Comments)    Has patient had a PCN reaction causing immediate rash, facial/tongue/throat swelling, SOB or lightheadedness with hypotension: Yes Has patient had a PCN reaction causing severe rash involving mucus membranes or skin necrosis: No Has patient had a PCN reaction that required hospitalization: No Has patient had a PCN reaction occurring within the last 10 years: No If all of the above answers are "NO", then may proceed with Cephalosporin use.    Tetanus Toxoid Anaphylaxis   Demerol Other (See Comments)    Severe nausea   Clindamycin/Lincomycin    Codeine Nausea Only and Other (See Comments)    Severe nausea    MEDICATIONS:  Current Outpatient Medications  Medication Sig Dispense Refill   acetaminophen (TYLENOL) 325 MG tablet Take 2 tablets (650 mg total) by mouth every 6 (six) hours. 60 tablet 0   amiodarone (PACERONE) 200 MG tablet TAKE 1/2 TABLET(100  MG) BY MOUTH DAILY 45 tablet 3   amLODipine (NORVASC) 5 MG tablet TAKE 1 TABLET(5 MG) BY MOUTH DAILY 90 tablet 3   calcium carbonate (OS-CAL) 600 MG TABS Take 600 mg by mouth 2 (two) times daily with a meal.     ELIQUIS 2.5 MG TABS tablet TAKE 1 TABLET(2.5 MG) BY MOUTH TWICE DAILY 180 tablet 1   ezetimibe (ZETIA) 10 MG tablet TAKE 1 TABLET(10 MG) BY MOUTH DAILY 90 tablet 3   feeding supplement, ENSURE ENLIVE, (ENSURE ENLIVE) LIQD Take 237 mLs by mouth 2 (two) times daily between meals. 237 mL 12   fluticasone (FLONASE) 50 MCG/ACT nasal spray Place 2 sprays into both nostrils as needed. Sinus clog     furosemide (LASIX) 20 MG tablet TAKE 1 TABLET BY MOUTH EVERY DAY; EXCEPT ON MONDAY AND FRIDAY 90 tablet 3   loratadine (CLARITIN) 10 MG tablet Take 10 mg by mouth daily as needed for allergies.     losartan (COZAAR) 25 MG tablet Take 1 tablet (25 mg total) by mouth daily. 90 tablet 1   metoprolol tartrate (LOPRESSOR) 25 MG tablet TAKE 1 AND 1/2  TABLETS(37.5 MG) BY MOUTH TWICE DAILY 270 tablet 2   Multiple Vitamins-Minerals (PRESERVISION AREDS 2) CAPS Take 1 capsule by mouth 2 (two) times daily.     nitroGLYCERIN (NITROSTAT) 0.4 MG SL tablet Place 1 tablet (0.4 mg total) under the tongue every 5 (five) minutes as needed for chest pain. 25 tablet 12   pantoprazole (PROTONIX) 40 MG tablet Take 1 tablet (40 mg total) by mouth 2 (two) times daily before a meal. 180 tablet 3   polyethylene glycol (MIRALAX / GLYCOLAX) 17 g packet Take 17 g by mouth 2 (two) times daily. 14 each 0   polyvinyl alcohol (LIQUIFILM TEARS) 1.4 % ophthalmic solution Place 1-2 drops into both eyes 3 (three) times daily as needed for dry eyes.     sodium chloride (OCEAN) 0.65 % SOLN nasal spray Place 1 spray into both nostrils as needed for congestion.     SYNTHROID 125 MCG tablet Take 125 mcg by mouth daily before breakfast.      No current facility-administered medications for this encounter.    REVIEW OF SYSTEMS: A 10+ POINT REVIEW OF SYSTEMS WAS OBTAINED including neurology, dermatology, psychiatry, cardiac, respiratory, lymph, extremities, GI, GU, musculoskeletal, constitutional, reproductive, HEENT. On the provided form, she reports mild fatigue, arthritic pain in knee cap, wearing glasses, changes in vision, runny nose, sinus problems, wearing dentures, AFIB, occasional swelling of the feet, experiences some incontinence, hematoma at breast biopsy site, history of skin cancer, back pain, some difficulty walking, arthritis, feelings of forgetfulness, thyroid problems, and has received a blood transfusion once in the past. She denies any other symptoms.    PHYSICAL EXAM:   Vitals with BMI 08/31/2020  Height _0   Weight 121 lbs 13 oz  BMI 50.27  Systolic 741  Diastolic 64  Pulse 69    Lungs are clear to auscultation bilaterally. Heart has regular rate and rhythm. No palpable cervical, supraclavicular, or axillary adenopathy. Abdomen soft, non-tender, normal  bowel sounds. Breast: Right breast with non functioning pace-maker in right upper chest, and no palpable mass, nipple discharge, or bleeding. Left breast with extensive bruising from biopsy, the patient has a functioning pace maker in place in the left upper chest. When laying flat, the patient has a 4 x 4 cm hematoma in the upper central aspect of the left breast that is more noticeable.[place] Kitchen  KPS = 80  100 - Normal; no complaints; no evidence of disease. 90   - Able to carry on normal activity; minor signs or symptoms of disease. 80   - Normal activity with effort; some signs or symptoms of disease. 79   - Cares for self; unable to carry on normal activity or to do active work. 60   - Requires occasional assistance, but is able to care for most of his personal needs. 50   - Requires considerable assistance and frequent medical care. 71   - Disabled; requires special care and assistance. 27   - Severely disabled; hospital admission is indicated although death not imminent. 38   - Very sick; hospital admission necessary; active supportive treatment necessary. 10   - Moribund; fatal processes progressing rapidly. 0     - Dead  Karnofsky DA, Abelmann [place], Craver LS and Burchenal JH [phone]) The use of the nitrogen mustards in the palliative treatment of carcinoma: with particular reference to bronchogenic carcinoma Cancer 1 634-56  LABORATORY DATA:  Lab Results  Component Value Date   WBC 5.5 08/31/2020   HGB 13.2 08/31/2020   HCT 39.3 08/31/2020   MCV 104.0 (H) 08/31/2020   PLT 172 08/31/2020   Lab Results  Component Value Date   NA 139 08/31/2020   K 4.6 08/31/2020   CL 100 08/31/2020   CO2 28 08/31/2020   Lab Results  Component Value Date   ALT 12 08/31/2020   AST 23 08/31/2020   ALKPHOS 66 08/31/2020   BILITOT 1.2 08/31/2020    PULMONARY FUNCTION TEST:   Recent Review Flowsheet Data   There is no flowsheet data to display.     RADIOGRAPHY: No results found.     IMPRESSION: Stage 1 T1b, Nx  Left Breast UIQ, Invasive Ductal Carcinoma, ER+ / PR+ / Her2-, Grade 1   Patient will be a good candidate for breast conservation with radiotherapy to left breast. We discussed the general course of radiation, potential side effects, and toxicities with radiation and the patient is interested in this approach.   Patient does have a pacemaker on the left side, potentially would have to have it moved if she is to receive radiation therapy because it would be in close proximity to her radiation field. The patients prognosis is excellent and assuming that she does hormonal therapy, her risk for recurrence is low even without radiation therapy.     PLAN:  Genetics  Left lumpectomy  +/- adjuvant radiation therapy (radiation doubtful in light of the patients pacemaker in the left upper chest and age) Aromatase Inhibitor     ------------------------------------------------  Blair Promise, PhD, MD  This document serves as a record of services personally performed by Gery Pray, MD. It was created on his behalf by Roney Mans, a trained medical scribe. The creation of this record is based on the scribe's personal observations and the provider's statements to them. This document has been checked and approved by the attending provider.

## 2020-08-31 NOTE — Addendum Note (Signed)
Addended by: Nicholas Lose on: 08/31/2020 02:11 PM   Modules accepted: Level of Service

## 2020-09-01 ENCOUNTER — Encounter: Payer: Self-pay | Admitting: Genetic Counselor

## 2020-09-01 DIAGNOSIS — Z803 Family history of malignant neoplasm of breast: Secondary | ICD-10-CM

## 2020-09-01 DIAGNOSIS — Z85828 Personal history of other malignant neoplasm of skin: Secondary | ICD-10-CM

## 2020-09-01 HISTORY — DX: Family history of malignant neoplasm of breast: Z80.3

## 2020-09-01 HISTORY — DX: Personal history of other malignant neoplasm of skin: Z85.828

## 2020-09-02 ENCOUNTER — Telehealth: Payer: Self-pay | Admitting: *Deleted

## 2020-09-02 ENCOUNTER — Encounter: Payer: Self-pay | Admitting: General Practice

## 2020-09-02 ENCOUNTER — Encounter: Payer: Self-pay | Admitting: *Deleted

## 2020-09-02 NOTE — Telephone Encounter (Signed)
Spoke to patient she wanted [name] to know recent mammogram revealed small cancer in  left breast.Dr.Toth advised a lumpectomy.He will be sending a clearance.Advised I will make [name] aware.

## 2020-09-02 NOTE — Telephone Encounter (Signed)
Attempted to call patient to follow up from [facility] 6/29. There was no answer and no voicemail to leave a message.

## 2020-09-02 NOTE — Progress Notes (Signed)
[place] Psychosocial Distress Screening Spiritual Care  Met with [name] by phone following Breast Multidisciplinary Clinic to introduce [place] team/resources, reviewing distress screen per protocol.  The patient scored a 4 on the Psychosocial Distress Thermometer which indicates moderate distress. Also assessed for distress and other psychosocial needs.   ONCBCN DISTRESS SCREENING 09/02/2020  Screening Type Initial Screening  Distress experienced in past week (1-10) 4  Emotional problem type Nervousness/Anxiety;Adjusting to illness  Physical Problem type Pain;Sleep/insomnia;Skin dry/itchy  Referral to support programs Yes   [name] [name] reports good support from "family, friends, and a marvelous caregiver." She is using faith and perspective to cope, stating that she feels "very, very, very blessed" about the early stage of her diagnosis. She reports strong confidence in her team, noting that she left [facility] feeling "calmness, peace, and gratitude."  Follow up needed: No. [name] [name] is aware of ongoing [place] support team and programming availability, and plans to reach out to chaplain as needed/desired.   [place], [place], [facility] Pager [phone] Voicemail [phone]

## 2020-09-06 ENCOUNTER — Ambulatory Visit (INDEPENDENT_AMBULATORY_CARE_PROVIDER_SITE_OTHER): Payer: Medicare PPO

## 2020-09-06 ENCOUNTER — Telehealth: Payer: Self-pay | Admitting: *Deleted

## 2020-09-06 DIAGNOSIS — I495 Sick sinus syndrome: Secondary | ICD-10-CM

## 2020-09-06 LAB — CUP PACEART REMOTE DEVICE CHECK
Battery Impedance: 234 Ohm
Battery Remaining Longevity: 107 mo
Battery Voltage: 2.77 V
Brady Statistic AP VP Percent: 0 %
Brady Statistic AP VS Percent: 99 %
Brady Statistic AS VP Percent: 0 %
Brady Statistic AS VS Percent: 1 %
Date Time Interrogation Session: 20220705132159
Implantable Lead Implant Date: 20180807
Implantable Lead Implant Date: 20180807
Implantable Lead Location: 753859
Implantable Lead Location: 753860
Implantable Lead Model: 5076
Implantable Lead Model: 5076
Implantable Pulse Generator Implant Date: 20180807
Lead Channel Impedance Value: 307 Ohm
Lead Channel Impedance Value: 502 Ohm
Lead Channel Pacing Threshold Amplitude: 0.625 V
Lead Channel Pacing Threshold Amplitude: 0.75 V
Lead Channel Pacing Threshold Pulse Width: 0.4 ms
Lead Channel Pacing Threshold Pulse Width: 0.4 ms
Lead Channel Setting Pacing Amplitude: 2 V
Lead Channel Setting Pacing Amplitude: 2.5 V
Lead Channel Setting Pacing Pulse Width: 0.4 ms
Lead Channel Setting Sensing Sensitivity: 5.6 mV

## 2020-09-06 NOTE — Telephone Encounter (Signed)
PERIOPERATIVE PRESCRIPTION FOR IMPLANTED CARDIAC DEVICE PROGRAMMING   Patient Information: Name: [name] [name]  DOB: September 14, 1929  MRN: [id number]    Planned Procedure:  BREAST LUMPECTOMY; STAGE 1 BREAST CANCER   Surgeon:  [place]; DR. Autumn Messing Date of Procedure:  TBD    Device Information:   Clinic EP Physician:   Thompson Grayer, MD Device Type:  Pacemaker Manufacturer and Phone #:  Medtronic: [id number] Pacemaker Dependent?:  No Date of Last Device Check:  09/06/20        Normal Device Function?:  Yes     Electrophysiologist's Recommendations:   Have magnet available. Provide continuous ECG monitoring when magnet is used or reprogramming is to be performed.  Procedure will likely interfere with device function.  Device should be programmed:  Asynchronous pacing during procedure and returned to normal programming after procedure  Per Device Clinic Standing Orders, [name]  09/06/2020 4:55 PM

## 2020-09-06 NOTE — Telephone Encounter (Signed)
Device recommendations sent via Right fax to [phone] ATTN: Carlene Coria, CMA

## 2020-09-06 NOTE — Telephone Encounter (Signed)
   [facility] Pre-operative Risk Assessment    Patient Name: [name] [name]  DOB: March 21, 1929  MRN: [id number]   HEARTCARE STAFF: - Please ensure there is not already an duplicate clearance open for this procedure. - Under Visit Info/Reason for Call, type in Other and utilize the format Clearance MM/DD/YY or Clearance TBD. Do not use dashes or single digits. - If request is for dental extraction, please clarify the # of teeth to be extracted. - If the patient is currently at the dentist's office, call Pre-Op APP to address. If the patient is not currently in the dentist office, please route to the Pre-Op pool  Request for surgical clearance:  What type of surgery is being performed? BREAST LUMPECTOMY; STAGE 1 BREAST CANCER   When is this surgery scheduled? TBD   What type of clearance is required (medical clearance vs. Pharmacy clearance to hold med vs. Both)? BOTH AND DEVICE CLEARANCE AS WELL  Are there any medications that need to be held prior to surgery and how long? ELIQUIS x  2 DAYS PRIOR TO PROCEDURE; WILL NEED DEVICE CLEARANCE AS WELL  Practice name and name of physician performing surgery? [facility]; DR. Autumn Messing   What is the office phone number? [phone]   7.   What is the office fax number? Charles Mix: [place], CMA  8.   Anesthesia type (None, local, MAC, general) ? GENERAL   Julaine Hua 09/06/2020, 2:37 PM  _________________________________________________________________   (provider comments below)

## 2020-09-07 ENCOUNTER — Ambulatory Visit: Payer: Medicare PPO | Admitting: Cardiology

## 2020-09-07 NOTE — Telephone Encounter (Signed)
Patient with diagnosis of Eliquis on A Fib for anticoagulation.    Procedure:  BREAST LUMPECTOMY; STAGE 1 BREAST CANCER    Date of procedure: TBD   CHA2DS2-VASc Score = 6  This indicates a 9.7% annual risk of stroke. The patient's score is based upon: CHF History: Yes HTN History: Yes Diabetes History: No Stroke History: No Vascular Disease History: Yes Age Score: 2 Gender Score: 1    CrCl 26 mL/min Platelet count 172K    Per office protocol, patient can hold Eliquis for 2 days prior to procedure.   Patient will not need bridging with Lovenox (enoxaparin) around procedure.  If not bridging, patient should restart Eliquis on the evening of procedure or day after, at discretion of procedure MD

## 2020-09-07 NOTE — Telephone Encounter (Signed)
   Name: [name] [name]  DOB: 1929-04-15  MRN: [id number]   Primary Cardiologist: Peter Martinique, MD / EP - Dr. [name]  Chart reviewed as part of pre-operative protocol coverage. Patient was contacted 09/07/2020 in reference to pre-operative risk assessment for pending surgery as outlined below.  [name] [name] was last seen on 08/10/20 by Dr. [name]. Cardiac history outlined with CAD s/p PCI 2018, PAF with SSS s/p PPM, TAVR 2018, HTN, HLD, hypothyroidism, hemorrhoids, OA, CKD IIIb, chronic diastolic CHF. RCRI 6.6% indicating low risk of CV complications. I reached out to patient for update on how she is doing. The patient affirms she has been doing well without any new cardiac symptoms, able to achieve >4 METS without angina or dyspnea.  Therefore, based on ACC/AHA guidelines, the patient would be at acceptable risk for the planned procedure without further cardiovascular testing.   Per PharmD, "Per office protocol, patient can hold Eliquis for 2 days prior to procedure.   Patient will not need bridging with Lovenox (enoxaparin) around procedure.  If not bridging, patient should restart Eliquis on the evening of procedure or day after, at discretion of procedure MD."  The patient was advised that if she develops new symptoms prior to surgery to contact our office to arrange for a follow-up visit, and she verbalized understanding.  I will route this recommendation to the requesting party via Epic fax function and remove from pre-op pool. Please call with questions.  Charlie Pitter, PA-C 09/07/2020, 1:05 PM

## 2020-09-10 ENCOUNTER — Other Ambulatory Visit: Payer: Self-pay | Admitting: Cardiology

## 2020-09-12 ENCOUNTER — Telehealth: Payer: Self-pay | Admitting: Cardiology

## 2020-09-12 ENCOUNTER — Encounter: Payer: Self-pay | Admitting: *Deleted

## 2020-09-12 NOTE — Telephone Encounter (Signed)
Patient called to say she had an incident on last night she needs to be seen about. She stated that there was discomfort on her last night in her shoulder. She stayed that she would like for dr. Martinique to take a look at it. She would like for cheryl to give her a call back. Please advise

## 2020-09-12 NOTE — Telephone Encounter (Signed)
Her symptoms do not sound cardiac in nature to me. I would just monitor for now. Ok to proceed with surgery  Key Cen Martinique MD, [facility]

## 2020-09-12 NOTE — Telephone Encounter (Signed)
Spoke to patient she states she had some cramping  in left shoulder past the joint off and on last evening . She states she also had pain around her left thumb hurting for most of the night.  She states shoulder pain is better today  , she still has pain around her left thumb ( near pressure point of her  wrist)  She states she has rest today,she states I am just little anxious because of the  cramping and pain.     RN question if she was short of breath , radiation of pain ,  chest pain  Patient states no but  had a" funny felling in  her chest  that moved to right breast "but that feeling is gone,she that occurred last evening.   She states she is concerned she was  recently cleared to have  cancer surgery. She states she wanted to know Dr Martinique thought and if did not think it was her heart  then What ?   RN informed patient she would defer to Dr Martinique , if this was not heart related Dr Martinique would want her to follow up with primary . She verbalized understanding.

## 2020-09-13 ENCOUNTER — Encounter: Payer: Self-pay | Admitting: *Deleted

## 2020-09-13 ENCOUNTER — Telehealth: Payer: Self-pay | Admitting: Hematology and Oncology

## 2020-09-13 NOTE — Telephone Encounter (Signed)
Scheduled appointment per 07/12 sch msg. Patient is aware. 

## 2020-09-13 NOTE — Telephone Encounter (Signed)
Spoke to patient. Relayed Dr Martinique comments. Patient voiced understanding.

## 2020-09-23 NOTE — Progress Notes (Signed)
Remote pacemaker transmission.   

## 2020-09-26 NOTE — Progress Notes (Addendum)
Surgical Instructions   Your procedure is scheduled on Tuesday, October 04, 2020.  Report to [facility] Main Entrance "A" at 05:30 A.M., then check in with the Admitting office.  Call [phone] if you have problems or questions between now and the morning of surgery:   Remember: Do not eat after midnight the night before your surgery  You may drink clear liquids until 04:30 the morning of your surgery.   Clear liquids allowed are: Water, Non-Citrus Juices (without pulp), Carbonated Beverages, Clear Tea, Black Coffee Only, and Gatorade   Take these medicines the morning of surgery with A SIP OF WATER  Acetaminophen (Tylenol) Amiodarone (Pacerone) Amlodipine (Norvasc) Ezetimibe (Zetia) Metoprolol Tartrate (Lopressor) Pantoprazole (Protonix) Synthroid  If needed you may take these medications the morning of surgery: Fluticasone (Flonase) Nitroglycerin (Nitrostat)   As of today, STOP taking any Aspirin (unless otherwise instructed by your surgeon) or Aspirin-containing products; NSAIDS - Aleve, Naproxen, Ibuprofen, Motrin, Advil, Goody's, BC's, all herbal medications, fish oil, and all vitamins.  Per cardiology, hold Eliquis for 2 days prior to surgery.           Do not wear jewelry or makeup Do not wear lotions, powders, perfumes, or deodorant. Do not shave 48 hours prior to surgery. Do not wear nail polish, gel polish, artificial nails, or any other type of covering on natural nails including fingernails and toenails. If patients have artificial nails, gel coating, etc. that need to be removed by a nail salon please have this removed prior to surgery or surgery may need to be canceled/delayed if the surgeon/ anesthesia feels like the patient is unable to be adequately monitored. Do not bring valuables to the hospital - [facility] is not responsible for any belongings or valuables.              Do NOT Smoke (Tobacco/Vaping) or drink Alcohol 24 hours prior to your  procedure  If you use a CPAP at night, you may bring all equipment for your overnight stay.   Contacts, glasses, hearing aids, dentures or partials may not be worn into surgery, please bring cases for these belongings   For patients admitted to the hospital, discharge time will be determined by your treatment team.   Patients discharged the day of surgery will not be allowed to drive home, and someone needs to stay with them for 24 hours.  ONLY ONE (1) SUPPORT PERSON MAY WAIT IN THE WAITING AREA WHILE YOU ARE IN SURGERY. NO VISITORS WILL BE ALLOWED IN PRE-OP WHERE PATIENTS GET READY FOR SURGERY.  TWO (2) VISITORS WILL BE ALLOWED IN YOUR ROOM IF YOU ARE ADMITTED AFTER SURGERY.  Minor children may have two parents present. Special consideration for safety and communication needs will be reviewed on a case by case basis.  Special instructions:    Oral Hygiene is also important to reduce your risk of infection.  Remember - BRUSH YOUR TEETH THE MORNING OF SURGERY WITH YOUR REGULAR TOOTHPASTE   [place]- Preparing For Surgery  Before surgery, you can play an important role. Because skin is not sterile, your skin needs to be as free of germs as possible. You can reduce the number of germs on your skin by washing with CHG (chlorahexidine gluconate) Soap before surgery.  CHG is an antiseptic cleaner which kills germs and bonds with the skin to continue killing germs even after washing.     Please do not use if you have an allergy to CHG or antibacterial soaps. If  your skin becomes reddened/irritated stop using the CHG.  Do not shave (including legs and underarms) for at least 48 hours prior to first CHG shower. It is OK to shave your face.  Please follow these instructions carefully.     Shower the NIGHT BEFORE SURGERY and the MORNING OF SURGERY with CHG Soap.   If you chose to wash your hair, wash your hair first as usual with your normal shampoo. After you shampoo, rinse your hair and body  thoroughly to remove the shampoo.    Then [employer] and genitals (private parts) with your normal soap and rinse thoroughly to remove soap.  Next use the CHG Soap as you would any other liquid soap. You can apply CHG directly to the skin and wash gently with a clean washcloth.   Apply the CHG Soap to your body ONLY FROM THE NECK DOWN.  Do not use on open wounds or open sores. Avoid contact with your eyes, ears, mouth and genitals (private parts). Wash Face and genitals (private parts)  with your normal soap.   Wash thoroughly, paying special attention to the area where your surgery will be performed.  Thoroughly rinse your body with warm water from the neck down.  DO NOT shower/wash with your normal soap after using and rinsing off the CHG Soap.  Pat yourself dry with a CLEAN TOWEL.  Wear CLEAN PAJAMAS to bed the night before surgery  Place CLEAN SHEETS on your bed the night before your surgery  DO NOT SLEEP WITH PETS.   Day of Surgery:  Take a shower with CHG soap. Wear Clean/Comfortable clothing the morning of surgery Do not apply any deodorants/lotions.   Remember to brush your teeth WITH YOUR REGULAR TOOTHPASTE.   Please read over the following fact sheets that you were given.

## 2020-09-27 ENCOUNTER — Encounter (HOSPITAL_COMMUNITY)
Admission: RE | Admit: 2020-09-27 | Discharge: 2020-09-27 | Disposition: A | Discharge status: Home / Self Care | Payer: Medicare PPO | Source: Ambulatory Visit | Attending: General Surgery | Admitting: General Surgery

## 2020-09-27 ENCOUNTER — Encounter (HOSPITAL_COMMUNITY): Payer: Self-pay

## 2020-09-27 ENCOUNTER — Other Ambulatory Visit: Payer: Self-pay

## 2020-09-27 DIAGNOSIS — Z01812 Encounter for preprocedural laboratory examination: Secondary | ICD-10-CM | POA: Insufficient documentation

## 2020-09-27 LAB — BASIC METABOLIC PANEL
Anion gap: 6 (ref 5–15)
BUN: 33 mg/dL — ABNORMAL HIGH (ref 8–23)
CO2: 30 mmol/L (ref 22–32)
Calcium: 9.8 mg/dL (ref 8.9–10.3)
Chloride: 98 mmol/L (ref 98–111)
Creatinine, Ser: 1.25 mg/dL — ABNORMAL HIGH (ref 0.44–1.00)
GFR, Estimated: 41 mL/min — ABNORMAL LOW (ref >60)
Glucose, Bld: 98 mg/dL (ref 70–99)
Potassium: 4.6 mmol/L (ref 3.5–5.1)
Sodium: 134 mmol/L — ABNORMAL LOW (ref 135–145)

## 2020-09-27 LAB — CBC
HCT: 42 % (ref 36.0–46.0)
Hemoglobin: 13.7 g/dL (ref 12.0–15.0)
MCH: 34.8 pg — ABNORMAL HIGH (ref 26.0–34.0)
MCHC: 32.6 g/dL (ref 30.0–36.0)
MCV: 106.6 fL — ABNORMAL HIGH (ref 80.0–100.0)
Platelets: 179 10*3/uL (ref 150–400)
RBC: 3.94 MIL/uL (ref 3.87–5.11)
RDW: 13.9 % (ref 11.5–15.5)
WBC: 6.6 10*3/uL (ref 4.0–10.5)
nRBC: 0 % (ref 0.0–0.2)

## 2020-09-27 NOTE — Progress Notes (Addendum)
PCP - [name], MD Cardiologist - Peter Martinique, MD Pacemaker- Thompson Grayer, MD  PPM/ICD - Medtronic Device Orders - sent request Rep Notified - yes  Chest x-ray - n/a EKG - 08/10/20 Stress Test - 10/03/2016 ECHO - 07/26/20 Cardiac Cath - 11/2016  Sleep Study - pt denies  Fasting Blood Sugar - n/a   Blood Thinner Instructions: Per MD, pt to stop taking eliquis 2 days prior to surgery, pt aware Aspirin Instructions:n/a  ERAS Protcol - yes, no drink  COVID TEST- no, ambulatory surgery  Anesthesia review: yes, pt has cardiac hx (CAD, s/p TAVR, and pacemaker)  Patient denies shortness of breath, fever, cough and chest pain at PAT appointment   All instructions explained to the patient, with a verbal understanding of the material. Patient agrees to go over the instructions while at home for a better understanding. Patient also instructed to self quarantine after being tested for COVID-19. The opportunity to ask questions was provided.

## 2020-09-28 NOTE — Anesthesia Preprocedure Evaluation (Addendum)
Anesthesia Evaluation  Patient identified by MRN, date of birth, ID band Patient awake    Reviewed: Allergy & Precautions, NPO status , Patient's Chart, lab work & pertinent test results, reviewed documented beta blocker date and time   History of Anesthesia Complications (+) PONV and history of anesthetic complications  Airway Mallampati: II  TM Distance: >3 FB Neck ROM: Full    Dental  (+) Dental Advisory Given, Missing   Pulmonary former smoker,    Pulmonary exam normal breath sounds clear to auscultation       Cardiovascular hypertension, Pt. on medications and Pt. on home beta blockers + CAD and + Cardiac Stents (DES to LAD)  Normal cardiovascular exam+ dysrhythmias Atrial Fibrillation + pacemaker + Valvular Problems/Murmurs (AS s/p TAVR)  Rhythm:Regular Rate:Normal     Neuro/Psych negative neurological ROS     GI/Hepatic Neg liver ROS, GERD  Medicated,  Endo/Other  Hypothyroidism   Renal/GU Renal InsufficiencyRenal disease     Musculoskeletal  (+) Arthritis ,   Abdominal   Peds  Hematology negative hematology ROS (+)   Anesthesia Other Findings   Reproductive/Obstetrics                           Anesthesia Physical Anesthesia Plan  ASA: 4  Anesthesia Plan: General   Post-op Pain Management:    Induction: Intravenous  PONV Risk Score and Plan: 4 or greater and TIVA, Dexamethasone and Ondansetron  Airway Management Planned: LMA  Additional Equipment:   Intra-op Plan:   Post-operative Plan: Extubation in OR  Informed Consent: I have reviewed the patients History and Physical, chart, labs and discussed the procedure including the risks, benefits and alternatives for the proposed anesthesia with the patient or authorized representative who has indicated his/her understanding and acceptance.     Dental advisory given  Plan Discussed with: CRNA  Anesthesia Plan  Comments: (PAT note written 09/28/2020 by Myra Gianotti, PA-C.  Will need sterile magnet or reprogramming of PPM to asynchronous on DOS.)      Anesthesia Quick Evaluation

## 2020-09-28 NOTE — Progress Notes (Signed)
Anesthesia Chart Review:  Case: [id number] Date/Time: 10/04/20 0715   Procedure: LEFT BREAST LUMPECTOMY WITH RADIOACTIVE SEED LOCALIZATION (Left: Breast)   Anesthesia type: General   Pre-op diagnosis: LEFT BREAST CANCER   Location: [place] OR ROOM 01 / [place] OR   Surgeons: Jovita Kussmaul, MD       DISCUSSION: Patient is an 87 year old female scheduled for the above procedure.  History includes former smoker (quit 1980), post-operative N/V (severe), HLD, HTN, CAD (s/p DES LAD 12/20/16), SSS (s/p PPM; right atrail lead failure s/p new dual chamber Medtronic Adapta L ADDRL1 PPM 10/09/16), severe AS (s/p TAVR 01/29/17), PAF, chronic diastolic CHF, hypothyroidism, GERD, CKD (stage III), skin cancer excision (BCC, SCC), tonsillectomy, hysterectomy.   Preoperative cardiology input outlined on 09/07/20 by Melina Copa, PA-C,  "Patient was contacted 09/07/2020 in reference to pre-operative risk assessment for pending surgery as outlined below.  [name] [name] was last seen on 08/10/20 by Dr. [name].Marland KitchenMarland KitchenRCRI 6.6% indicating low risk of CV complications. I reached out to patient for update on how she is doing. The patient affirms she has been doing well without any new cardiac symptoms, able to achieve >4 METS without angina or dyspnea.  Therefore, based on ACC/AHA guidelines, the patient would be at acceptable risk for the planned procedure without further cardiovascular testing."  Per pharmacy protocol patient can hold Eliquis 2 days prior to procedure and will not need bridging with Lovenox.  If not bridging, restarting Eliquis on the evening of procedure or day after recommended at discretion of surgeon.  Patient aware to hold Eliquis 2 days prior to surgery.  Of note patient had some vague symptoms including left shoulder cramping and feeling anxious on 09/11/2020.  This was communicated with Dr. Martinique.  He felt she was okay to monitor symptoms for now and "Ok to proceed with surgery".   [facility]L 10/03/2020 at 1 PM at [facility].     Anesthesia team to evaluate on day of surgery.  Perioperative cardiac device prescription form is still pending from [facility].  Medtronic rep was notified of surgery plans by PAT RN.  PPM generated is located on the left chest.   VS: BP (!) 152/69   Pulse 84   Temp 36.5 C (Oral)   Resp 18   Ht '5\' 6"'$  (1.676 m)   Wt 57.2 kg   SpO2 98%   BMI 20.34 kg/m    PROVIDERS: [name], MD is PCP  Martinique, Peter, MD is cardiologist.  Last visit 06/07/2020.  70-monthfollow-up planned. AThompson Grayer MD is EP cardiologist. Last cardiology visit with Dr. ARayann Heman6/10/2020.  Normal PPM function.  She was device dependent (atrial) that day.  A. fib burden 0.1% with low-dose amiodarone.  Continue Eliquis.  No ischemic CAD symptoms.  Annual follow-up recommended.  She is not on aspirin due to bleeding risk. GNicholas Lose MD is HEM-ONC KGery Pray MD is RAD-ONC   LABS: Preoperative labs noted.  Creatinine 1.25, consistent with previous result. (all labs ordered are listed, but only abnormal results are displayed)  Labs Reviewed  BASIC METABOLIC PANEL - Abnormal; Notable for the following components:      Result Value   Sodium 134 (*)    BUN 33 (*)    Creatinine, Ser 1.25 (*)    GFR, Estimated 41 (*)    All other components within normal limits  CBC - Abnormal; Notable for the following components:   MCV 106.6 (*)    MCH 34.8 (*)    All  other components within normal limits    PFTs 02/14/16: FVC 1.85 (72%), FEV1 1.28 (68%), DLCO unc 12.25 (47%).    EKG: 08/10/20 ([facility]): A-paced rhythm. First degree AV block.   CV: Echo 07/26/20: IMPRESSIONS   1. Left ventricular ejection fraction, by estimation, is 60 to 65%. The  left ventricle has normal function. The left ventricle has no regional  wall motion abnormalities. Left ventricular diastolic parameters are  indeterminate.   2. Right ventricular systolic function is normal. The right ventricular  size is normal.   3.  Mild mitral valve regurgitation.   4. Tricuspid valve regurgitation is moderate.   5. S/p TAVR (01/29/17) 23 mm Edwards bioprosthesis. Peak and mean  gradients through the valve are 17 and 8 mm Hg repcectively AVA (VTI 1.53  cm2) No significant change from gradients in echo of 01/22/18 . Aortic  valve regurgitation is mild.    Carotid U/S 10/17/18:  Summary: Right Carotid: Non-hemodynamically significant plaque <50% noted in the  CCA. The extracranial vessels were near-normal with only minimal wall thickening or plaque. Left Carotid: Non-hemodynamically significant plaque noted in the CCA. The extracranial vessels were near-normal with only minimal wall thickening or plaque.  Vertebrals: Both vertebral arteries demonstrate antegrade flow. Subclavians: Normal flow hemodynamics were seen in bilateral subclavian arteries.   Cardiac cath 11/27/16: Mid LAD lesion, 30 %stenosed. Mid LAD to Dist LAD lesion, 85 %stenosed. Prox RCA to Mid RCA lesion, 20 %stenosed. Dist RCA lesion, 30 %stenosed. Ost LM lesion, 10 %stenosed. LV end diastolic pressure is normal. LV end diastolic pressure is normal.  1. Single vessel obstructive CAD with 85% mid LAD stenosis 2. Severe aortic stenosis. Mean gradient of 40 mm Hg. AVA 0.61 cm squared with index .37. 3. Normal cardiac output. 4. Normal right heart and LV filling pressures Plan: will discuss options for AVR. I think her best option based on age and co-morbidities would be TAVR with stenting of mid LAD. Will discuss with heart valve team. PCI 12/20/16: 1. Successful stenting (Promus Prem MR 2.75 X 16) of the mid LAD.  Plan: IV hydration post procedure. Follow renal function closely. Continue DAPT for one month then discontinue ASA. May start Eliquis tomorrow if no bleeding. Referral for TAVR evaluation.     Past Medical History:  Diagnosis Date   CKD (chronic kidney disease)    Complication of anesthesia    Coronary artery disease    a. 12/20/2016:  s/p DES to LAD    Diverticulosis    Family history of adverse reaction to anesthesia    sister also has post-op n/v   Family history of breast cancer 09/01/2020   GERD (gastroesophageal reflux disease)    Hemorrhoids    Hyperlipidemia    Hypertension    Hypothyroidism    Osteoarthritis    Paroxysmal atrial fibrillation ([place])    a. on [place] with Xarelto but switched to Eliquis after stenting.    Personal history of skin cancer 09/01/2020   PONV (postoperative nausea and vomiting)    Presence of permanent cardiac pacemaker    S/P TAVR (transcatheter aortic valve replacement) 01/29/2017   23 mm Edwards Sapien 3 transcatheter heart valve placed via percutaneous right transfemoral approach     Past Surgical History:  Procedure Laterality Date   ABDOMINAL HYSTERECTOMY  1980   BASAL CELL CARCINOMA EXCISION     CARDIAC CATHETERIZATION  ~ 11/2016   CATARACT EXTRACTION W/ INTRAOCULAR LENS  IMPLANT, BILATERAL Bilateral    CORONARY ANGIOPLASTY WITH STENT  PLACEMENT  12/20/2016   "1 stent"   CORONARY STENT INTERVENTION N/A 12/20/2016   Procedure: CORONARY STENT INTERVENTION;  Surgeon: Martinique, Peter M, MD;  Location: [place] CV LAB;  Service: Cardiovascular;  Laterality: N/A;   FINGER SURGERY Left    "fell; developed skiers thumb; had to operate on it"   INSERT / REPLACE / [place]   for syptomatic bradycardia and syncope -- in [place] (IM) NAIL INTERTROCHANTERIC Left 10/19/2018   Procedure: INTRAMEDULLARY (IM) NAIL INTERTROCHANTRIC;  Surgeon: Altamese Ayr, MD;  Location: [place];  Service: Orthopedics;  Laterality: Left;   LEAD REVISION/REPAIR N/A 10/09/2016   New left subclavian MDT Adapta L PPM dual chamber system implanted by Dr [name] with previously placed R subclavian system abandoned   PACEMAKER GENERATOR CHANGE  2001   pulse generator replacement by Dr. Pincus Large GENERATOR CHANGE  04/01/2008   PPM Medtronic -- model # [id number] serial #  [id number] -- pulse generator replacement by Dr. Verlon Setting    RIGHT/LEFT [place] N/A 11/27/2016   Procedure: RIGHT/LEFT HEART CATH AND CORONARY ANGIOGRAPHY;  Surgeon: Martinique, Peter M, MD;  Location: [place] CV LAB;  Service: Cardiovascular;  Laterality: N/A;   SQUAMOUS CELL CARCINOMA EXCISION     TEE WITHOUT CARDIOVERSION N/A 01/29/2017   Procedure: TRANSESOPHAGEAL ECHOCARDIOGRAM (TEE);  Surgeon: Sherren Mocha, MD;  Location: [place];  Service: Open Heart Surgery;  Laterality: N/A;   TONSILLECTOMY     TRANSCATHETER AORTIC VALVE REPLACEMENT, TRANSFEMORAL N/A 01/29/2017   Procedure: TRANSCATHETER AORTIC VALVE REPLACEMENT, TRANSFEMORAL;  Surgeon: Sherren Mocha, MD;  Location: [place];  Service: Open Heart Surgery;  Laterality: N/A;    MEDICATIONS:  acetaminophen (TYLENOL) 325 MG tablet   amiodarone (PACERONE) 200 MG tablet   amLODipine (NORVASC) 5 MG tablet   calcium carbonate (OS-CAL) 600 MG TABS   ELIQUIS 2.5 MG TABS tablet   ezetimibe (ZETIA) 10 MG tablet   feeding supplement, ENSURE ENLIVE, (ENSURE ENLIVE) LIQD   fluticasone (FLONASE) 50 MCG/ACT nasal spray   furosemide (LASIX) 20 MG tablet   losartan (COZAAR) 25 MG tablet   metoprolol tartrate (LOPRESSOR) 25 MG tablet   Multiple Vitamins-Minerals (PRESERVISION AREDS 2) CAPS   nitroGLYCERIN (NITROSTAT) 0.4 MG SL tablet   pantoprazole (PROTONIX) 40 MG tablet   polyethylene glycol (MIRALAX / GLYCOLAX) 17 g packet   polyvinyl alcohol (LIQUIFILM TEARS) 1.4 % ophthalmic solution   SYNTHROID 125 MCG tablet   No current facility-administered medications for this encounter.    Myra Gianotti, PA-C Surgical Short Stay/Anesthesiology [facility] Phone [phone] [facility] Phone [phone] 09/28/2020 3:52 PM

## 2020-09-30 ENCOUNTER — Encounter: Payer: Self-pay | Admitting: Internal Medicine

## 2020-09-30 NOTE — Progress Notes (Signed)
PERIOPERATIVE PRESCRIPTION FOR IMPLANTED CARDIAC DEVICE PROGRAMMING  Patient Information: Name:  CIMBERLY URESTI  DOB:  Jun 25, 1929  MRN:  [id number]    Planned Procedure:  Left Breast Radioactive Seed Surgeon:  Dr. Autumn Messing  Date of Procedure:  10/04/2020  Cautery will be used.  Position during surgery:  Supine   Please send documentation back to:  [facility] (Fax # [phone])  Device Information:  Clinic EP Physician:  Thompson Grayer, MD   Device Type:  Pacemaker Manufacturer and Phone #:  Medtronic: [id number] Pacemaker Dependent?:  No. Date of Last Device Check:  09/06/20 Normal Device Function?:  Yes.    Electrophysiologist's Recommendations:  Have magnet available. Provide continuous ECG monitoring when magnet is used or reprogramming is to be performed.  Procedure will likely interfere with device function.  Device should be programmed:  Asynchronous pacing during procedure and returned to normal programming after procedure Unable to clear on lumpectomy due to location not provided after requested.   Per Device Clinic Standing Orders, Simone Curia, RN  8:05 AM 09/30/2020

## 2020-09-30 NOTE — Progress Notes (Signed)
[name] [name] Date of Birth: 1929-10-19 Medical Record [id number]  History of Present Illness: [name] is seen today for follow up CAD and AV stenosis.  She has a history of paroxysmal atrial fibrillation with tachy-brady syndrome and is s/p pacemaker implant. She is on chronic anticoagulation with Eliquis. Echo in July 2015 showed moderate aortic stenosis that had progressed since 2012. Repeat in July 2016 and July 2017 showed no change. In April 2017 she had atrial fibrillation that converted spontaneously. This was felt to be triggered by a steroid injection. Her metoprolol was increased at that time. When seen in June 2017 she was noted to be in persistent Afib. We placed her on Flecainide but this was later stopped due to side effects. She was seen in November with recurrent and persistent Afib. Rate was controlled but she was symptomatic. Seen in Afib clinic and started on amiodarone with subsequent conversion to NSR.   She later developed symptoms of dyspnea.   She had PFTs in December that showed mild obstructive defect with marked decrease in diffusion capacity.  Myoview study was normal. When seen by Dr. [name] in late July it was noted that she had failure of her RA lead. Her device was reprogrammed but she developed pectoral stimulation. She underwent explantation of her old generator and placement of a new left chest dual chamber pacemaker on October 09 2016. Because of her ongoing symptoms we recommended a right and left heart cath and this was done on 11/27/16. This demonstrated severe AS as well as a severe stenosis in the mid LAD. She subsequently underwent successful PCI of the LAD with DES on 12/20/16 with 2.75 x 16 mm Promus stent.   She underwent TAVR on November 27,2018  with a #23 Edwards Sapien valve. Post op course complicated by pericarditis and AFib with RVR she was treated with anti-inflammatories and Amiodarone. Later Eliquis resumed and ASA stopped. Still on Plavix for  stent. Follow up Echo was [facility]. Repeat Echo 01/22/18 still showed good results. Pacemaker check on 12/12/17 showed normal function without atrial high rate episodes. When seen in the valve clinic on 01/22/18 Plavix was discontinued.   She did have a bout of abdominal pain in November. Had testing by Dr Virgina Jock without clear etiology. Abd [place] negative. She has recovered well from her hip surgery but still gets tired more easily.   She underwent  left breast lumpectomy with Dr Marlou Starks for newly diagnosed breast mass on 10/04/20. Has appointment with oncology later this week.   She has rare palpitations. No chest pain or dyspnea. No edema. Feels well.      Current Outpatient Medications on File Prior to Visit  Medication Sig Dispense Refill   amiodarone (PACERONE) 200 MG tablet TAKE 1/2 TABLET(100 MG) BY MOUTH DAILY (Patient taking differently: Take 100 mg by mouth daily.) 45 tablet 3   amLODipine (NORVASC) 5 MG tablet TAKE 1 TABLET(5 MG) BY MOUTH DAILY (Patient taking differently: Take 5 mg by mouth daily.) 90 tablet 3   calcium carbonate (OS-CAL) 600 MG TABS Take 600 mg by mouth 2 (two) times daily with a meal.     ELIQUIS 2.5 MG TABS tablet TAKE 1 TABLET(2.5 MG) BY MOUTH TWICE DAILY (Patient taking differently: Take 2.5 mg by mouth 2 (two) times daily.) 180 tablet 1   ezetimibe (ZETIA) 10 MG tablet TAKE 1 TABLET(10 MG) BY MOUTH DAILY (Patient taking differently: Take 10 mg by mouth daily.) 90 tablet 3   feeding supplement, ENSURE  ENLIVE, (ENSURE ENLIVE) LIQD Take 237 mLs by mouth 2 (two) times daily between meals. (Patient taking differently: Take 237 mLs by mouth daily.) 237 mL 12   fluticasone (FLONASE) 50 MCG/ACT nasal spray Place 2 sprays into both nostrils daily as needed for allergies (congestion).     furosemide (LASIX) 20 MG tablet TAKE 1 TABLET BY MOUTH EVERY DAY; EXCEPT ON MONDAY AND FRIDAY (Patient taking differently: Take 20 mg by mouth every other day.) 90 tablet 3   losartan (COZAAR) 25 MG  tablet Take 1 tablet (25 mg total) by mouth daily. 90 tablet 1   metoprolol tartrate (LOPRESSOR) 25 MG tablet TAKE 1 AND 1/2 TABLETS(37.5 MG) BY MOUTH TWICE DAILY (Patient taking differently: Take 37.5 mg by mouth 2 (two) times daily.) 270 tablet 2   Multiple Vitamins-Minerals (PRESERVISION AREDS 2) CAPS Take 1 capsule by mouth 2 (two) times daily.     nitroGLYCERIN (NITROSTAT) 0.4 MG SL tablet Place 1 tablet (0.4 mg total) under the tongue every 5 (five) minutes as needed for chest pain. 25 tablet 12   pantoprazole (PROTONIX) 40 MG tablet Take 1 tablet (40 mg total) by mouth 2 (two) times daily before a meal. (Patient taking differently: Take 40 mg by mouth daily.) 180 tablet 3   polyvinyl alcohol (LIQUIFILM TEARS) 1.4 % ophthalmic solution Place 1-2 drops into both eyes 3 (three) times daily as needed for dry eyes.     SYNTHROID 125 MCG tablet Take 125 mcg by mouth daily before breakfast.      traMADol (ULTRAM) 50 MG tablet Take 1 tablet (50 mg total) by mouth every 6 (six) hours as needed. 15 tablet 0   No current facility-administered medications on file prior to visit.    Allergies  Allergen Reactions   Penicillins Anaphylaxis and Other (See Comments)    Has patient had a PCN reaction causing immediate rash, facial/tongue/throat swelling, SOB or lightheadedness with hypotension: Yes Has patient had a PCN reaction causing severe rash involving mucus membranes or skin necrosis: No Has patient had a PCN reaction that required hospitalization: No Has patient had a PCN reaction occurring within the last 10 years: No If all of the above answers are "NO", then may proceed with Cephalosporin use.    Tetanus Toxoid Anaphylaxis   Demerol Nausea Only    Severe nausea   Clindamycin/Lincomycin     Sores in mouth   Codeine Nausea Only    Severe nausea    Past Medical History:  Diagnosis Date   CKD (chronic kidney disease)    Complication of anesthesia    Coronary artery disease    a.  12/20/2016: s/p DES to LAD    Diverticulosis    Family history of adverse reaction to anesthesia    sister also has post-op n/v   Family history of breast cancer 09/01/2020   GERD (gastroesophageal reflux disease)    Hemorrhoids    Hyperlipidemia    Hypertension    Hypothyroidism    Osteoarthritis    Paroxysmal atrial fibrillation (HCC)    a. on [place] with Xarelto but switched to Eliquis after stenting.    Personal history of skin cancer 09/01/2020   PONV (postoperative nausea and vomiting)    Presence of permanent cardiac pacemaker    S/P TAVR (transcatheter aortic valve replacement) 01/29/2017   23 mm Edwards Sapien 3 transcatheter heart valve placed via percutaneous right transfemoral approach     Past Surgical History:  Procedure Laterality Date   ABDOMINAL HYSTERECTOMY  1980   BASAL CELL CARCINOMA EXCISION     BREAST LUMPECTOMY WITH RADIOACTIVE SEED LOCALIZATION Left 10/04/2020   Procedure: LEFT BREAST LUMPECTOMY WITH RADIOACTIVE SEED LOCALIZATION;  Surgeon: Jovita Kussmaul, MD;  Location: [place];  Service: General;  Laterality: Left;   CARDIAC CATHETERIZATION  ~ 11/2016   CATARACT EXTRACTION W/ INTRAOCULAR LENS  IMPLANT, BILATERAL Bilateral    CORONARY ANGIOPLASTY WITH STENT PLACEMENT  12/20/2016   "1 stent"   CORONARY STENT INTERVENTION N/A 12/20/2016   Procedure: CORONARY STENT INTERVENTION;  Surgeon: Martinique, Tiger Spieker M, MD;  Location: [facility];  Service: Cardiovascular;  Laterality: N/A;   FINGER SURGERY Left    "fell; developed skiers thumb; had to operate on it"   INSERT / REPLACE / [place]   for syptomatic bradycardia and syncope -- in [place] (IM) NAIL INTERTROCHANTERIC Left 10/19/2018   Procedure: INTRAMEDULLARY (IM) NAIL INTERTROCHANTRIC;  Surgeon: Altamese Coldfoot, MD;  Location: [place] [facility];  Service: Orthopedics;  Laterality: Left;   LEAD REVISION/REPAIR N/A 10/09/2016   New left subclavian MDT Adapta L PPM dual chamber system  implanted by Dr [name] with previously placed R subclavian system abandoned   PACEMAKER GENERATOR CHANGE  2001   pulse generator replacement by Dr. Pincus Large GENERATOR CHANGE  04/01/2008   PPM Medtronic -- model # [id number] serial # [id number] -- pulse generator replacement by Dr. Verlon Setting    RIGHT/LEFT [place] N/A 11/27/2016   Procedure: RIGHT/LEFT HEART CATH AND CORONARY ANGIOGRAPHY;  Surgeon: Martinique, Shiana Rappleye M, MD;  Location: [facility];  Service: Cardiovascular;  Laterality: N/A;   SQUAMOUS CELL CARCINOMA EXCISION     TEE WITHOUT CARDIOVERSION N/A 01/29/2017   Procedure: TRANSESOPHAGEAL ECHOCARDIOGRAM (TEE);  Surgeon: Sherren Mocha, MD;  Location: [facility];  Service: Open Heart Surgery;  Laterality: N/A;   TONSILLECTOMY     TRANSCATHETER AORTIC VALVE REPLACEMENT, TRANSFEMORAL N/A 01/29/2017   Procedure: TRANSCATHETER AORTIC VALVE REPLACEMENT, TRANSFEMORAL;  Surgeon: Sherren Mocha, MD;  Location: [facility];  Service: Open Heart Surgery;  Laterality: N/A;    Social History   Tobacco Use  Smoking Status Former   Packs/day: 1.50   Years: 30.00   Pack years: 45.00   Types: Cigarettes   Quit date: 03/05/1978   Years since quitting: 42.6  Smokeless Tobacco Never    Social History   Substance and Sexual Activity  Alcohol Use No    Family History  Problem Relation Age of Onset   Alzheimer's disease Mother    Kidney cancer Father        dx 34s   Breast cancer Maternal Aunt 44   Breast cancer Cousin        maternal cousin, dx >50    Review of Systems: As noted in history of present illness.  All other systems were reviewed and are negative.  Physical Exam: BP 120/70   Pulse 67   Ht '5\' 6"'$  (1.676 m)   Wt 125 lb 9.6 oz (57 kg)   SpO2 100%   BMI 20.27 kg/m  GENERAL:  Well appearing, elderly WF in NAD. Walks with cane HEENT:  PERRL, EOMI, sclera are clear. Oropharynx is clear. NECK:  No jugular venous distention, carotid upstroke brisk  and symmetric, no bruits, no thyromegaly or adenopathy LUNGS:  Clear to auscultation bilaterally CHEST:  Unremarkable HEART:  RRR,  PMI not displaced or sustained,S1 and S2 within normal limits, no S3, no S4: no  clicks, no rubs, very soft systolic murmur at the apex. ABD:  Soft, nontender. BS +, no masses or bruits. No hepatomegaly, no splenomegaly EXT:  2 + pulses throughout, tr edema, no cyanosis no clubbing SKIN:  Warm and dry.  No rashes NEURO:  Alert and oriented x 3. Cranial nerves II through XII intact. PSYCH:  Cognitively intact   LABORATORY DATA:  Lab Results  Component Value Date   WBC 6.6 09/27/2020   HGB 13.7 09/27/2020   HCT 42.0 09/27/2020   PLT 179 09/27/2020   GLUCOSE 98 09/27/2020   CHOL 219 (H) 02/05/2018   TRIG 70 02/05/2018   HDL 103 02/05/2018   LDLCALC 102 (H) 02/05/2018   ALT 12 08/31/2020   AST 23 08/31/2020   NA 134 (L) 09/27/2020   K 4.6 09/27/2020   CL 98 09/27/2020   CREATININE 1.25 (H) 09/27/2020   BUN 33 (H) 09/27/2020   CO2 30 09/27/2020   TSH 4.080 06/07/2020   INR 1.23 01/29/2017   HGBA1C 5.5 01/28/2017   Labs dated 07/15/15: cholesterol 229, triglycerides 111, HDL 76, LDL 131.  Dated 07/19/16: cholesterol 247, triglycerides 71, HDL 88, LDL 145. BUN 28, creatinine 1.3. Other chemistries, CBC and TSH normal. Dated 03/15/17: creatinine 1.7, Hgb 11.3. Dated 10/22/17: cholesterol 251, triglycerides 66, HDL 93, LDL 145. Hgb 11.7, creatinine 1.3. TSH and ALT normal. Dated 01/12/19: cholesterol 224, triglycerides 105, HDL 96, LDL 107.  Dated 06/15/19: BUN 36, creatinine 1.5. CMET otherwise normal. CBC and TSH normal.  Echo 07/26/20: 1. Left ventricular ejection fraction, by estimation, is 60 to 65%. The left ventricle has normal function. The left ventricle has no regional wall motion abnormalities. Left ventricular diastolic parameters are indeterminate. 2. Right ventricular systolic function is normal. The right ventricular size is normal. 3. Mild  mitral valve regurgitation. 4. Tricuspid valve regurgitation is moderate. 5. S/p TAVR (01/29/17) 23 mm Edwards bioprosthesis. Peak and mean gradients through the valve are 17 and 8 mm Hg repcectively AVA (VTI 1.53 cm2) No significant change from gradients in echo of 01/22/18 . Aortic valve regurgitation is mild. Left Ventricle: Left ventricular ejection fraction, by estimation, is 60 to 65%. The left ventricle has normal function. The left ventricle has no regional wall motion abnormalities. The left ventricular internal cavity size was normal in size. There is no left ventricular hypertrophy. Left ventricular diastolic parameters are indeterminate. Right Ventricle: The right ventricular size is normal. Right vetricular wall thickness was not assessed. Right ventricular systolic function is normal. Left Atrium: Left atrial size was normal in size. Right Atrium: Right atrial size was normal in size. Pericardium: There is no evidence of pericardial effusion. Mitral Valve: There is mild thickening of the mitral valve leaflet(s). Mild mitral annular calcification. Mild  Assessment / Plan: 1. Atrial fibrillation with sick sinus syndrome. Prior history of  persistent AFib.  Now on amiodarone 100 mg daily. Maintaining NSR.  Continue long-term anticoagulation. On  Eliquis 2.5 mg bid.  Dose adjusted for age and renal function. Labs are up to date.   2. Aortic stenosis-   symptomatic. Now s/p TAVR. Stable exam. Recent Echo looked good.   3. CAD with significant single vessel obstructive disease in the mid LAD. S/p PCI of the LAD with DES October 2018.  On Eliquis. No ASA due to bleeding risk.  4. HTN- well controlled.  Will continue metoprolol Bid, take losartan in the am. Amlodipine in the pm.   5. Hyperlipidemia. Intolerant of statins. Now on Zetia 10  mg daily.   6. Chronic diastolic CHF. She is well compensated. No significant edema  7. S/p femoral neck fracture with ORIF. Recovered.   8.  Breast CA. S/p lumpectomy. Awaiting final path and oncology consultation.   Follow up in 6 months.

## 2020-10-03 DIAGNOSIS — C50812 Malignant neoplasm of overlapping sites of left female breast: Secondary | ICD-10-CM | POA: Diagnosis not present

## 2020-10-04 ENCOUNTER — Encounter (HOSPITAL_COMMUNITY)
Admission: RE | Disposition: A | Discharge status: Home / Self Care | Payer: Self-pay | Source: Home / Self Care | Attending: General Surgery

## 2020-10-04 ENCOUNTER — Telehealth: Payer: Self-pay

## 2020-10-04 ENCOUNTER — Ambulatory Visit (HOSPITAL_COMMUNITY)
Admission: RE | Admit: 2020-10-04 | Discharge: 2020-10-04 | Disposition: A | Discharge status: Home / Self Care | Payer: Medicare PPO | Attending: General Surgery | Admitting: General Surgery

## 2020-10-04 ENCOUNTER — Ambulatory Visit (HOSPITAL_COMMUNITY): Payer: Medicare PPO | Admitting: Vascular Surgery

## 2020-10-04 ENCOUNTER — Encounter (HOSPITAL_COMMUNITY): Payer: Self-pay | Admitting: General Surgery

## 2020-10-04 ENCOUNTER — Other Ambulatory Visit: Payer: Self-pay

## 2020-10-04 ENCOUNTER — Ambulatory Visit (HOSPITAL_COMMUNITY): Payer: Medicare PPO | Admitting: Anesthesiology

## 2020-10-04 DIAGNOSIS — Z7901 Long term (current) use of anticoagulants: Secondary | ICD-10-CM | POA: Insufficient documentation

## 2020-10-04 DIAGNOSIS — C50912 Malignant neoplasm of unspecified site of left female breast: Secondary | ICD-10-CM | POA: Diagnosis not present

## 2020-10-04 DIAGNOSIS — Z8249 Family history of ischemic heart disease and other diseases of the circulatory system: Secondary | ICD-10-CM | POA: Diagnosis not present

## 2020-10-04 DIAGNOSIS — E039 Hypothyroidism, unspecified: Secondary | ICD-10-CM | POA: Diagnosis not present

## 2020-10-04 DIAGNOSIS — C50412 Malignant neoplasm of upper-outer quadrant of left female breast: Secondary | ICD-10-CM | POA: Insufficient documentation

## 2020-10-04 DIAGNOSIS — I4891 Unspecified atrial fibrillation: Secondary | ICD-10-CM | POA: Diagnosis not present

## 2020-10-04 DIAGNOSIS — Z8349 Family history of other endocrine, nutritional and metabolic diseases: Secondary | ICD-10-CM | POA: Diagnosis not present

## 2020-10-04 DIAGNOSIS — Z170 Estrogen receptor positive status [ER+]: Secondary | ICD-10-CM | POA: Diagnosis not present

## 2020-10-04 DIAGNOSIS — D638 Anemia in other chronic diseases classified elsewhere: Secondary | ICD-10-CM | POA: Diagnosis not present

## 2020-10-04 DIAGNOSIS — Z8261 Family history of arthritis: Secondary | ICD-10-CM | POA: Diagnosis not present

## 2020-10-04 DIAGNOSIS — Z87891 Personal history of nicotine dependence: Secondary | ICD-10-CM | POA: Insufficient documentation

## 2020-10-04 DIAGNOSIS — Z950 Presence of cardiac pacemaker: Secondary | ICD-10-CM | POA: Insufficient documentation

## 2020-10-04 HISTORY — PX: BREAST LUMPECTOMY WITH RADIOACTIVE SEED LOCALIZATION: SHX6424

## 2020-10-04 SURGERY — BREAST LUMPECTOMY WITH RADIOACTIVE SEED LOCALIZATION
Anesthesia: General | Site: Breast | Laterality: Left

## 2020-10-04 MED ORDER — ORAL CARE MOUTH RINSE: 15.0000 mL | Freq: Once | OROMUCOSAL | Status: AC

## 2020-10-04 MED ORDER — ONDANSETRON HCL 4 MG/2ML IJ SOLN
INTRAMUSCULAR | Status: DC | PRN
  Administered 2020-10-04: 4 mg via INTRAVENOUS

## 2020-10-04 MED ORDER — FENTANYL CITRATE (PF) 100 MCG/2ML IJ SOLN
INTRAMUSCULAR | Status: AC
  Filled 2020-10-04: qty 2

## 2020-10-04 MED ORDER — CELECOXIB 100 MG PO CAPS
100.0000 mg | ORAL_CAPSULE | ORAL | Status: AC
  Administered 2020-10-04: 100 mg via ORAL
  Filled 2020-10-04: qty 1

## 2020-10-04 MED ORDER — ONDANSETRON HCL 4 MG/2ML IJ SOLN: 4.0000 mg | Freq: Once | INTRAMUSCULAR | Status: DC | PRN

## 2020-10-04 MED ORDER — LACTATED RINGERS IV SOLN: INTRAVENOUS | Status: DC

## 2020-10-04 MED ORDER — CHLORHEXIDINE GLUCONATE CLOTH 2 % EX PADS: 6.0000 | MEDICATED_PAD | Freq: Once | CUTANEOUS | Status: DC

## 2020-10-04 MED ORDER — GABAPENTIN 100 MG PO CAPS
100.0000 mg | ORAL_CAPSULE | ORAL | Status: AC
  Administered 2020-10-04: 100 mg via ORAL
  Filled 2020-10-04: qty 1

## 2020-10-04 MED ORDER — FENTANYL CITRATE (PF) 100 MCG/2ML IJ SOLN
25.0000 ug | INTRAMUSCULAR | Status: DC | PRN
  Administered 2020-10-04 (×2): 25 ug via INTRAVENOUS

## 2020-10-04 MED ORDER — BUPIVACAINE-EPINEPHRINE (PF) 0.25% -1:200000 IJ SOLN
INTRAMUSCULAR | Status: AC
  Filled 2020-10-04: qty 30

## 2020-10-04 MED ORDER — FENTANYL CITRATE (PF) 250 MCG/5ML IJ SOLN
INTRAMUSCULAR | Status: DC | PRN
  Administered 2020-10-04 (×2): 50 ug via INTRAVENOUS

## 2020-10-04 MED ORDER — DEXAMETHASONE SODIUM PHOSPHATE 10 MG/ML IJ SOLN
INTRAMUSCULAR | Status: DC | PRN
  Administered 2020-10-04: 10 mg via INTRAVENOUS

## 2020-10-04 MED ORDER — LIDOCAINE 2% (20 MG/ML) 5 ML SYRINGE
INTRAMUSCULAR | Status: DC | PRN
  Administered 2020-10-04: 60 mg via INTRAVENOUS

## 2020-10-04 MED ORDER — PROPOFOL 500 MG/50ML IV EMUL
INTRAVENOUS | Status: DC | PRN
  Administered 2020-10-04: 100 ug/kg/min via INTRAVENOUS

## 2020-10-04 MED ORDER — PROPOFOL 10 MG/ML IV BOLUS
INTRAVENOUS | Status: DC | PRN
  Administered 2020-10-04: 80 mg via INTRAVENOUS
  Administered 2020-10-04: 50 mg via INTRAVENOUS
  Administered 2020-10-04: 20 mg via INTRAVENOUS
  Administered 2020-10-04: 50 mg via INTRAVENOUS

## 2020-10-04 MED ORDER — FENTANYL CITRATE (PF) 250 MCG/5ML IJ SOLN
INTRAMUSCULAR | Status: AC
  Filled 2020-10-04: qty 5

## 2020-10-04 MED ORDER — PROPOFOL 10 MG/ML IV BOLUS
INTRAVENOUS | Status: AC
  Filled 2020-10-04: qty 40

## 2020-10-04 MED ORDER — CHLORHEXIDINE GLUCONATE 0.12 % MT SOLN
15.0000 mL | Freq: Once | OROMUCOSAL | Status: AC
  Administered 2020-10-04: 15 mL via OROMUCOSAL
  Filled 2020-10-04: qty 15

## 2020-10-04 MED ORDER — ACETAMINOPHEN 500 MG PO TABS
1000.0000 mg | ORAL_TABLET | ORAL | Status: DC
  Filled 2020-10-04: qty 2

## 2020-10-04 MED ORDER — 0.9 % SODIUM CHLORIDE (POUR BTL) OPTIME
TOPICAL | Status: DC | PRN
  Administered 2020-10-04: 1000 mL

## 2020-10-04 MED ORDER — VANCOMYCIN HCL IN DEXTROSE 1-5 GM/200ML-% IV SOLN
1000.0000 mg | INTRAVENOUS | Status: AC
  Administered 2020-10-04: 1000 mg via INTRAVENOUS
  Filled 2020-10-04: qty 200

## 2020-10-04 MED ORDER — BUPIVACAINE-EPINEPHRINE 0.25% -1:200000 IJ SOLN
INTRAMUSCULAR | Status: DC | PRN
  Administered 2020-10-04: 20 mL

## 2020-10-04 MED ORDER — TRAMADOL HCL 50 MG PO TABS: 50.0000 mg | ORAL_TABLET | Freq: Four times a day (QID) | ORAL | 0 refills | Status: DC | PRN

## 2020-10-04 SURGICAL SUPPLY — 33 items
ADH SKN CLS APL DERMABOND .7 (GAUZE/BANDAGES/DRESSINGS) ×1
ADH SKN CLS LQ APL DERMABOND (GAUZE/BANDAGES/DRESSINGS) ×1
APL PRP STRL LF DISP 70% ISPRP (MISCELLANEOUS) ×1
BINDER BREAST LRG (GAUZE/BANDAGES/DRESSINGS) IMPLANT
BINDER BREAST XLRG (GAUZE/BANDAGES/DRESSINGS) IMPLANT
CANISTER SUCT 3000ML PPV (MISCELLANEOUS) ×3 IMPLANT
CHLORAPREP W/TINT 26 (MISCELLANEOUS) ×3 IMPLANT
COVER PROBE W GEL 5X96 (DRAPES) ×3 IMPLANT
COVER SURGICAL LIGHT HANDLE (MISCELLANEOUS) ×3 IMPLANT
DERMABOND ADHESIVE PROPEN (GAUZE/BANDAGES/DRESSINGS) ×2
DERMABOND ADVANCED (GAUZE/BANDAGES/DRESSINGS) ×2
DERMABOND ADVANCED .7 DNX12 (GAUZE/BANDAGES/DRESSINGS) ×1 IMPLANT
DERMABOND ADVANCED .7 DNX6 (GAUZE/BANDAGES/DRESSINGS) IMPLANT
DEVICE DUBIN SPECIMEN MAMMOGRA (MISCELLANEOUS) ×3 IMPLANT
DRAPE CHEST BREAST 15X10 FENES (DRAPES) ×3 IMPLANT
ELECT COATED BLADE 2.86 ST (ELECTRODE) ×3 IMPLANT
ELECT REM PT RETURN 9FT ADLT (ELECTROSURGICAL) ×3
ELECTRODE REM PT RTRN 9FT ADLT (ELECTROSURGICAL) ×1 IMPLANT
GLOVE SURG ENC MOIS LTX SZ7.5 (GLOVE) ×6 IMPLANT
GOWN STRL REUS W/ TWL LRG LVL3 (GOWN DISPOSABLE) ×2 IMPLANT
GOWN STRL REUS W/TWL LRG LVL3 (GOWN DISPOSABLE) ×6
KIT BASIN OR (CUSTOM PROCEDURE TRAY) ×3 IMPLANT
KIT MARKER MARGIN INK (KITS) ×3 IMPLANT
NDL HYPO 25GX1X1/2 BEV (NEEDLE) ×1 IMPLANT
NEEDLE HYPO 25GX1X1/2 BEV (NEEDLE) ×3 IMPLANT
NS IRRIG 1000ML POUR BTL (IV SOLUTION) ×3 IMPLANT
PACK GENERAL/GYN (CUSTOM PROCEDURE TRAY) ×3 IMPLANT
SUT MNCRL AB 4-0 PS2 18 (SUTURE) ×3 IMPLANT
SUT SILK 2 0 SH (SUTURE) IMPLANT
SUT VIC AB 3-0 SH 18 (SUTURE) ×3 IMPLANT
SYR CONTROL 10ML LL (SYRINGE) ×3 IMPLANT
TOWEL GREEN STERILE (TOWEL DISPOSABLE) ×3 IMPLANT
TOWEL GREEN STERILE FF (TOWEL DISPOSABLE) ×3 IMPLANT

## 2020-10-04 NOTE — Anesthesia Postprocedure Evaluation (Signed)
Anesthesia Post Note  Patient: [name] [name]  Procedure(s) Performed: LEFT BREAST LUMPECTOMY WITH RADIOACTIVE SEED LOCALIZATION (Left: Breast)     Patient location during evaluation: PACU Anesthesia Type: General Level of consciousness: awake and alert Pain management: pain level controlled Vital Signs Assessment: post-procedure vital signs reviewed and stable Respiratory status: spontaneous breathing, nonlabored ventilation, respiratory function stable and patient connected to nasal cannula oxygen Cardiovascular status: blood pressure returned to baseline and stable Postop Assessment: no apparent nausea or vomiting Anesthetic complications: no   No notable events documented.  Last Vitals:  Vitals:   10/04/20 0839 10/04/20 0850  BP:  (!) 163/68  Pulse:  74  Resp:    Temp:    SpO2: 100% 100%    Last Pain:  Vitals:   10/04/20 0835  TempSrc:   PainSc: 0-No pain                 Catalina Gravel

## 2020-10-04 NOTE — Anesthesia Procedure Notes (Signed)
Procedure Name: LMA Insertion Date/Time: 10/04/2020 7:42 AM Performed by: Reece Agar, CRNA Pre-anesthesia Checklist: Patient identified, Emergency Drugs available, Suction available and Patient being monitored Patient Re-evaluated:Patient Re-evaluated prior to induction Oxygen Delivery Method: Circle System Utilized Preoxygenation: Pre-oxygenation with 100% oxygen Induction Type: IV induction Ventilation: Mask ventilation without difficulty LMA: LMA inserted LMA Size: 4.0 Number of attempts: 1 Airway Equipment and Method: Bite block Placement Confirmation: positive ETCO2 Tube secured with: Tape Dental Injury: Teeth and Oropharynx as per pre-operative assessment

## 2020-10-04 NOTE — Progress Notes (Signed)
0700 medtronic rep page.  0705 medtronic rep returned call, informed of device clinic recommendations and Dr. Gifford Shave wanted them here. Stated someone would arrive in 15-20 mins.

## 2020-10-04 NOTE — Op Note (Signed)
10/04/2020  8:23 AM  PATIENT:  [name] [name]  87 y.o. female  PRE-OPERATIVE DIAGNOSIS:  LEFT BREAST CANCER  POST-OPERATIVE DIAGNOSIS:  LEFT BREAST CANCER  PROCEDURE:  Procedure(s): LEFT BREAST LUMPECTOMY WITH RADIOACTIVE SEED LOCALIZATION (Left)  SURGEON:  Surgeon(s) and Role:    * Jovita Kussmaul, MD - Primary  PHYSICIAN ASSISTANT:   ASSISTANTS: none   ANESTHESIA:   local and general  EBL:  minimal   BLOOD ADMINISTERED:none  DRAINS: none   LOCAL MEDICATIONS USED:  MARCAINE     SPECIMEN:  Source of Specimen:  left breast tissue  DISPOSITION OF SPECIMEN:  PATHOLOGY  COUNTS:  YES  TOURNIQUET:  * No tourniquets in log *  DICTATION: .Dragon Dictation  After informed consent was obtained the patient was brought to the operating room and placed in the supine position on the operating table.  After adequate induction of general anesthesia the patient's left breast was prepped with ChloraPrep, allowed to dry, and draped in usual sterile manner.  An appropriate timeout was performed.  Previously an I-125 seed was placed in the upper portion of the left breast to mark an area of invasive breast cancer.  The neoprobe was set to I-125 in the area of radioactivity was readily identified.  I elected to make an elliptical incision in the skin overlying the area of radioactivity with a 15 blade knife.  The incision was carried through the skin and subcutaneous tissue sharply with the electrocautery.  A circular portion of breast tissue was then excised sharply with the electrocautery around the radioactive seed while checking the area of radioactivity frequently.  This dissection was carried all the way to the muscle of the chest wall.  Once the dissection was complete the specimen was removed from the patient.  It was oriented with the appropriate paint colors.  A specimen radiograph was obtained that showed the clip and seed to be near the center of the specimen.  The specimen was then sent  to pathology for further evaluation.  Hemostasis was achieved using the Bovie electrocautery.  The wound was irrigated with saline and infiltrated with more quarter percent Marcaine.  The deep layer of the wound was then closed with layers of interrupted 3-0 Vicryl stitches.  The skin was closed with a running 4-0 Monocryl subcuticular stitch.  Dermabond dressings were applied.  The patient tolerated the procedure well.  At the end of the case all needle sponge and instrument counts were correct.  The patient was then awakened and taken to recovery in stable condition.  PLAN OF CARE: Discharge to home after PACU  PATIENT DISPOSITION:  PACU - hemodynamically stable.   Delay start of Pharmacological VTE agent (>24hrs) due to surgical blood loss or risk of bleeding: not applicable

## 2020-10-04 NOTE — Transfer of Care (Signed)
Immediate Anesthesia Transfer of Care Note  Patient: [name] [name]  Procedure(s) Performed: LEFT BREAST LUMPECTOMY WITH RADIOACTIVE SEED LOCALIZATION (Left: Breast)  Patient Location: PACU  Anesthesia Type:General  Level of Consciousness: awake and alert   Airway & Oxygen Therapy: Patient Spontanous Breathing and Patient connected to face mask oxygen  Post-op Assessment: Report given to RN and Post -op Vital signs reviewed and stable  Post vital signs: Reviewed and stable  Last Vitals:  Vitals Value Taken Time  BP 164/60 10/04/20 0835  Temp    Pulse 74 10/04/20 0840  Resp 24 10/04/20 0840  SpO2 98 % 10/04/20 0840  Vitals shown include unvalidated device data.  Last Pain:  Vitals:   10/04/20 0645  TempSrc:   PainSc: 0-No pain         Complications: No notable events documented.

## 2020-10-04 NOTE — H&P (Signed)
[name] [name]  Location: [facility] Surgery Patient #: [id number] DOB: 1929/10/27 Undefined / Language: Cleophus Molt / Race: White Female   History of Present Illness  The patient is a 87 year old female who presents with breast cancer.We are asked to see the patient in consultation by Dr. Lindi Adie to evaluate her for a new left breast cancer. The patient is a 87 year old white female who presents with a screen detected mass in the upper right breast measuring 48m. The axilla looked neg. The mass was biopsied and came back as a grade 1 IDC that was ER and PR + and Her2 - with a Ki67 of 5%. She does have a significant cardiac history and has a pacemaker and is on Eliquis. She is followed by Dr. ARayann Heman   Past Surgical History  Cataract Surgery   Bilateral. Hip Surgery   Left. Hysterectomy (not due to cancer) - Complete   Valve Replacement    Diagnostic Studies History  Colonoscopy   5-10 years ago Mammogram   within last year Pap Smear   1-5 years ago  Medication History  Medications Reconciled   Social History  Caffeine use   Coffee. No drug use   Tobacco use   Former smoker.  Family History  Alcohol Abuse   Father. Arthritis   Father, Mother. Heart Disease   Family Members In General. Heart disease in female family member before age 85  Migraine Headache   Sister. Thyroid problems   Sister.  Pregnancy / Birth History  Gravida   0 Para   0  Other Problems  Atrial Fibrillation   High blood pressure   Hypercholesterolemia   Melanoma   Oophorectomy   Bilateral. Thyroid Disease   Transfusion history      Review of Systems  General Not Present- Appetite Loss, Chills, Fatigue, Fever, Night Sweats, Weight Gain and Weight Loss. Skin Present- Dryness. Not Present- Change in Wart/Mole, Hives, Jaundice, New Lesions, Non-Healing Wounds, Rash and Ulcer. HEENT Present- Seasonal Allergies and Sinus Pain. Not Present- Earache, Hearing Loss, Hoarseness, Nose Bleed, Oral Ulcers,  Ringing in the Ears, Sore Throat, Visual Disturbances, Wears glasses/contact lenses and Yellow Eyes. Respiratory Not Present- Bloody sputum, Chronic Cough, Difficulty Breathing, Snoring and Wheezing. Breast Present- Breast Mass. Not Present- Breast Pain, Nipple Discharge and Skin Changes. Cardiovascular Present- Palpitations. Not Present- Chest Pain, Difficulty Breathing Lying Down, Leg Cramps, Rapid Heart Rate, Shortness of Breath and Swelling of Extremities. Gastrointestinal Not Present- Abdominal Pain, Bloating, Bloody Stool, Change in Bowel Habits, Chronic diarrhea, Constipation, Difficulty Swallowing, Excessive gas, Gets full quickly at meals, Hemorrhoids, Indigestion, Nausea, Rectal Pain and Vomiting. Female Genitourinary Present- Nocturia. Not Present- Frequency, Painful Urination, Pelvic Pain and Urgency. Musculoskeletal Present- Back Pain. Not Present- Joint Pain, Joint Stiffness, Muscle Pain, Muscle Weakness and Swelling of Extremities. Neurological Not Present- Decreased Memory, Fainting, Headaches, Numbness, Seizures, Tingling, Tremor, Trouble walking and Weakness. Psychiatric Not Present- Anxiety, Bipolar, Change in Sleep Pattern, Depression, Fearful and Frequent crying. Endocrine Present- Cold Intolerance. Not Present- Excessive Hunger, Hair Changes, Heat Intolerance, Hot flashes and New Diabetes. Hematology Present- Blood Thinners and Easy Bruising. Not Present- Excessive bleeding, Gland problems, HIV and Persistent Infections.   Physical Exam  General Mental Status - Alert. General Appearance - Consistent with stated age. Hydration - Well hydrated. Voice - Normal.  Head and Neck Head - normocephalic, atraumatic with no lesions or palpable masses. Trachea - midline. Thyroid Gland Characteristics - normal size and consistency.  Eye Eyeball -  Bilateral - Extraocular movements intact. Sclera/Conjunctiva - Bilateral - No scleral icterus.  Chest and Lung Exam Chest and lung  exam reveals  - quiet, even and easy respiratory effort with no use of accessory muscles and on auscultation, normal breath sounds, no adventitious sounds and normal vocal resonance. Inspection Chest Wall - Normal. Back - normal.  Breast Note:  There is significant bruising of the left breast but it is soft with no palpable mass. there is no palpable axillary, supraclavicular, or cervical lymphadenopathy. There is a pacemaker on left chest wall and on right as well   Cardiovascular Cardiovascular examination reveals  - normal heart sounds, regular rate and rhythm with no murmurs and normal pedal pulses bilaterally.  Abdomen Inspection Inspection of the abdomen reveals - No Hernias. Skin - Scar - no surgical scars. Palpation/Percussion Palpation and Percussion of the abdomen reveal - Soft, Non Tender, No Rebound tenderness, No Rigidity (guarding) and No hepatosplenomegaly. Auscultation Auscultation of the abdomen reveals - Bowel sounds normal.  Neurologic Neurologic evaluation reveals  - alert and oriented x 3 with no impairment of recent or remote memory. Mental Status - Normal.  Musculoskeletal Normal Exam - Left - Upper Extremity Strength Normal and Lower Extremity Strength Normal. Normal Exam - Right - Upper Extremity Strength Normal and Lower Extremity Strength Normal.  Lymphatic Head & Neck  General Head & Neck Lymphatics: Bilateral - Description - Normal. Axillary  General Axillary Region: Bilateral - Description - Normal. Tenderness - Non Tender. Femoral & Inguinal  Generalized Femoral & Inguinal Lymphatics: Bilateral - Description - Normal. Tenderness - Non Tender.    Assessment & Plan  MALIGNANT NEOPLASM OF UPPER-OUTER QUADRANT OF LEFT BREAST IN FEMALE, ESTROGEN RECEPTOR POSITIVE (C50.412) Impression: The patient appears to have a small stage 1 cancer in the UOQ of the left breast with clinically neg nodes. I have discussed with her the different optiions for  surgical treatment and at this point she favors breast conservation which I feel is appropriate. She will not need a node evaluation. I have discussed with her in detail the risks and benefits of the surgery as well as some of the technical aspects including the use of a radioactive seed and she understands and wishes to proceed. She will need cardiac clearance prior to scheduling. This patient encounter took 60 minutes today to perform the following: take history, perform exam, review outside records, interpret imaging, counsel the patient on their diagnosis and document encounter, findings & plan in the EHR Current Plans Referred to Oncology, for evaluation and follow up (Oncology). Routine.

## 2020-10-04 NOTE — Telephone Encounter (Signed)
Successful telephone encounter to patient per industry request to follow up on s/s of asynchronous pacing. Patient states she feels fine post lumpectomy today. Appointment made for tomorrow 10/05/20 at 0920 to reprogram. Industry aware and will be present. Patient very appreciative of call.

## 2020-10-04 NOTE — Interval H&P Note (Signed)
History and Physical Interval Note:  10/04/2020 7:12 AM  [name] [name]  has presented today for surgery, with the diagnosis of LEFT BREAST CANCER.  The various methods of treatment have been discussed with the patient and family. After consideration of risks, benefits and other options for treatment, the patient has consented to  Procedure(s): LEFT BREAST LUMPECTOMY WITH RADIOACTIVE SEED LOCALIZATION (Left) as a surgical intervention.  The patient's history has been reviewed, patient examined, no change in status, stable for surgery.  I have reviewed the patient's chart and labs.  Questions were answered to the patient's satisfaction.     Autumn Messing III

## 2020-10-05 ENCOUNTER — Encounter (HOSPITAL_COMMUNITY): Payer: Self-pay | Admitting: General Surgery

## 2020-10-05 ENCOUNTER — Ambulatory Visit: Payer: Medicare PPO

## 2020-10-05 DIAGNOSIS — I495 Sick sinus syndrome: Secondary | ICD-10-CM

## 2020-10-05 LAB — CUP PACEART INCLINIC DEVICE CHECK
Battery Impedance: 259 Ohm
Battery Remaining Longevity: 86 mo
Battery Voltage: 2.77 V
Brady Statistic AP VP Percent: 100 %
Brady Statistic AP VS Percent: 0 %
Brady Statistic AS VP Percent: 0 %
Brady Statistic AS VS Percent: 0 %
Date Time Interrogation Session: 20220803144131
Implantable Lead Implant Date: 20180807
Implantable Lead Implant Date: 20180807
Implantable Lead Location: 753859
Implantable Lead Location: 753860
Implantable Lead Model: 5076
Implantable Lead Model: 5076
Implantable Pulse Generator Implant Date: 20180807
Lead Channel Impedance Value: 334 Ohm
Lead Channel Impedance Value: 503 Ohm
Lead Channel Pacing Threshold Amplitude: 0.5 V
Lead Channel Pacing Threshold Amplitude: 0.75 V
Lead Channel Pacing Threshold Pulse Width: 0.4 ms
Lead Channel Pacing Threshold Pulse Width: 0.4 ms
Lead Channel Sensing Intrinsic Amplitude: 1.4 mV
Lead Channel Sensing Intrinsic Amplitude: 22.4 mV
Lead Channel Setting Pacing Amplitude: 2 V
Lead Channel Setting Pacing Amplitude: 2.5 V
Lead Channel Setting Pacing Pulse Width: 0.4 ms
Lead Channel Setting Sensing Sensitivity: 5.6 mV

## 2020-10-05 NOTE — Progress Notes (Signed)
Device checked and reprogrammed by industry secondary to leaving hospital in asynchronous mode 10/04/20. PEP

## 2020-10-06 LAB — SURGICAL PATHOLOGY

## 2020-10-10 ENCOUNTER — Encounter: Payer: Self-pay | Admitting: Cardiology

## 2020-10-10 ENCOUNTER — Encounter: Payer: Self-pay | Admitting: *Deleted

## 2020-10-10 ENCOUNTER — Other Ambulatory Visit: Payer: Self-pay

## 2020-10-10 ENCOUNTER — Ambulatory Visit: Payer: Medicare PPO | Admitting: Cardiology

## 2020-10-10 VITALS — BP 120/70 | HR 67 | Ht 66.0 in | Wt 125.6 lb

## 2020-10-10 DIAGNOSIS — Z952 Presence of prosthetic heart valve: Secondary | ICD-10-CM | POA: Diagnosis not present

## 2020-10-10 DIAGNOSIS — I2510 Atherosclerotic heart disease of native coronary artery without angina pectoris: Secondary | ICD-10-CM | POA: Diagnosis not present

## 2020-10-10 DIAGNOSIS — I4819 Other persistent atrial fibrillation: Secondary | ICD-10-CM | POA: Diagnosis not present

## 2020-10-10 DIAGNOSIS — I495 Sick sinus syndrome: Secondary | ICD-10-CM

## 2020-10-10 DIAGNOSIS — I10 Essential (primary) hypertension: Secondary | ICD-10-CM

## 2020-10-10 DIAGNOSIS — I5032 Chronic diastolic (congestive) heart failure: Secondary | ICD-10-CM | POA: Diagnosis not present

## 2020-10-12 NOTE — Progress Notes (Signed)
Patient Care Team: [name], MD as PCP - General (Internal Medicine) Martinique, Peter M, MD as PCP - Cardiology (Cardiology) Thompson Grayer, MD as PCP - Electrophysiology (Cardiology) Rockwell Germany, RN as Oncology Nurse Navigator Mauro Kaufmann, RN as Oncology Nurse Navigator Gery Pray, MD as Consulting Physician (Radiation Oncology) Nicholas Lose, MD as Consulting Physician (Hematology and Oncology) Jovita Kussmaul, MD as Consulting Physician (General Surgery)  DIAGNOSIS:    ICD-10-CM   1. Malignant neoplasm of upper-outer quadrant of left breast in female, estrogen receptor positive ([place])  C50.412    Z17.0       SUMMARY OF ONCOLOGIC HISTORY: Oncology History  Malignant neoplasm of upper-outer quadrant of left breast in female, estrogen receptor positive ([place])  08/26/2020 Initial Diagnosis   Screening detected left breast mass 0.8 cm, axilla negative, biopsy revealed grade 1 IDC ER 95%, PR 1%, HER2 equivocal by IHC, FISH negative, Ki-67 5%   08/31/2020 Cancer Staging   Staging form: Breast, AJCC 8th Edition - Clinical stage from 08/31/2020: Stage IA (cT1b, cN0, cM0, G1, ER+, PR+, HER2-) - Signed by Nicholas Lose, MD on 08/31/2020 Stage prefix: Initial diagnosis Histologic grading system: 3 grade system   10/04/2020 Surgery   Left lumpectomy (Dr. Jovita Kussmaul): 0.9 cm, grade 1 invasive ductal carcinoma with resection margins negative for carcinoma     CHIEF COMPLIANT: Follow-up of left breast cancer  INTERVAL HISTORY: [name] [name] is a 87 y.o. with above-mentioned history of left breast cancer. She underwent left lumpectomy on 10/04/20 with Dr. Jovita Kussmaul for which the pathology showed 0.9 cm, grade 1 invasive ductal carcinoma with resection margins negative for carcinoma. She reports to the clinic to day for follow-up and to discuss the pathology report.  She is healing and recovering very well from recent surgery.  ALLERGIES:  is allergic to penicillins, tetanus  toxoid, demerol, clindamycin/lincomycin, and codeine.  MEDICATIONS:  Current Outpatient Medications  Medication Sig Dispense Refill   amiodarone (PACERONE) 200 MG tablet TAKE 1/2 TABLET(100 MG) BY MOUTH DAILY (Patient taking differently: Take 100 mg by mouth daily.) 45 tablet 3   amLODipine (NORVASC) 5 MG tablet TAKE 1 TABLET(5 MG) BY MOUTH DAILY (Patient taking differently: Take 5 mg by mouth daily.) 90 tablet 3   calcium carbonate (OS-CAL) 600 MG TABS Take 600 mg by mouth 2 (two) times daily with a meal.     ELIQUIS 2.5 MG TABS tablet TAKE 1 TABLET(2.5 MG) BY MOUTH TWICE DAILY (Patient taking differently: Take 2.5 mg by mouth 2 (two) times daily.) 180 tablet 1   ezetimibe (ZETIA) 10 MG tablet TAKE 1 TABLET(10 MG) BY MOUTH DAILY (Patient taking differently: Take 10 mg by mouth daily.) 90 tablet 3   feeding supplement, ENSURE ENLIVE, (ENSURE ENLIVE) LIQD Take 237 mLs by mouth 2 (two) times daily between meals. (Patient taking differently: Take 237 mLs by mouth daily.) 237 mL 12   fluticasone (FLONASE) 50 MCG/ACT nasal spray Place 2 sprays into both nostrils daily as needed for allergies (congestion).     furosemide (LASIX) 20 MG tablet TAKE 1 TABLET BY MOUTH EVERY DAY; EXCEPT ON MONDAY AND FRIDAY (Patient taking differently: Take 20 mg by mouth every other day.) 90 tablet 3   losartan (COZAAR) 25 MG tablet Take 1 tablet (25 mg total) by mouth daily. 90 tablet 1   metoprolol tartrate (LOPRESSOR) 25 MG tablet TAKE 1 AND 1/2 TABLETS(37.5 MG) BY MOUTH TWICE DAILY (Patient taking differently: Take 37.5 mg by mouth  2 (two) times daily.) 270 tablet 2   Multiple Vitamins-Minerals (PRESERVISION AREDS 2) CAPS Take 1 capsule by mouth 2 (two) times daily.     nitroGLYCERIN (NITROSTAT) 0.4 MG SL tablet Place 1 tablet (0.4 mg total) under the tongue every 5 (five) minutes as needed for chest pain. 25 tablet 12   pantoprazole (PROTONIX) 40 MG tablet Take 1 tablet (40 mg total) by mouth 2 (two) times daily before a  meal. (Patient taking differently: Take 40 mg by mouth daily.) 180 tablet 3   polyvinyl alcohol (LIQUIFILM TEARS) 1.4 % ophthalmic solution Place 1-2 drops into both eyes 3 (three) times daily as needed for dry eyes.     SYNTHROID 125 MCG tablet Take 125 mcg by mouth daily before breakfast.      traMADol (ULTRAM) 50 MG tablet Take 1 tablet (50 mg total) by mouth every 6 (six) hours as needed. 15 tablet 0   No current facility-administered medications for this visit.    PHYSICAL EXAMINATION: ECOG PERFORMANCE STATUS: 1 - Symptomatic but completely ambulatory  Vitals:   10/13/20 0904  BP: (!) 158/70  Pulse: 85  Resp: 18  Temp: 97.8 F (36.6 C)  SpO2: 100%   Filed Weights   10/13/20 0904  Weight: 127 lb 9.6 oz (57.9 kg)    BREAST: Surgical scar on the left breast appears to be healing very well.. (exam performed in the presence of a chaperone)  LABORATORY DATA:  I have reviewed the data as listed CMP Latest Ref Rng & Units 09/27/2020 08/31/2020 06/07/2020  Glucose 70 - 99 mg/dL 98 96 95  BUN 8 - 23 mg/dL 33(H) 39(H) 34(H)  Creatinine 0.44 - 1.00 mg/dL 1.25(H) 1.27(H) 1.46(H)  Sodium 135 - 145 mmol/L 134(L) 139 142  Potassium 3.5 - 5.1 mmol/L 4.6 4.6 5.1  Chloride 98 - 111 mmol/L 98 100 99  CO2 22 - 32 mmol/L _0 Calcium 8.9 - 10.3 mg/dL 9.8 9.8 9.9  Total Protein 6.5 - 8.1 g/dL - 6.5 7.1  Total Bilirubin 0.3 - 1.2 mg/dL - 1.2 0.7  Alkaline Phos 38 - 126 U/L - 66 83  AST 15 - 41 U/L - 23 28  ALT 0 - 44 U/L - 12 14    Lab Results  Component Value Date   WBC 6.6 09/27/2020   HGB 13.7 09/27/2020   HCT 42.0 09/27/2020   MCV 106.6 (H) 09/27/2020   PLT 179 09/27/2020   NEUTROABS 4.2 08/31/2020    ASSESSMENT & PLAN:  Malignant neoplasm of upper-outer quadrant of left breast in female, estrogen receptor positive ([place]) June 2022:Screening detected left breast mass 0.8 cm, axilla negative, biopsy revealed grade 1 IDC ER 95%, PR 1%, HER2 equivocal by IHC, FISH negative,  Ki-67 5%   10/04/2020:Left lumpectomy (Dr. Autumn Messing): 0.9 cm, grade 1 invasive ductal carcinoma with resection margins negative for carcinoma  Treatment plan: 1.  Given the favorable type of breast cancer and the size and negative margins, we decided not to pursue radiation. 2.  Adjuvant antiestrogen therapy with letrozole 2.5 mg daily x5 years    Letrozole counseling: We discussed the risks and benefits of anti-estrogen therapy with aromatase inhibitors. These include but not limited to insomnia, hot flashes, mood changes, vaginal dryness, bone density loss, and weight gain. We strongly believe that the benefits far outweigh the risks. Patient understands these risks and consented to starting treatment. Planned treatment duration is 5 years.  Return to clinic in 3 months for  survivorship care plan visit follow-up    No orders of the defined types were placed in this encounter.  The patient has a good understanding of the overall plan. she agrees with it. she will call with any problems that may develop before the next visit here.  Total time spent: 20 mins including face to face time and time spent for planning, charting and coordination of care  Rulon Eisenmenger, MD, MPH 10/13/2020  I, Thana Ates, am acting as scribe for Dr. Nicholas Lose.  I have reviewed the above documentation for accuracy and completeness, and I agree with the above.

## 2020-10-13 ENCOUNTER — Encounter: Payer: Self-pay | Admitting: *Deleted

## 2020-10-13 ENCOUNTER — Inpatient Hospital Stay: Payer: Medicare PPO | Attending: Hematology and Oncology | Admitting: Hematology and Oncology

## 2020-10-13 ENCOUNTER — Other Ambulatory Visit: Payer: Self-pay

## 2020-10-13 DIAGNOSIS — Z170 Estrogen receptor positive status [ER+]: Secondary | ICD-10-CM | POA: Insufficient documentation

## 2020-10-13 DIAGNOSIS — Z79811 Long term (current) use of aromatase inhibitors: Secondary | ICD-10-CM | POA: Diagnosis not present

## 2020-10-13 DIAGNOSIS — Z7901 Long term (current) use of anticoagulants: Secondary | ICD-10-CM | POA: Insufficient documentation

## 2020-10-13 DIAGNOSIS — C50412 Malignant neoplasm of upper-outer quadrant of left female breast: Secondary | ICD-10-CM | POA: Insufficient documentation

## 2020-10-13 DIAGNOSIS — Z79899 Other long term (current) drug therapy: Secondary | ICD-10-CM | POA: Insufficient documentation

## 2020-10-13 MED ORDER — LETROZOLE 2.5 MG PO TABS: 2.5000 mg | ORAL_TABLET | Freq: Every day | ORAL | 3 refills | Status: DC

## 2020-10-13 NOTE — Assessment & Plan Note (Signed)
June 2022:Screening detected left breast mass 0.8 cm, axilla negative, biopsy revealed grade 1 IDC ER 95%, PR 1%, HER2 equivocal by IHC, FISH negative, Ki-67 5%  10/04/2020:Left lumpectomy (Dr. Autumn Messing): 0.9 cm, grade 1 invasive ductal carcinoma with resection margins negative for carcinoma  Treatment plan: 1.  +/- XRT (most likely no radiation) 2.  Adjuvant antiestrogen therapy Genetics consult  Letrozole counseling: We discussed the risks and benefits of anti-estrogen therapy with aromatase inhibitors. These include but not limited to insomnia, hot flashes, mood changes, vaginal dryness, bone density loss, and weight gain. We strongly believe that the benefits far outweigh the risks. Patient understands these risks and consented to starting treatment. Planned treatment duration is 5 years.  Return to clinic in 3 months for survivorship care plan visit follow-up

## 2020-10-25 DIAGNOSIS — E039 Hypothyroidism, unspecified: Secondary | ICD-10-CM | POA: Diagnosis not present

## 2020-10-25 DIAGNOSIS — E785 Hyperlipidemia, unspecified: Secondary | ICD-10-CM | POA: Diagnosis not present

## 2020-10-25 DIAGNOSIS — C50919 Malignant neoplasm of unspecified site of unspecified female breast: Secondary | ICD-10-CM | POA: Diagnosis not present

## 2020-10-25 DIAGNOSIS — I4891 Unspecified atrial fibrillation: Secondary | ICD-10-CM | POA: Diagnosis not present

## 2020-10-25 DIAGNOSIS — D84821 Immunodeficiency due to drugs: Secondary | ICD-10-CM | POA: Diagnosis not present

## 2020-10-25 DIAGNOSIS — D8481 Immunodeficiency due to conditions classified elsewhere: Secondary | ICD-10-CM | POA: Diagnosis not present

## 2020-10-25 DIAGNOSIS — D6869 Other thrombophilia: Secondary | ICD-10-CM | POA: Diagnosis not present

## 2020-10-25 DIAGNOSIS — I10 Essential (primary) hypertension: Secondary | ICD-10-CM | POA: Diagnosis not present

## 2020-10-25 DIAGNOSIS — I2510 Atherosclerotic heart disease of native coronary artery without angina pectoris: Secondary | ICD-10-CM | POA: Diagnosis not present

## 2020-10-31 ENCOUNTER — Ambulatory Visit: Payer: Medicare PPO | Admitting: Physical Therapy

## 2020-11-03 ENCOUNTER — Telehealth: Payer: Self-pay | Admitting: Genetic Counselor

## 2020-11-03 ENCOUNTER — Ambulatory Visit: Payer: Self-pay | Admitting: Genetic Counselor

## 2020-11-03 ENCOUNTER — Encounter: Payer: Self-pay | Admitting: Genetic Counselor

## 2020-11-03 DIAGNOSIS — Z803 Family history of malignant neoplasm of breast: Secondary | ICD-10-CM

## 2020-11-03 DIAGNOSIS — Z85828 Personal history of other malignant neoplasm of skin: Secondary | ICD-10-CM

## 2020-11-03 DIAGNOSIS — Z1379 Encounter for other screening for genetic and chromosomal anomalies: Secondary | ICD-10-CM | POA: Insufficient documentation

## 2020-11-03 DIAGNOSIS — Z170 Estrogen receptor positive status [ER+]: Secondary | ICD-10-CM

## 2020-11-03 DIAGNOSIS — C50412 Malignant neoplasm of upper-outer quadrant of left female breast: Secondary | ICD-10-CM

## 2020-11-03 NOTE — Telephone Encounter (Signed)
Revealed negative genetic testing.  Discussed that we do not know why she has breast cancer or why there is cancer in the family. It could be sproadic/familial, due to a different gene that we are not testing, or maybe our current technology may not be able to pick something up.  It will be important for her to keep in contact with genetics to keep up with whether additional testing may be needed.

## 2020-11-03 NOTE — Progress Notes (Signed)
HPI:  Ms. Luellen was previously seen in the [place] clinic due to a personal and family history of cancer and concerns regarding a hereditary predisposition to cancer. Please refer to our prior cancer genetics clinic note for more information regarding our discussion, assessment and recommendations, at the time. Ms. Struthers recent genetic test results were disclosed to her, as were recommendations warranted by these results. These results and recommendations are discussed in more detail below.  CANCER HISTORY:  Oncology History  Malignant neoplasm of upper-outer quadrant of left breast in female, estrogen receptor positive ([place])  08/26/2020 Initial Diagnosis   Screening detected left breast mass 0.8 cm, axilla negative, biopsy revealed grade 1 IDC ER 95%, PR 1%, HER2 equivocal by IHC, FISH negative, Ki-67 5%   08/31/2020 Cancer Staging   Staging form: Breast, AJCC 8th Edition - Clinical stage from 08/31/2020: Stage IA (cT1b, cN0, cM0, G1, ER+, PR+, HER2-) - Signed by Nicholas Lose, MD on 08/31/2020 Stage prefix: Initial diagnosis Histologic grading system: 3 grade system   10/04/2020 Surgery   Left lumpectomy (Dr. Jovita Kussmaul): 0.9 cm, grade 1 invasive ductal carcinoma with resection margins negative for carcinoma     FAMILY HISTORY:  We obtained a detailed, 4-generation family history.  Significant diagnoses are listed below: Family History  Problem Relation Age of Onset   Kidney cancer Father        dx 96s   Breast cancer Maternal Aunt 37   Breast cancer Cousin        maternal cousin, dx >50    Ms. Vinal is unaware of previous family history of genetic testing for hereditary cancer risks. There is no reported Ashkenazi Jewish ancestry   GENETIC TEST RESULTS: Genetic testing reported out on September 13, 2020. The [employer] CustomNext-Cancer +RNAinsight Panel found no pathogenic mutations. The CustomNext-Cancer+RNAinsight panel offered by [facility] includes  sequencing and rearrangement analysis for the following 47 genes:  APC, ATM, AXIN2, BARD1, BMPR1A, BRCA1, BRCA2, BRIP1, CDH1, CDK4, CDKN2A, CHEK2, DICER1, EPCAM, GREM1, HOXB13, MEN1, MLH1, MSH2, MSH3, MSH6, MUTYH, NBN, NF1, NF2, NTHL1, PALB2, PMS2, POLD1, POLE, PTEN, RAD51C, RAD51D, RECQL, RET, SDHA, SDHAF2, SDHB, SDHC, SDHD, SMAD4, SMARCA4, STK11, TP53, TSC1, TSC2, and VHL.  RNA data is routinely analyzed for use in variant interpretation for all genes.   The test report has been scanned into EPIC and is located under the Molecular Pathology section of the Results Review tab.  A portion of the result report is included below for reference.    We discussed with Ms. Hassan that because current genetic testing is not perfect, it is possible there may be a gene mutation in one of these genes that current testing cannot detect, but that chance is small.  We also discussed, that there could be another gene that has not yet been discovered, or that we have not yet tested, that is responsible for the cancer diagnoses in the family. It is also possible there is a hereditary cause for the cancer in the family that Ms. Grand did not inherit and therefore was not identified in her testing.  Therefore, it is important to remain in touch with cancer genetics in the future so that we can continue to offer Ms. Frady the most up to date genetic testing.   ADDITIONAL GENETIC TESTING: We discussed with Ms. Pitstick that there are other genes that are associated with increased cancer risk that can be analyzed. Should Ms. Coglianese wish to pursue additional genetic testing, we are  happy to discuss and coordinate this testing, at any time.     CANCER SCREENING RECOMMENDATIONS: Ms. Welge test result is considered negative (normal).  This means that we have not identified a hereditary cause for her personal history of cancer at this time. Most cancers happen by chance and this negative test suggests that her cancer may  fall into this category.    While reassuring, this does not definitively rule out a hereditary predisposition to cancer. It is still possible that there could be genetic mutations that are undetectable by current technology. There could be genetic mutations in genes that have not been tested or identified to increase cancer risk.  Therefore, it is recommended she continue to follow the cancer management and screening guidelines provided by her oncology and primary healthcare provider.   An individual's cancer risk and medical management are not determined by genetic test results alone. Overall cancer risk assessment incorporates additional factors, including personal medical history, family history, and any available genetic information that may result in a personalized plan for cancer prevention and surveillance  RECOMMENDATIONS FOR FAMILY MEMBERS:  Individuals in this family might be at some increased risk of developing cancer, over the general population risk, simply due to the family history of cancer.  We recommended women in this family have a yearly mammogram beginning at age 22, or 96 years younger than the earliest onset of cancer, an annual clinical breast exam, and perform monthly breast self-exams. Women in this family should also have a gynecological exam as recommended by their primary provider. Family members should be referred for colonoscopy starting at age 66, or earlier, as recommended by their providers.    FOLLOW-UP: Lastly, we discussed with Ms. Zelenak that cancer genetics is a rapidly advancing field and it is possible that new genetic tests will be appropriate for her and/or her family members in the future. We encouraged her to remain in contact with cancer genetics on an annual basis so we can update her personal and family histories and let her know of advances in cancer genetics that may benefit this family.   Our contact number was provided. Ms. Goodwill questions were  answered to her satisfaction, and she knows she is welcome to call us at anytime with additional questions or concerns.     Colena Ketterman M. Joette Catching, [place], [facility] Genetic Counselor [email or web address] (P) [phone]

## 2020-11-21 ENCOUNTER — Other Ambulatory Visit: Payer: Self-pay

## 2020-11-21 MED ORDER — LOSARTAN POTASSIUM 25 MG PO TABS: 25.0000 mg | ORAL_TABLET | Freq: Every day | ORAL | 3 refills | Status: DC

## 2020-11-28 ENCOUNTER — Telehealth: Payer: Self-pay

## 2020-11-28 NOTE — Telephone Encounter (Signed)
Pt's caregiver, Juliane Lack called on behalf of pt stating pt is having side effects from Letrozole such as significant hair loss and insomnia. Asking for advice about where to go from here. Will consult with MD and return call.

## 2020-11-29 ENCOUNTER — Encounter: Payer: Self-pay | Admitting: *Deleted

## 2020-11-29 NOTE — Progress Notes (Signed)
Per MD pt needing to stop letrozole and f/u in office.  Pt requesting next available to discuss insomnia and brown vaginal discharge.  Appt scheduled and pt verbalized understanding of date and time.

## 2020-11-29 NOTE — Progress Notes (Signed)
Patient Care Team: [name], MD as PCP - General (Internal Medicine) Martinique, Peter M, MD as PCP - Cardiology (Cardiology) Thompson Grayer, MD as PCP - Electrophysiology (Cardiology) Rockwell Germany, RN as Oncology Nurse Navigator Mauro Kaufmann, RN as Oncology Nurse Navigator Gery Pray, MD as Consulting Physician (Radiation Oncology) Nicholas Lose, MD as Consulting Physician (Hematology and Oncology) Jovita Kussmaul, MD as Consulting Physician (General Surgery)  DIAGNOSIS:    ICD-10-CM   1. Malignant neoplasm of upper-outer quadrant of left breast in female, estrogen receptor positive ([place])  C50.412    Z17.0       SUMMARY OF ONCOLOGIC HISTORY: Oncology History  Malignant neoplasm of upper-outer quadrant of left breast in female, estrogen receptor positive ([place])  08/26/2020 Initial Diagnosis   Screening detected left breast mass 0.8 cm, axilla negative, biopsy revealed grade 1 IDC ER 95%, PR 1%, HER2 equivocal by IHC, FISH negative, Ki-67 5%   08/31/2020 Cancer Staging   Staging form: Breast, AJCC 8th Edition - Clinical stage from 08/31/2020: Stage IA (cT1b, cN0, cM0, G1, ER+, PR+, HER2-) - Signed by Nicholas Lose, MD on 08/31/2020 Stage prefix: Initial diagnosis Histologic grading system: 3 grade system   10/04/2020 Surgery   Left lumpectomy (Dr. Jovita Kussmaul): 0.9 cm, grade 1 invasive ductal carcinoma with resection margins negative for carcinoma     CHIEF COMPLIANT: Follow-up of left breast cancer  INTERVAL HISTORY: [name] [name] is a 87 y.o. with above-mentioned history of left breast cancer having undergone lumpectomy. She reports to the clinic to day for follow-up.  She started letrozole therapy and immediately started to have difficulty with sleeping as well as vaginal discharge.  We instructed her to stop the medication and come see [place].  Since she stopped it her sleeping has improved as well as the discharge is also gone down.  ALLERGIES:  is allergic to  penicillins, tetanus toxoid, demerol, clindamycin/lincomycin, and codeine.  MEDICATIONS:  Current Outpatient Medications  Medication Sig Dispense Refill   amiodarone (PACERONE) 200 MG tablet TAKE 1/2 TABLET(100 MG) BY MOUTH DAILY (Patient taking differently: Take 100 mg by mouth daily.) 45 tablet 3   amLODipine (NORVASC) 5 MG tablet TAKE 1 TABLET(5 MG) BY MOUTH DAILY (Patient taking differently: Take 5 mg by mouth daily.) 90 tablet 3   calcium carbonate (OS-CAL) 600 MG TABS Take 600 mg by mouth 2 (two) times daily with a meal.     ELIQUIS 2.5 MG TABS tablet TAKE 1 TABLET(2.5 MG) BY MOUTH TWICE DAILY (Patient taking differently: Take 2.5 mg by mouth 2 (two) times daily.) 180 tablet 1   ezetimibe (ZETIA) 10 MG tablet TAKE 1 TABLET(10 MG) BY MOUTH DAILY (Patient taking differently: Take 10 mg by mouth daily.) 90 tablet 3   feeding supplement, ENSURE ENLIVE, (ENSURE ENLIVE) LIQD Take 237 mLs by mouth 2 (two) times daily between meals. (Patient taking differently: Take 237 mLs by mouth daily.) 237 mL 12   fluticasone (FLONASE) 50 MCG/ACT nasal spray Place 2 sprays into both nostrils daily as needed for allergies (congestion).     furosemide (LASIX) 20 MG tablet TAKE 1 TABLET BY MOUTH EVERY DAY; EXCEPT ON MONDAY AND FRIDAY (Patient taking differently: Take 20 mg by mouth every other day.) 90 tablet 3   letrozole (FEMARA) 2.5 MG tablet Take 1 tablet (2.5 mg total) by mouth daily. 90 tablet 3   losartan (COZAAR) 25 MG tablet Take 1 tablet (25 mg total) by mouth daily. 90 tablet 3  metoprolol tartrate (LOPRESSOR) 25 MG tablet TAKE 1 AND 1/2 TABLETS(37.5 MG) BY MOUTH TWICE DAILY (Patient taking differently: Take 37.5 mg by mouth 2 (two) times daily.) 270 tablet 2   Multiple Vitamins-Minerals (PRESERVISION AREDS 2) CAPS Take 1 capsule by mouth 2 (two) times daily.     nitroGLYCERIN (NITROSTAT) 0.4 MG SL tablet Place 1 tablet (0.4 mg total) under the tongue every 5 (five) minutes as needed for chest pain. 25  tablet 12   pantoprazole (PROTONIX) 40 MG tablet Take 1 tablet (40 mg total) by mouth 2 (two) times daily before a meal. (Patient taking differently: Take 40 mg by mouth daily.) 180 tablet 3   polyvinyl alcohol (LIQUIFILM TEARS) 1.4 % ophthalmic solution Place 1-2 drops into both eyes 3 (three) times daily as needed for dry eyes.     SYNTHROID 125 MCG tablet Take 125 mcg by mouth daily before breakfast.      traMADol (ULTRAM) 50 MG tablet Take 1 tablet (50 mg total) by mouth every 6 (six) hours as needed. 15 tablet 0   No current facility-administered medications for this visit.    PHYSICAL EXAMINATION: ECOG PERFORMANCE STATUS: 1 - Symptomatic but completely ambulatory  There were no vitals filed for this visit. There were no vitals filed for this visit.  BREAST: No palpable masses or nodules in either right or left breasts. No palpable axillary supraclavicular or infraclavicular adenopathy no breast tenderness or nipple discharge. (exam performed in the presence of a chaperone)  LABORATORY DATA:  I have reviewed the data as listed CMP Latest Ref Rng & Units 09/27/2020 08/31/2020 06/07/2020  Glucose 70 - 99 mg/dL 98 96 95  BUN 8 - 23 mg/dL 33(H) 39(H) 34(H)  Creatinine 0.44 - 1.00 mg/dL 1.25(H) 1.27(H) 1.46(H)  Sodium 135 - 145 mmol/L 134(L) 139 142  Potassium 3.5 - 5.1 mmol/L 4.6 4.6 5.1  Chloride 98 - 111 mmol/L 98 100 99  CO2 22 - 32 mmol/L 30 28 20   Calcium 8.9 - 10.3 mg/dL 9.8 9.8 9.9  Total Protein 6.5 - 8.1 g/dL - 6.5 7.1  Total Bilirubin 0.3 - 1.2 mg/dL - 1.2 0.7  Alkaline Phos 38 - 126 U/L - 66 83  AST 15 - 41 U/L - 23 28  ALT 0 - 44 U/L - 12 14    Lab Results  Component Value Date   WBC 6.6 09/27/2020   HGB 13.7 09/27/2020   HCT 42.0 09/27/2020   MCV 106.6 (H) 09/27/2020   PLT 179 09/27/2020   NEUTROABS 4.2 08/31/2020    ASSESSMENT & PLAN:  Malignant neoplasm of upper-outer quadrant of left breast in female, estrogen receptor positive ([place]) June 2022:Screening  detected left breast mass 0.8 cm, axilla negative, biopsy revealed grade 1 IDC ER 95%, PR 1%, HER2 equivocal by IHC, FISH negative, Ki-67 5%   10/04/2020:Left lumpectomy (Dr. Autumn Messing): 0.9 cm, grade 1 invasive ductal carcinoma with resection margins negative for carcinoma   Treatment plan: 1.  Given the favorable type of breast cancer and the size and negative margins, we decided not to pursue radiation. 2.  Adjuvant antiestrogen therapy with letrozole 2.5 mg daily (stopped because of side effects)   Letrozole toxicities: Insomnia and brown vaginal discharge requiring discontinuation of letrozole therapy. I discussed the pros and cons of switching her to a different medication but given her very favorable prognosis and her advanced age, we decided not to pursue any further antiestrogen treatment  Breast Cancer Surveillance: 1. Breast exam 11/22/2020: Normal  2. Mammogram to be done June 2023  She has an appointment for survivorship care plan visit.  After that she could be seen once a year by the long-term survivorship clinic.  No orders of the defined types were placed in this encounter.  The patient has a good understanding of the overall plan. she agrees with it. she will call with any problems that may develop before the next visit here.  Total time spent: 20 mins including face to face time and time spent for planning, charting and coordination of care  Rulon Eisenmenger, MD, MPH 11/30/2020  I, Thana Ates, am acting as scribe for Dr. Nicholas Lose.  I have reviewed the above documentation for accuracy and completeness, and I agree with the above.

## 2020-11-30 ENCOUNTER — Inpatient Hospital Stay: Payer: Medicare PPO | Attending: Hematology and Oncology | Admitting: Hematology and Oncology

## 2020-11-30 ENCOUNTER — Other Ambulatory Visit: Payer: Self-pay

## 2020-11-30 DIAGNOSIS — C50412 Malignant neoplasm of upper-outer quadrant of left female breast: Secondary | ICD-10-CM | POA: Diagnosis not present

## 2020-11-30 DIAGNOSIS — Z79899 Other long term (current) drug therapy: Secondary | ICD-10-CM | POA: Insufficient documentation

## 2020-11-30 DIAGNOSIS — Z170 Estrogen receptor positive status [ER+]: Secondary | ICD-10-CM | POA: Diagnosis not present

## 2020-11-30 DIAGNOSIS — Z79811 Long term (current) use of aromatase inhibitors: Secondary | ICD-10-CM | POA: Insufficient documentation

## 2020-11-30 NOTE — Assessment & Plan Note (Signed)
June 2022:Screening detected left breast mass 0.8 cm, axilla negative, biopsy revealed grade 1 IDC ER 95%, PR 1%, HER2 equivocal by IHC, FISH negative, Ki-67 5%  10/04/2020:Left lumpectomy (Dr. Autumn Messing): 0.9 cm, grade 1 invasive ductal carcinoma with resection margins negative for carcinoma  Treatment plan: 1.Given the favorable type of breast cancer and the size and negative margins, we decided not to pursue radiation. 2.Adjuvant antiestrogen therapy with letrozole 2.5 mg daily x5 years    Letrozole toxicities: Insomnia and brown vaginal discharge requiring discontinuation of letrozole therapy.   Breast Cancer Surveillance: 1. Breast exam 11/22/2020: Normal 2. Mammogram to be done June 2023

## 2020-12-06 ENCOUNTER — Ambulatory Visit (INDEPENDENT_AMBULATORY_CARE_PROVIDER_SITE_OTHER): Payer: Medicare PPO

## 2020-12-06 DIAGNOSIS — I495 Sick sinus syndrome: Secondary | ICD-10-CM

## 2020-12-06 LAB — CUP PACEART REMOTE DEVICE CHECK
Battery Impedance: 283 Ohm
Battery Remaining Longevity: 101 mo
Battery Voltage: 2.78 V
Brady Statistic AP VP Percent: 0 %
Brady Statistic AP VS Percent: 99 %
Brady Statistic AS VP Percent: 0 %
Brady Statistic AS VS Percent: 1 %
Date Time Interrogation Session: 20221004080944
Implantable Lead Implant Date: 20180807
Implantable Lead Implant Date: 20180807
Implantable Lead Location: 753859
Implantable Lead Location: 753860
Implantable Lead Model: 5076
Implantable Lead Model: 5076
Implantable Pulse Generator Implant Date: 20180807
Lead Channel Impedance Value: 314 Ohm
Lead Channel Impedance Value: 470 Ohm
Lead Channel Pacing Threshold Amplitude: 0.75 V
Lead Channel Pacing Threshold Amplitude: 0.75 V
Lead Channel Pacing Threshold Pulse Width: 0.4 ms
Lead Channel Pacing Threshold Pulse Width: 0.4 ms
Lead Channel Setting Pacing Amplitude: 2 V
Lead Channel Setting Pacing Amplitude: 2.5 V
Lead Channel Setting Pacing Pulse Width: 0.4 ms
Lead Channel Setting Sensing Sensitivity: 5.6 mV

## 2020-12-14 NOTE — Progress Notes (Signed)
Remote pacemaker transmission.   

## 2020-12-16 ENCOUNTER — Encounter (HOSPITAL_COMMUNITY): Payer: Self-pay | Admitting: Emergency Medicine

## 2020-12-16 ENCOUNTER — Emergency Department (HOSPITAL_COMMUNITY)
Admission: EM | Admit: 2020-12-16 | Discharge: 2020-12-17 | Disposition: A | Discharge status: Home / Self Care | Payer: Medicare PPO | Attending: Emergency Medicine | Admitting: Emergency Medicine

## 2020-12-16 ENCOUNTER — Other Ambulatory Visit: Payer: Self-pay

## 2020-12-16 ENCOUNTER — Emergency Department (HOSPITAL_COMMUNITY): Payer: Medicare PPO

## 2020-12-16 DIAGNOSIS — I480 Paroxysmal atrial fibrillation: Secondary | ICD-10-CM | POA: Diagnosis not present

## 2020-12-16 DIAGNOSIS — R531 Weakness: Secondary | ICD-10-CM | POA: Insufficient documentation

## 2020-12-16 DIAGNOSIS — Z853 Personal history of malignant neoplasm of breast: Secondary | ICD-10-CM | POA: Insufficient documentation

## 2020-12-16 DIAGNOSIS — E039 Hypothyroidism, unspecified: Secondary | ICD-10-CM | POA: Diagnosis not present

## 2020-12-16 DIAGNOSIS — R202 Paresthesia of skin: Secondary | ICD-10-CM | POA: Diagnosis not present

## 2020-12-16 DIAGNOSIS — I10 Essential (primary) hypertension: Secondary | ICD-10-CM | POA: Diagnosis not present

## 2020-12-16 DIAGNOSIS — Z9012 Acquired absence of left breast and nipple: Secondary | ICD-10-CM | POA: Insufficient documentation

## 2020-12-16 DIAGNOSIS — Z85828 Personal history of other malignant neoplasm of skin: Secondary | ICD-10-CM | POA: Insufficient documentation

## 2020-12-16 DIAGNOSIS — Z7901 Long term (current) use of anticoagulants: Secondary | ICD-10-CM | POA: Insufficient documentation

## 2020-12-16 DIAGNOSIS — N189 Chronic kidney disease, unspecified: Secondary | ICD-10-CM | POA: Insufficient documentation

## 2020-12-16 DIAGNOSIS — Z79899 Other long term (current) drug therapy: Secondary | ICD-10-CM | POA: Insufficient documentation

## 2020-12-16 DIAGNOSIS — M47812 Spondylosis without myelopathy or radiculopathy, cervical region: Secondary | ICD-10-CM | POA: Diagnosis not present

## 2020-12-16 DIAGNOSIS — I2510 Atherosclerotic heart disease of native coronary artery without angina pectoris: Secondary | ICD-10-CM | POA: Diagnosis not present

## 2020-12-16 DIAGNOSIS — R079 Chest pain, unspecified: Secondary | ICD-10-CM

## 2020-12-16 DIAGNOSIS — R209 Unspecified disturbances of skin sensation: Secondary | ICD-10-CM | POA: Diagnosis not present

## 2020-12-16 DIAGNOSIS — Z950 Presence of cardiac pacemaker: Secondary | ICD-10-CM | POA: Diagnosis not present

## 2020-12-16 DIAGNOSIS — I130 Hypertensive heart and chronic kidney disease with heart failure and stage 1 through stage 4 chronic kidney disease, or unspecified chronic kidney disease: Secondary | ICD-10-CM | POA: Diagnosis not present

## 2020-12-16 DIAGNOSIS — I5033 Acute on chronic diastolic (congestive) heart failure: Secondary | ICD-10-CM | POA: Diagnosis not present

## 2020-12-16 DIAGNOSIS — Z87891 Personal history of nicotine dependence: Secondary | ICD-10-CM | POA: Diagnosis not present

## 2020-12-16 DIAGNOSIS — G319 Degenerative disease of nervous system, unspecified: Secondary | ICD-10-CM | POA: Diagnosis not present

## 2020-12-16 DIAGNOSIS — J984 Other disorders of lung: Secondary | ICD-10-CM | POA: Diagnosis not present

## 2020-12-16 LAB — BASIC METABOLIC PANEL
Anion gap: 8 (ref 5–15)
BUN: 38 mg/dL — ABNORMAL HIGH (ref 8–23)
CO2: 29 mmol/L (ref 22–32)
Calcium: 9.9 mg/dL (ref 8.9–10.3)
Chloride: 100 mmol/L (ref 98–111)
Creatinine, Ser: 1.24 mg/dL — ABNORMAL HIGH (ref 0.44–1.00)
GFR, Estimated: 42 mL/min — ABNORMAL LOW (ref >60)
Glucose, Bld: 114 mg/dL — ABNORMAL HIGH (ref 70–99)
Potassium: 4.5 mmol/L (ref 3.5–5.1)
Sodium: 137 mmol/L (ref 135–145)

## 2020-12-16 LAB — TROPONIN I (HIGH SENSITIVITY)
Troponin I (High Sensitivity): 7 ng/L (ref <18)
Troponin I (High Sensitivity): 8 ng/L (ref <18)

## 2020-12-16 LAB — CBC
HCT: 41.3 % (ref 36.0–46.0)
Hemoglobin: 13.4 g/dL (ref 12.0–15.0)
MCH: 34.2 pg — ABNORMAL HIGH (ref 26.0–34.0)
MCHC: 32.4 g/dL (ref 30.0–36.0)
MCV: 105.4 fL — ABNORMAL HIGH (ref 80.0–100.0)
Platelets: 166 10*3/uL (ref 150–400)
RBC: 3.92 MIL/uL (ref 3.87–5.11)
RDW: 12.8 % (ref 11.5–15.5)
WBC: 6.1 10*3/uL (ref 4.0–10.5)
nRBC: 0 % (ref 0.0–0.2)

## 2020-12-16 NOTE — ED Triage Notes (Signed)
Pt arrives via EMS from home with 3 days of bilateral arm numbness and tingling. Pt also having chest fluttering that started today.

## 2020-12-16 NOTE — ED Provider Notes (Signed)
Emergency Medicine Provider Triage Evaluation Note  [name] [name] , a 87 y.o. female  was evaluated in triage.  Pt complains of tingling sensation.  Review of Systems  Positive: Arms tingling, mild chest pressure, and heart palpitation Negative: Fever, cough, sob, headache, confusion, focal weakness  Physical Exam  BP 120/69 (BP Location: Right Arm)   Pulse 63   Temp 98.9 F (37.2 C) (Oral)   Resp 18   SpO2 98%  Gen:   Awake, no distress   Resp:  Normal effort  MSK:   Moves extremities without difficulty  Other:    Medical Decision Making  Medically screening exam initiated at 4:01 PM.  Appropriate orders placed.  [name] [name] was informed that the remainder of the evaluation will be completed by another provider, this initial triage assessment does not replace that evaluation, and the importance of remaining in the ED until their evaluation is complete.  Pt was using a leaf blower a few days ago, afterward she report having tingling sensation to both arms.  It went away but came back the next day.  Today she felt a mild chest pressure with heart fluttering.  Hx of afib.  PCP recommend coming to the ER for evaluation.    [name] Moras, PA-C 12/16/20 1605    [name] Morrison, MD 12/21/20 [phone]

## 2020-12-17 ENCOUNTER — Emergency Department (HOSPITAL_COMMUNITY): Payer: Medicare PPO

## 2020-12-17 DIAGNOSIS — R531 Weakness: Secondary | ICD-10-CM | POA: Diagnosis not present

## 2020-12-17 DIAGNOSIS — M47812 Spondylosis without myelopathy or radiculopathy, cervical region: Secondary | ICD-10-CM | POA: Diagnosis not present

## 2020-12-17 DIAGNOSIS — J984 Other disorders of lung: Secondary | ICD-10-CM | POA: Diagnosis not present

## 2020-12-17 DIAGNOSIS — G319 Degenerative disease of nervous system, unspecified: Secondary | ICD-10-CM | POA: Diagnosis not present

## 2020-12-17 NOTE — ED Provider Notes (Signed)
[name] EMERGENCY DEPARTMENT Provider Note   CSN: [id number] Arrival date & time: 12/16/20  1536     History Chief Complaint  Patient presents with   Tingling    [name] [name] is a 87 y.o. female.  The history is provided by the patient, a caregiver and medical records.  [name] [name] is a 87 y.o. female who presents to the Emergency Department complaining of [name] [name]. She presents the emergency department accompanied by her caregiver for evaluation of bilateral upper extremity weakness. Symptoms started Tuesday afternoon after using a leaf blower in her yard. When she returned to her home she noticed that she had significant weakness in her right arm as well as a numbness followed by a pins and needles sensation. This involves the entire arm and she had difficulty being able to use it for eating. Her symptoms have been gradually improving in the right upper extremity but Thursday night she noticed that her left arm was also weak and having tingling sensation. She contacted her PCP today, who recommended emergency department evaluation. She denies any associated headache, chest pain, neck pain, back pain. No recent illnesses. She has a history of CKD, coronary artery disease, atrial fibrillation on eliquis, pacemaker placement. She does have a recent history of stage I breast cancer status post resection.    Past Medical History:  Diagnosis Date   CKD (chronic kidney disease)    Complication of anesthesia    Coronary artery disease    a. 12/20/2016: s/p DES to LAD    Diverticulosis    Family history of adverse reaction to anesthesia    sister also has post-op n/v   Family history of breast cancer 09/01/2020   GERD (gastroesophageal reflux disease)    Hemorrhoids    Hyperlipidemia    Hypertension    Hypothyroidism    Osteoarthritis    Paroxysmal atrial fibrillation ([place])    a. on [place] with Xarelto but switched to Eliquis after stenting.    Personal history of  skin cancer 09/01/2020   PONV (postoperative nausea and vomiting)    Presence of permanent cardiac pacemaker    S/P TAVR (transcatheter aortic valve replacement) 01/29/2017   23 mm Edwards Sapien 3 transcatheter heart valve placed via percutaneous right transfemoral approach     Patient Active Problem List   Diagnosis Date Noted   Genetic testing 11/03/2020   Family history of breast cancer 09/01/2020   Personal history of skin cancer 09/01/2020   Malignant neoplasm of upper-outer quadrant of left breast in female, estrogen receptor positive ([place]) 08/26/2020   Hip fracture ([place]) 10/19/2018   Sick sinus syndrome ([place])    Presence of permanent cardiac pacemaker    Paroxysmal atrial fibrillation (H[facility])    Osteoarthritis    Hypothyroidism    Hypertension    Hyperlipidemia    Hemorrhoids    GERD (gastroesophageal reflux disease)    Diverticulosis    Coronary artery disease    Complication of anesthesia    Aortic stenosis, severe    Pericarditis 01/31/2017   Acute on chronic diastolic heart failure ([place]) 01/30/2017   Acute blood loss as cause of postoperative anemia 01/30/2017   S/P TAVR (transcatheter aortic valve replacement) 01/29/2017   Anemia    CKD (chronic kidney disease)    CAD in native artery 12/20/2016   Anticoagulated 05/31/2016   Pacemaker-Medtronic 01/01/2012   Long term current use of anticoagulant therapy 06/18/2011   Severe aortic stenosis 09/01/2010   BRADYCARDIA-TACHYCARDIA SYNDROME  03/31/2010   HLD (hyperlipidemia) 03/30/2010   Essential hypertension 03/30/2010   ATRIAL FIBRILLATION 03/30/2010   OSTEOARTHRITIS 03/30/2010    Past Surgical History:  Procedure Laterality Date   ABDOMINAL HYSTERECTOMY  1980   BASAL CELL CARCINOMA EXCISION     BREAST LUMPECTOMY WITH RADIOACTIVE SEED LOCALIZATION Left 10/04/2020   Procedure: LEFT BREAST LUMPECTOMY WITH RADIOACTIVE SEED LOCALIZATION;  Surgeon: Jovita Kussmaul, MD;  Location: [place];  Service: General;  Laterality:  Left;   CARDIAC CATHETERIZATION  ~ 11/2016   CATARACT EXTRACTION W/ INTRAOCULAR LENS  IMPLANT, BILATERAL Bilateral    CORONARY ANGIOPLASTY WITH STENT PLACEMENT  12/20/2016   "1 stent"   CORONARY STENT INTERVENTION N/A 12/20/2016   Procedure: CORONARY STENT INTERVENTION;  Surgeon: Martinique, Peter M, MD;  Location: [place] [facility];  Service: Cardiovascular;  Laterality: N/A;   FINGER SURGERY Left    "fell; developed skiers thumb; had to operate on it"   INSERT / REPLACE / [place]   for syptomatic bradycardia and syncope -- in [place] (IM) NAIL INTERTROCHANTERIC Left 10/19/2018   Procedure: INTRAMEDULLARY (IM) NAIL INTERTROCHANTRIC;  Surgeon: Altamese Pisgah, MD;  Location: [place];  Service: Orthopedics;  Laterality: Left;   LEAD REVISION/REPAIR N/A 10/09/2016   New left subclavian MDT Adapta L PPM dual chamber system implanted by Dr [name] with previously placed R subclavian system abandoned   PACEMAKER GENERATOR CHANGE  2001   pulse generator replacement by Dr. Pincus Large GENERATOR CHANGE  04/01/2008   PPM Medtronic -- model # [id number] serial # [id number] -- pulse generator replacement by Dr. Verlon Setting    RIGHT/LEFT [place] N/A 11/27/2016   Procedure: RIGHT/LEFT HEART CATH AND CORONARY ANGIOGRAPHY;  Surgeon: Martinique, Peter M, MD;  Location: [place] [facility];  Service: Cardiovascular;  Laterality: N/A;   SQUAMOUS CELL CARCINOMA EXCISION     TEE WITHOUT CARDIOVERSION N/A 01/29/2017   Procedure: TRANSESOPHAGEAL ECHOCARDIOGRAM (TEE);  Surgeon: Sherren Mocha, MD;  Location: [facility];  Service: Open Heart Surgery;  Laterality: N/A;   TONSILLECTOMY     TRANSCATHETER AORTIC VALVE REPLACEMENT, TRANSFEMORAL N/A 01/29/2017   Procedure: TRANSCATHETER AORTIC VALVE REPLACEMENT, TRANSFEMORAL;  Surgeon: Sherren Mocha, MD;  Location: [facility];  Service: Open Heart Surgery;  Laterality: N/A;     OB History   No obstetric history on file.      Family History  Problem Relation Age of Onset   Alzheimer's disease Mother    Kidney cancer Father        dx 40s   Breast cancer Maternal Aunt 55   Breast cancer Cousin        maternal cousin, dx >50    Social History   Tobacco Use   Smoking status: Former    Packs/day: 1.50    Years: 30.00    Pack years: 45.00    Types: Cigarettes    Quit date: 03/05/1978    Years since quitting: 42.8   Smokeless tobacco: Never  Vaping Use   Vaping Use: Never used  Substance Use Topics   Alcohol use: No   Drug use: No    Home Medications Prior to Admission medications   Medication Sig Start Date End Date Taking? Authorizing Provider  amiodarone (PACERONE) 200 MG tablet TAKE 1/2 TABLET(100 MG) BY MOUTH DAILY Patient taking differently: Take 100 mg by mouth daily. 04/04/20   Martinique, Peter M, MD  amLODipine (NORVASC) 5 MG tablet TAKE 1 TABLET(5  MG) BY MOUTH DAILY Patient taking differently: Take 5 mg by mouth daily. 02/05/20   Martinique, Peter M, MD  calcium carbonate (OS-CAL) 600 MG TABS Take 600 mg by mouth 2 (two) times daily with a meal.    [provider]  ELIQUIS 2.5 MG TABS tablet TAKE 1 TABLET(2.5 MG) BY MOUTH TWICE DAILY Patient taking differently: Take 2.5 mg by mouth 2 (two) times daily. 07/13/20   [place]ique, Peter M, MD  ezetimibe (ZETIA) 10 MG tablet TAKE 1 TABLET(10 MG) BY MOUTH DAILY Patient taking differently: Take 10 mg by mouth daily. 03/07/20   [place], Peter M, MD  feeding supplement, ENSURE ENLIVE, (ENSURE ENLIVE) LIQD Take 237 mLs by mouth 2 (two) times daily between meals. Patient taking differently: Take 237 mLs by mouth daily. 10/24/18   Ainsley Spinner, PA-C  fluticasone [facility]) 50 MCG/ACT nasal spray Place 2 sprays into both nostrils daily as needed for allergies (congestion). 06/30/18   [provider]  furosemide (LASIX) 20 MG tablet TAKE 1 TABLET BY MOUTH EVERY DAY; [place] Patient taking differently: Take 20 mg by mouth every other  day. 07/07/20   [place], Peter M, MD  losartan (COZAAR) 25 MG tablet Take 1 tablet (25 mg total) by mouth daily. 11/21/20   [place], Peter M, MD  metoprolol tartrate (LOPRESSOR) 25 MG tablet TAKE 1 AND 1/2 TABLETS(37.5 MG) BY MOUTH TWICE DAILY Patient taking differently: Take 37.5 mg by mouth 2 (two) times daily. 09/12/20   [place], Peter M, MD  Multiple Vitamins-Minerals (PRESERVISION AREDS 2) CAPS Take 1 capsule by mouth 2 (two) times daily.    [provider]  nitroGLYCERIN (NITROSTAT) 0.4 MG SL tablet Place 1 tablet (0.4 mg total) under the tongue every 5 (five) minutes as needed for chest pain. 08/22/20   [place], Peter M, MD  pantoprazole (PROTONIX) 40 MG tablet Take 1 tablet (40 mg total) by mouth 2 (two) times daily before a meal. Patient taking differently: Take 40 mg by mouth daily. 06/20/20   [place], Peter M, MD  polyvinyl alcohol (LIQUIFILM TEARS) 1.4 % ophthalmic solution Place 1-2 drops into both eyes 3 (three) times daily as needed for dry eyes.    [provider]  SYNTHROID 125 MCG tablet Take 125 mcg by mouth daily before breakfast.  08/04/18   [provider]  traMADol (ULTRAM) 50 MG tablet Take 1 tablet (50 mg total) by mouth every 6 (six) hours as needed. 10/04/20 10/04/21  Jovita Kussmaul, MD    Allergies    Penicillins, Tetanus toxoid, Demerol, Clindamycin/lincomycin, and Codeine  Review of Systems   Review of Systems  All other systems reviewed and are negative.  Physical Exam Updated Vital Signs BP 133/60 (BP Location: Left Arm)   Pulse 64   Temp 97.7 F (36.5 C)   Resp 14   SpO2 96%   Physical Exam Vitals and nursing note reviewed.  Constitutional:      Appearance: She is well-developed.  HENT:     Head: Normocephalic and atraumatic.  Cardiovascular:     Rate and Rhythm: Normal rate and regular rhythm.     Heart sounds: No murmur heard. Pulmonary:     Effort: Pulmonary effort is normal. No respiratory distress.     Breath sounds: Normal  breath sounds.  Abdominal:     Palpations: Abdomen is soft.     Tenderness: There is no abdominal tenderness. There is no guarding or rebound.  Musculoskeletal:  General: No tenderness.  Skin:    General: Skin is warm and dry.  Neurological:     Mental Status: She is alert and oriented to person, place, and time.     Comments: 5/5 strength in proximal and distal groups in all four extremities with sensation to light touch in all four extremities.    Psychiatric:        Behavior: Behavior normal.    ED Results / Procedures / Treatments   Labs (all labs ordered are listed, but only abnormal results are displayed) Labs Reviewed  BASIC METABOLIC PANEL - Abnormal; Notable for the following components:      Result Value   Glucose, Bld 114 (*)    BUN 38 (*)    Creatinine, Ser 1.24 (*)    GFR, Estimated 42 (*)    All other components within normal limits  CBC - Abnormal; Notable for the following components:   MCV 105.4 (*)    MCH 34.2 (*)    All other components within normal limits  TROPONIN I (HIGH SENSITIVITY)  TROPONIN I (HIGH SENSITIVITY)    EKG EKG Interpretation  Date/Time:  Friday December 16 2020 15:48:58 EDT Ventricular Rate:  75 PR Interval:  236 QRS Duration: 90 QT Interval:  376 QTC Calculation: 419 R Axis:   62 Text Interpretation: Atrial-paced rhythm with prolonged AV conduction Abnormal ECG Confirmed by Quintella Reichert [phone]) on 12/17/2020 12:15:44 AM  Radiology DG Chest 1 View  Result Date: 12/16/2020 CLINICAL DATA:  Chest pain. EXAM: CHEST  1 VIEW COMPARISON:  October 19, 2018 FINDINGS: A dual lead AICD is noted on the left. Multiple radiopaque lead wires from a prior device are noted overlying the right lung. There is no evidence of acute infiltrate. A trace amount of pleural fluid is suspected, bilaterally. No pneumothorax is identified. The heart size and mediastinal contours are within normal limits an artificial aortic valve is seen. A coronary  artery stent is also in place. The visualized skeletal structures are unremarkable. IMPRESSION: 1. No acute infiltrate. 2. Trace amount of bilateral pleural fluid. 3. Sternotomy, aortic valve replacement and coronary artery stent placement. Electronically Signed   By: Virgina Norfolk M.D.   On: 12/16/2020 16:49   CT Head Wo Contrast  Result Date: 12/17/2020 CLINICAL DATA:  Upper extremity weakness EXAM: CT HEAD WITHOUT CONTRAST CT CERVICAL SPINE WITHOUT CONTRAST TECHNIQUE: Multidetector CT imaging of the head and cervical spine was performed following the standard protocol without intravenous contrast. Multiplanar CT image reconstructions of the cervical spine were also generated. COMPARISON:  None. FINDINGS: CT HEAD FINDINGS Brain: No evidence of acute infarction, hemorrhage, hydrocephalus, extra-axial collection or mass lesion/mass effect. Global cortical atrophy. Vascular: No hyperdense vessel or unexpected calcification. Skull: Normal. Negative for fracture or focal lesion. Sinuses/Orbits: Complete opacification of the right sphenoid sinus. Visualized paranasal sinuses and mastoid air cells are otherwise clear. Other: None. CT CERVICAL SPINE FINDINGS Alignment: Normal cervical lordosis. Skull base and vertebrae: No acute fracture. No primary bone lesion or focal pathologic process. Soft tissues and spinal canal: No prevertebral fluid or swelling. No visible canal hematoma. Disc levels: Mild degenerative changes of the mid cervical spine. Spinal canal is patent. Upper chest: Visualized lung apices are notable for biapical pleural-parenchymal scarring. Other: Visualized thyroid is unremarkable. IMPRESSION: No evidence of acute intracranial abnormality. Global cortical atrophy. No evidence of traumatic injury to the cervical spine. Mild degenerative changes. Electronically Signed   By: Julian Hy M.D.   On: 12/17/2020 01:35  CT Cervical Spine Wo Contrast  Result Date: 12/17/2020 CLINICAL DATA:   Upper extremity weakness EXAM: CT HEAD WITHOUT CONTRAST CT CERVICAL SPINE WITHOUT CONTRAST TECHNIQUE: Multidetector CT imaging of the head and cervical spine was performed following the standard protocol without intravenous contrast. Multiplanar CT image reconstructions of the cervical spine were also generated. COMPARISON:  None. FINDINGS: CT HEAD FINDINGS Brain: No evidence of acute infarction, hemorrhage, hydrocephalus, extra-axial collection or mass lesion/mass effect. Global cortical atrophy. Vascular: No hyperdense vessel or unexpected calcification. Skull: Normal. Negative for fracture or focal lesion. Sinuses/Orbits: Complete opacification of the right sphenoid sinus. Visualized paranasal sinuses and mastoid air cells are otherwise clear. Other: None. CT CERVICAL SPINE FINDINGS Alignment: Normal cervical lordosis. Skull base and vertebrae: No acute fracture. No primary bone lesion or focal pathologic process. Soft tissues and spinal canal: No prevertebral fluid or swelling. No visible canal hematoma. Disc levels: Mild degenerative changes of the mid cervical spine. Spinal canal is patent. Upper chest: Visualized lung apices are notable for biapical pleural-parenchymal scarring. Other: Visualized thyroid is unremarkable. IMPRESSION: No evidence of acute intracranial abnormality. Global cortical atrophy. No evidence of traumatic injury to the cervical spine. Mild degenerative changes. Electronically Signed   By: Julian Hy M.D.   On: 12/17/2020 01:35    Procedures Procedures   Medications Ordered in ED Medications - No data to display  ED Course  I have reviewed the triage vital signs and the nursing notes.  Pertinent labs & imaging results that were available during my care of the patient were reviewed by me and considered in my medical decision making (see chart for details).    MDM Rules/Calculators/A&P                          patient here for evaluation of bilateral upper extremity  numbness and weakness following exertion on Tuesday. Overall her symptoms are resolving and on examination and she is neurologically intact. Labs with stable renal insufficiency. CT head and neck are negative for acute abnormality. She has a pacemaker that is not MRI compatible do not feel that she needs an MRI at this time but she has neurologically recovered. Discussed with patient importance of decreasing her heavy exertion as that may be exacerbating her symptoms. Plan to discharge home with outpatient follow-up and return precautions  Final Clinical Impression(s) / ED Diagnoses Final diagnoses:  Paresthesia    Rx / DC Orders ED Discharge Orders     None        Quintella Reichert, MD 12/17/20 [phone]

## 2020-12-20 DIAGNOSIS — R0789 Other chest pain: Secondary | ICD-10-CM | POA: Diagnosis not present

## 2020-12-20 DIAGNOSIS — R202 Paresthesia of skin: Secondary | ICD-10-CM | POA: Diagnosis not present

## 2020-12-23 DIAGNOSIS — H02055 Trichiasis without entropian left lower eyelid: Secondary | ICD-10-CM | POA: Diagnosis not present

## 2020-12-23 DIAGNOSIS — H353132 Nonexudative age-related macular degeneration, bilateral, intermediate dry stage: Secondary | ICD-10-CM | POA: Diagnosis not present

## 2020-12-26 DIAGNOSIS — M810 Age-related osteoporosis without current pathological fracture: Secondary | ICD-10-CM | POA: Diagnosis not present

## 2020-12-26 DIAGNOSIS — E785 Hyperlipidemia, unspecified: Secondary | ICD-10-CM | POA: Diagnosis not present

## 2020-12-26 DIAGNOSIS — E039 Hypothyroidism, unspecified: Secondary | ICD-10-CM | POA: Diagnosis not present

## 2020-12-26 DIAGNOSIS — I10 Essential (primary) hypertension: Secondary | ICD-10-CM | POA: Diagnosis not present

## 2020-12-30 DIAGNOSIS — Z23 Encounter for immunization: Secondary | ICD-10-CM | POA: Diagnosis not present

## 2020-12-30 DIAGNOSIS — R82998 Other abnormal findings in urine: Secondary | ICD-10-CM | POA: Diagnosis not present

## 2020-12-30 DIAGNOSIS — E039 Hypothyroidism, unspecified: Secondary | ICD-10-CM | POA: Diagnosis not present

## 2020-12-30 DIAGNOSIS — Z1389 Encounter for screening for other disorder: Secondary | ICD-10-CM | POA: Diagnosis not present

## 2020-12-30 DIAGNOSIS — R0789 Other chest pain: Secondary | ICD-10-CM | POA: Diagnosis not present

## 2020-12-30 DIAGNOSIS — Z1331 Encounter for screening for depression: Secondary | ICD-10-CM | POA: Diagnosis not present

## 2020-12-30 DIAGNOSIS — I872 Venous insufficiency (chronic) (peripheral): Secondary | ICD-10-CM | POA: Diagnosis not present

## 2020-12-30 DIAGNOSIS — Z0000 Encounter for general adult medical examination without abnormal findings: Secondary | ICD-10-CM | POA: Diagnosis not present

## 2020-12-30 DIAGNOSIS — E785 Hyperlipidemia, unspecified: Secondary | ICD-10-CM | POA: Diagnosis not present

## 2020-12-30 DIAGNOSIS — I10 Essential (primary) hypertension: Secondary | ICD-10-CM | POA: Diagnosis not present

## 2020-12-30 DIAGNOSIS — R42 Dizziness and giddiness: Secondary | ICD-10-CM | POA: Diagnosis not present

## 2020-12-30 DIAGNOSIS — R202 Paresthesia of skin: Secondary | ICD-10-CM | POA: Diagnosis not present

## 2020-12-30 DIAGNOSIS — C50412 Malignant neoplasm of upper-outer quadrant of left female breast: Secondary | ICD-10-CM | POA: Diagnosis not present

## 2020-12-30 DIAGNOSIS — I2510 Atherosclerotic heart disease of native coronary artery without angina pectoris: Secondary | ICD-10-CM | POA: Diagnosis not present

## 2021-01-09 ENCOUNTER — Other Ambulatory Visit: Payer: Self-pay | Admitting: Cardiology

## 2021-01-10 NOTE — Telephone Encounter (Signed)
Prescription refill request for Eliquis received. Indication:Afib  Last office visit:10/10/20 ([place])  Scr:1.24 (12/16/20) Age: 87 Weight: 57.9kg  Appropriate dose and refill sent to requested pharmacy.

## 2021-01-15 NOTE — Progress Notes (Signed)
SURVIVORSHIP  VISIT:   BRIEF ONCOLOGIC HISTORY:  Oncology History  Malignant neoplasm of upper-outer quadrant of left breast in female, estrogen receptor positive ([place])  08/26/2020 Initial Diagnosis   Screening detected left breast mass 0.8 cm, axilla negative, biopsy revealed grade 1 IDC ER 95%, PR 1%, HER2 equivocal by IHC, FISH negative, Ki-67 5%   08/31/2020 Cancer Staging   Staging form: Breast, AJCC 8th Edition - Clinical stage from 08/31/2020: Stage IA (cT1b, cN0, cM0, G1, ER+, PR+, HER2-) - Signed by Nicholas Lose, MD on 08/31/2020 Stage prefix: Initial diagnosis Histologic grading system: 3 grade system    09/13/2020 Genetic Testing   Negative.  Genes Tested include: APC, ATM, AXIN2, BARD1, BMPR1A, BRCA1, BRCA2, BRIP1, CDH1, CDK4, CDKN2A, CHEK2, DICER1, EPCAM, GREM1, HOXB13, MEN1, MLH1, MSH2, MSH3, MSH6, MUTYH, NBN, NF1, NF2, NTHL1, PALB2, PMS2, POLD1, POLE, PTEN, RAD51C, RAD51D, RECQL, RET, SDHA, SDHAF2, SDHB, SDHC, SDHD, SMAD4, SMARCA4, STK11, TP53, TSC1, TSC2, and VHL.    10/04/2020 Surgery   Left lumpectomy (Dr. Jovita Kussmaul): 0.9 cm, grade 1 invasive ductal carcinoma with resection margins negative for carcinoma   10/04/2020 Cancer Staging   Staging form: Breast, AJCC 8th Edition - Pathologic stage from 10/04/2020: Stage IA (pT1b, pN0, cM0, G1, ER+, PR+, HER2-) - Signed by Gardenia Phlegm, NP on 01/15/2021 Stage prefix: Initial diagnosis Histologic grading system: 3 grade system      INTERVAL HISTORY:  Ms. Boothe to review her survivorship care plan detailing her treatment course for breast cancer, as well as monitoring long-term side effects of that treatment, education regarding health maintenance, screening, and overall wellness and health promotion.     Overall, Ms. Suits reports feeling quite well.  She is not taking Letrozole.  On October 14 and 15 she developed numbness and tingling in her hands and arm.  She went to the ER and underwent further evaluation  there and the only explanation was overexertion from her yard work.  She notes she has arthritis in her wrists and has some mild intermittent in her hands which may be due to this.    REVIEW OF SYSTEMS:  Review of Systems  Constitutional:  Negative for appetite change, chills, fatigue, fever and unexpected weight change.  HENT:   Negative for hearing loss, lump/mass and trouble swallowing.   Eyes:  Negative for eye problems and icterus.  Respiratory:  Negative for chest tightness, cough and shortness of breath.   Cardiovascular:  Negative for chest pain, leg swelling and palpitations.  Gastrointestinal:  Negative for abdominal distention, abdominal pain, constipation, diarrhea, nausea and vomiting.  Endocrine: Negative for hot flashes.  Genitourinary:  Negative for difficulty urinating.   Musculoskeletal:  Negative for arthralgias.  Skin:  Negative for itching and rash.  Neurological:  Negative for dizziness, extremity weakness, headaches and numbness.  Hematological:  Negative for adenopathy. Does not bruise/bleed easily.  Psychiatric/Behavioral:  Negative for depression. The patient is not nervous/anxious.   Breast: Denies any new nodularity, masses, tenderness, nipple changes, or nipple discharge.      ONCOLOGY TREATMENT TEAM:  1. Surgeon:  Dr. Marlou Starks at [facility] 2. Medical Oncologist: Dr. Lindi Adie    PAST MEDICAL/SURGICAL HISTORY:  Past Medical History:  Diagnosis Date   CKD (chronic kidney disease)    Complication of anesthesia    Coronary artery disease    a. 12/20/2016: s/p DES to LAD    Diverticulosis    Family history of adverse reaction to anesthesia    sister also has post-op  n/v   Family history of breast cancer 09/01/2020   GERD (gastroesophageal reflux disease)    Hemorrhoids    Hyperlipidemia    Hypertension    Hypothyroidism    Osteoarthritis    Paroxysmal atrial fibrillation ([place])    a. on [place] with Xarelto but switched to Eliquis after  stenting.    Personal history of skin cancer 09/01/2020   PONV (postoperative nausea and vomiting)    Presence of permanent cardiac pacemaker    S/P TAVR (transcatheter aortic valve replacement) 01/29/2017   23 mm Edwards Sapien 3 transcatheter heart valve placed via percutaneous right transfemoral approach    Past Surgical History:  Procedure Laterality Date   ABDOMINAL HYSTERECTOMY  1980   BASAL CELL CARCINOMA EXCISION     BREAST LUMPECTOMY WITH RADIOACTIVE SEED LOCALIZATION Left 10/04/2020   Procedure: LEFT BREAST LUMPECTOMY WITH RADIOACTIVE SEED LOCALIZATION;  Surgeon: Jovita Kussmaul, MD;  Location: [facility];  Service: General;  Laterality: Left;   CARDIAC CATHETERIZATION  ~ 11/2016   CATARACT EXTRACTION W/ INTRAOCULAR LENS  IMPLANT, BILATERAL Bilateral    CORONARY ANGIOPLASTY WITH STENT PLACEMENT  12/20/2016   "1 stent"   CORONARY STENT INTERVENTION N/A 12/20/2016   Procedure: CORONARY STENT INTERVENTION;  Surgeon: Martinique, Peter M, MD;  Location: [facility];  Service: Cardiovascular;  Laterality: N/A;   FINGER SURGERY Left    "fell; developed skiers thumb; had to operate on it"   INSERT / REPLACE / [place]   for syptomatic bradycardia and syncope -- in [place] (IM) NAIL INTERTROCHANTERIC Left 10/19/2018   Procedure: INTRAMEDULLARY (IM) NAIL INTERTROCHANTRIC;  Surgeon: Altamese Stratford, MD;  Location: [facility];  Service: Orthopedics;  Laterality: Left;   LEAD REVISION/REPAIR N/A 10/09/2016   New left subclavian MDT Adapta L PPM dual chamber system implanted by Dr [name] with previously placed R subclavian system abandoned   PACEMAKER GENERATOR CHANGE  2001   pulse generator replacement by Dr. Pincus Large GENERATOR CHANGE  04/01/2008   PPM Medtronic -- model # ADDRL1 serial # [id number] -- pulse generator replacement by Dr. Verlon Setting    RIGHT/LEFT [name] N/A 11/27/2016   Procedure: RIGHT/LEFT HEART CATH AND CORONARY  ANGIOGRAPHY;  Surgeon: Martinique, Peter M, MD;  Location: [facility];  Service: Cardiovascular;  Laterality: N/A;   SQUAMOUS CELL CARCINOMA EXCISION     TEE WITHOUT CARDIOVERSION N/A 01/29/2017   Procedure: TRANSESOPHAGEAL ECHOCARDIOGRAM (TEE);  Surgeon: Sherren Mocha, MD;  Location: [facility];  Service: Open Heart Surgery;  Laterality: N/A;   TONSILLECTOMY     TRANSCATHETER AORTIC VALVE REPLACEMENT, TRANSFEMORAL N/A 01/29/2017   Procedure: TRANSCATHETER AORTIC VALVE REPLACEMENT, TRANSFEMORAL;  Surgeon: Sherren Mocha, MD;  Location: [facility];  Service: Open Heart Surgery;  Laterality: N/A;     ALLERGIES:  Allergies  Allergen Reactions   Penicillins Anaphylaxis and Other (See Comments)    Has patient had a PCN reaction causing immediate rash, facial/tongue/throat swelling, SOB or lightheadedness with hypotension: Yes Has patient had a PCN reaction causing severe rash involving mucus membranes or skin necrosis: No Has patient had a PCN reaction that required hospitalization: No Has patient had a PCN reaction occurring within the last 10 years: No If all of the above answers are "NO", then may proceed with Cephalosporin use.    Tetanus Toxoid Anaphylaxis   Demerol Nausea Only    Severe nausea   Clindamycin/Lincomycin     Sores in  mouth   Letrozole Other (See Comments)   Codeine Nausea Only    Severe nausea     CURRENT MEDICATIONS:  Outpatient Encounter Medications as of 01/16/2021  Medication Sig Note   amiodarone (PACERONE) 200 MG tablet TAKE 1/2 TABLET(100 MG) BY MOUTH DAILY (Patient taking differently: Take 100 mg by mouth daily.)    amLODipine (NORVASC) 5 MG tablet TAKE 1 TABLET(5 MG) BY MOUTH DAILY (Patient taking differently: Take 5 mg by mouth daily.)    calcium carbonate (OS-CAL) 600 MG TABS Take 600 mg by mouth 2 (two) times daily with a meal.    ELIQUIS 2.5 MG TABS tablet TAKE 1 TABLET(2.5 MG) BY MOUTH TWICE DAILY    ezetimibe (ZETIA) 10 MG tablet TAKE 1 TABLET(10 MG)  BY MOUTH DAILY (Patient taking differently: Take 10 mg by mouth daily.)    feeding supplement, ENSURE ENLIVE, (ENSURE ENLIVE) LIQD Take 237 mLs by mouth 2 (two) times daily between meals. (Patient taking differently: Take 237 mLs by mouth daily.)    fluticasone (FLONASE) 50 MCG/ACT nasal spray Place 2 sprays into both nostrils daily as needed for allergies (congestion).    furosemide (LASIX) 20 MG tablet TAKE 1 TABLET BY MOUTH EVERY DAY; EXCEPT ON MONDAY AND FRIDAY (Patient taking differently: Take 20 mg by mouth every other day.)    losartan (COZAAR) 25 MG tablet Take 1 tablet (25 mg total) by mouth daily.    metoprolol tartrate (LOPRESSOR) 25 MG tablet TAKE 1 AND 1/2 TABLETS(37.5 MG) BY MOUTH TWICE DAILY (Patient taking differently: Take 37.5 mg by mouth 2 (two) times daily.)    Multiple Vitamins-Minerals (PRESERVISION AREDS 2) CAPS Take 1 capsule by mouth 2 (two) times daily.    pantoprazole (PROTONIX) 40 MG tablet Take 1 tablet (40 mg total) by mouth 2 (two) times daily before a meal. (Patient taking differently: Take 40 mg by mouth daily.)    polyvinyl alcohol (LIQUIFILM TEARS) 1.4 % ophthalmic solution Place 1-2 drops into both eyes 3 (three) times daily as needed for dry eyes.    SYNTHROID 125 MCG tablet Take 125 mcg by mouth daily before breakfast.  09/23/2020: BRAND ONLY   nitroGLYCERIN (NITROSTAT) 0.4 MG SL tablet Place 1 tablet (0.4 mg total) under the tongue every 5 (five) minutes as needed for chest pain. (Patient not taking: Reported on 01/16/2021)    traMADol (ULTRAM) 50 MG tablet Take 1 tablet (50 mg total) by mouth every 6 (six) hours as needed. (Patient not taking: Reported on 01/16/2021)    No facility-administered encounter medications on file as of 01/16/2021.     ONCOLOGIC FAMILY HISTORY:  Family History  Problem Relation Age of Onset   Alzheimer's disease Mother    Kidney cancer Father        dx 62s   Breast cancer Maternal Aunt 70   Breast cancer Cousin        maternal  cousin, dx >50      SOCIAL HISTORY:  Social History   Socioeconomic History   Marital status: Widowed    Spouse name: Not on file   Number of children: 0   Years of education: Not on file   Highest education level: Not on file  Occupational History    Employer: RETIRED  Tobacco Use   Smoking status: Former    Packs/day: 1.50    Years: 30.00    Pack years: 45.00    Types: Cigarettes    Quit date: 03/05/1978    Years since quitting: 76.8  Smokeless tobacco: Never  Vaping Use   Vaping Use: Never used  Substance and Sexual Activity   Alcohol use: No   Drug use: No   Sexual activity: Not on file  Other Topics Concern   Not on file  Social History Narrative   Not on file   Social Determinants of Health   Financial Resource Strain: Low Risk    Difficulty of Paying Living Expenses: Not hard at all  Food Insecurity: No Food Insecurity   Worried About Charity fundraiser in the Last Year: Never true   [place] in the Last Year: Never true  Transportation Needs: No Transportation Needs   Lack of Transportation (Medical): No   Lack of Transportation (Non-Medical): No  Physical Activity: Insufficiently Active   Days of Exercise per Week: 2 days   Minutes of Exercise per Session: 30 min  Stress: No Stress Concern Present   Feeling of Stress : Not at all  Social Connections: Moderately Isolated   Frequency of Communication with Friends and Family: Three times a week   Frequency of Social Gatherings with Friends and Family: Twice a week   Attends Religious Services: 1 to 4 times per year   Active Member of [employer] or Organizations: No   Attends Archivist Meetings: Never   Marital Status: Widowed  Human resources officer Violence: Not At Risk   Fear of Current or Ex-Partner: No   Emotionally Abused: No   Physically Abused: No   Sexually Abused: No     OBSERVATIONS/OBJECTIVE:  BP (!) 165/78 (BP Location: Left Arm, Patient Position: Sitting)   Pulse 86   Temp  97.7 F (36.5 C) (Temporal)   Resp 16   Ht _0  (1.676 m)   Wt 127 lb 9.6 oz (57.9 kg)   SpO2 98%   BMI 20.60 kg/m  GENERAL: Patient is a well appearing female in no acute distress HEENT:  Sclerae anicteric.  Oropharynx clear and moist. No ulcerations or evidence of oropharyngeal candidiasis. Neck is supple.  NODES:  No cervical, supraclavicular, or axillary lymphadenopathy palpated.  BREAST EXAM:  left breast s/p lumpectomy, no sign of local recurrence, right breast benign LUNGS:  Clear to auscultation bilaterally.  No wheezes or rhonchi. HEART:  Regular rate and rhythm. No murmur appreciated. ABDOMEN:  Soft, nontender.  Positive, normoactive bowel sounds. No organomegaly palpated. MSK:  No focal spinal tenderness to palpation. Full range of motion bilaterally in the upper extremities. EXTREMITIES:  No peripheral edema.   SKIN:  Clear with no obvious rashes or skin changes. No nail dyscrasia. NEURO:  Nonfocal. Well oriented.  Appropriate affect.   LABORATORY DATA:  None for this visit.  DIAGNOSTIC IMAGING:  None for this visit.      ASSESSMENT AND PLAN:  Ms.. Poth is a pleasant 87 y.o. female with Stage IA left breast invasive ductal carcinoma, ER+/PR+/HER2-, diagnosed in 08/2020, treated with lumpectomy, and anti-estrogen therapy with Letrozole, discontinued early due to side effects.  She presents to the Survivorship Clinic for our initial meeting and routine follow-up post-completion of treatment for breast cancer.    1. Stage IA left breast cancer:  Ms. Lietz is continuing to recover from definitive treatment for breast cancer. She will follow-up with her medical oncologist, Dr. Lindi Adie in 6 months with history and physical exam per surveillance protocol.  She discontinued antiestrogen therapy early due to adverse effects of the letrozole.  After discussion with Dr. Lindi Adie, they decided to forego her taking  further antiestrogen therapy.  Her mammogram is due 08/2021; orders  placed today. Today, a comprehensive survivorship care plan and treatment summary was reviewed with the patient today detailing her breast cancer diagnosis, treatment course, potential late/long-term effects of treatment, appropriate follow-up care with recommendations for the future, and patient education resources.  A copy of this summary, along with a letter will be sent to the patient's primary care provider via mail/fax/In Basket message after today's visit.    2. Bone health:  Given Ms. Voight's age/history of breast cancer, she is at risk for bone demineralization.  Her last DEXA scan was 12/30/2018, which showed osteoporosis with a T score of -2.8 in the left femur.  She normally follows up with her primary care provider for retesting orders, and recommendations.  She was given education on specific activities to promote bone health.  3. Cancer screening:  Due to Ms. Corona's history and her age, she should receive screening for skin cancers.  The information and recommendations are listed on the patient's comprehensive care plan/treatment summary and were reviewed in detail with the patient.    4. Health maintenance and wellness promotion: Ms. Ricklefs was encouraged to consume 5-7 servings of fruits and vegetables per day. We reviewed the "Nutrition Rainbow" handout.  She was also encouraged to engage in moderate to vigorous exercise for 30 minutes per day most days of the week. We discussed the [employer] fitness program, which is designed for cancer survivors to help them become more physically fit after cancer treatments.  She was instructed to limit her alcohol consumption and continue to abstain from tobacco use.     5. Support services/counseling: It is not uncommon for this period of the patient's cancer care trajectory to be one of many emotions and stressors. She was given information regarding our available services and encouraged to contact me with any questions or for help  enrolling in any of our support group/programs.    Follow up instructions:    -Return to cancer center 6 months for  f/u with Dr. Lindi Adie  -Mammogram due in 08/2021 -Follow up with surgery 1 year -She is welcome to return back to the Survivorship Clinic at any time; no additional follow-up needed at this time.  -Consider referral back to survivorship as a long-term survivor for continued surveillance  The patient was provided an opportunity to ask questions and all were answered. The patient agreed with the plan and demonstrated an understanding of the instructions.   Total encounter time: 30 minutes in face-to-face visit time, chart review, lab review, order entry, care coordination, and documentation of the encounter.  Wilber Bihari, NP 01/16/21 3:04 PM Medical Oncology and Hematology [facility] [place], [place] [address] Tel. [phone]    Fax. [phone]  *Total Encounter Time as defined by the Centers for Medicare and Medicaid Services includes, in addition to the face-to-face time of a patient visit (documented in the note above) non-face-to-face time: obtaining and reviewing outside history, ordering and reviewing medications, tests or procedures, care coordination (communications with other health care professionals or caregivers) and documentation in the medical record.

## 2021-01-16 ENCOUNTER — Encounter: Payer: Self-pay | Admitting: Adult Health

## 2021-01-16 ENCOUNTER — Inpatient Hospital Stay: Payer: Medicare PPO | Attending: Hematology and Oncology | Admitting: Adult Health

## 2021-01-16 ENCOUNTER — Other Ambulatory Visit: Payer: Self-pay

## 2021-01-16 VITALS — BP 165/78 | HR 86 | Temp 97.7°F | Resp 16 | Ht 66.0 in | Wt 127.6 lb

## 2021-01-16 DIAGNOSIS — Z7901 Long term (current) use of anticoagulants: Secondary | ICD-10-CM | POA: Diagnosis not present

## 2021-01-16 DIAGNOSIS — Z170 Estrogen receptor positive status [ER+]: Secondary | ICD-10-CM | POA: Diagnosis not present

## 2021-01-16 DIAGNOSIS — C50412 Malignant neoplasm of upper-outer quadrant of left female breast: Secondary | ICD-10-CM | POA: Diagnosis not present

## 2021-01-17 NOTE — Progress Notes (Signed)
Faxed MM orders to [facility]

## 2021-01-19 ENCOUNTER — Other Ambulatory Visit: Payer: Self-pay | Admitting: *Deleted

## 2021-01-19 ENCOUNTER — Telehealth: Payer: Self-pay | Admitting: *Deleted

## 2021-01-30 ENCOUNTER — Other Ambulatory Visit: Payer: Self-pay | Admitting: Cardiology

## 2021-02-07 ENCOUNTER — Telehealth: Payer: Self-pay | Admitting: Cardiology

## 2021-02-07 NOTE — Telephone Encounter (Signed)
Spoke to patient [name] advised any of our EP Drs. are ok to see.

## 2021-02-07 NOTE — Telephone Encounter (Signed)
   Pt said, he received a letter that Dr. [name] is leaving, she can't decide who's EP doctor she wants to see and would like to ask Dr. Martinique for recommendations

## 2021-02-07 NOTE — Telephone Encounter (Signed)
Routed to Dr. Martinique to review/advise

## 2021-02-28 DIAGNOSIS — J069 Acute upper respiratory infection, unspecified: Secondary | ICD-10-CM | POA: Diagnosis not present

## 2021-02-28 DIAGNOSIS — R6883 Chills (without fever): Secondary | ICD-10-CM | POA: Diagnosis not present

## 2021-02-28 DIAGNOSIS — I130 Hypertensive heart and chronic kidney disease with heart failure and stage 1 through stage 4 chronic kidney disease, or unspecified chronic kidney disease: Secondary | ICD-10-CM | POA: Diagnosis not present

## 2021-02-28 DIAGNOSIS — N1831 Chronic kidney disease, stage 3a: Secondary | ICD-10-CM | POA: Diagnosis not present

## 2021-02-28 DIAGNOSIS — J449 Chronic obstructive pulmonary disease, unspecified: Secondary | ICD-10-CM | POA: Diagnosis not present

## 2021-02-28 DIAGNOSIS — R051 Acute cough: Secondary | ICD-10-CM | POA: Diagnosis not present

## 2021-02-28 DIAGNOSIS — I350 Nonrheumatic aortic (valve) stenosis: Secondary | ICD-10-CM | POA: Diagnosis not present

## 2021-02-28 DIAGNOSIS — I2721 Secondary pulmonary arterial hypertension: Secondary | ICD-10-CM | POA: Diagnosis not present

## 2021-02-28 DIAGNOSIS — R5383 Other fatigue: Secondary | ICD-10-CM | POA: Diagnosis not present

## 2021-03-07 ENCOUNTER — Other Ambulatory Visit: Payer: Self-pay

## 2021-03-07 ENCOUNTER — Ambulatory Visit (INDEPENDENT_AMBULATORY_CARE_PROVIDER_SITE_OTHER): Payer: Medicare PPO

## 2021-03-07 DIAGNOSIS — I495 Sick sinus syndrome: Secondary | ICD-10-CM | POA: Diagnosis not present

## 2021-03-07 MED ORDER — EZETIMIBE 10 MG PO TABS: 10.0000 mg | ORAL_TABLET | Freq: Every day | ORAL | 3 refills | Status: DC

## 2021-03-09 LAB — CUP PACEART REMOTE DEVICE CHECK
Battery Impedance: 283 Ohm
Battery Remaining Longevity: 103 mo
Battery Voltage: 2.78 V
Brady Statistic AP VP Percent: 0 %
Brady Statistic AP VS Percent: 99 %
Brady Statistic AS VP Percent: 0 %
Brady Statistic AS VS Percent: 1 %
Date Time Interrogation Session: 20230105095416
Implantable Lead Implant Date: 20180807
Implantable Lead Implant Date: 20180807
Implantable Lead Location: 753859
Implantable Lead Location: 753860
Implantable Lead Model: 5076
Implantable Lead Model: 5076
Implantable Pulse Generator Implant Date: 20180807
Lead Channel Impedance Value: 359 Ohm
Lead Channel Impedance Value: 480 Ohm
Lead Channel Pacing Threshold Amplitude: 0.75 V
Lead Channel Pacing Threshold Amplitude: 0.75 V
Lead Channel Pacing Threshold Pulse Width: 0.4 ms
Lead Channel Pacing Threshold Pulse Width: 0.4 ms
Lead Channel Setting Pacing Amplitude: 2 V
Lead Channel Setting Pacing Amplitude: 2.5 V
Lead Channel Setting Pacing Pulse Width: 0.4 ms
Lead Channel Setting Sensing Sensitivity: 5.6 mV

## 2021-03-13 ENCOUNTER — Telehealth: Payer: Self-pay | Admitting: Cardiology

## 2021-03-13 NOTE — Telephone Encounter (Signed)
Pt c/o of Chest Pain: STAT if CP now or developed within 24 hours  1. Are you having CP right now?  No   2. Are you experiencing any other symptoms (ex. SOB, nausea, vomiting, sweating)?  Shoulder pain, left hand numbness   3. How long have you been experiencing CP?  Past few days   4. Is your CP continuous or coming and going?  Coming and going  5. Have you taken Nitroglycerin?  No  ?

## 2021-03-13 NOTE — Telephone Encounter (Signed)
Returned the call to the patient. She stated that she has had an intermittent "pinch" on the left side around the pacemaker site and then her left hand will get numb. This may last a few seconds and no longer than a minute. She stated that it just started yesterday and has only occurred 2-3 times. The first time that this occurred, it woke her from her sleep. She denies any other symptoms.   She has been advised that this could be nerve pain since she can feel it tingle down her arm. She wanted to make Dr. Martinique aware and see if he had any recommendations.

## 2021-03-14 NOTE — Telephone Encounter (Signed)
Sounds more muscular. I would just observe.   Tayvian Holycross Martinique MD, [facility]

## 2021-03-14 NOTE — Telephone Encounter (Signed)
Spoke to patient [name]'s advice given.Stated she feels better this morning.No pain around pacemaker.

## 2021-03-16 NOTE — Progress Notes (Signed)
Remote pacemaker transmission.   

## 2021-03-26 ENCOUNTER — Other Ambulatory Visit: Payer: Self-pay | Admitting: Cardiology

## 2021-04-11 DIAGNOSIS — Z7722 Contact with and (suspected) exposure to environmental tobacco smoke (acute) (chronic): Secondary | ICD-10-CM | POA: Diagnosis not present

## 2021-04-11 DIAGNOSIS — J309 Allergic rhinitis, unspecified: Secondary | ICD-10-CM | POA: Diagnosis not present

## 2021-04-11 DIAGNOSIS — R32 Unspecified urinary incontinence: Secondary | ICD-10-CM | POA: Diagnosis not present

## 2021-04-11 DIAGNOSIS — I110 Hypertensive heart disease with heart failure: Secondary | ICD-10-CM | POA: Diagnosis not present

## 2021-04-11 DIAGNOSIS — I495 Sick sinus syndrome: Secondary | ICD-10-CM | POA: Diagnosis not present

## 2021-04-11 DIAGNOSIS — E785 Hyperlipidemia, unspecified: Secondary | ICD-10-CM | POA: Diagnosis not present

## 2021-04-11 DIAGNOSIS — K219 Gastro-esophageal reflux disease without esophagitis: Secondary | ICD-10-CM | POA: Diagnosis not present

## 2021-04-11 DIAGNOSIS — H3530 Unspecified macular degeneration: Secondary | ICD-10-CM | POA: Diagnosis not present

## 2021-04-11 DIAGNOSIS — E261 Secondary hyperaldosteronism: Secondary | ICD-10-CM | POA: Diagnosis not present

## 2021-04-11 DIAGNOSIS — M1990 Unspecified osteoarthritis, unspecified site: Secondary | ICD-10-CM | POA: Diagnosis not present

## 2021-04-11 DIAGNOSIS — Z7409 Other reduced mobility: Secondary | ICD-10-CM | POA: Diagnosis not present

## 2021-04-11 DIAGNOSIS — D6869 Other thrombophilia: Secondary | ICD-10-CM | POA: Diagnosis not present

## 2021-04-11 DIAGNOSIS — E039 Hypothyroidism, unspecified: Secondary | ICD-10-CM | POA: Diagnosis not present

## 2021-04-11 DIAGNOSIS — I4891 Unspecified atrial fibrillation: Secondary | ICD-10-CM | POA: Diagnosis not present

## 2021-04-11 DIAGNOSIS — I25119 Atherosclerotic heart disease of native coronary artery with unspecified angina pectoris: Secondary | ICD-10-CM | POA: Diagnosis not present

## 2021-04-11 DIAGNOSIS — Z7901 Long term (current) use of anticoagulants: Secondary | ICD-10-CM | POA: Diagnosis not present

## 2021-04-11 DIAGNOSIS — I509 Heart failure, unspecified: Secondary | ICD-10-CM | POA: Diagnosis not present

## 2021-04-11 DIAGNOSIS — M545 Low back pain, unspecified: Secondary | ICD-10-CM | POA: Diagnosis not present

## 2021-04-21 NOTE — Progress Notes (Signed)
[name] [name] Date of Birth: 11-18-1929 Medical Record #[id number]  History of Present Illness: [name] [name] is seen today for follow up CAD and AV stenosis.  She has a history of paroxysmal atrial fibrillation with tachy-brady syndrome and is s/p pacemaker implant. She is on chronic anticoagulation with Eliquis. Echo in July 2015 showed moderate aortic stenosis that had progressed since 2012. Repeat in July 2016 and July 2017 showed no change. In April 2017 she had atrial fibrillation that converted spontaneously. This was felt to be triggered by a steroid injection. Her metoprolol was increased at that time. When seen in June 2017 she was noted to be in persistent Afib. We placed her on Flecainide but this was later stopped due to side effects. She was seen in November with recurrent and persistent Afib. Rate was controlled but she was symptomatic. Seen in Afib clinic and started on amiodarone with subsequent conversion to NSR.   She later developed symptoms of dyspnea.   She had PFTs in December that showed mild obstructive defect with marked decrease in diffusion capacity.  Myoview study was normal. When seen by Dr. [name] in late July it was noted that she had failure of her RA lead. Her device was reprogrammed but she developed pectoral stimulation. She underwent explantation of her old generator and placement of a new left chest dual chamber pacemaker on October 09 2016. Because of her ongoing symptoms we recommended a right and left heart cath and this was done on 11/27/16. This demonstrated severe AS as well as a severe stenosis in the mid LAD. She subsequently underwent successful PCI of the LAD with DES on 12/20/16 with 2.75 x 16 mm Promus stent.   She underwent TAVR on November 27,2018  with a #23 Edwards Sapien valve. Post op course complicated by pericarditis and AFib with RVR she was treated with anti-inflammatories and Amiodarone. Later Eliquis resumed and ASA stopped. Still on Plavix for  stent. Follow up Echo was [place]. Repeat Echo 01/22/18 still showed good results. Pacemaker check on 12/12/17 showed normal function without atrial high rate episodes. When seen in the valve clinic on 01/22/18 Plavix was discontinued.   She underwent  left breast lumpectomy with Dr Marlou Starks for newly diagnosed breast mass on 10/04/20. Initially placed on Letrozole but this was discontinued due to side effects.   She has rare palpitations. No chest pain or dyspnea. No edema. Feels well. No dizziness or syncope. No bleeding.   Current Outpatient Medications on File Prior to Visit  Medication Sig Dispense Refill   amiodarone (PACERONE) 200 MG tablet TAKE 1/2 TABLET(100 MG) BY MOUTH DAILY 45 tablet 3   amLODipine (NORVASC) 5 MG tablet TAKE 1 TABLET(5 MG) BY MOUTH DAILY 90 tablet 3   calcium carbonate (OS-CAL) 600 MG TABS Take 600 mg by mouth 2 (two) times daily with a meal.     ELIQUIS 2.5 MG TABS tablet TAKE 1 TABLET(2.5 MG) BY MOUTH TWICE DAILY 180 tablet 1   ezetimibe (ZETIA) 10 MG tablet Take 1 tablet (10 mg total) by mouth daily. TAKE 1 TABLET(10 MG) BY MOUTH DAILY Strength: 10 mg 90 tablet 3   feeding supplement, ENSURE ENLIVE, (ENSURE ENLIVE) LIQD Take 237 mLs by mouth 2 (two) times daily between meals. (Patient taking differently: Take 237 mLs by mouth daily.) 237 mL 12   fluticasone (FLONASE) 50 MCG/ACT nasal spray Place 2 sprays into both nostrils daily as needed for allergies (congestion).     furosemide (LASIX) 20 MG tablet  TAKE 1 TABLET BY MOUTH EVERY DAY; EXCEPT ON MONDAY AND FRIDAY (Patient taking differently: Take 20 mg by mouth every other day.) 90 tablet 3   losartan (COZAAR) 25 MG tablet Take 1 tablet (25 mg total) by mouth daily. 90 tablet 3   metoprolol tartrate (LOPRESSOR) 25 MG tablet TAKE 1 AND 1/2 TABLETS(37.5 MG) BY MOUTH TWICE DAILY (Patient taking differently: Take 37.5 mg by mouth 2 (two) times daily.) 270 tablet 2   nitroGLYCERIN (NITROSTAT) 0.4 MG SL tablet Place 1 tablet (0.4 mg  total) under the tongue every 5 (five) minutes as needed for chest pain. 25 tablet 12   pantoprazole (PROTONIX) 40 MG tablet Take 1 tablet (40 mg total) by mouth 2 (two) times daily before a meal. (Patient taking differently: Take 40 mg by mouth daily.) 180 tablet 3   polyvinyl alcohol (LIQUIFILM TEARS) 1.4 % ophthalmic solution Place 1-2 drops into both eyes 3 (three) times daily as needed for dry eyes.     SYNTHROID 125 MCG tablet Take 125 mcg by mouth daily before breakfast.      Multiple Vitamins-Minerals (PRESERVISION AREDS 2) CAPS Take 1 capsule by mouth 2 (two) times daily. (Patient not taking: Reported on 04/25/2021)     No current facility-administered medications on file prior to visit.    Allergies  Allergen Reactions   Penicillins Anaphylaxis and Other (See Comments)    Has patient had a PCN reaction causing immediate rash, facial/tongue/throat swelling, SOB or lightheadedness with hypotension: Yes Has patient had a PCN reaction causing severe rash involving mucus membranes or skin necrosis: No Has patient had a PCN reaction that required hospitalization: No Has patient had a PCN reaction occurring within the last 10 years: No If all of the above answers are "NO", then may proceed with Cephalosporin use.    Tetanus Toxoid Anaphylaxis   Demerol Nausea Only    Severe nausea   Clindamycin/Lincomycin     Sores in mouth   Letrozole Other (See Comments)   Meningococcal Poly Tetanus Conj Vaccine Acwy Other (See Comments)   Codeine Nausea Only    Severe nausea    Past Medical History:  Diagnosis Date   CKD (chronic kidney disease)    Complication of anesthesia    Coronary artery disease    a. 12/20/2016: s/p DES to LAD    Diverticulosis    Family history of adverse reaction to anesthesia    sister also has post-op n/v   Family history of breast cancer 09/01/2020   GERD (gastroesophageal reflux disease)    Hemorrhoids    Hyperlipidemia    Hypertension    Hypothyroidism     Osteoarthritis    Paroxysmal atrial fibrillation (HCC)    a. on [place] with Xarelto but switched to Eliquis after stenting.    Personal history of skin cancer 09/01/2020   PONV (postoperative nausea and vomiting)    Presence of permanent cardiac pacemaker    S/P TAVR (transcatheter aortic valve replacement) 01/29/2017   23 mm Edwards Sapien 3 transcatheter heart valve placed via percutaneous right transfemoral approach     Past Surgical History:  Procedure Laterality Date   ABDOMINAL HYSTERECTOMY  1980   BASAL CELL CARCINOMA EXCISION     BREAST LUMPECTOMY WITH RADIOACTIVE SEED LOCALIZATION Left 10/04/2020   Procedure: LEFT BREAST LUMPECTOMY WITH RADIOACTIVE SEED LOCALIZATION;  Surgeon: Jovita Kussmaul, MD;  Location: [facility];  Service: General;  Laterality: Left;   CARDIAC CATHETERIZATION  ~ 11/2016   CATARACT EXTRACTION W/  INTRAOCULAR LENS  IMPLANT, BILATERAL Bilateral    CORONARY ANGIOPLASTY WITH STENT PLACEMENT  12/20/2016   "1 stent"   CORONARY STENT INTERVENTION N/A 12/20/2016   Procedure: CORONARY STENT INTERVENTION;  Surgeon: Martinique, Aaidyn San M, MD;  Location: [facility];  Service: Cardiovascular;  Laterality: N/A;   FINGER SURGERY Left    "fell; developed skiers thumb; had to operate on it"   INSERT / REPLACE / [place]   for syptomatic bradycardia and syncope -- in [place] (IM) NAIL INTERTROCHANTERIC Left 10/19/2018   Procedure: INTRAMEDULLARY (IM) NAIL INTERTROCHANTRIC;  Surgeon: Altamese [place], MD;  Location: ;  Service: Orthopedics;  Laterality: Left;   LEAD REVISION/REPAIR N/A 10/09/2016   New left subclavian MDT Adapta L PPM dual chamber system implanted by Dr [name] with previously placed R subclavian system abandoned   PACEMAKER GENERATOR CHANGE  2001   pulse generator replacement by Dr. Pincus Large GENERATOR CHANGE  04/01/2008   PPM Medtronic -- model # ADDRL1 serial # [id number] -- pulse generator replacement by Dr.  Verlon Setting    RIGHT/LEFT [place] N/A 11/27/2016   Procedure: RIGHT/LEFT HEART CATH AND CORONARY ANGIOGRAPHY;  Surgeon: Martinique, Joselynne Killam M, MD;  Location: [facility];  Service: Cardiovascular;  Laterality: N/A;   SQUAMOUS CELL CARCINOMA EXCISION     TEE WITHOUT CARDIOVERSION N/A 01/29/2017   Procedure: TRANSESOPHAGEAL ECHOCARDIOGRAM (TEE);  Surgeon: Sherren Mocha, MD;  Location: [facility];  Service: Open Heart Surgery;  Laterality: N/A;   TONSILLECTOMY     TRANSCATHETER AORTIC VALVE REPLACEMENT, TRANSFEMORAL N/A 01/29/2017   Procedure: TRANSCATHETER AORTIC VALVE REPLACEMENT, TRANSFEMORAL;  Surgeon: Sherren Mocha, MD;  Location: [facility];  Service: Open Heart Surgery;  Laterality: N/A;    Social History   Tobacco Use  Smoking Status Former   Packs/day: 1.50   Years: 30.00   Pack years: 45.00   Types: Cigarettes   Quit date: 03/05/1978   Years since quitting: 43.1  Smokeless Tobacco Never    Social History   Substance and Sexual Activity  Alcohol Use No    Family History  Problem Relation Age of Onset   Alzheimer's disease Mother    Kidney cancer Father        dx 75s   Breast cancer Maternal Aunt 29   Breast cancer Cousin        maternal cousin, dx >50    Review of Systems: As noted in history of present illness.  All other systems were reviewed and are negative.  Physical Exam: BP 120/60 (BP Location: Left Arm, Patient Position: Sitting)    Pulse 68    Ht 5\' 6"  (1.676 m)    Wt 124 lb 6.4 oz (56.4 kg)    SpO2 95%    BMI 20.08 kg/m  GENERAL:  Well appearing, elderly WF in NAD. Walks with cane HEENT:  PERRL, EOMI, sclera are clear. Oropharynx is clear. NECK:  No jugular venous distention, carotid upstroke brisk and symmetric, no bruits, no thyromegaly or adenopathy LUNGS:  Clear to auscultation bilaterally CHEST:  Unremarkable HEART:  RRR,  PMI not displaced or sustained,S1 and S2 within normal limits, no S3, no S4: no clicks, no rubs, very soft  systolic murmur at the apex. ABD:  Soft, nontender. BS +, no masses or bruits. No hepatomegaly, no splenomegaly EXT:  2 + pulses throughout, tr edema, no cyanosis no clubbing SKIN:  Warm and dry.  No rashes NEURO:  Alert and oriented x 3. Cranial nerves II through XII intact. PSYCH:  Cognitively intact   LABORATORY DATA:  Lab Results  Component Value Date   WBC 6.1 12/16/2020   HGB 13.4 12/16/2020   HCT 41.3 12/16/2020   PLT 166 12/16/2020   GLUCOSE 114 (H) 12/16/2020   CHOL 219 (H) 02/05/2018   TRIG 70 02/05/2018   HDL 103 02/05/2018   LDLCALC 102 (H) 02/05/2018   ALT 12 08/31/2020   AST 23 08/31/2020   NA 137 12/16/2020   K 4.5 12/16/2020   CL 100 12/16/2020   CREATININE 1.24 (H) 12/16/2020   BUN 38 (H) 12/16/2020   CO2 29 12/16/2020   TSH 4.080 06/07/2020   INR 1.23 01/29/2017   HGBA1C 5.5 01/28/2017   Labs dated 07/15/15: cholesterol 229, triglycerides 111, HDL 76, LDL 131.  Dated 07/19/16: cholesterol 247, triglycerides 71, HDL 88, LDL 145. BUN 28, creatinine 1.3. Other chemistries, CBC and TSH normal. Dated 03/15/17: creatinine 1.7, Hgb 11.3. Dated 10/22/17: cholesterol 251, triglycerides 66, HDL 93, LDL 145. Hgb 11.7, creatinine 1.3. TSH and ALT normal. Dated 01/12/19: cholesterol 224, triglycerides 105, HDL 96, LDL 107.  Dated 06/15/19: BUN 36, creatinine 1.5. CMET otherwise normal. CBC and TSH normal. Dated 12/26/20: cholesterol 199, triglycerides 51, HDL 90, LDL 99.   Echo 07/26/20: 1. Left ventricular ejection fraction, by estimation, is 60 to 65%. The left ventricle has normal function. The left ventricle has no regional wall motion abnormalities. Left ventricular diastolic parameters are indeterminate. 2. Right ventricular systolic function is normal. The right ventricular size is normal. 3. Mild mitral valve regurgitation. 4. Tricuspid valve regurgitation is moderate. 5. S/p TAVR (01/29/17) 23 mm Edwards bioprosthesis. Peak and mean gradients through the valve  are 17 and 8 mm Hg repcectively AVA (VTI 1.53 cm2) No significant change from gradients in echo of 01/22/18 . Aortic valve regurgitation is mild. Left Ventricle: Left ventricular ejection fraction, by estimation, is 60 to 65%. The left ventricle has normal function. The left ventricle has no regional wall motion abnormalities. The left ventricular internal cavity size was normal in size. There is no left ventricular hypertrophy. Left ventricular diastolic parameters are indeterminate. Right Ventricle: The right ventricular size is normal. Right vetricular wall thickness was not assessed. Right ventricular systolic function is normal. Left Atrium: Left atrial size was normal in size. Right Atrium: Right atrial size was normal in size. Pericardium: There is no evidence of pericardial effusion. Mitral Valve: There is mild thickening of the mitral valve leaflet(s). Mild mitral annular calcification. Mild  Assessment / Plan: 1. Atrial fibrillation with sick sinus syndrome. Prior history of  persistent AFib.  Now on amiodarone 100 mg daily. Maintaining NSR.  Continue long-term anticoagulation. On  Eliquis 2.5 mg bid.  Dose adjusted for age and renal function. Labs are up to date.   2. Aortic stenosis-   symptomatic. Now s/p TAVR. Stable exam. Echo May 2022 looked good.   3. CAD with significant single vessel obstructive disease in the mid LAD. S/p PCI of the LAD with DES October 2018.  On Eliquis. No ASA due to bleeding risk.  4. HTN- well controlled.  Will continue metoprolol Bid, take losartan in the am. Amlodipine in the pm.   5. Hyperlipidemia. Intolerant of statins. Now on Zetia 10 mg daily.   6. Chronic diastolic CHF. She is well compensated. Only mild swelling in am.   7. S/p femoral neck fracture with ORIF. Recovered.   8. Breast CA. S/p lumpectomy.  Monitored by oncology. Did not tolerate antiestrogen therapy.   Follow up in 6 months.

## 2021-04-25 ENCOUNTER — Other Ambulatory Visit: Payer: Self-pay

## 2021-04-25 ENCOUNTER — Ambulatory Visit: Payer: Medicare PPO | Admitting: Cardiology

## 2021-04-25 ENCOUNTER — Encounter: Payer: Self-pay | Admitting: Cardiology

## 2021-04-25 VITALS — BP 120/60 | HR 68 | Ht 66.0 in | Wt 124.4 lb

## 2021-04-25 DIAGNOSIS — Z952 Presence of prosthetic heart valve: Secondary | ICD-10-CM | POA: Diagnosis not present

## 2021-04-25 DIAGNOSIS — I5032 Chronic diastolic (congestive) heart failure: Secondary | ICD-10-CM

## 2021-04-25 DIAGNOSIS — I2510 Atherosclerotic heart disease of native coronary artery without angina pectoris: Secondary | ICD-10-CM

## 2021-04-25 DIAGNOSIS — I480 Paroxysmal atrial fibrillation: Secondary | ICD-10-CM | POA: Diagnosis not present

## 2021-04-25 DIAGNOSIS — I495 Sick sinus syndrome: Secondary | ICD-10-CM

## 2021-04-26 DIAGNOSIS — C50412 Malignant neoplasm of upper-outer quadrant of left female breast: Secondary | ICD-10-CM | POA: Diagnosis not present

## 2021-04-26 DIAGNOSIS — Z170 Estrogen receptor positive status [ER+]: Secondary | ICD-10-CM | POA: Diagnosis not present

## 2021-04-28 ENCOUNTER — Telehealth: Payer: Self-pay | Admitting: Hematology and Oncology

## 2021-04-28 NOTE — Telephone Encounter (Signed)
Sch per 2/21 inbasket, left msg

## 2021-05-08 NOTE — Progress Notes (Signed)
? ?Patient Care Team: ?[name], MD as PCP - General (Internal Medicine) ?Martinique, Peter M, MD as PCP - Cardiology (Cardiology) ?Thompson Grayer, MD as PCP - Electrophysiology (Cardiology) ?Gery Pray, MD as Consulting Physician (Radiation Oncology) ?Nicholas Lose, MD as Consulting Physician (Hematology and Oncology) ?Jovita Kussmaul, MD as Consulting Physician (General Surgery) ? ?DIAGNOSIS:  ?  ICD-10-CM   ?1. Malignant neoplasm of upper-outer quadrant of left breast in female, estrogen receptor positive ([place])  C50.412   ? Z17.0   ?  ? ? ?SUMMARY OF ONCOLOGIC HISTORY: ?Oncology History  ?Malignant neoplasm of upper-outer quadrant of left breast in female, estrogen receptor positive ([place])  ?08/26/2020 Initial Diagnosis  ? Screening detected left breast mass 0.8 cm, axilla negative, biopsy revealed grade 1 IDC ER 95%, PR 1%, HER2 equivocal by IHC, FISH negative, Ki-67 5% ?  ?08/31/2020 Cancer Staging  ? Staging form: Breast, AJCC 8th Edition ?- Clinical stage from 08/31/2020: Stage IA (cT1b, cN0, cM0, G1, ER+, PR+, HER2-) - Signed by Nicholas Lose, MD on 08/31/2020 ?Stage prefix: Initial diagnosis ?Histologic grading system: 3 grade system ? ?  ?09/13/2020 Genetic Testing  ? Negative.  Genes Tested include: APC, ATM, AXIN2, BARD1, BMPR1A, BRCA1, BRCA2, BRIP1, CDH1, CDK4, CDKN2A, CHEK2, DICER1, EPCAM, GREM1, HOXB13, MEN1, MLH1, MSH2, MSH3, MSH6, MUTYH, NBN, NF1, NF2, NTHL1, PALB2, PMS2, POLD1, POLE, PTEN, RAD51C, RAD51D, RECQL, RET, SDHA, SDHAF2, SDHB, SDHC, SDHD, SMAD4, SMARCA4, STK11, TP53, TSC1, TSC2, and VHL.  ?  ?10/04/2020 Surgery  ? Left lumpectomy (Dr. Jovita Kussmaul): 0.9 cm, grade 1 invasive ductal carcinoma with resection margins negative for carcinoma ?  ?10/04/2020 Cancer Staging  ? Staging form: Breast, AJCC 8th Edition ?- Pathologic stage from 10/04/2020: Stage IA (pT1b, pN0, cM0, G1, ER+, PR+, HER2-) - Signed by Gardenia Phlegm, NP on 01/15/2021 ?Stage prefix: Initial diagnosis ?Histologic grading  system: 3 grade system ? ?  ? ? ?CHIEF COMPLIANT: Follow-up of left breast cancer ? ?INTERVAL HISTORY: [name] [name] is a 88 y.o. with above-mentioned history of left breast cancer having undergone lumpectomy. She reports to the clinic to day for follow-up.  She stopped letrozole therapy because of vaginal discharge as well some other problems.  Since then she is doing much better without any further issues with vaginal discharge.  She has intermittent sharp jabs of pain in the breast and sometimes pain when she lays on her left side. ? ?ALLERGIES:  is allergic to penicillins, tetanus toxoid, demerol, clindamycin/lincomycin, letrozole, meningococcal poly tetanus conj vaccine acwy, and codeine. ? ?MEDICATIONS:  ?Current Outpatient Medications  ?Medication Sig Dispense Refill  ? amiodarone (PACERONE) 200 MG tablet TAKE 1/2 TABLET(100 MG) BY MOUTH DAILY 45 tablet 3  ? amLODipine (NORVASC) 5 MG tablet TAKE 1 TABLET(5 MG) BY MOUTH DAILY 90 tablet 3  ? calcium carbonate (OS-CAL) 600 MG TABS Take 600 mg by mouth 2 (two) times daily with a meal.    ? ELIQUIS 2.5 MG TABS tablet TAKE 1 TABLET(2.5 MG) BY MOUTH TWICE DAILY 180 tablet 1  ? ezetimibe (ZETIA) 10 MG tablet Take 1 tablet (10 mg total) by mouth daily. TAKE 1 TABLET(10 MG) BY MOUTH DAILY Strength: 10 mg 90 tablet 3  ? feeding supplement, ENSURE ENLIVE, (ENSURE ENLIVE) LIQD Take 237 mLs by mouth 2 (two) times daily between meals. (Patient taking differently: Take 237 mLs by mouth daily.) 237 mL 12  ? fluticasone (FLONASE) 50 MCG/ACT nasal spray Place 2 sprays into both nostrils daily as needed for allergies (congestion).    ?  furosemide (LASIX) 20 MG tablet TAKE 1 TABLET BY MOUTH EVERY DAY; EXCEPT ON MONDAY AND FRIDAY (Patient taking differently: Take 20 mg by mouth every other day.) 90 tablet 3  ? losartan (COZAAR) 25 MG tablet Take 1 tablet (25 mg total) by mouth daily. 90 tablet 3  ? metoprolol tartrate (LOPRESSOR) 25 MG tablet TAKE 1 AND 1/2 TABLETS(37.5 MG) BY  MOUTH TWICE DAILY (Patient taking differently: Take 37.5 mg by mouth 2 (two) times daily.) 270 tablet 2  ? Multiple Vitamins-Minerals (PRESERVISION AREDS 2) CAPS Take 1 capsule by mouth 2 (two) times daily. (Patient not taking: Reported on 04/25/2021)    ? nitroGLYCERIN (NITROSTAT) 0.4 MG SL tablet Place 1 tablet (0.4 mg total) under the tongue every 5 (five) minutes as needed for chest pain. 25 tablet 12  ? pantoprazole (PROTONIX) 40 MG tablet Take 1 tablet (40 mg total) by mouth 2 (two) times daily before a meal. (Patient taking differently: Take 40 mg by mouth daily.) 180 tablet 3  ? polyvinyl alcohol (LIQUIFILM TEARS) 1.4 % ophthalmic solution Place 1-2 drops into both eyes 3 (three) times daily as needed for dry eyes.    ? SYNTHROID 125 MCG tablet Take 125 mcg by mouth daily before breakfast.     ? ?No current facility-administered medications for this visit.  ? ? ?PHYSICAL EXAMINATION: ?ECOG PERFORMANCE STATUS: 1 - Symptomatic but completely ambulatory ? ?Vitals:  ? 05/09/21 1019  ?BP: (!) 113/57  ?Pulse: 86  ?Resp: 18  ?Temp: (!) 97.5 ?F (36.4 ?C)  ?SpO2: 98%  ? ?Filed Weights  ? 05/09/21 1019  ?Weight: 124 lb 6.4 oz (56.4 kg)  ? ? ?BREAST: No palpable masses or nodules in either right or left breasts. No palpable axillary supraclavicular or infraclavicular adenopathy no breast tenderness or nipple discharge. (exam performed in the presence of a chaperone) ? ?LABORATORY DATA:  ?I have reviewed the data as listed ?CMP Latest Ref Rng & Units 12/16/2020 09/27/2020 08/31/2020  ?Glucose 70 - 99 mg/dL 114(H) 98 96  ?BUN 8 - 23 mg/dL 38(H) 33(H) 39(H)  ?Creatinine 0.44 - 1.00 mg/dL 1.24(H) 1.25(H) 1.27(H)  ?Sodium 135 - 145 mmol/L 137 134(L) 139  ?Potassium 3.5 - 5.1 mmol/L 4.5 4.6 4.6  ?Chloride 98 - 111 mmol/L 100 98 100  ?CO2 22 - 32 mmol/L _0 ?Calcium 8.9 - 10.3 mg/dL 9.9 9.8 9.8  ?Total Protein 6.5 - 8.1 g/dL - - 6.5  ?Total Bilirubin 0.3 - 1.2 mg/dL - - 1.2  ?Alkaline Phos 38 - 126 U/L - - 66  ?AST 15 - 41  U/L - - 23  ?ALT 0 - 44 U/L - - 12  ? ? ?Lab Results  ?Component Value Date  ? WBC 6.1 12/16/2020  ? HGB 13.4 12/16/2020  ? HCT 41.3 12/16/2020  ? MCV 105.4 (H) 12/16/2020  ? PLT 166 12/16/2020  ? NEUTROABS 4.2 08/31/2020  ? ? ?ASSESSMENT & PLAN:  ?Malignant neoplasm of upper-outer quadrant of left breast in female, estrogen receptor positive ([place]) ?June 2022:Screening detected left breast mass 0.8 cm, axilla negative, biopsy revealed grade 1 IDC ER 95%, PR 1%, HER2 equivocal by IHC, FISH negative, Ki-67 5% ?  ?10/04/2020:Left lumpectomy (Dr. Autumn Messing): 0.9 cm, grade 1 invasive ductal carcinoma with resection margins negative for carcinoma ?  ?Treatment plan: ?1.  Given the favorable type of breast cancer and the size and negative margins, we decided not to pursue radiation. ?2.  Adjuvant antiestrogen therapy with letrozole 2.5 mg  daily (stopped because of side effects) ?We decided not to prescribe her any other antiestrogen treatments given her age and her side effects to letrozole. ?  ?Breast Cancer Surveillance: ?1. Breast exam  05/09/2021: Normal ?2. Mammogram to be done June 2023 ? ?Return to clinic in 1 year for follow-up ? ? ? ?No orders of the defined types were placed in this encounter. ? ?The patient has a good understanding of the overall plan. she agrees with it. she will call with any problems that may develop before the next visit here. ? ?Total time spent: 30 mins including face to face time and time spent for planning, charting and coordination of care ? ?Rulon Eisenmenger, MD, MPH ?05/09/2021 ? ?I, Thana Ates, am acting as scribe for Dr. Nicholas Lose. ? ?I have reviewed the above documentation for accuracy and completeness, and I agree with the above. ? ? ? ? ? ? ?

## 2021-05-09 ENCOUNTER — Inpatient Hospital Stay: Payer: Medicare PPO | Attending: Hematology and Oncology | Admitting: Hematology and Oncology

## 2021-05-09 ENCOUNTER — Other Ambulatory Visit: Payer: Self-pay

## 2021-05-09 DIAGNOSIS — Z7901 Long term (current) use of anticoagulants: Secondary | ICD-10-CM | POA: Insufficient documentation

## 2021-05-09 DIAGNOSIS — Z79899 Other long term (current) drug therapy: Secondary | ICD-10-CM | POA: Diagnosis not present

## 2021-05-09 DIAGNOSIS — Z170 Estrogen receptor positive status [ER+]: Secondary | ICD-10-CM

## 2021-05-09 DIAGNOSIS — C50412 Malignant neoplasm of upper-outer quadrant of left female breast: Secondary | ICD-10-CM | POA: Diagnosis not present

## 2021-05-09 NOTE — Assessment & Plan Note (Signed)
June 2022:Screening detected left breast mass 0.8 cm, axilla negative, biopsy revealed grade 1 IDC ER 95%, PR 1%, HER2 equivocal by IHC, FISH negative, Ki-67 5% ?? ?10/04/2020:Left lumpectomy (Dr. Autumn Messing): 0.9 cm, grade 1 invasive ductal carcinoma with resection margins negative for carcinoma ?? ?Treatment plan: ?1.??Given the favorable type of breast cancer and the size and negative margins, we decided not to pursue radiation. ?2.??Adjuvant antiestrogen therapy?with letrozole 2.5 mg daily (stopped because of side effects) ?? ?Letrozole toxicities: Insomnia and brown vaginal discharge requiring discontinuation of letrozole therapy. ?I discussed the pros and cons of switching her to a different medication but given her very favorable prognosis and her advanced age, we decided not to pursue any further antiestrogen treatment ?? ?Breast Cancer Surveillance: ?1. Breast exam  05/09/2021: Normal ?2. Mammogram to be done June 2023 ? ?Return to clinic in 1 year for follow-up ?

## 2021-05-22 ENCOUNTER — Other Ambulatory Visit: Payer: Self-pay

## 2021-05-22 MED ORDER — LOSARTAN POTASSIUM 25 MG PO TABS: 25.0000 mg | ORAL_TABLET | Freq: Every day | ORAL | 3 refills | Status: DC

## 2021-05-29 ENCOUNTER — Telehealth: Payer: Self-pay | Admitting: Cardiology

## 2021-05-29 MED ORDER — METOPROLOL TARTRATE 25 MG PO TABS: 37.5000 mg | ORAL_TABLET | Freq: Every day | ORAL | 2 refills | Status: DC

## 2021-05-29 NOTE — Telephone Encounter (Signed)
?*  STAT* If patient is at the pharmacy, call can be transferred to refill team.   1. Which medications need to be refilled? (please list name of each medication and dose if known) metoprolol tartrate (LOPRESSOR) 25 MG tablet   2. Which pharmacy/location (including street and city if local pharmacy) is medication to be sent to? [id number] - [place],  - [address]   3. Do they need a 30 day or 90 day supply? 90 day    

## 2021-05-31 ENCOUNTER — Other Ambulatory Visit: Payer: Self-pay

## 2021-05-31 ENCOUNTER — Telehealth: Payer: Self-pay | Admitting: Cardiology

## 2021-05-31 MED ORDER — METOPROLOL TARTRATE 25 MG PO TABS: 37.5000 mg | ORAL_TABLET | Freq: Every day | ORAL | 2 refills | Status: DC

## 2021-05-31 NOTE — Telephone Encounter (Signed)
?*  STAT* If patient is at the pharmacy, call can be transferred to refill team. ? ? ?1. Which medications need to be refilled? (please list name of each medication and dose if known) Metoprolol ? ?2. Which pharmacy/location (including street and city if local pharmacy) is medication to be sent to? [place], [place] ? ?3. Do they need a 30 day or 90 day supply? 90 days and refills ? ?

## 2021-05-31 NOTE — Telephone Encounter (Signed)
Refill for Metoprolol was sent to [facility] 05/29/21 but did not go through due to sig error. I re-sent refill to pharmacy. ?

## 2021-06-06 ENCOUNTER — Ambulatory Visit (INDEPENDENT_AMBULATORY_CARE_PROVIDER_SITE_OTHER): Payer: Medicare PPO

## 2021-06-06 DIAGNOSIS — I495 Sick sinus syndrome: Secondary | ICD-10-CM

## 2021-06-07 LAB — CUP PACEART REMOTE DEVICE CHECK
Battery Impedance: 332 Ohm
Battery Remaining Longevity: 95 mo
Battery Voltage: 2.77 V
Brady Statistic AP VP Percent: 0 %
Brady Statistic AP VS Percent: 99 %
Brady Statistic AS VP Percent: 0 %
Brady Statistic AS VS Percent: 1 %
Date Time Interrogation Session: 20230404140724
Implantable Lead Implant Date: 20180807
Implantable Lead Implant Date: 20180807
Implantable Lead Location: 753859
Implantable Lead Location: 753860
Implantable Lead Model: 5076
Implantable Lead Model: 5076
Implantable Pulse Generator Implant Date: 20180807
Lead Channel Impedance Value: 314 Ohm
Lead Channel Impedance Value: 468 Ohm
Lead Channel Pacing Threshold Amplitude: 0.75 V
Lead Channel Pacing Threshold Amplitude: 0.75 V
Lead Channel Pacing Threshold Pulse Width: 0.4 ms
Lead Channel Pacing Threshold Pulse Width: 0.4 ms
Lead Channel Setting Pacing Amplitude: 2 V
Lead Channel Setting Pacing Amplitude: 2.5 V
Lead Channel Setting Pacing Pulse Width: 0.4 ms
Lead Channel Setting Sensing Sensitivity: 5.6 mV

## 2021-06-14 ENCOUNTER — Encounter (HOSPITAL_COMMUNITY): Payer: Self-pay

## 2021-06-20 NOTE — Progress Notes (Signed)
Remote pacemaker transmission.   

## 2021-06-23 DIAGNOSIS — H353132 Nonexudative age-related macular degeneration, bilateral, intermediate dry stage: Secondary | ICD-10-CM | POA: Diagnosis not present

## 2021-06-26 NOTE — Progress Notes (Signed)
? ?Patient Care Team: ?[name], MD as PCP - General (Internal Medicine) ?Martinique, Peter M, MD as PCP - Cardiology (Cardiology) ?Thompson Grayer, MD as PCP - Electrophysiology (Cardiology) ?Gery Pray, MD as Consulting Physician (Radiation Oncology) ?Nicholas Lose, MD as Consulting Physician (Hematology and Oncology) ?Jovita Kussmaul, MD as Consulting Physician (General Surgery) ? ?DIAGNOSIS:  ?Encounter Diagnosis  ?Name Primary?  ? Malignant neoplasm of upper-outer quadrant of left breast in female, estrogen receptor positive ([place])   ? ? ?SUMMARY OF ONCOLOGIC HISTORY: ?Oncology History  ?Malignant neoplasm of upper-outer quadrant of left breast in female, estrogen receptor positive ([place])  ?08/26/2020 Initial Diagnosis  ? Screening detected left breast mass 0.8 cm, axilla negative, biopsy revealed grade 1 IDC ER 95%, PR 1%, HER2 equivocal by IHC, FISH negative, Ki-67 5% ?  ?08/31/2020 Cancer Staging  ? Staging form: Breast, AJCC 8th Edition ?- Clinical stage from 08/31/2020: Stage IA (cT1b, cN0, cM0, G1, ER+, PR+, HER2-) - Signed by Nicholas Lose, MD on 08/31/2020 ?Stage prefix: Initial diagnosis ?Histologic grading system: 3 grade system ? ?  ?09/13/2020 Genetic Testing  ? Negative.  Genes Tested include: APC, ATM, AXIN2, BARD1, BMPR1A, BRCA1, BRCA2, BRIP1, CDH1, CDK4, CDKN2A, CHEK2, DICER1, EPCAM, GREM1, HOXB13, MEN1, MLH1, MSH2, MSH3, MSH6, MUTYH, NBN, NF1, NF2, NTHL1, PALB2, PMS2, POLD1, POLE, PTEN, RAD51C, RAD51D, RECQL, RET, SDHA, SDHAF2, SDHB, SDHC, SDHD, SMAD4, SMARCA4, STK11, TP53, TSC1, TSC2, and VHL.  ?  ?10/04/2020 Surgery  ? Left lumpectomy (Dr. Jovita Kussmaul): 0.9 cm, grade 1 invasive ductal carcinoma with resection margins negative for carcinoma ?  ?10/04/2020 Cancer Staging  ? Staging form: Breast, AJCC 8th Edition ?- Pathologic stage from 10/04/2020: Stage IA (pT1b, pN0, cM0, G1, ER+, PR+, HER2-) - Signed by Gardenia Phlegm, NP on 01/15/2021 ?Stage prefix: Initial diagnosis ?Histologic grading  system: 3 grade system ? ?  ? ? ?CHIEF COMPLIANT:  Follow-up of left breast cancer ? ?INTERVAL HISTORY: [name] [name] is a 88 y.o. with above-mentioned history of left breast cancer having undergone lumpectomy. She reports to the clinic to day for follow-up.  She denies any problems or concerns.  She feels well and has been able to get around with some assistance.  She uses a cane to walk around. ? ? ?ALLERGIES:  is allergic to penicillins, tetanus toxoid, demerol, clindamycin/lincomycin, letrozole, meningococcal poly tetanus conj vaccine acwy, and codeine. ? ?MEDICATIONS:  ?Current Outpatient Medications  ?Medication Sig Dispense Refill  ? amiodarone (PACERONE) 200 MG tablet TAKE 1/2 TABLET(100 MG) BY MOUTH DAILY 45 tablet 3  ? amLODipine (NORVASC) 5 MG tablet TAKE 1 TABLET(5 MG) BY MOUTH DAILY 90 tablet 3  ? calcium carbonate (OS-CAL) 600 MG TABS Take 600 mg by mouth 2 (two) times daily with a meal.    ? ELIQUIS 2.5 MG TABS tablet TAKE 1 TABLET(2.5 MG) BY MOUTH TWICE DAILY 180 tablet 1  ? ezetimibe (ZETIA) 10 MG tablet Take 1 tablet (10 mg total) by mouth daily. TAKE 1 TABLET(10 MG) BY MOUTH DAILY Strength: 10 mg 90 tablet 3  ? feeding supplement, ENSURE ENLIVE, (ENSURE ENLIVE) LIQD Take 237 mLs by mouth 2 (two) times daily between meals. (Patient taking differently: Take 237 mLs by mouth daily.) 237 mL 12  ? fluticasone (FLONASE) 50 MCG/ACT nasal spray Place 2 sprays into both nostrils daily as needed for allergies (congestion).    ? furosemide (LASIX) 20 MG tablet TAKE 1 TABLET BY MOUTH EVERY DAY; EXCEPT ON MONDAY AND FRIDAY (Patient taking differently: Take 20  mg by mouth every other day.) 90 tablet 3  ? losartan (COZAAR) 25 MG tablet Take 1 tablet (25 mg total) by mouth daily. 90 tablet 3  ? metoprolol tartrate (LOPRESSOR) 25 MG tablet Take 1.5 tablets (37.5 mg total) by mouth daily. TAKE 1 AND 1/2 TABLETS (37.5 MG) BY MOUTH TWICE DAILY Strength: 25 mg 135 tablet 2  ? Multiple Vitamins-Minerals (PRESERVISION  AREDS 2) CAPS Take 1 capsule by mouth 2 (two) times daily. (Patient not taking: Reported on 04/25/2021)    ? nitroGLYCERIN (NITROSTAT) 0.4 MG SL tablet Place 1 tablet (0.4 mg total) under the tongue every 5 (five) minutes as needed for chest pain. 25 tablet 12  ? pantoprazole (PROTONIX) 40 MG tablet Take 1 tablet (40 mg total) by mouth 2 (two) times daily before a meal. (Patient taking differently: Take 40 mg by mouth daily.) 180 tablet 3  ? polyvinyl alcohol (LIQUIFILM TEARS) 1.4 % ophthalmic solution Place 1-2 drops into both eyes 3 (three) times daily as needed for dry eyes.    ? SYNTHROID 125 MCG tablet Take 125 mcg by mouth daily before breakfast.     ? ?No current facility-administered medications for this visit.  ? ? ?PHYSICAL EXAMINATION: ?ECOG PERFORMANCE STATUS: 1 - Symptomatic but completely ambulatory ? ?Vitals:  ? 07/10/21 0933  ?BP: (!) 129/50  ?Pulse: 94  ?Resp: 18  ?Temp: (!) 97.3 ?F (36.3 ?C)  ?SpO2: 99%  ? ?Filed Weights  ? 07/10/21 0933  ?Weight: 122 lb 9.6 oz (55.6 kg)  ? ? ?BREAST: No palpable masses or nodules in either right or left breasts. No palpable axillary supraclavicular or infraclavicular adenopathy no breast tenderness or nipple discharge. (exam performed in the presence of a chaperone) ? ?LABORATORY DATA:  ?I have reviewed the data as listed ? ?  Latest Ref Rng & Units 12/16/2020  ?  4:04 PM 09/27/2020  ?  2:49 PM 08/31/2020  ? 12:46 PM  ?CMP  ?Glucose 70 - 99 mg/dL 114   98   96    ?BUN 8 - 23 mg/dL 38   33   39    ?Creatinine 0.44 - 1.00 mg/dL 1.24   1.25   1.27    ?Sodium 135 - 145 mmol/L 137   134   139    ?Potassium 3.5 - 5.1 mmol/L 4.5   4.6   4.6    ?Chloride 98 - 111 mmol/L 100   98   100    ?CO2 22 - 32 mmol/L _0 ?Calcium 8.9 - 10.3 mg/dL 9.9   9.8   9.8    ?Total Protein 6.5 - 8.1 g/dL   6.5    ?Total Bilirubin 0.3 - 1.2 mg/dL   1.2    ?Alkaline Phos 38 - 126 U/L   66    ?AST 15 - 41 U/L   23    ?ALT 0 - 44 U/L   12    ? ? ?Lab Results  ?Component Value Date  ?  WBC 6.1 12/16/2020  ? HGB 13.4 12/16/2020  ? HCT 41.3 12/16/2020  ? MCV 105.4 (H) 12/16/2020  ? PLT 166 12/16/2020  ? NEUTROABS 4.2 08/31/2020  ? ? ?ASSESSMENT & PLAN:  ?Malignant neoplasm of upper-outer quadrant of left breast in female, estrogen receptor positive ([place]) ?June 2022:Screening detected left breast mass 0.8 cm, axilla negative, biopsy revealed grade 1 IDC ER 95%, PR 1%, HER2 equivocal by IHC, FISH negative, Ki-67  5% ?  ?10/04/2020:Left lumpectomy (Dr. Autumn Messing): 0.9 cm, grade 1 invasive ductal carcinoma with resection margins negative for carcinoma ?  ?Treatment plan: ?1.  Given the favorable type of breast cancer and the size and negative margins, we decided not to pursue radiation. ?2.  Adjuvant antiestrogen therapy with letrozole 2.5 mg daily (stopped because of side effects) ?We decided not to prescribe her any other antiestrogen treatments given her age and her side effects to letrozole. ?  ?Breast Cancer Surveillance: ?1. Breast exam  05/09/2021: Normal ?2. Mammogram to be done August 2023 ?  ?Return to clinic in 1 year for follow-up ? ? ? ?No orders of the defined types were placed in this encounter. ? ?The patient has a good understanding of the overall plan. she agrees with it. she will call with any problems that may develop before the next visit here. ?Total time spent: 30 mins including face to face time and time spent for planning, charting and co-ordination of care ? ? Harriette Ohara, MD ?07/10/21 ? ? ? I Gardiner Coins am scribing for Dr. Lindi Adie ? ?I have reviewed the above documentation for accuracy and completeness, and I agree with the above. ?  ?

## 2021-07-03 ENCOUNTER — Telehealth: Payer: Self-pay | Admitting: Hematology and Oncology

## 2021-07-03 NOTE — Telephone Encounter (Signed)
Called patient regarding upcoming appointments, patient is notified. 

## 2021-07-10 ENCOUNTER — Other Ambulatory Visit: Payer: Self-pay

## 2021-07-10 ENCOUNTER — Inpatient Hospital Stay: Payer: Medicare PPO | Attending: Hematology and Oncology | Admitting: Hematology and Oncology

## 2021-07-10 DIAGNOSIS — Z79899 Other long term (current) drug therapy: Secondary | ICD-10-CM | POA: Diagnosis not present

## 2021-07-10 DIAGNOSIS — C50412 Malignant neoplasm of upper-outer quadrant of left female breast: Secondary | ICD-10-CM | POA: Diagnosis not present

## 2021-07-10 DIAGNOSIS — Z7901 Long term (current) use of anticoagulants: Secondary | ICD-10-CM | POA: Diagnosis not present

## 2021-07-10 DIAGNOSIS — Z170 Estrogen receptor positive status [ER+]: Secondary | ICD-10-CM | POA: Diagnosis not present

## 2021-07-10 DIAGNOSIS — Z79811 Long term (current) use of aromatase inhibitors: Secondary | ICD-10-CM | POA: Insufficient documentation

## 2021-07-10 NOTE — Assessment & Plan Note (Signed)
June 2022:Screening detected left breast mass 0.8 cm, axilla negative, biopsy revealed grade 1 IDC ER 95%, PR 1%, HER2 equivocal by IHC, FISH negative, Ki-67 5% ?? ?10/04/2020:Left lumpectomy (Dr. Autumn Messing): 0.9 cm, grade 1 invasive ductal carcinoma with resection margins negative for carcinoma ?? ?Treatment plan: ?1.??Given the favorable type of breast cancer and the size and negative margins, we decided not to pursue radiation. ?2.??Adjuvant antiestrogen therapy?with letrozole 2.5 mg daily?(stopped because of side effects) ?We decided not to prescribe her any other antiestrogen treatments given her age and her side effects to letrozole. ?? ?Breast Cancer Surveillance: ?1. Breast exam? 05/09/2021: Normal ?2. Mammogram?to be done June 2023 ?? ?Return to clinic in 1 year for follow-up ?

## 2021-07-13 DIAGNOSIS — E785 Hyperlipidemia, unspecified: Secondary | ICD-10-CM | POA: Diagnosis not present

## 2021-07-13 DIAGNOSIS — Z7901 Long term (current) use of anticoagulants: Secondary | ICD-10-CM | POA: Diagnosis not present

## 2021-07-13 DIAGNOSIS — J449 Chronic obstructive pulmonary disease, unspecified: Secondary | ICD-10-CM | POA: Diagnosis not present

## 2021-07-13 DIAGNOSIS — D692 Other nonthrombocytopenic purpura: Secondary | ICD-10-CM | POA: Diagnosis not present

## 2021-07-13 DIAGNOSIS — I130 Hypertensive heart and chronic kidney disease with heart failure and stage 1 through stage 4 chronic kidney disease, or unspecified chronic kidney disease: Secondary | ICD-10-CM | POA: Diagnosis not present

## 2021-07-13 DIAGNOSIS — E039 Hypothyroidism, unspecified: Secondary | ICD-10-CM | POA: Diagnosis not present

## 2021-07-13 DIAGNOSIS — N1831 Chronic kidney disease, stage 3a: Secondary | ICD-10-CM | POA: Diagnosis not present

## 2021-07-13 DIAGNOSIS — M810 Age-related osteoporosis without current pathological fracture: Secondary | ICD-10-CM | POA: Diagnosis not present

## 2021-07-13 DIAGNOSIS — I2721 Secondary pulmonary arterial hypertension: Secondary | ICD-10-CM | POA: Diagnosis not present

## 2021-07-17 ENCOUNTER — Ambulatory Visit: Payer: Medicare PPO | Admitting: Hematology and Oncology

## 2021-07-24 ENCOUNTER — Other Ambulatory Visit: Payer: Self-pay | Admitting: Cardiology

## 2021-08-16 ENCOUNTER — Other Ambulatory Visit: Payer: Self-pay | Admitting: Cardiology

## 2021-09-12 NOTE — Progress Notes (Signed)
Electrophysiology Office Note Date: 09/19/2021  ID:  [name] [name], [name] [name] [birth year], MRN [id number]  PCP: [name], MD Primary Cardiologist: Peter Martinique, MD Electrophysiologist: Thompson Grayer, MD   CC: Pacemaker follow-up  [name] [name] is a 88 y.o. female seen today for Thompson Grayer, MD for routine electrophysiology followup.  Since last being seen in our clinic the patient reports doing well overall.  she denies chest pain, palpitations, dyspnea, PND, orthopnea, nausea, vomiting, dizziness, syncope, edema, weight gain, or early satiety.  Device History: Medtronic Dual Chamber PPM implanted 10/2016 for SSS/Tachy/Brady  Past Medical History:  Diagnosis Date   CKD (chronic kidney disease)    Complication of anesthesia    Coronary artery disease    a. 12/20/2016: s/p DES to LAD    Diverticulosis    Family history of adverse reaction to anesthesia    sister also has post-op n/v   Family history of breast cancer 09/01/2020   GERD (gastroesophageal reflux disease)    Hemorrhoids    Hyperlipidemia    Hypertension    Hypothyroidism    Osteoarthritis    Paroxysmal atrial fibrillation ([place])    a. on [place] with Xarelto but switched to Eliquis after stenting.    Personal history of skin cancer 09/01/2020   PONV (postoperative nausea and vomiting)    Presence of permanent cardiac pacemaker    S/P TAVR (transcatheter aortic valve replacement) 01/29/2017   23 mm Edwards Sapien 3 transcatheter heart valve placed via percutaneous right transfemoral approach    Past Surgical History:  Procedure Laterality Date   ABDOMINAL HYSTERECTOMY  1980   BASAL CELL CARCINOMA EXCISION     BREAST LUMPECTOMY WITH RADIOACTIVE SEED LOCALIZATION Left 10/04/2020   Procedure: LEFT BREAST LUMPECTOMY WITH RADIOACTIVE SEED LOCALIZATION;  Surgeon: Jovita Kussmaul, MD;  Location: [facility];  Service: General;  Laterality: Left;   CARDIAC CATHETERIZATION  ~ 11/2016   CATARACT EXTRACTION W/ INTRAOCULAR LENS  IMPLANT,  BILATERAL Bilateral    CORONARY ANGIOPLASTY WITH STENT PLACEMENT  12/20/2016   "1 stent"   CORONARY STENT INTERVENTION N/A 12/20/2016   Procedure: CORONARY STENT INTERVENTION;  Surgeon: Martinique, Peter M, MD;  Location: [facility];  Service: Cardiovascular;  Laterality: N/A;   FINGER SURGERY Left    "fell; developed skiers thumb; had to operate on it"   INSERT / REPLACE / [place]   for syptomatic bradycardia and syncope -- in [place] (IM) NAIL INTERTROCHANTERIC Left 10/19/2018   Procedure: INTRAMEDULLARY (IM) NAIL INTERTROCHANTRIC;  Surgeon: Altamese Bayard, MD;  Location: [facility];  Service: Orthopedics;  Laterality: Left;   LEAD REVISION/REPAIR N/A 10/09/2016   New left subclavian MDT Adapta L PPM dual chamber system implanted by Dr [name] with previously placed R subclavian system abandoned   PACEMAKER GENERATOR CHANGE  2001   pulse generator replacement by Dr. Pincus Large GENERATOR CHANGE  04/01/2008   PPM Medtronic -- model # ADDRL1 serial # [id number] -- pulse generator replacement by Dr. Verlon Setting    RIGHT/LEFT [place] N/A 11/27/2016   Procedure: RIGHT/LEFT HEART CATH AND CORONARY ANGIOGRAPHY;  Surgeon: Martinique, Peter M, MD;  Location: [facility];  Service: Cardiovascular;  Laterality: N/A;   SQUAMOUS CELL CARCINOMA EXCISION     TEE WITHOUT CARDIOVERSION N/A 01/29/2017   Procedure: TRANSESOPHAGEAL ECHOCARDIOGRAM (TEE);  Surgeon: Sherren Mocha, MD;  Location: [facility];  Service: Open Heart Surgery;  Laterality: N/A;  TONSILLECTOMY     TRANSCATHETER AORTIC VALVE REPLACEMENT, TRANSFEMORAL N/A 01/29/2017   Procedure: TRANSCATHETER AORTIC VALVE REPLACEMENT, TRANSFEMORAL;  Surgeon: Sherren Mocha, MD;  Location: [facility];  Service: Open Heart Surgery;  Laterality: N/A;    Current Outpatient Medications  Medication Sig Dispense Refill   amiodarone (PACERONE) 200 MG tablet TAKE 1/2 TABLET(100 MG) BY MOUTH DAILY 45  tablet 3   amLODipine (NORVASC) 5 MG tablet TAKE 1 TABLET(5 MG) BY MOUTH DAILY 90 tablet 3   apixaban (ELIQUIS) 2.5 MG TABS tablet Take 1 tablet (2.5 mg total) by mouth 2 (two) times daily. 180 tablet 3   calcium carbonate (OS-CAL) 600 MG TABS Take 600 mg by mouth 2 (two) times daily with a meal.     ezetimibe (ZETIA) 10 MG tablet Take 1 tablet (10 mg total) by mouth daily. TAKE 1 TABLET(10 MG) BY MOUTH DAILY Strength: 10 mg 90 tablet 3   feeding supplement, ENSURE ENLIVE, (ENSURE ENLIVE) LIQD Take 237 mLs by mouth 2 (two) times daily between meals. (Patient taking differently: Take 237 mLs by mouth daily.) 237 mL 12   fluticasone (FLONASE) 50 MCG/ACT nasal spray Place 2 sprays into both nostrils daily as needed for allergies (congestion).     furosemide (LASIX) 20 MG tablet TAKE 1 TABLET BY MOUTH EVERY DAY. EXCEPT ON MONDAY AND FRIDAY 90 tablet 3   losartan (COZAAR) 25 MG tablet Take 1 tablet (25 mg total) by mouth daily. 90 tablet 3   metoprolol tartrate (LOPRESSOR) 25 MG tablet Take 1.5 tablets (37.5 mg total) by mouth daily. TAKE 1 AND 1/2 TABLETS (37.5 MG) BY MOUTH TWICE DAILY Strength: 25 mg (Patient taking differently: Take 37.5 mg by mouth 2 (two) times daily. TAKE 1 AND 1/2 TABLETS (37.5 MG) BY MOUTH TWICE DAILY Strength: 25 mg) 135 tablet 2   Multiple Vitamins-Minerals (PRESERVISION AREDS 2) CAPS Take 1 capsule by mouth 2 (two) times daily.     nitroGLYCERIN (NITROSTAT) 0.4 MG SL tablet Place 1 tablet (0.4 mg total) under the tongue every 5 (five) minutes as needed for chest pain. 25 tablet 12   pantoprazole (PROTONIX) 40 MG tablet Take 1 tablet (40 mg total) by mouth 2 (two) times daily before a meal. (Patient taking differently: Take 40 mg by mouth daily.) 180 tablet 3   polyvinyl alcohol (LIQUIFILM TEARS) 1.4 % ophthalmic solution Place 1-2 drops into both eyes 3 (three) times daily as needed for dry eyes.     SYNTHROID 125 MCG tablet Take 125 mcg by mouth daily before breakfast.      No  current facility-administered medications for this visit.    Allergies:   Penicillins, Tetanus toxoid, Demerol, Clindamycin/lincomycin, Letrozole, Meningococcal poly tetanus conj vaccine acwy, and Codeine   Social History: Social History   Socioeconomic History   Marital status: Widowed    Spouse name: Not on file   Number of children: 0   Years of education: Not on file   Highest education level: Not on file  Occupational History    Employer: RETIRED  Tobacco Use   Smoking status: Former    Packs/day: 1.50    Years: 30.00    Total pack years: 45.00    Types: Cigarettes    Quit date: 03/05/1978    Years since quitting: 43.5   Smokeless tobacco: Never  Vaping Use   Vaping Use: Never used  Substance and Sexual Activity   Alcohol use: No   Drug use: No   Sexual activity: Not on file  Other Topics Concern   Not on file  Social History Narrative   Not on file   Social Determinants of Health   Financial Resource Strain: Low Risk  (01/16/2021)   Overall Financial Resource Strain (CARDIA)    Difficulty of Paying Living Expenses: Not hard at all  Food Insecurity: No Food Insecurity (01/16/2021)   Hunger Vital Sign    Worried About Running Out of Food in the Last Year: Never true    Ran Out of Food in the Last Year: Never true  Transportation Needs: No Transportation Needs (01/16/2021)   PRAPARE - Hydrologist (Medical): No    Lack of Transportation (Non-Medical): No  Physical Activity: Insufficiently Active (01/16/2021)   Exercise Vital Sign    Days of Exercise per Week: 2 days    Minutes of Exercise per Session: 30 min  Stress: No Stress Concern Present (01/16/2021)   [place]    Feeling of Stress : Not at all  Social Connections: Moderately Isolated (01/16/2021)   Social Connection and Isolation Panel [NHANES]    Frequency of Communication with Friends and Family: Three  times a week    Frequency of Social Gatherings with Friends and Family: Twice a week    Attends Religious Services: 1 to 4 times per year    Active Member of [employer] or Organizations: No    Attends Archivist Meetings: Never    Marital Status: Widowed  Intimate Partner Violence: Not At Risk (01/16/2021)   Humiliation, Afraid, Rape, and Kick questionnaire    Fear of Current or Ex-Partner: No    Emotionally Abused: No    Physically Abused: No    Sexually Abused: No    Family History: Family History  Problem Relation Age of Onset   Alzheimer's disease Mother    Kidney cancer Father        dx 16s   Breast cancer Maternal Aunt 34   Breast cancer Cousin        maternal cousin, dx >50     Review of Systems: All other systems reviewed and are otherwise negative except as noted above.  Physical Exam: Vitals:   09/19/21 1014  BP: 112/60  Pulse: 69  SpO2: 94%  Weight: 124 lb 3.2 oz (56.3 kg)  Height: '5\' 6"'$  (1.676 m)     GEN- The patient is well appearing, alert and oriented x 3 today.   HEENT: normocephalic, atraumatic; sclera clear, conjunctiva pink; hearing intact; oropharynx clear; neck supple  Lungs- Clear to ausculation bilaterally, normal work of breathing.  No wheezes, rales, rhonchi Heart- Regular rate and rhythm, no murmurs, rubs or gallops  GI- soft, non-tender, non-distended, bowel sounds present  Extremities- no clubbing or cyanosis. No edema MS- no significant deformity or atrophy Skin- warm and dry, no rash or lesion; PPM pocket well healed Psych- euthymic mood, full affect Neuro- strength and sensation are intact  PPM Interrogation- reviewed in detail today,  See PACEART report  EKG:  EKG is not ordered today. Personal review of ekg ordered  12/2020  showed A pacing at 75 bpm   Recent Labs: 12/16/2020: BUN 38; Creatinine, Ser 1.24; Hemoglobin 13.4; Platelets 166; Potassium 4.5; Sodium 137   Wt Readings from Last 3 Encounters:  09/19/21 124 lb 3.2  oz (56.3 kg)  07/10/21 122 lb 9.6 oz (55.6 kg)  05/09/21 124 lb 6.4 oz (56.4 kg)     Other studies Reviewed: Additional  studies/ records that were reviewed today include: Previous EP office notes, Previous remote checks, Most recent labwork.   Assessment and Plan:  1. Symptomatic bradycardia s/p Medtronic PPM  Normal PPM function See Pace Art report No changes today  2. Persistent afib Maintaining sinus with amiodarone '100mg'$  daily Surveillance labs today.  We discussed the importance of close monitoring on amiodarone to avoid toxicity.  She has previously been offered to stop (No afib in over a year by [facility] interrogation) or reduce to '50mg'$  daily, but prefers to continue current therapy  Continue eliquis for CHA2DS2VASC of at least 6.     3. Hypertensive cardiovascular disease Continue current medications.    4. S/p TAVR Stable   5. CAD Denies ischemic symptoms.    6. HL Continue Zetia.    7. Chronic diastolic dysfunction Volume status stable on exam.   Current medicines are reviewed at length with the patient today.    Disposition:   Follow up with Dr. Curt Bears in 6 months to establish.     Jacalyn Lefevre, PA-C  09/19/2021 10:23 AM  [facility] [address] [place]  [address] [phone] (office) [phone] (fax)

## 2021-09-19 ENCOUNTER — Ambulatory Visit (INDEPENDENT_AMBULATORY_CARE_PROVIDER_SITE_OTHER): Payer: Medicare PPO | Admitting: Student

## 2021-09-19 ENCOUNTER — Encounter: Payer: Self-pay | Admitting: Student

## 2021-09-19 VITALS — BP 112/60 | HR 69 | Ht 66.0 in | Wt 124.2 lb

## 2021-09-19 DIAGNOSIS — I5032 Chronic diastolic (congestive) heart failure: Secondary | ICD-10-CM

## 2021-09-19 DIAGNOSIS — I4819 Other persistent atrial fibrillation: Secondary | ICD-10-CM

## 2021-09-19 DIAGNOSIS — I2510 Atherosclerotic heart disease of native coronary artery without angina pectoris: Secondary | ICD-10-CM

## 2021-09-19 DIAGNOSIS — I495 Sick sinus syndrome: Secondary | ICD-10-CM

## 2021-09-19 DIAGNOSIS — Z952 Presence of prosthetic heart valve: Secondary | ICD-10-CM | POA: Diagnosis not present

## 2021-09-19 DIAGNOSIS — I10 Essential (primary) hypertension: Secondary | ICD-10-CM

## 2021-09-19 DIAGNOSIS — E785 Hyperlipidemia, unspecified: Secondary | ICD-10-CM | POA: Diagnosis not present

## 2021-09-19 DIAGNOSIS — N390 Urinary tract infection, site not specified: Secondary | ICD-10-CM | POA: Diagnosis not present

## 2021-09-19 LAB — CUP PACEART INCLINIC DEVICE CHECK
Battery Impedance: 382 Ohm
Battery Remaining Longevity: 92 mo
Battery Voltage: 2.77 V
Brady Statistic AP VP Percent: 0 %
Brady Statistic AP VS Percent: 99 %
Brady Statistic AS VP Percent: 0 %
Brady Statistic AS VS Percent: 1 %
Date Time Interrogation Session: 20230718114348
Implantable Lead Implant Date: 20180807
Implantable Lead Implant Date: 20180807
Implantable Lead Location: 753859
Implantable Lead Location: 753860
Implantable Lead Model: 5076
Implantable Lead Model: 5076
Implantable Pulse Generator Implant Date: 20180807
Lead Channel Impedance Value: 330 Ohm
Lead Channel Impedance Value: 489 Ohm
Lead Channel Pacing Threshold Amplitude: 0.75 V
Lead Channel Pacing Threshold Amplitude: 0.75 V
Lead Channel Pacing Threshold Amplitude: 0.75 V
Lead Channel Pacing Threshold Amplitude: 1.25 V
Lead Channel Pacing Threshold Pulse Width: 0.4 ms
Lead Channel Pacing Threshold Pulse Width: 0.4 ms
Lead Channel Pacing Threshold Pulse Width: 0.4 ms
Lead Channel Pacing Threshold Pulse Width: 0.4 ms
Lead Channel Sensing Intrinsic Amplitude: 15.67 mV
Lead Channel Setting Pacing Amplitude: 2 V
Lead Channel Setting Pacing Amplitude: 2.5 V
Lead Channel Setting Pacing Pulse Width: 0.4 ms
Lead Channel Setting Sensing Sensitivity: 5.6 mV

## 2021-09-19 NOTE — Patient Instructions (Signed)
Medication Instructions:  Your physician recommends that you continue on your current medications as directed. Please refer to the Current Medication list given to you today.  *If you need a refill on your cardiac medications before your next appointment, please call your pharmacy*   Lab Work: TODAY: CMET, TSH, FreeT4  If you have labs (blood work) drawn today and your tests are completely normal, you will receive your results only by: [place] (if you have MyChart) OR A paper copy in the mail If you have any lab test that is abnormal or we need to change your treatment, we will call you to review the results.   Follow-Up: At [facility], you and your health needs are our priority.  As part of our continuing mission to provide you with exceptional heart care, we have created designated Provider Care Teams.  These Care Teams include your primary Cardiologist (physician) and Advanced Practice Providers (APPs -  Physician Assistants and Nurse Practitioners) who all work together to provide you with the care you need, when you need it.  We recommend signing up for the patient portal called "MyChart".  Sign up information is provided on this After Visit Summary.  MyChart is used to connect with patients for Virtual Visits (Telemedicine).  Patients are able to view lab/test results, encounter notes, upcoming appointments, etc.  Non-urgent messages can be sent to your provider as well.   To learn more about what you can do with MyChart, go to [email or web address].    Your next appointment:   1 year(s)  The format for your next appointment:   In Person  Provider:   Allegra Lai, MD{

## 2021-09-20 ENCOUNTER — Other Ambulatory Visit: Payer: Self-pay | Admitting: Cardiology

## 2021-09-20 LAB — COMPREHENSIVE METABOLIC PANEL
ALT: 15 IU/L (ref 0–32)
AST: 27 IU/L (ref 0–40)
Albumin/Globulin Ratio: 2 (ref 1.2–2.2)
Albumin: 4.3 g/dL (ref 3.6–4.6)
Alkaline Phosphatase: 88 IU/L (ref 44–121)
BUN/Creatinine Ratio: 21 (ref 12–28)
BUN: 34 mg/dL (ref 10–36)
Bilirubin Total: 0.7 mg/dL (ref 0.0–1.2)
CO2: 27 mmol/L (ref 20–29)
Calcium: 10.1 mg/dL (ref 8.7–10.3)
Chloride: 97 mmol/L (ref 96–106)
Creatinine, Ser: 1.63 mg/dL — ABNORMAL HIGH (ref 0.57–1.00)
Globulin, Total: 2.1 g/dL (ref 1.5–4.5)
Glucose: 74 mg/dL (ref 70–99)
Potassium: 4.6 mmol/L (ref 3.5–5.2)
Sodium: 137 mmol/L (ref 134–144)
Total Protein: 6.4 g/dL (ref 6.0–8.5)
eGFR: 30 mL/min/{1.73_m2} — ABNORMAL LOW (ref >59)

## 2021-09-20 LAB — T4, FREE: Free T4: 1.75 ng/dL (ref 0.82–1.77)

## 2021-09-20 LAB — TSH: TSH: 3.89 u[IU]/mL (ref 0.450–4.500)

## 2021-09-21 ENCOUNTER — Telehealth: Payer: Self-pay

## 2021-09-21 MED ORDER — PANTOPRAZOLE SODIUM 40 MG PO TBEC: 40.0000 mg | DELAYED_RELEASE_TABLET | Freq: Two times a day (BID) | ORAL | 3 refills | Status: AC

## 2021-09-21 NOTE — Telephone Encounter (Signed)
Spoke to patient she stated she needs a protonix refill.Refill sent to pharmacy.

## 2021-09-21 NOTE — Telephone Encounter (Signed)
Spoke to patient she stated she needs protonix refill.Refill sent to pharmacy.

## 2021-09-22 DIAGNOSIS — R627 Adult failure to thrive: Secondary | ICD-10-CM | POA: Diagnosis not present

## 2021-09-22 DIAGNOSIS — I130 Hypertensive heart and chronic kidney disease with heart failure and stage 1 through stage 4 chronic kidney disease, or unspecified chronic kidney disease: Secondary | ICD-10-CM | POA: Diagnosis not present

## 2021-09-22 DIAGNOSIS — I5030 Unspecified diastolic (congestive) heart failure: Secondary | ICD-10-CM | POA: Diagnosis not present

## 2021-09-22 DIAGNOSIS — I480 Paroxysmal atrial fibrillation: Secondary | ICD-10-CM | POA: Diagnosis not present

## 2021-09-22 DIAGNOSIS — M1990 Unspecified osteoarthritis, unspecified site: Secondary | ICD-10-CM | POA: Diagnosis not present

## 2021-09-22 DIAGNOSIS — R5383 Other fatigue: Secondary | ICD-10-CM | POA: Diagnosis not present

## 2021-09-22 DIAGNOSIS — R350 Frequency of micturition: Secondary | ICD-10-CM | POA: Diagnosis not present

## 2021-10-05 DIAGNOSIS — Z853 Personal history of malignant neoplasm of breast: Secondary | ICD-10-CM | POA: Diagnosis not present

## 2021-10-05 DIAGNOSIS — R928 Other abnormal and inconclusive findings on diagnostic imaging of breast: Secondary | ICD-10-CM | POA: Diagnosis not present

## 2021-10-06 ENCOUNTER — Other Ambulatory Visit: Payer: Self-pay | Admitting: Cardiology

## 2021-10-17 DIAGNOSIS — L578 Other skin changes due to chronic exposure to nonionizing radiation: Secondary | ICD-10-CM | POA: Diagnosis not present

## 2021-10-17 DIAGNOSIS — L821 Other seborrheic keratosis: Secondary | ICD-10-CM | POA: Diagnosis not present

## 2021-10-17 DIAGNOSIS — L570 Actinic keratosis: Secondary | ICD-10-CM | POA: Diagnosis not present

## 2021-10-17 DIAGNOSIS — C44529 Squamous cell carcinoma of skin of other part of trunk: Secondary | ICD-10-CM | POA: Diagnosis not present

## 2021-10-17 DIAGNOSIS — D485 Neoplasm of uncertain behavior of skin: Secondary | ICD-10-CM | POA: Diagnosis not present

## 2021-10-17 DIAGNOSIS — Z85828 Personal history of other malignant neoplasm of skin: Secondary | ICD-10-CM | POA: Diagnosis not present

## 2021-10-19 ENCOUNTER — Telehealth: Payer: Self-pay | Admitting: Cardiology

## 2021-10-19 ENCOUNTER — Ambulatory Visit (INDEPENDENT_AMBULATORY_CARE_PROVIDER_SITE_OTHER): Payer: Medicare PPO

## 2021-10-19 DIAGNOSIS — I495 Sick sinus syndrome: Secondary | ICD-10-CM | POA: Diagnosis not present

## 2021-10-19 NOTE — Telephone Encounter (Signed)
Patient called stating she received a letter that her device reading was not received on 09/06/21.  Patient stated she manually transmitted on 7/5 and again this morning (8/17).  Caller would like a call back to confirm reading has been received.

## 2021-10-20 LAB — CUP PACEART REMOTE DEVICE CHECK
Battery Impedance: 407 Ohm
Battery Remaining Longevity: 90 mo
Battery Voltage: 2.77 V
Brady Statistic AP VP Percent: 0 %
Brady Statistic AP VS Percent: 100 %
Brady Statistic AS VP Percent: 0 %
Brady Statistic AS VS Percent: 0 %
Date Time Interrogation Session: 20230817100642
Implantable Lead Implant Date: 20180807
Implantable Lead Implant Date: 20180807
Implantable Lead Location: 753859
Implantable Lead Location: 753860
Implantable Lead Model: 5076
Implantable Lead Model: 5076
Implantable Pulse Generator Implant Date: 20180807
Lead Channel Impedance Value: 342 Ohm
Lead Channel Impedance Value: 485 Ohm
Lead Channel Pacing Threshold Amplitude: 0.625 V
Lead Channel Pacing Threshold Amplitude: 0.75 V
Lead Channel Pacing Threshold Pulse Width: 0.4 ms
Lead Channel Pacing Threshold Pulse Width: 0.4 ms
Lead Channel Setting Pacing Amplitude: 2 V
Lead Channel Setting Pacing Amplitude: 2.5 V
Lead Channel Setting Pacing Pulse Width: 0.4 ms
Lead Channel Setting Sensing Sensitivity: 5.6 mV

## 2021-10-23 ENCOUNTER — Ambulatory Visit: Payer: Medicare PPO | Admitting: Cardiology

## 2021-10-23 DIAGNOSIS — S9032XA Contusion of left foot, initial encounter: Secondary | ICD-10-CM | POA: Diagnosis not present

## 2021-10-30 DIAGNOSIS — Z170 Estrogen receptor positive status [ER+]: Secondary | ICD-10-CM | POA: Diagnosis not present

## 2021-10-30 DIAGNOSIS — C50412 Malignant neoplasm of upper-outer quadrant of left female breast: Secondary | ICD-10-CM | POA: Diagnosis not present

## 2021-10-31 ENCOUNTER — Encounter: Payer: Self-pay | Admitting: Nurse Practitioner

## 2021-10-31 ENCOUNTER — Ambulatory Visit: Payer: Medicare PPO | Attending: Cardiology | Admitting: Nurse Practitioner

## 2021-10-31 VITALS — BP 108/60 | HR 84 | Ht 66.0 in | Wt 120.6 lb

## 2021-10-31 DIAGNOSIS — C50412 Malignant neoplasm of upper-outer quadrant of left female breast: Secondary | ICD-10-CM

## 2021-10-31 DIAGNOSIS — Z952 Presence of prosthetic heart valve: Secondary | ICD-10-CM

## 2021-10-31 DIAGNOSIS — I2510 Atherosclerotic heart disease of native coronary artery without angina pectoris: Secondary | ICD-10-CM

## 2021-10-31 DIAGNOSIS — I350 Nonrheumatic aortic (valve) stenosis: Secondary | ICD-10-CM

## 2021-10-31 DIAGNOSIS — I10 Essential (primary) hypertension: Secondary | ICD-10-CM | POA: Diagnosis not present

## 2021-10-31 DIAGNOSIS — I495 Sick sinus syndrome: Secondary | ICD-10-CM

## 2021-10-31 DIAGNOSIS — Z950 Presence of cardiac pacemaker: Secondary | ICD-10-CM

## 2021-10-31 DIAGNOSIS — Z170 Estrogen receptor positive status [ER+]: Secondary | ICD-10-CM

## 2021-10-31 DIAGNOSIS — I5032 Chronic diastolic (congestive) heart failure: Secondary | ICD-10-CM | POA: Diagnosis not present

## 2021-10-31 DIAGNOSIS — E785 Hyperlipidemia, unspecified: Secondary | ICD-10-CM

## 2021-10-31 DIAGNOSIS — I480 Paroxysmal atrial fibrillation: Secondary | ICD-10-CM

## 2021-10-31 NOTE — Patient Instructions (Signed)
Medication Instructions:  Your physician recommends that you continue on your current medications as directed. Please refer to the Current Medication list given to you today.   *If you need a refill on your cardiac medications before your next appointment, please call your pharmacy*   Lab Work: NONE ordered at this time of appointment   If you have labs (blood work) drawn today and your tests are completely normal, you will receive your results only by: [place] (if you have MyChart) OR A paper copy in the mail If you have any lab test that is abnormal or we need to change your treatment, we will call you to review the results.   Testing/Procedures: NONE ordered at this time of appointment     Follow-Up: At [facility], you and your health needs are our priority.  As part of our continuing mission to provide you with exceptional heart care, we have created designated Provider Care Teams.  These Care Teams include your primary Cardiologist (physician) and Advanced Practice Providers (APPs -  Physician Assistants and Nurse Practitioners) who all work together to provide you with the care you need, when you need it.  We recommend signing up for the patient portal called "MyChart".  Sign up information is provided on this After Visit Summary.  MyChart is used to connect with patients for Virtual Visits (Telemedicine).  Patients are able to view lab/test results, encounter notes, upcoming appointments, etc.  Non-urgent messages can be sent to your provider as well.   To learn more about what you can do with MyChart, go to [email or web address].    Your next appointment:   6 month(s)  The format for your next appointment:   In Person  Provider:   Peter Martinique, MD     Other Instructions   Important Information About Sugar

## 2021-10-31 NOTE — Progress Notes (Signed)
Office Visit    Patient Name: [name] [name] Date of Encounter: 10/31/2021  Primary Care Provider:  [name], MD Primary Cardiologist:  Peter Martinique, MD  Chief Complaint    88 year old female with a history of CAD s/p DES-LAD in 2018, paroxysmal atrial fibrillation, tachy-brady syndrome s/p PPM, aortic stenosis s/p TAVR in 84/6659, chronic diastolic heart failure, hypertension, hyperlipidemia, and breast cancer s/p lumpectomy who presents for follow-up related to CAD, hypertension and AS.   Past Medical History    Past Medical History:  Diagnosis Date   CKD (chronic kidney disease)    Complication of anesthesia    Coronary artery disease    a. 12/20/2016: s/p DES to LAD    Diverticulosis    Family history of adverse reaction to anesthesia    sister also has post-op n/v   Family history of breast cancer 09/01/2020   GERD (gastroesophageal reflux disease)    Hemorrhoids    Hyperlipidemia    Hypertension    Hypothyroidism    Osteoarthritis    Paroxysmal atrial fibrillation (HCC)    a. on [place] with Xarelto but switched to Eliquis after stenting.    Personal history of skin cancer 09/01/2020   PONV (postoperative nausea and vomiting)    Presence of permanent cardiac pacemaker    S/P TAVR (transcatheter aortic valve replacement) 01/29/2017   23 mm Edwards Sapien 3 transcatheter heart valve placed via percutaneous right transfemoral approach    Past Surgical History:  Procedure Laterality Date   ABDOMINAL HYSTERECTOMY  1980   BASAL CELL CARCINOMA EXCISION     BREAST LUMPECTOMY WITH RADIOACTIVE SEED LOCALIZATION Left 10/04/2020   Procedure: LEFT BREAST LUMPECTOMY WITH RADIOACTIVE SEED LOCALIZATION;  Surgeon: Jovita Kussmaul, MD;  Location: [facility];  Service: General;  Laterality: Left;   CARDIAC CATHETERIZATION  ~ 11/2016   CATARACT EXTRACTION W/ INTRAOCULAR LENS  IMPLANT, BILATERAL Bilateral    CORONARY ANGIOPLASTY WITH STENT PLACEMENT  12/20/2016   "1 stent"   CORONARY STENT  INTERVENTION N/A 12/20/2016   Procedure: CORONARY STENT INTERVENTION;  Surgeon: Martinique, Peter M, MD;  Location: [facility];  Service: Cardiovascular;  Laterality: N/A;   FINGER SURGERY Left    "fell; developed skiers thumb; had to operate on it"   INSERT / REPLACE / [place]   for syptomatic bradycardia and syncope -- in [place] ([place]) NAIL INTERTROCHANTERIC Left 10/19/2018   Procedure: INTRAMEDULLARY ([place]) NAIL INTERTROCHANTRIC;  Surgeon: Altamese Delleker, MD;  Location: [facility];  Service: Orthopedics;  Laterality: Left;   LEAD REVISION/REPAIR N/A 10/09/2016   New left subclavian MDT Adapta L PPM dual chamber system implanted by Dr [name] with previously placed R subclavian system abandoned   PACEMAKER GENERATOR CHANGE  2001   pulse generator replacement by Dr. Pincus Large GENERATOR CHANGE  04/01/2008   PPM Medtronic -- model # ADDRL1 serial # [id number] -- pulse generator replacement by Dr. Verlon Setting    RIGHT/LEFT [place] N/A 11/27/2016   Procedure: RIGHT/LEFT HEART CATH AND CORONARY ANGIOGRAPHY;  Surgeon: Martinique, Peter M, MD;  Location: [facility];  Service: Cardiovascular;  Laterality: N/A;   SQUAMOUS CELL CARCINOMA EXCISION     TEE WITHOUT CARDIOVERSION N/A 01/29/2017   Procedure: TRANSESOPHAGEAL ECHOCARDIOGRAM (TEE);  Surgeon: Sherren Mocha, MD;  Location: [facility];  Service: Open Heart Surgery;  Laterality: N/A;   TONSILLECTOMY     TRANSCATHETER AORTIC VALVE REPLACEMENT, TRANSFEMORAL N/A 01/29/2017  Procedure: TRANSCATHETER AORTIC VALVE REPLACEMENT, TRANSFEMORAL;  Surgeon: Sherren Mocha, MD;  Location: [facility];  Service: Open Heart Surgery;  Laterality: N/A;    Allergies  Allergies  Allergen Reactions   Penicillins Anaphylaxis and Other (See Comments)    Has patient had a PCN reaction causing immediate rash, facial/tongue/throat swelling, SOB or lightheadedness with hypotension: Yes Has patient had a PCN  reaction causing severe rash involving mucus membranes or skin necrosis: No Has patient had a PCN reaction that required hospitalization: No Has patient had a PCN reaction occurring within the last 10 years: No If all of the above answers are "NO", then may proceed with Cephalosporin use.    Tetanus Toxoid Anaphylaxis   Demerol Nausea Only    Severe nausea   Clindamycin/Lincomycin     Sores in mouth   Letrozole Other (See Comments)   Meningococcal Poly Tetanus Conj Vaccine [place]wy Other (See Comments)   Codeine Nausea Only    Severe nausea    History of Present Illness    88 year old female with the above past medical history including CAD s/p DES-LAD in 2018, paroxysmal atrial fibrillation, tachy-brady syndrome s/p PPM, aortic stenosis s/p TAVR in 40/9811, chronic diastolic heart failure, hypertension, hyperlipidemia, and breast cancer s/p lumpectomy.  She has a history of paroxysmal atrial fibrillation with SSS s/p PPM, on chronic anticoagulation with Eliquis.  Echocardiogram in July 2015 showed moderate aortic stenosis that have progressed since 2012.  Repeat echo in July 2016 in July 2017 showed no change.  Recurrence of atrial fibrillation in June 2017 and was started on flecainide.  However, this was later discontinued due to side effects.  She was later started on amiodarone due to recurrence of atrial fibrillation with subsequent conversion to NSR.  PFTs in December 2017 mild obstructive defect with marked decrease in diffusion capacity.  Myoview at the time was normal.  She had RA lead failure of PPM in July 2018 and underwent explantation of old generator and placement of new left chest dual-chamber PPM in August 2018. Cardiac catheterization in 2018 revealed severe AS, severe mLAD stenosis s/p DES.  She underwent TAVR in 01/2017 with a #23 [place] valve.  Post-op course was complicated by pericarditis and A-fib with RVR.  Most recent echo in 07/2020 showed EF 60 to 65%, normal LV  function, no RWMA, indeterminate diastolic parameters, normal RV systolic function, mild mitral valve regurgitation, stable functioning AV prosthesis, no change from prior echo.  PPM remote device check showed normal device function, stable battery status, stable leads.  Generally, she has a history of breast cancer s/p lumpectomy, following with oncology.  She was last seen in the office on 04/25/2021 and was stable from a standpoint.  She was maintaining NSR.  She presents today for follow-up.  Since her last visit she has done well from a cardiac standpoint.  She notes intermittent dependent nonpitting bilateral lower extremity edema, she denies any dyspnea, weight gain, pnd, orthopnea, or palpitations. She denies symptoms concerning for angina.  Overall she reports feeling well and other than her intermittent lower extremity edema, she denies any additional concerns today.  Home Medications    Current Outpatient Medications  Medication Sig Dispense Refill   amiodarone (PACERONE) 200 MG tablet TAKE 1/2 TABLET(100 MG) BY MOUTH DAILY 45 tablet 3   amLODipine (NORVASC) 5 MG tablet TAKE 1 TABLET(5 MG) BY MOUTH DAILY 90 tablet 3   apixaban (ELIQUIS) 2.5 MG TABS tablet Take 1 tablet (2.5 mg total) by mouth 2 (  two) times daily. 180 tablet 3   calcium carbonate (OS-CAL) 600 MG TABS Take 600 mg by mouth 2 (two) times daily with a meal.     ezetimibe (ZETIA) 10 MG tablet Take 1 tablet (10 mg total) by mouth daily. TAKE 1 TABLET(10 MG) BY MOUTH DAILY Strength: 10 mg 90 tablet 3   feeding supplement, ENSURE ENLIVE, (ENSURE ENLIVE) LIQD Take 237 mLs by mouth 2 (two) times daily between meals. (Patient taking differently: Take 237 mLs by mouth daily.) 237 mL 12   fluticasone (FLONASE) 50 MCG/ACT nasal spray Place 2 sprays into both nostrils daily as needed for allergies (congestion).     furosemide (LASIX) 20 MG tablet TAKE 1 TABLET BY MOUTH EVERY DAY. EXCEPT ON MONDAY AND FRIDAY 90 tablet 3   losartan (COZAAR)  25 MG tablet Take 1 tablet (25 mg total) by mouth daily. 90 tablet 3   metoprolol tartrate (LOPRESSOR) 25 MG tablet TAKE 1.5 TABLETS BY MOUTH TWICE DAILY 180 tablet 2   Multiple Vitamins-Minerals (PRESERVISION AREDS 2) CAPS Take 1 capsule by mouth 2 (two) times daily.     nitroGLYCERIN (NITROSTAT) 0.4 MG SL tablet Place 1 tablet (0.4 mg total) under the tongue every 5 (five) minutes as needed for chest pain. 25 tablet 12   pantoprazole (PROTONIX) 40 MG tablet Take 1 tablet (40 mg total) by mouth 2 (two) times daily before a meal. 180 tablet 3   polyvinyl alcohol (LIQUIFILM TEARS) 1.4 % ophthalmic solution Place 1-2 drops into both eyes 3 (three) times daily as needed for dry eyes.     SYNTHROID 125 MCG tablet Take 125 mcg by mouth daily before breakfast.      No current facility-administered medications for this visit.     Review of Systems    She denies chest pain, palpitations, dyspnea, pnd, orthopnea, n, v, dizziness, syncope, edema, weight gain, or early satiety. All other systems reviewed and are otherwise negative except as noted above.   Physical Exam    VS:  BP 108/60   Pulse 84   Ht '5\' 6"'$  (1.676 m)   Wt 120 lb 9.6 oz (54.7 kg)   SpO2 98%   BMI 19.47 kg/m   GEN: Well nourished, well developed, in no acute distress. HEENT: normal. Neck: Supple, no JVD, carotid bruits, or masses. Cardiac: RRR, no murmurs, rubs, or gallops. No clubbing, cyanosis, edema.  Radials/DP/PT 2+ and equal bilaterally.  Respiratory:  Respirations regular and unlabored, clear to auscultation bilaterally. GI: Soft, nontender, nondistended, BS + x 4. MS: no deformity or atrophy. Skin: warm and dry, no rash. Neuro:  Strength and sensation are intact. Psych: Normal affect.  Accessory Clinical Findings    ECG personally reviewed by me today -atrial paced, 84 bpm, prolonged AV conduction- no acute changes.   Lab Results  Component Value Date   WBC 6.1 12/16/2020   HGB 13.4 12/16/2020   HCT 41.3  12/16/2020   MCV 105.4 (H) 12/16/2020   PLT 166 12/16/2020   Lab Results  Component Value Date   CREATININE 1.63 (H) 09/19/2021   BUN 34 09/19/2021   NA 137 09/19/2021   K 4.6 09/19/2021   CL 97 09/19/2021   CO2 27 09/19/2021   Lab Results  Component Value Date   ALT 15 09/19/2021   AST 27 09/19/2021   ALKPHOS 88 09/19/2021   BILITOT 0.7 09/19/2021   Lab Results  Component Value Date   CHOL 219 (H) 02/05/2018   HDL 103 02/05/2018  LDLCALC 102 (H) 02/05/2018   TRIG 70 02/05/2018   CHOLHDL 2.1 02/05/2018    Lab Results  Component Value Date   HGBA1C 5.5 01/28/2017    Assessment & Plan    1. CAD: S/p DES-LAD in 2018. Stable with no anginal symptoms. No indication for ischemic evaluation.  Continue amlodipine, metoprolol, losartan, and Zetia.  2. Paroxysmal atrial fibrillation: Recent BMET, TSH stable.  She is due for her physical with Dr. Virgina Jock in November 2023.  She will have a CBC and chest x-ray completed at this time for ongoing annual monitoring with amiodarone therapy. Continue Eliquis, amiodarone, metoprolol.   3. Tachy-brady syndrome: S/p PPM,  most recent remote device check showed normal device function, stable battery status, stable leads.   4. Aortic stenosis: S/p TAVR in 01/2017.  Most recent echo in 07/2020 showed EF 60 to 65%, normal LV function, no RWMA, indeterminate diastolic parameters, normal RV systolic function, mild mitral valve regurgitation, stable functioning AV prosthesis, no change from prior echo.    5. Chronic diastolic heart failure: Most recent echo as above. Euvolemic and well compensated on exam. Reviewed self-monitoring with daily weights. Continue Lasix every other day, losartan, and metoprolol.   6. Hypertension: BP well controlled. Continue current antihypertensive regimen.   7. Hyperlipidemia: LDL was 99 in 12/2020.  Monitored and managed per PCP.  Continue Zetia.  8. Breast cancer: S/p L lumpectomy. Follows with oncology.   9.  Disposition: Follow-up in 6 months.       Lenna Sciara, NP 10/31/2021, 3:06 PM

## 2021-11-01 NOTE — Addendum Note (Signed)
Addended by: Hinton Dyer on: 11/01/2021 02:16 PM   Modules accepted: Orders

## 2021-11-07 NOTE — Progress Notes (Signed)
Remote pacemaker transmission.   

## 2021-11-15 DIAGNOSIS — L570 Actinic keratosis: Secondary | ICD-10-CM | POA: Diagnosis not present

## 2021-11-15 DIAGNOSIS — C44529 Squamous cell carcinoma of skin of other part of trunk: Secondary | ICD-10-CM | POA: Diagnosis not present

## 2021-11-15 DIAGNOSIS — Z85828 Personal history of other malignant neoplasm of skin: Secondary | ICD-10-CM | POA: Diagnosis not present

## 2021-12-12 DIAGNOSIS — R300 Dysuria: Secondary | ICD-10-CM | POA: Diagnosis not present

## 2021-12-16 DIAGNOSIS — W5501XA Bitten by cat, initial encounter: Secondary | ICD-10-CM | POA: Diagnosis not present

## 2021-12-16 DIAGNOSIS — L03113 Cellulitis of right upper limb: Secondary | ICD-10-CM | POA: Diagnosis not present

## 2021-12-17 ENCOUNTER — Telehealth: Payer: Self-pay | Admitting: Physician Assistant

## 2021-12-17 NOTE — Telephone Encounter (Signed)
Pt prescribed moxifloxacin for cat bit/scratch. PPM in place, on amiodarone. She calls stating she hallucinated fake flowers and different colors on her bed spread and has been groggy all day. She called the overnight fellow last night and was assured moxi was safe. My only concern is a fall if she takes it again and hallucinates again. She assures me she is not concerned about fall. I advised she could take it again and if she has additional problems she should get in touch with her PCP for a different ABX. She expressed understanding of the plan.    Ledora Bottcher, PA-C 12/17/2021, 2:46 PM [place], [place] [address]

## 2021-12-26 DIAGNOSIS — H353132 Nonexudative age-related macular degeneration, bilateral, intermediate dry stage: Secondary | ICD-10-CM | POA: Diagnosis not present

## 2022-01-02 ENCOUNTER — Encounter: Payer: Self-pay | Admitting: Hematology and Oncology

## 2022-01-31 DIAGNOSIS — R7989 Other specified abnormal findings of blood chemistry: Secondary | ICD-10-CM | POA: Diagnosis not present

## 2022-01-31 DIAGNOSIS — E039 Hypothyroidism, unspecified: Secondary | ICD-10-CM | POA: Diagnosis not present

## 2022-01-31 DIAGNOSIS — I2510 Atherosclerotic heart disease of native coronary artery without angina pectoris: Secondary | ICD-10-CM | POA: Diagnosis not present

## 2022-01-31 DIAGNOSIS — E785 Hyperlipidemia, unspecified: Secondary | ICD-10-CM | POA: Diagnosis not present

## 2022-02-05 DIAGNOSIS — Z0000 Encounter for general adult medical examination without abnormal findings: Secondary | ICD-10-CM | POA: Diagnosis not present

## 2022-02-05 DIAGNOSIS — D692 Other nonthrombocytopenic purpura: Secondary | ICD-10-CM | POA: Diagnosis not present

## 2022-02-05 DIAGNOSIS — I700 Atherosclerosis of aorta: Secondary | ICD-10-CM | POA: Diagnosis not present

## 2022-02-05 DIAGNOSIS — I2721 Secondary pulmonary arterial hypertension: Secondary | ICD-10-CM | POA: Diagnosis not present

## 2022-02-05 DIAGNOSIS — C50412 Malignant neoplasm of upper-outer quadrant of left female breast: Secondary | ICD-10-CM | POA: Diagnosis not present

## 2022-02-05 DIAGNOSIS — N1831 Chronic kidney disease, stage 3a: Secondary | ICD-10-CM | POA: Diagnosis not present

## 2022-02-05 DIAGNOSIS — I5030 Unspecified diastolic (congestive) heart failure: Secondary | ICD-10-CM | POA: Diagnosis not present

## 2022-02-05 DIAGNOSIS — I130 Hypertensive heart and chronic kidney disease with heart failure and stage 1 through stage 4 chronic kidney disease, or unspecified chronic kidney disease: Secondary | ICD-10-CM | POA: Diagnosis not present

## 2022-02-05 DIAGNOSIS — R82998 Other abnormal findings in urine: Secondary | ICD-10-CM | POA: Diagnosis not present

## 2022-02-05 DIAGNOSIS — I480 Paroxysmal atrial fibrillation: Secondary | ICD-10-CM | POA: Diagnosis not present

## 2022-02-13 ENCOUNTER — Telehealth: Payer: Self-pay | Admitting: Cardiology

## 2022-02-13 MED ORDER — AMLODIPINE BESYLATE 5 MG PO TABS: ORAL_TABLET | ORAL | 3 refills | Status: DC

## 2022-02-13 NOTE — Telephone Encounter (Signed)
*  STAT* If patient is at the pharmacy, call can be transferred to refill team.   1. Which medications need to be refilled? (please list name of each medication and dose if known)  amLODipine (NORVASC) 5 MG tablet  2. Which pharmacy/location (including street and city if local pharmacy) is medication to be sent to? [id number] - [place], [place] - [address] AT [address]  3. Do they need a 30 day or 90 day supply?  90 day supply

## 2022-03-06 ENCOUNTER — Telehealth: Payer: Self-pay | Admitting: Cardiology

## 2022-03-06 MED ORDER — EZETIMIBE 10 MG PO TABS: 10.0000 mg | ORAL_TABLET | Freq: Every day | ORAL | 3 refills | Status: DC

## 2022-03-06 NOTE — Telephone Encounter (Signed)
*  STAT* If patient is at the pharmacy, call can be transferred to refill team.   1. Which medications need to be refilled? (please list name of each medication and dose if known)  ezetimibe (ZETIA) 10 MG tablet  2. Which pharmacy/location (including street and city if local pharmacy) is medication to be sent to? [id number] - [place], [place] - [address] AT [address]  3. Do they need a 30 day or 90 day supply?  90 day supply

## 2022-03-13 ENCOUNTER — Telehealth: Payer: Self-pay | Admitting: Cardiology

## 2022-03-13 MED ORDER — FUROSEMIDE 20 MG PO TABS: ORAL_TABLET | ORAL | 3 refills | Status: DC

## 2022-03-13 MED ORDER — NITROGLYCERIN 0.4 MG SL SUBL: SUBLINGUAL_TABLET | SUBLINGUAL | 12 refills | Status: AC

## 2022-03-13 NOTE — Telephone Encounter (Signed)
Patient c/o Palpitations:  High priority if patient c/o lightheadedness, shortness of breath, or chest pain  How long have you had palpitations/irregular HR/ Afib? Are you having the symptoms now? Last night about 9:30 pm but got back into rhythm around 12:00 midnight  Are you currently experiencing lightheadedness, SOB or CP?   No  Do you have a history of afib (atrial fibrillation) or irregular heart rhythm?   Yes  Have you checked your BP or HR? (document readings if available): No  Are you experiencing any other symptoms?   Nervous   Patient stated she had a Afib episode and is concerned that she has not seen a cardiologist since Dr. [name] left and would like advice about her next steps.

## 2022-03-13 NOTE — Telephone Encounter (Signed)
Patient stated she had an episode of afib last evening that lasted from 9:30 pm to midnight. She denied SOB, dizziness, or lightheadedness. Her BP this AM 165/73, P 65, O2 sat 96%. She wanted to know if she should rest or do her normal routine. Recommended she continue with her normal routine, and if she has afib again to call the afib clinic where she has been before and to call our clinic.Marland Kitchen Recommended that if afib is associated with SOB or dizziness, to call 911. She is taking all meds as prescribed. Updated lasix order to every other day and refilled NTG per her request (has not had CP). Patient wants to know if she needs a new order for afib clinic.

## 2022-03-13 NOTE — Telephone Encounter (Signed)
Patient informed of Dr. Doug Sou recommendation that afib clinic is not needed unless patient has more frequent recurrences of afib. Patient thanked me for the call.

## 2022-03-19 ENCOUNTER — Other Ambulatory Visit: Payer: Self-pay | Admitting: Cardiology

## 2022-03-20 DIAGNOSIS — L570 Actinic keratosis: Secondary | ICD-10-CM | POA: Diagnosis not present

## 2022-03-20 DIAGNOSIS — L720 Epidermal cyst: Secondary | ICD-10-CM | POA: Diagnosis not present

## 2022-03-20 DIAGNOSIS — Z85828 Personal history of other malignant neoplasm of skin: Secondary | ICD-10-CM | POA: Diagnosis not present

## 2022-03-22 ENCOUNTER — Telehealth: Payer: Self-pay | Admitting: Cardiology

## 2022-03-22 MED ORDER — METOPROLOL TARTRATE 25 MG PO TABS: 37.5000 mg | ORAL_TABLET | Freq: Two times a day (BID) | ORAL | 3 refills | Status: DC

## 2022-03-22 NOTE — Telephone Encounter (Signed)
*  STAT* If patient is at the pharmacy, call can be transferred to refill team.   1. Which medications need to be refilled? (please list name of each medication and dose if known) metoprolol tartrate (LOPRESSOR) 25 MG tablet   2. Which pharmacy/location (including street and city if local pharmacy) is medication to be sent to? [id number] - [place], [place] - [address]   3. Do they need a 30 day or 90 day supply? 90 day

## 2022-03-22 NOTE — Telephone Encounter (Signed)
Called patient and notified that RX Metoprolol Tartrate 25 mg- 1.5 tablets twice daily was sent into [facility] in [place]. Patient has an appt 04/16/22 with Dr Martinique and is aware.

## 2022-03-26 ENCOUNTER — Telehealth: Payer: Self-pay | Admitting: Cardiology

## 2022-03-26 NOTE — Telephone Encounter (Addendum)
Spoke with patient who stated that 2 1/2 weeks ago, she had episode of afib that lasted 2 hours and this morning for 30 minutes. BP was 124/71, P 63. She is not in afib at present. She stated she sent a pacer [place] in November but it was not received. Patient taking meds as prescribed. Dr. Martinique advised and ordered pacer download. Patient advised.

## 2022-03-26 NOTE — Telephone Encounter (Signed)
Pt returning nurse's call. Please advise

## 2022-03-26 NOTE — Telephone Encounter (Signed)
Called patient back not able to leave message.

## 2022-03-26 NOTE — Telephone Encounter (Signed)
  Patient c/o Palpitations:  High priority if patient c/o lightheadedness, shortness of breath, or chest pain  How long have you had palpitations/irregular HR/ Afib? Are you having the symptoms now?  Patient has pacemaker. Had episode this morning that lasted about 30 mins  Are you currently experiencing lightheadedness, SOB or CP? no  Do you have a history of afib (atrial fibrillation) or irregular heart rhythm? yes  Have you checked your BP or HR? (document readings if available): BP 124/71 HR 63 taken just now while on the phone. States BP has went up since this morning, diastolic number was in the 50's this morning  Are you experiencing any other symptoms? Patient states she has had a few episodes over the last couple of weeks and would like to know if there is anything that she needs to be doing

## 2022-03-27 ENCOUNTER — Ambulatory Visit: Payer: Medicare PPO | Attending: Cardiology

## 2022-03-27 ENCOUNTER — Telehealth: Payer: Self-pay | Admitting: Cardiology

## 2022-03-27 ENCOUNTER — Telehealth: Payer: Self-pay

## 2022-03-27 DIAGNOSIS — I495 Sick sinus syndrome: Secondary | ICD-10-CM

## 2022-03-27 LAB — CUP PACEART REMOTE DEVICE CHECK
Battery Impedance: 456 Ohm
Battery Remaining Longevity: 87 mo
Battery Voltage: 2.77 V
Brady Statistic AP VP Percent: 0 %
Brady Statistic AP VS Percent: 99 %
Brady Statistic AS VP Percent: 0 %
Brady Statistic AS VS Percent: 0 %
Date Time Interrogation Session: 20240123095535
Implantable Lead Connection Status: 753985
Implantable Lead Connection Status: 753985
Implantable Lead Implant Date: 20180807
Implantable Lead Implant Date: 20180807
Implantable Lead Location: 753859
Implantable Lead Location: 753860
Implantable Lead Model: 5076
Implantable Lead Model: 5076
Implantable Pulse Generator Implant Date: 20180807
Lead Channel Impedance Value: 364 Ohm
Lead Channel Impedance Value: 504 Ohm
Lead Channel Pacing Threshold Amplitude: 0.625 V
Lead Channel Pacing Threshold Amplitude: 0.75 V
Lead Channel Pacing Threshold Pulse Width: 0.4 ms
Lead Channel Pacing Threshold Pulse Width: 0.4 ms
Lead Channel Setting Pacing Amplitude: 2 V
Lead Channel Setting Pacing Amplitude: 2.5 V
Lead Channel Setting Pacing Pulse Width: 0.4 ms
Lead Channel Setting Sensing Sensitivity: 5.6 mV
Zone Setting Status: 755011
Zone Setting Status: 755011

## 2022-03-27 NOTE — Telephone Encounter (Signed)
I spoke with the patient and helped her send a manual transmission for the nurse can review.

## 2022-03-27 NOTE — Telephone Encounter (Signed)
Patient aware and verbalized understanding.

## 2022-03-27 NOTE — Telephone Encounter (Signed)
Request received from Dr. Martinique for remote transmission.  Per review of last transmission last recorded afib episode was March 13, 2022.    Pt is atrially paced 99.5%.    Event counters indicate PVC's and PAC's.

## 2022-03-27 NOTE — Telephone Encounter (Signed)
Spoke with pt, questions regarding a fib and activity answered.

## 2022-03-27 NOTE — Telephone Encounter (Signed)
New Message:     Please call, she have some questions about her Afib.

## 2022-03-27 NOTE — Telephone Encounter (Signed)
Duplicate

## 2022-04-12 ENCOUNTER — Encounter (HOSPITAL_COMMUNITY): Payer: Self-pay | Admitting: *Deleted

## 2022-04-12 NOTE — Progress Notes (Unsigned)
[name] [name] Date of Birth: 02-04-1930 Medical Record [id number]  History of Present Illness: [name] [name] is seen today for follow up CAD and AV stenosis.  She has a history of paroxysmal atrial fibrillation with tachy-brady syndrome and is s/p pacemaker implant. She is on chronic anticoagulation with Eliquis. Echo in July 2015 showed moderate aortic stenosis that had progressed since 2012. Repeat in July 2016 and July 2017 showed no change. In April 2017 she had atrial fibrillation that converted spontaneously. This was felt to be triggered by a steroid injection. Her metoprolol was increased at that time. When seen in June 2017 she was noted to be in persistent Afib. We placed her on Flecainide but this was later stopped due to side effects. She was seen in November with recurrent and persistent Afib. Rate was controlled but she was symptomatic. Seen in Afib clinic and started on amiodarone with subsequent conversion to NSR.   She later developed symptoms of dyspnea.   She had PFTs in December that showed mild obstructive defect with marked decrease in diffusion capacity.  Myoview study was normal. When seen by [name] [name] in late July it was noted that she had failure of her RA lead. Her device was reprogrammed but she developed pectoral stimulation. She underwent explantation of her old generator and placement of a new left chest dual chamber pacemaker on October 09 2016. Because of her ongoing symptoms we recommended a right and left heart cath and this was done on 11/27/16. This demonstrated severe AS as well as a severe stenosis in the mid LAD. She subsequently underwent successful PCI of the LAD with DES on 12/20/16 with 2.75 x 16 mm Promus stent.   She underwent TAVR on November 27,2018  with a #23 Edwards Sapien valve. Post op course complicated by pericarditis and AFib with RVR she was treated with anti-inflammatories and Amiodarone. Later Eliquis resumed and ASA stopped. Still on Plavix for  stent. Follow up Echo was [place]. Repeat Echo 01/22/18 still showed good results. Pacemaker check on 12/12/17 showed normal function without atrial high rate episodes. When seen in the valve clinic on 01/22/18 Plavix was discontinued.   She underwent  left breast lumpectomy with Dr [name] [name] for newly diagnosed breast mass on 10/04/20. Initially placed on Letrozole but this was discontinued due to side effects.   She has rare palpitations. No chest pain or dyspnea. No edema. Feels well. No dizziness or syncope. No bleeding.   Current Outpatient Medications on File Prior to Visit  Medication Sig Dispense Refill   amiodarone (PACERONE) 200 MG tablet TAKE 1/2 TABLET(100 MG) BY MOUTH DAILY 45 tablet 3   amLODipine (NORVASC) 5 MG tablet TAKE 1 TABLET(5 MG) BY MOUTH DAILY 90 tablet 3   apixaban (ELIQUIS) 2.5 MG TABS tablet Take 1 tablet (2.5 mg total) by mouth 2 (two) times daily. 180 tablet 3   calcium carbonate (OS-CAL) 600 MG TABS Take 600 mg by mouth 2 (two) times daily with a meal.     ezetimibe (ZETIA) 10 MG tablet Take 1 tablet (10 mg total) by mouth daily. TAKE 1 TABLET(10 MG) BY MOUTH DAILY Strength: 10 mg 90 tablet 3   feeding supplement, ENSURE ENLIVE, (ENSURE ENLIVE) LIQD Take 237 mLs by mouth 2 (two) times daily between meals. (Patient taking differently: Take 237 mLs by mouth daily.) 237 mL 12   fluticasone (FLONASE) 50 MCG/ACT nasal spray Place 2 sprays into both nostrils daily as needed for allergies (congestion).  furosemide (LASIX) 20 MG tablet Take one tablet (55m) every other day. 90 tablet 3   losartan (COZAAR) 25 MG tablet Take 1 tablet (25 mg total) by mouth daily. 90 tablet 3   metoprolol tartrate (LOPRESSOR) 25 MG tablet Take 1.5 tablets (37.5 mg total) by mouth 2 (two) times daily. 270 tablet 3   Multiple Vitamins-Minerals (PRESERVISION AREDS 2) CAPS Take 1 capsule by mouth 2 (two) times daily.     nitroGLYCERIN (NITROSTAT) 0.4 MG SL tablet For chest pain, tightness, or pressure.  While sitting, place 1 tablet under tongue. May be used every 5 minutes as needed, for up to 15 minutes. Do not use more than 3 tablets. 25 tablet 12   pantoprazole (PROTONIX) 40 MG tablet Take 1 tablet (40 mg total) by mouth 2 (two) times daily before a meal. 180 tablet 3   polyvinyl alcohol (LIQUIFILM TEARS) 1.4 % ophthalmic solution Place 1-2 drops into both eyes 3 (three) times daily as needed for dry eyes.     SYNTHROID 125 MCG tablet Take 125 mcg by mouth daily before breakfast.      No current facility-administered medications on file prior to visit.    Allergies  Allergen Reactions   Penicillins Anaphylaxis and Other (See Comments)    Has patient had a PCN reaction causing immediate rash, facial/tongue/throat swelling, SOB or lightheadedness with hypotension: Yes Has patient had a PCN reaction causing severe rash involving mucus membranes or skin necrosis: No Has patient had a PCN reaction that required hospitalization: No Has patient had a PCN reaction occurring within the last 10 years: No If all of the above answers are "NO", then may proceed with Cephalosporin use.    Tetanus Toxoid Anaphylaxis   Demerol Nausea Only    Severe nausea   Clindamycin/Lincomycin     Sores in mouth   Letrozole Other (See Comments)   Meningococcal Poly Tetanus Conj Vaccine Acwy Other (See Comments)   Codeine Nausea Only    Severe nausea    Past Medical History:  Diagnosis Date   CKD (chronic kidney disease)    Complication of anesthesia    Coronary artery disease    a. 12/20/2016: s/p DES to LAD    Diverticulosis    Family history of adverse reaction to anesthesia    sister also has post-op n/v   Family history of breast cancer 09/01/2020   GERD (gastroesophageal reflux disease)    Hemorrhoids    Hyperlipidemia    Hypertension    Hypothyroidism    Osteoarthritis    Paroxysmal atrial fibrillation (HCC)    a. on OLickingwith Xarelto but switched to Eliquis after stenting.    Personal  history of skin cancer 09/01/2020   PONV (postoperative nausea and vomiting)    Presence of permanent cardiac pacemaker    S/P TAVR (transcatheter aortic valve replacement) 01/29/2017   23 mm Edwards Sapien 3 transcatheter heart valve placed via percutaneous right transfemoral approach     Past Surgical History:  Procedure Laterality Date   ABDOMINAL HYSTERECTOMY  1980   BASAL CELL CARCINOMA EXCISION     BREAST LUMPECTOMY WITH RADIOACTIVE SEED LOCALIZATION Left 10/04/2020   Procedure: LEFT BREAST LUMPECTOMY WITH RADIOACTIVE SEED LOCALIZATION;  Surgeon: TJovita Kussmaul MD;  Location: [facility]  Service: General;  Laterality: Left;   CARDIAC CATHETERIZATION  ~ 11/2016   CATARACT EXTRACTION W/ INTRAOCULAR LENS  IMPLANT, BILATERAL Bilateral    CORONARY ANGIOPLASTY WITH STENT PLACEMENT  12/20/2016   "1 stent"  CORONARY STENT INTERVENTION N/A 12/20/2016   Procedure: CORONARY STENT INTERVENTION;  Surgeon: Martinique, Aneli Zara M, MD;  Location: [facility];  Service: Cardiovascular;  Laterality: N/A;   FINGER SURGERY Left    "fell; developed skiers thumb; had to operate on it"   INSERT / REPLACE / [place]   for syptomatic bradycardia and syncope -- in [place] (IM) NAIL INTERTROCHANTERIC Left 10/19/2018   Procedure: INTRAMEDULLARY (IM) NAIL INTERTROCHANTRIC;  Surgeon: Altamese [place], MD;  Location: [facility];  Service: Orthopedics;  Laterality: Left;   LEAD REVISION/REPAIR N/A 10/09/2016   New left subclavian MDT Adapta L PPM dual chamber system implanted by Dr Rayann [name] with previously placed R subclavian system abandoned   PACEMAKER GENERATOR CHANGE  2001   pulse generator replacement by Dr. Pincus Large GENERATOR CHANGE  04/01/2008   PPM Medtronic -- model # [id number] serial # [id number] -- pulse generator replacement by Dr. Verlon Setting    RIGHT/LEFT [place] N/A 11/27/2016   Procedure: RIGHT/LEFT HEART CATH AND CORONARY ANGIOGRAPHY;   Surgeon: Martinique, Jatavian Calica M, MD;  Location: [facility];  Service: Cardiovascular;  Laterality: N/A;   SQUAMOUS CELL CARCINOMA EXCISION     TEE WITHOUT CARDIOVERSION N/A 01/29/2017   Procedure: TRANSESOPHAGEAL ECHOCARDIOGRAM (TEE);  Surgeon: Sherren Mocha, MD;  Location: [facility];  Service: Open Heart Surgery;  Laterality: N/A;   TONSILLECTOMY     TRANSCATHETER AORTIC VALVE REPLACEMENT, TRANSFEMORAL N/A 01/29/2017   Procedure: TRANSCATHETER AORTIC VALVE REPLACEMENT, TRANSFEMORAL;  Surgeon: Sherren Mocha, MD;  Location: [facility];  Service: Open Heart Surgery;  Laterality: N/A;    Social History   Tobacco Use  Smoking Status Former   Packs/day: 1.50   Years: 30.00   Total pack years: 45.00   Types: Cigarettes   Quit date: 03/05/1978   Years since quitting: 44.1  Smokeless Tobacco Never    Social History   Substance and Sexual Activity  Alcohol Use No    Family History  Problem Relation Age of Onset   Alzheimer's disease Mother    Kidney cancer Father        dx 67s   Breast cancer Maternal Aunt 75   Breast cancer Cousin        maternal cousin, dx >50    Review of Systems: As noted in history of present illness.  All other systems were reviewed and are negative.  Physical Exam: There were no vitals taken for this visit. GENERAL:  Well appearing, elderly WF in NAD. Walks with cane HEENT:  PERRL, EOMI, sclera are clear. Oropharynx is clear. NECK:  No jugular venous distention, carotid upstroke brisk and symmetric, no bruits, no thyromegaly or adenopathy LUNGS:  Clear to auscultation bilaterally CHEST:  Unremarkable HEART:  RRR,  PMI not displaced or sustained,S1 and S2 within normal limits, no S3, no S4: no clicks, no rubs, very soft systolic murmur at the apex. ABD:  Soft, nontender. BS +, no masses or bruits. No hepatomegaly, no splenomegaly EXT:  2 + pulses throughout, tr edema, no cyanosis no clubbing SKIN:  Warm and dry.  No rashes NEURO:  Alert and oriented x 3.  Cranial nerves II through XII intact. PSYCH:  Cognitively intact   LABORATORY DATA:  Lab Results  Component Value Date   WBC 6.1 12/16/2020   HGB 13.4 12/16/2020   HCT 41.3 12/16/2020   PLT 166 12/16/2020   GLUCOSE 74 09/19/2021   CHOL 219 (H)  02/05/2018   TRIG 70 02/05/2018   HDL 103 02/05/2018   LDLCALC 102 (H) 02/05/2018   ALT 15 09/19/2021   AST 27 09/19/2021   NA 137 09/19/2021   K 4.6 09/19/2021   CL 97 09/19/2021   CREATININE 1.63 (H) 09/19/2021   BUN 34 09/19/2021   CO2 27 09/19/2021   TSH 3.890 09/19/2021   INR 1.23 01/29/2017   HGBA1C 5.5 01/28/2017   Labs dated 07/15/15: cholesterol 229, triglycerides 111, HDL 76, LDL 131.  Dated 07/19/16: cholesterol 247, triglycerides 71, HDL 88, LDL 145. BUN 28, creatinine 1.3. Other chemistries, CBC and TSH normal. Dated 03/15/17: creatinine 1.7, Hgb 11.3. Dated 10/22/17: cholesterol 251, triglycerides 66, HDL 93, LDL 145. Hgb 11.7, creatinine 1.3. TSH and ALT normal. Dated 01/12/19: cholesterol 224, triglycerides 105, HDL 96, LDL 107.  Dated 06/15/19: BUN 36, creatinine 1.5. CMET otherwise normal. CBC and TSH normal. Dated 12/26/20: cholesterol 199, triglycerides 51, HDL 90, LDL 99.  Dated 01/31/22: cholesterol 206, triglycerides 53, HDL 96, LDL 99. LFTs normal.   Echo 07/26/20: 1. Left ventricular ejection fraction, by estimation, is 60 to 65%. The left ventricle has normal function. The left ventricle has no regional wall motion abnormalities. Left ventricular diastolic parameters are indeterminate. 2. Right ventricular systolic function is normal. The right ventricular size is normal. 3. Mild mitral valve regurgitation. 4. Tricuspid valve regurgitation is moderate. 5. S/p TAVR (01/29/17) 23 mm Edwards bioprosthesis. Peak and mean gradients through the valve are 17 and 8 mm Hg repcectively AVA (VTI 1.53 cm2) No significant change from gradients in echo of 01/22/18 . Aortic valve regurgitation is mild. Left Ventricle: Left  ventricular ejection fraction, by estimation, is 60 to 65%. The left ventricle has normal function. The left ventricle has no regional wall motion abnormalities. The left ventricular internal cavity size was normal in size. There is no left ventricular hypertrophy. Left ventricular diastolic parameters are indeterminate. Right Ventricle: The right ventricular size is normal. Right vetricular wall thickness was not assessed. Right ventricular systolic function is normal. Left Atrium: Left atrial size was normal in size. Right Atrium: Right atrial size was normal in size. Pericardium: There is no evidence of pericardial effusion. Mitral Valve: There is mild thickening of the mitral valve leaflet(s). Mild mitral annular calcification. Mild  Assessment / Plan: 1. Atrial fibrillation with sick sinus syndrome. Prior history of  persistent AFib.  Now on amiodarone 100 mg daily. Maintaining NSR.  Continue long-term anticoagulation. On  Eliquis 2.5 mg bid.  Dose adjusted for age and renal function. Labs are up to date.   2. Aortic stenosis-   symptomatic. Now s/p TAVR. Stable exam. Echo May 2022 looked good.   3. CAD with significant single vessel obstructive disease in the mid LAD. S/p PCI of the LAD with DES October 2018.  On Eliquis. No ASA due to bleeding risk.  4. HTN- well controlled.  Will continue metoprolol Bid, take losartan in the am. Amlodipine in the pm.   5. Hyperlipidemia. Intolerant of statins. Now on Zetia 10 mg daily.   6. Chronic diastolic CHF. She is well compensated. Only mild swelling in am.   7. S/p femoral neck fracture with ORIF. Recovered.   8. Breast CA. S/p lumpectomy. Monitored by oncology. Did not tolerate antiestrogen therapy.   Follow up in 6 months.

## 2022-04-13 ENCOUNTER — Encounter: Payer: Self-pay | Admitting: Cardiology

## 2022-04-13 ENCOUNTER — Ambulatory Visit: Payer: Medicare PPO | Attending: Cardiology | Admitting: Cardiology

## 2022-04-13 VITALS — BP 120/82 | HR 68 | Ht 66.0 in | Wt 122.0 lb

## 2022-04-13 DIAGNOSIS — I495 Sick sinus syndrome: Secondary | ICD-10-CM

## 2022-04-13 DIAGNOSIS — D6869 Other thrombophilia: Secondary | ICD-10-CM

## 2022-04-13 DIAGNOSIS — I4819 Other persistent atrial fibrillation: Secondary | ICD-10-CM | POA: Diagnosis not present

## 2022-04-13 DIAGNOSIS — Z79899 Other long term (current) drug therapy: Secondary | ICD-10-CM | POA: Diagnosis not present

## 2022-04-13 LAB — CUP PACEART INCLINIC DEVICE CHECK
Battery Impedance: 481 Ohm
Battery Remaining Longevity: 83 mo
Battery Voltage: 2.77 V
Brady Statistic AP VP Percent: 0 %
Brady Statistic AP VS Percent: 99 %
Brady Statistic AS VP Percent: 0 %
Brady Statistic AS VS Percent: 0 %
Date Time Interrogation Session: 20240209170207
Implantable Lead Connection Status: 753985
Implantable Lead Connection Status: 753985
Implantable Lead Implant Date: 20180807
Implantable Lead Implant Date: 20180807
Implantable Lead Location: 753859
Implantable Lead Location: 753860
Implantable Lead Model: 5076
Implantable Lead Model: 5076
Implantable Pulse Generator Implant Date: 20180807
Lead Channel Impedance Value: 319 Ohm
Lead Channel Impedance Value: 456 Ohm
Lead Channel Pacing Threshold Amplitude: 0.625 V
Lead Channel Pacing Threshold Amplitude: 0.75 V
Lead Channel Pacing Threshold Pulse Width: 0.4 ms
Lead Channel Pacing Threshold Pulse Width: 0.4 ms
Lead Channel Sensing Intrinsic Amplitude: 15.67 mV
Lead Channel Setting Pacing Amplitude: 2 V
Lead Channel Setting Pacing Amplitude: 2.5 V
Lead Channel Setting Pacing Pulse Width: 0.4 ms
Lead Channel Setting Sensing Sensitivity: 5.6 mV
Zone Setting Status: 755011
Zone Setting Status: 755011

## 2022-04-13 NOTE — Patient Instructions (Signed)
Medication Instructions:  Your physician recommends that you continue on your current medications as directed. Please refer to the Current Medication list given to you today.  *If you need a refill on your cardiac medications before your next appointment, please call your pharmacy*   Lab Work: Amiodarone surveillance labs    Testing/Procedures: None ordered   Follow-Up: At [facility], you and your health needs are our priority.  As part of our continuing mission to provide you with exceptional heart care, we have created designated Provider Care Teams.  These Care Teams include your primary Cardiologist (physician) and Advanced Practice Providers (APPs -  Physician Assistants and Nurse Practitioners) who all work together to provide you with the care you need, when you need it.  We recommend signing up for the patient portal called "MyChart".  Sign up information is provided on this After Visit Summary.  MyChart is used to connect with patients for Virtual Visits (Telemedicine).  Patients are able to view lab/test results, encounter notes, upcoming appointments, etc.  Non-urgent messages can be sent to your provider as well.   To learn more about what you can do with MyChart, go to [email or web address].    Remote monitoring is used to monitor your Pacemaker or ICD from home. This monitoring reduces the number of office visits required to check your device to one time per year. It allows [place] to keep an eye on the functioning of your device to ensure it is working properly. You are scheduled for a device check from home on 06/26/2022. You may send your transmission at any time that day. If you have a wireless device, the transmission will be sent automatically. After your physician reviews your transmission, you will receive a postcard with your next transmission date.  Your next appointment:   6 month(s)  The format for your next appointment:   In Person  Provider:   Legrand Como "Oda Kilts, PA-C{   Thank you for choosing [facility]!!   Trinidad Curet, RN [phone]

## 2022-04-13 NOTE — Progress Notes (Signed)
Electrophysiology Office Note   Date:  04/13/2022   ID:  [name] [name], [name] [name] [birth year], MRN [id number]  PCP:  [name], MD  Cardiologist:  Martin[place] Primary Electrophysiologist:  Constance Haw, MD    Chief Complaint: pacemaker   History of Present Illness: [name] [name] is a 89 y.o. female who is being seen today for the evaluation of pacemaker at the request of [name], MD. Presenting today for electrophysiology evaluation.  She has a history seen for coronary artery disease post LAD stent, CKD, hypertension, atrial fibrillation, aortic stenosis post TAVR.  She is post Medtronic dual-chamber pacemaker implanted for sick sinus syndrome.  Today, she denies symptoms of palpitations, chest pain, shortness of breath, orthopnea, PND, lower extremity edema, claudication, dizziness, presyncope, syncope, bleeding, or neurologic sequela. The patient is tolerating medications without difficulties.    Past Medical History:  Diagnosis Date   CKD (chronic kidney disease)    Complication of anesthesia    Coronary artery disease    a. 12/20/2016: s/p DES to LAD    Diverticulosis    Family history of adverse reaction to anesthesia    sister also has post-op n/v   Family history of breast cancer 09/01/2020   GERD (gastroesophageal reflux disease)    Hemorrhoids    Hyperlipidemia    Hypertension    Hypothyroidism    Osteoarthritis    Paroxysmal atrial fibrillation ([place])    a. on [place] with Xarelto but switched to Eliquis after stenting.    Personal history of skin cancer 09/01/2020   PONV (postoperative nausea and vomiting)    Presence of permanent cardiac pacemaker    S/P TAVR (transcatheter aortic valve replacement) 01/29/2017   23 mm Edwards Sapien 3 transcatheter heart valve placed via percutaneous right transfemoral approach    Past Surgical History:  Procedure Laterality Date   ABDOMINAL HYSTERECTOMY  1980   BASAL CELL CARCINOMA EXCISION     BREAST LUMPECTOMY WITH  RADIOACTIVE SEED LOCALIZATION Left 10/04/2020   Procedure: LEFT BREAST LUMPECTOMY WITH RADIOACTIVE SEED LOCALIZATION;  Surgeon: Jovita Kussmaul, MD;  Location: [facility];  Service: General;  Laterality: Left;   CARDIAC CATHETERIZATION  ~ 11/2016   CATARACT EXTRACTION W/ INTRAOCULAR LENS  IMPLANT, BILATERAL Bilateral    CORONARY ANGIOPLASTY WITH STENT PLACEMENT  12/20/2016   "1 stent"   CORONARY STENT INTERVENTION N/A 12/20/2016   Procedure: CORONARY STENT INTERVENTION;  Surgeon: Martinique, Peter M, MD;  Location: [facility];  Service: Cardiovascular;  Laterality: N/A;   FINGER SURGERY Left    "fell; developed skiers thumb; had to operate on it"   INSERT / REPLACE / [place]   for syptomatic bradycardia and syncope -- in [place] ([place]) NAIL INTERTROCHANTERIC Left 10/19/2018   Procedure: INTRAMEDULLARY ([place]) NAIL INTERTROCHANTRIC;  Surgeon: Altamese [place], MD;  Location: [facility];  Service: Orthopedics;  Laterality: Left;   LEAD REVISION/REPAIR N/A 10/09/2016   New left subclavian MDT Adapta L PPM dual chamber system implanted by Dr [name] with previously placed R subclavian system abandoned   PACEMAKER GENERATOR CHANGE  2001   pulse generator replacement by Dr. Pincus Large GENERATOR CHANGE  04/01/2008   PPM Medtronic -- model # [id number] serial # [id number] -- pulse generator replacement by Dr. Verlon Setting    RIGHT/LEFT [place] N/A 11/27/2016   Procedure: RIGHT/LEFT HEART CATH AND CORONARY ANGIOGRAPHY;  Surgeon: Martinique, Peter M, MD;  Location: [facility];  Service: Cardiovascular;  Laterality: N/A;   SQUAMOUS CELL CARCINOMA EXCISION     TEE WITHOUT CARDIOVERSION N/A 01/29/2017   Procedure: TRANSESOPHAGEAL ECHOCARDIOGRAM (TEE);  Surgeon: Sherren Mocha, MD;  Location: [facility];  Service: Open Heart Surgery;  Laterality: N/A;   TONSILLECTOMY     TRANSCATHETER AORTIC VALVE REPLACEMENT, TRANSFEMORAL N/A 01/29/2017   Procedure:  TRANSCATHETER AORTIC VALVE REPLACEMENT, TRANSFEMORAL;  Surgeon: Sherren Mocha, MD;  Location: [facility];  Service: Open Heart Surgery;  Laterality: N/A;     Current Outpatient Medications  Medication Sig Dispense Refill   amiodarone (PACERONE) 200 MG tablet TAKE 1/2 TABLET(100 MG) BY MOUTH DAILY 45 tablet 3   amLODipine (NORVASC) 5 MG tablet TAKE 1 TABLET(5 MG) BY MOUTH DAILY 90 tablet 3   apixaban (ELIQUIS) 2.5 MG TABS tablet Take 1 tablet (2.5 mg total) by mouth 2 (two) times daily. 180 tablet 3   calcium carbonate (OS-CAL) 600 MG TABS Take 600 mg by mouth 2 (two) times daily with a meal.     ezetimibe (ZETIA) 10 MG tablet Take 1 tablet (10 mg total) by mouth daily. TAKE 1 TABLET(10 MG) BY MOUTH DAILY Strength: 10 mg 90 tablet 3   feeding supplement, ENSURE ENLIVE, (ENSURE ENLIVE) LIQD Take 237 mLs by mouth 2 (two) times daily between meals. (Patient taking differently: Take 237 mLs by mouth daily.) 237 mL 12   fluticasone (FLONASE) 50 MCG/ACT nasal spray Place 2 sprays into both nostrils daily as needed for allergies (congestion).     furosemide (LASIX) 20 MG tablet Take one tablet (29m) every other day. 90 tablet 3   losartan (COZAAR) 25 MG tablet Take 1 tablet (25 mg total) by mouth daily. 90 tablet 3   metoprolol tartrate (LOPRESSOR) 25 MG tablet Take 1.5 tablets (37.5 mg total) by mouth 2 (two) times daily. 270 tablet 3   Multiple Vitamins-Minerals (PRESERVISION AREDS 2) CAPS Take 1 capsule by mouth 2 (two) times daily.     nitroGLYCERIN (NITROSTAT) 0.4 MG SL tablet For chest pain, tightness, or pressure. While sitting, place 1 tablet under tongue. May be used every 5 minutes as needed, for up to 15 minutes. Do not use more than 3 tablets. 25 tablet 12   pantoprazole (PROTONIX) 40 MG tablet Take 1 tablet (40 mg total) by mouth 2 (two) times daily before a meal. 180 tablet 3   polyvinyl alcohol (LIQUIFILM TEARS) 1.4 % ophthalmic solution Place 1-2 drops into both eyes 3 (three) times daily as  needed for dry eyes.     SYNTHROID 125 MCG tablet Take 125 mcg by mouth daily before breakfast.      No current facility-administered medications for this visit.    Allergies:   Penicillins, Tetanus toxoid, Demerol, Clindamycin/lincomycin, Letrozole, Meningococcal poly tetanus conj vaccine acwy, Moxifloxacin hcl, and Codeine   Social History:  The patient  reports that she quit smoking about 44 years ago. Her smoking use included cigarettes. She has a 45.00 pack-year smoking history. She has never used smokeless tobacco. She reports that she does not drink alcohol and does not use drugs.   Family History:  The patient's family history includes Alzheimer's disease in her mother; Breast cancer in her cousin; Breast cancer (age of onset: 530 in her maternal aunt; Kidney cancer in her father.    ROS:  Please see the history of present illness.   Otherwise, review of systems is positive for none.   All other systems are reviewed and negative.    PHYSICAL  EXAM: VS:  BP 120/82   Pulse 68   Ht 5' 6"$  (1.676 m)   Wt 122 lb (55.3 kg)   SpO2 99%   BMI 19.69 kg/m  , BMI Body mass index is 19.69 kg/m. GEN: Well nourished, well developed, in no acute distress  HEENT: normal  Neck: no JVD, carotid bruits, or masses Cardiac: RRR; no murmurs, rubs, or gallops,no edema  Respiratory:  clear to auscultation bilaterally, normal work of breathing GI: soft, nontender, nondistended, + BS MS: no deformity or atrophy  Skin: warm and dry, device pocket is well healed Neuro:  Strength and sensation are intact Psych: euthymic mood, full affect  EKG:  EKG is ordered today. Personal review of the ekg ordered shows atrial paced  Device interrogation is reviewed today in detail.  See PaceArt for details.   Recent Labs: 09/19/2021: ALT 15; BUN 34; Creatinine, Ser 1.63; Potassium 4.6; Sodium 137; TSH 3.890    Lipid Panel     Component Value Date/Time   CHOL 219 (H) 02/05/2018 1440   TRIG 70 02/05/2018  1440   HDL 103 02/05/2018 1440   CHOLHDL 2.1 02/05/2018 1440   LDLCALC 102 (H) 02/05/2018 1440     Wt Readings from Last 3 Encounters:  04/13/22 122 lb (55.3 kg)  10/31/21 120 lb 9.6 oz (54.7 kg)  09/19/21 124 lb 3.2 oz (56.3 kg)      Other studies Reviewed: Additional studies/ records that were reviewed today include: TTE 07/26/20  Review of the above records today demonstrates:   1. Left ventricular ejection fraction, by estimation, is 60 to 65%. The  left ventricle has normal function. The left ventricle has no regional  wall motion abnormalities. Left ventricular diastolic parameters are  indeterminate.   2. Right ventricular systolic function is normal. The right ventricular  size is normal.   3. Mild mitral valve regurgitation.   4. Tricuspid valve regurgitation is moderate.   5. S/p TAVR (01/29/17) 23 mm Edwards bioprosthesis. Peak and mean  gradients through the valve are 17 and 8 mm Hg repcectively AVA (VTI 1.53  cm2) No significant change from gradients in echo of 01/22/18 . Aortic  valve regurgitation is mild.    ASSESSMENT AND PLAN:  1.  Sick sinus syndrome: Status post Medtronic dual-chamber pacemaker.  Device functioning appropriately.  No changes.  2.  Persistent atrial fibrillation: Currently on amiodarone,, dose above.  Remains in sinus rhythm.  No changes.  3.  Hypertension: well controlled  4.  Coronary artery disease: No current ischemia  5.  Second hypercoagulable state: Currently on Eliquis for atrial fibrillation  6 high risk medication monitoring: Currently on amiodarone.  Labs without abnormality.  Current medicines are reviewed at length with the patient today.   The patient does not have concerns regarding her medicines.  The following changes were made today:  none  Labs/ tests ordered today include:  Orders Placed This Encounter  Procedures   EKG 12-Lead     Disposition:   FU 6 months  Signed, Semira Stoltzfus Meredith Leeds, MD  04/13/2022  5:03 PM     [address] [address] [phone] (office) [phone] (fax)

## 2022-04-16 ENCOUNTER — Encounter: Payer: Self-pay | Admitting: Cardiology

## 2022-04-16 ENCOUNTER — Telehealth: Payer: Self-pay

## 2022-04-16 ENCOUNTER — Ambulatory Visit: Payer: Medicare PPO | Attending: Cardiology | Admitting: Cardiology

## 2022-04-16 VITALS — BP 132/62 | HR 82 | Ht 65.0 in | Wt 122.2 lb

## 2022-04-16 DIAGNOSIS — I480 Paroxysmal atrial fibrillation: Secondary | ICD-10-CM

## 2022-04-16 DIAGNOSIS — I495 Sick sinus syndrome: Secondary | ICD-10-CM | POA: Diagnosis not present

## 2022-04-16 DIAGNOSIS — I2510 Atherosclerotic heart disease of native coronary artery without angina pectoris: Secondary | ICD-10-CM | POA: Diagnosis not present

## 2022-04-16 DIAGNOSIS — E785 Hyperlipidemia, unspecified: Secondary | ICD-10-CM | POA: Diagnosis not present

## 2022-04-16 DIAGNOSIS — Z952 Presence of prosthetic heart valve: Secondary | ICD-10-CM | POA: Diagnosis not present

## 2022-04-16 NOTE — Patient Instructions (Signed)
Medication Instructions:  No changes *If you need a refill on your cardiac medications before your next appointment, please call your pharmacy*  Follow-Up: At [facility] you and your health needs are our priority.  As part of our continuing mission to provide you with exceptional heart care, we have created designated Provider Care Teams.  These Care Teams include your primary Cardiologist (physician) and Advanced Practice Providers (APPs -  Physician Assistants and Nurse Practitioners) who all work together to provide you with the care you need, when you need it.  We recommend signing up for the patient portal called "MyChart".  Sign up information is provided on this After Visit Summary.  MyChart is used to connect with patients for Virtual Visits (Telemedicine).  Patients are able to view lab/test results, encounter notes, upcoming appointments, etc.  Non-urgent messages can be sent to your provider as well.   To learn more about what you can do with MyChart, go to [email or web address].    Your next appointment:   6 month(s)  Provider:   Peter Martinique, MD

## 2022-04-16 NOTE — Telephone Encounter (Signed)
Pt called and states she started noticing pain to her left breast about 10 days ago. She denies erythema, fever or swelling. She is understandably concerned given her hx of breast cancer and requests to be seen.  She was offered appt with Wilber Bihari, NP for further eval and accepted appt for 04/17/22 at 1145. She knows to arrive at 1130 for check in.

## 2022-04-17 ENCOUNTER — Inpatient Hospital Stay: Payer: Medicare PPO | Attending: Adult Health | Admitting: Adult Health

## 2022-04-17 ENCOUNTER — Encounter: Payer: Self-pay | Admitting: Adult Health

## 2022-04-17 ENCOUNTER — Other Ambulatory Visit: Payer: Self-pay

## 2022-04-17 VITALS — BP 141/64 | HR 84 | Temp 97.9°F | Resp 18 | Ht 65.0 in | Wt 123.7 lb

## 2022-04-17 DIAGNOSIS — C50412 Malignant neoplasm of upper-outer quadrant of left female breast: Secondary | ICD-10-CM | POA: Insufficient documentation

## 2022-04-17 DIAGNOSIS — Z85828 Personal history of other malignant neoplasm of skin: Secondary | ICD-10-CM | POA: Diagnosis not present

## 2022-04-17 DIAGNOSIS — Z170 Estrogen receptor positive status [ER+]: Secondary | ICD-10-CM | POA: Insufficient documentation

## 2022-04-17 DIAGNOSIS — N63 Unspecified lump in unspecified breast: Secondary | ICD-10-CM

## 2022-04-17 NOTE — Assessment & Plan Note (Signed)
Gladys is a 89 year old woman with history of left breast stage Ia ER/PR positive breast cancer diagnosed in June 2022 status postlumpectomy and unable to tolerate antiestrogen therapy.  She continues on observation alone.  She does have a new weeklong history of left breast pain.  Her breast exam does reveal a very small well-circumscribed nodule that is compatible with an oil cyst.  However considering her history we will obtain mammogram and ultrasound to make sure this is the case.  I reviewed the above recommendations with Stanton Kidney and let her know that I have a low suspicion for cancer but we do need to do the mammogram and ultrasound to ensure that the nodule is a cyst.  Left breast mammogram and ultrasound orders were placed and my nurse is sending these over to [facility] to get [facility] scheduled.    She has follow-up later this year with Dr. Lindi Adie which I recommended that she keep.

## 2022-04-17 NOTE — Progress Notes (Signed)
[facility] Follow up:    [name] Baton, MD Time [place] [address]   DIAGNOSIS:  Cancer Staging  Malignant neoplasm of upper-outer quadrant of left breast in female, estrogen receptor positive ([place]) Staging form: Breast, AJCC 8th Edition - Clinical stage from 08/31/2020: Stage IA (cT1b, cN0, cM0, G1, ER+, PR+, HER2-) - Signed by [name] Lose, MD on 08/31/2020 Stage prefix: Initial diagnosis Histologic grading system: 3 grade system - Pathologic stage from 10/04/2020: Stage IA (pT1b, pN0, cM0, G1, ER+, PR+, HER2-) - Signed by [name] Phlegm, NP on 01/15/2021 Stage prefix: Initial diagnosis Histologic grading system: 3 grade system   SUMMARY OF ONCOLOGIC HISTORY: Oncology History  Malignant neoplasm of upper-outer quadrant of left breast in female, estrogen receptor positive ([place])  08/26/2020 Initial Diagnosis   Screening detected left breast mass 0.8 cm, axilla negative, biopsy revealed grade 1 IDC ER 95%, PR 1%, HER2 equivocal by IHC, FISH negative, Ki-67 5%   08/31/2020 Cancer Staging   Staging form: Breast, AJCC 8th Edition - Clinical stage from 08/31/2020: Stage IA (cT1b, cN0, cM0, G1, ER+, PR+, HER2-) - Signed by [name] Lose, MD on 08/31/2020 Stage prefix: Initial diagnosis Histologic grading system: 3 grade system   09/13/2020 Genetic Testing   Negative.  Genes Tested include: APC, ATM, AXIN2, BARD1, BMPR1A, BRCA1, BRCA2, BRIP1, CDH1, CDK4, CDKN2A, CHEK2, DICER1, EPCAM, GREM1, HOXB13, MEN1, MLH1, MSH2, MSH3, MSH6, MUTYH, NBN, NF1, NF2, NTHL1, PALB2, PMS2, POLD1, POLE, PTEN, RAD51C, RAD51D, RECQL, RET, SDHA, SDHAF2, SDHB, SDHC, SDHD, SMAD4, SMARCA4, STK11, TP53, TSC1, TSC2, and VHL.    10/04/2020 Surgery   Left lumpectomy (Dr. Jovita [name]): 0.9 cm, grade 1 invasive ductal carcinoma with resection margins negative for carcinoma   10/04/2020 Cancer Staging   Staging form: Breast, AJCC 8th Edition - Pathologic stage from 10/04/2020: Stage IA  (pT1b, pN0, cM0, G1, ER+, PR+, HER2-) - Signed by [name] Phlegm, NP on 01/15/2021 Stage prefix: Initial diagnosis Histologic grading system: 3 grade system     CURRENT THERAPY: observation  INTERVAL HISTORY: [name] [name] 89 y.o. female returns for evaluation of left breast pain.  This has been going on for the past week and she says today is slightly better than previous.  She denies any erythema, swelling, and has not noticed any tenderness.   She continues on surveillance alone as she did not tolerate the letrozole and considering the side effects and her age they opted to forego additional adjuvant antiestrogen therapy.  Most recent mammogram occurred on October 05, 2021 demonstrating no mammographic evidence of malignancy and breast density category B.    Patient Active Problem List   Diagnosis Date Noted   Genetic testing 11/03/2020   Family history of breast cancer 09/01/2020   Personal history of skin cancer 09/01/2020   Malignant neoplasm of upper-outer quadrant of left breast in female, estrogen receptor positive ([place]) 08/26/2020   Hip fracture ([place]) 10/19/2018   Sick sinus syndrome (HCC)    Presence of permanent cardiac pacemaker    Paroxysmal atrial fibrillation (HCC)    Osteoarthritis    Hypothyroidism    Hypertension    Hyperlipidemia    Hemorrhoids    GERD (gastroesophageal reflux disease)    Diverticulosis    Coronary artery disease    Complication of anesthesia    Aortic stenosis, severe    Pericarditis 01/31/2017   Acute on chronic diastolic heart failure ([place]) 01/30/2017   Acute blood loss as cause of postoperative anemia 01/30/2017  S/P TAVR (transcatheter aortic valve replacement) 01/29/2017   Anemia    CKD (chronic [name] disease)    CAD in native artery 12/20/2016   Anticoagulated 05/31/2016   Pacemaker-Medtronic 01/01/2012   Long term current use of anticoagulant therapy 06/18/2011   Severe aortic stenosis 09/01/2010    BRADYCARDIA-TACHYCARDIA SYNDROME 03/31/2010   HLD (hyperlipidemia) 03/30/2010   Essential hypertension 03/30/2010   ATRIAL FIBRILLATION 03/30/2010   OSTEOARTHRITIS 03/30/2010    is allergic to penicillins, tetanus toxoid, demerol, clindamycin/lincomycin, letrozole, meningococcal poly tetanus conj vaccine acwy, moxifloxacin hcl, and codeine.  MEDICAL HISTORY: Past Medical History:  Diagnosis Date   CKD (chronic [name] disease)    Complication of anesthesia    Coronary artery disease    a. 12/20/2016: s/p DES to LAD    Diverticulosis    Family history of adverse reaction to anesthesia    sister also has post-op n/v   Family history of breast cancer 09/01/2020   GERD (gastroesophageal reflux disease)    Hemorrhoids    Hyperlipidemia    Hypertension    Hypothyroidism    Osteoarthritis    Paroxysmal atrial fibrillation (HCC)    a. on [place] with Xarelto but switched to Eliquis after stenting.    Personal history of skin cancer 09/01/2020   PONV (postoperative nausea and vomiting)    Presence of permanent cardiac pacemaker    S/P TAVR (transcatheter aortic valve replacement) 01/29/2017   23 mm Edwards Sapien 3 transcatheter heart valve placed via percutaneous right transfemoral approach     SURGICAL HISTORY: Past Surgical History:  Procedure Laterality Date   ABDOMINAL HYSTERECTOMY  1980   BASAL CELL CARCINOMA EXCISION     BREAST LUMPECTOMY WITH RADIOACTIVE SEED LOCALIZATION Left 10/04/2020   Procedure: LEFT BREAST LUMPECTOMY WITH RADIOACTIVE SEED LOCALIZATION;  Surgeon: [name] Kussmaul, MD;  Location: [facility] OR;  Service: General;  Laterality: Left;   CARDIAC CATHETERIZATION  ~ 11/2016   CATARACT EXTRACTION W/ INTRAOCULAR LENS  IMPLANT, BILATERAL Bilateral    CORONARY ANGIOPLASTY WITH STENT PLACEMENT  12/20/2016   "1 stent"   CORONARY STENT INTERVENTION N/A 12/20/2016   Procedure: CORONARY STENT INTERVENTION;  Surgeon: Martinique, Peter M, MD;  Location: [facility];  Service:  Cardiovascular;  Laterality: N/A;   FINGER SURGERY Left    "fell; developed skiers thumb; had to operate on it"   INSERT / REPLACE / [place]   for syptomatic bradycardia and syncope -- in [place] (IM) NAIL INTERTROCHANTERIC Left 10/19/2018   Procedure: INTRAMEDULLARY (IM) NAIL INTERTROCHANTRIC;  Surgeon: Altamese [place], MD;  Location: [facility];  Service: Orthopedics;  Laterality: Left;   LEAD REVISION/REPAIR N/A 10/09/2016   New left subclavian MDT Adapta L PPM dual chamber system implanted by Dr [name] with previously placed R subclavian system abandoned   PACEMAKER GENERATOR CHANGE  2001   pulse generator replacement by Dr. Pincus Large GENERATOR CHANGE  04/01/2008   PPM Medtronic -- model # [id number] serial # [id number] -- pulse generator replacement by Dr. Verlon Setting    RIGHT/LEFT [place] N/A 11/27/2016   Procedure: RIGHT/LEFT HEART CATH AND CORONARY ANGIOGRAPHY;  Surgeon: Martinique, Peter M, MD;  Location: [facility];  Service: Cardiovascular;  Laterality: N/A;   SQUAMOUS CELL CARCINOMA EXCISION     TEE WITHOUT CARDIOVERSION N/A 01/29/2017   Procedure: TRANSESOPHAGEAL ECHOCARDIOGRAM (TEE);  Surgeon: Sherren Mocha, MD;  Location: [facility];  Service: Open Heart Surgery;  Laterality: N/A;  TONSILLECTOMY     TRANSCATHETER AORTIC VALVE REPLACEMENT, TRANSFEMORAL N/A 01/29/2017   Procedure: TRANSCATHETER AORTIC VALVE REPLACEMENT, TRANSFEMORAL;  Surgeon: Sherren Mocha, MD;  Location: [facility];  Service: Open Heart Surgery;  Laterality: N/A;    SOCIAL HISTORY: Social History   Socioeconomic History   Marital status: Widowed    Spouse name: Not on file   Number of children: 0   Years of education: Not on file   Highest education level: Not on file  Occupational History    Employer: RETIRED  Tobacco Use   Smoking status: Former    Packs/day: 1.50    Years: 30.00    Total pack years: 45.00    Types: Cigarettes    Quit  date: 03/05/1978    Years since quitting: 44.1   Smokeless tobacco: Never  Vaping Use   Vaping Use: Never used  Substance and Sexual Activity   Alcohol use: No   Drug use: No   Sexual activity: Not on file  Other Topics Concern   Not on file  Social History Narrative   Not on file   Social Determinants of Health   Financial Resource Strain: Low Risk  (01/16/2021)   Overall Financial Resource Strain (CARDIA)    Difficulty of Paying Living Expenses: Not hard at all  Food Insecurity: No Food Insecurity (01/16/2021)   Hunger Vital Sign    Worried About Running Out of Food in the Last Year: Never true    [place] in the Last Year: Never true  Transportation Needs: No Transportation Needs (01/16/2021)   PRAPARE - Hydrologist (Medical): No    Lack of Transportation (Non-Medical): No  Physical Activity: Insufficiently Active (01/16/2021)   Exercise Vital Sign    Days of Exercise per Week: 2 days    Minutes of Exercise per Session: 30 min  Stress: No Stress Concern Present (01/16/2021)   [place]    Feeling of Stress : Not at all  Social Connections: Moderately Isolated (01/16/2021)   Social Connection and Isolation Panel [NHANES]    Frequency of Communication with Friends and Family: Three times a week    Frequency of Social Gatherings with Friends and Family: Twice a week    Attends Religious Services: 1 to 4 times per year    Active Member of [employer] or Organizations: No    Attends Archivist Meetings: Never    Marital Status: Widowed  Intimate Partner Violence: Not At Risk (01/16/2021)   Humiliation, Afraid, Rape, and Kick questionnaire    Fear of Current or Ex-Partner: No    Emotionally Abused: No    Physically Abused: No    Sexually Abused: No    FAMILY HISTORY: Family History  Problem Relation Age of Onset   Alzheimer's disease Mother    [name] cancer  Father        dx 18s   Breast cancer Maternal Aunt 61   Breast cancer Cousin        maternal cousin, dx >50    Review of Systems  Constitutional:  Negative for appetite change, chills, fatigue, fever and unexpected weight change.  HENT:   Negative for hearing loss, lump/mass and trouble swallowing.   Eyes:  Negative for eye problems and icterus.  Respiratory:  Negative for chest tightness, cough and shortness of breath.   Cardiovascular:  Negative for chest pain, leg swelling and palpitations.  Gastrointestinal:  Negative for  abdominal distention, abdominal pain, constipation, diarrhea, nausea and vomiting.  Endocrine: Negative for hot flashes.  Genitourinary:  Negative for difficulty urinating.   Musculoskeletal:  Negative for arthralgias.  Skin:  Negative for itching and rash.  Neurological:  Negative for dizziness, extremity weakness, headaches and numbness.  Hematological:  Negative for adenopathy. Does not bruise/bleed easily.  Psychiatric/Behavioral:  Negative for depression. The patient is not nervous/anxious.       PHYSICAL EXAMINATION  ECOG PERFORMANCE STATUS: 1 - Symptomatic but completely ambulatory  Vitals:   04/17/22 1146  BP: (!) 141/64  Pulse: 84  Resp: 18  Temp: 97.9 F (36.6 C)  SpO2: 96%    Physical Exam Constitutional:      General: She is not in acute distress.    Appearance: Normal appearance. She is not toxic-appearing.  HENT:     Head: Normocephalic and atraumatic.  Eyes:     General: No scleral icterus. Cardiovascular:     Rate and Rhythm: Normal rate and regular rhythm.     Pulses: Normal pulses.     Heart sounds: Normal heart sounds.  Pulmonary:     Effort: Pulmonary effort is normal.     Breath sounds: Normal breath sounds.  Chest:     Comments: Right breast is benign left breast status postlumpectomy, there is a very small 3 mm well-circumscribed nodule at the lumpectomy site. Abdominal:     General: Abdomen is flat. Bowel sounds are  normal. There is no distension.     Palpations: Abdomen is soft.     Tenderness: There is no abdominal tenderness.  Musculoskeletal:        General: No swelling.     Cervical back: Neck supple.  Lymphadenopathy:     Cervical: No cervical adenopathy.  Skin:    General: Skin is warm and dry.     Findings: No rash.  Neurological:     General: No focal deficit present.     Mental Status: She is alert.  Psychiatric:        Mood and Affect: Mood normal.        Behavior: Behavior normal.     LABORATORY DATA: None for this visit  ASSESSMENT and THERAPY PLAN:   Malignant neoplasm of upper-outer quadrant of left breast in female, estrogen receptor positive ([place]) [name] [name] is a 89 year old woman with history of left breast stage Ia ER/PR positive breast cancer diagnosed in June 2022 status postlumpectomy and unable to tolerate antiestrogen therapy.  She continues on observation alone.  She does have a new weeklong history of left breast pain.  Her breast exam does reveal a very small well-circumscribed nodule that is compatible with an oil cyst.  However considering her history we will obtain mammogram and ultrasound to make sure this is the case.  I reviewed the above recommendations with [name] [name] and let her know that I have a low suspicion for cancer but we do need to do the mammogram and ultrasound to ensure that the nodule is a cyst.  Left breast mammogram and ultrasound orders were placed and my nurse is sending these [facility] to [facility] to get [facility] scheduled.    She has follow-up later this year with Dr. Lindi Adie which I recommended that she keep.  All questions were answered. The patient knows to call the clinic with any problems, questions or concerns. We can certainly see the patient much sooner if necessary.  Total encounter time:20 minutes*in face-to-face visit time, chart review, lab review, care coordination, order entry,  and documentation of the encounter time.    Wilber Bihari, NP  04/17/22 12:40 PM Medical Oncology and Hematology [facility] [place], [place] [address] Tel. [phone]    Fax. [phone]  *Total Encounter Time as defined by the Centers for Medicare and Medicaid Services includes, in addition to the face-to-face time of a patient visit (documented in the note above) non-face-to-face time: obtaining and reviewing outside history, ordering and reviewing medications, tests or procedures, care coordination (communications with other health care professionals or caregivers) and documentation in the medical record.

## 2022-04-17 NOTE — Progress Notes (Signed)
Faxed MM/US orders to [facility] with receipt confirmation. Called and LVM with Isa Rankin, patient care coordinator of [facility] asking for urgent appt and a call back. Gave RN call back number and Pt's call back number.

## 2022-04-20 DIAGNOSIS — R928 Other abnormal and inconclusive findings on diagnostic imaging of breast: Secondary | ICD-10-CM | POA: Diagnosis not present

## 2022-04-20 DIAGNOSIS — N644 Mastodynia: Secondary | ICD-10-CM | POA: Diagnosis not present

## 2022-04-20 DIAGNOSIS — Z853 Personal history of malignant neoplasm of breast: Secondary | ICD-10-CM | POA: Diagnosis not present

## 2022-04-24 NOTE — Progress Notes (Signed)
Remote pacemaker transmission.   

## 2022-05-08 ENCOUNTER — Telehealth: Payer: Self-pay | Admitting: Internal Medicine

## 2022-05-08 DIAGNOSIS — M545 Low back pain, unspecified: Secondary | ICD-10-CM | POA: Diagnosis not present

## 2022-05-08 DIAGNOSIS — M25552 Pain in left hip: Secondary | ICD-10-CM | POA: Diagnosis not present

## 2022-05-08 DIAGNOSIS — M7062 Trochanteric bursitis, left hip: Secondary | ICD-10-CM | POA: Diagnosis not present

## 2022-05-08 NOTE — Telephone Encounter (Signed)
Patient called to reschedule her appointment, Patient is down with her back. Patient aware of date and time of appointment.

## 2022-05-10 ENCOUNTER — Ambulatory Visit: Payer: Medicare PPO | Admitting: Hematology and Oncology

## 2022-05-13 ENCOUNTER — Other Ambulatory Visit: Payer: Self-pay | Admitting: Cardiology

## 2022-05-17 DIAGNOSIS — M9903 Segmental and somatic dysfunction of lumbar region: Secondary | ICD-10-CM | POA: Diagnosis not present

## 2022-05-17 DIAGNOSIS — M5441 Lumbago with sciatica, right side: Secondary | ICD-10-CM | POA: Diagnosis not present

## 2022-05-17 DIAGNOSIS — M9902 Segmental and somatic dysfunction of thoracic region: Secondary | ICD-10-CM | POA: Diagnosis not present

## 2022-05-17 DIAGNOSIS — M47816 Spondylosis without myelopathy or radiculopathy, lumbar region: Secondary | ICD-10-CM | POA: Diagnosis not present

## 2022-05-18 DIAGNOSIS — M25552 Pain in left hip: Secondary | ICD-10-CM | POA: Diagnosis not present

## 2022-05-18 DIAGNOSIS — M545 Low back pain, unspecified: Secondary | ICD-10-CM | POA: Diagnosis not present

## 2022-05-21 ENCOUNTER — Encounter: Payer: Self-pay | Admitting: Adult Health

## 2022-05-21 ENCOUNTER — Encounter: Payer: Self-pay | Admitting: Hematology and Oncology

## 2022-05-23 DIAGNOSIS — M7062 Trochanteric bursitis, left hip: Secondary | ICD-10-CM | POA: Diagnosis not present

## 2022-05-23 DIAGNOSIS — M1712 Unilateral primary osteoarthritis, left knee: Secondary | ICD-10-CM | POA: Diagnosis not present

## 2022-05-23 DIAGNOSIS — S72142D Displaced intertrochanteric fracture of left femur, subsequent encounter for closed fracture with routine healing: Secondary | ICD-10-CM | POA: Diagnosis not present

## 2022-05-23 DIAGNOSIS — M1612 Unilateral primary osteoarthritis, left hip: Secondary | ICD-10-CM | POA: Diagnosis not present

## 2022-05-28 ENCOUNTER — Other Ambulatory Visit: Payer: Self-pay | Admitting: Orthopedic Surgery

## 2022-05-28 DIAGNOSIS — M1612 Unilateral primary osteoarthritis, left hip: Secondary | ICD-10-CM

## 2022-05-29 DIAGNOSIS — H532 Diplopia: Secondary | ICD-10-CM | POA: Diagnosis not present

## 2022-05-29 DIAGNOSIS — H353132 Nonexudative age-related macular degeneration, bilateral, intermediate dry stage: Secondary | ICD-10-CM | POA: Diagnosis not present

## 2022-05-30 NOTE — Progress Notes (Signed)
Patient Care Team: [name], MD as PCP - General (Internal Medicine) Martinique, Peter M, MD as PCP - Cardiology (Cardiology) Constance Haw, MD as PCP - Electrophysiology (Cardiology) Gery Pray, MD as Consulting Physician (Radiation Oncology) Nicholas Lose, MD as Consulting Physician (Hematology and Oncology) Jovita Kussmaul, MD as Consulting Physician (General Surgery)  DIAGNOSIS: No diagnosis found.  SUMMARY OF ONCOLOGIC HISTORY: Oncology History  Malignant neoplasm of upper-outer quadrant of left breast in female, estrogen receptor positive ([place])  08/26/2020 Initial Diagnosis   Screening detected left breast mass 0.8 cm, axilla negative, biopsy revealed grade 1 IDC ER 95%, PR 1%, HER2 equivocal by IHC, FISH negative, Ki-67 5%   08/31/2020 Cancer Staging   Staging form: Breast, AJCC 8th Edition - Clinical stage from 08/31/2020: Stage IA (cT1b, cN0, cM0, G1, ER+, PR+, HER2-) - Signed by Nicholas Lose, MD on 08/31/2020 Stage prefix: Initial diagnosis Histologic grading system: 3 grade system   09/13/2020 Genetic Testing   Negative.  Genes Tested include: APC, ATM, AXIN2, BARD1, BMPR1A, BRCA1, BRCA2, BRIP1, CDH1, CDK4, CDKN2A, CHEK2, DICER1, EPCAM, GREM1, HOXB13, MEN1, MLH1, MSH2, MSH3, MSH6, MUTYH, NBN, NF1, NF2, NTHL1, PALB2, PMS2, POLD1, POLE, PTEN, RAD51C, RAD51D, RECQL, RET, SDHA, SDHAF2, SDHB, SDHC, SDHD, SMAD4, SMARCA4, STK11, TP53, TSC1, TSC2, and VHL.    10/04/2020 Surgery   Left lumpectomy (Dr. Jovita Kussmaul): 0.9 cm, grade 1 invasive ductal carcinoma with resection margins negative for carcinoma   10/04/2020 Cancer Staging   Staging form: Breast, AJCC 8th Edition - Pathologic stage from 10/04/2020: Stage IA (pT1b, pN0, cM0, G1, ER+, PR+, HER2-) - Signed by Gardenia Phlegm, NP on 01/15/2021 Stage prefix: Initial diagnosis Histologic grading system: 3 grade system     CHIEF COMPLIANT: Follow-up of left breast cancer   INTERVAL HISTORY: [name] [name] is a 89  y.o. with above-mentioned history of left breast cancer having undergone lumpectomy. She reports to the clinic to day for follow-up.     ALLERGIES:  is allergic to penicillins, tetanus toxoid, demerol, clindamycin/lincomycin, letrozole, meningococcal poly tetanus conj vaccine acwy, moxifloxacin hcl, and codeine.  MEDICATIONS:  Current Outpatient Medications  Medication Sig Dispense Refill   amiodarone (PACERONE) 200 MG tablet TAKE 1/2 TABLET(100 MG) BY MOUTH DAILY 45 tablet 3   amLODipine (NORVASC) 5 MG tablet TAKE 1 TABLET(5 MG) BY MOUTH DAILY 90 tablet 3   apixaban (ELIQUIS) 2.5 MG TABS tablet Take 1 tablet (2.5 mg total) by mouth 2 (two) times daily. 180 tablet 3   calcium carbonate (OS-CAL) 600 MG TABS Take 600 mg by mouth 2 (two) times daily with a meal.     ezetimibe (ZETIA) 10 MG tablet Take 1 tablet (10 mg total) by mouth daily. TAKE 1 TABLET(10 MG) BY MOUTH DAILY Strength: 10 mg 90 tablet 3   feeding supplement, ENSURE ENLIVE, (ENSURE ENLIVE) LIQD Take 237 mLs by mouth 2 (two) times daily between meals. (Patient taking differently: Take 237 mLs by mouth daily.) 237 mL 12   fluticasone (FLONASE) 50 MCG/ACT nasal spray Place 2 sprays into both nostrils daily as needed for allergies (congestion).     furosemide (LASIX) 20 MG tablet Take one tablet (20mg ) every other day. 90 tablet 3   losartan (COZAAR) 25 MG tablet TAKE 1 TABLET(25 MG) BY MOUTH DAILY 90 tablet 1   metoprolol tartrate (LOPRESSOR) 25 MG tablet Take 1.5 tablets (37.5 mg total) by mouth 2 (two) times daily. 270 tablet 3   Multiple Vitamins-Minerals (PRESERVISION AREDS 2) CAPS Take 1  capsule by mouth 2 (two) times daily.     nitroGLYCERIN (NITROSTAT) 0.4 MG SL tablet For chest pain, tightness, or pressure. While sitting, place 1 tablet under tongue. May be used every 5 minutes as needed, for up to 15 minutes. Do not use more than 3 tablets. 25 tablet 12   pantoprazole (PROTONIX) 40 MG tablet Take 1 tablet (40 mg total) by mouth  2 (two) times daily before a meal. 180 tablet 3   polyvinyl alcohol (LIQUIFILM TEARS) 1.4 % ophthalmic solution Place 1-2 drops into both eyes 3 (three) times daily as needed for dry eyes.     SYNTHROID 125 MCG tablet Take 125 mcg by mouth daily before breakfast.      No current facility-administered medications for this visit.    PHYSICAL EXAMINATION: ECOG PERFORMANCE STATUS: {CHL ONC ECOG PS:[phone]}  There were no vitals filed for this visit. There were no vitals filed for this visit.  BREAST:*** No palpable masses or nodules in either right or left breasts. No palpable axillary supraclavicular or infraclavicular adenopathy no breast tenderness or nipple discharge. (exam performed in the presence of a chaperone)  LABORATORY DATA:  I have reviewed the data as listed    Latest Ref Rng & Units 09/19/2021   10:46 AM 12/16/2020    4:04 PM 09/27/2020    2:49 PM  CMP  Glucose 70 - 99 mg/dL 74  114  98   BUN 10 - 36 mg/dL 34  38  33   Creatinine 0.57 - 1.00 mg/dL 1.63  1.24  1.25   Sodium 134 - 144 mmol/L 137  137  134   Potassium 3.5 - 5.2 mmol/L 4.6  4.5  4.6   Chloride 96 - 106 mmol/L 97  100  98   CO2 20 - 29 mmol/L 27  29  30    Calcium 8.7 - 10.3 mg/dL 10.1  9.9  9.8   Total Protein 6.0 - 8.5 g/dL 6.4     Total Bilirubin 0.0 - 1.2 mg/dL 0.7     Alkaline Phos 44 - 121 IU/L 88     AST 0 - 40 IU/L 27     ALT 0 - 32 IU/L 15       Lab Results  Component Value Date   WBC 6.1 12/16/2020   HGB 13.4 12/16/2020   HCT 41.3 12/16/2020   MCV 105.4 (H) 12/16/2020   PLT 166 12/16/2020   NEUTROABS 4.2 08/31/2020    ASSESSMENT & PLAN:  No problem-specific Assessment & Plan notes found for this encounter.    No orders of the defined types were placed in this encounter.  The patient has a good understanding of the overall plan. she agrees with it. she will call with any problems that may develop before the next visit here. Total time spent: 30 mins including face to face time  and time spent for planning, charting and co-ordination of care   Suzzette Righter, [place] 05/30/22

## 2022-06-01 ENCOUNTER — Ambulatory Visit
Admission: RE | Admit: 2022-06-01 | Discharge: 2022-06-01 | Disposition: A | Discharge status: Home / Self Care | Payer: Medicare PPO | Source: Ambulatory Visit | Attending: Orthopedic Surgery | Admitting: Orthopedic Surgery

## 2022-06-01 DIAGNOSIS — M1612 Unilateral primary osteoarthritis, left hip: Secondary | ICD-10-CM | POA: Diagnosis not present

## 2022-06-01 MED ORDER — IOPAMIDOL (ISOVUE-M 200) INJECTION 41%
1.0000 mL | Freq: Once | INTRAMUSCULAR | Status: AC
  Administered 2022-06-01: 1 mL via INTRA_ARTICULAR

## 2022-06-01 MED ORDER — METHYLPREDNISOLONE ACETATE 40 MG/ML INJ SUSP (RADIOLOG
80.0000 mg | Freq: Once | INTRAMUSCULAR | Status: AC
  Administered 2022-06-01: 80 mg via INTRA_ARTICULAR

## 2022-06-07 ENCOUNTER — Other Ambulatory Visit: Payer: Self-pay

## 2022-06-07 ENCOUNTER — Inpatient Hospital Stay: Payer: Medicare PPO | Attending: Adult Health | Admitting: Hematology and Oncology

## 2022-06-07 VITALS — BP 108/64 | HR 80 | Temp 97.2°F | Resp 17 | Wt 116.4 lb

## 2022-06-07 DIAGNOSIS — Z170 Estrogen receptor positive status [ER+]: Secondary | ICD-10-CM | POA: Insufficient documentation

## 2022-06-07 DIAGNOSIS — C50412 Malignant neoplasm of upper-outer quadrant of left female breast: Secondary | ICD-10-CM | POA: Diagnosis not present

## 2022-06-07 NOTE — Assessment & Plan Note (Addendum)
June 2022:Screening detected left breast mass 0.8 cm, axilla negative, biopsy revealed grade 1 IDC ER 95%, PR 1%, HER2 equivocal by IHC, FISH negative, Ki-67 5%   10/04/2020:Left lumpectomy (Dr. Autumn Messing): 0.9 cm, grade 1 invasive ductal carcinoma with resection margins negative for carcinoma   Treatment plan: 1.  Given the favorable type of breast cancer and the size and negative margins, we decided not to pursue radiation. 2.  Adjuvant antiestrogen therapy with letrozole 2.5 mg daily (stopped because of side effects) We decided not to prescribe her any other antiestrogen treatments given her age and her side effects to letrozole.   Breast Cancer Surveillance: 1. Breast exam 06/07/2022: Normal 2. Mammogram and ultrasound at [facility] 04/20/2022: Benign (done because of breast pain)   Return to clinic in 1 year for follow-up

## 2022-06-16 DIAGNOSIS — S81801A Unspecified open wound, right lower leg, initial encounter: Secondary | ICD-10-CM | POA: Diagnosis not present

## 2022-06-20 ENCOUNTER — Other Ambulatory Visit: Payer: Self-pay | Admitting: Orthopedic Surgery

## 2022-06-20 DIAGNOSIS — M1712 Unilateral primary osteoarthritis, left knee: Secondary | ICD-10-CM | POA: Diagnosis not present

## 2022-06-20 DIAGNOSIS — M1612 Unilateral primary osteoarthritis, left hip: Secondary | ICD-10-CM

## 2022-06-20 DIAGNOSIS — M7062 Trochanteric bursitis, left hip: Secondary | ICD-10-CM | POA: Diagnosis not present

## 2022-06-20 DIAGNOSIS — S51811A Laceration without foreign body of right forearm, initial encounter: Secondary | ICD-10-CM | POA: Diagnosis not present

## 2022-06-20 DIAGNOSIS — S72142D Displaced intertrochanteric fracture of left femur, subsequent encounter for closed fracture with routine healing: Secondary | ICD-10-CM | POA: Diagnosis not present

## 2022-06-26 ENCOUNTER — Ambulatory Visit (INDEPENDENT_AMBULATORY_CARE_PROVIDER_SITE_OTHER): Payer: Medicare PPO

## 2022-06-26 DIAGNOSIS — R251 Tremor, unspecified: Secondary | ICD-10-CM | POA: Diagnosis not present

## 2022-06-26 DIAGNOSIS — D692 Other nonthrombocytopenic purpura: Secondary | ICD-10-CM | POA: Diagnosis not present

## 2022-06-26 DIAGNOSIS — E039 Hypothyroidism, unspecified: Secondary | ICD-10-CM | POA: Diagnosis not present

## 2022-06-26 DIAGNOSIS — I700 Atherosclerosis of aorta: Secondary | ICD-10-CM | POA: Diagnosis not present

## 2022-06-26 DIAGNOSIS — I480 Paroxysmal atrial fibrillation: Secondary | ICD-10-CM | POA: Diagnosis not present

## 2022-06-26 DIAGNOSIS — I495 Sick sinus syndrome: Secondary | ICD-10-CM

## 2022-06-26 DIAGNOSIS — R296 Repeated falls: Secondary | ICD-10-CM | POA: Diagnosis not present

## 2022-06-26 DIAGNOSIS — I130 Hypertensive heart and chronic kidney disease with heart failure and stage 1 through stage 4 chronic kidney disease, or unspecified chronic kidney disease: Secondary | ICD-10-CM | POA: Diagnosis not present

## 2022-06-26 DIAGNOSIS — R634 Abnormal weight loss: Secondary | ICD-10-CM | POA: Diagnosis not present

## 2022-06-27 DIAGNOSIS — E038 Other specified hypothyroidism: Secondary | ICD-10-CM | POA: Diagnosis not present

## 2022-06-27 DIAGNOSIS — Z9181 History of falling: Secondary | ICD-10-CM | POA: Diagnosis not present

## 2022-06-27 DIAGNOSIS — Z7901 Long term (current) use of anticoagulants: Secondary | ICD-10-CM | POA: Diagnosis not present

## 2022-06-27 DIAGNOSIS — S51811D Laceration without foreign body of right forearm, subsequent encounter: Secondary | ICD-10-CM | POA: Diagnosis not present

## 2022-06-27 DIAGNOSIS — S81811D Laceration without foreign body, right lower leg, subsequent encounter: Secondary | ICD-10-CM | POA: Diagnosis not present

## 2022-06-27 DIAGNOSIS — I4891 Unspecified atrial fibrillation: Secondary | ICD-10-CM | POA: Diagnosis not present

## 2022-06-27 DIAGNOSIS — I10 Essential (primary) hypertension: Secondary | ICD-10-CM | POA: Diagnosis not present

## 2022-06-28 DIAGNOSIS — E785 Hyperlipidemia, unspecified: Secondary | ICD-10-CM | POA: Diagnosis not present

## 2022-06-28 DIAGNOSIS — I10 Essential (primary) hypertension: Secondary | ICD-10-CM | POA: Diagnosis not present

## 2022-06-28 DIAGNOSIS — S81801D Unspecified open wound, right lower leg, subsequent encounter: Secondary | ICD-10-CM | POA: Diagnosis not present

## 2022-06-28 LAB — CUP PACEART REMOTE DEVICE CHECK
Battery Impedance: 557 Ohm
Battery Remaining Longevity: 78 mo
Battery Voltage: 2.77 V
Brady Statistic AP VP Percent: 0 %
Brady Statistic AP VS Percent: 96 %
Brady Statistic AS VP Percent: 0 %
Brady Statistic AS VS Percent: 4 %
Date Time Interrogation Session: 20240425095746
Implantable Lead Connection Status: 753985
Implantable Lead Connection Status: 753985
Implantable Lead Implant Date: 20180807
Implantable Lead Implant Date: 20180807
Implantable Lead Location: 753859
Implantable Lead Location: 753860
Implantable Lead Model: 5076
Implantable Lead Model: 5076
Implantable Pulse Generator Implant Date: 20180807
Lead Channel Impedance Value: 310 Ohm
Lead Channel Impedance Value: 513 Ohm
Lead Channel Pacing Threshold Amplitude: 0.625 V
Lead Channel Pacing Threshold Amplitude: 0.75 V
Lead Channel Pacing Threshold Pulse Width: 0.4 ms
Lead Channel Pacing Threshold Pulse Width: 0.4 ms
Lead Channel Setting Pacing Amplitude: 2 V
Lead Channel Setting Pacing Amplitude: 2.5 V
Lead Channel Setting Pacing Pulse Width: 0.4 ms
Lead Channel Setting Sensing Sensitivity: 5.6 mV
Zone Setting Status: 755011
Zone Setting Status: 755011

## 2022-07-02 ENCOUNTER — Ambulatory Visit
Admission: RE | Admit: 2022-07-02 | Discharge: 2022-07-02 | Disposition: A | Discharge status: Home / Self Care | Payer: Medicare PPO | Source: Ambulatory Visit | Attending: Orthopedic Surgery | Admitting: Orthopedic Surgery

## 2022-07-02 DIAGNOSIS — G8929 Other chronic pain: Secondary | ICD-10-CM | POA: Diagnosis not present

## 2022-07-02 DIAGNOSIS — M25552 Pain in left hip: Secondary | ICD-10-CM | POA: Diagnosis not present

## 2022-07-02 DIAGNOSIS — M1612 Unilateral primary osteoarthritis, left hip: Secondary | ICD-10-CM

## 2022-07-02 MED ORDER — METHYLPREDNISOLONE ACETATE 40 MG/ML INJ SUSP (RADIOLOG
80.0000 mg | Freq: Once | INTRAMUSCULAR | Status: AC
  Administered 2022-07-02: 80 mg via INTRA_ARTICULAR

## 2022-07-02 MED ORDER — IOPAMIDOL (ISOVUE-M 200) INJECTION 41%
1.0000 mL | Freq: Once | INTRAMUSCULAR | Status: AC
  Administered 2022-07-02: 1 mL via INTRA_ARTICULAR

## 2022-07-03 ENCOUNTER — Encounter (HOSPITAL_BASED_OUTPATIENT_CLINIC_OR_DEPARTMENT_OTHER): Payer: Medicare PPO | Attending: Internal Medicine | Admitting: Internal Medicine

## 2022-07-03 DIAGNOSIS — L97812 Non-pressure chronic ulcer of other part of right lower leg with fat layer exposed: Secondary | ICD-10-CM | POA: Diagnosis not present

## 2022-07-03 DIAGNOSIS — I87311 Chronic venous hypertension (idiopathic) with ulcer of right lower extremity: Secondary | ICD-10-CM | POA: Diagnosis not present

## 2022-07-03 DIAGNOSIS — I480 Paroxysmal atrial fibrillation: Secondary | ICD-10-CM | POA: Diagnosis not present

## 2022-07-03 DIAGNOSIS — Z08 Encounter for follow-up examination after completed treatment for malignant neoplasm: Secondary | ICD-10-CM | POA: Insufficient documentation

## 2022-07-03 DIAGNOSIS — I10 Essential (primary) hypertension: Secondary | ICD-10-CM | POA: Diagnosis not present

## 2022-07-03 DIAGNOSIS — Z853 Personal history of malignant neoplasm of breast: Secondary | ICD-10-CM | POA: Diagnosis not present

## 2022-07-03 DIAGNOSIS — Z950 Presence of cardiac pacemaker: Secondary | ICD-10-CM | POA: Insufficient documentation

## 2022-07-03 DIAGNOSIS — E785 Hyperlipidemia, unspecified: Secondary | ICD-10-CM | POA: Diagnosis not present

## 2022-07-03 DIAGNOSIS — M1990 Unspecified osteoarthritis, unspecified site: Secondary | ICD-10-CM | POA: Insufficient documentation

## 2022-07-03 DIAGNOSIS — I2510 Atherosclerotic heart disease of native coronary artery without angina pectoris: Secondary | ICD-10-CM | POA: Diagnosis not present

## 2022-07-03 DIAGNOSIS — T798XXA Other early complications of trauma, initial encounter: Secondary | ICD-10-CM | POA: Diagnosis not present

## 2022-07-03 DIAGNOSIS — Z7901 Long term (current) use of anticoagulants: Secondary | ICD-10-CM | POA: Diagnosis not present

## 2022-07-03 DIAGNOSIS — W2209XA Striking against other stationary object, initial encounter: Secondary | ICD-10-CM | POA: Insufficient documentation

## 2022-07-03 DIAGNOSIS — I872 Venous insufficiency (chronic) (peripheral): Secondary | ICD-10-CM | POA: Diagnosis not present

## 2022-07-03 DIAGNOSIS — Z952 Presence of prosthetic heart valve: Secondary | ICD-10-CM | POA: Diagnosis not present

## 2022-07-03 DIAGNOSIS — R7989 Other specified abnormal findings of blood chemistry: Secondary | ICD-10-CM | POA: Diagnosis not present

## 2022-07-04 DIAGNOSIS — H43813 Vitreous degeneration, bilateral: Secondary | ICD-10-CM | POA: Diagnosis not present

## 2022-07-04 DIAGNOSIS — H353131 Nonexudative age-related macular degeneration, bilateral, early dry stage: Secondary | ICD-10-CM | POA: Diagnosis not present

## 2022-07-04 DIAGNOSIS — H532 Diplopia: Secondary | ICD-10-CM | POA: Diagnosis not present

## 2022-07-04 DIAGNOSIS — H35372 Puckering of macula, left eye: Secondary | ICD-10-CM | POA: Diagnosis not present

## 2022-07-05 ENCOUNTER — Encounter (HOSPITAL_BASED_OUTPATIENT_CLINIC_OR_DEPARTMENT_OTHER): Payer: Medicare PPO | Admitting: Internal Medicine

## 2022-07-05 DIAGNOSIS — H04123 Dry eye syndrome of bilateral lacrimal glands: Secondary | ICD-10-CM | POA: Diagnosis not present

## 2022-07-05 DIAGNOSIS — H353132 Nonexudative age-related macular degeneration, bilateral, intermediate dry stage: Secondary | ICD-10-CM | POA: Diagnosis not present

## 2022-07-05 DIAGNOSIS — H532 Diplopia: Secondary | ICD-10-CM | POA: Diagnosis not present

## 2022-07-09 ENCOUNTER — Emergency Department (HOSPITAL_COMMUNITY)
Admission: EM | Admit: 2022-07-09 | Discharge: 2022-07-09 | Disposition: A | Discharge status: Home / Self Care | Payer: Medicare PPO | Attending: Emergency Medicine | Admitting: Emergency Medicine

## 2022-07-09 ENCOUNTER — Encounter (HOSPITAL_COMMUNITY): Payer: Self-pay

## 2022-07-09 ENCOUNTER — Other Ambulatory Visit: Payer: Self-pay

## 2022-07-09 ENCOUNTER — Emergency Department (HOSPITAL_COMMUNITY): Payer: Medicare PPO

## 2022-07-09 DIAGNOSIS — Z7901 Long term (current) use of anticoagulants: Secondary | ICD-10-CM

## 2022-07-09 DIAGNOSIS — I2510 Atherosclerotic heart disease of native coronary artery without angina pectoris: Secondary | ICD-10-CM

## 2022-07-09 DIAGNOSIS — I10 Essential (primary) hypertension: Secondary | ICD-10-CM | POA: Insufficient documentation

## 2022-07-09 DIAGNOSIS — I4891 Unspecified atrial fibrillation: Secondary | ICD-10-CM | POA: Diagnosis not present

## 2022-07-09 DIAGNOSIS — I480 Paroxysmal atrial fibrillation: Secondary | ICD-10-CM

## 2022-07-09 DIAGNOSIS — R079 Chest pain, unspecified: Secondary | ICD-10-CM | POA: Diagnosis not present

## 2022-07-09 DIAGNOSIS — I350 Nonrheumatic aortic (valve) stenosis: Secondary | ICD-10-CM | POA: Diagnosis not present

## 2022-07-09 DIAGNOSIS — Z952 Presence of prosthetic heart valve: Secondary | ICD-10-CM | POA: Diagnosis not present

## 2022-07-09 DIAGNOSIS — R0989 Other specified symptoms and signs involving the circulatory and respiratory systems: Secondary | ICD-10-CM | POA: Diagnosis not present

## 2022-07-09 LAB — CBC WITH DIFFERENTIAL/PLATELET
Abs Immature Granulocytes: 0.08 10*3/uL — ABNORMAL HIGH (ref 0.00–0.07)
Basophils Absolute: 0 10*3/uL (ref 0.0–0.1)
Basophils Relative: 0 %
Eosinophils Absolute: 0.1 10*3/uL (ref 0.0–0.5)
Eosinophils Relative: 1 %
HCT: 39.4 % (ref 36.0–46.0)
Hemoglobin: 13.4 g/dL (ref 12.0–15.0)
Immature Granulocytes: 1 %
Lymphocytes Relative: 11 %
Lymphs Abs: 0.7 10*3/uL (ref 0.7–4.0)
MCH: 35.2 pg — ABNORMAL HIGH (ref 26.0–34.0)
MCHC: 34 g/dL (ref 30.0–36.0)
MCV: 103.4 fL — ABNORMAL HIGH (ref 80.0–100.0)
Monocytes Absolute: 0.6 10*3/uL (ref 0.1–1.0)
Monocytes Relative: 9 %
Neutro Abs: 4.9 10*3/uL (ref 1.7–7.7)
Neutrophils Relative %: 78 %
Platelets: 152 10*3/uL (ref 150–400)
RBC: 3.81 MIL/uL — ABNORMAL LOW (ref 3.87–5.11)
RDW: 14.6 % (ref 11.5–15.5)
WBC: 6.3 10*3/uL (ref 4.0–10.5)
nRBC: 0 % (ref 0.0–0.2)

## 2022-07-09 LAB — BASIC METABOLIC PANEL
Anion gap: 10 (ref 5–15)
BUN: 33 mg/dL — ABNORMAL HIGH (ref 8–23)
CO2: 28 mmol/L (ref 22–32)
Calcium: 9.4 mg/dL (ref 8.9–10.3)
Chloride: 100 mmol/L (ref 98–111)
Creatinine, Ser: 1.27 mg/dL — ABNORMAL HIGH (ref 0.44–1.00)
GFR, Estimated: 40 mL/min — ABNORMAL LOW (ref >60)
Glucose, Bld: 106 mg/dL — ABNORMAL HIGH (ref 70–99)
Potassium: 3.9 mmol/L (ref 3.5–5.1)
Sodium: 138 mmol/L (ref 135–145)

## 2022-07-09 LAB — TROPONIN I (HIGH SENSITIVITY)
Troponin I (High Sensitivity): 19 ng/L — ABNORMAL HIGH (ref <18)
Troponin I (High Sensitivity): 20 ng/L — ABNORMAL HIGH (ref <18)

## 2022-07-09 LAB — MAGNESIUM: Magnesium: 1.8 mg/dL (ref 1.7–2.4)

## 2022-07-09 MED ORDER — METOPROLOL TARTRATE 25 MG PO TABS
12.5000 mg | ORAL_TABLET | ORAL | Status: AC
  Administered 2022-07-09: 12.5 mg via ORAL
  Filled 2022-07-09: qty 1

## 2022-07-09 MED ORDER — AMIODARONE HCL 200 MG PO TABS
100.0000 mg | ORAL_TABLET | ORAL | Status: AC
  Administered 2022-07-09: 100 mg via ORAL
  Filled 2022-07-09: qty 1

## 2022-07-09 NOTE — Consult Note (Addendum)
Cardiology Consultation   Patient ID: [name] [name] MRN: [id number]; DOB: [birth year]  Admit date: 07/09/2022 Date of Consult: 07/09/2022  PCP:  Creola Corn, MD   [facility] Providers Cardiologist:  Peter Swaziland, MD  Electrophysiologist:  Will Jorja Loa, MD  {  Patient Profile:   [name] [name] is a 89 y.o. female with a hx of paroxysmal atrial fibrillation, tachybradycardia syndrome status post PPM, CAD status post DES of LAD, hypertension, hyperlipidemia, HFpEF, breast CA, aortic stenosis status post TAVR who is being seen 07/09/2022 for the evaluation atrial fibrillation at the request of Dr. Rosalia Hammers.  History of Present Illness:   [name] [name] is a 89 year old female with past medical history noted above.  She has been followed by Dr. Alvia Grove as an outpatient.  Echocardiogram 09/2013 showed moderate aortic stenosis that had progressed since 2012.  In April 2017 she had an episode of atrial fibrillation that converted spontaneously and this was felt to be triggered by steroid injection.  She was noted to be back in atrial fibrillation June 2017 and was placed on flecainide but this was later stopped secondary to side effects.  See back in November with recurrent A-fib but was rate controlled.  Followed up in the A-fib clinic and was placed on amiodarone with subsequent conversion to sinus rhythm.   She was seen by Dr. Johney Frame in July 2018 and noted to have failure of her RV lead.  Her device was reprogrammed but developed pectoral stimulation.  She underwent explantation of her old generator and placement of a new left chest dual-chamber pacemaker August 2018.  Because of ongoing symptoms it was recommended she undergo cardiac catheterization.  This was done 11/2016 which demonstrated severe AS as well as severe stenosis in the mid LAD treated with PCI/DES x 1.  Underwent TAVR 01/2017 with postop course complicated by pericarditis and atrial fibrillation with RVR.  She was  continued on amiodarone and Eliquis.  Follow-up echo 01/2018 remained stable.  Went a left breast lumpectomy after a newly diagnosed breast mass 10/2020.  She was initially placed on letrozole this was discontinued secondary to side effects.  Last seen in the office on 04/2022 with Dr. Swaziland and reported doing well.  She was continued on Eliquis 2.5 mg twice daily and amiodarone 100 mg daily.  Presented to the ED on 5/6 with complaints of feeling she had gone into atrial fibrillation as well as vague chest discomfort. Reports she felt palpitations and a chest heaviness late last evening and this morning around 3am. Became concerned of having a stroke.   Labs notable for sodium 138, potassium 3.9, creatinine 1.27, magnesium 1.8, troponin 19>>20, WBC 6.3, hemoglobin 13.4.  EKG showed atrial fibrillation, 96 bpm.  Chest x-ray with no acute findings.   Cardiology asked to evaluate.  In talking with her, she has been complaint with her home meds including amiodarone, metoprolol and Eliquis. While in the ED, she was noted to have HRs in the 120s at time, but has since converted to SR.    Past Medical History:  Diagnosis Date   CKD (chronic kidney disease)    Complication of anesthesia    Coronary artery disease    a. 12/20/2016: s/p DES to LAD    Diverticulosis    Family history of adverse reaction to anesthesia    sister also has post-op n/v   Family history of breast cancer 09/01/2020   GERD (gastroesophageal reflux disease)    Hemorrhoids  Hyperlipidemia    Hypertension    Hypothyroidism    Osteoarthritis    Paroxysmal atrial fibrillation (HCC)    a. on OAC with Xarelto but switched to Eliquis after stenting.    Personal history of skin cancer 09/01/2020   PONV (postoperative nausea and vomiting)    Presence of permanent cardiac pacemaker    S/P TAVR (transcatheter aortic valve replacement) 01/29/2017   23 mm Edwards Sapien 3 transcatheter heart valve placed via percutaneous right  transfemoral approach     Past Surgical History:  Procedure Laterality Date   ABDOMINAL HYSTERECTOMY  1980   BASAL CELL CARCINOMA EXCISION     BREAST LUMPECTOMY WITH RADIOACTIVE SEED LOCALIZATION Left 10/04/2020   Procedure: LEFT BREAST LUMPECTOMY WITH RADIOACTIVE SEED LOCALIZATION;  Surgeon: Griselda Miner, MD;  Location: [facility] OR;  Service: General;  Laterality: Left;   CARDIAC CATHETERIZATION  ~ 11/2016   CATARACT EXTRACTION W/ INTRAOCULAR LENS  IMPLANT, BILATERAL Bilateral    CORONARY ANGIOPLASTY WITH STENT PLACEMENT  12/20/2016   "1 stent"   CORONARY STENT INTERVENTION N/A 12/20/2016   Procedure: CORONARY STENT INTERVENTION;  Surgeon: Swaziland, Peter M, MD;  Location: [facility] INVASIVE CV LAB;  Service: Cardiovascular;  Laterality: N/A;   FINGER SURGERY Left    "fell; developed skiers thumb; had to operate on it"   INSERT / REPLACE / REMOVE PACEMAKER  1993   for syptomatic bradycardia and syncope -- in [facility]   INTRAMEDULLARY (IM) NAIL INTERTROCHANTERIC Left 10/19/2018   Procedure: INTRAMEDULLARY (IM) NAIL INTERTROCHANTRIC;  Surgeon: Myrene Galas, MD;  Location: [facility] OR;  Service: Orthopedics;  Laterality: Left;   LEAD REVISION/REPAIR N/A 10/09/2016   New left subclavian MDT Adapta L PPM dual chamber system implanted by Dr Johney Frame with previously placed R subclavian system abandoned   PACEMAKER GENERATOR CHANGE  2001   pulse generator replacement by Dr. March Rummage GENERATOR CHANGE  04/01/2008   PPM Medtronic -- model # ADDRL1 serial # [id number] -- pulse generator replacement by Dr. Reyes Ivan    RIGHT/LEFT HEART CATH AND CORONARY ANGIOGRAPHY N/A 11/27/2016   Procedure: RIGHT/LEFT HEART CATH AND CORONARY ANGIOGRAPHY;  Surgeon: Swaziland, Peter M, MD;  [facility] INVASIVE CV LAB;  Service: Cardiovascular;  Laterality: N/A;   SQUAMOUS CELL CARCINOMA EXCISION     TEE WITHOUT CARDIOVERSION N/A 01/29/2017   Procedure: TRANSESOPHAGEAL ECHOCARDIOGRAM (TEE);  Surgeon: Tonny Bollman, MD;   Location: [facility] OR;  Service: Open Heart Surgery;  Laterality: N/A;   TONSILLECTOMY     TRANSCATHETER AORTIC VALVE REPLACEMENT, TRANSFEMORAL N/A 01/29/2017   Procedure: TRANSCATHETER AORTIC VALVE REPLACEMENT, TRANSFEMORAL;  Surgeon: Tonny Bollman, MD;  Location: [facility] OR;  Service: Open Heart Surgery;  Laterality: N/A;     Home Medications:  Prior to Admission medications   Medication Sig Start Date End Date Taking? Authorizing Provider  amiodarone (PACERONE) 200 MG tablet TAKE 1/2 TABLET(100 MG) BY MOUTH DAILY 03/19/22   Swaziland, Peter M, MD  amLODipine (NORVASC) 5 MG tablet TAKE 1 TABLET(5 MG) BY MOUTH DAILY 02/13/22   [place]il[place], Peter M, MD  apixaban (ELIQUIS) 2.5 MG TABS tablet Take 1 tablet (2.5 mg total) by mouth 2 (two) times daily. 07/24/21   [place]il[place], Peter M, MD  calcium carbonate (OS-CAL) 600 MG TABS Take 600 mg by mouth 2 (two) times daily with a meal.    [provider]  ezetimibe (ZETIA) 10 MG tablet Take 1 tablet (10 mg total) by mouth daily. TAKE 1 TABLET(10 MG) BY MOUTH DAILY Strength: 10  mg 03/06/22   [place]iland, Peter M, MD  feeding supplement, ENSURE ENLIVE, (ENSURE ENLIVE) LIQD Take 237 mLs by mouth 2 (two) times daily between meals. Patient taking differently: Take 237 mLs by mouth daily. 10/24/18   Montez Morita, PA-C  fluticasone [facility]) 50 MCG/ACT nasal spray Place 2 sprays into both nostrils daily as needed for allergies (congestion). 06/30/18   [provider]  furosemide (LASIX) 20 MG tablet Take one tablet (20mg ) every other day. 03/13/22   [place]il[place], Peter M, MD  losartan (COZAAR) 25 MG tablet TAKE 1 TABLET(25 MG) BY MOUTH DAILY 05/14/22   [place], Peter M, MD  metoprolol tartrate (LOPRESSOR) 25 MG tablet Take 1.5 tablets (37.5 mg total) by mouth 2 (two) times daily. 03/22/22   [place], Peter M, MD  Multiple Vitamins-Minerals (PRESERVISION AREDS 2) CAPS Take 1 capsule by mouth 2 (two) times daily.    [provider]  nitroGLYCERIN (NITROSTAT) 0.4 MG SL tablet For  chest pain, tightness, or pressure. While sitting, place 1 tablet under tongue. May be used every 5 minutes as needed, for up to 15 minutes. Do not use more than 3 tablets. 03/13/22   [place], Peter M, MD  pantoprazole (PROTONIX) 40 MG tablet Take 1 tablet (40 mg total) by mouth 2 (two) times daily before a meal. 09/21/21   [place], Peter M, MD  polyvinyl alcohol (LIQUIFILM TEARS) 1.4 % ophthalmic solution Place 1-2 drops into both eyes 3 (three) times daily as needed for dry eyes.    [provider]  SYNTHROID 125 MCG tablet Take 125 mcg by mouth daily before breakfast.  08/04/18   [provider]    Inpatient Medications: Scheduled Meds:  Continuous Infusions:  PRN Meds:   Allergies:    Allergies  Allergen Reactions   Penicillins Anaphylaxis and Other (See Comments)    Has patient had a PCN reaction causing immediate rash, facial/tongue/throat swelling, SOB or lightheadedness with hypotension: Yes Has patient had a PCN reaction causing severe rash involving mucus membranes or skin necrosis: No Has patient had a PCN reaction that required hospitalization: No Has patient had a PCN reaction occurring within the last 10 years: No If all of the above answers are "NO", then may proceed with Cephalosporin use.    Tetanus Toxoid Anaphylaxis   Demerol Nausea Only    Severe nausea   Clindamycin/Lincomycin     Sores in mouth   Letrozole Other (See Comments)   Meningococcal Poly Tetanus Conj Vaccine Acwy Other (See Comments)   Moxifloxacin Hcl Other (See Comments)   Codeine Nausea Only    Severe nausea    Social History:   Social History   Socioeconomic History   Marital status: Widowed    Spouse name: Not on file   Number of children: 0   Years of education: Not on file   Highest education level: Not on file  Occupational History    Employer: RETIRED  Tobacco Use   Smoking status: Former    Packs/day: 1.50    Years: 30.00    Additional pack years: 0.00    Total  pack years: 45.00    Types: Cigarettes    Quit date: 03/05/1978    Years since quitting: 44.3   Smokeless tobacco: Never  Vaping Use   Vaping Use: Never used  Substance and Sexual Activity   Alcohol use: No   Drug use: No   Sexual activity: Not on file  Other Topics Concern   Not on file  Social History  Narrative   Not on file   Social Determinants of Health   Financial Resource Strain: Low Risk  (01/16/2021)   Overall Financial Resource Strain (CARDIA)    Difficulty of Paying Living Expenses: Not hard at all  Food Insecurity: No Food Insecurity (01/16/2021)   Hunger Vital Sign    Worried About Running Out of Food in the Last Year: Never true    Ran Out of Food in the Last Year: Never true  Transportation Needs: No Transportation Needs (01/16/2021)   PRAPARE - Administrator, Civil Service (Medical): No    Lack of Transportation (Non-Medical): No  Physical Activity: Insufficiently Active (01/16/2021)   Exercise Vital Sign    Days of Exercise per Week: 2 days    Minutes of Exercise per Session: 30 min  Stress: No Stress Concern Present (01/16/2021)   [employer] of Occupational Health - Occupational Stress Questionnaire    Feeling of Stress : Not at all  Social Connections: Moderately Isolated (01/16/2021)   Social Connection and Isolation Panel [NHANES]    Frequency of Communication with Friends and Family: Three times a week    Frequency of Social Gatherings with Friends and Family: Twice a week    Attends Religious Services: 1 to 4 times per year    Active Member of [employer] or Organizations: No    Attends Banker Meetings: Never    Marital Status: Widowed  Intimate Partner Violence: Not At Risk (01/16/2021)   Humiliation, Afraid, Rape, and Kick questionnaire    Fear of Current or Ex-Partner: No    Emotionally Abused: No    Physically Abused: No    Sexually Abused: No    Family History:    Family History  Problem Relation Age of Onset    Alzheimer's disease Mother    Kidney cancer Father        dx 75s   Breast cancer Maternal Aunt 26   Breast cancer Cousin        maternal cousin, dx >50     ROS:  Please see the history of present illness.   All other ROS reviewed and negative.     Physical Exam/Data:   Vitals:   07/09/22 0930 07/09/22 1000 07/09/22 1030 07/09/22 1032  BP: 118/87 123/65 127/62   Pulse: 60 60 63   Resp: (!) 23 16 (!) 23   Temp:    (!) 97.5 F (36.4 C)  TempSrc:    Oral  SpO2: 94% 96% 98%   Weight:      Height:       No intake or output data in the 24 hours ending 07/09/22 1051    07/09/2022    5:40 AM 06/07/2022   11:48 AM 04/17/2022   11:46 AM  Last 3 Weights  Weight (lbs) 116 lb 116 lb 7 oz 123 lb 11.2 oz  Weight (kg) 52.617 kg 52.816 kg 56.11 kg     Body mass index is 19.3 kg/m.  General:  Well nourished, well developed, in no acute distress HEENT: normal Neck: no JVD Vascular: No carotid bruits; Distal pulses 2+ bilaterally Cardiac:  normal S1, S2; RRR; no murmur  Lungs:  clear to auscultation bilaterally, no wheezing, rhonchi or rales  Abd: soft, nontender, no hepatomegaly  Ext: no edema Musculoskeletal:  No deformities, BUE and BLE strength normal and equal Skin: warm and dry  Neuro:  CNs 2-12 intact, no focal abnormalities noted Psych:  Normal affect   EKG:  The  EKG was personally reviewed and demonstrates:  Atrial fibrillation, 96 bpm Telemetry:  Telemetry was personally reviewed and demonstrates:  Atrial fibrillation rates 90-120>>> NSR 60s  Relevant CV Studies:  Echo: 07/2020  IMPRESSIONS     1. Left ventricular ejection fraction, by estimation, is 60 to 65%. The  left ventricle has normal function. The left ventricle has no regional  wall motion abnormalities. Left ventricular diastolic parameters are  indeterminate.   2. Right ventricular systolic function is normal. The right ventricular  size is normal.   3. Mild mitral valve regurgitation.   4. Tricuspid  valve regurgitation is moderate.   5. S/p TAVR (01/29/17) 23 mm Edwards bioprosthesis. Peak and mean  gradients through the valve are 17 and 8 mm Hg repcectively AVA (VTI 1.53  cm2) No significant change from gradients in echo of 01/22/18 . Aortic  valve regurgitation is mild.   FINDINGS   Left Ventricle: Left ventricular ejection fraction, by estimation, is 60  to 65%. The left ventricle has normal function. The left ventricle has no  regional wall motion abnormalities. The left ventricular internal cavity  size was normal in size. There is   no left ventricular hypertrophy. Left ventricular diastolic parameters  are indeterminate.   Right Ventricle: The right ventricular size is normal. Right vetricular  wall thickness was not assessed. Right ventricular systolic function is  normal.   Left Atrium: Left atrial size was normal in size.   Right Atrium: Right atrial size was normal in size.   Pericardium: There is no evidence of pericardial effusion.   Mitral Valve: There is mild thickening of the mitral valve leaflet(s).  Mild mitral annular calcification. Mild mitral valve regurgitation.   Tricuspid Valve: The tricuspid valve is normal in structure. Tricuspid  valve regurgitation is moderate.   Aortic Valve: S/p TAVR (01/29/17) 23 mm Edwards bioprosthesis. Peak and  mean gradients through the valve are 17 and 8 mm Hg repcectively AVA (VTI  1.53 cm2) No significant change from gradients in echo of 01/22/18. Aortic  valve regurgitation is mild.  Aortic regurgitation PHT measures 412 msec. Aortic valve mean gradient  measures 8.0 mmHg. Aortic valve peak gradient measures 16.9 mmHg. Aortic  valve area, by VTI measures 1.49 cm.   Pulmonic Valve: The pulmonic valve was normal in structure. Pulmonic valve  regurgitation is mild.   Aorta: The aortic root is normal in size and structure.   IAS/Shunts: No atrial level shunt detected by color flow Doppler.   Additional Comments: A  device lead is visualized.       Laboratory Data:  High Sensitivity Troponin:   Recent Labs  Lab 07/09/22 0600 07/09/22 0810  TROPONINIHS 19* 20*     Chemistry Recent Labs  Lab 07/09/22 0600  NA 138  K 3.9  CL 100  CO2 28  GLUCOSE 106*  BUN 33*  CREATININE 1.27*  CALCIUM 9.4  MG 1.8  GFRNONAA 40*  ANIONGAP 10    No results for input(s): "PROT", "ALBUMIN", "AST", "ALT", "ALKPHOS", "BILITOT" in the last 168 hours. Lipids No results for input(s): "CHOL", "TRIG", "HDL", "LABVLDL", "LDLCALC", "CHOLHDL" in the last 168 hours.  Hematology Recent Labs  Lab 07/09/22 0600  WBC 6.3  RBC 3.81*  HGB 13.4  HCT 39.4  MCV 103.4*  MCH 35.2*  MCHC 34.0  RDW 14.6  PLT 152   Thyroid No results for input(s): "TSH", "FREET4" in the last 168 hours.  BNPNo results for input(s): "BNP", "PROBNP" in the last 168  hours.  DDimer No results for input(s): "DDIMER" in the last 168 hours.   Radiology/Studies:  DG Chest Port 1 View  Result Date: 07/09/2022 CLINICAL DATA:  89 year old female with chest pain on the left. EXAM: PORTABLE CHEST 1 VIEW COMPARISON:  Portable chest 12/16/2020 and earlier. FINDINGS: Chronic left chest dual lead cardiac pacemaker and disconnected right chest leads. Chronic TAVR. Lung volumes and mediastinal contours not significantly changed from 2022. Calcified aortic atherosclerosis. Visualized tracheal air column is within normal limits. Allowing for portable technique the lungs are clear. No pneumothorax or acute pleural effusion; trace chronic pleural effusions suspected. No acute osseous abnormality identified. IMPRESSION: Stable since 2022. No acute cardiopulmonary abnormality. Electronically Signed   By: Odessa Fleming M.D.   On: 07/09/2022 05:52     Assessment and Plan:   [name] [name] is a 89 y.o. female with a hx of paroxysmal atrial fibrillation, tachybradycardia syndrome status post PPM, CAD status post DES of LAD, hypertension, hyperlipidemia, HFpEF, breast CA,  aortic stenosis status post TAVR who is being seen 07/09/2022 for the evaluation atrial fibrillation at the request of Dr. Rosalia Hammers.  Paroxysmal Atrial fibrillation -- History of the same, awoke with palpitations and vague chest pressure.  On arrival to the ED she was noted to be in atrial fibrillation with's ranging from 90-120.  Since being in the ED she has converted back to sinus rhythm with heart rate in the 60s.  Suspect her chest pressure was secondary to episodes of RVR. -- Continue Eliquis 2.5 mg twice daily, amiodarone 100 mg daily as well as metoprolol.  Instructed she could take additional metoprolol for episodes at home and attempt to break rhythm. -- will arrange for outpatient follow-up in the office  Tachy-brady syndrome S/p PPM -- follows with EP, last device check stable  CAD s/p DES of LAD -- no ASA with need for Eliquis, on Zetia  AS s/p TAVR  HTN -- stable blood pressure -- on metoprolol and amlodipine  Risk Assessment/Risk Scores:   CHA2DS2-VASc Score = 6   This indicates a 9.7% annual risk of stroke. The patient's score is based upon: CHF History: 1 HTN History: 1 Diabetes History: 0 Stroke History: 0 Vascular Disease History: 1 Age Score: 2 Gender Score: 1    For questions or updates, please contact [facility] Please consult [email or web address] for contact info under    Signed, Laverda Page, NP  07/09/2022 10:51 AM  Patient seen and examined and agree with Laverda Page, NP as detailed above.  In brief, the patient is a 89 y.o. female with a hx of paroxysmal atrial fibrillation, tachybradycardia syndrome status post PPM, CAD status post DES of LAD, hypertension, hyperlipidemia, HFpEF, breast CA, aortic stenosis status post TAVR who presented to the ER with Afib for which Cardiology was consulted  Patient presented on this admission with vague chest discomfort and palpitations similar to those symptoms she has had with her Afib with the past. On  presentation ECG notable for Afib with HR 96. Trop flat 19>20. CXR without acute findings. Had been compliant with all home meds. Prior to our evaulation, the patient converted to NSR with resolution of symptoms.  Given patient is now back in rhythm with resolution of vague chest discomfort, okay to discharge home from a CV perspective on current home medications. Will arrange for outpatient follow-up.  GEN: Elderly female, NAD   Neck: No JVD Cardiac: RRR, 1/6 systolic murmur Respiratory: Clear to auscultation bilaterally. GI: Soft, nontender, non-distended  MS: RLE wrapped Neuro:  Nonfocal  Psych: Normal affect    Plan: -Back in NSR with resolution of symptoms -Okay to discharge home from a CV perspective -Continue home amiodarone 100mg  daily -Continue home apixaban 2.5mg  daily -Continue home metop 37.5mg  BID -Will arrange for outpatient follow-up  Laurance Flatten, MD

## 2022-07-09 NOTE — ED Provider Notes (Signed)
  Physical Exam  BP (!) 144/82 (BP Location: Right Arm)   Pulse (!) 110   Resp 18   Ht 1.651 m (5\' 5" )   Wt 52.6 kg   SpO2 98%   BMI 19.30 kg/m   Physical Exam  Procedures  Procedures  ED Course / MDM    Medical Decision Making Amount and/or Complexity of Data Reviewed Labs: ordered. Radiology: ordered.  Risk Prescription drug management.   89 yo female ho paroxysmal a fib presented with palpitations thought to be afib.  Patient states it felt funny and like her a fib. Patient on eliquis.and metoprolol, and amiodarone.   Patient withpacemaker.  Previous 2 ekgs with atrial paced rhythm. Last a fib 28 Novembe 2018 Dr. Preston Fleeting discussed with cardiology fellow Dr. Orson Aloe. Requested labs and to call day provider. Labs CBC wnl Awaiting bmet, trop magnesium Usual am dose of amiodarone and lopressor given. Will hold eliquis until speaking with cardiology. 8:36 AM Discussed with Callie, on-call for cardiology and they will see in consultation  11:02 AM Patient converted to normal sinus rhythm while in the ED Cardiology has consulted and there is for discharge and to continue home medications.     Margarita Grizzle, MD 07/09/22 [phone]

## 2022-07-09 NOTE — ED Notes (Signed)
Put patient on the bedpan patient is now off put a brief on patient is resting with family at bedside and call bell in reach

## 2022-07-09 NOTE — ED Provider Notes (Signed)
[place] [facility]ER[facility] DEPARTMENT AT [facility] Provider Note   CSN: [id number] Arrival date & time: 07/09/22  0451     History  Chief Complaint  Patient presents with   Chest Pain    [name] [name] is a 89 y.o. female.  The history is provided by the patient.  Chest Pain She has history of hypertension, hyperlipidemia, coronary artery disease, TAVR, paroxysmal atrial fibrillation anticoagulated on apixaban and comes in because she feels that she is in atrial fibrillation.  She states that she notices when her rhythm changes.  She is complaining of some vague chest discomfort but denies actual chest pain or heaviness or tightness or pressure.  She denies dyspnea, nausea, diaphoresis.  She states she has been compliant with her medications but there has been some adjustment in some of her medications.   Home Medications Prior to Admission medications   Medication Sig Start Date End Date Taking? Authorizing Provider  amiodarone (PACERONE) 200 MG tablet TAKE 1/2 TABLET(100 MG) BY MOUTH DAILY 03/19/22   [place], Peter M, MD  amLODipine (NORVASC) 5 MG tablet TAKE 1 TABLET(5 MG) BY MOUTH DAILY 02/13/22   [place], Peter M, MD  apixaban (ELIQUIS) 2.5 MG TABS tablet Take 1 tablet (2.5 mg total) by mouth 2 (two) times daily. 07/24/21   [place], Peter M, MD  calcium carbonate (OS-CAL) 600 MG TABS Take 600 mg by mouth 2 (two) times daily with a meal.    [provider]  ezetimibe (ZETIA) 10 MG tablet Take 1 tablet (10 mg total) by mouth daily. TAKE 1 TABLET(10 MG) BY MOUTH DAILY Strength: 10 mg 03/06/22   [place], Peter M, MD  feeding supplement, ENSURE ENLIVE, (ENSURE ENLIVE) LIQD Take 237 mLs by mouth 2 (two) times daily between meals. Patient taking differently: Take 237 mLs by mouth daily. 10/24/18   Montez Morita, PA-C  fluticasone [facility]) 50 MCG/ACT nasal spray Place 2 sprays into both nostrils daily as needed for allergies (congestion). 06/30/18   [provider]   furosemide (LASIX) 20 MG tablet Take one tablet (20mg ) every other day. 03/13/22   [place], Peter M, MD  losartan (COZAAR) 25 MG tablet TAKE 1 TABLET(25 MG) BY MOUTH DAILY 05/14/22   [place], Peter M, MD  metoprolol tartrate (LOPRESSOR) 25 MG tablet Take 1.5 tablets (37.5 mg total) by mouth 2 (two) times daily. 03/22/22   [place], Peter M, MD  Multiple Vitamins-Minerals (PRESERVISION AREDS 2) CAPS Take 1 capsule by mouth 2 (two) times daily.    [provider]  nitroGLYCERIN (NITROSTAT) 0.4 MG SL tablet For chest pain, tightness, or pressure. While sitting, place 1 tablet under tongue. May be used every 5 minutes as needed, for up to 15 minutes. Do not use more than 3 tablets. 03/13/22   [place], Peter M, MD  pantoprazole (PROTONIX) 40 MG tablet Take 1 tablet (40 mg total) by mouth 2 (two) times daily before a meal. 09/21/21   [place], Peter M, MD  polyvinyl alcohol (LIQUIFILM TEARS) 1.4 % ophthalmic solution Place 1-2 drops into both eyes 3 (three) times daily as needed for dry eyes.    [provider]  SYNTHROID 125 MCG tablet Take 125 mcg by mouth daily before breakfast.  08/04/18   [provider]      Allergies    Penicillins, Tetanus toxoid, Demerol, Clindamycin/lincomycin, Letrozole, Meningococcal poly tetanus conj vaccine acwy, Moxifloxacin hcl, and Codeine    Review of Systems   Review of Systems  Cardiovascular:  Positive for  chest pain.  All other systems reviewed and are negative.   Physical Exam Updated Vital Signs BP (!) 144/82 (BP Location: Right Arm)   Pulse (!) 110   SpO2 98%  Physical Exam Vitals reviewed.   89 year old female, resting comfortably and in no acute distress. Vital signs are significant for borderline elevated blood pressure and slightly elevated heart rate. Oxygen saturation is 98%, which is normal. Head is normocephalic and atraumatic. PERRLA, EOMI. Oropharynx is clear. Neck is nontender and supple without adenopathy or JVD. Back is  nontender and there is no CVA tenderness. Lungs are clear without rales, wheezes, or rhonchi. Chest is nontender. Heart has an irregular rhythm without murmur. Abdomen is soft, flat, nontender. Extremities: Wound is present on the right lower leg with dressing in place which is not removed.  There is no edema.  Moderate venous stasis changes are noted. Skin is warm and dry without rash. Neurologic: Mental status is normal, cranial nerves are intact, moves all extremities equally.  ED Results / Procedures / Treatments   Labs (all labs ordered are listed, but only abnormal results are displayed) Labs Reviewed  CBC WITH DIFFERENTIAL/PLATELET - Abnormal; Notable for the following components:      Result Value   RBC 3.81 (*)    MCV 103.4 (*)    MCH 35.2 (*)    Abs Immature Granulocytes 0.08 (*)    All other components within normal limits  BASIC METABOLIC PANEL  MAGNESIUM  TROPONIN I (HIGH SENSITIVITY)  TROPONIN I (HIGH SENSITIVITY)    EKG EKG Interpretation  Date/Time:  Monday Jul 09 2022 04:54:06 EDT Ventricular Rate:  96 PR Interval:    QRS Duration: 106 QT Interval:  362 QTC Calculation: 458 R Axis:   54 Text Interpretation: Atrial fibrillation Minimal ST elevation, lateral leads When compared with ECG of 12/16/2020, Atrial fibrillation has replaced ATRIAL PACED RHYTHM Confirmed by Dione Booze ([id number]) on 07/09/2022 5:02:14 AM  Radiology DG Chest Port 1 View  Result Date: 07/09/2022 CLINICAL DATA:  89 year old female with chest pain on the left. EXAM: PORTABLE CHEST 1 VIEW COMPARISON:  Portable chest 12/16/2020 and earlier. FINDINGS: Chronic left chest dual lead cardiac pacemaker and disconnected right chest leads. Chronic TAVR. Lung volumes and mediastinal contours not significantly changed from 2022. Calcified aortic atherosclerosis. Visualized tracheal air column is within normal limits. Allowing for portable technique the lungs are clear. No pneumothorax or acute pleural  effusion; trace chronic pleural effusions suspected. No acute osseous abnormality identified. IMPRESSION: Stable since 2022. No acute cardiopulmonary abnormality. Electronically Signed   By: Odessa Fleming M.D.   On: 07/09/2022 05:52    Procedures Procedures  Cardiac monitor shows atrial fibrillation with a mostly controlled ventricular response, per my interpretation  Medications Ordered in ED Medications - No data to display  ED Course/ Medical Decision Making/ A&P                             Medical Decision Making Amount and/or Complexity of Data Reviewed Labs: ordered. Radiology: ordered.  Risk Prescription drug management.   Paroxysmal atrial fibrillation with vague chest discomfort.  I have low index of suspicion for ACS.  I have reviewed and interpreted her electrocardiogram, my interpretation is atrial fibrillation which is new compared with 12/16/2020 but no acute ST or T changes.  I have ordered laboratory workup of CBC, basic metabolic panel, magnesium level, troponin x 2, chest x-ray.  I reviewed  her past records, and she had a cardiology office visit on 04/13/2022 at which time she was noted to be in sinus rhythm and it was felt that amiodarone was successfully controlling her rhythm.  I have ordered workup of CBC, basic metabolic panel, magnesium level and portable chest x-ray.  Chest x-ray shows stable chest, no acute findings.  I have independently viewed the image, and agree with radiologist interpretation.  I have reviewed the laboratory tests which have come back at this point and my interpretation is microcytosis without anemia, unchanged from prior.  Basic metabolic panel and troponin and magnesium are all pending.  Once they are back, she will need consultation with cardiology regarding her atrial fibrillation.  Case is signed out to Dr. Rosalia Hammers.  Final Clinical Impression(s) / ED Diagnoses Final diagnoses:  Paroxysmal atrial fibrillation (HCC)  Chronic anticoagulation    Rx  / DC Orders ED Discharge Orders     None         Dione Booze, MD 07/09/22 [phone]

## 2022-07-09 NOTE — ED Triage Notes (Signed)
Pt presents from home, started feeling eerie w/ chest discomfort. Pt has a pacemaker, per EMS has been in and out of paced beats and A-fib. PMH of A-fib. A/O x4, w/ even and unlabored breathing.

## 2022-07-09 NOTE — ED Notes (Signed)
Provider at bedside, will update temp when conversation is over.

## 2022-07-09 NOTE — Discharge Instructions (Addendum)
Please continue your home medications You were in an irregular heartbeat but has since converted back to a normal rhythm Cardiac G should be contacting you for outpatient follow-up Do not hesitate to return if you are having new or worsening symptoms

## 2022-07-10 ENCOUNTER — Encounter (HOSPITAL_BASED_OUTPATIENT_CLINIC_OR_DEPARTMENT_OTHER): Payer: Medicare PPO | Attending: Internal Medicine | Admitting: Internal Medicine

## 2022-07-10 DIAGNOSIS — S81811A Laceration without foreign body, right lower leg, initial encounter: Secondary | ICD-10-CM | POA: Diagnosis not present

## 2022-07-10 DIAGNOSIS — I480 Paroxysmal atrial fibrillation: Secondary | ICD-10-CM | POA: Insufficient documentation

## 2022-07-10 DIAGNOSIS — I872 Venous insufficiency (chronic) (peripheral): Secondary | ICD-10-CM | POA: Insufficient documentation

## 2022-07-10 DIAGNOSIS — Z08 Encounter for follow-up examination after completed treatment for malignant neoplasm: Secondary | ICD-10-CM | POA: Insufficient documentation

## 2022-07-10 DIAGNOSIS — Z853 Personal history of malignant neoplasm of breast: Secondary | ICD-10-CM | POA: Insufficient documentation

## 2022-07-10 DIAGNOSIS — Z952 Presence of prosthetic heart valve: Secondary | ICD-10-CM | POA: Insufficient documentation

## 2022-07-10 DIAGNOSIS — I87311 Chronic venous hypertension (idiopathic) with ulcer of right lower extremity: Secondary | ICD-10-CM | POA: Insufficient documentation

## 2022-07-10 DIAGNOSIS — Z7901 Long term (current) use of anticoagulants: Secondary | ICD-10-CM | POA: Insufficient documentation

## 2022-07-10 DIAGNOSIS — L97812 Non-pressure chronic ulcer of other part of right lower leg with fat layer exposed: Secondary | ICD-10-CM | POA: Diagnosis not present

## 2022-07-10 DIAGNOSIS — I350 Nonrheumatic aortic (valve) stenosis: Secondary | ICD-10-CM | POA: Insufficient documentation

## 2022-07-10 DIAGNOSIS — S51811D Laceration without foreign body of right forearm, subsequent encounter: Secondary | ICD-10-CM | POA: Diagnosis not present

## 2022-07-11 ENCOUNTER — Ambulatory Visit: Payer: Medicare PPO | Admitting: Hematology and Oncology

## 2022-07-12 ENCOUNTER — Other Ambulatory Visit: Payer: Self-pay | Admitting: Cardiology

## 2022-07-12 ENCOUNTER — Other Ambulatory Visit: Payer: Self-pay | Admitting: *Deleted

## 2022-07-12 ENCOUNTER — Other Ambulatory Visit (HOSPITAL_COMMUNITY): Payer: Self-pay

## 2022-07-12 ENCOUNTER — Telehealth: Payer: Self-pay

## 2022-07-12 ENCOUNTER — Telehealth: Payer: Self-pay | Admitting: *Deleted

## 2022-07-12 DIAGNOSIS — I4819 Other persistent atrial fibrillation: Secondary | ICD-10-CM

## 2022-07-12 MED ORDER — APIXABAN 2.5 MG PO TABS: ORAL_TABLET | ORAL | 1 refills | Status: AC

## 2022-07-12 NOTE — Telephone Encounter (Signed)
Prescription refill request for Eliquis received. Indication: AF Last office visit: 04/16/22   P Jprdan MD Scr: 1.27 ON 07/09/22  Epic Age: 89 Weight: 55.4KG  Based on above findings Eliquis 2.5mg  twice daily is the appropriate dose.  Refill approved.

## 2022-07-12 NOTE — Telephone Encounter (Signed)
Pharmacy Patient Advocate Encounter  Prior Authorization for ELIQUIS 2.5 MG has been approved.    Effective dates: 03/05/22 through 03/05/23  Haze Rushing, CPhT Pharmacy Patient Advocate Specialist Direct Number: [phone] Fax: [phone]

## 2022-07-12 NOTE — Addendum Note (Signed)
Addended by: Louanna Raw on: 07/12/2022 02:12 PM   Modules accepted: Orders

## 2022-07-12 NOTE — Telephone Encounter (Signed)
PA initiated, please see separate encounter for updates on determination. (I will route you back in once a decision has been made)  Kinley Ferrentino, CPhT Pharmacy Patient Advocate Specialist Direct Number: [phone] Fax: [phone]  

## 2022-07-12 NOTE — Telephone Encounter (Signed)
Received a request from [address] stating the pt needs a Prior Authorization for Eliquis 2.5mg  tablets. The form states to call [phone] to initiate prior authorization and that the pt ID is [id number]. Will send a message to Prior Auth Team for assistance with this.

## 2022-07-12 NOTE — Telephone Encounter (Signed)
Pharmacy Patient Advocate Encounter   Received notification from [facility] that prior authorization for ELIQUIS 2.5 MG is needed.    PA submitted on 07/12/22 Key [id number] Status is pending  Haze Rushing, CPhT Pharmacy Patient Advocate Specialist Direct Number: [phone] Fax: [phone]

## 2022-07-12 NOTE — Telephone Encounter (Signed)
Eliquis 2.5mg  refill request received. Patient is 89 years old, weight-52.6kg, Crea-1.27 on 07/09/22, Diagnosis-Afib, and last seen by Dr. [place] on 04/16/22. Dose is appropriate based on dosing criteria. Will send in refill to requested pharmacy.

## 2022-07-13 ENCOUNTER — Encounter: Payer: Self-pay | Admitting: Cardiology

## 2022-07-13 NOTE — Telephone Encounter (Signed)
Error

## 2022-07-16 ENCOUNTER — Telehealth: Payer: Self-pay

## 2022-07-16 ENCOUNTER — Encounter (HOSPITAL_BASED_OUTPATIENT_CLINIC_OR_DEPARTMENT_OTHER): Payer: Medicare PPO | Admitting: Internal Medicine

## 2022-07-16 DIAGNOSIS — Z952 Presence of prosthetic heart valve: Secondary | ICD-10-CM | POA: Diagnosis not present

## 2022-07-16 DIAGNOSIS — Z7901 Long term (current) use of anticoagulants: Secondary | ICD-10-CM

## 2022-07-16 DIAGNOSIS — L97812 Non-pressure chronic ulcer of other part of right lower leg with fat layer exposed: Secondary | ICD-10-CM

## 2022-07-16 DIAGNOSIS — I872 Venous insufficiency (chronic) (peripheral): Secondary | ICD-10-CM | POA: Diagnosis not present

## 2022-07-16 DIAGNOSIS — I87311 Chronic venous hypertension (idiopathic) with ulcer of right lower extremity: Secondary | ICD-10-CM | POA: Diagnosis not present

## 2022-07-16 DIAGNOSIS — Z08 Encounter for follow-up examination after completed treatment for malignant neoplasm: Secondary | ICD-10-CM | POA: Diagnosis not present

## 2022-07-16 DIAGNOSIS — I480 Paroxysmal atrial fibrillation: Secondary | ICD-10-CM | POA: Diagnosis not present

## 2022-07-16 DIAGNOSIS — T798XXA Other early complications of trauma, initial encounter: Secondary | ICD-10-CM

## 2022-07-16 DIAGNOSIS — Z853 Personal history of malignant neoplasm of breast: Secondary | ICD-10-CM | POA: Diagnosis not present

## 2022-07-16 DIAGNOSIS — I350 Nonrheumatic aortic (valve) stenosis: Secondary | ICD-10-CM | POA: Diagnosis not present

## 2022-07-16 NOTE — Telephone Encounter (Signed)
Transition Care Management Follow-up Telephone Call Date of discharge and from where: [facility] 5/6 How have you been since you were released from the hospital? Better  Any questions or concerns? No  Items Reviewed: Did the pt receive and understand the discharge instructions provided? Yes  Medications obtained and verified? Yes  Other? No  Any new allergies since your discharge? No  Dietary orders reviewed? No Do you have support at home? Yes     Follow up appointments reviewed:  PCP Hospital f/u appt confirmed? Yes  Scheduled to see pcp on  @ over the phone . Specialist Hospital f/u appt confirmed? Yes  Scheduled to see Cardiology on 5/17 @ . Are transportation arrangements needed? No  If their condition worsens, is the pt aware to call PCP or go to the Emergency Dept.? Yes Was the patient provided with contact information for the PCP's office or ED? Yes Was to pt encouraged to call back with questions or concerns? Yes

## 2022-07-18 ENCOUNTER — Ambulatory Visit (HOSPITAL_COMMUNITY): Payer: Medicare PPO | Admitting: Internal Medicine

## 2022-07-18 DIAGNOSIS — R7989 Other specified abnormal findings of blood chemistry: Secondary | ICD-10-CM | POA: Diagnosis not present

## 2022-07-18 DIAGNOSIS — E785 Hyperlipidemia, unspecified: Secondary | ICD-10-CM | POA: Diagnosis not present

## 2022-07-19 ENCOUNTER — Encounter (HOSPITAL_BASED_OUTPATIENT_CLINIC_OR_DEPARTMENT_OTHER): Payer: Medicare PPO | Admitting: Internal Medicine

## 2022-07-20 ENCOUNTER — Ambulatory Visit: Payer: Medicare PPO | Attending: Nurse Practitioner | Admitting: Nurse Practitioner

## 2022-07-20 VITALS — BP 128/74 | HR 76 | Ht 65.0 in | Wt 115.8 lb

## 2022-07-20 DIAGNOSIS — E785 Hyperlipidemia, unspecified: Secondary | ICD-10-CM | POA: Diagnosis not present

## 2022-07-20 DIAGNOSIS — Z950 Presence of cardiac pacemaker: Secondary | ICD-10-CM

## 2022-07-20 DIAGNOSIS — I480 Paroxysmal atrial fibrillation: Secondary | ICD-10-CM | POA: Diagnosis not present

## 2022-07-20 DIAGNOSIS — I2510 Atherosclerotic heart disease of native coronary artery without angina pectoris: Secondary | ICD-10-CM

## 2022-07-20 DIAGNOSIS — I495 Sick sinus syndrome: Secondary | ICD-10-CM

## 2022-07-20 DIAGNOSIS — I10 Essential (primary) hypertension: Secondary | ICD-10-CM | POA: Diagnosis not present

## 2022-07-20 DIAGNOSIS — I350 Nonrheumatic aortic (valve) stenosis: Secondary | ICD-10-CM

## 2022-07-20 DIAGNOSIS — I5032 Chronic diastolic (congestive) heart failure: Secondary | ICD-10-CM

## 2022-07-20 NOTE — Patient Instructions (Addendum)
Medication Instructions:  Your physician recommends that you continue on your current medications as directed. Please refer to the Current Medication list given to you today.  *If you need a refill on your cardiac medications before your next appointment, please call your pharmacy*   Lab Work: NONE ordered at this time of appointment   If you have labs (blood work) drawn today and your tests are completely normal, you will receive your results only by: MyChart Message (if you have MyChart) OR A paper copy in the mail If you have any lab test that is abnormal or we need to change your treatment, we will call you to review the results.   Testing/Procedures: Your physician has requested that you have an echocardiogram. Echocardiography is a painless test that uses sound waves to create images of your heart. It provides your doctor with information about the size and shape of your heart and how well your heart's chambers and valves are working. This procedure takes approximately one hour. There are no restrictions for this procedure. Please do NOT wear cologne, perfume, aftershave, or lotions (deodorant is allowed). Please arrive 15 minutes prior to your appointment time.     Follow-Up: At [facility], you and your health needs are our priority.  As part of our continuing mission to provide you with exceptional heart care, we have created designated Provider Care Teams.  These Care Teams include your primary Cardiologist (physician) and Advanced Practice Providers (APPs -  Physician Assistants and Nurse Practitioners) who all work together to provide you with the care you need, when you need it.  We recommend signing up for the patient portal called "MyChart".  Sign up information is provided on this After Visit Summary.  MyChart is used to connect with patients for Virtual Visits (Telemedicine).  Patients are able to view lab/test results, encounter notes, upcoming appointments, etc.   Non-urgent messages can be sent to your provider as well.   To learn more about what you can do with MyChart, go to [email or web address].    Your next appointment:    Keep follow up appointments  Provider:   Peter [place], MD     Other Instructions

## 2022-07-20 NOTE — Progress Notes (Unsigned)
Office Visit    Patient Name: [name] [name] Date of Encounter: 07/20/2022  Primary Care Provider:  Creola Corn, MD Primary Cardiologist:  Peter Swaziland, MD  Chief Complaint    89 year old female with a history of CAD s/p DES-LAD in 2018, paroxysmal atrial fibrillation, tachybrady syndrome s/p PPM, chronic diastolic heart failure, aortic stenosis s/p TAVR, hypertension, hyperlipidemia, and breast cancer who presents for ED follow-up related to atrial fibrillation.  Past Medical History    Past Medical History:  Diagnosis Date   CKD (chronic kidney disease)    Complication of anesthesia    Coronary artery disease    a. 12/20/2016: s/p DES to LAD    Diverticulosis    Family history of adverse reaction to anesthesia    sister also has post-op n/v   Family history of breast cancer 09/01/2020   GERD (gastroesophageal reflux disease)    Hemorrhoids    Hyperlipidemia    Hypertension    Hypothyroidism    Osteoarthritis    Paroxysmal atrial fibrillation (HCC)    a. on OAC with Xarelto but switched to Eliquis after stenting.    Personal history of skin cancer 09/01/2020   PONV (postoperative nausea and vomiting)    Presence of permanent cardiac pacemaker    S/P TAVR (transcatheter aortic valve replacement) 01/29/2017   23 mm Edwards Sapien 3 transcatheter heart valve placed via percutaneous right transfemoral approach    Past Surgical History:  Procedure Laterality Date   ABDOMINAL HYSTERECTOMY  1980   BASAL CELL CARCINOMA EXCISION     BREAST LUMPECTOMY WITH RADIOACTIVE SEED LOCALIZATION Left 10/04/2020   Procedure: LEFT BREAST LUMPECTOMY WITH RADIOACTIVE SEED LOCALIZATION;  Surgeon: Griselda Miner, MD;  Location: [facility] OR;  Service: General;  Laterality: Left;   CARDIAC CATHETERIZATION  ~ 11/2016   CATARACT EXTRACTION W/ INTRAOCULAR LENS  IMPLANT, BILATERAL Bilateral    CORONARY ANGIOPLASTY WITH STENT PLACEMENT  12/20/2016   "1 stent"   CORONARY STENT INTERVENTION N/A 12/20/2016    Procedure: CORONARY STENT INTERVENTION;  Surgeon: Swaziland, Peter M, MD;  Location: [facility] INVASIVE CV LAB;  Service: Cardiovascular;  Laterality: N/A;   FINGER SURGERY Left    "fell; developed skiers thumb; had to operate on it"   INSERT / REPLACE / REMOVE PACEMAKER  1993   for syptomatic bradycardia and syncope -- in [facility]   INTRAMEDULLARY (IM) NAIL INTERTROCHANTERIC Left 10/19/2018   Procedure: INTRAMEDULLARY (IM) NAIL INTERTROCHANTRIC;  Surgeon: Myrene Galas, MD;  Location: [facility] OR;  Service: Orthopedics;  Laterality: Left;   LEAD REVISION/REPAIR N/A 10/09/2016   New left subclavian MDT Adapta L PPM dual chamber system implanted by Dr Johney Frame with previously placed R subclavian system abandoned   PACEMAKER GENERATOR CHANGE  2001   pulse generator replacement by Dr. March Rummage GENERATOR CHANGE  04/01/2008   PPM Medtronic -- model # ADDRL1 serial # [id number] -- pulse generator replacement by Dr. Reyes Ivan    RIGHT/LEFT HEART CATH AND CORONARY ANGIOGRAPHY N/A 11/27/2016   Procedure: RIGHT/LEFT HEART CATH AND CORONARY ANGIOGRAPHY;  Surgeon: Swaziland, Peter M, MD;  Location: [facility] INVASIVE CV LAB;  Service: Cardiovascular;  Laterality: N/A;   SQUAMOUS CELL CARCINOMA EXCISION     TEE WITHOUT CARDIOVERSION N/A 01/29/2017   Procedure: TRANSESOPHAGEAL ECHOCARDIOGRAM (TEE);  Surgeon: Tonny Bollman, MD;  Location: [facility] OR;  Service: Open Heart Surgery;  Laterality: N/A;   TONSILLECTOMY     TRANSCATHETER AORTIC VALVE REPLACEMENT, TRANSFEMORAL N/A 01/29/2017   Procedure: TRANSCATHETER AORTIC VALVE  REPLACEMENT, TRANSFEMORAL;  Surgeon: Tonny Bollman, MD;  Location: [facility] OR;  Service: Open Heart Surgery;  Laterality: N/A;    Allergies  Allergies  Allergen Reactions   Penicillins Anaphylaxis and Other (See Comments)    Has patient had a PCN reaction causing immediate rash, facial/tongue/throat swelling, SOB or lightheadedness with hypotension: Yes Has patient had a PCN reaction causing severe rash  involving mucus membranes or skin necrosis: No Has patient had a PCN reaction that required hospitalization: No Has patient had a PCN reaction occurring within the last 10 years: No If all of the above answers are "NO", then may proceed with Cephalosporin use.    Tetanus Toxoid Anaphylaxis   Demerol Nausea Only    Severe nausea   Clindamycin/Lincomycin     Sores in mouth   Letrozole Other (See Comments)   Meningococcal Poly Tetanus Conj Vaccine Acwy Other (See Comments)   Moxifloxacin Hcl Other (See Comments)   Codeine Nausea Only    Severe nausea     Labs/Other Studies Reviewed    The following studies were reviewed today: Echo 07/2020: IMPRESSIONS    1. Left ventricular ejection fraction, by estimation, is 60 to 65%. The  left ventricle has normal function. The left ventricle has no regional  wall motion abnormalities. Left ventricular diastolic parameters are  indeterminate.   2. Right ventricular systolic function is normal. The right ventricular  size is normal.   3. Mild mitral valve regurgitation.   4. Tricuspid valve regurgitation is moderate.   5. S/p TAVR (01/29/17) 23 mm Edwards bioprosthesis. Peak and mean  gradients through the valve are 17 and 8 mm Hg repcectively AVA (VTI 1.53  cm2) No significant change from gradients in echo of 01/22/18 . Aortic  valve regurgitation is mild.    Recent Labs: 09/19/2021: ALT 15; TSH 3.890 07/09/2022: BUN 33; Creatinine, Ser 1.27; Hemoglobin 13.4; Magnesium 1.8; Platelets 152; Potassium 3.9; Sodium 138  Recent Lipid Panel    Component Value Date/Time   CHOL 219 (H) 02/05/2018 1440   TRIG 70 02/05/2018 1440   HDL 103 02/05/2018 1440   CHOLHDL 2.1 02/05/2018 1440   LDLCALC 102 (H) 02/05/2018 1440    History of Present Illness    89 year old female with the above past medical history including CAD s/p DES-LAD in 2018, paroxysmal atrial fibrillation, tachybrady syndrome s/p PPM, chronic diastolic heart failure, aortic  stenosis s/p TAVR, hypertension, hyperlipidemia, and breast cancer.  She has a history of tachycardia-bradycardia syndrome s/p PPM in 1993.  She was diagnosed with atrial fibrillation in 2017, thought to be triggered by steroid injection.  She was later started on flecainide, however, this was stopped due to side effects.  She was seen in the A-fib clinic recurrent atrial fibrillation, and was started on amiodarone with subsequent conversion to sinus rhythm.  She underwent explantation of her old generator and placement of new left chest dual-chamber pacemaker in August 2018.  She underwent cardiac catheterization in 11/2016 which demonstrated severe AS, severe mid LAD stenosis s/p DES x 1.  She underwent successful TAVR in 01/2017.  Postop course was complicated by pericarditis and atrial fibrillation with RVR.  She was continued on amiodarone and Eliquis.  Follow-up echo in 2019 was stable.  Most recent echocardiogram in 07/2020 showed EF 60 to 65%, normal LV function, no RWMA, normal RV systolic function, mild mitral valve regurgitation, moderate tricuspid valve regurgitation, stable TAVR bioprosthesis, mean gradient 8 mmHg, no significant change from prior echo.  She underwent a left  breast lumpectomy for breast cancer in 10/2020.  She was last seen in the office in 04/2022 was doing well from a cardiac standpoint.  She presented to the ED on 07/09/2022 with complaints of vague chest discomfort.  She was noted to be in atrial fibrillation.  Cardiology was consulted.  It was felt that her chest discomfort was likely related to episodes of atrial fibrillation with RVR.  She spontaneously converted to sinus rhythm.  She was advised to take an additional metoprolol as needed for breakthrough atrial fibrillation.  She was discharged home in stable condition.  She presents today for follow-up accompanied by her sister.  Since her last visit and since her recent ED visit she has stable from a cardiac standpoint.  She  denies any recurrent episodes of atrial fibrillation.  She does have nonpitting bilateral lower extremity edema, overall stable.  She denies chest pain, dyspnea, PND, orthopnea, weight gain. Will repeat echocardiogram for ongoing monitoring of.  BP is well-controlled.    She just had a chest x-ray in the ED that was stable.  Labs including TSH, magnesium, CBC, and Bement were unremarkable.  Discussed ED precautions.  I was advised her she can take an additional metoprolol as needed for breakthrough atrial fibrillation. She will keep follow-up EP, [place] as scheduled. Paroxysmal atrial fibrillation: CAD: Tachy-brady syndrome: Chronic diastolic heart failure:  Aortic stenosis: Hypertension: Hyperlipidemia: Disposition:  Home Medications    Current Outpatient Medications  Medication Sig Dispense Refill   amiodarone (PACERONE) 200 MG tablet TAKE 1/2 TABLET(100 MG) BY MOUTH DAILY 45 tablet 3   amLODipine (NORVASC) 5 MG tablet TAKE 1 TABLET(5 MG) BY MOUTH DAILY 90 tablet 3   apixaban (ELIQUIS) 2.5 MG TABS tablet TAKE 1 TABLET(2.5 MG) BY MOUTH TWICE DAILY 180 tablet 1   calcium carbonate (OS-CAL) 600 MG TABS Take 600 mg by mouth 2 (two) times daily with a meal.     ezetimibe (ZETIA) 10 MG tablet Take 1 tablet (10 mg total) by mouth daily. TAKE 1 TABLET(10 MG) BY MOUTH DAILY Strength: 10 mg 90 tablet 3   feeding supplement, ENSURE ENLIVE, (ENSURE ENLIVE) LIQD Take 237 mLs by mouth 2 (two) times daily between meals. (Patient taking differently: Take 237 mLs by mouth daily.) 237 mL 12   fluticasone (FLONASE) 50 MCG/ACT nasal spray Place 2 sprays into both nostrils daily as needed for allergies (congestion).     furosemide (LASIX) 20 MG tablet Take one tablet (20mg ) every other day. 90 tablet 3   metoprolol tartrate (LOPRESSOR) 25 MG tablet Take 1.5 tablets (37.5 mg total) by mouth 2 (two) times daily. 270 tablet 3   Multiple Vitamins-Minerals (PRESERVISION AREDS 2) CAPS Take 1 capsule by mouth 2 (two)  times daily.     nitroGLYCERIN (NITROSTAT) 0.4 MG SL tablet For chest pain, tightness, or pressure. While sitting, place 1 tablet under tongue. May be used every 5 minutes as needed, for up to 15 minutes. Do not use more than 3 tablets. 25 tablet 12   pantoprazole (PROTONIX) 40 MG tablet Take 1 tablet (40 mg total) by mouth 2 (two) times daily before a meal. 180 tablet 3   polyvinyl alcohol (LIQUIFILM TEARS) 1.4 % ophthalmic solution Place 1-2 drops into both eyes 3 (three) times daily as needed for dry eyes.     SYNTHROID 125 MCG tablet Take 125 mcg by mouth daily before breakfast.      losartan (COZAAR) 25 MG tablet TAKE 1 TABLET(25 MG) BY MOUTH DAILY (Patient not  taking: Reported on 07/20/2022) 90 tablet 1   No current facility-administered medications for this visit.     Review of Systems    ***.  All other systems reviewed and are otherwise negative except as noted above.    Physical Exam    VS:  BP 128/74   Pulse 76   Ht 5\' 5"  (1.651 m)   Wt 115 lb 12.8 oz (52.5 kg)   SpO2 98%   BMI 19.27 kg/m   GEN: Well nourished, well developed, in no acute distress. HEENT: normal. Neck: Supple, no JVD, carotid bruits, or masses. Cardiac: RRR, no murmurs, rubs, or gallops. No clubbing, cyanosis, edema.  Radials/DP/PT 2+ and equal bilaterally.  Respiratory:  Respirations regular and unlabored, clear to auscultation bilaterally. GI: Soft, nontender, nondistended, BS + x 4. MS: no deformity or atrophy. Skin: warm and dry, no rash. Neuro:  Strength and sensation are intact. Psych: Normal affect.  Accessory Clinical Findings    ECG personally reviewed by me today -atrial paced, 76 bpm- no acute changes.   Lab Results  Component Value Date   WBC 6.3 07/09/2022   HGB 13.4 07/09/2022   HCT 39.4 07/09/2022   MCV 103.4 (H) 07/09/2022   PLT 152 07/09/2022   Lab Results  Component Value Date   CREATININE 1.27 (H) 07/09/2022   BUN 33 (H) 07/09/2022   NA 138 07/09/2022   K 3.9 07/09/2022    CL 100 07/09/2022   CO2 28 07/09/2022   Lab Results  Component Value Date   ALT 15 09/19/2021   AST 27 09/19/2021   ALKPHOS 88 09/19/2021   BILITOT 0.7 09/19/2021   Lab Results  Component Value Date   CHOL 219 (H) 02/05/2018   HDL 103 02/05/2018   LDLCALC 102 (H) 02/05/2018   TRIG 70 02/05/2018   CHOLHDL 2.1 02/05/2018    Lab Results  Component Value Date   HGBA1C 5.5 01/28/2017    Assessment & Plan    1.  ***      Joylene Grapes, NP 07/20/2022, 2:40 PM

## 2022-07-22 ENCOUNTER — Encounter: Payer: Self-pay | Admitting: Nurse Practitioner

## 2022-07-23 ENCOUNTER — Telehealth: Payer: Self-pay | Admitting: Cardiology

## 2022-07-23 ENCOUNTER — Encounter (HOSPITAL_BASED_OUTPATIENT_CLINIC_OR_DEPARTMENT_OTHER): Payer: Medicare PPO | Admitting: Internal Medicine

## 2022-07-23 DIAGNOSIS — L97812 Non-pressure chronic ulcer of other part of right lower leg with fat layer exposed: Secondary | ICD-10-CM

## 2022-07-23 DIAGNOSIS — I87311 Chronic venous hypertension (idiopathic) with ulcer of right lower extremity: Secondary | ICD-10-CM

## 2022-07-23 DIAGNOSIS — T798XXA Other early complications of trauma, initial encounter: Secondary | ICD-10-CM

## 2022-07-23 DIAGNOSIS — I872 Venous insufficiency (chronic) (peripheral): Secondary | ICD-10-CM | POA: Diagnosis not present

## 2022-07-23 DIAGNOSIS — Z853 Personal history of malignant neoplasm of breast: Secondary | ICD-10-CM | POA: Diagnosis not present

## 2022-07-23 DIAGNOSIS — I350 Nonrheumatic aortic (valve) stenosis: Secondary | ICD-10-CM | POA: Diagnosis not present

## 2022-07-23 DIAGNOSIS — Z952 Presence of prosthetic heart valve: Secondary | ICD-10-CM | POA: Diagnosis not present

## 2022-07-23 DIAGNOSIS — Z7901 Long term (current) use of anticoagulants: Secondary | ICD-10-CM | POA: Diagnosis not present

## 2022-07-23 DIAGNOSIS — Z08 Encounter for follow-up examination after completed treatment for malignant neoplasm: Secondary | ICD-10-CM | POA: Diagnosis not present

## 2022-07-23 DIAGNOSIS — I480 Paroxysmal atrial fibrillation: Secondary | ICD-10-CM | POA: Diagnosis not present

## 2022-07-23 NOTE — Telephone Encounter (Signed)
Patient didn't want to give too much information, but wanted to speak with RN, Elnita Maxwell in regards to medication and appt. Please advise.

## 2022-07-23 NOTE — Telephone Encounter (Signed)
Spoke to patient she stated she recently had post hospital appointment with [name] NP.Stated she would like a sooner appointment with [name].Appointment with [name] moved up to 7/30 at 2:00 pm.

## 2022-07-24 DIAGNOSIS — E038 Other specified hypothyroidism: Secondary | ICD-10-CM | POA: Diagnosis not present

## 2022-07-24 DIAGNOSIS — I4891 Unspecified atrial fibrillation: Secondary | ICD-10-CM | POA: Diagnosis not present

## 2022-07-24 DIAGNOSIS — S51811D Laceration without foreign body of right forearm, subsequent encounter: Secondary | ICD-10-CM | POA: Diagnosis not present

## 2022-07-24 DIAGNOSIS — Z9181 History of falling: Secondary | ICD-10-CM | POA: Diagnosis not present

## 2022-07-24 DIAGNOSIS — S81811D Laceration without foreign body, right lower leg, subsequent encounter: Secondary | ICD-10-CM | POA: Diagnosis not present

## 2022-07-24 DIAGNOSIS — Z7901 Long term (current) use of anticoagulants: Secondary | ICD-10-CM | POA: Diagnosis not present

## 2022-07-24 DIAGNOSIS — I10 Essential (primary) hypertension: Secondary | ICD-10-CM | POA: Diagnosis not present

## 2022-07-24 NOTE — Progress Notes (Signed)
Remote pacemaker transmission.   

## 2022-07-25 ENCOUNTER — Ambulatory Visit (HOSPITAL_COMMUNITY)
Admission: RE | Admit: 2022-07-25 | Discharge: 2022-07-25 | Disposition: A | Discharge status: Home / Self Care | Payer: Medicare PPO | Source: Ambulatory Visit | Attending: Internal Medicine | Admitting: Internal Medicine

## 2022-07-25 VITALS — BP 134/60 | HR 67 | Ht 65.0 in | Wt 115.6 lb

## 2022-07-25 DIAGNOSIS — Z7901 Long term (current) use of anticoagulants: Secondary | ICD-10-CM | POA: Insufficient documentation

## 2022-07-25 DIAGNOSIS — D6869 Other thrombophilia: Secondary | ICD-10-CM | POA: Diagnosis not present

## 2022-07-25 DIAGNOSIS — I4819 Other persistent atrial fibrillation: Secondary | ICD-10-CM

## 2022-07-25 NOTE — Progress Notes (Addendum)
Primary Care Physician: Creola Corn, MD Cardiologist: Dr. Swaziland EP: Dr. Elberta Fortis Referring Physician: Azalee Course, PA    [name] [name] is a 89 y.o. female with a h/o PMH of PAF on Xarelto switched to Eliquis after stenting, h/o tachy-brady syndrome s/p PPM, CAD s/p DES of LAD, HFpEF, and aortic stenosis s/p TAVR. Echocardiogram in July 2015 showed a moderate aortic stenosis that has progressed since 2012. Repeat in July 2016 and July 2017 showed no change. In April 2017, she had atrial fibrillation that converted spontaneously. This was felt to be triggered by steroid injection. Her metoprolol was increased at the time. In June 2017, she was noted to be in persistent atrial fibrillation, she was placed on flecainide however later stopped due to side effect (lack of energy, not feeling right based on note 10/05/2015). She was last seen in cardiology office on 12/15/2015. She has been doing well for several months now, unfortunately, she woke up 11/26 at 2:30 AM with palpitations and ended up going to the emergency room. She also had shortness of breath as well. This is the same feeling when she has recurrent atrial fibrillation. EKG in the ED confirmed atrial fibrillation with mildly elevated heart rate. Troponin was borderline at 0.03. Creatinine 1.24. Chest x-ray was negative.  She is in the afib clinic for further evaluation. When seen by M. Heng PA. she was still in afib x 48 hours,  now has been persistent  x 4 days. She in the past increased BB and would return to SR. She has not done that this time. She is rate controlled. [name] added digoxin. The pt has not seen any big difference in rate control with digoxin and wishes to stop drug. We discussed options to restore SR. Age would most likely prohibit ablation. She is least favorable of amiodarone due to S.E.profile and baseline thyroid issues.. Sotalol and tikosyn discussed but both drugs would have to be renally dosed with creatinine cl  calculated at 30 today. She is tolerating the afib but feels better in SR with more energy and less shortness of breath. She has appointment with Dr. Johney Frame 12/14  for pacer evaluation and would like to think about options and discuss further with him then.  Pt returns to afib clinic to discuss management of afib. She is getting more short of breath and has gained 6-8 lbs. She saw her PCP yesterday and thyroid function was in range and he was ok for her to go on amiodarone if needed. She would like to avoid hospitalization and believes she would like to try amiodarone.  She has noticed LLE and some orthopnea at night.  On follow up 07/25/22, patient is currently in NSR. Seen in ED on 5/6 due to Afib and spontaneously converted to NSR while in the ED. She felt palpitations and heavy chest when in Afib episode. Continued on amiodarone 100 mg daily, Eliquis 2.5 mg BID, and metoprolol 37.5 mg BID. Last seen by Afib clinic in 02/2016. She has had no additional episodes of Afib since then. She is compliant with all her medication including Eliquis. She feels well overall.   Today, she denies symptoms of  chest pain, PND, , dizziness, presyncope, syncope, or neurologic sequela. Positive for orthopnea, LLE , weight gain, shortness of breath. The patient is tolerating medications without difficulties and is otherwise without complaint today.   Past Medical History:  Diagnosis Date   CKD (chronic kidney disease)    Complication of anesthesia  Coronary artery disease    a. 12/20/2016: s/p DES to LAD    Diverticulosis    Family history of adverse reaction to anesthesia    sister also has post-op n/v   Family history of breast cancer 09/01/2020   GERD (gastroesophageal reflux disease)    Hemorrhoids    Hyperlipidemia    Hypertension    Hypothyroidism    Osteoarthritis    Paroxysmal atrial fibrillation (HCC)    a. on OAC with Xarelto but switched to Eliquis after stenting.    Personal history of skin  cancer 09/01/2020   PONV (postoperative nausea and vomiting)    Presence of permanent cardiac pacemaker    S/P TAVR (transcatheter aortic valve replacement) 01/29/2017   23 mm Edwards Sapien 3 transcatheter heart valve placed via percutaneous right transfemoral approach    Past Surgical History:  Procedure Laterality Date   ABDOMINAL HYSTERECTOMY  1980   BASAL CELL CARCINOMA EXCISION     BREAST LUMPECTOMY WITH RADIOACTIVE SEED LOCALIZATION Left 10/04/2020   Procedure: LEFT BREAST LUMPECTOMY WITH RADIOACTIVE SEED LOCALIZATION;  Surgeon: Griselda Miner, MD;  Location: [facility] OR;  Service: General;  Laterality: Left;   CARDIAC CATHETERIZATION  ~ 11/2016   CATARACT EXTRACTION W/ INTRAOCULAR LENS  IMPLANT, BILATERAL Bilateral    CORONARY ANGIOPLASTY WITH STENT PLACEMENT  12/20/2016   "1 stent"   CORONARY STENT INTERVENTION N/A 12/20/2016   Procedure: CORONARY STENT INTERVENTION;  Surgeon: Swaziland, Peter M, MD;  Location: [facility] INVASIVE CV LAB;  Service: Cardiovascular;  Laterality: N/A;   FINGER SURGERY Left    "fell; developed skiers thumb; had to operate on it"   INSERT / REPLACE / REMOVE PACEMAKER  1993   for syptomatic bradycardia and syncope -- in [facility]   INTRAMEDULLARY (IM) NAIL INTERTROCHANTERIC Left 10/19/2018   Procedure: INTRAMEDULLARY (IM) NAIL INTERTROCHANTRIC;  Surgeon: Myrene Galas, MD;  Location: [facility] OR;  Service: Orthopedics;  Laterality: Left;   LEAD REVISION/REPAIR N/A 10/09/2016   New left subclavian MDT Adapta L PPM dual chamber system implanted by Dr Johney Frame with previously placed R subclavian system abandoned   PACEMAKER GENERATOR CHANGE  2001   pulse generator replacement by Dr. March Rummage GENERATOR CHANGE  04/01/2008   PPM Medtronic -- model # ADDRL1 serial # [id number] H -- pulse generator replacement by Dr. Reyes Ivan    RIGHT/LEFT HEART CATH AND CORONARY ANGIOGRAPHY N/A 11/27/2016   Procedure: RIGHT/LEFT HEART CATH AND CORONARY ANGIOGRAPHY;  Surgeon: Swaziland, Peter  M, MD;  Location: [facility] INVASIVE CV LAB;  Service: Cardiovascular;  Laterality: N/A;   SQUAMOUS CELL CARCINOMA EXCISION     TEE WITHOUT CARDIOVERSION N/A 01/29/2017   Procedure: TRANSESOPHAGEAL ECHOCARDIOGRAM (TEE);  Surgeon: Tonny Bollman, MD;  Location: [facility] OR;  Service: Open Heart Surgery;  Laterality: N/A;   TONSILLECTOMY     TRANSCATHETER AORTIC VALVE REPLACEMENT, TRANSFEMORAL N/A 01/29/2017   Procedure: TRANSCATHETER AORTIC VALVE REPLACEMENT, TRANSFEMORAL;  Surgeon: Tonny Bollman, MD;  Location: [facility] OR;  Service: Open Heart Surgery;  Laterality: N/A;    Current Outpatient Medications  Medication Sig Dispense Refill   amiodarone (PACERONE) 200 MG tablet TAKE 1/2 TABLET(100 MG) BY MOUTH DAILY 45 tablet 3   amLODipine (NORVASC) 5 MG tablet TAKE 1 TABLET(5 MG) BY MOUTH DAILY 90 tablet 3   apixaban (ELIQUIS) 2.5 MG TABS tablet TAKE 1 TABLET(2.5 MG) BY MOUTH TWICE DAILY 180 tablet 1   calcium carbonate (OS-CAL) 600 MG TABS Take 600 mg by mouth 2 (two) times daily with a  meal.     ezetimibe (ZETIA) 10 MG tablet Take 1 tablet (10 mg total) by mouth daily. TAKE 1 TABLET(10 MG) BY MOUTH DAILY Strength: 10 mg 90 tablet 3   feeding supplement, ENSURE ENLIVE, (ENSURE ENLIVE) LIQD Take 237 mLs by mouth 2 (two) times daily between meals. (Patient taking differently: Take 237 mLs by mouth daily.) 237 mL 12   fluticasone (FLONASE) 50 MCG/ACT nasal spray Place 2 sprays into both nostrils daily as needed for allergies (congestion).     furosemide (LASIX) 20 MG tablet Take one tablet (20mg ) every other day. 90 tablet 3   metoprolol tartrate (LOPRESSOR) 25 MG tablet Take 1.5 tablets (37.5 mg total) by mouth 2 (two) times daily. 270 tablet 3   Multiple Vitamins-Minerals (PRESERVISION AREDS 2) CAPS Take 1 capsule by mouth 2 (two) times daily.     nitroGLYCERIN (NITROSTAT) 0.4 MG SL tablet For chest pain, tightness, or pressure. While sitting, place 1 tablet under tongue. May be used every 5 minutes as needed, for  up to 15 minutes. Do not use more than 3 tablets. 25 tablet 12   pantoprazole (PROTONIX) 40 MG tablet Take 1 tablet (40 mg total) by mouth 2 (two) times daily before a meal. 180 tablet 3   polyvinyl alcohol (LIQUIFILM TEARS) 1.4 % ophthalmic solution Place 1-2 drops into both eyes 3 (three) times daily as needed for dry eyes.     SYNTHROID 125 MCG tablet Take 125 mcg by mouth daily before breakfast.      losartan (COZAAR) 25 MG tablet TAKE 1 TABLET(25 MG) BY MOUTH DAILY (Patient not taking: Reported on 07/25/2022) 90 tablet 1   No current facility-administered medications for this encounter.    Allergies  Allergen Reactions   Penicillins Anaphylaxis and Other (See Comments)    Has patient had a PCN reaction causing immediate rash, facial/tongue/throat swelling, SOB or lightheadedness with hypotension: Yes Has patient had a PCN reaction causing severe rash involving mucus membranes or skin necrosis: No Has patient had a PCN reaction that required hospitalization: No Has patient had a PCN reaction occurring within the last 10 years: No If all of the above answers are "NO", then may proceed with Cephalosporin use.    Tetanus Toxoid Anaphylaxis   Demerol Nausea Only    Severe nausea   Clindamycin/Lincomycin     Sores in mouth   Letrozole Other (See Comments)   Meningococcal Poly Tetanus Conj Vaccine Acwy Other (See Comments)   Moxifloxacin Hcl Other (See Comments)   Codeine Nausea Only    Severe nausea    Social History   Socioeconomic History   Marital status: Widowed    Spouse name: Not on file   Number of children: 0   Years of education: Not on file   Highest education level: Not on file  Occupational History    Employer: RETIRED  Tobacco Use   Smoking status: Former    Packs/day: 1.50    Years: 30.00    Additional pack years: 0.00    Total pack years: 45.00    Types: Cigarettes    Quit date: 03/05/1978    Years since quitting: 44.4   Smokeless tobacco: Never  Vaping  Use   Vaping Use: Never used  Substance and Sexual Activity   Alcohol use: No   Drug use: No   Sexual activity: Not on file  Other Topics Concern   Not on file  Social History Narrative   Not on file   Social Determinants  of Health   Financial Resource Strain: Low Risk  (01/16/2021)   Overall Financial Resource Strain (CARDIA)    Difficulty of Paying Living Expenses: Not hard at all  Food Insecurity: No Food Insecurity (01/16/2021)   Hunger Vital Sign    Worried About Running Out of Food in the Last Year: Never true    Ran Out of Food in the Last Year: Never true  Transportation Needs: No Transportation Needs (01/16/2021)   PRAPARE - Administrator, Civil Service (Medical): No    Lack of Transportation (Non-Medical): No  Physical Activity: Insufficiently Active (01/16/2021)   Exercise Vital Sign    Days of Exercise per Week: 2 days    Minutes of Exercise per Session: 30 min  Stress: No Stress Concern Present (01/16/2021)   [employer] of Occupational Health - Occupational Stress Questionnaire    Feeling of Stress : Not at all  Social Connections: Moderately Isolated (01/16/2021)   Social Connection and Isolation Panel [NHANES]    Frequency of Communication with Friends and Family: Three times a week    Frequency of Social Gatherings with Friends and Family: Twice a week    Attends Religious Services: 1 to 4 times per year    Active Member of [employer] or Organizations: No    Attends Banker Meetings: Never    Marital Status: Widowed  Intimate Partner Violence: Not At Risk (01/16/2021)   Humiliation, Afraid, Rape, and Kick questionnaire    Fear of Current or Ex-Partner: No    Emotionally Abused: No    Physically Abused: No    Sexually Abused: No    Family History  Problem Relation Age of Onset   Alzheimer's disease Mother    Kidney cancer Father        dx 78s   Breast cancer Maternal Aunt 58   Breast cancer Cousin        maternal cousin,  dx >50    ROS- All systems are reviewed and negative except as per the HPI above  Physical Exam: Vitals:   07/25/22 1342  BP: 134/60  Pulse: 67  Weight: 52.4 kg  Height: 5\' 5"  (1.651 m)    GEN- The patient is well appearing, alert and oriented x 3 today.   Head- normocephalic, atraumatic Eyes-  Sclera clear, conjunctiva pink Ears- hearing intact Lungs- Clear to ausculation bilaterally, normal work of breathing Heart- Regular rate and rhythm, no murmurs, rubs or gallops, PMI not laterally displaced Extremities- no clubbing, cyanosis, or edema MS- no significant deformity or atrophy Skin- no rash or lesion Psych- euthymic mood, full affect Neuro- strength and sensation are intact   EKG-  Vent. rate 67 BPM PR interval 236 ms QRS duration 90 ms QT/QTcB 380/401 ms P-R-T axes 93 84 79 Atrial-paced rhythm with prolonged AV conduction Abnormal ECG When compared with ECG of 09-Jul-2022 04:54, PREVIOUS ECG IS PRESENT  ECHO 07/26/20:  1. Left ventricular ejection fraction, by estimation, is 60 to 65%. The  left ventricle has normal function. The left ventricle has no regional  wall motion abnormalities. Left ventricular diastolic parameters are  indeterminate.   2. Right ventricular systolic function is normal. The right ventricular  size is normal.   3. Mild mitral valve regurgitation.   4. Tricuspid valve regurgitation is moderate.   5. S/p TAVR (01/29/17) 23 mm Edwards bioprosthesis. Peak and mean  gradients through the valve are 17 and 8 mm Hg repcectively AVA (VTI 1.53  cm2) No significant change  from gradients in echo of 01/22/18 . Aortic  valve regurgitation is mild.   Assessment and Plan: 1.Persistent afib  Discussion of options including increasing amiodarone - at this time both patient and I are in agreement this is not currently indicated.  With patient and caregiver in room, recommended to please contact either cardiology office or our office if back in Afib to  help titrate metoprolol and see if we can avoid future ED visits.  Continue amiodarone 100 mg daily. Recent labs drawn by PCP are in Care Everywhere.  2. Secondary hypercoagulable state due to atrial fibrillation Continue Eliquis 2.5 mg BID  3.PPM- h/o  tachy/brady  Followed by Dr. Elberta Fortis  F/u Afib clinic prn   [place] Bells, PA-C Afib Clinic [facility] [address] [place], [place] [address] [phone]

## 2022-07-25 NOTE — Addendum Note (Signed)
Encounter addended by: Eustace Pen, PA-C on: 07/25/2022 3:12 PM  Actions taken: Clinical Note Signed

## 2022-07-31 ENCOUNTER — Encounter (HOSPITAL_BASED_OUTPATIENT_CLINIC_OR_DEPARTMENT_OTHER): Payer: Medicare PPO | Admitting: Internal Medicine

## 2022-07-31 DIAGNOSIS — I872 Venous insufficiency (chronic) (peripheral): Secondary | ICD-10-CM | POA: Diagnosis not present

## 2022-07-31 DIAGNOSIS — T798XXA Other early complications of trauma, initial encounter: Secondary | ICD-10-CM | POA: Diagnosis not present

## 2022-07-31 DIAGNOSIS — L97812 Non-pressure chronic ulcer of other part of right lower leg with fat layer exposed: Secondary | ICD-10-CM

## 2022-07-31 DIAGNOSIS — I480 Paroxysmal atrial fibrillation: Secondary | ICD-10-CM | POA: Diagnosis not present

## 2022-07-31 DIAGNOSIS — Z853 Personal history of malignant neoplasm of breast: Secondary | ICD-10-CM | POA: Diagnosis not present

## 2022-07-31 DIAGNOSIS — Z7901 Long term (current) use of anticoagulants: Secondary | ICD-10-CM | POA: Diagnosis not present

## 2022-07-31 DIAGNOSIS — I87311 Chronic venous hypertension (idiopathic) with ulcer of right lower extremity: Secondary | ICD-10-CM

## 2022-07-31 DIAGNOSIS — Z08 Encounter for follow-up examination after completed treatment for malignant neoplasm: Secondary | ICD-10-CM | POA: Diagnosis not present

## 2022-07-31 DIAGNOSIS — I350 Nonrheumatic aortic (valve) stenosis: Secondary | ICD-10-CM | POA: Diagnosis not present

## 2022-07-31 DIAGNOSIS — Z952 Presence of prosthetic heart valve: Secondary | ICD-10-CM | POA: Diagnosis not present

## 2022-08-06 ENCOUNTER — Encounter (HOSPITAL_BASED_OUTPATIENT_CLINIC_OR_DEPARTMENT_OTHER): Payer: Medicare PPO | Admitting: Internal Medicine

## 2022-08-06 DIAGNOSIS — I700 Atherosclerosis of aorta: Secondary | ICD-10-CM | POA: Diagnosis not present

## 2022-08-06 DIAGNOSIS — R251 Tremor, unspecified: Secondary | ICD-10-CM | POA: Diagnosis not present

## 2022-08-06 DIAGNOSIS — I5030 Unspecified diastolic (congestive) heart failure: Secondary | ICD-10-CM | POA: Diagnosis not present

## 2022-08-06 DIAGNOSIS — I130 Hypertensive heart and chronic kidney disease with heart failure and stage 1 through stage 4 chronic kidney disease, or unspecified chronic kidney disease: Secondary | ICD-10-CM | POA: Diagnosis not present

## 2022-08-06 DIAGNOSIS — I480 Paroxysmal atrial fibrillation: Secondary | ICD-10-CM | POA: Diagnosis not present

## 2022-08-06 DIAGNOSIS — N1831 Chronic kidney disease, stage 3a: Secondary | ICD-10-CM | POA: Diagnosis not present

## 2022-08-06 DIAGNOSIS — I350 Nonrheumatic aortic (valve) stenosis: Secondary | ICD-10-CM | POA: Diagnosis not present

## 2022-08-06 DIAGNOSIS — Z7901 Long term (current) use of anticoagulants: Secondary | ICD-10-CM | POA: Diagnosis not present

## 2022-08-06 DIAGNOSIS — D692 Other nonthrombocytopenic purpura: Secondary | ICD-10-CM | POA: Diagnosis not present

## 2022-08-07 ENCOUNTER — Encounter (HOSPITAL_BASED_OUTPATIENT_CLINIC_OR_DEPARTMENT_OTHER): Payer: Medicare PPO | Attending: Internal Medicine | Admitting: Internal Medicine

## 2022-08-07 DIAGNOSIS — I872 Venous insufficiency (chronic) (peripheral): Secondary | ICD-10-CM | POA: Insufficient documentation

## 2022-08-07 DIAGNOSIS — I480 Paroxysmal atrial fibrillation: Secondary | ICD-10-CM | POA: Diagnosis not present

## 2022-08-07 DIAGNOSIS — C50412 Malignant neoplasm of upper-outer quadrant of left female breast: Secondary | ICD-10-CM | POA: Insufficient documentation

## 2022-08-07 DIAGNOSIS — Z7901 Long term (current) use of anticoagulants: Secondary | ICD-10-CM | POA: Insufficient documentation

## 2022-08-07 DIAGNOSIS — I350 Nonrheumatic aortic (valve) stenosis: Secondary | ICD-10-CM | POA: Diagnosis not present

## 2022-08-07 DIAGNOSIS — L97812 Non-pressure chronic ulcer of other part of right lower leg with fat layer exposed: Secondary | ICD-10-CM | POA: Insufficient documentation

## 2022-08-07 DIAGNOSIS — Z952 Presence of prosthetic heart valve: Secondary | ICD-10-CM | POA: Diagnosis not present

## 2022-08-07 DIAGNOSIS — I87311 Chronic venous hypertension (idiopathic) with ulcer of right lower extremity: Secondary | ICD-10-CM | POA: Insufficient documentation

## 2022-08-14 ENCOUNTER — Encounter (HOSPITAL_BASED_OUTPATIENT_CLINIC_OR_DEPARTMENT_OTHER): Payer: Medicare PPO | Admitting: Internal Medicine

## 2022-08-14 NOTE — Progress Notes (Signed)
TAHIRA, RUSCH ([id number]) [id number] Page 1 of 5 Visit Report for 07/10/2022 HPI Details Patient Name: Date of Service: [place], [place] 07/10/2022 11:45 A M Medical Record Number: [id number] Patient Account Number: [id number] Date of Birth/Sex: Treating RN: [birth year] (89 y.o. F) Primary Care Provider: Creola Corn Other Clinician: Referring Provider: Treating Provider/Extender: Lacretia Leigh in Treatment: 1 History of Present Illness HPI Description: 07/03/2022 [name] is a 89 year old female with a past medical history of left breast cancer, severe aortic stenosis status post TAVR, paroxysmal A-fib on Eliquis and venous insufficiency that presents to the clinic for a 2-week history of nonhealing wound to the right anterior leg. She states she hit her leg against a car door. She has been using mupirocin ointment to the wound bed and she has been prescribed doxycycline. She is currently taking this. She currently denies systemic signs of infection. She has compression stockings but does not wear them. 5/7; this is a 89 year old woman who traumatized her leg on a car door several weeks ago this is in the setting of chronic venous insufficiency. She tells me she has had previous wounds that have healed although I do not have that in the record. Her wound today is measuring smaller. We have been using Hydrofera Blue, Zetuvit, under Urgo 2 compression. It is not clear if she has medical grade compression stockings at home but in any case she does not wear them Electronic Signature(s) Signed: 07/10/2022 3:53:26 PM By: Baltazar Najjar MD Entered By: Baltazar Najjar on 07/10/2022 12:37:41 -------------------------------------------------------------------------------- Chemical Cauterization Details Patient Name: Date of Service: [name] [name]. 07/10/2022 11:45 A M Medical Record Number: [id number] Patient Account Number: [id number] Date of  Birth/Sex: Treating RN: 06-Nov-1929 (89 y.o. [name] Primary Care Provider: Creola Corn Other Clinician: Referring Provider: Treating Provider/Extender: Lacretia Leigh in Treatment: 1 Procedure Performed for: Wound #1 Right,Lateral Lower Leg Performed By: Clinician Brenton Grills, RN Post Procedure Diagnosis Same as Pre-procedure Electronic Signature(s) Signed: 07/10/2022 3:53:26 PM By: Baltazar Najjar MD Signed: 08/14/2022 7:49:05 AM By: Brenton Grills Entered By: Brenton Grills on 07/10/2022 12:52:56 -------------------------------------------------------------------------------- Physical Exam Details Patient Name: Date of Service: [name] [name]. 07/10/2022 11:45 A M Medical Record Number: [id number] Patient Account Number: [id number] Date of Birth/Sex: Treating RN: 06/04/1929 (89 y.o. F) Primary Care Provider: Creola Corn Other Clinician: Referring Provider: Treating Provider/Extender: Lacretia Leigh in Treatment: 1 Constitutional Sitting or standing Blood Pressure is within target range for patient.. Pulse regular and within target range for patient.Marland Kitchen Respirations regular, non-labored and within target range.. Temperature is normal and within the target range for the patient.Marland Kitchen Appears in no distress. Notes [name] [name], [name] [name] ([id number]) [id number] Page 2 of 5 Wound exam right lower extremity on the anterior aspect. Initially traumatic. The wound itself has decent looking granulation tissue. There is some slough on the surface. Intake reported easy bleeding I did not debride this. There is no evidence of surrounding infection although some of the healed areas look very fragile as well. Electronic Signature(s) Signed: 07/10/2022 3:53:26 PM By: Baltazar Najjar MD Entered By: Baltazar Najjar on 07/10/2022 12:38:27 -------------------------------------------------------------------------------- Physician Orders  Details Patient Name: Date of Service: [name] [name]. 07/10/2022 11:45 A M Medical Record Number: [id number] Patient Account Number: [id number] Date of Birth/Sex: Treating RN: 09/25/1929 (89 y.o. [name] Primary Care Provider: Creola Corn Other Clinician: Referring Provider: Treating Provider/Extender: Lacretia Leigh in Treatment: 1 Verbal / Phone  Orders: No Diagnosis Coding Follow-up Appointments ppointment in 1 week. - Dr. Mikey Bussing Return A Anesthetic (In clinic) Topical Lidocaine 4% applied to wound bed Bathing/ Shower/ Hygiene May shower with protection but do not get wound dressing(s) wet. Protect dressing(s) with water repellant cover (for example, large plastic bag) or a cast cover and may then take shower. Edema Control - Lymphedema / SCD / Other Left Lower Extremity Elevate legs to the level of the heart or above for 30 minutes daily and/or when sitting for 3-4 times a day throughout the day. A void standing for long periods of time. Compression stocking or Garment 30-40 mm/Hg pressure to: If compression wraps slide down please call wound center and speak with a nurse. Wound Treatment Wound #1 - Lower Leg Wound Laterality: Right, Lateral Cleanser: Vashe 5.8 (oz) Discharge Instructions: Cleanse the wound with Vashe prior to applying a clean dressing using gauze sponges, not tissue or cotton balls. Topical: Mupirocin Ointment Discharge Instructions: Apply Mupirocin (Bactroban) as instructed Prim Dressing: Hydrofera Blue Ready Transfer Foam, 4x5 (in/in) ary Discharge Instructions: Apply to wound bed as instructed Secondary Dressing: Zetuvit Plus Silicone Border Dressing 5x5 (in/in) Discharge Instructions: Apply silicone border over primary dressing as directed. Secured With: Paper Tape, 1x10 (in/yd) Discharge Instructions: Secure dressing with tape as directed. Compression Wrap: Urgo K2, two layer compression system, regular Discharge  Instructions: Apply Urgo K2 as directed (alternative to 4 layer compression). Electronic Signature(s) Signed: 07/10/2022 3:53:26 PM By: Baltazar Najjar MD Signed: 08/14/2022 7:49:05 AM By: Brenton Grills Entered By: Brenton Grills on 07/10/2022 12:18:43 -------------------------------------------------------------------------------- Problem List Details Patient Name: Date of Service: [name] [name]. 07/10/2022 11:45 A M Medical Record Number: [id number] Patient Account Number: [id number] Date of Birth/Sex: Treating RN: 04-Nov-1929 (89 y.o. [name] Primary Care Provider: Creola Corn Other Clinician: Referring Provider: Treating Provider/Extender: [place], [place], [name] ([id number]) [phone].pdf Page 3 of 5 Weeks in Treatment: 1 Active Problems ICD-10 Encounter Code Description Active Date MDM Diagnosis L97.812 Non-pressure chronic ulcer of other part of right lower leg with fat layer 07/03/2022 No Yes exposed I87.311 Chronic venous hypertension (idiopathic) with ulcer of right lower extremity 07/03/2022 No Yes T79.8XXA Other early complications of trauma, initial encounter 07/03/2022 No Yes Z95.2 Presence of prosthetic heart valve 07/03/2022 No Yes I48.0 Paroxysmal atrial fibrillation 07/03/2022 No Yes Z79.01 Long term (current) use of anticoagulants 07/03/2022 No Yes C50.412 Malignant neoplasm of upper-outer quadrant of left female breast 07/03/2022 No Yes Inactive Problems Resolved Problems Electronic Signature(s) Signed: 07/10/2022 3:53:26 PM By: Baltazar Najjar MD Entered By: Baltazar Najjar on 07/10/2022 12:35:04 -------------------------------------------------------------------------------- Progress Note Details Patient Name: Date of Service: [name] [name]. 07/10/2022 11:45 A M Medical Record Number: [id number] Patient Account Number: [id number] Date of Birth/Sex: Treating RN: 08-Aug-1929 (89 y.o. F) Primary Care Provider:  Creola Corn Other Clinician: Referring Provider: Treating Provider/Extender: Lacretia Leigh in Treatment: 1 Subjective History of Present Illness (HPI) 07/03/2022 [name] is a 89 year old female with a past medical history of left breast cancer, severe aortic stenosis status post TAVR, paroxysmal A-fib on Eliquis and venous insufficiency that presents to the clinic for a 2-week history of nonhealing wound to the right anterior leg. She states she hit her leg against a car door. She has been using mupirocin ointment to the wound bed and she has been prescribed doxycycline. She is currently taking this. She currently denies systemic signs of infection. She has compression stockings but does not wear them. 5/7;  this is a 89 year old woman who traumatized her leg on a car door several weeks ago this is in the setting of chronic venous insufficiency. She tells me she has had previous wounds that have healed although I do not have that in the record. Her wound today is measuring smaller. We have been using Hydrofera Blue, Zetuvit, under Urgo 2 compression. It is not clear if she has medical grade compression stockings at home but in any case she does not wear them [name] [name], [name] [name] ([id number]) [id number] Page 4 of 5 Objective Constitutional Sitting or standing Blood Pressure is within target range for patient.. Pulse regular and within target range for patient.Marland Kitchen Respirations regular, non-labored and within target range.. Temperature is normal and within the target range for the patient.Marland Kitchen Appears in no distress. Vitals Time Taken: 12:07 PM, Height: 65 in, Weight: 110 lbs, BMI: 18.3, Temperature: 97.4 F, Pulse: 63 bpm, Respiratory Rate: 18 breaths/min, Blood Pressure: 132/71 mmHg. General Notes: Wound exam right lower extremity on the anterior aspect. Initially traumatic. The wound itself has decent looking granulation tissue. There is some slough  on the surface. Intake reported easy bleeding I did not debride this. There is no evidence of surrounding infection although some of the healed areas look very fragile as well. Integumentary (Hair, Skin) Wound #1 status is Open. Original cause of wound was Skin T ear/Laceration. The date acquired was: 06/19/2022. The wound has been in treatment 1 weeks. The wound is located on the Right,Lateral Lower Leg. The wound measures 4cm length x 2cm width x 0.1cm depth; 6.283cm^2 area and 0.628cm^3 volume. There is Fat Layer (Subcutaneous Tissue) exposed. There is no tunneling or undermining noted. There is a medium amount of serosanguineous drainage noted. There is medium (34-66%) red, pink granulation within the wound bed. There is a medium (34-66%) amount of necrotic tissue within the wound bed including Adherent Slough. The periwound skin appearance exhibited: Scarring, Hemosiderin Staining. The periwound skin appearance did not exhibit: Dry/Scaly, Maceration. Periwound temperature was noted as No Abnormality. Assessment Active Problems ICD-10 Non-pressure chronic ulcer of other part of right lower leg with fat layer exposed Chronic venous hypertension (idiopathic) with ulcer of right lower extremity Other early complications of trauma, initial encounter Presence of prosthetic heart valve Paroxysmal atrial fibrillation Long term (current) use of anticoagulants Malignant neoplasm of upper-outer quadrant of left female breast Procedures Wound #1 Pre-procedure diagnosis of Wound #1 is an Abrasion located on the Right,Lateral Lower Leg . An Chemical Cauterization procedure was performed by Brenton Grills, RN. Post procedure Diagnosis Wound #1: Same as Pre-Procedure Plan Follow-up Appointments: Return Appointment in 1 week. - Dr. Mikey Bussing Anesthetic: (In clinic) Topical Lidocaine 4% applied to wound bed Bathing/ Shower/ Hygiene: May shower with protection but do not get wound dressing(s) wet.  Protect dressing(s) with water repellant cover (for example, large plastic bag) or a cast cover and may then take shower. Edema Control - Lymphedema / SCD / Other: Elevate legs to the level of the heart or above for 30 minutes daily and/or when sitting for 3-4 times a day throughout the day. Avoid standing for long periods of time. Compression stocking or Garment 30-40 mm/Hg pressure to: If compression wraps slide down please call wound center and speak with a nurse. WOUND #1: - Lower Leg Wound Laterality: Right, Lateral Cleanser: Vashe 5.8 (oz) Discharge Instructions: Cleanse the wound with Vashe prior to applying a clean dressing using gauze sponges, not tissue or cotton balls. Topical: Mupirocin Ointment Discharge Instructions: Apply Mupirocin (  Bactroban) as instructed Prim Dressing: Hydrofera Blue Ready Transfer Foam, 4x5 (in/in) ary Discharge Instructions: Apply to wound bed as instructed Secondary Dressing: Zetuvit Plus Silicone Border Dressing 5x5 (in/in) Discharge Instructions: Apply silicone border over primary dressing as directed. Secured With: Paper T ape, 1x10 (in/yd) Discharge Instructions: Secure dressing with tape as directed. Com pression Wrap: Urgo K2, two layer compression system, regular Discharge Instructions: Apply Urgo K2 as directed (alternative to 4 layer compression). [name] [name], [name] [name] ([id number]) [id number] Page 5 of 5 1. Continue the same dressing which is topical antibiotics, Hydrofera Blue as it to fit and compression. She seems to be making progress 2. No evidence of infection Electronic Signature(s) Signed: 07/11/2022 11:31:33 AM By: Shawn Stall RN, BSN Signed: 07/11/2022 11:42:05 AM By: Baltazar Najjar MD Previous Signature: 07/10/2022 3:53:26 PM Version By: Baltazar Najjar MD Entered By: Shawn Stall on 07/11/2022 11:28:33 -------------------------------------------------------------------------------- SuperBill Details Patient  Name: Date of Service: Lynford Humphrey, [place]. 07/10/2022 Medical Record Number: [id number] Patient Account Number: [id number] Date of Birth/Sex: Treating RN: 1929-03-31 (89 y.o. F) Primary Care Provider: Creola Corn Other Clinician: Referring Provider: Treating Provider/Extender: Lacretia Leigh in Treatment: 1 Diagnosis Coding ICD-10 Codes Code Description [id number] Non-pressure chronic ulcer of other part of right lower leg with fat layer exposed I87.311 Chronic venous hypertension (idiopathic) with ulcer of right lower extremity T79.8XXA Other early complications of trauma, initial encounter Z95.2 Presence of prosthetic heart valve I48.0 Paroxysmal atrial fibrillation Z79.01 Long term (current) use of anticoagulants C50.412 Malignant neoplasm of upper-outer quadrant of left female breast Facility Procedures : CPT4 Code: [id number] Description: 99213 - WOUND CARE VISIT-LEV 3 EST PT Modifier: 25 Quantity: 1 Physician Procedures : CPT4 Code Description Modifier [id number] - WC PHYS LEVEL 3 - EST PT ICD-10 Diagnosis Description L97.812 Non-pressure chronic ulcer of other part of right lower leg with fat layer exposed I87.311 Chronic venous hypertension (idiopathic) with ulcer  of right lower extremity Quantity: 1 Electronic Signature(s) Signed: 07/10/2022 3:53:26 PM By: Baltazar Najjar MD Signed: 08/14/2022 7:49:05 AM By: Brenton Grills Entered By: Brenton Grills on 07/10/2022 12:52:25

## 2022-08-14 NOTE — Progress Notes (Signed)
[name] ([id number]) [id number] Page 1 of 6 Visit Report for 07/10/2022 Arrival Information Details Patient Name: Date of Service: PELLICANO, [name] [name] 07/10/2022 11:45 A M Medical Record Number: [id number] Patient Account Number: [id number] Date of Birth/Sex: Treating RN: 11/11/1929 (89 y.o. [name] [name] Primary Care Merlyn Bollen: Creola Corn Other Clinician: Referring Berneta Sconyers: Treating Maclovia Uher/Extender: Lacretia Leigh in Treatment: 1 Visit Information History Since Last Visit All ordered tests and consults were completed: Yes Patient Arrived: Ambulatory Added or deleted any medications: No Arrival Time: 12:05 Any new allergies or adverse reactions: No Accompanied By: daughter Had a fall or experienced change in No Transfer Assistance: None activities of daily living that may affect Patient Identification Verified: Yes risk of falls: Secondary Verification Process Completed: Yes Signs or symptoms of abuse/neglect since last visito No Patient Requires Transmission-Based Precautions: No Hospitalized since last visit: No Patient Has Alerts: No Implantable device outside of the clinic excluding No cellular tissue based products placed in the center since last visit: Pain Present Now: No Electronic Signature(s) Signed: 08/14/2022 7:49:05 AM By: Brenton Grills Entered By: Brenton Grills on 07/10/2022 12:07:33 -------------------------------------------------------------------------------- Clinic Level of Care Assessment Details Patient Name: Date of Service: [name], [name] 07/10/2022 11:45 A M Medical Record Number: [id number] Patient Account Number: [id number] Date of Birth/Sex: Treating RN: March 01, 1930 (89 y.o. [name] [name] Primary Care Elsye Mccollister: Creola Corn Other Clinician: Referring Rayaan Garguilo: Treating Bandon Sherwin/Extender: Lacretia Leigh in Treatment: 1 Clinic Level of Care Assessment Items TOOL 1  Quantity Score X- 1 0 Use when EandM and Procedure is performed on INITIAL visit ASSESSMENTS - Nursing Assessment / Reassessment X- 1 20 General Physical Exam (combine w/ comprehensive assessment (listed just below) when performed on new pt. evals) X- 1 25 Comprehensive Assessment (HX, ROS, Risk Assessments, Wounds Hx, etc.) ASSESSMENTS - Wound and Skin Assessment / Reassessment X- 1 10 Dermatologic / Skin Assessment (not related to wound area) ASSESSMENTS - Ostomy and/or Continence Assessment and Care []  - 0 Incontinence Assessment and Management []  - 0 Ostomy Care Assessment and Management (repouching, etc.) PROCESS - Coordination of Care X - Simple Patient / Family Education for ongoing care 1 15 []  - 0 Complex (extensive) Patient / Family Education for ongoing care X- 1 10 Staff obtains Chiropractor, Records, T Results / Process Orders est []  - 0 Staff telephones HHA, Nursing Homes / Clarify orders / etc []  - 0 Routine Transfer to another Facility (non-emergent condition) []  - 0 Routine Hospital Admission (non-emergent condition) []  - 0 New Admissions / Insurance Authorizations / Ordering NPWT Apligraf, etcDENIZ, [name] ([id number]) [id number] Page 2 of 6 []  - 0 Emergency Hospital Admission (emergent condition) PROCESS - Special Needs []  - 0 Pediatric / Minor Patient Management []  - 0 Isolation Patient Management []  - 0 Hearing / Language / Visual special needs []  - 0 Assessment of Community assistance (transportation, D/C planning, etc.) []  - 0 Additional assistance / Altered mentation []  - 0 Support Surface(s) Assessment (bed, cushion, seat, etc.) INTERVENTIONS - Miscellaneous []  - 0 External ear exam []  - 0 Patient Transfer (multiple staff / Nurse, adult / Similar devices) X- 1 5 Simple Staple / Suture removal (25 or less) []  - 0 Complex Staple / Suture removal (26 or more) []  - 0 Hypo/Hyperglycemic Management (do not check if  billed separately) []  - 0 Ankle / Brachial Index (ABI) - do not check if billed separately Has the patient been seen at the hospital within  the last three years: Yes Total Score: 85 Level Of Care: New/Established - Level 3 Electronic Signature(s) Signed: 08/14/2022 7:49:05 AM By: Brenton Grills Entered By: Brenton Grills on 07/10/2022 12:52:10 -------------------------------------------------------------------------------- Encounter Discharge Information Details Patient Name: Date of Service: [name] Fischer. 07/10/2022 11:45 A M Medical Record Number: [id number] Patient Account Number: [id number] Date of Birth/Sex: Treating RN: [birth year] (89 y.o. [name] Primary Care Madisyn Mawhinney: Creola Corn Other Clinician: Referring Osias Resnick: Treating Kimyah Frein/Extender: Lacretia Leigh in Treatment: 1 Encounter Discharge Information Items Discharge Condition: Stable Ambulatory Status: Cane Discharge Destination: Home Transportation: Private Auto Accompanied By: caregiver Schedule Follow-up Appointment: Yes Clinical Summary of Care: Patient Declined Electronic Signature(s) Signed: 08/14/2022 7:49:05 AM By: Brenton Grills Entered By: Brenton Grills on 07/10/2022 12:54:01 -------------------------------------------------------------------------------- Lower Extremity Assessment Details Patient Name: Date of Service: [name] Fischer. 07/10/2022 11:45 A M Medical Record Number: [id number] Patient Account Number: [id number] Date of Birth/Sex: Treating RN: 01-11-1930 (89 y.o. [name] Primary Care Aanvi Voyles: Creola Corn Other Clinician: Referring Ziggy Chanthavong: Treating Luma Clopper/Extender: Lacretia Leigh in Treatment: 1 Edema Assessment Assessed: [name]: No] [name]: No] [Left: Edema] [name]: :] D[LeftLavonne [name] ([id number])] [Right: [id number] Page 3 of 6] Calf Left: Right: Point of Measurement: From Medial Instep 31  cm Ankle Left: Right: Point of Measurement: From Medial Instep 18.5 cm Vascular Assessment Pulses: Dorsalis Pedis Palpable: [Right:Yes] Electronic Signature(s) Signed: 08/14/2022 7:49:05 AM By: Brenton Grills Entered By: Brenton Grills on 07/10/2022 12:12:51 -------------------------------------------------------------------------------- Multi Wound Chart Details Patient Name: Date of Service: [name] Fischer. 07/10/2022 11:45 A M Medical Record Number: [id number] Patient Account Number: [id number] Date of Birth/Sex: Treating RN: 06/16/1929 (89 y.o. F) Primary Care Floye Fesler: Creola Corn Other Clinician: Referring Tiawana Forgy: Treating Ling Flesch/Extender: Lacretia Leigh in Treatment: 1 Vital Signs Height(in): 65 Pulse(bpm): 63 Weight(lbs): 110 Blood Pressure(mmHg): 132/71 Body Mass Index(BMI): 18.3 Temperature(F): 97.4 Respiratory Rate(breaths/min): 18 [1:Photos:] [N/A:N/A] Right, Lateral Lower Leg N/A N/A Wound Location: Skin Tear/Laceration N/A N/A Wounding Event: Abrasion N/A N/A Primary Etiology: Arrhythmia, Coronary Artery Disease, N/A N/A Comorbid History: Hypertension, Osteoarthritis 06/19/2022 N/A N/A Date Acquired: 1 N/A N/A Weeks of Treatment: Open N/A N/A Wound Status: No N/A N/A Wound Recurrence: 4x2x0.1 N/A N/A Measurements L x W x D (cm) 6.283 N/A N/A A (cm) : rea 0.628 N/A N/A Volume (cm) : 11.10% N/A N/A % Reduction in Area: 11.20% N/A N/A % Reduction in Volume: Full Thickness Without Exposed N/A N/A Classification: Support Structures Medium N/A N/A Exudate A mount: Serosanguineous N/A N/A Exudate Type: red, brown N/A N/A Exudate Color: Medium (34-66%) N/A N/A Granulation A mount: Red, Pink N/A N/A Granulation Quality: Medium (34-66%) N/A N/A Necrotic A mount: Fat Layer (Subcutaneous Tissue): Yes N/A N/A Exposed Structures: None N/A N/A Epithelialization: Scarring: Yes N/A N/A Periwound Skin Texture: Maceration:  No N/A N/A Periwound [name], [name] ([id number]) [id number] Page 4 of 6 Dry/Scaly: No Hemosiderin Staining: Yes N/A N/A Periwound Skin Color: No Abnormality N/A N/A Temperature: Treatment Notes Electronic Signature(s) Signed: 07/10/2022 3:53:26 PM By: Baltazar Najjar MD Entered By: Baltazar Najjar on 07/10/2022 12:35:12 -------------------------------------------------------------------------------- Multi-Disciplinary Care Plan Details Patient Name: Date of Service: [name] Fischer. 07/10/2022 11:45 A M Medical Record Number: [id number] Patient Account Number: [id number] Date of Birth/Sex: Treating RN: 1929/09/24 (89 y.o. [name] Primary Care Hedwig Mcfall: Creola Corn Other Clinician: Referring Dezerae Freiberger: Treating Carlisha Wisler/Extender: Lacretia Leigh in Treatment: 1 Active Inactive Wound/Skin Impairment Nursing Diagnoses: Impaired tissue  integrity Goals: Patient/caregiver will verbalize understanding of skin care regimen Date Initiated: 07/03/2022 Target Resolution Date: 08/24/2022 Goal Status: Active Interventions: Assess patient/caregiver ability to obtain necessary supplies Assess patient/caregiver ability to perform ulcer/skin care regimen upon admission and as needed Assess ulceration(s) every visit Provide education on ulcer and skin care Screen for HBO Treatment Activities: Skin care regimen initiated : 07/03/2022 Topical wound management initiated : 07/03/2022 Notes: Electronic Signature(s) Signed: 08/14/2022 7:49:05 AM By: Brenton Grills Entered By: Brenton Grills on 07/10/2022 12:18:56 -------------------------------------------------------------------------------- Pain Assessment Details Patient Name: Date of Service: [name] Fischer. 07/10/2022 11:45 A M Medical Record Number: [id number] Patient Account Number: [id number] Date of Birth/Sex: Treating RN: 05/27/1929 (89 y.o. [name] Primary Care  Concha Sudol: Creola Corn Other Clinician: Referring Seraphim Affinito: Treating Kendrah Lovern/Extender: Lacretia Leigh in Treatment: 1 Active Problems Location of Pain Severity and Description of Pain Patient Has Paino No Site Locations [name], [name] ([id number]) [phone].pdf Page 5 of 6 Pain Management and Medication Current Pain Management: Electronic Signature(s) Signed: 08/14/2022 7:49:05 AM By: Brenton Grills Entered By: Brenton Grills on 07/10/2022 12:11:16 -------------------------------------------------------------------------------- Patient/Caregiver Education Details Patient Name: Date of Service: [name] Fischer 5/7/2024andnbsp11:45 A M Medical Record Number: [id number] Patient Account Number: [id number] Date of Birth/Gender: Treating RN: 1929/11/14 (89 y.o. [name] Primary Care Physician: Creola Corn Other Clinician: Referring Physician: Treating Physician/Extender: Lacretia Leigh in Treatment: 1 Education Assessment Education Provided To: Patient and Caregiver Education Topics Provided Wound/Skin Impairment: Methods: Explain/Verbal Responses: State content correctly Electronic Signature(s) Signed: 08/14/2022 7:49:05 AM By: Brenton Grills Entered By: Brenton Grills on 07/10/2022 12:19:26 -------------------------------------------------------------------------------- Wound Assessment Details Patient Name: Date of Service: [name] Fischer. 07/10/2022 11:45 A M Medical Record Number: [id number] Patient Account Number: [id number] Date of Birth/Sex: Treating RN: 04-Sep-1929 (89 y.o. [name] Primary Care Samariyah Cowles: Creola Corn Other Clinician: Referring Celester Lech: Treating Chayil Gantt/Extender: Lacretia Leigh in Treatment: 1 Wound Status Wound Number: 1 Primary Abrasion Etiology: Wound Location: Right, Lateral Lower Leg Wound Status: Open Wounding Event: Skin  Tear/Laceration Comorbid Arrhythmia, Coronary Artery Disease, Hypertension, Date Acquired: 06/19/2022 History: Osteoarthritis Weeks Of Treatment: 1 Clustered Wound: No [name], [name] ([id number]) [id number] Page 6 of 6 Photos Wound Measurements Length: (cm) 4 Width: (cm) 2 Depth: (cm) 0.1 Area: (cm) 6.283 Volume: (cm) 0.628 % Reduction in Area: 11.1% % Reduction in Volume: 11.2% Epithelialization: None Tunneling: No Undermining: No Wound Description Classification: Full Thickness Without Exposed Support Structures Exudate Amount: Medium Exudate Type: Serosanguineous Exudate Color: red, brown Foul Odor After Cleansing: No Slough/Fibrino Yes Wound Bed Granulation Amount: Medium (34-66%) Exposed Structure Granulation Quality: Red, Pink Fat Layer (Subcutaneous Tissue) Exposed: Yes Necrotic Amount: Medium (34-66%) Necrotic Quality: Adherent Slough Periwound Skin Texture Texture Color No Abnormalities Noted: No No Abnormalities Noted: No Scarring: Yes Hemosiderin Staining: Yes Moisture Temperature / Pain No Abnormalities Noted: No Temperature: No Abnormality Dry / Scaly: No Maceration: No Electronic Signature(s) Signed: 08/14/2022 7:49:05 AM By: Brenton Grills Entered By: Brenton Grills on 07/10/2022 12:18:15 -------------------------------------------------------------------------------- Vitals Details Patient Name: Date of Service: [name], [name]. 07/10/2022 11:45 A M Medical Record Number: [id number] Patient Account Number: [id number] Date of Birth/Sex: Treating RN: 1929-03-09 (89 y.o. [name] Primary Care Chanell Nadeau: Creola Corn Other Clinician: Referring Mirai Greenwood: Treating Elanna Bert/Extender: Lacretia Leigh in Treatment: 1 Vital Signs Time Taken: 12:07 Temperature (F): 97.4 Height (in): 65 Pulse (bpm): 63 Weight (lbs): 110 Respiratory Rate (breaths/min): 18 Body Mass Index (BMI): 18.3  Blood Pressure  (mmHg): 132/71 Reference Range: 80 - 120 mg / dl Electronic Signature(s) Signed: 08/14/2022 7:49:05 AM By: Brenton Grills Entered By: Brenton Grills on 07/10/2022 12:11:03

## 2022-08-14 NOTE — Progress Notes (Signed)
CRIMSON, [name] [name] ([id number]) [id number] Page 1 of 4 Visit Report for 07/03/2022 Abuse Risk Screen Details Patient Name: Date of Service: [name], [name] [name] 07/03/2022 12:30 PM Medical Record Number: [id number] Patient Account Number: [id number] Date of Birth/Sex: Treating RN: August 19, 1929 (89 y.o. [name] Primary Care Verda Mehta: Creola Corn Other Clinician: Referring Kortne All: Treating Tyion Boylen/Extender: Octavia Bruckner in Treatment: 0 Abuse Risk Screen Items Answer ABUSE RISK SCREEN: Has anyone close to you tried to hurt or harm you recentlyo No Do you feel uncomfortable with anyone in your familyo No Has anyone forced you do things that you didnt want to doo No Electronic Signature(s) Signed: 08/14/2022 7:49:05 AM By: Brenton Grills Entered By: Brenton Grills on 07/03/2022 12:44:29 -------------------------------------------------------------------------------- Activities of Daily Living Details Patient Name: Date of Service: [name], [name] 07/03/2022 12:30 PM Medical Record Number: [id number] Patient Account Number: [id number] Date of Birth/Sex: Treating RN: 1929/06/24 (89 y.o. [name] Primary Care Krystalyn Kubota: Creola Corn Other Clinician: Referring Demoni Gergen: Treating Lashunta Frieden/Extender: Octavia Bruckner in Treatment: 0 Activities of Daily Living Items Answer Activities of Daily Living (Please select one for each item) Drive Automobile Not Able T Medications ake Completely Able Use T elephone Completely Able Care for Appearance Completely Able Use T oilet Completely Able Bath / Shower Completely Able Dress Self Completely Able Feed Self Completely Able Walk Completely Able Get In / Out Bed Completely Able Housework Completely Able Prepare Meals Completely Able Handle Money Completely Able Shop for Self Completely Able Electronic Signature(s) Signed: 08/14/2022 7:49:05 AM By: Brenton Grills Entered By: Brenton Grills on 07/03/2022 12:45:56 -------------------------------------------------------------------------------- Education Screening Details Patient Name: Date of Service: [name]. 07/03/2022 12:30 PM Medical Record Number: [id number] Patient Account Number: [id number] Date of Birth/Sex: Treating RN: 1929/06/25 (89 y.o. [name] Primary Care Nayden Czajka: Creola Corn Other Clinician: Referring Eydan Chianese: Treating Kameran Lallier/Extender: Octavia Bruckner in Treatment: 0 [place], [name] [name] ([id number]) [id number] Page 2 of 4 Learning Preferences/Education Level/Primary Language Highest Education Level: College or Above Preferred Language: Economist Language Barrier: No Translator Needed: No Memory Deficit: No Emotional Barrier: No Cultural/Religious Beliefs Affecting Medical Care: No Physical Barrier Impaired Vision: No Impaired Hearing: No Knowledge/Comprehension Knowledge Level: High Comprehension Level: High Ability to understand written instructions: High Ability to understand verbal instructions: High Motivation Anxiety Level: Calm Cooperation: Cooperative Education Importance: Acknowledges Need Interest in Health Problems: Asks Questions Perception: Coherent Willingness to Engage in Self-Management High Activities: Readiness to Engage in Self-Management High Activities: Electronic Signature(s) Signed: 08/14/2022 7:49:05 AM By: Brenton Grills Entered By: Brenton Grills on 07/03/2022 12:47:01 -------------------------------------------------------------------------------- Fall Risk Assessment Details Patient Name: Date of Service: [name], Barrett [name]. 07/03/2022 12:30 PM Medical Record Number: [id number] Patient Account Number: [id number] Date of Birth/Sex: Treating RN: Aug 25, 1929 (89 y.o. [name] Primary Care Trevonne Nyland: Creola Corn Other Clinician: Referring  Alonnah Lampkins: Treating Kainan Patty/Extender: Octavia Bruckner in Treatment: 0 Fall Risk Assessment Items Have you had 2 or more falls in the last 12 monthso 0 No Have you had any fall that resulted in injury in the last 12 monthso 0 Yes FALLS RISK SCREEN History of falling - immediate or within 3 months 0 No Secondary diagnosis (Do you have 2 or more medical diagnoseso) 0 No Ambulatory aid None/bed rest/wheelchair/nurse 0 No Crutches/cane/walker 0 No Furniture 0 No Intravenous therapy Access/Saline/Heparin Lock 0 No Gait/Transferring Normal/ bed rest/ wheelchair 0 No Weak (short steps with or without shuffle,  stooped but able to lift head while walking, may seek 0 No support from furniture) Impaired (short steps with shuffle, may have difficulty arising from chair, head down, impaired 0 No balance) Mental Status Oriented to own ability 0 No Electronic Signature(s) Signed: 08/14/2022 7:49:05 AM By: Velvet Bathe, [name] [name] ([id number]) [id number] Page 3 of 4 Entered By: Brenton Grills on 07/03/2022 12:47:48 -------------------------------------------------------------------------------- Foot Assessment Details Patient Name: Date of Service: [name], [name] 07/03/2022 12:30 PM Medical Record Number: [id number] Patient Account Number: [id number] Date of Birth/Sex: Treating RN: [birth year] (89 y.o. [name] [name] Primary Care Toshiro Hanken: Creola Corn Other Clinician: Referring Caitrin Pendergraph: Treating Kamaree Berkel/Extender: Octavia Bruckner in Treatment: 0 Foot Assessment Items Site Locations + = Sensation present, - = Sensation absent, C = Callus, U = Ulcer R = Redness, W = Warmth, M = Maceration, PU = Pre-ulcerative lesion F = Fissure, S = Swelling, D = Dryness Assessment Right: Left: Other Deformity: No No Prior Foot Ulcer: No No Prior Amputation: No No Charcot Joint: No No Ambulatory Status: Ambulatory Without  Help Gait: Steady Electronic Signature(s) Signed: 08/14/2022 7:49:05 AM By: Brenton Grills Entered By: Brenton Grills on 07/03/2022 12:52:43 -------------------------------------------------------------------------------- Nutrition Risk Screening Details Patient Name: Date of Service: [name], [name] 07/03/2022 12:30 PM Medical Record Number: [id number] Patient Account Number: [id number] Date of Birth/Sex: Treating RN: 1929-04-17 (89 y.o. [name] [name] Primary Care Tija Biss: Creola Corn Other Clinician: Referring Xeng Kucher: Treating Telesha Deguzman/Extender: Octavia Bruckner in Treatment: 0 Height (in): 65 Weight (lbs): 110 Body Mass Index (BMI): 18.3 SHALITA, NOTTE [name] ([id number]) [phone] [id number].pdf Page 4 of 4 Nutrition Risk Screening Items Score Screening NUTRITION RISK SCREEN: I have an illness or condition that made me change the kind and/or amount of food I eat 0 No I eat fewer than two meals per day 0 No I eat few fruits and vegetables, or milk products 0 No I have three or more drinks of beer, liquor or wine almost every day 0 No I have tooth or mouth problems that make it hard for me to eat 0 No I don't always have enough money to buy the food I need 0 No I eat alone most of the time 0 No I take three or more different prescribed or over-the-counter drugs a day 0 No Without wanting to, I have lost or gained 10 pounds in the last six months 0 No I am not always physically able to shop, cook and/or feed myself 0 No Nutrition Protocols Good Risk Protocol Moderate Risk Protocol High Risk Proctocol Risk Level: Good Risk Score: 0 Electronic Signature(s) Signed: 08/14/2022 7:49:05 AM By: Brenton Grills Entered By: Brenton Grills on 07/03/2022 12:47:55

## 2022-08-14 NOTE — Progress Notes (Signed)
JERSIE, TOSTO [name] ([id number]) [id number] Page 1 of 8 Visit Report for 07/03/2022 Allergy List Details Patient Name: Date of Service: [name] [name], [name] [name] 07/03/2022 12:30 PM Medical Record Number: [id number] Patient Account Number: [id number] Date of Birth/Sex: Treating RN: Jul 01, 1929 (89 y.o. [name] [name], [name] [name] Primary Care [name] [name]: [name] [name] Other Clinician: Referring [name] [name]/Extender: [name] [name] in Treatment: 0 Allergies Active Allergies penicillin tetanus toxoid,fluid Demerol clindamycin letrozole meningococcal vaccine C and Y conj-tetanus toxoid , moxifloxacin codeine Allergy Notes Electronic Signature(s) Signed: 08/14/2022 7:49:05 AM By: [name] [name] Entered By: [name] [name] on 07/03/2022 12:40:40 -------------------------------------------------------------------------------- Arrival Information Details Patient Name: Date of Service: [name] [name]. 07/03/2022 12:30 PM Medical Record Number: [id number] Patient Account Number: [id number] Date of Birth/Sex: Treating RN: May 02, 1929 (89 y.o. [name] [name] Primary Care [name] [name]: [name] [name] Other Clinician: Referring [name] [name]: Treating [name] [name]/Extender: [name] [name] in Treatment: 0 Visit Information Patient Arrived: [name] [name] Time: 12:32 Accompanied By: caretaker Transfer Assistance: None Patient Identification Verified: Yes Secondary Verification Process Completed: Yes Patient Requires Transmission-Based Precautions: No Patient Has Alerts: No Electronic Signature(s) Signed: 08/14/2022 7:49:05 AM By: [name] [name] Entered By: [name] [name] on 07/03/2022 12:33:46 -------------------------------------------------------------------------------- Clinic Level of Care Assessment Details Patient Name: Date of Service: [name] [name], [name] [name] 07/03/2022 12:30 PM Medical Record Number: [id number] Patient Account Number:  [id number] [name] [name], [name] [name] ([id number]) [id number] Page 2 of 8 Date of Birth/Sex: Treating RN: May 19, 1929 (89 y.o. [name] [name] Primary Care [name] [name]: Other Clinician: Creola [name] Referring [name] [name]: Treating [name] [name]/Extender: [name] [name] in Treatment: 0 Clinic Level of Care Assessment Items TOOL 1 Quantity Score X- 1 0 Use when EandM and Procedure is performed on INITIAL visit ASSESSMENTS - Nursing Assessment / Reassessment X- 1 20 General Physical Exam (combine w/ comprehensive assessment (listed just below) when performed on new pt. evals) X- 1 25 Comprehensive Assessment (HX, ROS, Risk Assessments, Wounds Hx, etc.) ASSESSMENTS - Wound and Skin Assessment / Reassessment X- 1 10 Dermatologic / Skin Assessment (not related to wound area) ASSESSMENTS - Ostomy and/or Continence Assessment and Care []  - 0 Incontinence Assessment and Management []  - 0 Ostomy Care Assessment and Management (repouching, etc.) PROCESS - Coordination of Care X - Simple Patient / Family Education for ongoing care 1 15 []  - 0 Complex (extensive) Patient / Family Education for ongoing care X- 1 10 Staff obtains Chiropractor, Records, T Results / Process Orders est []  - 0 Staff telephones HHA, Nursing Homes / Clarify orders / etc []  - 0 Routine Transfer to another Facility (non-emergent condition) []  - 0 Routine Hospital Admission (non-emergent condition) []  - 0 New Admissions / Manufacturing engineer / Ordering NPWT Apligraf, etc. , []  - 0 Emergency Hospital Admission (emergent condition) PROCESS - Special Needs []  - 0 Pediatric / Minor Patient Management []  - 0 Isolation Patient Management []  - 0 Hearing / Language / Visual special needs []  - 0 Assessment of Community assistance (transportation, D/C planning, etc.) []  - 0 Additional assistance / Altered mentation []  - 0 Support Surface(s) Assessment (bed, cushion, seat,  etc.) INTERVENTIONS - Miscellaneous []  - 0 External ear exam []  - 0 Patient Transfer (multiple staff / Nurse, adult / Similar devices) []  - 0 Simple Staple / Suture removal (25 or less) []  - 0 Complex Staple / Suture removal (26 or more) []  - 0 Hypo/Hyperglycemic Management (do not check if billed separately) X- 1 15 Ankle / Brachial Index (ABI) - do not check if billed separately  Has the patient been seen at the hospital within the last three years: Yes Total Score: 95 Level Of Care: New/Established - Level 3 Electronic Signature(s) Signed: 08/14/2022 7:49:05 AM By: [name] [name] Entered By: [name] [name] on 07/03/2022 15:59:48 -------------------------------------------------------------------------------- Compression Therapy Details Patient Name: Date of Service: [name] [name]. 07/03/2022 12:30 PM Medical Record Number: [id number] Patient Account Number: [id number] [name] [name], [name] [name] ([id number]) [id number] Page 3 of 8 Date of Birth/Sex: Treating RN: 1929/12/12 (89 y.o. [name] [name] Primary Care Takita Riecke: Other Clinician: Creola [name] Referring Kvion Shapley: Treating Jacquan Savas/Extender: [name] [name] in Treatment: 0 Compression Therapy Performed for Wound Assessment: Wound #1 Right,Lateral Lower Leg Performed By: Clinician [name] Grills, RN Compression Type: Three Layer Post Procedure Diagnosis Same as Pre-procedure Electronic Signature(s) Signed: 08/14/2022 7:49:05 AM By: [name] [name] Entered By: [name] [name] on 07/03/2022 13:29:23 -------------------------------------------------------------------------------- Encounter Discharge Information Details Patient Name: Date of Service: [name] [name]. 07/03/2022 12:30 PM Medical Record Number: [id number] Patient Account Number: [id number] Date of Birth/Sex: Treating RN: 02/29/1930 (89 y.o. [name] [name] Primary Care [name] [name]: [name] [name] Other Clinician: Referring  Eran Windish: Treating Aalijah Mims/Extender: [name] [name] in Treatment: 0 Encounter Discharge Information Items Post Procedure Vitals Discharge Condition: Stable Temperature (F): 98.1 Ambulatory Status: Ambulatory Pulse (bpm): 71 Discharge Destination: Home Respiratory Rate (breaths/min): 18 Transportation: Private Auto Blood Pressure (mmHg): 146/72 Accompanied By: caregiver Schedule Follow-up Appointment: Yes Clinical Summary of Care: Patient Declined Electronic Signature(s) Signed: 08/14/2022 7:49:05 AM By: [name] [name] Entered By: [name] [name] on 07/03/2022 13:56:26 -------------------------------------------------------------------------------- Lower Extremity Assessment Details Patient Name: Date of Service: [name] [name]. 07/03/2022 12:30 PM Medical Record Number: [id number] Patient Account Number: [id number] Date of Birth/Sex: Treating RN: [birth year] (89 y.o. [name] [name] Primary Care Renn Dirocco: [name] [name] Other Clinician: Referring Emmersen Garraway: Treating Cova Knieriem/Extender: [name] [name] in Treatment: 0 Edema Assessment Assessed: [name]: No] [Right: No] [Left: Edema] [Right: :] Calf Left: Right: Point of Measurement: From Medial Instep 33.5 cm Ankle Left: Right: Point of Measurement: From Medial Instep 22.7 cm Vascular Assessment Pulses: Dorsalis Pedis Palpable: [Right:Yes] Blood Pressure: Gaunt, Anavictoria [name] ([id number]) [Right:[id number] Page 4 of 8] Brachial: [Right:146] Ankle: [Right:Dorsalis Pedis: 142 0.97] Electronic Signature(s) Signed: 08/14/2022 7:49:05 AM By: [name] [name] Entered By: [name] [name] on 07/03/2022 13:22:02 -------------------------------------------------------------------------------- Multi Wound Chart Details Patient Name: Date of Service: [name] [name]. 07/03/2022 12:30 PM Medical Record Number: [id number] Patient Account Number: [id number] Date of Birth/Sex:  Treating RN: Jul 19, 1929 (89 y.o. F) Primary Care Ridgely Anastacio: [name] [name] Other Clinician: Referring Yehya Brendle: Treating Yordin Rhoda/Extender: [name] [name] in Treatment: 0 Vital Signs Height(in): 65 Pulse(bpm): 71 Weight(lbs): 110 Blood Pressure(mmHg): 146/64 Body Mass Index(BMI): 18.3 Temperature(F): 97.7 Respiratory Rate(breaths/min): 18 [1:Photos:] [N/A:N/A] Right, Lateral Lower Leg N/A N/A Wound Location: Skin Tear/Laceration N/A N/A Wounding Event: Abrasion N/A N/A Primary Etiology: Arrhythmia, Coronary Artery Disease, N/A N/A Comorbid History: Hypertension, Osteoarthritis 06/19/2022 N/A N/A Date Acquired: 0 N/A N/A Weeks of Treatment: Open N/A N/A Wound Status: No N/A N/A Wound Recurrence: 4.5x2x0.1 N/A N/A Measurements L x W x D (cm) 7.069 N/A N/A A (cm) : rea 0.707 N/A N/A Volume (cm) : Full Thickness Without Exposed N/A N/A Classification: Support Structures Medium N/A N/A Exudate A mount: Serosanguineous N/A N/A Exudate Type: red, brown N/A N/A Exudate Color: Medium (34-66%) N/A N/A Granulation A mount: Red, Pink N/A N/A Granulation Quality: Medium (34-66%) N/A N/A Necrotic A mount: Fat Layer (Subcutaneous Tissue): Yes N/A N/A Exposed Structures: None N/A N/A  Epithelialization: Debridement - Excisional N/A N/A Debridement: Pre-procedure Verification/Time Out 13:25 N/A N/A Taken: Lidocaine 4% Topical Solution N/A N/A Pain Control: Subcutaneous, Slough N/A N/A Tissue Debrided: Skin/Subcutaneous Tissue N/A N/A Level: 7.06 N/A N/A Debridement A (sq cm): rea Curette N/A N/A Instrument: Minimum N/A N/A Bleeding: Pressure N/A N/A Hemostasis A chieved: 0 N/A N/A Procedural Pain: 0 N/A N/A Post Procedural Pain: Procedure was tolerated well N/A N/A Debridement Treatment Response: 4.5x2x0.1 N/A N/A Post Debridement Measurements L x W x D (cm) 0.707 N/A N/A Post Debridement Volume: (cm) Scarring: Yes N/A  N/A Periwound Skin Texture: Maceration: No N/A N/A [address], [name] [name] ([id number]) [id number] Page 5 of 8 Dry/Scaly: No Hemosiderin Staining: Yes N/A N/A Periwound Skin Color: No Abnormality N/A N/A Temperature: Compression Therapy N/A N/A Procedures Performed: Debridement Treatment Notes Electronic Signature(s) Signed: 07/03/2022 2:20:58 PM By: Geralyn Corwin DO Entered By: Geralyn Corwin on 07/03/2022 13:35:20 -------------------------------------------------------------------------------- Multi-Disciplinary Care Plan Details Patient Name: Date of Service: [name] [name]. 07/03/2022 12:30 PM Medical Record Number: [id number] Patient Account Number: [id number] Date of Birth/Sex: Treating RN: March 17, 1929 (89 y.o. [name] [name] Primary Care Angelis Gates: [name] [name] Other Clinician: Referring Clester Chlebowski: Treating Symphani Eckstrom/Extender: [name] [name] in Treatment: 0 Active Inactive Wound/Skin Impairment Nursing Diagnoses: Impaired tissue integrity Goals: Patient/caregiver will verbalize understanding of skin care regimen Date Initiated: 07/03/2022 Target Resolution Date: 08/24/2022 Goal Status: Active Interventions: Assess patient/caregiver ability to obtain necessary supplies Assess patient/caregiver ability to perform ulcer/skin care regimen upon admission and as needed Assess ulceration(s) every visit Provide education on ulcer and skin care Screen for HBO Treatment Activities: Skin care regimen initiated : 07/03/2022 Topical wound management initiated : 07/03/2022 Notes: Electronic Signature(s) Signed: 08/14/2022 7:49:05 AM By: [name] [name] Entered By: [name] [name] on 07/03/2022 13:06:01 -------------------------------------------------------------------------------- Pain Assessment Details Patient Name: Date of Service: [name] [name]. 07/03/2022 12:30 PM Medical Record Number: [id number] Patient  Account Number: [id number] Date of Birth/Sex: Treating RN: 04-01-1929 (89 y.o. [name] [name] Primary Care Catriona Dillenbeck: [name] [name] Other Clinician: Referring Issaih Kaus: Treating Ludmila Ebarb/Extender: [name] [name] in Treatment: 0 Active Problems Location of Pain Severity and Description of Pain Patient Has Paino No Site Locations [name] [name], [name] [name] ([id number]) [id number] Page 6 of 8 Pain Management and Medication Current Pain Management: Electronic Signature(s) Signed: 08/14/2022 7:49:05 AM By: [name] [name] Entered By: [name] [name] on 07/03/2022 13:04:35 -------------------------------------------------------------------------------- Patient/Caregiver Education Details Patient Name: Date of Service: [name] [name] 4/30/2024andnbsp12:30 PM Medical Record Number: [id number] Patient Account Number: [id number] Date of Birth/Gender: Treating RN: 1929-06-03 (89 y.o. [name] [name] Primary Care Physician: [name] [name] Other Clinician: Referring Physician: Treating Physician/Extender: [name] [name] in Treatment: 0 Education Assessment Education Provided To: Patient and Caregiver Education Topics Provided Wound/Skin Impairment: Methods: Explain/Verbal Responses: State content correctly Electronic Signature(s) Signed: 08/14/2022 7:49:05 AM By: [name] [name] Entered By: [name] [name] on 07/03/2022 13:06:25 -------------------------------------------------------------------------------- Wound Assessment Details Patient Name: Date of Service: [name] [name]. 07/03/2022 12:30 PM Medical Record Number: [id number] Patient Account Number: [id number] Date of Birth/Sex: Treating RN: Mar 17, 1929 (89 y.o. [name] [name] Primary Care Sigmond Patalano: [name] [name] Other Clinician: Referring Quetzalli Clos: Treating Lusia Greis/Extender: [name] [name] in Treatment: 0 Wound Status Wound Number: 1 Primary  Abrasion Etiology: Wound Location: Right, Lateral Lower Leg Wound Status: Open Wounding Event: Skin Tear/Laceration Comorbid Arrhythmia, Coronary Artery Disease, Hypertension, Date Acquired: 06/19/2022 History: Osteoarthritis Weeks Of Treatment: 0 Clustered Wound: No [name] [name], [name] [name] ([id number]) [id number] Page 7  of 8 Photos Wound Measurements Length: (cm) 4.5 Width: (cm) 2 Depth: (cm) 0.1 Area: (cm) 7.069 Volume: (cm) 0.707 % Reduction in Area: % Reduction in Volume: Epithelialization: None Tunneling: No Undermining: No Wound Description Classification: Full Thickness Without Exposed Support Structures Exudate Amount: Medium Exudate Type: Serosanguineous Exudate Color: red, brown Foul Odor After Cleansing: No Slough/Fibrino Yes Wound Bed Granulation Amount: Medium (34-66%) Exposed Structure Granulation Quality: Red, Pink Fat Layer (Subcutaneous Tissue) Exposed: Yes Necrotic Amount: Medium (34-66%) Necrotic Quality: Adherent Slough Periwound Skin Texture Texture Color No Abnormalities Noted: No No Abnormalities Noted: No Scarring: Yes Hemosiderin Staining: Yes Moisture Temperature / Pain No Abnormalities Noted: No Temperature: No Abnormality Dry / Scaly: No Maceration: No Electronic Signature(s) Signed: 08/14/2022 7:49:05 AM By: [name] [name] Entered By: [name] [name] on 07/03/2022 13:03:59 -------------------------------------------------------------------------------- Vitals Details Patient Name: Date of Service: [name] [name]. 07/03/2022 12:30 PM Medical Record Number: [id number] Patient Account Number: [id number] Date of Birth/Sex: Treating RN: Sep 27, 1929 (89 y.o. [name] [name] Primary Care Rosanne Wohlfarth: [name] [name] Other Clinician: Referring Huda Petrey: Treating Zeven Kocak/Extender: [name] [name] in Treatment: 0 Vital Signs Time Taken: 12:33 Temperature (F): 97.7 Height (in): 65 Pulse (bpm): 71 Source:  Stated Respiratory Rate (breaths/min): 18 Weight (lbs): 110 Blood Pressure (mmHg): 146/64 Source: Stated Reference Range: 80 - 120 mg / dl Body Mass Index (BMI): 18.3 Electronic Signature(s) Signed: 08/14/2022 7:49:05 AM By: [name] [name] Entered By: [name] [name] on 07/03/2022 12:40:33 Essick, Gerald Leitz ([id number]) [id number] Page 8 of 8

## 2022-08-20 ENCOUNTER — Ambulatory Visit (HOSPITAL_COMMUNITY): Payer: Medicare PPO | Attending: Nurse Practitioner

## 2022-08-20 DIAGNOSIS — I350 Nonrheumatic aortic (valve) stenosis: Secondary | ICD-10-CM | POA: Insufficient documentation

## 2022-08-20 LAB — ECHOCARDIOGRAM COMPLETE
AR max vel: 2.17 cm2
AV Area VTI: 2.3 cm2
AV Area mean vel: 2.16 cm2
AV Mean grad: 8 mmHg
AV Peak grad: 14.6 mmHg
Ao pk vel: 1.91 m/s
Area-P 1/2: 3.37 cm2
MV M vel: 5.8 m/s
MV Peak grad: 134.6 mmHg
S' Lateral: 2.6 cm

## 2022-08-24 ENCOUNTER — Telehealth: Payer: Self-pay

## 2022-08-24 DIAGNOSIS — H04123 Dry eye syndrome of bilateral lacrimal glands: Secondary | ICD-10-CM | POA: Diagnosis not present

## 2022-08-24 NOTE — Telephone Encounter (Signed)
Spoke with pt. Pt was notified of echo results. Pt will continue current medication and f/u as planned.  

## 2022-08-27 DIAGNOSIS — M7062 Trochanteric bursitis, left hip: Secondary | ICD-10-CM | POA: Diagnosis not present

## 2022-08-31 DIAGNOSIS — M545 Low back pain, unspecified: Secondary | ICD-10-CM | POA: Diagnosis not present

## 2022-08-31 DIAGNOSIS — M810 Age-related osteoporosis without current pathological fracture: Secondary | ICD-10-CM | POA: Diagnosis not present

## 2022-08-31 DIAGNOSIS — H9193 Unspecified hearing loss, bilateral: Secondary | ICD-10-CM | POA: Diagnosis not present

## 2022-08-31 DIAGNOSIS — E039 Hypothyroidism, unspecified: Secondary | ICD-10-CM | POA: Diagnosis not present

## 2022-08-31 DIAGNOSIS — K219 Gastro-esophageal reflux disease without esophagitis: Secondary | ICD-10-CM | POA: Diagnosis not present

## 2022-08-31 DIAGNOSIS — I272 Pulmonary hypertension, unspecified: Secondary | ICD-10-CM | POA: Diagnosis not present

## 2022-08-31 DIAGNOSIS — Z87892 Personal history of anaphylaxis: Secondary | ICD-10-CM | POA: Diagnosis not present

## 2022-08-31 DIAGNOSIS — I130 Hypertensive heart and chronic kidney disease with heart failure and stage 1 through stage 4 chronic kidney disease, or unspecified chronic kidney disease: Secondary | ICD-10-CM | POA: Diagnosis not present

## 2022-08-31 DIAGNOSIS — I495 Sick sinus syndrome: Secondary | ICD-10-CM | POA: Diagnosis not present

## 2022-08-31 DIAGNOSIS — E785 Hyperlipidemia, unspecified: Secondary | ICD-10-CM | POA: Diagnosis not present

## 2022-08-31 DIAGNOSIS — M1990 Unspecified osteoarthritis, unspecified site: Secondary | ICD-10-CM | POA: Diagnosis not present

## 2022-08-31 DIAGNOSIS — Z87891 Personal history of nicotine dependence: Secondary | ICD-10-CM | POA: Diagnosis not present

## 2022-08-31 DIAGNOSIS — R32 Unspecified urinary incontinence: Secondary | ICD-10-CM | POA: Diagnosis not present

## 2022-08-31 DIAGNOSIS — J309 Allergic rhinitis, unspecified: Secondary | ICD-10-CM | POA: Diagnosis not present

## 2022-08-31 DIAGNOSIS — I2510 Atherosclerotic heart disease of native coronary artery without angina pectoris: Secondary | ICD-10-CM | POA: Diagnosis not present

## 2022-08-31 DIAGNOSIS — I700 Atherosclerosis of aorta: Secondary | ICD-10-CM | POA: Diagnosis not present

## 2022-08-31 DIAGNOSIS — H3530 Unspecified macular degeneration: Secondary | ICD-10-CM | POA: Diagnosis not present

## 2022-08-31 DIAGNOSIS — Z7901 Long term (current) use of anticoagulants: Secondary | ICD-10-CM | POA: Diagnosis not present

## 2022-09-03 DIAGNOSIS — Z7901 Long term (current) use of anticoagulants: Secondary | ICD-10-CM | POA: Diagnosis not present

## 2022-09-03 DIAGNOSIS — M7062 Trochanteric bursitis, left hip: Secondary | ICD-10-CM | POA: Diagnosis not present

## 2022-09-03 DIAGNOSIS — Z79899 Other long term (current) drug therapy: Secondary | ICD-10-CM | POA: Diagnosis not present

## 2022-09-03 DIAGNOSIS — Z950 Presence of cardiac pacemaker: Secondary | ICD-10-CM | POA: Diagnosis not present

## 2022-09-03 DIAGNOSIS — M1712 Unilateral primary osteoarthritis, left knee: Secondary | ICD-10-CM | POA: Diagnosis not present

## 2022-09-03 DIAGNOSIS — I4891 Unspecified atrial fibrillation: Secondary | ICD-10-CM | POA: Diagnosis not present

## 2022-09-03 DIAGNOSIS — I4892 Unspecified atrial flutter: Secondary | ICD-10-CM | POA: Diagnosis not present

## 2022-09-03 DIAGNOSIS — E038 Other specified hypothyroidism: Secondary | ICD-10-CM | POA: Diagnosis not present

## 2022-09-03 DIAGNOSIS — I10 Essential (primary) hypertension: Secondary | ICD-10-CM | POA: Diagnosis not present

## 2022-09-03 NOTE — Progress Notes (Signed)
PRANISHA, CHANNEL ([id number]) [id number] Page 1 of 10 Visit Report for 07/03/2022 Chief Complaint Document Details Patient Name: Date of Service: [name] [name] 07/03/2022 12:30 PM Medical Record Number: [id number] Patient Account Number: [id number] Date of Birth/Sex: Treating RN: [birth year] (89 y.o. F) Primary Care Provider: Creola Corn Other Clinician: Referring Provider: Treating Provider/Extender: Octavia Bruckner in Treatment: 0 Information Obtained from: Patient Chief Complaint 07/03/2022; right lower extremity wound Electronic Signature(s) Signed: 07/03/2022 2:20:58 PM By: Geralyn Corwin DO Entered By: Geralyn Corwin on 07/03/2022 13:35:48 -------------------------------------------------------------------------------- Debridement Details Patient Name: Date of Service: [name] [name]. 07/03/2022 12:30 PM Medical Record Number: [id number] Patient Account Number: [id number] Date of Birth/Sex: Treating RN: 1929/05/06 (89 y.o. [name] Primary Care Provider: Creola Corn Other Clinician: Referring Provider: Treating Provider/Extender: Octavia Bruckner in Treatment: 0 Debridement Performed for Assessment: Wound #1 Right,Lateral Lower Leg Performed By: Physician Geralyn Corwin, DO Debridement Type: Debridement Level of Consciousness (Pre-procedure): Awake and Alert Pre-procedure Verification/Time Out Yes - 13:25 Taken: Start Time: 13:26 Pain Control: Lidocaine 4% T opical Solution Percent of Wound Bed Debrided: 100% T Area Debrided (cm): otal 7.06 Tissue and other material debrided: Viable, Non-Viable, Slough, Subcutaneous, Slough Level: Skin/Subcutaneous Tissue Debridement Description: Excisional Instrument: Curette Bleeding: Minimum Hemostasis Achieved: Pressure End Time: 13:29 Procedural Pain: 0 Post Procedural Pain: 0 Response to Treatment: Procedure was tolerated well Level of  Consciousness (Post- Awake and Alert procedure): Post Debridement Measurements of Total Wound Length: (cm) 4.5 Width: (cm) 2 Depth: (cm) 0.1 Volume: (cm) 0.707 Character of Wound/Ulcer Post DebridementTEARRIA, MCDANIEL [name] ([id number]) [id number] Page 2 of 10 Post Procedure Diagnosis Same as Pre-procedure Notes Scribed for Dr Mikey Bussing by Brenton Grills RN. Electronic Signature(s) Signed: 07/03/2022 2:20:58 PM By: Geralyn Corwin DO Signed: 08/14/2022 7:49:05 AM By: Brenton Grills Entered By: Brenton Grills on 07/03/2022 13:28:54 -------------------------------------------------------------------------------- HPI Details Patient Name: Date of Service: [name] [name]. 07/03/2022 12:30 PM Medical Record Number: [id number] Patient Account Number: [id number] Date of Birth/Sex: Treating RN: 03/29/1929 (89 y.o. F) Primary Care Provider: Creola Corn Other Clinician: Referring Provider: Treating Provider/Extender: Octavia Bruckner in Treatment: 0 History of Present Illness HPI Description: 07/03/2022 [name] [name] is a 89 year old female with a past medical history of left breast cancer, severe aortic stenosis status post TAVR, paroxysmal A-fib on Eliquis and venous insufficiency that presents to the clinic for a 2-week history of nonhealing wound to the right anterior leg. She states she hit her leg against a car door. She has been using mupirocin ointment to the wound bed and she has been prescribed doxycycline. She is currently taking this. She currently denies systemic signs of infection. She has compression stockings but does not wear them. Electronic Signature(s) Signed: 07/03/2022 2:20:58 PM By: Geralyn Corwin DO Entered By: Geralyn Corwin on 07/03/2022 13:37:42 -------------------------------------------------------------------------------- Physical Exam Details Patient Name: Date of Service: [name] [name] [place]. 07/03/2022 12:30  PM Medical Record Number: [id number] Patient Account Number: [id number] Date of Birth/Sex: Treating RN: March 17, 1929 (89 y.o. F) Primary Care Provider: Creola Corn Other Clinician: Referring Provider: Treating Provider/Extender: Octavia Bruckner in Treatment: 0 Constitutional respirations regular, non-labored and within target range for patient.. Cardiovascular 2+ dorsalis pedis/posterior tibialis pulses. Psychiatric pleasant and cooperative. Notes Right lower extremity: T the anterior aspect there is an open wound with nonviable tissue and granulation tissue. No surrounding signs of infection including o increased warmth, erythema or purulent drainage. 2+ pitting edema to the knee.  Venous stasis dermatitis with hemosiderin staining noted. Electronic Signature(s) Signed: 07/03/2022 2:20:58 PM By: Geralyn Corwin DO [name] DanaSigned: 07/03/2022 2:20:58 PM By: Jerre Simon ([id number]) [id number] Page 3 of 10 Entered By: Geralyn Corwin on 07/03/2022 13:38:36 -------------------------------------------------------------------------------- Physician Orders Details Patient Name: Date of Service: [name] [name] [name] [name] 07/03/2022 12:30 PM Medical Record Number: [id number] Patient Account Number: [id number] Date of Birth/Sex: Treating RN: 1929-08-03 (89 y.o. [name] Primary Care Provider: Creola Corn Other Clinician: Referring Provider: Treating Provider/Extender: Octavia Bruckner in Treatment: 0 Verbal / Phone Orders: No Diagnosis Coding ICD-10 Coding Code Description [phone] Non-pressure chronic ulcer of other part of right lower leg with fat layer exposed I87.311 Chronic venous hypertension (idiopathic) with ulcer of right lower extremity T79.8XXA Other early complications of trauma, initial encounter Z95.2 Presence of prosthetic heart valve I48.0 Paroxysmal atrial fibrillation Z79.01 Long term  (current) use of anticoagulants C50.412 Malignant neoplasm of upper-outer quadrant of left female breast Follow-up Appointments ppointment in 1 week. - Dr. Mikey Bussing Return A Anesthetic (In clinic) Topical Lidocaine 4% applied to wound bed Bathing/ Shower/ Hygiene May shower with protection but do not get wound dressing(s) wet. Protect dressing(s) with water repellant cover (for example, large plastic bag) or a cast cover and may then take shower. Edema Control - Lymphedema / SCD / Other Left Lower Extremity Elevate legs to the level of the heart or above for 30 minutes daily and/or when sitting for 3-4 times a day throughout the day. A void standing for long periods of time. Compression stocking or Garment 30-40 mm/Hg pressure to: If compression wraps slide down please call wound center and speak with a nurse. Wound Treatment Wound #1 - Lower Leg Wound Laterality: Right, Lateral Cleanser: Vashe 5.8 (oz) Discharge Instructions: Cleanse the wound with Vashe prior to applying a clean dressing using gauze sponges, not tissue or cotton balls. Topical: Mupirocin Ointment Discharge Instructions: Apply Mupirocin (Bactroban) as instructed Prim Dressing: Hydrofera Blue Ready Transfer Foam, 4x5 (in/in) ary Discharge Instructions: Apply to wound bed as instructed Secondary Dressing: Zetuvit Plus Silicone Border Dressing 5x5 (in/in) Discharge Instructions: Apply silicone border over primary dressing as directed. Secured With: Paper Tape, 1x10 (in/yd) Discharge Instructions: Secure dressing with tape as directed. Compression Wrap: Urgo K2, two layer compression system, regular Discharge Instructions: Apply Urgo K2 as directed (alternative to 4 layer compression). Electronic Signature(s) Signed: 07/03/2022 2:20:58 PM By: Geralyn Corwin DO Entered By: Geralyn Corwin on 07/03/2022 13:38:46 Swartzlander, Gerald Leitz ([id number]) [id number] Page 4 of  10 -------------------------------------------------------------------------------- Problem List Details Patient Name: Date of Service: [name], [name] 07/03/2022 12:30 PM Medical Record Number: [id number] Patient Account Number: [id number] Date of Birth/Sex: Treating RN: [birth year] (89 y.o. F) Primary Care Provider: Creola Corn Other Clinician: Referring Provider: Treating Provider/Extender: Octavia Bruckner in Treatment: 0 Active Problems ICD-10 Encounter Code Description Active Date MDM Diagnosis L97.812 Non-pressure chronic ulcer of other part of right lower leg with fat layer 07/03/2022 No Yes exposed I87.311 Chronic venous hypertension (idiopathic) with ulcer of right lower extremity 07/03/2022 No Yes T79.8XXA Other early complications of trauma, initial encounter 07/03/2022 No Yes Z95.2 Presence of prosthetic heart valve 07/03/2022 No Yes I48.0 Paroxysmal atrial fibrillation 07/03/2022 No Yes Z79.01 Long term (current) use of anticoagulants 07/03/2022 No Yes C50.412 Malignant neoplasm of upper-outer quadrant of left female breast 07/03/2022 No Yes Inactive Problems Resolved Problems Electronic Signature(s) Signed: 07/03/2022 2:20:58 PM By: Geralyn Corwin DO Entered By: Geralyn Corwin on 07/03/2022 13:35:13 -------------------------------------------------------------------------------- Progress  Note Details Patient Name: Date of Service: [name], [name] 07/03/2022 12:30 PM Medical Record Number: [id number] Patient Account Number: [id number] Date of Birth/Sex: Treating RN: [birth year] (89 y.o. F) Primary Care Provider: Creola Corn Other Clinician: [name] ([id number]) [id number] Page 5 of 10 Referring Provider: Treating Provider/Extender: Octavia Bruckner in Treatment: 0 Subjective Chief Complaint Information obtained from Patient 07/03/2022; right lower extremity wound History of Present Illness  (HPI) 07/03/2022 Ms. [name] [name] is a 89 year old female with a past medical history of left breast cancer, severe aortic stenosis status post TAVR, paroxysmal A-fib on Eliquis and venous insufficiency that presents to the clinic for a 2-week history of nonhealing wound to the right anterior leg. She states she hit her leg against a car door. She has been using mupirocin ointment to the wound bed and she has been prescribed doxycycline. She is currently taking this. She currently denies systemic signs of infection. She has compression stockings but does not wear them. Patient History Information obtained from Patient, Chart. Allergies penicillin, tetanus toxoid,fluid, Demerol, clindamycin, letrozole, meningococcal vaccine C and Y, conj-tetanus toxoid, moxifloxacin, codeine Family History Cancer - Father, No family history of Diabetes, Heart Disease, Hereditary Spherocytosis, Hypertension, Kidney Disease, Lung Disease, Seizures, Stroke. Social History Never smoker, Marital Status - Widowed, Alcohol Use - Never, Drug Use - No History, Caffeine Use - Never. Medical History Cardiovascular Patient has history of Arrhythmia - Paroxysmal Afib, Coronary Artery Disease, Hypertension Denies history of Congestive Heart Failure, Hypotension, Myocardial Infarction, Peripheral Arterial Disease, Peripheral Venous Disease, Phlebitis, Vasculitis Endocrine Denies history of Type I Diabetes, Type II Diabetes Integumentary (Skin) Denies history of History of Burn Musculoskeletal Patient has history of Osteoarthritis Denies history of Gout, Rheumatoid Arthritis, Osteomyelitis Hospitalization/Surgery History - breast lumpectomy with radioactive seed localization. - intramedullary nail intertrochanteric (left). - transcatheter aortic valve replacement. - coronary stent intervention. - coronary angioplasty with stent placement. - abdominal hysterectomy. - basal cell carcinoma excision. - cataract extraction w/  intraocular lens implant-bilateral. - finger surgery. - squamous cell carcinoma excision. Medical A Surgical History Notes nd Cardiovascular transcatheter aortic valve replacement; hyperlipidemia; presence of permanent cardiac pacemaker Review of Systems (ROS) Constitutional Symptoms (General Health) Denies complaints or symptoms of Fatigue, Fever, Chills, Marked Weight Change. Eyes Denies complaints or symptoms of Dry Eyes, Vision Changes, Glasses / Contacts. Ear/Nose/Mouth/Throat Denies complaints or symptoms of Chronic sinus problems or rhinitis. Respiratory Denies complaints or symptoms of Chronic or frequent coughs, Shortness of Breath. Gastrointestinal GERD Endocrine Denies complaints or symptoms of Heat/cold intolerance, hypothyroidism Genitourinary chronic kidney disease Integumentary (Skin) Complains or has symptoms of Wounds - right lateral leg. Musculoskeletal Denies complaints or symptoms of Muscle Pain, Muscle Weakness. Neurologic Denies complaints or symptoms of Numbness/parasthesias. Oncologic family hx of breast cancer; personal hx of skin cancer Psychiatric Denies complaints or symptoms of Claustrophobia. Objective Constitutional respirations regular, non-labored and within target range for patient.Marland Kitchen [name], [name] ([id number]) [id number] Page 6 of 10 Vitals Time Taken: 12:33 PM, Height: 65 in, Source: Stated, Weight: 110 lbs, Source: Stated, BMI: 18.3, Temperature: 97.7 F, Pulse: 71 bpm, Respiratory Rate: 18 breaths/min, Blood Pressure: 146/64 mmHg. Cardiovascular 2+ dorsalis pedis/posterior tibialis pulses. Psychiatric pleasant and cooperative. General Notes: Right lower extremity: T the anterior aspect there is an open wound with nonviable tissue and granulation tissue. No surrounding signs of o infection including increased warmth, erythema or purulent drainage. 2+ pitting edema to the knee. Venous stasis dermatitis with  hemosiderin staining noted. Integumentary (Hair, Skin) Wound #1  status is Open. Original cause of wound was Skin T ear/Laceration. The date acquired was: 06/19/2022. The wound is located on the Right,Lateral Lower Leg. The wound measures 4.5cm length x 2cm width x 0.1cm depth; 7.069cm^2 area and 0.707cm^3 volume. There is Fat Layer (Subcutaneous Tissue) exposed. There is no tunneling or undermining noted. There is a medium amount of serosanguineous drainage noted. There is medium (34-66%) red, pink granulation within the wound bed. There is a medium (34-66%) amount of necrotic tissue within the wound bed including Adherent Slough. The periwound skin appearance exhibited: Scarring, Hemosiderin Staining. The periwound skin appearance did not exhibit: Dry/Scaly, Maceration. Periwound temperature was noted as No Abnormality. Assessment Active Problems ICD-10 Non-pressure chronic ulcer of other part of right lower leg with fat layer exposed Chronic venous hypertension (idiopathic) with ulcer of right lower extremity Other early complications of trauma, initial encounter Presence of prosthetic heart valve Paroxysmal atrial fibrillation Long term (current) use of anticoagulants Malignant neoplasm of upper-outer quadrant of left female breast Patient presents with a 2-week history of nonhealing wound to the anterior right leg secondary to trauma and nonhealing due to venous insufficiency. Her ABIs were 0.97 on the right and she has palpable pedal pulses suggesting adequate blood flow for healing. I debrided nonviable tissue. No surrounding signs of infection. I recommended antibiotic ointment and Hydrofera Blue under compression therapy. We discussed the importance of edema control for her wound healing. She is agreeable to an in office wrap. She knows to not keep this on for more than 7 days or get this wet. She knows to call with any questions or concerns. Procedures Wound #1 Pre-procedure  diagnosis of Wound #1 is an Abrasion located on the Right,Lateral Lower Leg . There was a Excisional Skin/Subcutaneous Tissue Debridement with a total area of 7.06 sq cm performed by Geralyn Corwin, DO. With the following instrument(s): Curette to remove Viable and Non-Viable tissue/material. Material removed includes Subcutaneous Tissue and Slough and after achieving pain control using Lidocaine 4% T opical Solution. No specimens were taken. A time out was conducted at 13:25, prior to the start of the procedure. A Minimum amount of bleeding was controlled with Pressure. The procedure was tolerated well with a pain level of 0 throughout and a pain level of 0 following the procedure. Post Debridement Measurements: 4.5cm length x 2cm width x 0.1cm depth; 0.707cm^3 volume. Character of Wound/Ulcer Post Debridement is stable. Post procedure Diagnosis Wound #1: Same as Pre-Procedure General Notes: Scribed for Dr Mikey Bussing by Brenton Grills RN.. Pre-procedure diagnosis of Wound #1 is an Abrasion located on the Right,Lateral Lower Leg . There was a Three Layer Compression Therapy Procedure by Brenton Grills, RN. Post procedure Diagnosis Wound #1: Same as Pre-Procedure Plan Follow-up Appointments: Return Appointment in 1 week. - Dr. Mikey Bussing Anesthetic: (In clinic) Topical Lidocaine 4% applied to wound bed Bathing/ Shower/ Hygiene: May shower with protection but do not get wound dressing(s) wet. Protect dressing(s) with water repellant cover (for example, large plastic bag) or a cast cover and may then take shower. Edema Control - Lymphedema / SCD / Other: Elevate legs to the level of the heart or above for 30 minutes daily and/or when sitting for 3-4 times a day throughout the day. Avoid standing for long periods of time. Compression stocking or Garment 30-40 mm/Hg pressure to: If compression wraps slide down please call wound center and speak with a nurse. WOUND #1: - Lower Leg Wound Laterality:  Right, Lateral [name], [name] [name] ([id number]) [id number] Page  7 of 10 Cleanser: Vashe 5.8 (oz) Discharge Instructions: Cleanse the wound with Vashe prior to applying a clean dressing using gauze sponges, not tissue or cotton balls. Topical: Mupirocin Ointment Discharge Instructions: Apply Mupirocin (Bactroban) as instructed Prim Dressing: Hydrofera Blue Ready Transfer Foam, 4x5 (in/in) ary Discharge Instructions: Apply to wound bed as instructed Secondary Dressing: Zetuvit Plus Silicone Border Dressing 5x5 (in/in) Discharge Instructions: Apply silicone border over primary dressing as directed. Secured With: Paper T ape, 1x10 (in/yd) Discharge Instructions: Secure dressing with tape as directed. Com pression Wrap: Urgo K2, two layer compression system, regular Discharge Instructions: Apply Urgo K2 as directed (alternative to 4 layer compression). 1. In office sharp debridement 2. Antibiotic ointment with Hydrofera Blue under 3 layer compressionright lower extremity 3. Follow-up in 1 week Electronic Signature(s) Signed: 09/03/2022 1:47:10 PM By: Pearletha Alfred Signed: 09/03/2022 2:57:08 PM By: Geralyn Corwin DO Previous Signature: 07/19/2022 3:22:36 PM Version By: Pearletha Alfred Previous Signature: 07/19/2022 4:22:18 PM Version By: Geralyn Corwin DO Previous Signature: 07/03/2022 2:20:58 PM Version By: Geralyn Corwin DO Entered By: Pearletha Alfred on 09/03/2022 13:47:10 -------------------------------------------------------------------------------- HxROS Details Patient Name: Date of Service: [name], [name]. 07/03/2022 12:30 PM Medical Record Number: [id number] Patient Account Number: [id number] Date of Birth/Sex: Treating RN: Oct 24, 1929 (89 y.o. [name], Lauren Primary Care Provider: Creola Corn Other Clinician: Referring Provider: Treating Provider/Extender: Octavia Bruckner in Treatment: 0 Information Obtained From Patient  Chart Constitutional Symptoms (General Health) Complaints and Symptoms: Negative for: Fatigue; Fever; Chills; Marked Weight Change Eyes Complaints and Symptoms: Negative for: Dry Eyes; Vision Changes; Glasses / Contacts Ear/Nose/Mouth/Throat Complaints and Symptoms: Negative for: Chronic sinus problems or rhinitis Respiratory Complaints and Symptoms: Negative for: Chronic or frequent coughs; Shortness of Breath Endocrine Complaints and Symptoms: Negative for: Heat/cold intolerance Review of System Notes: hypothyroidism Medical History: Negative for: Type I Diabetes; Type II Diabetes Integumentary (Skin) Complaints and Symptoms: [name], [name] ([id number]) [id number] Page 8 of 10 Positive for: Wounds - right lateral leg Medical History: Negative for: History of Burn Musculoskeletal Complaints and Symptoms: Negative for: Muscle Pain; Muscle Weakness Medical History: Positive for: Osteoarthritis Negative for: Gout; Rheumatoid Arthritis; Osteomyelitis Neurologic Complaints and Symptoms: Negative for: Numbness/parasthesias Psychiatric Complaints and Symptoms: Negative for: Claustrophobia Hematologic/Lymphatic Cardiovascular Medical History: Positive for: Arrhythmia - Paroxysmal Afib; Coronary Artery Disease; Hypertension Negative for: Congestive Heart Failure; Hypotension; Myocardial Infarction; Peripheral Arterial Disease; Peripheral Venous Disease; Phlebitis; Vasculitis Past Medical History Notes: transcatheter aortic valve replacement; hyperlipidemia; presence of permanent cardiac pacemaker Gastrointestinal Complaints and Symptoms: Review of System Notes: GERD Genitourinary Complaints and Symptoms: Review of System Notes: chronic kidney disease Immunological Oncologic Complaints and Symptoms: Review of System Notes: family hx of breast cancer; personal hx of skin cancer Immunizations Pneumococcal Vaccine: Received Pneumococcal  Vaccination: Yes Received Pneumococcal Vaccination On or After 60th Birthday: Yes Implantable Devices Yes Hospitalization / Surgery History Type of Hospitalization/Surgery breast lumpectomy with radioactive seed localization intramedullary nail intertrochanteric (left) transcatheter aortic valve replacement coronary stent intervention coronary angioplasty with stent placement abdominal hysterectomy basal cell carcinoma excision cataract extraction w/ intraocular lens implant-bilateral finger surgery squamous cell carcinoma excision Family and Social History Cancer: Yes - Father; Diabetes: No; Heart Disease: No; Hereditary Spherocytosis: No; Hypertension: No; Kidney Disease: No; Lung Disease: No; SeizuresELZORA, [name] ([id number]) [id number] Page 9 of 10 No; Stroke: No; Never smoker; Marital Status - Widowed; Alcohol Use: Never; Drug Use: No History; Caffeine Use: Never; Financial Concerns: No; Food, Clothing or Shelter Needs: No; Support System Lacking: No;  Transportation Concerns: No Physician Affirmation I have reviewed and agree with the above information. Electronic Signature(s) Signed: 07/03/2022 12:48:01 PM By: Geralyn Corwin DO Signed: 07/03/2022 4:08:55 PM By: Fonnie Mu RN Signed: 08/14/2022 7:49:05 AM By: Brenton Grills Entered By: Brenton Grills on 07/03/2022 12:44:18 -------------------------------------------------------------------------------- SuperBill Details Patient Name: Date of Service: [name], [place] 07/03/2022 Medical Record Number: [id number] Patient Account Number: [id number] Date of Birth/Sex: Treating RN: 12-21-1929 (89 y.o. F) Primary Care Provider: Creola Corn Other Clinician: Referring Provider: Treating Provider/Extender: Octavia Bruckner in Treatment: 0 Diagnosis Coding ICD-10 Codes Code Description [phone] Non-pressure chronic ulcer of other part of right lower leg with fat layer  exposed I87.311 Chronic venous hypertension (idiopathic) with ulcer of right lower extremity T79.8XXA Other early complications of trauma, initial encounter Z95.2 Presence of prosthetic heart valve I48.0 Paroxysmal atrial fibrillation Z79.01 Long term (current) use of anticoagulants C50.412 Malignant neoplasm of upper-outer quadrant of left female breast Facility Procedures : CPT4 Code: [id number] Description: WOUND CARE VISIT-LEV 3 NEW PT Modifier: 25 Quantity: 1 : CPT4 Code: [id number] Description: 11042 - DEB SUBQ TISSUE 20 SQ CM/< ICD-10 Diagnosis Description L97.812 Non-pressure chronic ulcer of other part of right lower leg with fat layer exp I87.311 Chronic venous hypertension (idiopathic) with ulcer of right lower extremity Modifier: osed Quantity: 1 Physician Procedures : CPT4 Code Description Modifier [id number] - WC PHYS LEVEL 4 - NEW PT ICD-10 Diagnosis Description L97.812 Non-pressure chronic ulcer of other part of right lower leg with fat layer exposed I87.311 Chronic venous hypertension (idiopathic) with ulcer  of right lower extremity T79.8XXA Other early complications of trauma, initial encounter Z79.01 Long term (current) use of anticoagulants Quantity: 1 : [id number] 11042 - WC PHYS SUBQ TISS 20 SQ CM ICD-10 Diagnosis Description L97.812 Non-pressure chronic ulcer of other part of right lower leg with fat layer exposed I87.311 Chronic venous hypertension (idiopathic) with ulcer of right lower extremity Quantity: 1 Electronic Signature(s) Signed: 07/03/2022 4:06:47 PM By: Geralyn Corwin DO Signed: 08/14/2022 7:49:05 AM By: Brenton Grills Previous Signature: 07/03/2022 2:20:58 PM Version By: Ward Chatters, Gerald Leitz ([id number]) [id number] Page 10 of 10 Entered By: Brenton Grills on 07/03/2022 15:58:36

## 2022-09-07 DIAGNOSIS — M1712 Unilateral primary osteoarthritis, left knee: Secondary | ICD-10-CM | POA: Diagnosis not present

## 2022-09-07 DIAGNOSIS — I10 Essential (primary) hypertension: Secondary | ICD-10-CM | POA: Diagnosis not present

## 2022-09-07 DIAGNOSIS — Z950 Presence of cardiac pacemaker: Secondary | ICD-10-CM | POA: Diagnosis not present

## 2022-09-07 DIAGNOSIS — E038 Other specified hypothyroidism: Secondary | ICD-10-CM | POA: Diagnosis not present

## 2022-09-07 DIAGNOSIS — I4891 Unspecified atrial fibrillation: Secondary | ICD-10-CM | POA: Diagnosis not present

## 2022-09-07 DIAGNOSIS — I4892 Unspecified atrial flutter: Secondary | ICD-10-CM | POA: Diagnosis not present

## 2022-09-07 DIAGNOSIS — Z79899 Other long term (current) drug therapy: Secondary | ICD-10-CM | POA: Diagnosis not present

## 2022-09-07 DIAGNOSIS — M7062 Trochanteric bursitis, left hip: Secondary | ICD-10-CM | POA: Diagnosis not present

## 2022-09-07 DIAGNOSIS — Z7901 Long term (current) use of anticoagulants: Secondary | ICD-10-CM | POA: Diagnosis not present

## 2022-09-11 DIAGNOSIS — Z950 Presence of cardiac pacemaker: Secondary | ICD-10-CM | POA: Diagnosis not present

## 2022-09-11 DIAGNOSIS — M7062 Trochanteric bursitis, left hip: Secondary | ICD-10-CM | POA: Diagnosis not present

## 2022-09-11 DIAGNOSIS — I4892 Unspecified atrial flutter: Secondary | ICD-10-CM | POA: Diagnosis not present

## 2022-09-11 DIAGNOSIS — Z7901 Long term (current) use of anticoagulants: Secondary | ICD-10-CM | POA: Diagnosis not present

## 2022-09-11 DIAGNOSIS — E038 Other specified hypothyroidism: Secondary | ICD-10-CM | POA: Diagnosis not present

## 2022-09-11 DIAGNOSIS — M1712 Unilateral primary osteoarthritis, left knee: Secondary | ICD-10-CM | POA: Diagnosis not present

## 2022-09-11 DIAGNOSIS — I4891 Unspecified atrial fibrillation: Secondary | ICD-10-CM | POA: Diagnosis not present

## 2022-09-11 DIAGNOSIS — I10 Essential (primary) hypertension: Secondary | ICD-10-CM | POA: Diagnosis not present

## 2022-09-11 DIAGNOSIS — Z79899 Other long term (current) drug therapy: Secondary | ICD-10-CM | POA: Diagnosis not present

## 2022-09-12 ENCOUNTER — Emergency Department (HOSPITAL_COMMUNITY): Payer: Medicare PPO

## 2022-09-12 ENCOUNTER — Emergency Department (HOSPITAL_COMMUNITY)
Admission: EM | Admit: 2022-09-12 | Discharge: 2022-09-12 | Disposition: A | Discharge status: Home / Self Care | Payer: Medicare PPO | Attending: Emergency Medicine | Admitting: Emergency Medicine

## 2022-09-12 ENCOUNTER — Other Ambulatory Visit: Payer: Self-pay

## 2022-09-12 DIAGNOSIS — W01198A Fall on same level from slipping, tripping and stumbling with subsequent striking against other object, initial encounter: Secondary | ICD-10-CM | POA: Diagnosis not present

## 2022-09-12 DIAGNOSIS — Y9301 Activity, walking, marching and hiking: Secondary | ICD-10-CM | POA: Diagnosis not present

## 2022-09-12 DIAGNOSIS — S0990XA Unspecified injury of head, initial encounter: Secondary | ICD-10-CM | POA: Insufficient documentation

## 2022-09-12 DIAGNOSIS — S0093XA Contusion of unspecified part of head, initial encounter: Secondary | ICD-10-CM | POA: Diagnosis not present

## 2022-09-12 DIAGNOSIS — Z7901 Long term (current) use of anticoagulants: Secondary | ICD-10-CM | POA: Insufficient documentation

## 2022-09-12 NOTE — Discharge Instructions (Addendum)
Tylenol 1000 mg every 6 hours as needed for pain.  Take your prescribed indications at home as prescribed.  Follow-up your primary care doctor as needed.  You may always return the emergency room for reevaluation for any new or concerning symptoms.

## 2022-09-12 NOTE — ED Provider Notes (Signed)
[place] EMERGENCY DEPARTMENT AT [facility] Provider Note   CSN: [id number] Arrival date & time: 09/12/22  1935     History  Chief Complaint  Patient presents with   Head Injury 3 days ago    [name] [name] is a 89 y.o. female.  HPI Patient is a 89 year old female with a past medical history pertinent for paroxysmal A-fib on DOAC Eliquis she has a pacemaker.  She states that 3 days ago she was walking outside and tripped forwards over balance backwards and fell into a car that was parked behind her.  She states that she hit her head did not lose consciousness did not experience any nausea or vomiting.  She has felt well since that time however she went to urgent care today for reassurance but was told she needed to come to the emergency room.  She denies any headache nausea vomiting or any other symptoms.     Home Medications Prior to Admission medications   Medication Sig Start Date End Date Taking? Authorizing Provider  amiodarone (PACERONE) 200 MG tablet TAKE 1/2 TABLET(100 MG) BY MOUTH DAILY 03/19/22   [place], Peter M, MD  amLODipine (NORVASC) 5 MG tablet TAKE 1 TABLET(5 MG) BY MOUTH DAILY 02/13/22   [place], Peter M, MD  apixaban (ELIQUIS) 2.5 MG TABS tablet TAKE 1 TABLET(2.5 MG) BY MOUTH TWICE DAILY 07/12/22   [place], Peter M, MD  calcium carbonate (OS-CAL) 600 MG TABS Take 600 mg by mouth 2 (two) times daily with a meal.    [provider]  ezetimibe (ZETIA) 10 MG tablet Take 1 tablet (10 mg total) by mouth daily. TAKE 1 TABLET(10 MG) BY MOUTH DAILY Strength: 10 mg 03/06/22   [place], Peter M, MD  feeding supplement, ENSURE ENLIVE, (ENSURE ENLIVE) LIQD Take 237 mLs by mouth 2 (two) times daily between meals. Patient taking differently: Take 237 mLs by mouth daily. 10/24/18   Montez Morita, PA-C  fluticasone [facility]) 50 MCG/ACT nasal spray Place 2 sprays into both nostrils daily as needed for allergies (congestion). 06/30/18   [provider]   furosemide (LASIX) 20 MG tablet Take one tablet (20mg ) every other day. 03/13/22   [place], Peter M, MD  losartan (COZAAR) 25 MG tablet TAKE 1 TABLET(25 MG) BY MOUTH DAILY Patient not taking: Reported on 07/25/2022 05/14/22   [place], Peter M, MD  metoprolol tartrate (LOPRESSOR) 25 MG tablet Take 1.5 tablets (37.5 mg total) by mouth 2 (two) times daily. 03/22/22   [place], Peter M, MD  Multiple Vitamins-Minerals (PRESERVISION AREDS 2) CAPS Take 1 capsule by mouth 2 (two) times daily.    [provider]  nitroGLYCERIN (NITROSTAT) 0.4 MG SL tablet For chest pain, tightness, or pressure. While sitting, place 1 tablet under tongue. May be used every 5 minutes as needed, for up to 15 minutes. Do not use more than 3 tablets. 03/13/22   [place], Peter M, MD  pantoprazole (PROTONIX) 40 MG tablet Take 1 tablet (40 mg total) by mouth 2 (two) times daily before a meal. 09/21/21   [place], Peter M, MD  polyvinyl alcohol (LIQUIFILM TEARS) 1.4 % ophthalmic solution Place 1-2 drops into both eyes 3 (three) times daily as needed for dry eyes.    [provider]  SYNTHROID 125 MCG tablet Take 125 mcg by mouth daily before breakfast.  08/04/18   [provider]      Allergies    Penicillins, Tetanus toxoid, Demerol, Clindamycin/lincomycin, Letrozole, Meningococcal poly tetanus conj vaccine acwy, Moxifloxacin  hcl, and Codeine    Review of Systems   Review of Systems  Physical Exam Updated Vital Signs BP (!) 165/72 (BP Location: Right Arm)   Pulse 61   Temp 98 F (36.7 C) (Oral)   Resp 18   Ht 5\' 4"  (1.626 m)   Wt 51.7 kg   SpO2 96%   BMI 19.57 kg/m  Physical Exam Vitals and nursing note reviewed.  Constitutional:      General: She is not in acute distress. HENT:     Head: Normocephalic and atraumatic.     Nose: Nose normal.     Mouth/Throat:     Mouth: Mucous membranes are moist.  Eyes:     General: No scleral icterus. Cardiovascular:     Rate and Rhythm: Normal rate and regular  rhythm.     Pulses: Normal pulses.     Heart sounds: Normal heart sounds.  Pulmonary:     Effort: Pulmonary effort is normal. No respiratory distress.     Breath sounds: No wheezing.  Abdominal:     Palpations: Abdomen is soft.     Tenderness: There is no abdominal tenderness.  Musculoskeletal:     Cervical back: Normal range of motion.     Right lower leg: No edema.     Left lower leg: No edema.  Skin:    General: Skin is warm and dry.     Capillary Refill: Capillary refill takes less than 2 seconds.  Neurological:     Mental Status: She is alert. Mental status is at baseline.     Comments: Smile symmetric, moves all 4 extremities, sensation intact in all 4 extremities  Psychiatric:        Mood and Affect: Mood normal.        Behavior: Behavior normal.     ED Results / Procedures / Treatments   Labs (all labs ordered are listed, but only abnormal results are displayed) Labs Reviewed - No data to display  EKG None  Radiology CT Head Wo Contrast  Result Date: 09/12/2022 CLINICAL DATA:  Trauma 3 days ago EXAM: CT HEAD WITHOUT CONTRAST TECHNIQUE: Contiguous axial images were obtained from the base of the skull through the vertex without intravenous contrast. RADIATION DOSE REDUCTION: This exam was performed according to the departmental dose-optimization program which includes automated exposure control, adjustment of the mA and/or kV according to patient size and/or use of iterative reconstruction technique. COMPARISON:  12/17/2020 FINDINGS: Brain: No acute intracranial findings are seen. There are no signs of bleeding within the cranium. Cortical sulci are prominent. There is decreased density in periventricular white matter. Vascular: Unremarkable. Skull: No fracture is seen in calvarium. Sinuses/Orbits: There is opacification of the right sphenoid sinus which has not changed. Other: None. IMPRESSION: No acute intracranial findings are seen.  Atrophy. Chronic sphenoid sinusitis.  Electronically Signed   By: Ernie Avena M.D.   On: 09/12/2022 20:37    Procedures Procedures    Medications Ordered in ED Medications - No data to display  ED Course/ Medical Decision Making/ A&P Clinical Course as of 09/12/22 2145  Wed Sep 12, 2022  2049 CT Head Wo Contrast Neg head CT [HN]  2135 Overballanced on Monday and fell backwards smacked head against  [WF]    Clinical Course User Index [HN] Loetta Rough, MD [WF] Gailen Shelter, [place]  Medical Decision Making Amount and/or Complexity of Data Reviewed Radiology: ordered. Decision-making details documented in ED Course.   Patient is a 89 year old female with a past medical history pertinent for paroxysmal A-fib on DOAC Eliquis she has a pacemaker.  She states that 3 days ago she was walking outside and tripped forwards over balance backwards and fell into a car that was parked behind her.  She states that she hit her head did not lose consciousness did not experience any nausea or vomiting.  She has felt well since that time however she went to urgent care today for reassurance but was told she needed to come to the emergency room.  She denies any headache nausea vomiting or any other symptoms.  Patient on exam is well-appearing  CT head unremarkable.  I personally viewed images and agree with radiology read.  No bleed present.  Will discharge patient home with follow-up with primary care for her regularly scheduled appointments.  Return to the emergency room for any new or concerning symptoms.  Final Clinical Impression(s) / ED Diagnoses Final diagnoses:  Injury of head, initial encounter    Rx / DC Orders ED Discharge Orders     None         Gailen Shelter, [place] 09/12/22 2146    Loetta Rough, MD 09/12/22 [phone]

## 2022-09-12 NOTE — ED Triage Notes (Signed)
Patient accidentally hit her head against the car's door frame last Monday , denies injury/no headache or LOC , alert and oriented , requesting CT scan of head , Dr. Adela Lank ( EDP) notified of patient's condition , he ordered CT scan .

## 2022-09-13 DIAGNOSIS — I4892 Unspecified atrial flutter: Secondary | ICD-10-CM | POA: Diagnosis not present

## 2022-09-13 DIAGNOSIS — M7062 Trochanteric bursitis, left hip: Secondary | ICD-10-CM | POA: Diagnosis not present

## 2022-09-13 DIAGNOSIS — Z7901 Long term (current) use of anticoagulants: Secondary | ICD-10-CM | POA: Diagnosis not present

## 2022-09-13 DIAGNOSIS — E038 Other specified hypothyroidism: Secondary | ICD-10-CM | POA: Diagnosis not present

## 2022-09-13 DIAGNOSIS — I10 Essential (primary) hypertension: Secondary | ICD-10-CM | POA: Diagnosis not present

## 2022-09-13 DIAGNOSIS — Z950 Presence of cardiac pacemaker: Secondary | ICD-10-CM | POA: Diagnosis not present

## 2022-09-13 DIAGNOSIS — Z79899 Other long term (current) drug therapy: Secondary | ICD-10-CM | POA: Diagnosis not present

## 2022-09-13 DIAGNOSIS — I4891 Unspecified atrial fibrillation: Secondary | ICD-10-CM | POA: Diagnosis not present

## 2022-09-13 DIAGNOSIS — M1712 Unilateral primary osteoarthritis, left knee: Secondary | ICD-10-CM | POA: Diagnosis not present

## 2022-09-18 ENCOUNTER — Telehealth: Payer: Self-pay

## 2022-09-18 NOTE — Telephone Encounter (Signed)
Transition Care Management Unsuccessful Follow-up Telephone Call  Date of discharge and from where:  09/12/2022 [facility]  Attempts:  1st Attempt  Reason for unsuccessful TCM follow-up call:  Unable to reach patient  Jeffrie Lofstrom [facility]  [facility] Population Health Community Resource Care Guide   [email or web address]  ?? [phone]   Website: [email or web address]  [email or web address]

## 2022-09-18 NOTE — Telephone Encounter (Signed)
Transition Care Management Unsuccessful Follow-up Telephone Call  Date of discharge and from where:  09/12/2022 [facility]  Attempts:  2nd Attempt  Reason for unsuccessful TCM follow-up call:  No answer/busy  Sharlet Notaro [facility]  [facility] Population Health Community Resource Care Guide   [email or web address]  ?? [phone]   Website: [email or web address]  [email or web address]

## 2022-09-20 DIAGNOSIS — I4891 Unspecified atrial fibrillation: Secondary | ICD-10-CM | POA: Diagnosis not present

## 2022-09-20 DIAGNOSIS — I4892 Unspecified atrial flutter: Secondary | ICD-10-CM | POA: Diagnosis not present

## 2022-09-20 DIAGNOSIS — M1712 Unilateral primary osteoarthritis, left knee: Secondary | ICD-10-CM | POA: Diagnosis not present

## 2022-09-20 DIAGNOSIS — I10 Essential (primary) hypertension: Secondary | ICD-10-CM | POA: Diagnosis not present

## 2022-09-20 DIAGNOSIS — Z79899 Other long term (current) drug therapy: Secondary | ICD-10-CM | POA: Diagnosis not present

## 2022-09-20 DIAGNOSIS — Z950 Presence of cardiac pacemaker: Secondary | ICD-10-CM | POA: Diagnosis not present

## 2022-09-20 DIAGNOSIS — Z7901 Long term (current) use of anticoagulants: Secondary | ICD-10-CM | POA: Diagnosis not present

## 2022-09-20 DIAGNOSIS — M7062 Trochanteric bursitis, left hip: Secondary | ICD-10-CM | POA: Diagnosis not present

## 2022-09-20 DIAGNOSIS — E038 Other specified hypothyroidism: Secondary | ICD-10-CM | POA: Diagnosis not present

## 2022-09-21 DIAGNOSIS — E038 Other specified hypothyroidism: Secondary | ICD-10-CM | POA: Diagnosis not present

## 2022-09-21 DIAGNOSIS — Z7901 Long term (current) use of anticoagulants: Secondary | ICD-10-CM | POA: Diagnosis not present

## 2022-09-21 DIAGNOSIS — Z79899 Other long term (current) drug therapy: Secondary | ICD-10-CM | POA: Diagnosis not present

## 2022-09-21 DIAGNOSIS — I4892 Unspecified atrial flutter: Secondary | ICD-10-CM | POA: Diagnosis not present

## 2022-09-21 DIAGNOSIS — I4891 Unspecified atrial fibrillation: Secondary | ICD-10-CM | POA: Diagnosis not present

## 2022-09-21 DIAGNOSIS — M1712 Unilateral primary osteoarthritis, left knee: Secondary | ICD-10-CM | POA: Diagnosis not present

## 2022-09-21 DIAGNOSIS — I10 Essential (primary) hypertension: Secondary | ICD-10-CM | POA: Diagnosis not present

## 2022-09-21 DIAGNOSIS — M7062 Trochanteric bursitis, left hip: Secondary | ICD-10-CM | POA: Diagnosis not present

## 2022-09-21 DIAGNOSIS — Z950 Presence of cardiac pacemaker: Secondary | ICD-10-CM | POA: Diagnosis not present

## 2022-09-25 ENCOUNTER — Ambulatory Visit (INDEPENDENT_AMBULATORY_CARE_PROVIDER_SITE_OTHER): Payer: Medicare PPO

## 2022-09-25 DIAGNOSIS — Z79899 Other long term (current) drug therapy: Secondary | ICD-10-CM | POA: Diagnosis not present

## 2022-09-25 DIAGNOSIS — E038 Other specified hypothyroidism: Secondary | ICD-10-CM | POA: Diagnosis not present

## 2022-09-25 DIAGNOSIS — M7062 Trochanteric bursitis, left hip: Secondary | ICD-10-CM | POA: Diagnosis not present

## 2022-09-25 DIAGNOSIS — I4892 Unspecified atrial flutter: Secondary | ICD-10-CM | POA: Diagnosis not present

## 2022-09-25 DIAGNOSIS — I10 Essential (primary) hypertension: Secondary | ICD-10-CM | POA: Diagnosis not present

## 2022-09-25 DIAGNOSIS — I4891 Unspecified atrial fibrillation: Secondary | ICD-10-CM | POA: Diagnosis not present

## 2022-09-25 DIAGNOSIS — M1712 Unilateral primary osteoarthritis, left knee: Secondary | ICD-10-CM | POA: Diagnosis not present

## 2022-09-25 DIAGNOSIS — Z950 Presence of cardiac pacemaker: Secondary | ICD-10-CM | POA: Diagnosis not present

## 2022-09-25 DIAGNOSIS — Z7901 Long term (current) use of anticoagulants: Secondary | ICD-10-CM | POA: Diagnosis not present

## 2022-09-25 DIAGNOSIS — I495 Sick sinus syndrome: Secondary | ICD-10-CM

## 2022-09-26 DIAGNOSIS — R42 Dizziness and giddiness: Secondary | ICD-10-CM | POA: Diagnosis not present

## 2022-09-26 DIAGNOSIS — I130 Hypertensive heart and chronic kidney disease with heart failure and stage 1 through stage 4 chronic kidney disease, or unspecified chronic kidney disease: Secondary | ICD-10-CM | POA: Diagnosis not present

## 2022-09-26 DIAGNOSIS — I480 Paroxysmal atrial fibrillation: Secondary | ICD-10-CM | POA: Diagnosis not present

## 2022-09-26 DIAGNOSIS — I4892 Unspecified atrial flutter: Secondary | ICD-10-CM | POA: Diagnosis not present

## 2022-09-26 DIAGNOSIS — M1712 Unilateral primary osteoarthritis, left knee: Secondary | ICD-10-CM | POA: Diagnosis not present

## 2022-09-26 DIAGNOSIS — Z79899 Other long term (current) drug therapy: Secondary | ICD-10-CM | POA: Diagnosis not present

## 2022-09-26 DIAGNOSIS — Z7901 Long term (current) use of anticoagulants: Secondary | ICD-10-CM | POA: Diagnosis not present

## 2022-09-26 DIAGNOSIS — R296 Repeated falls: Secondary | ICD-10-CM | POA: Diagnosis not present

## 2022-09-26 DIAGNOSIS — I10 Essential (primary) hypertension: Secondary | ICD-10-CM | POA: Diagnosis not present

## 2022-09-26 DIAGNOSIS — E038 Other specified hypothyroidism: Secondary | ICD-10-CM | POA: Diagnosis not present

## 2022-09-26 DIAGNOSIS — Z950 Presence of cardiac pacemaker: Secondary | ICD-10-CM | POA: Diagnosis not present

## 2022-09-26 DIAGNOSIS — I4891 Unspecified atrial fibrillation: Secondary | ICD-10-CM | POA: Diagnosis not present

## 2022-09-26 DIAGNOSIS — J323 Chronic sphenoidal sinusitis: Secondary | ICD-10-CM | POA: Diagnosis not present

## 2022-09-26 DIAGNOSIS — M7062 Trochanteric bursitis, left hip: Secondary | ICD-10-CM | POA: Diagnosis not present

## 2022-09-26 LAB — CUP PACEART REMOTE DEVICE CHECK
Battery Impedance: 659 Ohm
Battery Remaining Longevity: 73 mo
Battery Voltage: 2.77 V
Brady Statistic AP VP Percent: 1 %
Brady Statistic AP VS Percent: 96 %
Brady Statistic AS VP Percent: 0 %
Brady Statistic AS VS Percent: 3 %
Date Time Interrogation Session: 20240723092607
Implantable Lead Connection Status: 753985
Implantable Lead Connection Status: 753985
Implantable Lead Implant Date: 20180807
Implantable Lead Implant Date: 20180807
Implantable Lead Location: 753859
Implantable Lead Location: 753860
Implantable Lead Model: 5076
Implantable Lead Model: 5076
Implantable Pulse Generator Implant Date: 20180807
Lead Channel Impedance Value: 325 Ohm
Lead Channel Impedance Value: 517 Ohm
Lead Channel Pacing Threshold Amplitude: 0.75 V
Lead Channel Pacing Threshold Amplitude: 0.75 V
Lead Channel Pacing Threshold Pulse Width: 0.4 ms
Lead Channel Pacing Threshold Pulse Width: 0.4 ms
Lead Channel Setting Pacing Amplitude: 2 V
Lead Channel Setting Pacing Amplitude: 2.5 V
Lead Channel Setting Pacing Pulse Width: 0.4 ms
Lead Channel Setting Sensing Sensitivity: 5.6 mV
Zone Setting Status: 755011
Zone Setting Status: 755011

## 2022-09-26 NOTE — Progress Notes (Signed)
[name] [name], ABOOD ([id number]) [id number] Page 1 of 5 Visit Report for 08/07/2022 HPI Details Patient Name: Date of Service: [name] [name] 08/07/2022 3:30 PM Medical Record Number: [id number] Patient Account Number: [id number] Date of Birth/Sex: Treating RN: 1930/01/05 (89 y.o. F) Primary Care Provider: Creola Corn Other Clinician: Referring Provider: Treating Provider/Extender: Lacretia Leigh in Treatment: 5 History of Present Illness HPI Description: 07/03/2022 [name] is a 89 year old female with a past medical history of left breast cancer, severe aortic stenosis status post TAVR, paroxysmal A-fib on Eliquis and venous insufficiency that presents to the clinic for a 2-week history of nonhealing wound to the right anterior leg. She states she hit her leg against a car door. She has been using mupirocin ointment to the wound bed and she has been prescribed doxycycline. She is currently taking this. She currently denies systemic signs of infection. She has compression stockings but does not wear them. 5/7; this is a 89 year old woman who traumatized her leg on a car door several weeks ago this is in the setting of chronic venous insufficiency. She tells me she has had previous wounds that have healed although I do not have that in the record. Her wound today is measuring smaller. We have been using Hydrofera Blue, Zetuvit, under Urgo 2 compression. It is not clear if she has medical grade compression stockings at home but in any case she does not wear them 5/13; patient presents for follow-up. She states that the compression wrap was too tight and she had to take this off. She has been keeping the area covered. She currently denies signs of infection. 5/20; patient presents for follow-up. We have been using PolyMem with antibiotic ointment under compression therapy to the right lower extremity. She tolerated the wrap well. She has no  issues or complaints today. Wound is smaller. 5/28; patient presents for follow-up. We have been using PolyMem with antibiotic ointment under compression therapy to the right lower extremity. The wound is smaller. 6/4; the area on the right anterior lower leg which is traumatic in the setting of chronic venous insufficiency has closed over. She has very fragile skin in her bilateral anterior lower legs with extensive hemosiderin deposition. She does not have compression stockings Electronic Signature(s) Signed: 08/07/2022 4:47:09 PM By: Baltazar Najjar MD Entered By: Baltazar Najjar on 08/07/2022 16:14:00 -------------------------------------------------------------------------------- Physical Exam Details Patient Name: Date of Service: [name] [name]. 08/07/2022 3:30 PM Medical Record Number: [id number] Patient Account Number: [id number] Date of Birth/Sex: Treating RN: 06/05/1929 (89 y.o. F) Primary Care Provider: Creola Corn Other Clinician: Referring Provider: Treating Provider/Extender: Lacretia Leigh in Treatment: 5 Constitutional Sitting or standing Blood Pressure is within target range for patient.. Pulse regular and within target range for patient.Marland Kitchen Respirations regular, non-labored and within target range.. Temperature is normal and within the target range for the patient.. Notes Wound exam; right anterior lower extremity is healed but looks somewhat fragile. Edema control is good there is no evidence of infection Electronic Signature(s) Signed: 08/07/2022 4:47:09 PM By: Baltazar Najjar MD Entered By: Baltazar Najjar on 08/07/2022 16:14:54 Freid, Gerald Leitz ([id number]) [id number].pdf Page 2 of 5 -------------------------------------------------------------------------------- Physician Orders Details Patient Name: Date of Service: [name] [name] 08/07/2022 3:30 PM Medical Record Number: [id number] Patient Account Number: [id number] Date of  Birth/Sex: Treating RN: 06-Jul-1929 (89 y.o. Or[place] Govern Primary Care Provider: Creola Corn Other Clinician: Referring Provider: Treating Provider/Extender: Lacretia Leigh in Treatment: 5 Verbal /  Phone Orders: No Diagnosis Coding Discharge From [facility] Services Discharge from Wound Care Center - Congratulations!!! Please pick up compression stockings and also order from elastic therapies Anesthetic (In clinic) Topical Lidocaine 4% applied to wound bed Bathing/ Shower/ Hygiene May shower and wash wound with soap and water. Edema Control - Lymphedema / SCD / Other Left Lower Extremity Elevate legs to the level of the heart or above for 30 minutes daily and/or when sitting for 3-4 times a day throughout the day. Avoid standing for long periods of time. Moisturize legs daily. Compression stocking or Garment 20-30 mm/Hg pressure to: - both legs Electronic Signature(s) Signed: 08/07/2022 4:47:09 PM By: Baltazar Najjar MD Signed: 08/07/2022 4:48:42 PM By: Redmond Pulling RN, BSN Entered By: Redmond Pulling on 08/07/2022 16:10:16 -------------------------------------------------------------------------------- Problem List Details Patient Name: Date of Service: [name] [name]. 08/07/2022 3:30 PM Medical Record Number: [id number] Patient Account Number: [id number] Date of Birth/Sex: Treating RN: 25-Jun-1929 (89 y.o. F) Primary Care Provider: Creola Corn Other Clinician: Referring Provider: Treating Provider/Extender: Lacretia Leigh in Treatment: 5 Active Problems ICD-10 Encounter Code Description Active Date MDM Diagnosis [phone] Non-pressure chronic ulcer of other part of right lower leg with fat layer 07/03/2022 No Yes exposed I87.311 Chronic venous hypertension (idiopathic) with ulcer of right lower extremity 07/03/2022 No Yes T79.8XXA Other early complications of trauma, initial encounter 07/03/2022 No Yes [name] [name] ([id number])  [id number] Page 3 of 5 Z95.2 Presence of prosthetic heart valve 07/03/2022 No Yes I48.0 Paroxysmal atrial fibrillation 07/03/2022 No Yes Z79.01 Long term (current) use of anticoagulants 07/03/2022 No Yes C50.412 Malignant neoplasm of upper-outer quadrant of left female breast 07/03/2022 No Yes Inactive Problems Resolved Problems Electronic Signature(s) Signed: 08/07/2022 4:47:09 PM By: Baltazar Najjar MD Entered By: Baltazar Najjar on 08/07/2022 16:13:13 -------------------------------------------------------------------------------- Progress Note Details Patient Name: Date of Service: [name] [name]. 08/07/2022 3:30 PM Medical Record Number: [id number] Patient Account Number: [id number] Date of Birth/Sex: Treating RN: [birth year] (89 y.o. F) Primary Care Provider: Creola Corn Other Clinician: Referring Provider: Treating Provider/Extender: Lacretia Leigh in Treatment: 5 Subjective History of Present Illness (HPI) 07/03/2022 Ms. Anabia Weatherwax is a 89 year old female with a past medical history of left breast cancer, severe aortic stenosis status post TAVR, paroxysmal A-fib on Eliquis and venous insufficiency that presents to the clinic for a 2-week history of nonhealing wound to the right anterior leg. She states she hit her leg against a car door. She has been using mupirocin ointment to the wound bed and she has been prescribed doxycycline. She is currently taking this. She currently denies systemic signs of infection. She has compression stockings but does not wear them. 5/7; this is a 89 year old woman who traumatized her leg on a car door several weeks ago this is in the setting of chronic venous insufficiency. She tells me she has had previous wounds that have healed although I do not have that in the record. Her wound today is measuring smaller. We have been using Hydrofera Blue, Zetuvit, under Urgo 2 compression. It is not clear if she has  medical grade compression stockings at home but in any case she does not wear them 5/13; patient presents for follow-up. She states that the compression wrap was too tight and she had to take this off. She has been keeping the area covered. She currently denies signs of infection. 5/20; patient presents for follow-up. We have been using PolyMem with antibiotic ointment under compression therapy to the right lower extremity.  She tolerated the wrap well. She has no issues or complaints today. Wound is smaller. 5/28; patient presents for follow-up. We have been using PolyMem with antibiotic ointment under compression therapy to the right lower extremity. The wound is smaller. 6/4; the area on the right anterior lower leg which is traumatic in the setting of chronic venous insufficiency has closed over. She has very fragile skin in her bilateral anterior lower legs with extensive hemosiderin deposition. She does not have compression stockings Objective Constitutional LILLAN, MCCREADIE ([id number]) [id number] Page 4 of 5 Sitting or standing Blood Pressure is within target range for patient.. Pulse regular and within target range for patient.Marland Kitchen Respirations regular, non-labored and within target range.. Temperature is normal and within the target range for the patient.. Vitals Time Taken: 3:51 PM, Height: 65 in, Weight: 110 lbs, BMI: 18.3, Temperature: 97.9 F, Pulse: 61 bpm, Respiratory Rate: 18 breaths/min, Blood Pressure: 140/65 mmHg. General Notes: Wound exam; right anterior lower extremity is healed but looks somewhat fragile. Edema control is good there is no evidence of infection Integumentary (Hair, Skin) Wound #1 status is Healed - Epithelialized. Original cause of wound was Skin Tear/Laceration. The date acquired was: 06/19/2022. The wound has been in treatment 5 weeks. The wound is located on the Right,Lateral Lower Leg. The wound measures 0cm length x 0cm width x 0cm  depth; 0cm^2 area and 0cm^3 volume. There is no tunneling or undermining noted. There is a none present amount of drainage noted. The wound margin is distinct with the outline attached to the wound base. There is no granulation within the wound bed. There is no necrotic tissue within the wound bed. The periwound skin appearance exhibited: Dry/Scaly, Hemosiderin Staining. The periwound skin appearance did not exhibit: Callus, Crepitus, Excoriation, Induration, Rash, Scarring, Maceration, Atrophie Blanche, Cyanosis, Ecchymosis, Mottled, Pallor, Rubor, Erythema. Periwound temperature was noted as No Abnormality. Assessment Active Problems ICD-10 Non-pressure chronic ulcer of other part of right lower leg with fat layer exposed Chronic venous hypertension (idiopathic) with ulcer of right lower extremity Other early complications of trauma, initial encounter Presence of prosthetic heart valve Paroxysmal atrial fibrillation Long term (current) use of anticoagulants Malignant neoplasm of upper-outer quadrant of left female breast Plan Discharge From [facility] Services: Discharge from Wound Care Center - Congratulations!!! Please pick up compression stockings and also order from elastic therapies Anesthetic: (In clinic) Topical Lidocaine 4% applied to wound bed Bathing/ Shower/ Hygiene: May shower and wash wound with soap and water. Edema Control - Lymphedema / SCD / Other: Elevate legs to the level of the heart or above for 30 minutes daily and/or when sitting for 3-4 times a day throughout the day. Avoid standing for long periods of time. Moisturize legs daily. Compression stocking or Garment 20-30 mm/Hg pressure to: - both legs 1. I had a fairly long discussion with this patient and her daughter. I am doubtful she can get 20/30 below-knee stockings on and off. Her daughter who is present comes to her 3 times a week. 2. I was even prepared to rewrap the right leg this week while we await compression  stockings to arrive however after some back-and-forth I agreed to just use support stockings bilaterally which I think the patient would have some chance of getting on. 3. This wound was initially traumatic. She does not have an extensive wound care history 4. We discharged her with as needed follow-up Electronic Signature(s) Signed: 09/26/2022 10:32:00 AM By: Pearletha Alfred Signed: 09/26/2022 4:14:24 PM By: Baltazar Najjar MD Previous  Signature: 08/07/2022 4:47:09 PM Version By: Baltazar Najjar MD Entered By: Pearletha Alfred on 09/26/2022 10:32:00 -------------------------------------------------------------------------------- SuperBill Details Patient Name: Date of Service: Lynford Humphrey, [name] 08/07/2022 Medical Record Number: [id number] Patient Account Number: [id number] Date of Birth/Sex: Treating RN: Jan 26, 1930 (89 y.o. Mirai Greenwood, Gerald Leitz ([id number]) [id number] Page 5 of 5 Primary Care Provider: Creola Corn Other Clinician: Referring Provider: Treating Provider/Extender: Lacretia Leigh in Treatment: 5 Diagnosis Coding ICD-10 Codes Code Description [phone] Non-pressure chronic ulcer of other part of right lower leg with fat layer exposed I87.311 Chronic venous hypertension (idiopathic) with ulcer of right lower extremity T79.8XXA Other early complications of trauma, initial encounter Z95.2 Presence of prosthetic heart valve I48.0 Paroxysmal atrial fibrillation Z79.01 Long term (current) use of anticoagulants C50.412 Malignant neoplasm of upper-outer quadrant of left female breast Facility Procedures : CPT4 Code: [id number] Description: 84696 - WOUND CARE VISIT-LEV 2 EST PT Modifier: Quantity: 1 Physician Procedures : CPT4 Code Description Modifier [id number] - WC PHYS LEVEL 3 - EST PT ICD-10 Diagnosis Description L97.812 Non-pressure chronic ulcer of other part of right lower leg with fat layer exposed I87.311 Chronic venous hypertension  (idiopathic) with ulcer  of right lower extremity Quantity: 1 Electronic Signature(s) Signed: 08/07/2022 4:47:09 PM By: Baltazar Najjar MD Signed: 08/07/2022 4:48:42 PM By: Redmond Pulling RN, BSN Entered By: Redmond Pulling on 08/07/2022 16:24:29

## 2022-09-28 NOTE — Progress Notes (Deleted)
[name] [name] Date of Birth: Feb 20, 1930 Medical Record #[id number]  History of Present Illness: [name] [name] is seen today for follow up CAD and AV stenosis.  She has a history of paroxysmal atrial fibrillation with tachy-brady syndrome and is s/p pacemaker implant. She is on chronic anticoagulation with Eliquis. Echo in July 2015 showed moderate aortic stenosis that had progressed since 2012. Repeat in July 2016 and July 2017 showed no change. In April 2017 she had atrial fibrillation that converted spontaneously. This was felt to be triggered by a steroid injection. Her metoprolol was increased at that time. When seen in June 2017 she was noted to be in persistent Afib. We placed her on Flecainide but this was later stopped due to side effects. She was seen in November with recurrent and persistent Afib. Rate was controlled but she was symptomatic. Seen in Afib clinic and started on amiodarone with subsequent conversion to NSR.   She later developed symptoms of dyspnea.   She had PFTs in December that showed mild obstructive defect with marked decrease in diffusion capacity.  Myoview study was normal. When seen by Dr. Johney Frame in late July it was noted that she had failure of her RA lead. Her device was reprogrammed but she developed pectoral stimulation. She underwent explantation of her old generator and placement of a new left chest dual chamber pacemaker on October 09 2016. Because of her ongoing symptoms we recommended a right and left heart cath and this was done on 11/27/16. This demonstrated severe AS as well as a severe stenosis in the mid LAD. She subsequently underwent successful PCI of the LAD with DES on 12/20/16 with 2.75 x 16 mm Promus stent.   She underwent TAVR on January 29, 2017  with a #23 Edwards Sapien valve. Post op course complicated by pericarditis and AFib with RVR she was treated with anti-inflammatories and Amiodarone. Later Eliquis resumed and ASA stopped. Still on Plavix for  stent. Follow up Echo was Ok. Repeat Echo 01/22/18 still showed good results. Pacemaker check on 12/12/17 showed normal function without atrial high rate episodes. When seen in the valve clinic on 01/22/18 Plavix was discontinued.   She underwent  left breast lumpectomy with Dr Carolynne Edouard for newly diagnosed breast mass on 10/04/20. Initially placed on Letrozole but this was discontinued due to side effects.   Seen in ED on 5/6 due to Afib and spontaneously converted to NSR while in the ED. She felt palpitations and heavy chest when in Afib episode. Continued on amiodarone 100 mg daily, Eliquis 2.5 mg BID, and metoprolol 37.5 mg BID. Was seen back in Afib clinic on 5/22 and doing well.    On follow up today she has rare palpitations. No chest pain or dyspnea. No edema. Feels well. No dizziness or syncope. No bleeding. Was seen by Dr Elberta Fortis with good pacer function. She does note some days she feels really cold.    Current Outpatient Medications on File Prior to Visit  Medication Sig Dispense Refill   amiodarone (PACERONE) 200 MG tablet TAKE 1/2 TABLET(100 MG) BY MOUTH DAILY 45 tablet 3   amLODipine (NORVASC) 5 MG tablet TAKE 1 TABLET(5 MG) BY MOUTH DAILY 90 tablet 3   apixaban (ELIQUIS) 2.5 MG TABS tablet TAKE 1 TABLET(2.5 MG) BY MOUTH TWICE DAILY 180 tablet 1   calcium carbonate (OS-CAL) 600 MG TABS Take 600 mg by mouth 2 (two) times daily with a meal.     ezetimibe (ZETIA) 10 MG tablet Take 1  tablet (10 mg total) by mouth daily. TAKE 1 TABLET(10 MG) BY MOUTH DAILY Strength: 10 mg 90 tablet 3   feeding supplement, ENSURE ENLIVE, (ENSURE ENLIVE) LIQD Take 237 mLs by mouth 2 (two) times daily between meals. (Patient taking differently: Take 237 mLs by mouth daily.) 237 mL 12   fluticasone (FLONASE) 50 MCG/ACT nasal spray Place 2 sprays into both nostrils daily as needed for allergies (congestion).     furosemide (LASIX) 20 MG tablet Take one tablet (20mg ) every other day. 90 tablet 3   losartan (COZAAR)  25 MG tablet TAKE 1 TABLET(25 MG) BY MOUTH DAILY (Patient not taking: Reported on 07/25/2022) 90 tablet 1   metoprolol tartrate (LOPRESSOR) 25 MG tablet Take 1.5 tablets (37.5 mg total) by mouth 2 (two) times daily. 270 tablet 3   Multiple Vitamins-Minerals (PRESERVISION AREDS 2) CAPS Take 1 capsule by mouth 2 (two) times daily.     nitroGLYCERIN (NITROSTAT) 0.4 MG SL tablet For chest pain, tightness, or pressure. While sitting, place 1 tablet under tongue. May be used every 5 minutes as needed, for up to 15 minutes. Do not use more than 3 tablets. 25 tablet 12   pantoprazole (PROTONIX) 40 MG tablet Take 1 tablet (40 mg total) by mouth 2 (two) times daily before a meal. 180 tablet 3   polyvinyl alcohol (LIQUIFILM TEARS) 1.4 % ophthalmic solution Place 1-2 drops into both eyes 3 (three) times daily as needed for dry eyes.     SYNTHROID 125 MCG tablet Take 125 mcg by mouth daily before breakfast.      No current facility-administered medications on file prior to visit.    Allergies  Allergen Reactions   Penicillins Anaphylaxis and Other (See Comments)    Has patient had a PCN reaction causing immediate rash, facial/tongue/throat swelling, SOB or lightheadedness with hypotension: Yes Has patient had a PCN reaction causing severe rash involving mucus membranes or skin necrosis: No Has patient had a PCN reaction that required hospitalization: No Has patient had a PCN reaction occurring within the last 10 years: No If all of the above answers are "NO", then may proceed with Cephalosporin use.    Tetanus Toxoid Anaphylaxis   Demerol Nausea Only    Severe nausea   Clindamycin/Lincomycin     Sores in mouth   Letrozole Other (See Comments)   Meningococcal Poly Tetanus Conj Vaccine Acwy Other (See Comments)   Moxifloxacin Hcl Other (See Comments)   Codeine Nausea Only    Severe nausea    Past Medical History:  Diagnosis Date   CKD (chronic kidney disease)    Complication of anesthesia     Coronary artery disease    a. 12/20/2016: s/p DES to LAD    Diverticulosis    Family history of adverse reaction to anesthesia    sister also has post-op n/v   Family history of breast cancer 09/01/2020   GERD (gastroesophageal reflux disease)    Hemorrhoids    Hyperlipidemia    Hypertension    Hypothyroidism    Osteoarthritis    Paroxysmal atrial fibrillation (HCC)    a. on OAC with Xarelto but switched to Eliquis after stenting.    Personal history of skin cancer 09/01/2020   PONV (postoperative nausea and vomiting)    Presence of permanent cardiac pacemaker    S/P TAVR (transcatheter aortic valve replacement) 01/29/2017   23 mm Edwards Sapien 3 transcatheter heart valve placed via percutaneous right transfemoral approach     Past Surgical History:  Procedure Laterality Date   ABDOMINAL HYSTERECTOMY  1980   BASAL CELL CARCINOMA EXCISION     BREAST LUMPECTOMY WITH RADIOACTIVE SEED LOCALIZATION Left 10/04/2020   Procedure: LEFT BREAST LUMPECTOMY WITH RADIOACTIVE SEED LOCALIZATION;  Surgeon: Griselda Miner, MD;  Location: [facility] OR;  Service: General;  Laterality: Left;   CARDIAC CATHETERIZATION  ~ 11/2016   CATARACT EXTRACTION W/ INTRAOCULAR LENS  IMPLANT, BILATERAL Bilateral    CORONARY ANGIOPLASTY WITH STENT PLACEMENT  12/20/2016   "1 stent"   CORONARY STENT INTERVENTION N/A 12/20/2016   Procedure: CORONARY STENT INTERVENTION;  Surgeon: Swaziland,  M, MD;  Location: [facility] INVASIVE CV LAB;  Service: Cardiovascular;  Laterality: N/A;   FINGER SURGERY Left    "fell; developed skiers thumb; had to operate on it"   INSERT / REPLACE / REMOVE PACEMAKER  1993   for syptomatic bradycardia and syncope -- in [facility]   INTRAMEDULLARY (IM) NAIL INTERTROCHANTERIC Left 10/19/2018   Procedure: INTRAMEDULLARY (IM) NAIL INTERTROCHANTRIC;  Surgeon: Myrene Galas, MD;  Location: [facility] OR;  Service: Orthopedics;  Laterality: Left;   LEAD REVISION/REPAIR N/A 10/09/2016   New left subclavian MDT Adapta L  PPM dual chamber system implanted by Dr Johney Frame with previously placed R subclavian system abandoned   PACEMAKER GENERATOR CHANGE  2001   pulse generator replacement by Dr. March Rummage GENERATOR CHANGE  04/01/2008   PPM Medtronic -- model # ADDRL1 serial # [id number] -- pulse generator replacement by Dr. Reyes Ivan    RIGHT/LEFT HEART CATH AND CORONARY ANGIOGRAPHY N/A 11/27/2016   Procedure: RIGHT/LEFT HEART CATH AND CORONARY ANGIOGRAPHY;  Surgeon: Swaziland,  M, MD;  Location: [facility] INVASIVE CV LAB;  Service: Cardiovascular;  Laterality: N/A;   SQUAMOUS CELL CARCINOMA EXCISION     TEE WITHOUT CARDIOVERSION N/A 01/29/2017   Procedure: TRANSESOPHAGEAL ECHOCARDIOGRAM (TEE);  Surgeon: Tonny Bollman, MD;  Location: [facility] OR;  Service: Open Heart Surgery;  Laterality: N/A;   TONSILLECTOMY     TRANSCATHETER AORTIC VALVE REPLACEMENT, TRANSFEMORAL N/A 01/29/2017   Procedure: TRANSCATHETER AORTIC VALVE REPLACEMENT, TRANSFEMORAL;  Surgeon: Tonny Bollman, MD;  Location: [facility] OR;  Service: Open Heart Surgery;  Laterality: N/A;    Social History   Tobacco Use  Smoking Status Former   Current packs/day: 0.00   Average packs/day: 1.5 packs/day for 30.0 years (45.0 ttl pk-yrs)   Types: Cigarettes   Start date: 03/05/1948   Quit date: 03/05/1978   Years since quitting: 44.5  Smokeless Tobacco Never    Social History   Substance and Sexual Activity  Alcohol Use No    Family History  Problem Relation Age of Onset   Alzheimer's disease Mother    Kidney cancer Father        dx 44s   Breast cancer Maternal Aunt 67   Breast cancer Cousin        maternal cousin, dx >50    Review of Systems: As noted in history of present illness.  All other systems were reviewed and are negative.  Physical Exam: There were no vitals taken for this visit. GENERAL:  Well appearing, elderly WF in NAD. Walks with cane HEENT:  PERRL, EOMI, sclera are clear. Oropharynx is clear. NECK:  No jugular venous  distention, carotid upstroke brisk and symmetric, no bruits, no thyromegaly or adenopathy LUNGS:  Clear to auscultation bilaterally CHEST:  Unremarkable HEART:  RRR,  PMI not displaced or sustained,S1 and S2 within normal limits, no S3, no S4: no clicks, no rubs, very soft systolic murmur  at the apex. ABD:  Soft, nontender. BS +, no masses or bruits. No hepatomegaly, no splenomegaly EXT:  2 + pulses throughout, tr edema, no cyanosis no clubbing SKIN:  Warm and dry.  No rashes NEURO:  Alert and oriented x 3. Cranial nerves II through XII intact. PSYCH:  Cognitively intact   LABORATORY DATA:  Lab Results  Component Value Date   WBC 6.3 07/09/2022   HGB 13.4 07/09/2022   HCT 39.4 07/09/2022   PLT 152 07/09/2022   GLUCOSE 106 (H) 07/09/2022   CHOL 219 (H) 02/05/2018   TRIG 70 02/05/2018   HDL 103 02/05/2018   LDLCALC 102 (H) 02/05/2018   ALT 15 09/19/2021   AST 27 09/19/2021   NA 138 07/09/2022   K 3.9 07/09/2022   CL 100 07/09/2022   CREATININE 1.27 (H) 07/09/2022   BUN 33 (H) 07/09/2022   CO2 28 07/09/2022   TSH 3.890 09/19/2021   INR 1.23 01/29/2017   HGBA1C 5.5 01/28/2017   Labs dated 07/15/15: cholesterol 229, triglycerides 111, HDL 76, LDL 131.  Dated 07/19/16: cholesterol 247, triglycerides 71, HDL 88, LDL 145. BUN 28, creatinine 1.3. Other chemistries, CBC and TSH normal. Dated 03/15/17: creatinine 1.7, Hgb 11.3. Dated 10/22/17: cholesterol 251, triglycerides 66, HDL 93, LDL 145. Hgb 11.7, creatinine 1.3. TSH and ALT normal. Dated 01/12/19: cholesterol 224, triglycerides 105, HDL 96, LDL 107.  Dated 06/15/19: BUN 36, creatinine 1.5. CMET otherwise normal. CBC and TSH normal. Dated 12/26/20: cholesterol 199, triglycerides 51, HDL 90, LDL 99.  Dated 01/31/22: cholesterol 206, triglycerides 53, HDL 96, LDL 99. LFTs normal. TSH and Free T 4 were normal.   Ecg today shows atrial paced rhythm rate 82. Otherwise normal. I have personally reviewed and interpreted this  study.   Echo 07/26/20: 1. Left ventricular ejection fraction, by estimation, is 60 to 65%. The left ventricle has normal function. The left ventricle has no regional wall motion abnormalities. Left ventricular diastolic parameters are indeterminate. 2. Right ventricular systolic function is normal. The right ventricular size is normal. 3. Mild mitral valve regurgitation. 4. Tricuspid valve regurgitation is moderate. 5. S/p TAVR (01/29/17) 23 mm Edwards bioprosthesis. Peak and mean gradients through the valve are 17 and 8 mm Hg repcectively AVA (VTI 1.53 cm2) No significant change from gradients in echo of 01/22/18 . Aortic valve regurgitation is mild. Left Ventricle: Left ventricular ejection fraction, by estimation, is 60 to 65%. The left ventricle has normal function. The left ventricle has no regional wall motion abnormalities. The left ventricular internal cavity size was normal in size. There is no left ventricular hypertrophy. Left ventricular diastolic parameters are indeterminate. Right Ventricle: The right ventricular size is normal. Right vetricular wall thickness was not assessed. Right ventricular systolic function is normal. Left Atrium: Left atrial size was normal in size. Right Atrium: Right atrial size was normal in size. Pericardium: There is no evidence of pericardial effusion. Mitral Valve: There is mild thickening of the mitral valve leaflet(s). Mild mitral annular calcification. Mild  Assessment / Plan: 1. Atrial fibrillation with sick sinus syndrome. Prior history of  persistent AFib.  Now on amiodarone 100 mg daily. Maintaining NSR well. By pacer check has Afib 0.1% of the time.  Continue long-term anticoagulation. On  Eliquis 2.5 mg bid.  Dose adjusted for age and renal function. Labs done in November were good.   2. Aortic stenosis-   symptomatic. Now s/p TAVR. Stable exam. Echo May 2022 looked good.   3. CAD with significant  single vessel obstructive disease in  the mid LAD. S/p PCI of the LAD with DES October 2018.  On Eliquis. No ASA due to bleeding risk.  4. HTN- well controlled.  Will continue current therapy  5. Hyperlipidemia. Intolerant of statins. Now on Zetia 10 mg daily. LDL 99.   6. Chronic diastolic CHF. She is well compensated.   7. S/p femoral neck fracture with ORIF. Recovered.   8. Breast CA. S/p lumpectomy. Monitored by oncology. Did not tolerate antiestrogen therapy.   Follow up in 6 months.

## 2022-10-02 ENCOUNTER — Ambulatory Visit: Payer: Medicare PPO | Admitting: Cardiology

## 2022-10-03 DIAGNOSIS — I4891 Unspecified atrial fibrillation: Secondary | ICD-10-CM | POA: Diagnosis not present

## 2022-10-03 DIAGNOSIS — Z950 Presence of cardiac pacemaker: Secondary | ICD-10-CM | POA: Diagnosis not present

## 2022-10-03 DIAGNOSIS — E038 Other specified hypothyroidism: Secondary | ICD-10-CM | POA: Diagnosis not present

## 2022-10-03 DIAGNOSIS — I10 Essential (primary) hypertension: Secondary | ICD-10-CM | POA: Diagnosis not present

## 2022-10-03 DIAGNOSIS — Z7901 Long term (current) use of anticoagulants: Secondary | ICD-10-CM | POA: Diagnosis not present

## 2022-10-03 DIAGNOSIS — I4892 Unspecified atrial flutter: Secondary | ICD-10-CM | POA: Diagnosis not present

## 2022-10-03 DIAGNOSIS — M7062 Trochanteric bursitis, left hip: Secondary | ICD-10-CM | POA: Diagnosis not present

## 2022-10-03 DIAGNOSIS — Z79899 Other long term (current) drug therapy: Secondary | ICD-10-CM | POA: Diagnosis not present

## 2022-10-03 DIAGNOSIS — M1712 Unilateral primary osteoarthritis, left knee: Secondary | ICD-10-CM | POA: Diagnosis not present

## 2022-10-09 DIAGNOSIS — R051 Acute cough: Secondary | ICD-10-CM | POA: Diagnosis not present

## 2022-10-09 DIAGNOSIS — J069 Acute upper respiratory infection, unspecified: Secondary | ICD-10-CM | POA: Diagnosis not present

## 2022-10-09 DIAGNOSIS — Z7901 Long term (current) use of anticoagulants: Secondary | ICD-10-CM | POA: Diagnosis not present

## 2022-10-09 DIAGNOSIS — I480 Paroxysmal atrial fibrillation: Secondary | ICD-10-CM | POA: Diagnosis not present

## 2022-10-09 DIAGNOSIS — I130 Hypertensive heart and chronic kidney disease with heart failure and stage 1 through stage 4 chronic kidney disease, or unspecified chronic kidney disease: Secondary | ICD-10-CM | POA: Diagnosis not present

## 2022-10-10 NOTE — Progress Notes (Signed)
Remote pacemaker transmission.   

## 2022-10-11 DIAGNOSIS — I4892 Unspecified atrial flutter: Secondary | ICD-10-CM | POA: Diagnosis not present

## 2022-10-11 DIAGNOSIS — E038 Other specified hypothyroidism: Secondary | ICD-10-CM | POA: Diagnosis not present

## 2022-10-11 DIAGNOSIS — I10 Essential (primary) hypertension: Secondary | ICD-10-CM | POA: Diagnosis not present

## 2022-10-11 DIAGNOSIS — Z79899 Other long term (current) drug therapy: Secondary | ICD-10-CM | POA: Diagnosis not present

## 2022-10-11 DIAGNOSIS — M7062 Trochanteric bursitis, left hip: Secondary | ICD-10-CM | POA: Diagnosis not present

## 2022-10-11 DIAGNOSIS — M1712 Unilateral primary osteoarthritis, left knee: Secondary | ICD-10-CM | POA: Diagnosis not present

## 2022-10-11 DIAGNOSIS — Z7901 Long term (current) use of anticoagulants: Secondary | ICD-10-CM | POA: Diagnosis not present

## 2022-10-11 DIAGNOSIS — I4891 Unspecified atrial fibrillation: Secondary | ICD-10-CM | POA: Diagnosis not present

## 2022-10-11 DIAGNOSIS — Z950 Presence of cardiac pacemaker: Secondary | ICD-10-CM | POA: Diagnosis not present

## 2022-10-11 NOTE — Progress Notes (Signed)
  Electrophysiology Office Note:   Date:  10/12/2022  ID:  [name] [name], [name] [name] Sep 27, 1929, MRN [id number]  Primary Cardiologist: Peter Swaziland, MD Electrophysiologist: Regan Lemming, MD  {Click to update primary MD,subspecialty MD or APP then REFRESH:1}    History of Present Illness:   [name] [name] is a 89 y.o. female with h/o CAD s/p LAD stent, HTN, AF, AS s/p TAVR, VHD, SSS / tachy-brady s/p MDT dual-chamber PPM seen today for routine electrophysiology followup.   Last seen in AF Clinic 07/2022 after an ER visit for AF where she spontaneously converted to SR. No additional known episodes since that time.   Remote device check 09/25/22 showed normal remote with stable battery and lead parameters.   Since last being seen in our clinic the patient reports doing well. She remains active in her home, lives independently.   She denies chest pain, palpitations, dyspnea, PND, orthopnea, nausea, vomiting, dizziness, syncope, edema, weight gain, or early satiety.   Review of systems complete and found to be negative unless listed in HPI.    EP Information / Studies Reviewed:        PPM Interrogation-  reviewed in detail today,  See PACEART report.  Device History: Medtronic Dual Chamber PPM implanted 10/09/2016 for Sinus Node Dysfunction  Risk Assessment/Calculations:    CHA2DS2-VASc Score = 6  {Confirm score is correct.  If not, click here to update score.  REFRESH note.  :1} This indicates a 9.7% annual risk of stroke. The patient's score is based upon: CHF History: 1 HTN History: 1 Diabetes History: 0 Stroke History: 0 Vascular Disease History: 1 Age Score: 2 Gender Score: 1   {This patient has a significant risk of stroke if diagnosed with atrial fibrillation.  Please consider VKA or DOAC agent for anticoagulation if the bleeding risk is acceptable.   You can also use the SmartPhrase .[email or web address] for documentation.   :[id number]}          Physical Exam:   VS:  BP 120/66    Pulse 77   Ht 5\' 4"  (1.626 m)   Wt 111 lb 3.2 oz (50.4 kg)   SpO2 96%   BMI 19.09 kg/m    Wt Readings from Last 3 Encounters:  10/12/22 111 lb 3.2 oz (50.4 kg)  09/12/22 114 lb (51.7 kg)  07/25/22 115 lb 9.6 oz (52.4 kg)     GEN: Well nourished, well developed in no acute distress NECK: No JVD; No carotid bruits CARDIAC: Regular rate and rhythm, no murmurs, rubs, gallops RESPIRATORY:  Clear to auscultation without rales, wheezing or rhonchi  ABDOMEN: Soft, non-tender, non-distended EXTREMITIES:  No edema; No deformity   ASSESSMENT AND PLAN:    SND s/p Medtronic PPM  -Normal PPM function -See Pace Art report -No changes today  Persistent Atrial Fibrillation  TSH 09/19/21 3.8 -continue amiodarone 100mg  daily  -BB as below   Secondary Hypercoagulable State  -continue eliquis 2.5mg  BID, dose reviewed > appropriate reduction by age/wt  HTN  -well controlled > on norvasc, lasix, losartan, lopressor   Disposition:   Follow up with Dr. Elberta Fortis in 12 months  Signed, Canary Brim, MSN, APRN, NP-C, AGACNP-BC [facility] - Electrophysiology  10/12/2022, 12:36 PM

## 2022-10-12 ENCOUNTER — Ambulatory Visit: Payer: Medicare PPO | Admitting: Pulmonary Disease

## 2022-10-12 ENCOUNTER — Encounter: Payer: Self-pay | Admitting: Student

## 2022-10-12 VITALS — BP 120/66 | HR 77 | Ht 64.0 in | Wt 111.2 lb

## 2022-10-12 DIAGNOSIS — I4819 Other persistent atrial fibrillation: Secondary | ICD-10-CM

## 2022-10-12 DIAGNOSIS — Z950 Presence of cardiac pacemaker: Secondary | ICD-10-CM

## 2022-10-12 DIAGNOSIS — I495 Sick sinus syndrome: Secondary | ICD-10-CM

## 2022-10-12 DIAGNOSIS — D6869 Other thrombophilia: Secondary | ICD-10-CM | POA: Diagnosis not present

## 2022-10-12 LAB — CUP PACEART INCLINIC DEVICE CHECK
Battery Impedance: 659 Ohm
Battery Remaining Longevity: 72 mo
Battery Voltage: 2.77 V
Brady Statistic AP VP Percent: 1 %
Brady Statistic AP VS Percent: 96 %
Brady Statistic AS VP Percent: 0 %
Brady Statistic AS VS Percent: 3 %
Date Time Interrogation Session: 20240809123747
Implantable Lead Connection Status: 753985
Implantable Lead Connection Status: 753985
Implantable Lead Implant Date: 20180807
Implantable Lead Implant Date: 20180807
Implantable Lead Location: 753859
Implantable Lead Location: 753860
Implantable Lead Model: 5076
Implantable Lead Model: 5076
Implantable Pulse Generator Implant Date: 20180807
Lead Channel Impedance Value: 300 Ohm
Lead Channel Impedance Value: 470 Ohm
Lead Channel Pacing Threshold Amplitude: 0.75 V
Lead Channel Pacing Threshold Amplitude: 0.75 V
Lead Channel Pacing Threshold Amplitude: 0.75 V
Lead Channel Pacing Threshold Amplitude: 0.75 V
Lead Channel Pacing Threshold Pulse Width: 0.4 ms
Lead Channel Pacing Threshold Pulse Width: 0.4 ms
Lead Channel Pacing Threshold Pulse Width: 0.4 ms
Lead Channel Pacing Threshold Pulse Width: 0.4 ms
Lead Channel Sensing Intrinsic Amplitude: 0.5 mV
Lead Channel Sensing Intrinsic Amplitude: 15.67 mV
Lead Channel Setting Pacing Amplitude: 2 V
Lead Channel Setting Pacing Amplitude: 2.5 V
Lead Channel Setting Pacing Pulse Width: 0.4 ms
Lead Channel Setting Sensing Sensitivity: 5.6 mV
Zone Setting Status: 755011
Zone Setting Status: 755011

## 2022-10-12 NOTE — Patient Instructions (Signed)
Medication Instructions:  Your physician recommends that you continue on your current medications as directed. Please refer to the Current Medication list given to you today.  *If you need a refill on your cardiac medications before your next appointment, please call your pharmacy*  Lab Work: None ordered If you have labs (blood work) drawn today and your tests are completely normal, you will receive your results only by: MyChart Message (if you have MyChart) OR A paper copy in the mail If you have any lab test that is abnormal or we need to change your treatment, we will call you to review the results.  Follow-Up: At [facility], you and your health needs are our priority.  As part of our continuing mission to provide you with exceptional heart care, we have created designated Provider Care Teams.  These Care Teams include your primary Cardiologist (physician) and Advanced Practice Providers (APPs -  Physician Assistants and Nurse Practitioners) who all work together to provide you with the care you need, when you need it.  We recommend signing up for the patient portal called "MyChart".  Sign up information is provided on this After Visit Summary.  MyChart is used to connect with patients for Virtual Visits (Telemedicine).  Patients are able to view lab/test results, encounter notes, upcoming appointments, etc.  Non-urgent messages can be sent to your provider as well.   To learn more about what you can do with MyChart, go to [email or web address].    Your next appointment:   1 year(s)  Provider:   Loman Brooklyn, MD

## 2022-10-16 DIAGNOSIS — I4891 Unspecified atrial fibrillation: Secondary | ICD-10-CM | POA: Diagnosis not present

## 2022-10-16 DIAGNOSIS — I10 Essential (primary) hypertension: Secondary | ICD-10-CM | POA: Diagnosis not present

## 2022-10-16 DIAGNOSIS — M7062 Trochanteric bursitis, left hip: Secondary | ICD-10-CM | POA: Diagnosis not present

## 2022-10-16 DIAGNOSIS — M1712 Unilateral primary osteoarthritis, left knee: Secondary | ICD-10-CM | POA: Diagnosis not present

## 2022-10-16 DIAGNOSIS — Z7901 Long term (current) use of anticoagulants: Secondary | ICD-10-CM | POA: Diagnosis not present

## 2022-10-16 DIAGNOSIS — I4892 Unspecified atrial flutter: Secondary | ICD-10-CM | POA: Diagnosis not present

## 2022-10-16 DIAGNOSIS — E038 Other specified hypothyroidism: Secondary | ICD-10-CM | POA: Diagnosis not present

## 2022-10-16 DIAGNOSIS — Z950 Presence of cardiac pacemaker: Secondary | ICD-10-CM | POA: Diagnosis not present

## 2022-10-16 DIAGNOSIS — Z79899 Other long term (current) drug therapy: Secondary | ICD-10-CM | POA: Diagnosis not present

## 2022-10-18 ENCOUNTER — Telehealth: Payer: Self-pay | Admitting: Cardiology

## 2022-10-18 NOTE — Telephone Encounter (Signed)
New Message:      Patient says she would like for [facility] to please give her a call. She says he have some things she needs to discuss with her.

## 2022-10-18 NOTE — Telephone Encounter (Signed)
Spoke to patient she stated she had to cancel appointment with [name] last month.Stated she had covid.She is feeling better.She requested appointment.Appointment scheduled with [name] 9/11 at 10:00 am.

## 2022-10-23 DIAGNOSIS — Z950 Presence of cardiac pacemaker: Secondary | ICD-10-CM | POA: Diagnosis not present

## 2022-10-23 DIAGNOSIS — M7062 Trochanteric bursitis, left hip: Secondary | ICD-10-CM | POA: Diagnosis not present

## 2022-10-23 DIAGNOSIS — Z79899 Other long term (current) drug therapy: Secondary | ICD-10-CM | POA: Diagnosis not present

## 2022-10-23 DIAGNOSIS — M1712 Unilateral primary osteoarthritis, left knee: Secondary | ICD-10-CM | POA: Diagnosis not present

## 2022-10-23 DIAGNOSIS — E038 Other specified hypothyroidism: Secondary | ICD-10-CM | POA: Diagnosis not present

## 2022-10-23 DIAGNOSIS — Z7901 Long term (current) use of anticoagulants: Secondary | ICD-10-CM | POA: Diagnosis not present

## 2022-10-23 DIAGNOSIS — H532 Diplopia: Secondary | ICD-10-CM | POA: Diagnosis not present

## 2022-10-23 DIAGNOSIS — I10 Essential (primary) hypertension: Secondary | ICD-10-CM | POA: Diagnosis not present

## 2022-10-23 DIAGNOSIS — I4892 Unspecified atrial flutter: Secondary | ICD-10-CM | POA: Diagnosis not present

## 2022-10-23 DIAGNOSIS — I4891 Unspecified atrial fibrillation: Secondary | ICD-10-CM | POA: Diagnosis not present

## 2022-10-25 DIAGNOSIS — S51001A Unspecified open wound of right elbow, initial encounter: Secondary | ICD-10-CM | POA: Diagnosis not present

## 2022-10-25 DIAGNOSIS — W1839XA Other fall on same level, initial encounter: Secondary | ICD-10-CM | POA: Diagnosis not present

## 2022-10-25 DIAGNOSIS — S61401A Unspecified open wound of right hand, initial encounter: Secondary | ICD-10-CM | POA: Diagnosis not present

## 2022-10-25 DIAGNOSIS — S81001A Unspecified open wound, right knee, initial encounter: Secondary | ICD-10-CM | POA: Diagnosis not present

## 2022-10-25 DIAGNOSIS — M25521 Pain in right elbow: Secondary | ICD-10-CM | POA: Diagnosis not present

## 2022-10-25 DIAGNOSIS — M25551 Pain in right hip: Secondary | ICD-10-CM | POA: Diagnosis not present

## 2022-10-29 ENCOUNTER — Ambulatory Visit: Payer: Medicare PPO | Admitting: Cardiology

## 2022-10-30 DIAGNOSIS — M7062 Trochanteric bursitis, left hip: Secondary | ICD-10-CM | POA: Diagnosis not present

## 2022-10-30 DIAGNOSIS — Z950 Presence of cardiac pacemaker: Secondary | ICD-10-CM | POA: Diagnosis not present

## 2022-10-30 DIAGNOSIS — I4891 Unspecified atrial fibrillation: Secondary | ICD-10-CM | POA: Diagnosis not present

## 2022-10-30 DIAGNOSIS — Z79899 Other long term (current) drug therapy: Secondary | ICD-10-CM | POA: Diagnosis not present

## 2022-10-30 DIAGNOSIS — Z7901 Long term (current) use of anticoagulants: Secondary | ICD-10-CM | POA: Diagnosis not present

## 2022-10-30 DIAGNOSIS — M1712 Unilateral primary osteoarthritis, left knee: Secondary | ICD-10-CM | POA: Diagnosis not present

## 2022-10-30 DIAGNOSIS — I4892 Unspecified atrial flutter: Secondary | ICD-10-CM | POA: Diagnosis not present

## 2022-10-30 DIAGNOSIS — I10 Essential (primary) hypertension: Secondary | ICD-10-CM | POA: Diagnosis not present

## 2022-10-30 DIAGNOSIS — E038 Other specified hypothyroidism: Secondary | ICD-10-CM | POA: Diagnosis not present

## 2022-11-06 DIAGNOSIS — H532 Diplopia: Secondary | ICD-10-CM | POA: Diagnosis not present

## 2022-11-08 NOTE — Progress Notes (Signed)
[name] Date of Birth: 1930-02-06 Medical Record #[id number]  History of Present Illness: [name] [name] is seen today for follow up CAD and AV stenosis.  She has a history of paroxysmal atrial fibrillation with tachy-brady syndrome and is s/p pacemaker implant. She is on chronic anticoagulation with Eliquis. Echo in July 2015 showed moderate aortic stenosis that had progressed since 2012. Repeat in July 2016 and July 2017 showed no change. In April 2017 she had atrial fibrillation that converted spontaneously. This was felt to be triggered by a steroid injection. Her metoprolol was increased at that time. When seen in June 2017 she was noted to be in persistent Afib. We placed her on Flecainide but this was later stopped due to side effects. She was seen in November with recurrent and persistent Afib. Rate was controlled but she was symptomatic. Seen in Afib clinic and started on amiodarone with subsequent conversion to NSR.   She later developed symptoms of dyspnea.   She had PFTs in December that showed mild obstructive defect with marked decrease in diffusion capacity.  Myoview study was normal. When seen by Dr. Johney Frame in late July it was noted that she had failure of her RA lead. Her device was reprogrammed but she developed pectoral stimulation. She underwent explantation of her old generator and placement of a new left chest dual chamber pacemaker on October 09 2016. Because of her ongoing symptoms we recommended a right and left heart cath and this was done on 11/27/16. This demonstrated severe AS as well as a severe stenosis in the mid LAD. She subsequently underwent successful PCI of the LAD with DES on 12/20/16 with 2.75 x 16 mm Promus stent.   She underwent TAVR on January 29, 2017  with a #23 Edwards Sapien valve. Post op course complicated by pericarditis and AFib with RVR she was treated with anti-inflammatories and Amiodarone. Later Eliquis resumed and ASA stopped. Still on Plavix for  stent. Follow up Echo was Ok. Repeat Echo 01/22/18 still showed good results. Pacemaker check on 12/12/17 showed normal function without atrial high rate episodes. When seen in the valve clinic on 01/22/18 Plavix was discontinued.   She underwent  left breast lumpectomy with Dr Carolynne Edouard for newly diagnosed breast mass on 10/04/20. Initially placed on Letrozole but this was discontinued due to side effects.   She presented to the ED on 07/09/2022 with complaints of vague chest discomfort.  She was noted to be in atrial fibrillation.  Cardiology was consulted.  It was felt that her chest discomfort was likely related to episodes of atrial fibrillation with RVR.  She spontaneously converted to sinus rhythm.  She was advised to take an additional metoprolol as needed for breakthrough atrial fibrillation.  She was discharged home in stable condition.   On follow up today she has rare palpitations. No chest pain or dyspnea. She has noted more edema. Had a fall 3 weeks ago and bruised her side. Went to Urgent care. No fracture. Does have dizziness at times now when she bends over.    Current Outpatient Medications on File Prior to Visit  Medication Sig Dispense Refill   amiodarone (PACERONE) 200 MG tablet TAKE 1/2 TABLET(100 MG) BY MOUTH DAILY 45 tablet 3   apixaban (ELIQUIS) 2.5 MG TABS tablet TAKE 1 TABLET(2.5 MG) BY MOUTH TWICE DAILY 180 tablet 1   calcium carbonate (OS-CAL) 600 MG TABS Take 600 mg by mouth 2 (two) times daily with a meal.     ezetimibe (  ZETIA) 10 MG tablet Take 1 tablet (10 mg total) by mouth daily. TAKE 1 TABLET(10 MG) BY MOUTH DAILY Strength: 10 mg 90 tablet 3   feeding supplement, ENSURE ENLIVE, (ENSURE ENLIVE) LIQD Take 237 mLs by mouth 2 (two) times daily between meals. (Patient taking differently: Take 237 mLs by mouth daily.) 237 mL 12   fluticasone (FLONASE) 50 MCG/ACT nasal spray Place 2 sprays into both nostrils daily as needed for allergies (congestion).     metoprolol tartrate  (LOPRESSOR) 25 MG tablet Take 1.5 tablets (37.5 mg total) by mouth 2 (two) times daily. 270 tablet 3   Multiple Vitamins-Minerals (PRESERVISION AREDS 2) CAPS Take 1 capsule by mouth 2 (two) times daily.     nitroGLYCERIN (NITROSTAT) 0.4 MG SL tablet For chest pain, tightness, or pressure. While sitting, place 1 tablet under tongue. May be used every 5 minutes as needed, for up to 15 minutes. Do not use more than 3 tablets. 25 tablet 12   pantoprazole (PROTONIX) 40 MG tablet Take 1 tablet (40 mg total) by mouth 2 (two) times daily before a meal. 180 tablet 3   polyvinyl alcohol (LIQUIFILM TEARS) 1.4 % ophthalmic solution Place 1-2 drops into both eyes 3 (three) times daily as needed for dry eyes.     SYNTHROID 125 MCG tablet Take 125 mcg by mouth daily before breakfast.      No current facility-administered medications on file prior to visit.    Allergies  Allergen Reactions   Penicillins Anaphylaxis and Other (See Comments)    Has patient had a PCN reaction causing immediate rash, facial/tongue/throat swelling, SOB or lightheadedness with hypotension: Yes Has patient had a PCN reaction causing severe rash involving mucus membranes or skin necrosis: No Has patient had a PCN reaction that required hospitalization: No Has patient had a PCN reaction occurring within the last 10 years: No If all of the above answers are "NO", then may proceed with Cephalosporin use.    Tetanus Toxoid Anaphylaxis   Demerol Nausea Only    Severe nausea   Clindamycin/Lincomycin     Sores in mouth   Letrozole Other (See Comments)   Meningococcal Poly Tetanus Conj Vaccine Acwy Other (See Comments)   Moxifloxacin Hcl Other (See Comments)   Codeine Nausea Only    Severe nausea    Past Medical History:  Diagnosis Date   CKD (chronic kidney disease)    Complication of anesthesia    Coronary artery disease    a. 12/20/2016: s/p DES to LAD    Diverticulosis    Family history of adverse reaction to anesthesia     sister also has post-op n/v   Family history of breast cancer 09/01/2020   GERD (gastroesophageal reflux disease)    Hemorrhoids    Hyperlipidemia    Hypertension    Hypothyroidism    Osteoarthritis    Paroxysmal atrial fibrillation (HCC)    a. on OAC with Xarelto but switched to Eliquis after stenting.    Personal history of skin cancer 09/01/2020   PONV (postoperative nausea and vomiting)    Presence of permanent cardiac pacemaker    S/P TAVR (transcatheter aortic valve replacement) 01/29/2017   23 mm Edwards Sapien 3 transcatheter heart valve placed via percutaneous right transfemoral approach     Past Surgical History:  Procedure Laterality Date   ABDOMINAL HYSTERECTOMY  1980   BASAL CELL CARCINOMA EXCISION     BREAST LUMPECTOMY WITH RADIOACTIVE SEED LOCALIZATION Left 10/04/2020   Procedure: LEFT BREAST LUMPECTOMY  WITH RADIOACTIVE SEED LOCALIZATION;  Surgeon: Griselda Miner, MD;  Location: [facility] OR;  Service: General;  Laterality: Left;   CARDIAC CATHETERIZATION  ~ 11/2016   CATARACT EXTRACTION W/ INTRAOCULAR LENS  IMPLANT, BILATERAL Bilateral    CORONARY ANGIOPLASTY WITH STENT PLACEMENT  12/20/2016   "1 stent"   CORONARY STENT INTERVENTION N/A 12/20/2016   Procedure: CORONARY STENT INTERVENTION;  Surgeon: [place]il[place], Averie Hornbaker M, MD;  Location: [facility] INVASIVE CV LAB;  Service: Cardiovascular;  Laterality: N/A;   FINGER SURGERY Left    "fell; developed skiers thumb; had to operate on it"   INSERT / REPLACE / REMOVE PACEMAKER  1993   for syptomatic bradycardia and syncope -- in [facility]   INTRAMEDULLARY (IM) NAIL INTERTROCHANTERIC Left 10/19/2018   Procedure: INTRAMEDULLARY (IM) NAIL INTERTROCHANTRIC;  Surgeon: Myrene Galas, MD;  Location: [facility] OR;  Service: Orthopedics;  Laterality: Left;   LEAD REVISION/REPAIR N/A 10/09/2016   New left subclavian MDT Adapta L PPM dual chamber system implanted by Dr Johney Frame with previously placed R subclavian system abandoned   PACEMAKER GENERATOR CHANGE   2001   pulse generator replacement by Dr. March Rummage GENERATOR CHANGE  04/01/2008   PPM Medtronic -- model # ADDRL1 serial # [id number] -- pulse generator replacement by Dr. Reyes Ivan    RIGHT/LEFT HEART CATH AND CORONARY ANGIOGRAPHY N/A 11/27/2016   Procedure: RIGHT/LEFT HEART CATH AND CORONARY ANGIOGRAPHY;  Surgeon: [place], Thelma Viana M, MD;  Location: [facility] INVASIVE CV LAB;  Service: Cardiovascular;  Laterality: N/A;   SQUAMOUS CELL CARCINOMA EXCISION     TEE WITHOUT CARDIOVERSION N/A 01/29/2017   Procedure: TRANSESOPHAGEAL ECHOCARDIOGRAM (TEE);  Surgeon: Tonny Bollman, MD;  Location: [facility] OR;  Service: Open Heart Surgery;  Laterality: N/A;   TONSILLECTOMY     TRANSCATHETER AORTIC VALVE REPLACEMENT, TRANSFEMORAL N/A 01/29/2017   Procedure: TRANSCATHETER AORTIC VALVE REPLACEMENT, TRANSFEMORAL;  Surgeon: Tonny Bollman, MD;  Location: [facility] OR;  Service: Open Heart Surgery;  Laterality: N/A;    Social History   Tobacco Use  Smoking Status Former   Current packs/day: 0.00   Average packs/day: 1.5 packs/day for 30.0 years (45.0 ttl pk-yrs)   Types: Cigarettes   Start date: 03/05/1948   Quit date: 03/05/1978   Years since quitting: 44.7  Smokeless Tobacco Never    Social History   Substance and Sexual Activity  Alcohol Use No    Family History  Problem Relation Age of Onset   Alzheimer's disease Mother    Kidney cancer Father        dx 48s   Breast cancer Maternal Aunt 80   Breast cancer Cousin        maternal cousin, dx >50    Review of Systems: As noted in history of present illness.  All other systems were reviewed and are negative.  Physical Exam: BP 122/84   Pulse 67   Ht 5\' 4"  (1.626 m)   Wt 117 lb 3.2 oz (53.2 kg)   SpO2 97%   BMI 20.12 kg/m  GENERAL:  Well appearing, elderly WF in NAD. Walks with cane HEENT:  PERRL, EOMI, sclera are clear. Oropharynx is clear. NECK:  No jugular venous distention, carotid upstroke brisk and symmetric, no bruits, no thyromegaly or  adenopathy LUNGS:  Clear to auscultation bilaterally CHEST:  Unremarkable HEART:  RRR,  PMI not displaced or sustained,S1 and S2 within normal limits, no S3, no S4: no clicks, no rubs, very soft systolic murmur at the apex. ABD:  Soft, nontender. BS +,  no masses or bruits. No hepatomegaly, no splenomegaly EXT:  2 + pulses throughout, 2+ RLE edema 1+ on left. no cyanosis no clubbing SKIN:  Warm and dry.  No rashes NEURO:  Alert and oriented x 3. Cranial nerves II through XII intact. PSYCH:  Cognitively intact   LABORATORY DATA:  Lab Results  Component Value Date   WBC 6.3 07/09/2022   HGB 13.4 07/09/2022   HCT 39.4 07/09/2022   PLT 152 07/09/2022   GLUCOSE 106 (H) 07/09/2022   CHOL 219 (H) 02/05/2018   TRIG 70 02/05/2018   HDL 103 02/05/2018   LDLCALC 102 (H) 02/05/2018   ALT 15 09/19/2021   AST 27 09/19/2021   NA 138 07/09/2022   K 3.9 07/09/2022   CL 100 07/09/2022   CREATININE 1.27 (H) 07/09/2022   BUN 33 (H) 07/09/2022   CO2 28 07/09/2022   TSH 3.890 09/19/2021   INR 1.23 01/29/2017   HGBA1C 5.5 01/28/2017   Labs dated 07/15/15: cholesterol 229, triglycerides 111, HDL 76, LDL 131.  Dated 07/19/16: cholesterol 247, triglycerides 71, HDL 88, LDL 145. BUN 28, creatinine 1.3. Other chemistries, CBC and TSH normal. Dated 03/15/17: creatinine 1.7, Hgb 11.3. Dated 10/22/17: cholesterol 251, triglycerides 66, HDL 93, LDL 145. Hgb 11.7, creatinine 1.3. TSH and ALT normal. Dated 01/12/19: cholesterol 224, triglycerides 105, HDL 96, LDL 107.  Dated 06/15/19: BUN 36, creatinine 1.5. CMET otherwise normal. CBC and TSH normal. Dated 12/26/20: cholesterol 199, triglycerides 51, HDL 90, LDL 99.  Dated 01/31/22: cholesterol 206, triglycerides 53, HDL 96, LDL 99. LFTs normal. TSH and Free T 4 were normal.    Echo 07/26/20: 1. Left ventricular ejection fraction, by estimation, is 60 to 65%. The left ventricle has normal function. The left ventricle has no regional wall motion abnormalities.  Left ventricular diastolic parameters are indeterminate. 2. Right ventricular systolic function is normal. The right ventricular size is normal. 3. Mild mitral valve regurgitation. 4. Tricuspid valve regurgitation is moderate. 5. S/p TAVR (01/29/17) 23 mm Edwards bioprosthesis. Peak and mean gradients through the valve are 17 and 8 mm Hg repcectively AVA (VTI 1.53 cm2) No significant change from gradients in echo of 01/22/18 . Aortic valve regurgitation is mild. Left Ventricle: Left ventricular ejection fraction, by estimation, is 60 to 65%. The left ventricle has normal function. The left ventricle has no regional wall motion abnormalities. The left ventricular internal cavity size was normal in size. There is no left ventricular hypertrophy. Left ventricular diastolic parameters are indeterminate. Right Ventricle: The right ventricular size is normal. Right vetricular wall thickness was not assessed. Right ventricular systolic function is normal. Left Atrium: Left atrial size was normal in size. Right Atrium: Right atrial size was normal in size. Pericardium: There is no evidence of pericardial effusion. Mitral Valve: There is mild thickening of the mitral valve leaflet(s). Mild mitral annular calcification. Mild  Assessment / Plan: 1. Atrial fibrillation with sick sinus syndrome. Prior history of  persistent AFib.  Now on amiodarone 100 mg daily. By pacer check has Afib < 2% of the time.  Continue long-term anticoagulation. On  Eliquis 2.5 mg bid.  Dose adjusted for age and renal function. Continue metoprolol for rate control.   2. Aortic stenosis-   symptomatic. Now s/p TAVR. Stable exam. Echo in June showed mild to mod perivalvular leak. No change in therapy.   3. CAD with significant single vessel obstructive disease in the mid LAD. S/p PCI of the LAD with DES October 2018.  On  Eliquis. No ASA due to bleeding risk.  4. HTN- well controlled.  Will reduce amlodipine to 2.5 mg daily to  avoid hypotension.   5. Hyperlipidemia. Intolerant of statins. Now on Zetia 10 mg daily. LDL 99.   6. Chronic diastolic CHF. She does have increased edema. Recommend she take lasix every day from every other day.   7. S/p femoral neck fracture with ORIF. Recovered.   8. Breast CA. S/p lumpectomy. Monitored by oncology. Did not tolerate antiestrogen therapy.   Follow up in 6 months.

## 2022-11-14 ENCOUNTER — Ambulatory Visit: Payer: Medicare PPO | Attending: Cardiology | Admitting: Cardiology

## 2022-11-14 ENCOUNTER — Encounter: Payer: Self-pay | Admitting: Cardiology

## 2022-11-14 VITALS — BP 122/84 | HR 67 | Ht 64.0 in | Wt 117.2 lb

## 2022-11-14 DIAGNOSIS — I495 Sick sinus syndrome: Secondary | ICD-10-CM

## 2022-11-14 DIAGNOSIS — Z952 Presence of prosthetic heart valve: Secondary | ICD-10-CM

## 2022-11-14 DIAGNOSIS — I2510 Atherosclerotic heart disease of native coronary artery without angina pectoris: Secondary | ICD-10-CM

## 2022-11-14 DIAGNOSIS — I5032 Chronic diastolic (congestive) heart failure: Secondary | ICD-10-CM

## 2022-11-14 DIAGNOSIS — I480 Paroxysmal atrial fibrillation: Secondary | ICD-10-CM

## 2022-11-14 MED ORDER — AMLODIPINE BESYLATE 5 MG PO TABS: 2.5000 mg | ORAL_TABLET | Freq: Every day | ORAL | 3 refills | Status: AC

## 2022-11-14 MED ORDER — FUROSEMIDE 20 MG PO TABS: 20.0000 mg | ORAL_TABLET | Freq: Every day | ORAL | 3 refills | Status: DC

## 2022-11-14 NOTE — Patient Instructions (Addendum)
Medication Instructions:  Decrease Amlodipine to 2.5 mg daily Take Lasix 20 mg daily Continue all other medications *If you need a refill on your cardiac medications before your next appointment, please call your pharmacy*   Lab Work: None ordered   Testing/Procedures: None ordered   Follow-Up: At [facility], you and your health needs are our priority.  As part of our continuing mission to provide you with exceptional heart care, we have created designated Provider Care Teams.  These Care Teams include your primary Cardiologist (physician) and Advanced Practice Providers (APPs -  Physician Assistants and Nurse Practitioners) who all work together to provide you with the care you need, when you need it.  We recommend signing up for the patient portal called "MyChart".  Sign up information is provided on this After Visit Summary.  MyChart is used to connect with patients for Virtual Visits (Telemedicine).  Patients are able to view lab/test results, encounter notes, upcoming appointments, etc.  Non-urgent messages can be sent to your provider as well.   To learn more about what you can do with MyChart, go to [email or web address].    Your next appointment:  6 months   Call in Jan to schedule March appointment     Provider:  [name]

## 2022-11-15 ENCOUNTER — Other Ambulatory Visit: Payer: Self-pay

## 2022-11-15 ENCOUNTER — Telehealth: Payer: Self-pay | Admitting: Cardiology

## 2022-11-15 NOTE — Telephone Encounter (Signed)
Pt is requesting a callback regarding a question she forgot to ask Dr. Swaziland at her office visit so now she'd like to speak with Elnita Maxwell to ask her. Please advise

## 2022-11-15 NOTE — Telephone Encounter (Signed)
Patient ask regarding Lasix to Daily from every other day.  Once she returned home, she states her Right leg was weeping fluid.   Advised would let provider know.  Continue with the Lasix Daily as ordered at yesterdays visit.  Also advised check weight daily. Keep towel under leg when resting.  Kepp open to air if possible. She will call Monday with weights and to let us know how the swelling is.  Please advise if any further recommendations

## 2022-11-19 ENCOUNTER — Telehealth: Payer: Self-pay | Admitting: Cardiology

## 2022-11-19 NOTE — Telephone Encounter (Signed)
Patient called requesting a call back directly from [employer].

## 2022-11-20 NOTE — Telephone Encounter (Signed)
Spoke to patient yesterday she stated she is urinating too much.She has been taking Lasix 20 mg daily.Stated she continues to have swelling in both lower legs.She is concerned taking Lasix every day is bad for her kidneys.She wants to go back to 20 mg every other day.Advised to wear compression socks during the day.I will send message to [name] for advice.

## 2022-11-20 NOTE — Telephone Encounter (Signed)
Spoke to patient [name]'s advice given.Advised to call back if swelling worsens.

## 2022-11-20 NOTE — Telephone Encounter (Signed)
Called patient phone rings busy.

## 2022-11-20 NOTE — Telephone Encounter (Signed)
Called patient no answer.No voice mail.

## 2022-11-26 ENCOUNTER — Telehealth: Payer: Self-pay | Admitting: Cardiology

## 2022-11-26 DIAGNOSIS — M7062 Trochanteric bursitis, left hip: Secondary | ICD-10-CM | POA: Diagnosis not present

## 2022-11-26 DIAGNOSIS — S7001XA Contusion of right hip, initial encounter: Secondary | ICD-10-CM | POA: Diagnosis not present

## 2022-11-26 NOTE — Telephone Encounter (Signed)
Pt c/o medication issue:  1. Name of Medication:  Cortizone  2. How are you currently taking this medication (dosage and times per day)?   3. Are you having a reaction (difficulty breathing--STAT)?   4. What is your medication issue?   Patient would like to know if Cortizone can be taken with her other medications. Please advise.

## 2022-11-26 NOTE — Telephone Encounter (Signed)
Returned call to patient and she states she did not need any help at this time she already received a call back. I did  advise I did not see where we called her back and she stated thanks for calling and hung up

## 2022-11-28 DIAGNOSIS — S51001D Unspecified open wound of right elbow, subsequent encounter: Secondary | ICD-10-CM | POA: Diagnosis not present

## 2022-12-07 ENCOUNTER — Telehealth: Payer: Self-pay | Admitting: *Deleted

## 2022-12-07 DIAGNOSIS — H532 Diplopia: Secondary | ICD-10-CM | POA: Diagnosis not present

## 2022-12-07 NOTE — Telephone Encounter (Signed)
Received call from pt with complaint of tenderness in left breast x3 days.  Pt denies any redness, swelling or warmth to breast.  Pt also denies any recent injury or trauma. Pt requesting breast exam for further evaluation. [facility] visit scheduled and verbalized understanding.

## 2022-12-13 ENCOUNTER — Encounter: Payer: Self-pay | Admitting: Adult Health

## 2022-12-13 ENCOUNTER — Inpatient Hospital Stay: Payer: Medicare PPO | Attending: Adult Health | Admitting: Adult Health

## 2022-12-13 VITALS — BP 137/55 | HR 65 | Temp 97.9°F | Resp 17 | Wt 112.2 lb

## 2022-12-13 DIAGNOSIS — Z87891 Personal history of nicotine dependence: Secondary | ICD-10-CM | POA: Insufficient documentation

## 2022-12-13 DIAGNOSIS — N644 Mastodynia: Secondary | ICD-10-CM | POA: Diagnosis not present

## 2022-12-13 DIAGNOSIS — Z8051 Family history of malignant neoplasm of kidney: Secondary | ICD-10-CM | POA: Insufficient documentation

## 2022-12-13 DIAGNOSIS — Z853 Personal history of malignant neoplasm of breast: Secondary | ICD-10-CM | POA: Insufficient documentation

## 2022-12-13 DIAGNOSIS — C50412 Malignant neoplasm of upper-outer quadrant of left female breast: Secondary | ICD-10-CM

## 2022-12-13 DIAGNOSIS — Z170 Estrogen receptor positive status [ER+]: Secondary | ICD-10-CM | POA: Diagnosis not present

## 2022-12-13 NOTE — Assessment & Plan Note (Signed)
June 2022:Screening detected left breast mass 0.8 cm, axilla negative, biopsy revealed grade 1 IDC ER 95%, PR 1%, HER2 equivocal by IHC, FISH negative, Ki-67 5%   10/04/2020:Left lumpectomy (Dr. Chevis Pretty): 0.9 cm, grade 1 invasive ductal carcinoma with resection margins negative for carcinoma   Treatment plan: 1.  Given the favorable type of breast cancer and the size and negative margins, we decided not to pursue radiation. 2.  Adjuvant antiestrogen therapy with letrozole 2.5 mg daily (stopped because of side effects) We decided not to prescribe her any other antiestrogen treatments given her age and her side effects to letrozole.   Breast Cancer Surveillance: 1. Breast exam 12/13/2022: Normal  She is overdue for her mammogram.  She will undergo a bilateral breast diagnostic mammogram and left breast ultrasound to evaluate the focal breast tenderness she is feeling at her lumpectomy site.  Her breast exam today is normal.  She will return in April 2024 for follow-up with Dr. Pamelia Hoit.

## 2022-12-13 NOTE — Progress Notes (Signed)
[facility] Cancer Follow up:    [name] Corn, MD [address] [place] [address]   DIAGNOSIS:  Cancer Staging  Malignant neoplasm of upper-outer quadrant of left breast in female, estrogen receptor positive (HCC) Staging form: Breast, AJCC 8th Edition - Clinical stage from 08/31/2020: Stage IA (cT1b, cN0, cM0, G1, ER+, PR+, HER2-) - Signed by Serena Croissant, MD on 08/31/2020 Stage prefix: Initial diagnosis Histologic grading system: 3 grade system - Pathologic stage from 10/04/2020: Stage IA (pT1b, pN0, cM0, G1, ER+, PR+, HER2-) - Signed by Loa Socks, NP on 01/15/2021 Stage prefix: Initial diagnosis Histologic grading system: 3 grade system   SUMMARY OF ONCOLOGIC HISTORY: Oncology History  Malignant neoplasm of upper-outer quadrant of left breast in female, estrogen receptor positive (HCC)  08/26/2020 Initial Diagnosis   Screening detected left breast mass 0.8 cm, axilla negative, biopsy revealed grade 1 IDC ER 95%, PR 1%, HER2 equivocal by IHC, FISH negative, Ki-67 5%   08/31/2020 Cancer Staging   Staging form: Breast, AJCC 8th Edition - Clinical stage from 08/31/2020: Stage IA (cT1b, cN0, cM0, G1, ER+, PR+, HER2-) - Signed by Serena Croissant, MD on 08/31/2020 Stage prefix: Initial diagnosis Histologic grading system: 3 grade system   09/13/2020 Genetic Testing   Negative.  Genes Tested include: APC, ATM, AXIN2, BARD1, BMPR1A, BRCA1, BRCA2, BRIP1, CDH1, CDK4, CDKN2A, CHEK2, DICER1, EPCAM, GREM1, HOXB13, MEN1, MLH1, MSH2, MSH3, MSH6, MUTYH, NBN, NF1, NF2, NTHL1, PALB2, PMS2, POLD1, POLE, PTEN, RAD51C, RAD51D, RECQL, RET, SDHA, SDHAF2, SDHB, SDHC, SDHD, SMAD4, SMARCA4, STK11, TP53, TSC1, TSC2, and VHL.    10/04/2020 Surgery   Left lumpectomy (Dr. Griselda Miner): 0.9 cm, grade 1 invasive ductal carcinoma with resection margins negative for carcinoma   10/04/2020 Cancer Staging   Staging form: Breast, AJCC 8th Edition - Pathologic stage from 10/04/2020: Stage IA  (pT1b, pN0, cM0, G1, ER+, PR+, HER2-) - Signed by Loa Socks, NP on 01/15/2021 Stage prefix: Initial diagnosis Histologic grading system: 3 grade system     CURRENT THERAPY: observation  INTERVAL HISTORY: [name] 89 y.o. female returns for follow-up and evaluation of left breast lumpectomy pain that has been going on for the past 2 weeks.  She denies any nodules or lymphadenopathy.  She denies any breast erythema swelling or other tenderness throughout her breasts.  She was due for bilateral breast mammogram in August that has not yet been completed at [facility].  Her most recent mammogram and ultrasound occurred on April 20, 2022 of the left breast that was negative.  He also underwent a bilateral breast diagnostic mammogram on October 05, 2021 that showed no mammographic evidence of malignancy and breast density category B.   Patient Active Problem List   Diagnosis Date Noted   Hypercoagulable state due to persistent atrial fibrillation (HCC) 07/25/2022   Genetic testing 11/03/2020   Family history of breast cancer 09/01/2020   Personal history of skin cancer 09/01/2020   Malignant neoplasm of upper-outer quadrant of left breast in female, estrogen receptor positive (HCC) 08/26/2020   Hip fracture (HCC) 10/19/2018   Sick sinus syndrome (HCC)    Presence of permanent cardiac pacemaker    Paroxysmal atrial fibrillation (HCC)    Osteoarthritis    Hypothyroidism    Hypertension    Hyperlipidemia    Hemorrhoids    GERD (gastroesophageal reflux disease)    Diverticulosis    Coronary artery disease    Complication of anesthesia    Aortic stenosis, severe  Pericarditis 01/31/2017   Acute on chronic diastolic heart failure (HCC) 01/30/2017   Acute blood loss as cause of postoperative anemia 01/30/2017   S/P TAVR (transcatheter aortic valve replacement) 01/29/2017   Anemia    CKD (chronic kidney disease)    CAD in native artery 12/20/2016   Anticoagulated  05/31/2016   Pacemaker-Medtronic 01/01/2012   Long term current use of anticoagulant therapy 06/18/2011   Severe aortic stenosis 09/01/2010   BRADYCARDIA-TACHYCARDIA SYNDROME 03/31/2010   HLD (hyperlipidemia) 03/30/2010   Essential hypertension 03/30/2010   ATRIAL FIBRILLATION 03/30/2010   Osteoarthritis 03/30/2010    is allergic to penicillins, tetanus toxoid, demerol, clindamycin/lincomycin, letrozole, meningococcal poly tetanus conj vaccine acwy, moxifloxacin hcl, and codeine.  MEDICAL HISTORY: Past Medical History:  Diagnosis Date   CKD (chronic kidney disease)    Complication of anesthesia    Coronary artery disease    a. 12/20/2016: s/p DES to LAD    Diverticulosis    Family history of adverse reaction to anesthesia    sister also has post-op n/v   Family history of breast cancer 09/01/2020   GERD (gastroesophageal reflux disease)    Hemorrhoids    Hyperlipidemia    Hypertension    Hypothyroidism    Osteoarthritis    Paroxysmal atrial fibrillation (HCC)    a. on OAC with Xarelto but switched to Eliquis after stenting.    Personal history of skin cancer 09/01/2020   PONV (postoperative nausea and vomiting)    Presence of permanent cardiac pacemaker    S/P TAVR (transcatheter aortic valve replacement) 01/29/2017   23 mm Edwards Sapien 3 transcatheter heart valve placed via percutaneous right transfemoral approach     SURGICAL HISTORY: Past Surgical History:  Procedure Laterality Date   ABDOMINAL HYSTERECTOMY  1980   BASAL CELL CARCINOMA EXCISION     BREAST LUMPECTOMY WITH RADIOACTIVE SEED LOCALIZATION Left 10/04/2020   Procedure: LEFT BREAST LUMPECTOMY WITH RADIOACTIVE SEED LOCALIZATION;  Surgeon: Griselda Miner, MD;  Location: [facility] OR;  Service: General;  Laterality: Left;   CARDIAC CATHETERIZATION  ~ 11/2016   CATARACT EXTRACTION W/ INTRAOCULAR LENS  IMPLANT, BILATERAL Bilateral    CORONARY ANGIOPLASTY WITH STENT PLACEMENT  12/20/2016   "1 stent"   CORONARY STENT  INTERVENTION N/A 12/20/2016   Procedure: CORONARY STENT INTERVENTION;  Surgeon: Swaziland, Peter M, MD;  Location: [facility] INVASIVE CV LAB;  Service: Cardiovascular;  Laterality: N/A;   FINGER SURGERY Left    "fell; developed skiers thumb; had to operate on it"   INSERT / REPLACE / REMOVE PACEMAKER  1993   for syptomatic bradycardia and syncope -- in [facility]   INTRAMEDULLARY (IM) NAIL INTERTROCHANTERIC Left 10/19/2018   Procedure: INTRAMEDULLARY (IM) NAIL INTERTROCHANTRIC;  Surgeon: Myrene Galas, MD;  Location: [facility] OR;  Service: Orthopedics;  Laterality: Left;   LEAD REVISION/REPAIR N/A 10/09/2016   New left subclavian MDT Adapta L PPM dual chamber system implanted by Dr Johney Frame with previously placed R subclavian system abandoned   PACEMAKER GENERATOR CHANGE  2001   pulse generator replacement by Dr. March Rummage GENERATOR CHANGE  04/01/2008   PPM Medtronic -- model # ADDRL1 serial # [id number] -- pulse generator replacement by Dr. Reyes Ivan    RIGHT/LEFT HEART CATH AND CORONARY ANGIOGRAPHY N/A 11/27/2016   Procedure: RIGHT/LEFT HEART CATH AND CORONARY ANGIOGRAPHY;  Surgeon: Swaziland, Peter M, MD;  Location[facility] INVASIVE CV LAB;  Service: Cardiovascular;  Laterality: N/A;   SQUAMOUS CELL CARCINOMA EXCISION     TEE WITHOUT CARDIOVERSION  N/A 01/29/2017   Procedure: TRANSESOPHAGEAL ECHOCARDIOGRAM (TEE);  Surgeon: Tonny Bollman, MD;  Location: [facility] OR;  Service: Open Heart Surgery;  Laterality: N/A;   TONSILLECTOMY     TRANSCATHETER AORTIC VALVE REPLACEMENT, TRANSFEMORAL N/A 01/29/2017   Procedure: TRANSCATHETER AORTIC VALVE REPLACEMENT, TRANSFEMORAL;  Surgeon: Tonny Bollman, MD;  Location: [facility] OR;  Service: Open Heart Surgery;  Laterality: N/A;    SOCIAL HISTORY: Social History   Socioeconomic History   Marital status: Widowed    Spouse name: Not on file   Number of children: 0   Years of education: Not on file   Highest education level: Not on file  Occupational History    Employer:  RETIRED  Tobacco Use   Smoking status: Former    Current packs/day: 0.00    Average packs/day: 1.5 packs/day for 30.0 years (45.0 ttl pk-yrs)    Types: Cigarettes    Start date: 03/05/1948    Quit date: 03/05/1978    Years since quitting: 44.8   Smokeless tobacco: Never  Vaping Use   Vaping status: Never Used  Substance and Sexual Activity   Alcohol use: No   Drug use: No   Sexual activity: Not on file  Other Topics Concern   Not on file  Social History Narrative   Not on file   Social Determinants of Health   Financial Resource Strain: Low Risk  (01/16/2021)   Overall Financial Resource Strain (CARDIA)    Difficulty of Paying Living Expenses: Not hard at all  Food Insecurity: No Food Insecurity (01/16/2021)   Hunger Vital Sign    Worried About Running Out of Food in the Last Year: Never true    Ran Out of Food in the Last Year: Never true  Transportation Needs: No Transportation Needs (01/16/2021)   PRAPARE - Administrator, Civil Service (Medical): No    Lack of Transportation (Non-Medical): No  Physical Activity: Insufficiently Active (01/16/2021)   Exercise Vital Sign    Days of Exercise per Week: 2 days    Minutes of Exercise per Session: 30 min  Stress: No Stress Concern Present (01/16/2021)   [employer] of Occupational Health - Occupational Stress Questionnaire    Feeling of Stress : Not at all  Social Connections: Moderately Isolated (01/16/2021)   Social Connection and Isolation Panel [NHANES]    Frequency of Communication with Friends and Family: Three times a week    Frequency of Social Gatherings with Friends and Family: Twice a week    Attends Religious Services: 1 to 4 times per year    Active Member of [employer] or Organizations: No    Attends Banker Meetings: Never    Marital Status: Widowed  Intimate Partner Violence: Not At Risk (01/16/2021)   Humiliation, Afraid, Rape, and Kick questionnaire    Fear of Current or  Ex-Partner: No    Emotionally Abused: No    Physically Abused: No    Sexually Abused: No    FAMILY HISTORY: Family History  Problem Relation Age of Onset   Alzheimer's disease Mother    Kidney cancer Father        dx 40s   Breast cancer Maternal Aunt 49   Breast cancer Cousin        maternal cousin, dx >50    Review of Systems  Constitutional:  Negative for appetite change, chills, fatigue, fever and unexpected weight change.  HENT:   Negative for hearing loss, lump/mass and trouble swallowing.   Eyes:  Negative for eye problems and icterus.  Respiratory:  Negative for chest tightness, cough and shortness of breath.   Cardiovascular:  Negative for chest pain, leg swelling and palpitations.  Gastrointestinal:  Negative for abdominal distention, abdominal pain, constipation, diarrhea, nausea and vomiting.  Endocrine: Negative for hot flashes.  Genitourinary:  Negative for difficulty urinating.   Musculoskeletal:  Negative for arthralgias.  Skin:  Negative for itching and rash.  Neurological:  Negative for dizziness, extremity weakness, headaches and numbness.  Hematological:  Negative for adenopathy. Does not bruise/bleed easily.  Psychiatric/Behavioral:  Negative for depression. The patient is not nervous/anxious.       PHYSICAL EXAMINATION   Onc Performance Status - 12/13/22 1202       ECOG Perf Status   ECOG Perf Status Ambulatory and capable of all selfcare but unable to carry out any work activities.  Up and about more than 50% of waking hours      KPS SCALE   KPS % SCORE Normal activity with effort, some s/s of disease             Vitals:   12/13/22 1152  BP: (!) 137/55  Pulse: 65  Resp: 17  Temp: 97.9 F (36.6 C)  SpO2: 98%    Physical Exam Constitutional:      General: She is not in acute distress.    Appearance: Normal appearance. She is not toxic-appearing.  HENT:     Head: Normocephalic and atraumatic.     Mouth/Throat:     Mouth: Mucous  membranes are moist.     Pharynx: Oropharynx is clear. No oropharyngeal exudate or posterior oropharyngeal erythema.  Eyes:     General: No scleral icterus. Cardiovascular:     Rate and Rhythm: Normal rate and regular rhythm.     Pulses: Normal pulses.     Heart sounds: Normal heart sounds.  Pulmonary:     Effort: Pulmonary effort is normal.     Breath sounds: Normal breath sounds.  Chest:     Comments: Right breast is benign, left breast status postlumpectomy no sign of local recurrence. Abdominal:     General: Abdomen is flat. Bowel sounds are normal. There is no distension.     Palpations: Abdomen is soft.     Tenderness: There is no abdominal tenderness.  Musculoskeletal:        General: No swelling.     Cervical back: Neck supple.  Lymphadenopathy:     Cervical: No cervical adenopathy.  Skin:    General: Skin is warm and dry.     Findings: No rash.  Neurological:     General: No focal deficit present.     Mental Status: She is alert.  Psychiatric:        Mood and Affect: Mood normal.        Behavior: Behavior normal.      ASSESSMENT and THERAPY PLAN:   Malignant neoplasm of upper-outer quadrant of left breast in female, estrogen receptor positive (HCC) June 2022:Screening detected left breast mass 0.8 cm, axilla negative, biopsy revealed grade 1 IDC ER 95%, PR 1%, HER2 equivocal by IHC, FISH negative, Ki-67 5%   10/04/2020:Left lumpectomy (Dr. Chevis Pretty): 0.9 cm, grade 1 invasive ductal carcinoma with resection margins negative for carcinoma   Treatment plan: 1.  Given the favorable type of breast cancer and the size and negative margins, we decided not to pursue radiation. 2.  Adjuvant antiestrogen therapy with letrozole 2.5 mg daily (stopped because of side effects)  We decided not to prescribe her any other antiestrogen treatments given her age and her side effects to letrozole.   Breast Cancer Surveillance: 1. Breast exam 12/13/2022: Normal  She is overdue for  her mammogram.  She will undergo a bilateral breast diagnostic mammogram and left breast ultrasound to evaluate the focal breast tenderness she is feeling at her lumpectomy site.  Her breast exam today is normal.  She will return in April 2024 for follow-up with Dr. Pamelia Hoit.     All questions were answered. The patient knows to call the clinic with any problems, questions or concerns. We can certainly see the patient much sooner if necessary.  Total encounter time:20 minutes*in face-to-face visit time, chart review, lab review, care coordination, order entry, and documentation of the encounter time.    Lillard Anes, NP 12/13/22 1:05 PM Medical Oncology and Hematology [facility] [address] [place], [place] [address] Tel. [phone]    Fax. [phone]  *Total Encounter Time as defined by the Centers for Medicare and Medicaid Services includes, in addition to the face-to-face time of a patient visit (documented in the note above) non-face-to-face time: obtaining and reviewing outside history, ordering and reviewing medications, tests or procedures, care coordination (communications with other health care professionals or caregivers) and documentation in the medical record.

## 2022-12-18 ENCOUNTER — Telehealth: Payer: Self-pay

## 2022-12-18 NOTE — Telephone Encounter (Signed)
Faxed demographics to [facility] [phone]

## 2022-12-19 DIAGNOSIS — M7062 Trochanteric bursitis, left hip: Secondary | ICD-10-CM | POA: Diagnosis not present

## 2022-12-24 ENCOUNTER — Other Ambulatory Visit: Payer: Self-pay | Admitting: Cardiology

## 2022-12-24 ENCOUNTER — Telehealth: Payer: Self-pay | Admitting: Cardiology

## 2022-12-24 NOTE — Telephone Encounter (Signed)
Spoke with pt, she confirms she is taking 100 mg of amiodarone once daily. She is unable to find the bottle that the pharmacy says she has already gotten. Pharmacy called and given okay to refill early.

## 2022-12-24 NOTE — Telephone Encounter (Signed)
Pt c/o medication issue:  1. Name of Medication:   amiodarone (PACERONE) 200 MG tablet   2. How are you currently taking this medication (dosage and times per day)? As prescribed   3. Are you having a reaction (difficulty breathing--STAT)? No   4. What is your medication issue? Patient is calling stating the pharmacy advised her she just got medication refilled, but she does not recall getting a refill and only has 5 tablets left. She was advised to let the office know in order to get authorization for an early refill. Please advise.

## 2022-12-25 ENCOUNTER — Telehealth: Payer: Self-pay

## 2022-12-25 ENCOUNTER — Other Ambulatory Visit: Payer: Self-pay

## 2022-12-25 ENCOUNTER — Ambulatory Visit: Payer: Medicare PPO

## 2022-12-25 DIAGNOSIS — I130 Hypertensive heart and chronic kidney disease with heart failure and stage 1 through stage 4 chronic kidney disease, or unspecified chronic kidney disease: Secondary | ICD-10-CM | POA: Diagnosis not present

## 2022-12-25 DIAGNOSIS — I509 Heart failure, unspecified: Secondary | ICD-10-CM | POA: Diagnosis not present

## 2022-12-25 DIAGNOSIS — M1712 Unilateral primary osteoarthritis, left knee: Secondary | ICD-10-CM | POA: Diagnosis not present

## 2022-12-25 DIAGNOSIS — M7062 Trochanteric bursitis, left hip: Secondary | ICD-10-CM | POA: Diagnosis not present

## 2022-12-25 DIAGNOSIS — J449 Chronic obstructive pulmonary disease, unspecified: Secondary | ICD-10-CM | POA: Diagnosis not present

## 2022-12-25 DIAGNOSIS — S300XXD Contusion of lower back and pelvis, subsequent encounter: Secondary | ICD-10-CM | POA: Diagnosis not present

## 2022-12-25 DIAGNOSIS — I4891 Unspecified atrial fibrillation: Secondary | ICD-10-CM | POA: Diagnosis not present

## 2022-12-25 DIAGNOSIS — E785 Hyperlipidemia, unspecified: Secondary | ICD-10-CM | POA: Diagnosis not present

## 2022-12-25 DIAGNOSIS — N183 Chronic kidney disease, stage 3 unspecified: Secondary | ICD-10-CM | POA: Diagnosis not present

## 2022-12-25 NOTE — Telephone Encounter (Signed)
Pt called in stating that her monitor was not working. We called tech support and they are sending her a new monitor and will receive it 7-10 business days. Pt will call when she gets it to make sure that it is working

## 2022-12-26 DIAGNOSIS — I130 Hypertensive heart and chronic kidney disease with heart failure and stage 1 through stage 4 chronic kidney disease, or unspecified chronic kidney disease: Secondary | ICD-10-CM | POA: Diagnosis not present

## 2022-12-26 DIAGNOSIS — E785 Hyperlipidemia, unspecified: Secondary | ICD-10-CM | POA: Diagnosis not present

## 2022-12-26 DIAGNOSIS — M1712 Unilateral primary osteoarthritis, left knee: Secondary | ICD-10-CM | POA: Diagnosis not present

## 2022-12-26 DIAGNOSIS — N183 Chronic kidney disease, stage 3 unspecified: Secondary | ICD-10-CM | POA: Diagnosis not present

## 2022-12-26 DIAGNOSIS — M7062 Trochanteric bursitis, left hip: Secondary | ICD-10-CM | POA: Diagnosis not present

## 2022-12-26 DIAGNOSIS — J449 Chronic obstructive pulmonary disease, unspecified: Secondary | ICD-10-CM | POA: Diagnosis not present

## 2022-12-26 DIAGNOSIS — I4891 Unspecified atrial fibrillation: Secondary | ICD-10-CM | POA: Diagnosis not present

## 2022-12-26 DIAGNOSIS — I509 Heart failure, unspecified: Secondary | ICD-10-CM | POA: Diagnosis not present

## 2022-12-26 DIAGNOSIS — S300XXD Contusion of lower back and pelvis, subsequent encounter: Secondary | ICD-10-CM | POA: Diagnosis not present

## 2022-12-27 DIAGNOSIS — Z23 Encounter for immunization: Secondary | ICD-10-CM | POA: Diagnosis not present

## 2022-12-31 DIAGNOSIS — I509 Heart failure, unspecified: Secondary | ICD-10-CM | POA: Diagnosis not present

## 2022-12-31 DIAGNOSIS — S300XXD Contusion of lower back and pelvis, subsequent encounter: Secondary | ICD-10-CM | POA: Diagnosis not present

## 2022-12-31 DIAGNOSIS — M1712 Unilateral primary osteoarthritis, left knee: Secondary | ICD-10-CM | POA: Diagnosis not present

## 2022-12-31 DIAGNOSIS — I130 Hypertensive heart and chronic kidney disease with heart failure and stage 1 through stage 4 chronic kidney disease, or unspecified chronic kidney disease: Secondary | ICD-10-CM | POA: Diagnosis not present

## 2022-12-31 DIAGNOSIS — I4891 Unspecified atrial fibrillation: Secondary | ICD-10-CM | POA: Diagnosis not present

## 2022-12-31 DIAGNOSIS — N183 Chronic kidney disease, stage 3 unspecified: Secondary | ICD-10-CM | POA: Diagnosis not present

## 2022-12-31 DIAGNOSIS — J449 Chronic obstructive pulmonary disease, unspecified: Secondary | ICD-10-CM | POA: Diagnosis not present

## 2022-12-31 DIAGNOSIS — M7062 Trochanteric bursitis, left hip: Secondary | ICD-10-CM | POA: Diagnosis not present

## 2022-12-31 DIAGNOSIS — E785 Hyperlipidemia, unspecified: Secondary | ICD-10-CM | POA: Diagnosis not present

## 2023-01-02 DIAGNOSIS — Z853 Personal history of malignant neoplasm of breast: Secondary | ICD-10-CM | POA: Diagnosis not present

## 2023-01-02 DIAGNOSIS — N644 Mastodynia: Secondary | ICD-10-CM | POA: Diagnosis not present

## 2023-01-03 DIAGNOSIS — I509 Heart failure, unspecified: Secondary | ICD-10-CM | POA: Diagnosis not present

## 2023-01-03 DIAGNOSIS — M7062 Trochanteric bursitis, left hip: Secondary | ICD-10-CM | POA: Diagnosis not present

## 2023-01-03 DIAGNOSIS — J449 Chronic obstructive pulmonary disease, unspecified: Secondary | ICD-10-CM | POA: Diagnosis not present

## 2023-01-03 DIAGNOSIS — I130 Hypertensive heart and chronic kidney disease with heart failure and stage 1 through stage 4 chronic kidney disease, or unspecified chronic kidney disease: Secondary | ICD-10-CM | POA: Diagnosis not present

## 2023-01-03 DIAGNOSIS — S300XXD Contusion of lower back and pelvis, subsequent encounter: Secondary | ICD-10-CM | POA: Diagnosis not present

## 2023-01-03 DIAGNOSIS — I4891 Unspecified atrial fibrillation: Secondary | ICD-10-CM | POA: Diagnosis not present

## 2023-01-03 DIAGNOSIS — N183 Chronic kidney disease, stage 3 unspecified: Secondary | ICD-10-CM | POA: Diagnosis not present

## 2023-01-03 DIAGNOSIS — E785 Hyperlipidemia, unspecified: Secondary | ICD-10-CM | POA: Diagnosis not present

## 2023-01-03 DIAGNOSIS — M1712 Unilateral primary osteoarthritis, left knee: Secondary | ICD-10-CM | POA: Diagnosis not present

## 2023-01-04 ENCOUNTER — Encounter: Payer: Self-pay | Admitting: Adult Health

## 2023-01-07 DIAGNOSIS — M1712 Unilateral primary osteoarthritis, left knee: Secondary | ICD-10-CM | POA: Diagnosis not present

## 2023-01-07 DIAGNOSIS — E785 Hyperlipidemia, unspecified: Secondary | ICD-10-CM | POA: Diagnosis not present

## 2023-01-07 DIAGNOSIS — I4891 Unspecified atrial fibrillation: Secondary | ICD-10-CM | POA: Diagnosis not present

## 2023-01-07 DIAGNOSIS — J449 Chronic obstructive pulmonary disease, unspecified: Secondary | ICD-10-CM | POA: Diagnosis not present

## 2023-01-07 DIAGNOSIS — I509 Heart failure, unspecified: Secondary | ICD-10-CM | POA: Diagnosis not present

## 2023-01-07 DIAGNOSIS — N183 Chronic kidney disease, stage 3 unspecified: Secondary | ICD-10-CM | POA: Diagnosis not present

## 2023-01-07 DIAGNOSIS — S300XXD Contusion of lower back and pelvis, subsequent encounter: Secondary | ICD-10-CM | POA: Diagnosis not present

## 2023-01-07 DIAGNOSIS — I130 Hypertensive heart and chronic kidney disease with heart failure and stage 1 through stage 4 chronic kidney disease, or unspecified chronic kidney disease: Secondary | ICD-10-CM | POA: Diagnosis not present

## 2023-01-07 DIAGNOSIS — M7062 Trochanteric bursitis, left hip: Secondary | ICD-10-CM | POA: Diagnosis not present

## 2023-01-08 ENCOUNTER — Telehealth: Payer: Self-pay | Admitting: Cardiology

## 2023-01-08 NOTE — Telephone Encounter (Signed)
Spoke with patient and she states she feels she is going to the bathroom too much. I did inform her that is what the lasix is supposed to do. She just wanted to make sure she needs to take today as ordered. Advised to take medication as prescribed by provider she verbalized understanding

## 2023-01-08 NOTE — Telephone Encounter (Signed)
Pt c/o medication issue:  1. Name of Medication:   furosemide (LASIX) 20 MG tablet    2. How are you currently taking this medication (dosage and times per day)?  Take 20 mg every other day.May take 20 mg every day if swelling worsens       3. Are you having a reaction (difficulty breathing--STAT)? No    4. What is your medication issue? Pt is requesting a callback regarding this medication causing her to have to go to the bathroom a lot. Please advise

## 2023-01-10 ENCOUNTER — Other Ambulatory Visit: Payer: Self-pay | Admitting: Internal Medicine

## 2023-01-10 DIAGNOSIS — R42 Dizziness and giddiness: Secondary | ICD-10-CM | POA: Diagnosis not present

## 2023-01-10 DIAGNOSIS — M1712 Unilateral primary osteoarthritis, left knee: Secondary | ICD-10-CM | POA: Diagnosis not present

## 2023-01-10 DIAGNOSIS — I130 Hypertensive heart and chronic kidney disease with heart failure and stage 1 through stage 4 chronic kidney disease, or unspecified chronic kidney disease: Secondary | ICD-10-CM | POA: Diagnosis not present

## 2023-01-10 DIAGNOSIS — R251 Tremor, unspecified: Secondary | ICD-10-CM | POA: Diagnosis not present

## 2023-01-10 DIAGNOSIS — I4891 Unspecified atrial fibrillation: Secondary | ICD-10-CM | POA: Diagnosis not present

## 2023-01-10 DIAGNOSIS — E785 Hyperlipidemia, unspecified: Secondary | ICD-10-CM | POA: Diagnosis not present

## 2023-01-10 DIAGNOSIS — J449 Chronic obstructive pulmonary disease, unspecified: Secondary | ICD-10-CM | POA: Diagnosis not present

## 2023-01-10 DIAGNOSIS — R634 Abnormal weight loss: Secondary | ICD-10-CM

## 2023-01-10 DIAGNOSIS — S300XXD Contusion of lower back and pelvis, subsequent encounter: Secondary | ICD-10-CM | POA: Diagnosis not present

## 2023-01-10 DIAGNOSIS — I509 Heart failure, unspecified: Secondary | ICD-10-CM | POA: Diagnosis not present

## 2023-01-10 DIAGNOSIS — N183 Chronic kidney disease, stage 3 unspecified: Secondary | ICD-10-CM | POA: Diagnosis not present

## 2023-01-10 DIAGNOSIS — R296 Repeated falls: Secondary | ICD-10-CM | POA: Diagnosis not present

## 2023-01-10 DIAGNOSIS — I480 Paroxysmal atrial fibrillation: Secondary | ICD-10-CM | POA: Diagnosis not present

## 2023-01-10 DIAGNOSIS — M7062 Trochanteric bursitis, left hip: Secondary | ICD-10-CM | POA: Diagnosis not present

## 2023-01-10 DIAGNOSIS — D692 Other nonthrombocytopenic purpura: Secondary | ICD-10-CM | POA: Diagnosis not present

## 2023-01-10 DIAGNOSIS — G319 Degenerative disease of nervous system, unspecified: Secondary | ICD-10-CM | POA: Diagnosis not present

## 2023-01-10 DIAGNOSIS — Z7901 Long term (current) use of anticoagulants: Secondary | ICD-10-CM | POA: Diagnosis not present

## 2023-01-14 ENCOUNTER — Other Ambulatory Visit: Payer: Medicare PPO

## 2023-01-14 ENCOUNTER — Ambulatory Visit: Payer: Medicare PPO | Admitting: Cardiology

## 2023-01-14 ENCOUNTER — Encounter: Payer: Self-pay | Admitting: Internal Medicine

## 2023-01-16 ENCOUNTER — Ambulatory Visit: Payer: Medicare PPO

## 2023-01-16 DIAGNOSIS — I495 Sick sinus syndrome: Secondary | ICD-10-CM

## 2023-01-17 DIAGNOSIS — M7062 Trochanteric bursitis, left hip: Secondary | ICD-10-CM | POA: Diagnosis not present

## 2023-01-17 DIAGNOSIS — I4891 Unspecified atrial fibrillation: Secondary | ICD-10-CM | POA: Diagnosis not present

## 2023-01-17 DIAGNOSIS — S300XXD Contusion of lower back and pelvis, subsequent encounter: Secondary | ICD-10-CM | POA: Diagnosis not present

## 2023-01-17 DIAGNOSIS — I509 Heart failure, unspecified: Secondary | ICD-10-CM | POA: Diagnosis not present

## 2023-01-17 DIAGNOSIS — M1712 Unilateral primary osteoarthritis, left knee: Secondary | ICD-10-CM | POA: Diagnosis not present

## 2023-01-17 DIAGNOSIS — I130 Hypertensive heart and chronic kidney disease with heart failure and stage 1 through stage 4 chronic kidney disease, or unspecified chronic kidney disease: Secondary | ICD-10-CM | POA: Diagnosis not present

## 2023-01-17 DIAGNOSIS — E785 Hyperlipidemia, unspecified: Secondary | ICD-10-CM | POA: Diagnosis not present

## 2023-01-17 DIAGNOSIS — N183 Chronic kidney disease, stage 3 unspecified: Secondary | ICD-10-CM | POA: Diagnosis not present

## 2023-01-17 DIAGNOSIS — J449 Chronic obstructive pulmonary disease, unspecified: Secondary | ICD-10-CM | POA: Diagnosis not present

## 2023-01-17 LAB — CUP PACEART REMOTE DEVICE CHECK
Battery Impedance: 760 Ohm
Battery Remaining Longevity: 67 mo
Battery Voltage: 2.77 V
Brady Statistic AP VP Percent: 0 %
Brady Statistic AP VS Percent: 99 %
Brady Statistic AS VP Percent: 0 %
Brady Statistic AS VS Percent: 1 %
Date Time Interrogation Session: 20241113123639
Implantable Lead Connection Status: 753985
Implantable Lead Connection Status: 753985
Implantable Lead Implant Date: 20180807
Implantable Lead Implant Date: 20180807
Implantable Lead Location: 753859
Implantable Lead Location: 753860
Implantable Lead Model: 5076
Implantable Lead Model: 5076
Implantable Pulse Generator Implant Date: 20180807
Lead Channel Impedance Value: 322 Ohm
Lead Channel Impedance Value: 465 Ohm
Lead Channel Pacing Threshold Amplitude: 0.75 V
Lead Channel Pacing Threshold Amplitude: 0.75 V
Lead Channel Pacing Threshold Pulse Width: 0.4 ms
Lead Channel Pacing Threshold Pulse Width: 0.4 ms
Lead Channel Setting Pacing Amplitude: 2 V
Lead Channel Setting Pacing Amplitude: 2.5 V
Lead Channel Setting Pacing Pulse Width: 0.4 ms
Lead Channel Setting Sensing Sensitivity: 5.6 mV
Zone Setting Status: 755011
Zone Setting Status: 755011

## 2023-01-24 ENCOUNTER — Ambulatory Visit
Admission: RE | Admit: 2023-01-24 | Discharge: 2023-01-24 | Disposition: A | Discharge status: Home / Self Care | Payer: Medicare PPO | Source: Ambulatory Visit | Attending: Internal Medicine | Admitting: Internal Medicine

## 2023-01-24 DIAGNOSIS — J449 Chronic obstructive pulmonary disease, unspecified: Secondary | ICD-10-CM | POA: Diagnosis not present

## 2023-01-24 DIAGNOSIS — M1712 Unilateral primary osteoarthritis, left knee: Secondary | ICD-10-CM | POA: Diagnosis not present

## 2023-01-24 DIAGNOSIS — R634 Abnormal weight loss: Secondary | ICD-10-CM

## 2023-01-24 DIAGNOSIS — M7062 Trochanteric bursitis, left hip: Secondary | ICD-10-CM | POA: Diagnosis not present

## 2023-01-24 DIAGNOSIS — K5900 Constipation, unspecified: Secondary | ICD-10-CM | POA: Diagnosis not present

## 2023-01-24 DIAGNOSIS — I4891 Unspecified atrial fibrillation: Secondary | ICD-10-CM | POA: Diagnosis not present

## 2023-01-24 DIAGNOSIS — S300XXD Contusion of lower back and pelvis, subsequent encounter: Secondary | ICD-10-CM | POA: Diagnosis not present

## 2023-01-24 DIAGNOSIS — I509 Heart failure, unspecified: Secondary | ICD-10-CM | POA: Diagnosis not present

## 2023-01-24 DIAGNOSIS — E785 Hyperlipidemia, unspecified: Secondary | ICD-10-CM | POA: Diagnosis not present

## 2023-01-24 DIAGNOSIS — K5730 Diverticulosis of large intestine without perforation or abscess without bleeding: Secondary | ICD-10-CM | POA: Diagnosis not present

## 2023-01-24 DIAGNOSIS — I130 Hypertensive heart and chronic kidney disease with heart failure and stage 1 through stage 4 chronic kidney disease, or unspecified chronic kidney disease: Secondary | ICD-10-CM | POA: Diagnosis not present

## 2023-01-24 DIAGNOSIS — N183 Chronic kidney disease, stage 3 unspecified: Secondary | ICD-10-CM | POA: Diagnosis not present

## 2023-02-01 DIAGNOSIS — S300XXD Contusion of lower back and pelvis, subsequent encounter: Secondary | ICD-10-CM | POA: Diagnosis not present

## 2023-02-01 DIAGNOSIS — E785 Hyperlipidemia, unspecified: Secondary | ICD-10-CM | POA: Diagnosis not present

## 2023-02-01 DIAGNOSIS — J449 Chronic obstructive pulmonary disease, unspecified: Secondary | ICD-10-CM | POA: Diagnosis not present

## 2023-02-01 DIAGNOSIS — M7062 Trochanteric bursitis, left hip: Secondary | ICD-10-CM | POA: Diagnosis not present

## 2023-02-01 DIAGNOSIS — I509 Heart failure, unspecified: Secondary | ICD-10-CM | POA: Diagnosis not present

## 2023-02-01 DIAGNOSIS — I130 Hypertensive heart and chronic kidney disease with heart failure and stage 1 through stage 4 chronic kidney disease, or unspecified chronic kidney disease: Secondary | ICD-10-CM | POA: Diagnosis not present

## 2023-02-01 DIAGNOSIS — M1712 Unilateral primary osteoarthritis, left knee: Secondary | ICD-10-CM | POA: Diagnosis not present

## 2023-02-01 DIAGNOSIS — I4891 Unspecified atrial fibrillation: Secondary | ICD-10-CM | POA: Diagnosis not present

## 2023-02-01 DIAGNOSIS — N183 Chronic kidney disease, stage 3 unspecified: Secondary | ICD-10-CM | POA: Diagnosis not present

## 2023-02-05 DIAGNOSIS — S300XXD Contusion of lower back and pelvis, subsequent encounter: Secondary | ICD-10-CM | POA: Diagnosis not present

## 2023-02-05 DIAGNOSIS — M1712 Unilateral primary osteoarthritis, left knee: Secondary | ICD-10-CM | POA: Diagnosis not present

## 2023-02-05 DIAGNOSIS — I509 Heart failure, unspecified: Secondary | ICD-10-CM | POA: Diagnosis not present

## 2023-02-05 DIAGNOSIS — J449 Chronic obstructive pulmonary disease, unspecified: Secondary | ICD-10-CM | POA: Diagnosis not present

## 2023-02-05 DIAGNOSIS — I4891 Unspecified atrial fibrillation: Secondary | ICD-10-CM | POA: Diagnosis not present

## 2023-02-05 DIAGNOSIS — E785 Hyperlipidemia, unspecified: Secondary | ICD-10-CM | POA: Diagnosis not present

## 2023-02-05 DIAGNOSIS — I130 Hypertensive heart and chronic kidney disease with heart failure and stage 1 through stage 4 chronic kidney disease, or unspecified chronic kidney disease: Secondary | ICD-10-CM | POA: Diagnosis not present

## 2023-02-05 DIAGNOSIS — N183 Chronic kidney disease, stage 3 unspecified: Secondary | ICD-10-CM | POA: Diagnosis not present

## 2023-02-05 DIAGNOSIS — M7062 Trochanteric bursitis, left hip: Secondary | ICD-10-CM | POA: Diagnosis not present

## 2023-02-06 DIAGNOSIS — I130 Hypertensive heart and chronic kidney disease with heart failure and stage 1 through stage 4 chronic kidney disease, or unspecified chronic kidney disease: Secondary | ICD-10-CM | POA: Diagnosis not present

## 2023-02-06 DIAGNOSIS — E039 Hypothyroidism, unspecified: Secondary | ICD-10-CM | POA: Diagnosis not present

## 2023-02-06 DIAGNOSIS — N1831 Chronic kidney disease, stage 3a: Secondary | ICD-10-CM | POA: Diagnosis not present

## 2023-02-06 DIAGNOSIS — I5030 Unspecified diastolic (congestive) heart failure: Secondary | ICD-10-CM | POA: Diagnosis not present

## 2023-02-06 DIAGNOSIS — E785 Hyperlipidemia, unspecified: Secondary | ICD-10-CM | POA: Diagnosis not present

## 2023-02-07 NOTE — Progress Notes (Signed)
Remote pacemaker transmission.   

## 2023-02-12 DIAGNOSIS — D692 Other nonthrombocytopenic purpura: Secondary | ICD-10-CM | POA: Diagnosis not present

## 2023-02-12 DIAGNOSIS — N183 Chronic kidney disease, stage 3 unspecified: Secondary | ICD-10-CM | POA: Diagnosis not present

## 2023-02-12 DIAGNOSIS — R251 Tremor, unspecified: Secondary | ICD-10-CM | POA: Diagnosis not present

## 2023-02-12 DIAGNOSIS — I5030 Unspecified diastolic (congestive) heart failure: Secondary | ICD-10-CM | POA: Diagnosis not present

## 2023-02-12 DIAGNOSIS — N1831 Chronic kidney disease, stage 3a: Secondary | ICD-10-CM | POA: Diagnosis not present

## 2023-02-12 DIAGNOSIS — Z0000 Encounter for general adult medical examination without abnormal findings: Secondary | ICD-10-CM | POA: Diagnosis not present

## 2023-02-12 DIAGNOSIS — I4891 Unspecified atrial fibrillation: Secondary | ICD-10-CM | POA: Diagnosis not present

## 2023-02-12 DIAGNOSIS — E785 Hyperlipidemia, unspecified: Secondary | ICD-10-CM | POA: Diagnosis not present

## 2023-02-12 DIAGNOSIS — I130 Hypertensive heart and chronic kidney disease with heart failure and stage 1 through stage 4 chronic kidney disease, or unspecified chronic kidney disease: Secondary | ICD-10-CM | POA: Diagnosis not present

## 2023-02-12 DIAGNOSIS — I700 Atherosclerosis of aorta: Secondary | ICD-10-CM | POA: Diagnosis not present

## 2023-02-12 DIAGNOSIS — I509 Heart failure, unspecified: Secondary | ICD-10-CM | POA: Diagnosis not present

## 2023-02-12 DIAGNOSIS — S300XXD Contusion of lower back and pelvis, subsequent encounter: Secondary | ICD-10-CM | POA: Diagnosis not present

## 2023-02-12 DIAGNOSIS — I480 Paroxysmal atrial fibrillation: Secondary | ICD-10-CM | POA: Diagnosis not present

## 2023-02-12 DIAGNOSIS — J449 Chronic obstructive pulmonary disease, unspecified: Secondary | ICD-10-CM | POA: Diagnosis not present

## 2023-02-12 DIAGNOSIS — M1712 Unilateral primary osteoarthritis, left knee: Secondary | ICD-10-CM | POA: Diagnosis not present

## 2023-02-12 DIAGNOSIS — M7062 Trochanteric bursitis, left hip: Secondary | ICD-10-CM | POA: Diagnosis not present

## 2023-02-12 DIAGNOSIS — R296 Repeated falls: Secondary | ICD-10-CM | POA: Diagnosis not present

## 2023-02-18 DIAGNOSIS — H532 Diplopia: Secondary | ICD-10-CM | POA: Diagnosis not present

## 2023-02-19 DIAGNOSIS — M7062 Trochanteric bursitis, left hip: Secondary | ICD-10-CM | POA: Diagnosis not present

## 2023-02-19 DIAGNOSIS — J449 Chronic obstructive pulmonary disease, unspecified: Secondary | ICD-10-CM | POA: Diagnosis not present

## 2023-02-19 DIAGNOSIS — N183 Chronic kidney disease, stage 3 unspecified: Secondary | ICD-10-CM | POA: Diagnosis not present

## 2023-02-19 DIAGNOSIS — E785 Hyperlipidemia, unspecified: Secondary | ICD-10-CM | POA: Diagnosis not present

## 2023-02-19 DIAGNOSIS — I4891 Unspecified atrial fibrillation: Secondary | ICD-10-CM | POA: Diagnosis not present

## 2023-02-19 DIAGNOSIS — I130 Hypertensive heart and chronic kidney disease with heart failure and stage 1 through stage 4 chronic kidney disease, or unspecified chronic kidney disease: Secondary | ICD-10-CM | POA: Diagnosis not present

## 2023-02-19 DIAGNOSIS — M1712 Unilateral primary osteoarthritis, left knee: Secondary | ICD-10-CM | POA: Diagnosis not present

## 2023-02-19 DIAGNOSIS — I509 Heart failure, unspecified: Secondary | ICD-10-CM | POA: Diagnosis not present

## 2023-02-19 DIAGNOSIS — S300XXD Contusion of lower back and pelvis, subsequent encounter: Secondary | ICD-10-CM | POA: Diagnosis not present

## 2023-02-21 ENCOUNTER — Other Ambulatory Visit: Payer: Self-pay | Admitting: Cardiology

## 2023-03-13 ENCOUNTER — Other Ambulatory Visit: Payer: Self-pay | Admitting: Cardiology

## 2023-03-26 ENCOUNTER — Ambulatory Visit: Payer: Medicare PPO

## 2023-03-30 ENCOUNTER — Telehealth: Payer: Self-pay | Admitting: Cardiology

## 2023-03-30 NOTE — Telephone Encounter (Signed)
Patient's caregiver called in reporting concerns with labile blood pressure readings.  States she checked her blood pressure this morning prior to taking her medications and was noted at 183/95.  They proceeded to go to [facility] to use a blood pressure cuff there and found blood pressure to be 100/64.  Since that time she had come home and taken her home metoprolol of 37.5 mg twice daily as well as amlodipine 2.5 mg daily.  Most recent blood pressure reading 147/87.  Given her advanced age would prefer that she have some permissive hypertension to ensure no episodes of dizziness or lightheadedness.  I advised to continue on her current regimen and spotcheck her blood pressures through the weekend.  Follow-up if further concerns.

## 2023-04-01 ENCOUNTER — Ambulatory Visit: Payer: Medicare PPO | Attending: Cardiology | Admitting: Cardiology

## 2023-04-01 ENCOUNTER — Encounter: Payer: Self-pay | Admitting: Cardiology

## 2023-04-01 VITALS — BP 126/60 | HR 85 | Ht 65.0 in | Wt 108.0 lb

## 2023-04-01 DIAGNOSIS — I480 Paroxysmal atrial fibrillation: Secondary | ICD-10-CM

## 2023-04-01 DIAGNOSIS — I2510 Atherosclerotic heart disease of native coronary artery without angina pectoris: Secondary | ICD-10-CM | POA: Diagnosis not present

## 2023-04-01 DIAGNOSIS — I495 Sick sinus syndrome: Secondary | ICD-10-CM | POA: Diagnosis not present

## 2023-04-01 DIAGNOSIS — I10 Essential (primary) hypertension: Secondary | ICD-10-CM | POA: Diagnosis not present

## 2023-04-01 DIAGNOSIS — Z952 Presence of prosthetic heart valve: Secondary | ICD-10-CM | POA: Diagnosis not present

## 2023-04-01 NOTE — Telephone Encounter (Signed)
Calling in for update. Please advise

## 2023-04-01 NOTE — Telephone Encounter (Signed)
Called and spoke to patient's care giver Thayer Ohm. Verified patient's name and DOB. She is calling due to patient's BP remaining elevated.She took it this morning and it was 167/73. She deny any other symptoms at this time. Scheduled an appt with Dr Swaziland at 3:20 pm.

## 2023-04-01 NOTE — Patient Instructions (Signed)
Medication Instructions:  Your physician recommends that you continue on your current medications as directed. Please refer to the Current Medication list given to you today.  *If you need a refill on your cardiac medications before your next appointment, please call your pharmacy*   Follow-Up: At [facility], you and your health needs are our priority.  As part of our continuing mission to provide you with exceptional heart care, we have created designated Provider Care Teams.  These Care Teams include your primary Cardiologist (physician) and Advanced Practice Providers (APPs -  Physician Assistants and Nurse Practitioners) who all work together to provide you with the care you need, when you need it.  We recommend signing up for the patient portal called "MyChart".  Sign up information is provided on this After Visit Summary.  MyChart is used to connect with patients for Virtual Visits (Telemedicine).  Patients are able to view lab/test results, encounter notes, upcoming appointments, etc.  Non-urgent messages can be sent to your provider as well.   To learn more about what you can do with MyChart, go to [email or web address].    Your next appointment:   6 month(s)  Provider:   Peter Swaziland, MD

## 2023-04-01 NOTE — Progress Notes (Signed)
[name] [name] Date of Birth: 11/11/1929 Medical Record #[id number]  History of Present Illness: [name] [name] is seen today as a work in for evaluation of elevated BP.  She has a history of paroxysmal atrial fibrillation with tachy-brady syndrome and is s/p pacemaker implant. She is on chronic anticoagulation with Eliquis. Echo in July 2015 showed moderate aortic stenosis that had progressed since 2012. Repeat in July 2016 and July 2017 showed no change. In April 2017 she had atrial fibrillation that converted spontaneously. This was felt to be triggered by a steroid injection. Her metoprolol was increased at that time. When seen in June 2017 she was noted to be in persistent Afib. We placed her on Flecainide but this was later stopped due to side effects. She was seen in November with recurrent and persistent Afib. Rate was controlled but she was symptomatic. Seen in Afib clinic and started on amiodarone with subsequent conversion to NSR.   She later developed symptoms of dyspnea.   She had PFTs in December that showed mild obstructive defect with marked decrease in diffusion capacity.  Myoview study was normal. When seen by Dr. Johney Frame in late July it was noted that she had failure of her RA lead. Her device was reprogrammed but she developed pectoral stimulation. She underwent explantation of her old generator and placement of a new left chest dual chamber pacemaker on October 09 2016. Because of her ongoing symptoms we recommended a right and left heart cath and this was done on 11/27/16. This demonstrated severe AS as well as a severe stenosis in the mid LAD. She subsequently underwent successful PCI of the LAD with DES on 12/20/16 with 2.75 x 16 mm Promus stent.   She underwent TAVR on January 29, 2017  with a #23 Edwards Sapien valve. Post op course complicated by pericarditis and AFib with RVR she was treated with anti-inflammatories and Amiodarone. Later Eliquis resumed and ASA stopped. Still on  Plavix for stent. Follow up Echo was Ok. Repeat Echo 01/22/18 still showed good results. Pacemaker check on 12/12/17 showed normal function without atrial high rate episodes. When seen in the valve clinic on 01/22/18 Plavix was discontinued.   She underwent  left breast lumpectomy with Dr Carolynne Edouard for newly diagnosed breast mass on 10/04/20. Initially placed on Letrozole but this was discontinued due to side effects.   She presented to the ED on 07/09/2022 with complaints of vague chest discomfort.  She was noted to be in atrial fibrillation.  Cardiology was consulted.  It was felt that her chest discomfort was likely related to episodes of atrial fibrillation with RVR.  She spontaneously converted to sinus rhythm.  She was advised to take an additional metoprolol as needed for breakthrough atrial fibrillation.  She was discharged home in stable condition. Last pacer check in November 2024 showed Afib burden of 0.6%.  When seen in September she had suffered a fall with bruises. Noted some orthostatic dizziness so we reduced amlodipine dose. This past weekend she noted her BP shot up to 185 systolic. She didn't feel any different but became alarmed. Later BP came down to 160s then 140s. She went to [facility] and it was 100/60. She does have chronic dizziness and diplopia. Seeing eye specialist for this no syncope. No chest pain.    Current Outpatient Medications on File Prior to Visit  Medication Sig Dispense Refill   amiodarone (PACERONE) 200 MG tablet TAKE 1/2 TABLET(100 MG) BY MOUTH DAILY 45 tablet 3   amLODipine (  NORVASC) 5 MG tablet Take 0.5 tablets (2.5 mg total) by mouth daily. TAKE 1 TABLET(5 MG) BY MOUTH DAILY 45 tablet 3   apixaban (ELIQUIS) 2.5 MG TABS tablet TAKE 1 TABLET(2.5 MG) BY MOUTH TWICE DAILY 180 tablet 1   calcium carbonate (OS-CAL) 600 MG TABS Take 600 mg by mouth 2 (two) times daily with a meal.     ezetimibe (ZETIA) 10 MG tablet TAKE 1 TABLET BY MOUTH DAILY 90 tablet 2   feeding supplement,  ENSURE ENLIVE, (ENSURE ENLIVE) LIQD Take 237 mLs by mouth 2 (two) times daily between meals. (Patient taking differently: Take 237 mLs by mouth daily.) 237 mL 12   fluticasone (FLONASE) 50 MCG/ACT nasal spray Place 2 sprays into both nostrils daily as needed for allergies (congestion).     furosemide (LASIX) 20 MG tablet Take 20 mg every other day.May take 20 mg every day if swelling worsens     metoprolol tartrate (LOPRESSOR) 25 MG tablet TAKE 1 AND 1/2 TABLETS(37.5 MG) BY MOUTH TWICE DAILY 270 tablet 2   nitroGLYCERIN (NITROSTAT) 0.4 MG SL tablet For chest pain, tightness, or pressure. While sitting, place 1 tablet under tongue. May be used every 5 minutes as needed, for up to 15 minutes. Do not use more than 3 tablets. 25 tablet 12   pantoprazole (PROTONIX) 40 MG tablet Take 1 tablet (40 mg total) by mouth 2 (two) times daily before a meal. 180 tablet 3   polyvinyl alcohol (LIQUIFILM TEARS) 1.4 % ophthalmic solution Place 1-2 drops into both eyes 3 (three) times daily as needed for dry eyes.     SYNTHROID 125 MCG tablet Take 125 mcg by mouth daily before breakfast.      Multiple Vitamins-Minerals (PRESERVISION AREDS 2) CAPS Take 1 capsule by mouth 2 (two) times daily. (Patient not taking: Reported on 04/01/2023)     No current facility-administered medications on file prior to visit.    Allergies  Allergen Reactions   Penicillins Anaphylaxis and Other (See Comments)    Has patient had a PCN reaction causing immediate rash, facial/tongue/throat swelling, SOB or lightheadedness with hypotension: Yes Has patient had a PCN reaction causing severe rash involving mucus membranes or skin necrosis: No Has patient had a PCN reaction that required hospitalization: No Has patient had a PCN reaction occurring within the last 10 years: No If all of the above answers are "NO", then may proceed with Cephalosporin use.    Tetanus Toxoid Anaphylaxis   Demerol Nausea Only    Severe nausea    Clindamycin/Lincomycin     Sores in mouth   Letrozole Other (See Comments)   Meningococcal Poly Tetanus Conj Vaccine Acwy Other (See Comments)   Moxifloxacin Hcl Other (See Comments)   Codeine Nausea Only    Severe nausea    Past Medical History:  Diagnosis Date   CKD (chronic kidney disease)    Complication of anesthesia    Coronary artery disease    a. 12/20/2016: s/p DES to LAD    Diverticulosis    Family history of adverse reaction to anesthesia    sister also has post-op n/v   Family history of breast cancer 09/01/2020   GERD (gastroesophageal reflux disease)    Hemorrhoids    Hyperlipidemia    Hypertension    Hypothyroidism    Osteoarthritis    Paroxysmal atrial fibrillation (HCC)    a. on OAC with Xarelto but switched to Eliquis after stenting.    Personal history of skin cancer 09/01/2020  PONV (postoperative nausea and vomiting)    Presence of permanent cardiac pacemaker    S/P TAVR (transcatheter aortic valve replacement) 01/29/2017   23 mm Edwards Sapien 3 transcatheter heart valve placed via percutaneous right transfemoral approach     Past Surgical History:  Procedure Laterality Date   ABDOMINAL HYSTERECTOMY  1980   BASAL CELL CARCINOMA EXCISION     BREAST LUMPECTOMY WITH RADIOACTIVE SEED LOCALIZATION Left 10/04/2020   Procedure: LEFT BREAST LUMPECTOMY WITH RADIOACTIVE SEED LOCALIZATION;  Surgeon: Griselda Miner, MD;  Location: [facility] OR;  Service: General;  Laterality: Left;   CARDIAC CATHETERIZATION  ~ 11/2016   CATARACT EXTRACTION W/ INTRAOCULAR LENS  IMPLANT, BILATERAL Bilateral    CORONARY ANGIOPLASTY WITH STENT PLACEMENT  12/20/2016   "1 stent"   CORONARY STENT INTERVENTION N/A 12/20/2016   Procedure: CORONARY STENT INTERVENTION;  Surgeon: Swaziland, Shakthi Scipio M, MD;  Location: [facility] INVASIVE CV LAB;  Service: Cardiovascular;  Laterality: N/A;   FINGER SURGERY Left    "fell; developed skiers thumb; had to operate on it"   INSERT / REPLACE / REMOVE PACEMAKER  1993    for syptomatic bradycardia and syncope -- in [facility]   INTRAMEDULLARY (IM) NAIL INTERTROCHANTERIC Left 10/19/2018   Procedure: INTRAMEDULLARY (IM) NAIL INTERTROCHANTRIC;  Surgeon: Myrene Galas, MD;  Location: [facility] OR;  Service: Orthopedics;  Laterality: Left;   LEAD REVISION/REPAIR N/A 10/09/2016   New left subclavian MDT Adapta L PPM dual chamber system implanted by Dr Johney Frame with previously placed R subclavian system abandoned   PACEMAKER GENERATOR CHANGE  2001   pulse generator replacement by Dr. March Rummage GENERATOR CHANGE  04/01/2008   PPM Medtronic -- model # ADDRL1 serial # [id number] -- pulse generator replacement by Dr. Reyes Ivan    RIGHT/LEFT HEART CATH AND CORONARY ANGIOGRAPHY N/A 11/27/2016   Procedure: RIGHT/LEFT HEART CATH AND CORONARY ANGIOGRAPHY;  Surgeon: Swazil[place], Skylar Flynt M, MD;  Location: [facility] INVASIVE CV LAB;  Service: Cardiovascular;  Laterality: N/A;   SQUAMOUS CELL CARCINOMA EXCISION     TEE WITHOUT CARDIOVERSION N/A 01/29/2017   Procedure: TRANSESOPHAGEAL ECHOCARDIOGRAM (TEE);  Surgeon: Tonny Bollman, MD;  Location: [facility] OR;  Service: Open Heart Surgery;  Laterality: N/A;   TONSILLECTOMY     TRANSCATHETER AORTIC VALVE REPLACEMENT, TRANSFEMORAL N/A 01/29/2017   Procedure: TRANSCATHETER AORTIC VALVE REPLACEMENT, TRANSFEMORAL;  Surgeon: Tonny Bollman, MD;  Location: [facility] OR;  Service: Open Heart Surgery;  Laterality: N/A;    Social History   Tobacco Use  Smoking Status Former   Current packs/day: 0.00   Average packs/day: 1.5 packs/day for 30.0 years (45.0 ttl pk-yrs)   Types: Cigarettes   Start date: 03/05/1948   Quit date: 03/05/1978   Years since quitting: 45.1  Smokeless Tobacco Never    Social History   Substance and Sexual Activity  Alcohol Use No    Family History  Problem Relation Age of Onset   Alzheimer's disease Mother    Kidney cancer Father        dx 63s   Breast cancer Maternal Aunt 68   Breast cancer Cousin        maternal cousin, dx  >50    Review of Systems: As noted in history of present illness.  All other systems were reviewed and are negative.  Physical Exam: BP 126/60   Pulse 85   Ht 5\' 5"  (1.651 m)   Wt 108 lb (49 kg)   SpO2 99%   BMI 17.97 kg/m  GENERAL:  Well appearing,  elderly WF in NAD. Walks with cane HEENT:  PERRL, EOMI, sclera are clear. Oropharynx is clear. NECK:  No jugular venous distention, carotid upstroke brisk and symmetric, no bruits, no thyromegaly or adenopathy LUNGS:  Clear to auscultation bilaterally CHEST:  Unremarkable HEART:  RRR,  PMI not displaced or sustained,S1 and S2 within normal limits, no S3, no S4: no clicks, no rubs, very soft systolic murmur at the apex. ABD:  Soft, nontender. BS +, no masses or bruits. No hepatomegaly, no splenomegaly EXT:  2 + pulses throughout, 2+ RLE edema 1+ on left. no cyanosis no clubbing SKIN:  Warm and dry.  No rashes NEURO:  Alert and oriented x 3. Cranial nerves II through XII intact. PSYCH:  Cognitively intact   LABORATORY DATA:  Lab Results  Component Value Date   WBC 6.3 07/09/2022   HGB 13.4 07/09/2022   HCT 39.4 07/09/2022   PLT 152 07/09/2022   GLUCOSE 106 (H) 07/09/2022   CHOL 219 (H) 02/05/2018   TRIG 70 02/05/2018   HDL 103 02/05/2018   LDLCALC 102 (H) 02/05/2018   ALT 15 09/19/2021   AST 27 09/19/2021   NA 138 07/09/2022   K 3.9 07/09/2022   CL 100 07/09/2022   CREATININE 1.27 (H) 07/09/2022   BUN 33 (H) 07/09/2022   CO2 28 07/09/2022   TSH 3.890 09/19/2021   INR 1.23 01/29/2017   HGBA1C 5.5 01/28/2017   Labs dated 07/15/15: cholesterol 229, triglycerides 111, HDL 76, LDL 131.  Dated 07/19/16: cholesterol 247, triglycerides 71, HDL 88, LDL 145. BUN 28, creatinine 1.3. Other chemistries, CBC and TSH normal. Dated 03/15/17: creatinine 1.7, Hgb 11.3. Dated 10/22/17: cholesterol 251, triglycerides 66, HDL 93, LDL 145. Hgb 11.7, creatinine 1.3. TSH and ALT normal. Dated 01/12/19: cholesterol 224, triglycerides 105, HDL 96,  LDL 107.  Dated 06/15/19: BUN 36, creatinine 1.5. CMET otherwise normal. CBC and TSH normal. Dated 12/26/20: cholesterol 199, triglycerides 51, HDL 90, LDL 99.  Dated 01/31/22: cholesterol 206, triglycerides 53, HDL 96, LDL 99. LFTs normal. TSH and Free T 4 were normal.    Echo 07/26/20: 1. Left ventricular ejection fraction, by estimation, is 60 to 65%. The left ventricle has normal function. The left ventricle has no regional wall motion abnormalities. Left ventricular diastolic parameters are indeterminate. 2. Right ventricular systolic function is normal. The right ventricular size is normal. 3. Mild mitral valve regurgitation. 4. Tricuspid valve regurgitation is moderate. 5. S/p TAVR (01/29/17) 23 mm Edwards bioprosthesis. Peak and mean gradients through the valve are 17 and 8 mm Hg repcectively AVA (VTI 1.53 cm2) No significant change from gradients in echo of 01/22/18 . Aortic valve regurgitation is mild. Left Ventricle: Left ventricular ejection fraction, by estimation, is 60 to 65%. The left ventricle has normal function. The left ventricle has no regional wall motion abnormalities. The left ventricular internal cavity size was normal in size. There is no left ventricular hypertrophy. Left ventricular diastolic parameters are indeterminate. Right Ventricle: The right ventricular size is normal. Right vetricular wall thickness was not assessed. Right ventricular systolic function is normal. Left Atrium: Left atrial size was normal in size. Right Atrium: Right atrial size was normal in size. Pericardium: There is no evidence of pericardial effusion. Mitral Valve: There is mild thickening of the mitral valve leaflet(s). Mild mitral annular calcification. Mild  Assessment / Plan: 1. Atrial fibrillation with sick sinus syndrome. Prior history of  persistent AFib.  Now on amiodarone 100 mg daily. By pacer check has Afib <  1% of the time.  Continue long-term anticoagulation. On  Eliquis  2.5 mg bid.  Dose adjusted for age and renal function. Continue metoprolol for rate control.   2. Aortic stenosis-   symptomatic. Now s/p TAVR. Stable exam. Echo in June showed mild to mod perivalvular leak. No change in therapy.   3. CAD with significant single vessel obstructive disease in the mid LAD. S/p PCI of the LAD with DES October 2018.  On Eliquis. No ASA due to bleeding risk.  4. HTN- well controlled.  I told her not to be alarmed if BP is high on one reading unless she has new symptoms with this. We did check her BP monitor today and it correlated well. Instructed her to check BP at most once a day. No change in medication.   5. Hyperlipidemia. Intolerant of statins. Now on Zetia 10 mg daily. LDL 99.   6. Chronic diastolic CHF. She does have increased edema. Continue lasix at current dose.   7. S/p femoral neck fracture with ORIF. Recovered.   8. Breast CA. S/p lumpectomy. Monitored by oncology. Did not tolerate antiestrogen therapy.   Follow up in 6 months.

## 2023-04-17 ENCOUNTER — Ambulatory Visit (INDEPENDENT_AMBULATORY_CARE_PROVIDER_SITE_OTHER): Payer: Medicare PPO

## 2023-04-17 DIAGNOSIS — I495 Sick sinus syndrome: Secondary | ICD-10-CM

## 2023-04-19 LAB — CUP PACEART REMOTE DEVICE CHECK
Battery Impedance: 918 Ohm
Battery Remaining Longevity: 62 mo
Battery Voltage: 2.77 V
Brady Statistic AP VP Percent: 0 %
Brady Statistic AP VS Percent: 99 %
Brady Statistic AS VP Percent: 0 %
Brady Statistic AS VS Percent: 1 %
Date Time Interrogation Session: 20250212092902
Implantable Lead Connection Status: 753985
Implantable Lead Connection Status: 753985
Implantable Lead Implant Date: 20180807
Implantable Lead Implant Date: 20180807
Implantable Lead Location: 753859
Implantable Lead Location: 753860
Implantable Lead Model: 5076
Implantable Lead Model: 5076
Implantable Pulse Generator Implant Date: 20180807
Lead Channel Impedance Value: 372 Ohm
Lead Channel Impedance Value: 457 Ohm
Lead Channel Pacing Threshold Amplitude: 0.75 V
Lead Channel Pacing Threshold Amplitude: 0.75 V
Lead Channel Pacing Threshold Pulse Width: 0.4 ms
Lead Channel Pacing Threshold Pulse Width: 0.4 ms
Lead Channel Setting Pacing Amplitude: 2 V
Lead Channel Setting Pacing Amplitude: 2.5 V
Lead Channel Setting Pacing Pulse Width: 0.4 ms
Lead Channel Setting Sensing Sensitivity: 5.6 mV
Zone Setting Status: 755011
Zone Setting Status: 755011

## 2023-05-02 DIAGNOSIS — H6123 Impacted cerumen, bilateral: Secondary | ICD-10-CM | POA: Diagnosis not present

## 2023-05-03 ENCOUNTER — Ambulatory Visit: Payer: Medicare PPO | Admitting: Podiatry

## 2023-05-03 ENCOUNTER — Encounter: Payer: Self-pay | Admitting: Podiatry

## 2023-05-03 DIAGNOSIS — M79674 Pain in right toe(s): Secondary | ICD-10-CM | POA: Diagnosis not present

## 2023-05-03 DIAGNOSIS — M79675 Pain in left toe(s): Secondary | ICD-10-CM | POA: Diagnosis not present

## 2023-05-03 DIAGNOSIS — L600 Ingrowing nail: Secondary | ICD-10-CM | POA: Diagnosis not present

## 2023-05-03 DIAGNOSIS — B351 Tinea unguium: Secondary | ICD-10-CM

## 2023-05-03 MED ORDER — MUPIROCIN 2 % EX OINT: 1.0000 | TOPICAL_OINTMENT | Freq: Two times a day (BID) | CUTANEOUS | 2 refills | Status: AC

## 2023-05-03 NOTE — Progress Notes (Signed)
 Subjective:   Patient ID: [name] [name], female   DOB: 89 y.o.   MRN: [id number]   HPI Chief Complaint  Patient presents with   Ingrown Toenail    LT GREAT TOE INGROWN TOE NAIL   89 year old female presents the office today for concerns of ingrown toenail left big toe medial aspect.  She states she is not able to trim the corner out herself the area is tender.  In general her nails are thickened discolored after the nails be trimmed.  No swelling redness or any drainage.  She has no other concerns today.  No open lesions.   Review of Systems  All other systems reviewed and are negative.  Past Medical History:  Diagnosis Date   CKD (chronic kidney disease)    Complication of anesthesia    Coronary artery disease    a. 12/20/2016: s/p DES to LAD    Diverticulosis    Family history of adverse reaction to anesthesia    sister also has post-op n/v   Family history of breast cancer 09/01/2020   GERD (gastroesophageal reflux disease)    Hemorrhoids    Hyperlipidemia    Hypertension    Hypothyroidism    Osteoarthritis    Paroxysmal atrial fibrillation (HCC)    a. on OAC with Xarelto but switched to Eliquis after stenting.    Personal history of skin cancer 09/01/2020   PONV (postoperative nausea and vomiting)    Presence of permanent cardiac pacemaker    S/P TAVR (transcatheter aortic valve replacement) 01/29/2017   23 mm Edwards Sapien 3 transcatheter heart valve placed via percutaneous right transfemoral approach     Past Surgical History:  Procedure Laterality Date   ABDOMINAL HYSTERECTOMY  1980   BASAL CELL CARCINOMA EXCISION     BREAST LUMPECTOMY WITH RADIOACTIVE SEED LOCALIZATION Left 10/04/2020   Procedure: LEFT BREAST LUMPECTOMY WITH RADIOACTIVE SEED LOCALIZATION;  Surgeon: Griselda Miner, MD;  Location: [facility] OR;  Service: General;  Laterality: Left;   CARDIAC CATHETERIZATION  ~ 11/2016   CATARACT EXTRACTION W/ INTRAOCULAR LENS  IMPLANT, BILATERAL Bilateral    CORONARY  ANGIOPLASTY WITH STENT PLACEMENT  12/20/2016   "1 stent"   CORONARY STENT INTERVENTION N/A 12/20/2016   Procedure: CORONARY STENT INTERVENTION;  Surgeon: Swaziland, Peter M, MD;  Location: [facility] INVASIVE CV LAB;  Service: Cardiovascular;  Laterality: N/A;   FINGER SURGERY Left    "fell; developed skiers thumb; had to operate on it"   INSERT / REPLACE / REMOVE PACEMAKER  1993   for syptomatic bradycardia and syncope -- in [facility]   INTRAMEDULLARY (IM) NAIL INTERTROCHANTERIC Left 10/19/2018   Procedure: INTRAMEDULLARY (IM) NAIL INTERTROCHANTRIC;  Surgeon: Myrene Galas, MD;  Location: [facility] OR;  Service: Orthopedics;  Laterality: Left;   LEAD REVISION/REPAIR N/A 10/09/2016   New left subclavian MDT Adapta L PPM dual chamber system implanted by Dr Johney Frame with previously placed R subclavian system abandoned   PACEMAKER GENERATOR CHANGE  2001   pulse generator replacement by Dr. March Rummage GENERATOR CHANGE  04/01/2008   PPM Medtronic -- model # ADDRL1 serial # [id number] -- pulse generator replacement by Dr. Reyes Ivan    RIGHT/LEFT HEART CATH AND CORONARY ANGIOGRAPHY N/A 11/27/2016   Procedure: RIGHT/LEFT HEART CATH AND CORONARY ANGIOGRAPHY;  Surgeon: Swaziland, Peter M, MD;  Location: [facility] INVASIVE CV LAB;  Service: Cardiovascular;  Laterality: N/A;   SQUAMOUS CELL CARCINOMA EXCISION     TEE WITHOUT CARDIOVERSION N/A 01/29/2017   Procedure: TRANSESOPHAGEAL  ECHOCARDIOGRAM (TEE);  Surgeon: Tonny Bollman, MD;  Location: [facility] OR;  Service: Open Heart Surgery;  Laterality: N/A;   TONSILLECTOMY     TRANSCATHETER AORTIC VALVE REPLACEMENT, TRANSFEMORAL N/A 01/29/2017   Procedure: TRANSCATHETER AORTIC VALVE REPLACEMENT, TRANSFEMORAL;  Surgeon: Tonny Bollman, MD;  Location: [facility] OR;  Service: Open Heart Surgery;  Laterality: N/A;     Current Outpatient Medications:    mupirocin ointment (BACTROBAN) 2 %, Apply 1 Application topically 2 (two) times daily., Disp: 30 g, Rfl: 2   amiodarone (PACERONE) 200 MG  tablet, TAKE 1/2 TABLET(100 MG) BY MOUTH DAILY, Disp: 45 tablet, Rfl: 3   amLODipine (NORVASC) 5 MG tablet, Take 0.5 tablets (2.5 mg total) by mouth daily. TAKE 1 TABLET(5 MG) BY MOUTH DAILY, Disp: 45 tablet, Rfl: 3   apixaban (ELIQUIS) 2.5 MG TABS tablet, TAKE 1 TABLET(2.5 MG) BY MOUTH TWICE DAILY, Disp: 180 tablet, Rfl: 1   calcium carbonate (OS-CAL) 600 MG TABS, Take 600 mg by mouth 2 (two) times daily with a meal., Disp: , Rfl:    ezetimibe (ZETIA) 10 MG tablet, TAKE 1 TABLET BY MOUTH DAILY, Disp: 90 tablet, Rfl: 2   feeding supplement, ENSURE ENLIVE, (ENSURE ENLIVE) LIQD, Take 237 mLs by mouth 2 (two) times daily between meals. (Patient taking differently: Take 237 mLs by mouth daily.), Disp: 237 mL, Rfl: 12   fluticasone (FLONASE) 50 MCG/ACT nasal spray, Place 2 sprays into both nostrils daily as needed for allergies (congestion)., Disp: , Rfl:    furosemide (LASIX) 20 MG tablet, Take 20 mg every other day.May take 20 mg every day if swelling worsens, Disp: , Rfl:    metoprolol tartrate (LOPRESSOR) 25 MG tablet, TAKE 1 AND 1/2 TABLETS(37.5 MG) BY MOUTH TWICE DAILY, Disp: 270 tablet, Rfl: 2   Multiple Vitamins-Minerals (PRESERVISION AREDS 2) CAPS, Take 1 capsule by mouth 2 (two) times daily. (Patient not taking: Reported on 04/01/2023), Disp: , Rfl:    nitroGLYCERIN (NITROSTAT) 0.4 MG SL tablet, For chest pain, tightness, or pressure. While sitting, place 1 tablet under tongue. May be used every 5 minutes as needed, for up to 15 minutes. Do not use more than 3 tablets., Disp: 25 tablet, Rfl: 12   pantoprazole (PROTONIX) 40 MG tablet, Take 1 tablet (40 mg total) by mouth 2 (two) times daily before a meal., Disp: 180 tablet, Rfl: 3   polyvinyl alcohol (LIQUIFILM TEARS) 1.4 % ophthalmic solution, Place 1-2 drops into both eyes 3 (three) times daily as needed for dry eyes., Disp: , Rfl:    SYNTHROID 125 MCG tablet, Take 125 mcg by mouth daily before breakfast. , Disp: , Rfl:   Allergies  Allergen  Reactions   Penicillins Anaphylaxis and Other (See Comments)    Has patient had a PCN reaction causing immediate rash, facial/tongue/throat swelling, SOB or lightheadedness with hypotension: Yes Has patient had a PCN reaction causing severe rash involving mucus membranes or skin necrosis: No Has patient had a PCN reaction that required hospitalization: No Has patient had a PCN reaction occurring within the last 10 years: No If all of the above answers are "NO", then may proceed with Cephalosporin use.    Tetanus Toxoid Anaphylaxis   Demerol Nausea Only    Severe nausea   Clindamycin/Lincomycin     Sores in mouth   Letrozole Other (See Comments)   Meningococcal Poly Tetanus Conj Vaccine Acwy Other (See Comments)   Moxifloxacin Hcl Other (See Comments)   Codeine Nausea Only    Severe nausea  Objective:  Physical Exam  General: AAO x3, NAD  Dermatological: Nails are hypertrophic, dystrophic, brittle, discolored, elongated 10.  Incurvation present medial aspect left hallux toenail distally.  There is edema but there is no cellulitis.  No drainage or pus. Tenderness nails 1-5 bilaterally. No open lesions or pre-ulcerative lesions are identified today.   Vascular: Dorsalis Pedis artery and Posterior Tibial artery pedal pulses are palpable bilateral with immedate capillary fill time. There is no pain with calf compression, swelling, warmth, erythema.   Neruologic: Grossly intact via light touch bilateral.  Musculoskeletal: No other areas of discomfort.      Assessment:   Symptomatic ingrown toenail, onychomycosis-on Eliquis     Plan:  -Treatment options discussed including all alternatives, risks, and complications -Etiology of symptoms were discussed -Regards to the ingrown toenail we discussed partial nail avulsion versus debride.  She was proceed with debridement.  I sharply debrided the corn without any complications.  Minimal bleeding.  Cleaned area and  antibiotic ointment is applied followed by dressing.  Monitoring signs or symptoms of infection.  Should symptoms persist or signs of infection we will need to proceed with partial nail avulsion. -Sharply abraded nails x 10 and complications of bleeding. -Daily foot inspection  Return in about 3 months (around 07/31/2023).  Vivi Barrack DPM

## 2023-05-06 DIAGNOSIS — I480 Paroxysmal atrial fibrillation: Secondary | ICD-10-CM | POA: Diagnosis not present

## 2023-05-06 DIAGNOSIS — J323 Chronic sphenoidal sinusitis: Secondary | ICD-10-CM | POA: Diagnosis not present

## 2023-05-06 DIAGNOSIS — R634 Abnormal weight loss: Secondary | ICD-10-CM | POA: Diagnosis not present

## 2023-05-06 DIAGNOSIS — I5030 Unspecified diastolic (congestive) heart failure: Secondary | ICD-10-CM | POA: Diagnosis not present

## 2023-05-06 DIAGNOSIS — J449 Chronic obstructive pulmonary disease, unspecified: Secondary | ICD-10-CM | POA: Diagnosis not present

## 2023-05-06 DIAGNOSIS — I130 Hypertensive heart and chronic kidney disease with heart failure and stage 1 through stage 4 chronic kidney disease, or unspecified chronic kidney disease: Secondary | ICD-10-CM | POA: Diagnosis not present

## 2023-05-06 DIAGNOSIS — R251 Tremor, unspecified: Secondary | ICD-10-CM | POA: Diagnosis not present

## 2023-05-06 DIAGNOSIS — N1831 Chronic kidney disease, stage 3a: Secondary | ICD-10-CM | POA: Diagnosis not present

## 2023-05-06 DIAGNOSIS — R42 Dizziness and giddiness: Secondary | ICD-10-CM | POA: Diagnosis not present

## 2023-05-07 DIAGNOSIS — H1132 Conjunctival hemorrhage, left eye: Secondary | ICD-10-CM | POA: Diagnosis not present

## 2023-05-07 DIAGNOSIS — H5051 Esophoria: Secondary | ICD-10-CM | POA: Diagnosis not present

## 2023-05-07 DIAGNOSIS — H532 Diplopia: Secondary | ICD-10-CM | POA: Diagnosis not present

## 2023-05-13 ENCOUNTER — Ambulatory Visit: Payer: Medicare PPO | Admitting: Cardiology

## 2023-05-20 DIAGNOSIS — R0981 Nasal congestion: Secondary | ICD-10-CM | POA: Diagnosis not present

## 2023-05-20 DIAGNOSIS — R058 Other specified cough: Secondary | ICD-10-CM | POA: Diagnosis not present

## 2023-05-20 DIAGNOSIS — J029 Acute pharyngitis, unspecified: Secondary | ICD-10-CM | POA: Diagnosis not present

## 2023-05-20 DIAGNOSIS — J449 Chronic obstructive pulmonary disease, unspecified: Secondary | ICD-10-CM | POA: Diagnosis not present

## 2023-05-20 DIAGNOSIS — I480 Paroxysmal atrial fibrillation: Secondary | ICD-10-CM | POA: Diagnosis not present

## 2023-05-20 DIAGNOSIS — J209 Acute bronchitis, unspecified: Secondary | ICD-10-CM | POA: Diagnosis not present

## 2023-05-20 DIAGNOSIS — Z1152 Encounter for screening for COVID-19: Secondary | ICD-10-CM | POA: Diagnosis not present

## 2023-05-20 DIAGNOSIS — J309 Allergic rhinitis, unspecified: Secondary | ICD-10-CM | POA: Diagnosis not present

## 2023-05-22 DIAGNOSIS — R42 Dizziness and giddiness: Secondary | ICD-10-CM | POA: Diagnosis not present

## 2023-05-22 DIAGNOSIS — W19XXXA Unspecified fall, initial encounter: Secondary | ICD-10-CM | POA: Diagnosis not present

## 2023-05-22 DIAGNOSIS — N179 Acute kidney failure, unspecified: Secondary | ICD-10-CM | POA: Diagnosis not present

## 2023-05-22 DIAGNOSIS — I10 Essential (primary) hypertension: Secondary | ICD-10-CM | POA: Diagnosis not present

## 2023-05-22 DIAGNOSIS — S0990XA Unspecified injury of head, initial encounter: Secondary | ICD-10-CM | POA: Diagnosis not present

## 2023-05-22 NOTE — Addendum Note (Signed)
 Addended by: Elease Etienne A on: 05/22/2023 04:51 PM   Modules accepted: Orders

## 2023-05-22 NOTE — Progress Notes (Signed)
 Remote pacemaker transmission.

## 2023-05-23 ENCOUNTER — Observation Stay (HOSPITAL_COMMUNITY)

## 2023-05-23 ENCOUNTER — Observation Stay (HOSPITAL_COMMUNITY)
Admission: EM | Admit: 2023-05-23 | Discharge: 2023-05-28 | Disposition: A | Discharge status: Skilled Nursing Facility | Attending: Emergency Medicine | Admitting: Emergency Medicine

## 2023-05-23 ENCOUNTER — Other Ambulatory Visit: Payer: Self-pay

## 2023-05-23 ENCOUNTER — Encounter (HOSPITAL_COMMUNITY): Payer: Self-pay

## 2023-05-23 ENCOUNTER — Emergency Department (HOSPITAL_COMMUNITY)

## 2023-05-23 DIAGNOSIS — Z952 Presence of prosthetic heart valve: Secondary | ICD-10-CM | POA: Diagnosis not present

## 2023-05-23 DIAGNOSIS — Z87891 Personal history of nicotine dependence: Secondary | ICD-10-CM | POA: Diagnosis not present

## 2023-05-23 DIAGNOSIS — B974 Respiratory syncytial virus as the cause of diseases classified elsewhere: Secondary | ICD-10-CM | POA: Insufficient documentation

## 2023-05-23 DIAGNOSIS — A310 Pulmonary mycobacterial infection: Secondary | ICD-10-CM

## 2023-05-23 DIAGNOSIS — N1831 Chronic kidney disease, stage 3a: Secondary | ICD-10-CM | POA: Diagnosis not present

## 2023-05-23 DIAGNOSIS — J189 Pneumonia, unspecified organism: Secondary | ICD-10-CM | POA: Diagnosis not present

## 2023-05-23 DIAGNOSIS — E875 Hyperkalemia: Secondary | ICD-10-CM | POA: Insufficient documentation

## 2023-05-23 DIAGNOSIS — N179 Acute kidney failure, unspecified: Principal | ICD-10-CM | POA: Insufficient documentation

## 2023-05-23 DIAGNOSIS — G319 Degenerative disease of nervous system, unspecified: Secondary | ICD-10-CM | POA: Diagnosis not present

## 2023-05-23 DIAGNOSIS — J479 Bronchiectasis, uncomplicated: Secondary | ICD-10-CM | POA: Diagnosis not present

## 2023-05-23 DIAGNOSIS — J470 Bronchiectasis with acute lower respiratory infection: Secondary | ICD-10-CM | POA: Diagnosis not present

## 2023-05-23 DIAGNOSIS — J449 Chronic obstructive pulmonary disease, unspecified: Secondary | ICD-10-CM | POA: Diagnosis not present

## 2023-05-23 DIAGNOSIS — E871 Hypo-osmolality and hyponatremia: Secondary | ICD-10-CM | POA: Diagnosis not present

## 2023-05-23 DIAGNOSIS — I10 Essential (primary) hypertension: Secondary | ICD-10-CM | POA: Diagnosis present

## 2023-05-23 DIAGNOSIS — Z79899 Other long term (current) drug therapy: Secondary | ICD-10-CM | POA: Insufficient documentation

## 2023-05-23 DIAGNOSIS — S199XXA Unspecified injury of neck, initial encounter: Secondary | ICD-10-CM | POA: Diagnosis not present

## 2023-05-23 DIAGNOSIS — Z7901 Long term (current) use of anticoagulants: Secondary | ICD-10-CM | POA: Insufficient documentation

## 2023-05-23 DIAGNOSIS — E869 Volume depletion, unspecified: Secondary | ICD-10-CM | POA: Diagnosis not present

## 2023-05-23 DIAGNOSIS — R0602 Shortness of breath: Secondary | ICD-10-CM | POA: Diagnosis not present

## 2023-05-23 DIAGNOSIS — Z950 Presence of cardiac pacemaker: Secondary | ICD-10-CM | POA: Insufficient documentation

## 2023-05-23 DIAGNOSIS — I129 Hypertensive chronic kidney disease with stage 1 through stage 4 chronic kidney disease, or unspecified chronic kidney disease: Secondary | ICD-10-CM | POA: Insufficient documentation

## 2023-05-23 DIAGNOSIS — R059 Cough, unspecified: Secondary | ICD-10-CM | POA: Diagnosis not present

## 2023-05-23 DIAGNOSIS — I480 Paroxysmal atrial fibrillation: Secondary | ICD-10-CM | POA: Diagnosis not present

## 2023-05-23 DIAGNOSIS — Z955 Presence of coronary angioplasty implant and graft: Secondary | ICD-10-CM | POA: Diagnosis not present

## 2023-05-23 DIAGNOSIS — W19XXXA Unspecified fall, initial encounter: Secondary | ICD-10-CM

## 2023-05-23 DIAGNOSIS — E039 Hypothyroidism, unspecified: Secondary | ICD-10-CM | POA: Diagnosis present

## 2023-05-23 DIAGNOSIS — R0989 Other specified symptoms and signs involving the circulatory and respiratory systems: Secondary | ICD-10-CM | POA: Diagnosis not present

## 2023-05-23 DIAGNOSIS — J323 Chronic sphenoidal sinusitis: Secondary | ICD-10-CM | POA: Diagnosis not present

## 2023-05-23 DIAGNOSIS — Y92009 Unspecified place in unspecified non-institutional (private) residence as the place of occurrence of the external cause: Secondary | ICD-10-CM | POA: Diagnosis not present

## 2023-05-23 DIAGNOSIS — I2510 Atherosclerotic heart disease of native coronary artery without angina pectoris: Secondary | ICD-10-CM | POA: Insufficient documentation

## 2023-05-23 DIAGNOSIS — R42 Dizziness and giddiness: Principal | ICD-10-CM | POA: Insufficient documentation

## 2023-05-23 DIAGNOSIS — Z85828 Personal history of other malignant neoplasm of skin: Secondary | ICD-10-CM | POA: Diagnosis not present

## 2023-05-23 DIAGNOSIS — I700 Atherosclerosis of aorta: Secondary | ICD-10-CM | POA: Diagnosis not present

## 2023-05-23 DIAGNOSIS — S0990XA Unspecified injury of head, initial encounter: Secondary | ICD-10-CM | POA: Diagnosis not present

## 2023-05-23 DIAGNOSIS — R918 Other nonspecific abnormal finding of lung field: Secondary | ICD-10-CM

## 2023-05-23 HISTORY — DX: Anemia, unspecified: D64.9

## 2023-05-23 LAB — CBC
HCT: 41.5 % (ref 36.0–46.0)
Hemoglobin: 14.1 g/dL (ref 12.0–15.0)
MCH: 34.6 pg — ABNORMAL HIGH (ref 26.0–34.0)
MCHC: 34 g/dL (ref 30.0–36.0)
MCV: 102 fL — ABNORMAL HIGH (ref 80.0–100.0)
Platelets: 154 10*3/uL (ref 150–400)
RBC: 4.07 MIL/uL (ref 3.87–5.11)
RDW: 14.1 % (ref 11.5–15.5)
WBC: 5.3 10*3/uL (ref 4.0–10.5)
nRBC: 0 % (ref 0.0–0.2)

## 2023-05-23 LAB — I-STAT CHEM 8, ED
BUN: 33 mg/dL — ABNORMAL HIGH (ref 8–23)
Calcium, Ion: 1.15 mmol/L (ref 1.15–1.40)
Chloride: 98 mmol/L (ref 98–111)
Creatinine, Ser: 2 mg/dL — ABNORMAL HIGH (ref 0.44–1.00)
Glucose, Bld: 91 mg/dL (ref 70–99)
HCT: 42 % (ref 36.0–46.0)
Hemoglobin: 14.3 g/dL (ref 12.0–15.0)
Potassium: 5.2 mmol/L — ABNORMAL HIGH (ref 3.5–5.1)
Sodium: 127 mmol/L — ABNORMAL LOW (ref 135–145)
TCO2: 24 mmol/L (ref 22–32)

## 2023-05-23 LAB — URINALYSIS, ROUTINE W REFLEX MICROSCOPIC
Bilirubin Urine: NEGATIVE
Glucose, UA: NEGATIVE mg/dL
Hgb urine dipstick: NEGATIVE
Ketones, ur: NEGATIVE mg/dL
Leukocytes,Ua: NEGATIVE
Nitrite: NEGATIVE
Protein, ur: NEGATIVE mg/dL
Specific Gravity, Urine: 1.004 — ABNORMAL LOW (ref 1.005–1.030)
pH: 7 (ref 5.0–8.0)

## 2023-05-23 LAB — ETHANOL: Alcohol, Ethyl (B): <10 mg/dL (ref <10)

## 2023-05-23 LAB — COMPREHENSIVE METABOLIC PANEL
ALT: 20 U/L (ref 0–44)
AST: 31 U/L (ref 15–41)
Albumin: 3.8 g/dL (ref 3.5–5.0)
Alkaline Phosphatase: 65 U/L (ref 38–126)
Anion gap: 12 (ref 5–15)
BUN: 34 mg/dL — ABNORMAL HIGH (ref 8–23)
CO2: 22 mmol/L (ref 22–32)
Calcium: 9.8 mg/dL (ref 8.9–10.3)
Chloride: 95 mmol/L — ABNORMAL LOW (ref 98–111)
Creatinine, Ser: 1.76 mg/dL — ABNORMAL HIGH (ref 0.44–1.00)
GFR, Estimated: 27 mL/min — ABNORMAL LOW (ref >60)
Glucose, Bld: 97 mg/dL (ref 70–99)
Potassium: 5.2 mmol/L — ABNORMAL HIGH (ref 3.5–5.1)
Sodium: 129 mmol/L — ABNORMAL LOW (ref 135–145)
Total Bilirubin: 0.9 mg/dL (ref 0.0–1.2)
Total Protein: 6.3 g/dL — ABNORMAL LOW (ref 6.5–8.1)

## 2023-05-23 LAB — PROTIME-INR
INR: 1.2 (ref 0.8–1.2)
Prothrombin Time: 15.4 s — ABNORMAL HIGH (ref 11.4–15.2)

## 2023-05-23 LAB — TROPONIN I (HIGH SENSITIVITY): Troponin I (High Sensitivity): 11 ng/L (ref <18)

## 2023-05-23 LAB — BRAIN NATRIURETIC PEPTIDE: B Natriuretic Peptide: 445.1 pg/mL — ABNORMAL HIGH (ref 0.0–100.0)

## 2023-05-23 MED ORDER — FLEET ENEMA RE ENEM: 1.0000 | ENEMA | Freq: Once | RECTAL | Status: DC | PRN

## 2023-05-23 MED ORDER — LEVOTHYROXINE SODIUM 25 MCG PO TABS
125.0000 ug | ORAL_TABLET | Freq: Every day | ORAL | Status: DC
  Administered 2023-05-24 – 2023-05-28 (×5): 125 ug via ORAL
  Filled 2023-05-23 (×6): qty 1

## 2023-05-23 MED ORDER — ALBUTEROL SULFATE (2.5 MG/3ML) 0.083% IN NEBU
3.0000 mL | INHALATION_SOLUTION | Freq: Four times a day (QID) | RESPIRATORY_TRACT | Status: DC | PRN
  Administered 2023-05-25: 3 mL via RESPIRATORY_TRACT
  Filled 2023-05-23: qty 3

## 2023-05-23 MED ORDER — BISACODYL 5 MG PO TBEC: 5.0000 mg | DELAYED_RELEASE_TABLET | Freq: Every day | ORAL | Status: DC | PRN

## 2023-05-23 MED ORDER — SODIUM CHLORIDE 0.9% FLUSH
3.0000 mL | Freq: Two times a day (BID) | INTRAVENOUS | Status: DC
  Administered 2023-05-24 – 2023-05-28 (×7): 3 mL via INTRAVENOUS

## 2023-05-23 MED ORDER — POLYETHYLENE GLYCOL 3350 17 G PO PACK: 17.0000 g | PACK | Freq: Every day | ORAL | Status: DC | PRN

## 2023-05-23 MED ORDER — APIXABAN 2.5 MG PO TABS
2.5000 mg | ORAL_TABLET | Freq: Two times a day (BID) | ORAL | Status: DC
  Administered 2023-05-23 – 2023-05-28 (×10): 2.5 mg via ORAL
  Filled 2023-05-23 (×10): qty 1

## 2023-05-23 MED ORDER — ALBUTEROL SULFATE (2.5 MG/3ML) 0.083% IN NEBU
2.5000 mg | INHALATION_SOLUTION | Freq: Three times a day (TID) | RESPIRATORY_TRACT | Status: DC
  Administered 2023-05-23 – 2023-05-24 (×3): 2.5 mg via RESPIRATORY_TRACT
  Filled 2023-05-23 (×4): qty 3

## 2023-05-23 MED ORDER — SODIUM CHLORIDE 0.9 % IV BOLUS
1000.0000 mL | Freq: Once | INTRAVENOUS | Status: AC
  Administered 2023-05-23: 1000 mL via INTRAVENOUS

## 2023-05-23 MED ORDER — LACTATED RINGERS IV SOLN: INTRAVENOUS | Status: DC

## 2023-05-23 MED ORDER — SODIUM CHLORIDE 3 % IN NEBU
4.0000 mL | INHALATION_SOLUTION | Freq: Three times a day (TID) | RESPIRATORY_TRACT | Status: DC
  Administered 2023-05-23 – 2023-05-25 (×4): 4 mL via RESPIRATORY_TRACT
  Filled 2023-05-23 (×6): qty 4

## 2023-05-23 MED ORDER — SODIUM CHLORIDE 0.9 % IV SOLN
INTRAVENOUS | Status: AC
Start: 2023-05-23 — End: 2023-05-24

## 2023-05-23 MED ORDER — AMIODARONE HCL 200 MG PO TABS
100.0000 mg | ORAL_TABLET | Freq: Every day | ORAL | Status: DC
  Administered 2023-05-23 – 2023-05-28 (×6): 100 mg via ORAL
  Filled 2023-05-23 (×6): qty 1

## 2023-05-23 NOTE — ED Notes (Signed)
 Assumed pt care from [facility]

## 2023-05-23 NOTE — Hospital Course (Signed)
T

## 2023-05-23 NOTE — ED Provider Notes (Signed)
 [place] EMERGENCY DEPARTMENT AT [facility] Provider Note   CSN: [id number] Arrival date & time: 05/23/23  0001     History  No chief complaint on file.   [name] [name] is a 89 y.o. female.  Patient presents from home via EMS due to a fall on thinners.  Patient with past medical history significant for hypertension, atrial fibrillation, Eliquis usage, pacemaker, chronic diastolic heart failure, GERD, current respiratory infection.  Patient states she has been taking Tessalon Perles and over-the-counter cold medicine for a respiratory infection.  She states she was tested a few days ago for COVID and flu and test were negative.  She denies fevers at home.  She states that this evening she was feeling lightheaded and lost her balance in the bathroom falling.  She did not lose consciousness but endorses hitting her head on either a counter or the floor.  Patient denies vision issues, other pain at this time.  Endorses chest congestion.  Level 2 trauma activation due to fall on thinners  HPI     Home Medications Prior to Admission medications   Medication Sig Start Date End Date Taking? Authorizing Provider  amiodarone (PACERONE) 200 MG tablet TAKE 1/2 TABLET(100 MG) BY MOUTH DAILY 12/24/22   [place], Peter M, MD  amLODipine (NORVASC) 5 MG tablet Take 0.5 tablets (2.5 mg total) by mouth daily. TAKE 1 TABLET(5 MG) BY MOUTH DAILY 11/14/22   [place], Peter M, MD  apixaban [facility]) 2.5 MG TABS tablet TAKE 1 TABLET(2.5 MG) BY MOUTH TWICE DAILY 07/12/22   [place], Peter M, MD  calcium carbonate (OS-CAL) 600 MG TABS Take 600 mg by mouth 2 (two) times daily with a meal.    [provider]  ezetimibe (ZETIA) 10 MG tablet TAKE 1 TABLET BY MOUTH DAILY 02/21/23   [place], Peter M, MD  feeding supplement, ENSURE ENLIVE, (ENSURE ENLIVE) LIQD Take 237 mLs by mouth 2 (two) times daily between meals. Patient taking differently: Take 237 mLs by mouth daily. 10/24/18   Montez Morita, PA-C   fluticasone [facility]) 50 MCG/ACT nasal spray Place 2 sprays into both nostrils daily as needed for allergies (congestion). 06/30/18   [provider]  furosemide (LASIX) 20 MG tablet Take 20 mg every other day.May take 20 mg every day if swelling worsens 11/20/22   [place], Peter M, MD  metoprolol tartrate (LOPRESSOR) 25 MG tablet TAKE 1 AND 1/2 TABLETS(37.5 MG) BY MOUTH TWICE DAILY 03/13/23   [place], Peter M, MD  Multiple Vitamins-Minerals (PRESERVISION AREDS 2) CAPS Take 1 capsule by mouth 2 (two) times daily. Patient not taking: Reported on 04/01/2023    [provider]  mupirocin ointment (BACTROBAN) 2 % Apply 1 Application topically 2 (two) times daily. 05/03/23   Vivi Barrack, DPM  nitroGLYCERIN (NITROSTAT) 0.4 MG SL tablet For chest pain, tightness, or pressure. While sitting, place 1 tablet under tongue. May be used every 5 minutes as needed, for up to 15 minutes. Do not use more than 3 tablets. 03/13/22   [place], Peter M, MD  pantoprazole (PROTONIX) 40 MG tablet Take 1 tablet (40 mg total) by mouth 2 (two) times daily before a meal. 09/21/21   [place], Peter M, MD  polyvinyl alcohol (LIQUIFILM TEARS) 1.4 % ophthalmic solution Place 1-2 drops into both eyes 3 (three) times daily as needed for dry eyes.    [provider]  SYNTHROID 125 MCG tablet Take 125 mcg by mouth daily before breakfast.  08/04/18   [provider]      Allergies    Penicillins, Tetanus toxoid, Demerol, Clindamycin/lincomycin, Letrozole, Meningococcal poly tetanus conj vaccine acwy, Moxifloxacin hcl, and Codeine    Review of Systems   Review of Systems  Physical Exam Updated Vital Signs BP (!) 147/63   Pulse (!) 58   Temp 98.5 F (36.9 C) (Oral)   Resp 18   Ht 5\' 5"  (1.651 m)   Wt 48.1 kg   SpO2 95%   BMI 17.64 kg/m  Physical Exam Vitals and nursing note reviewed.  Constitutional:      General: She is not in acute distress.    Appearance: She is well-developed.  HENT:      Head: Normocephalic and atraumatic.  Eyes:     Conjunctiva/sclera: Conjunctivae normal.  Cardiovascular:     Rate and Rhythm: Normal rate and regular rhythm.  Pulmonary:     Effort: Pulmonary effort is normal. No respiratory distress.     Breath sounds: Rhonchi present.  Abdominal:     Palpations: Abdomen is soft.     Tenderness: There is no abdominal tenderness.  Musculoskeletal:        General: No swelling.     Cervical back: Normal range of motion and neck supple. No tenderness.  Skin:    General: Skin is warm and dry.     Capillary Refill: Capillary refill takes less than 2 seconds.     Comments: Skin tear to right elbow  Neurological:     Mental Status: She is alert.  Psychiatric:        Mood and Affect: Mood normal.     ED Results / Procedures / Treatments   Labs (all labs ordered are listed, but only abnormal results are displayed) Labs Reviewed  COMPREHENSIVE METABOLIC PANEL - Abnormal; Notable for the following components:      Result Value   Sodium 129 (*)    Potassium 5.2 (*)    Chloride 95 (*)    BUN 34 (*)    Creatinine, Ser 1.76 (*)    Total Protein 6.3 (*)    GFR, Estimated 27 (*)    All other components within normal limits  CBC - Abnormal; Notable for the following components:   MCV 102.0 (*)    MCH 34.6 (*)    All other components within normal limits  URINALYSIS, ROUTINE W REFLEX MICROSCOPIC - Abnormal; Notable for the following components:   Color, Urine STRAW (*)    Specific Gravity, Urine 1.004 (*)    All other components within normal limits  PROTIME-INR - Abnormal; Notable for the following components:   Prothrombin Time 15.4 (*)    All other components within normal limits  I-STAT CHEM 8, ED - Abnormal; Notable for the following components:   Sodium 127 (*)    Potassium 5.2 (*)    BUN 33 (*)    Creatinine, Ser 2.00 (*)    All other components within normal limits  ETHANOL    EKG None  Radiology DG Chest Port 1 View Result Date:  05/23/2023 CLINICAL DATA:  Level 2 trauma, fall, cough EXAM: PORTABLE CHEST 1 VIEW COMPARISON:  07/09/2022 FINDINGS: Stable cardiomediastinal silhouette. Coronary stenting. TAVR. Left chest wall pacemaker. Disconnected right chest wall pacer aids. Aortic atherosclerotic calcification. Hyperinflation and chronic bronchitic changes. Chronic blunting of the costophrenic angles. No focal consolidation or pneumothorax. No displaced rib fractures. IMPRESSION: No acute cardiopulmonary disease. COPD. Electronically Signed   By: Minerva Fester M.D.   On: 05/23/2023 00:44  CT HEAD WO CONTRAST Result Date: 05/23/2023 CLINICAL DATA:  Moderate-severe head trauma.  Blunt polytrauma. EXAM: CT HEAD WITHOUT CONTRAST CT CERVICAL SPINE WITHOUT CONTRAST TECHNIQUE: Multidetector CT imaging of the head and cervical spine was performed following the standard protocol without intravenous contrast. Multiplanar CT image reconstructions of the cervical spine were also generated. RADIATION DOSE REDUCTION: This exam was performed according to the departmental dose-optimization program which includes automated exposure control, adjustment of the mA and/or kV according to patient size and/or use of iterative reconstruction technique. COMPARISON:  CT head 09/12/2022 and cervical spine CT 12/17/2020 FINDINGS: CT HEAD FINDINGS Brain: No intracranial hemorrhage, mass effect, or evidence of acute infarct. No hydrocephalus. No extra-axial fluid collection. Age related cerebral atrophy and chronic small vessel ischemic disease. Vascular: No hyperdense vessel. Intracranial arterial calcification. Skull: No fracture or focal lesion. Sinuses/Orbits: No acute finding. Other: None. CT CERVICAL SPINE FINDINGS Alignment: No evidence of traumatic malalignment. Skull base and vertebrae: No acute fracture. No primary bone lesion or focal pathologic process. Soft tissues and spinal canal: No prevertebral fluid or swelling. No visible canal hematoma. Disc  levels: Multilevel spondylosis and facet arthropathy is not significantly changed from 12/17/2020. No severe spinal canal narrowing. Upper chest: No acute abnormality. Other: Carotid calcification. IMPRESSION: 1. No acute intracranial abnormality. 2. No acute fracture in the cervical spine. 3. Chronic sphenoid sinusitis. Electronically Signed   By: Minerva Fester M.D.   On: 05/23/2023 00:42   CT CERVICAL SPINE WO CONTRAST Result Date: 05/23/2023 CLINICAL DATA:  Moderate-severe head trauma.  Blunt polytrauma. EXAM: CT HEAD WITHOUT CONTRAST CT CERVICAL SPINE WITHOUT CONTRAST TECHNIQUE: Multidetector CT imaging of the head and cervical spine was performed following the standard protocol without intravenous contrast. Multiplanar CT image reconstructions of the cervical spine were also generated. RADIATION DOSE REDUCTION: This exam was performed according to the departmental dose-optimization program which includes automated exposure control, adjustment of the mA and/or kV according to patient size and/or use of iterative reconstruction technique. COMPARISON:  CT head 09/12/2022 and cervical spine CT 12/17/2020 FINDINGS: CT HEAD FINDINGS Brain: No intracranial hemorrhage, mass effect, or evidence of acute infarct. No hydrocephalus. No extra-axial fluid collection. Age related cerebral atrophy and chronic small vessel ischemic disease. Vascular: No hyperdense vessel. Intracranial arterial calcification. Skull: No fracture or focal lesion. Sinuses/Orbits: No acute finding. Other: None. CT CERVICAL SPINE FINDINGS Alignment: No evidence of traumatic malalignment. Skull base and vertebrae: No acute fracture. No primary bone lesion or focal pathologic process. Soft tissues and spinal canal: No prevertebral fluid or swelling. No visible canal hematoma. Disc levels: Multilevel spondylosis and facet arthropathy is not significantly changed from 12/17/2020. No severe spinal canal narrowing. Upper chest: No acute abnormality.  Other: Carotid calcification. IMPRESSION: 1. No acute intracranial abnormality. 2. No acute fracture in the cervical spine. 3. Chronic sphenoid sinusitis. Electronically Signed   By: Minerva Fester M.D.   On: 05/23/2023 00:42    Procedures Procedures    Medications Ordered in ED Medications  sodium chloride 0.9 % bolus 1,000 mL (0 mLs Intravenous Stopped 05/23/23 0257)    ED Course/ Medical Decision Making/ A&P                                 Medical Decision Making Amount and/or Complexity of Data Reviewed Labs: ordered. Radiology: ordered.   This patient presents to the ED for concern of fall on thinners with dizziness, this involves an extensive  number of treatment options, and is a complaint that carries with it a high risk of complications and morbidity.  The differential diagnosis includes dehydration, vasovagal syncope, intracranial abnormality, fracture, dislocation, electrolyte abnormality, others   Co morbidities that complicate the patient evaluation  Chronic diastolic heart failure   Additional history obtained:  Additional history obtained from EMS External records from outside source obtained and reviewed including cardiology notes   Lab Tests:  I Ordered, and personally interpreted labs.  The pertinent results include: Sodium 127, potassium 5.2, creatinine 1.76 (baseline appears to be 1.27), UA grossly unremarkable   Imaging Studies ordered:  I ordered imaging studies including CT head and cervical spine without contrast, chest x-ray I independently visualized and interpreted imaging which showed  1. No acute intracranial abnormality.  2. No acute fracture in the cervical spine.  3. Chronic sphenoid sinusitis.  No acute cardiopulmonary disease. COPD  I agree with the radiologist interpretation   Cardiac Monitoring: / EKG:  The patient was maintained on a cardiac monitor.  I personally viewed and interpreted the cardiac monitored which showed an  underlying rhythm of: Sinus rhythm   Consultations Obtained:  I requested consultation with the hospitalist, Dr.Crosley and discussed lab and imaging findings as well as pertinent plan - they recommend: admission   Problem List / ED Course / Critical interventions / Medication management   I ordered medication including normal saline for fluid resuscitation Reevaluation of the patient after these medicines showed that the patient improved I have reviewed the patients home medicines and have made adjustments as needed   Test / Admission - Considered:  Patient with no acute findings on imaging, reassuring in setting of fall on thinners.  Patient with labs concerning for AKI with decreased GFR and increased creatinine.  Feel patient would benefit from admission for further hydration and repeat metabolic panel to ensure improvement in kidney function before discharge.         Final Clinical Impression(s) / ED Diagnoses Final diagnoses:  AKI (acute kidney injury) (HCC)  Fall, initial encounter    Rx / DC Orders ED Discharge Orders     None         Pamala Duffel 05/23/23 0352    Tilden Fossa, MD 05/23/23 [phone]

## 2023-05-23 NOTE — ED Notes (Signed)
 Incontinent of urine. Cleansed, dried, bed changed. Reddness noted to sacrum, mepilex dressing applied. Patient repositioned. A&O x 4, no complaints. Sitting up in bed to eat dinner. Feeding herself.

## 2023-05-23 NOTE — Progress Notes (Signed)
  Paged for MRI clearance.   Pt with abandoned R sided system.   Not an MRI candidate.   Casimiro Needle [address] [place], PA-C  05/23/2023 8:09 AM

## 2023-05-23 NOTE — Hospital Course (Signed)
 Marland Kitchen

## 2023-05-23 NOTE — ED Notes (Signed)
 Patient transported to CT

## 2023-05-23 NOTE — ED Triage Notes (Signed)
 Patient BIB [facility] from home due to fall on thinners. Patient was going to the bathroom when she became dizzy and fell hitting her head. Patient being treated for known URI and states medications have been her dizzy. Patient denies LOC and pain. No vision issues. Patient is A&Ox4. VSS

## 2023-05-23 NOTE — Consult Note (Signed)
 NAME:  [name] [name], MRN:  [id number], DOB:  [birth year], LOS: 0 ADMISSION DATE:  05/23/2023, CONSULTATION DATE:  3/20/225 REFERRING MD:  Dr. Allena Katz - [facility], CHIEF COMPLAINT:  Abnormla CT    History of Present Illness:  [name] [name] is a 89 year old female with a past medical history significant for CKD, CAD s/p DES to LAD, HTN, HLD, osteoarthritis, paroxysmal A-fib on anticoagulation at baseline, s/p TAVR, and anemia who presented to the ED 3/20 via EMS after a mechanical fall with head trauma on anticoagulation.  Patient reports on ambulation to bathroom she became dizzy and fell hitting her head.  Patient reports she has been self-medicating for URI symptoms with Tessalon Perles and over-the-counter cold medications which she attributes to cause of dizziness.  She was also treated with bactrim as an outpatient by PCP. Patient denies loss of consciousness or vision changes post fall.  On ED arrival patient was seen hemodynamically stable with only mild hypertension.  Lab work significant for sodium 129, potassium 5.2, chloride 95, creatinine 1.75, INR within normal at 1.2.  Head and cervical spine CT negative for any acute abnormalities.  CT chest with no acute traumatic injuries but concerning for possible underlying MAC for which PCCM was consulted  Pertinent  Medical History  CKD, CAD s/p DES to LAD, HTN, HLD, osteoarthritis, paroxysmal A-fib on anticoagulation at baseline, s/p TAVR, and anemia  Significant Hospital Events: Including procedures, antibiotic start and stop dates in addition to other pertinent events   3/20 presented after mechanical fall with concern of head trauma on anticoagulation.  No traumatic injuries seen on CT.  Patient was admitted for mild AKI.  PCCM consulted for abnormal chest CT  Interim History / Subjective:  As above  Objective   Blood pressure (!) 141/63, pulse 62, temperature 98.1 F (36.7 C), temperature source Oral, resp. rate 18, height 5\' 5"  (1.651 m),  weight 48.1 kg, SpO2 95%.        Intake/Output Summary (Last 24 hours) at 05/23/2023 1714 Last data filed at 05/23/2023 0754 Gross per 24 hour  Intake 1200 ml  Output --  Net 1200 ml   Filed Weights   05/23/23 0012  Weight: 48.1 kg    Examination: General: Acute on chronically ill appearing elderly severely decondition female lying on ED stretcher, in NAD HEENT: [place]/AT, MM pink/moist, PERRL,  Neuro: normal speech, moves all 4 extremities CV: s1s2 regular rate and rhythm, no murmur, rubs, or gallops,  PULM:  on room air, expiratory wheezing, severe kyphosis GI: scaphoid, soft Extremities: warm/dry, no edema, decreased muscle bulk and tone Skin: thin skin   Resolved Hospital Problem list     Assessment & Plan:  Community acquired pneumonia - failed outpatient therapy Abnormal CT Chest concerning for MAI infection Bronchiectasis, lower lobe predominant. AKI and hyperkalemia in the setting of bactrim use Dehydration (hyponatremia with clinical hypovolemia)  P: Needs IVF resuscitation. Encourage enteral nutrition and hydration. Obtain RVP - ordered.  Patient would prefer to avoid bronchoscopy with which I agree.  Check sputum culture - expectorated for afb and standard gramstain/culture TID airway clearance with albuterol, nebulized saline, followed by chest PT.  Mobilize as able with PT/OT  Discussed the concern for MAI infection as well as arduous treatment course if positive cultures for AFB x2 on expectorated sputum. This is not palatable to most people, and is not for her.   Her sister actually has MAI infection and is not on quadruple therapy, but rather she gets PRN  abx courses. We discussed how that is a viable option but does put her at risk for antibacterial resistance - which may be acceptable given limited life expectancy. Emphasis on quality of life rather than quantity. It is possible that regular airway clearance could also dramatically improve her symptoms and  organism burden. Avoid ICS  Code status also discussed - she is DNR would not desire intubation or any aggressive life sustaining measures.   Pccm will follow  Durel Salts, MD Pulmonary and Critical Care Medicine [facility] 05/23/2023 6:32 PM Pager: see AMION  If no response to pager, please call critical care on call (see AMION) until 7pm After 7:00 pm call Elink     Labs   CBC: Recent Labs  Lab 05/23/23 0006 05/23/23 0027  WBC 5.3  --   HGB 14.1 14.3  HCT 41.5 42.0  MCV 102.0*  --   PLT 154  --     Basic Metabolic Panel: Recent Labs  Lab 05/23/23 0006 05/23/23 0027  NA 129* 127*  K 5.2* 5.2*  CL 95* 98  CO2 22  --   GLUCOSE 97 91  BUN 34* 33*  CREATININE 1.76* 2.00*  CALCIUM 9.8  --    GFR: Estimated Creatinine Clearance: 13.9 mL/min (A) (by C-G formula based on SCr of 2 mg/dL (H)). Recent Labs  Lab 05/23/23 0006  WBC 5.3    Liver Function Tests: Recent Labs  Lab 05/23/23 0006  AST 31  ALT 20  ALKPHOS 65  BILITOT 0.9  PROT 6.3*  ALBUMIN 3.8   No results for input(s): "LIPASE", "AMYLASE" in the last 168 hours. No results for input(s): "AMMONIA" in the last 168 hours.  ABG    Component Value Date/Time   PHART 7.432 01/29/2017 0958   PCO2ART 40.6 01/29/2017 0958   PO2ART 223.0 (H) 01/29/2017 0958   HCO3 27.2 01/29/2017 0958   TCO2 24 05/23/2023 0027   O2SAT 100.0 01/29/2017 0958     Coagulation Profile: Recent Labs  Lab 05/23/23 0006  INR 1.2    Cardiac Enzymes: No results for input(s): "CKTOTAL", "CKMB", "CKMBINDEX", "TROPONINI" in the last 168 hours.  HbA1C: Hgb A1c MFr Bld  Date/Time Value Ref Range Status  01/28/2017 09:00 AM 5.5 4.8 - 5.6 % Final    Comment:    (NOTE)         Prediabetes: 5.7 - 6.4         Diabetes: >6.4         Glycemic control for adults with diabetes: <7.0     CBG: No results for input(s): "GLUCAP" in the last 168 hours.  Review of Systems:   Shortness of breath, productive cough, no  hemoptysis, no nausea vomiting diarrhea. Appetite is ok.  +syncope and weakness. Rest as in HPI  Past Medical History:  She,  has a past medical history of Anemia, CKD (chronic kidney disease), Complication of anesthesia, Coronary artery disease, Diverticulosis, Family history of adverse reaction to anesthesia, Family history of breast cancer (09/01/2020), GERD (gastroesophageal reflux disease), Hemorrhoids, Hyperlipidemia, Hypertension, Hypothyroidism, Osteoarthritis, Paroxysmal atrial fibrillation (HCC), Personal history of skin cancer (09/01/2020), PONV (postoperative nausea and vomiting), Presence of permanent cardiac pacemaker, and S/P TAVR (transcatheter aortic valve replacement) (01/29/2017).   Surgical History:   Past Surgical History:  Procedure Laterality Date   ABDOMINAL HYSTERECTOMY  1980   BASAL CELL CARCINOMA EXCISION     BREAST LUMPECTOMY WITH RADIOACTIVE SEED LOCALIZATION Left 10/04/2020   Procedure: LEFT BREAST LUMPECTOMY WITH RADIOACTIVE SEED  LOCALIZATION;  Surgeon: Griselda Miner, MD;  Location: [facility] OR;  Service: General;  Laterality: Left;   CARDIAC CATHETERIZATION  ~ 11/2016   CATARACT EXTRACTION W/ INTRAOCULAR LENS  IMPLANT, BILATERAL Bilateral    CORONARY ANGIOPLASTY WITH STENT PLACEMENT  12/20/2016   "1 stent"   CORONARY STENT INTERVENTION N/A 12/20/2016   Procedure: CORONARY STENT INTERVENTION;  Surgeon: Swaziland, Peter M, MD;  Location[facility] INVASIVE CV LAB;  Service: Cardiovascular;  Laterality: N/A;   FINGER SURGERY Left    "fell; developed skiers thumb; had to operate on it"   INSERT / REPLACE / REMOVE PACEMAKER  1993   for syptomatic bradycardia and syncope -- in [facility]   INTRAMEDULLARY (IM) NAIL INTERTROCHANTERIC Left 10/19/2018   Procedure: INTRAMEDULLARY (IM) NAIL INTERTROCHANTRIC;  Surgeon: Myrene Galas, MD;  Location: [facility] OR;  Service: Orthopedics;  Laterality: Left;   LEAD REVISION/REPAIR N/A 10/09/2016   New left subclavian MDT Adapta L PPM dual chamber  system implanted by Dr Johney Frame with previously placed R subclavian system abandoned   PACEMAKER GENERATOR CHANGE  2001   pulse generator replacement by Dr. March Rummage GENERATOR CHANGE  04/01/2008   PPM Medtronic -- model # ADDRL1 serial # [id number] -- pulse generator replacement by Dr. Reyes Ivan    RIGHT/LEFT HEART CATH AND CORONARY ANGIOGRAPHY N/A 11/27/2016   Procedure: RIGHT/LEFT HEART CATH AND CORONARY ANGIOGRAPHY;  Surgeon: Swazil[place], Peter M, MD;  Location: [facility] INVASIVE CV LAB;  Service: Cardiovascular;  Laterality: N/A;   SQUAMOUS CELL CARCINOMA EXCISION     TEE WITHOUT CARDIOVERSION N/A 01/29/2017   Procedure: TRANSESOPHAGEAL ECHOCARDIOGRAM (TEE);  Surgeon: Tonny Bollman, MD;  Location: [facility] OR;  Service: Open Heart Surgery;  Laterality: N/A;   TONSILLECTOMY     TRANSCATHETER AORTIC VALVE REPLACEMENT, TRANSFEMORAL N/A 01/29/2017   Procedure: TRANSCATHETER AORTIC VALVE REPLACEMENT, TRANSFEMORAL;  Surgeon: Tonny Bollman, MD;  Location: [facility] OR;  Service: Open Heart Surgery;  Laterality: N/A;     Social History:   reports that she quit smoking about 45 years ago. Her smoking use included cigarettes. She started smoking about 75 years ago. She has a 45 pack-year smoking history. She has never used smokeless tobacco. She reports that she does not drink alcohol and does not use drugs.   Family History:  Her family history includes Alzheimer's disease in her mother; Breast cancer in her cousin; Breast cancer (age of onset: 87) in her maternal aunt; Kidney cancer in her father.   Allergies Allergies  Allergen Reactions   Penicillins Anaphylaxis and Other (See Comments)    Has patient had a PCN reaction causing immediate rash, facial/tongue/throat swelling, SOB or lightheadedness with hypotension: Yes Has patient had a PCN reaction causing severe rash involving mucus membranes or skin necrosis: No Has patient had a PCN reaction that required hospitalization: No Has patient had a PCN  reaction occurring within the last 10 years: No If all of the above answers are "NO", then may proceed with Cephalosporin use.    Tetanus Toxoid Anaphylaxis   Demerol Nausea Only    Severe nausea   Clindamycin/Lincomycin     Sores in mouth   Letrozole Other (See Comments)   Meningococcal Poly Tetanus Conj Vaccine Acwy Other (See Comments)   Moxifloxacin Hcl Other (See Comments)   Codeine Nausea Only    Severe nausea     Home Medications  Prior to Admission medications   Medication Sig Start Date End Date Taking? Authorizing Provider  albuterol (VENTOLIN HFA) 108 (90  Base) MCG/ACT inhaler Inhale 1 puff into the lungs every 6 (six) hours as needed for shortness of breath or wheezing. 05/20/23  Yes [provider]  amiodarone (PACERONE) 200 MG tablet TAKE 1/2 TABLET(100 MG) BY MOUTH DAILY 12/24/22  Yes [place], Peter M, MD  amLODipine (NORVASC) 5 MG tablet Take 0.5 tablets (2.5 mg total) by mouth daily. TAKE 1 TABLET(5 MG) BY MOUTH DAILY 11/14/22  Yes [place], Peter M, MD  apixaban (ELIQUIS) 2.5 MG TABS tablet TAKE 1 TABLET(2.5 MG) BY MOUTH TWICE DAILY 07/12/22  Yes [place], Peter M, MD  benzonatate (TESSALON) 200 MG capsule Take 200 mg by mouth 3 (three) times daily as needed. 05/20/23  Yes [provider]  calcium carbonate (OS-CAL) 600 MG TABS Take 600 mg by mouth 2 (two) times daily with a meal.   Yes [provider]  ezetimibe (ZETIA) 10 MG tablet TAKE 1 TABLET BY MOUTH DAILY 02/21/23  Yes [place], Peter M, MD  feeding supplement, ENSURE ENLIVE, (ENSURE ENLIVE) LIQD Take 237 mLs by mouth 2 (two) times daily between meals. Patient taking differently: Take 237 mLs by mouth daily. 10/24/18  Yes Montez Morita, PA-C  fluticasone [facility]) 50 MCG/ACT nasal spray Place 2 sprays into both nostrils daily as needed for allergies (congestion). 06/30/18  Yes [provider]  furosemide (LASIX) 20 MG tablet Take 20 mg every other day.May take 20 mg every day if swelling  worsens 11/20/22  Yes [place], Peter M, MD  metoprolol tartrate (LOPRESSOR) 25 MG tablet TAKE 1 AND 1/2 TABLETS(37.5 MG) BY MOUTH TWICE DAILY 03/13/23  Yes [place], Peter M, MD  Multiple Vitamins-Minerals (PRESERVISION AREDS 2) CAPS Take 1 capsule by mouth 2 (two) times daily.   Yes [provider]  nitroGLYCERIN (NITROSTAT) 0.4 MG SL tablet For chest pain, tightness, or pressure. While sitting, place 1 tablet under tongue. May be used every 5 minutes as needed, for up to 15 minutes. Do not use more than 3 tablets. 03/13/22  Yes [place], Peter M, MD  pantoprazole (PROTONIX) 40 MG tablet Take 1 tablet (40 mg total) by mouth 2 (two) times daily before a meal. 09/21/21  Yes [place], Peter M, MD  polyvinyl alcohol (LIQUIFILM TEARS) 1.4 % ophthalmic solution Place 1-2 drops into both eyes 3 (three) times daily as needed for dry eyes.   Yes [provider]  sulfamethoxazole-trimethoprim (BACTRIM DS) 800-160 MG tablet Take 1 tablet by mouth 2 (two) times daily. 05/17/23  Yes [provider]  SYNTHROID 125 MCG tablet Take 125 mcg by mouth daily before breakfast.  08/04/18  Yes [provider]  mupirocin ointment (BACTROBAN) 2 % Apply 1 Application topically 2 (two) times daily. Patient not taking: Reported on 05/23/2023 05/03/23   Vivi Barrack, DPM

## 2023-05-23 NOTE — H&P (Addendum)
 History and Physical    Patient: [name] [name] XBM:[id number] DOB: 1929/08/05 DOA: 05/23/2023 DOS: the patient was seen and examined on 05/23/2023 PCP: Creola Corn, MD  Patient coming from: Home Chief complaint: Fall.  HPI:  [name] TSUTSUI is a 89 y.o. female with past medical history  of  paroxysmal atrial fibrillation with tachy-brady syndrome and is s/p pacemaker implant. She is on chronic anticoagulation with Eliquis --presents after a fall, level 2 trauma. She became lightheaded hit her head in the restroom. .  Patient also has past medical history of CKD stage III AA, GERD, hypothyroidism, heart disease status post stents, allergies to 8 medications please see complete list which includes penicillin tetanus toxoid Demerol clindamycin letrozole meningococcal vaccine moxifloxacin codeine, patient has had aortic valve replacement is status post TAVR on January 29, 2017, essential hypertension, is followed closely by cardiology.  Was last seen by cardiology in January 2025. However, patient has had a respiratory infection for the past 6 days.    >>ED Course: Pt in ed is alert awake oriented vitals are stable as below patient's afebrile maintaining O2 sats at 91% on room air. >>Vital signs in the ED were notable for the following:  Vitals:   05/23/23 1115 05/23/23 1119 05/23/23 1345 05/23/23 1519  BP: (!) 170/93  (!) 141/63   Pulse: 69  62   Temp:  97.8 F (36.6 C)  98.1 F (36.7 C)  Resp: 19  18   Height:      Weight:      SpO2: 93%  95%   TempSrc:  Oral  Oral  BMI (Calculated):       >>Labs were notable for the following: Metabolic panel showing hyponatremia of 129 potassium 5.2 chloride 95 BUN of 34 abnormal creatinine of 1.76 with a EGFR of 27, normal LFTs. CBC shows normal white count normal hemoglobin normal platelet count macrocytosis of 102 without anemia. INR of 1.2. Urinalysis today is straw-colored clear and otherwise normal.  >>EKG: Independently reviewed: Sinus  rhythm 61 PR of 191 QRS 88 QTc of 418 nonspecific T wave changes in V1 and V2 previous EKG in May 2024 shows atrial paced rhythm with a PR of 236 and QTc of 4 1 otherwise similar to previous EKG.  >> 2D Echo June 2024: 1. Left ventricular ejection fraction, by estimation, is 60 to 65%. Left  ventricular ejection fraction by 3D volume is 64 %. The left ventricle has  normal function. The left ventricle has no regional wall motion  abnormalities. Left ventricular diastolic   parameters are consistent with Grade II diastolic dysfunction  (pseudonormalization). The average left ventricular global longitudinal  strain is -20.9 %. The global longitudinal strain is normal.   2. Right ventricular systolic function is mildly reduced. The right  ventricular size is mildly enlarged. There is normal pulmonary artery  systolic pressure.   3. Left atrial size was mildly dilated.   4. Right atrial size was mildly dilated.   5. The mitral valve is degenerative. Moderate mitral valve regurgitation.   6. Tricuspid valve regurgitation is severe.   7. The aortic valve has been repaired/replaced. There is a 23 mm Edwards  Sapien prosthetic (TAVR) valve present in the aortic position. Procedure  Date: 01/29/2017. Aortic valve mean gradient measures 8.0 mmHg. Aortic  valve Vmax measures 1.91 m/s. DI  0.57. There is mild to moderate paravalvular leak in the 1-2 o'clock  position which appears about 1/2 grade worse when compared to prior study.  8. The inferior vena cava is dilated in size with <50% respiratory  variability, suggesting right atrial pressure of 15 mmHg.    >>Imaging and additional notable ED work-up:  Chest x-ray is negative for any acute cardiopulmonary disease. Head CT is negative for any acute intracranial abnormality no acute fracture of the cervical spine, chronic sphenoid sinusitis. CT of the spine is negative for any acute intracranial abnormalities but does show multilevel spondylosis  and facet arthropathy that is unchanged.  >>While in the ED patient received the following: Medications  0.9 %  sodium chloride infusion ( Intravenous New Bag/Given 05/23/23 0412)  sodium chloride 0.9 % bolus 1,000 mL (0 mLs Intravenous Stopped 05/23/23 0257)   Review of Systems  Unable to perform ROS: Age   Past Medical History:  Diagnosis Date   Anemia    CKD (chronic kidney disease)    Complication of anesthesia    Coronary artery disease    a. 12/20/2016: s/p DES to LAD    Diverticulosis    Family history of adverse reaction to anesthesia    sister also has post-op n/v   Family history of breast cancer 09/01/2020   GERD (gastroesophageal reflux disease)    Hemorrhoids    Hyperlipidemia    Hypertension    Hypothyroidism    Osteoarthritis    Paroxysmal atrial fibrillation (HCC)    a. on OAC with Xarelto but switched to Eliquis after stenting.    Personal history of skin cancer 09/01/2020   PONV (postoperative nausea and vomiting)    Presence of permanent cardiac pacemaker    S/P TAVR (transcatheter aortic valve replacement) 01/29/2017   23 mm Edwards Sapien 3 transcatheter heart valve placed via percutaneous right transfemoral approach    Past Surgical History:  Procedure Laterality Date   ABDOMINAL HYSTERECTOMY  1980   BASAL CELL CARCINOMA EXCISION     BREAST LUMPECTOMY WITH RADIOACTIVE SEED LOCALIZATION Left 10/04/2020   Procedure: LEFT BREAST LUMPECTOMY WITH RADIOACTIVE SEED LOCALIZATION;  Surgeon: Griselda Miner, MD;  Location: [facility] OR;  Service: General;  Laterality: Left;   CARDIAC CATHETERIZATION  ~ 11/2016   CATARACT EXTRACTION W/ INTRAOCULAR LENS  IMPLANT, BILATERAL Bilateral    CORONARY ANGIOPLASTY WITH STENT PLACEMENT  12/20/2016   "1 stent"   CORONARY STENT INTERVENTION N/A 12/20/2016   Procedure: CORONARY STENT INTERVENTION;  Surgeon: Swaziland, Peter M, MD;  Location: [facility] INVASIVE CV LAB;  Service: Cardiovascular;  Laterality: N/A;   FINGER SURGERY Left    "fell;  developed skiers thumb; had to operate on it"   INSERT / REPLACE / REMOVE PACEMAKER  1993   for syptomatic bradycardia and syncope -- in [facility]   INTRAMEDULLARY (IM) NAIL INTERTROCHANTERIC Left 10/19/2018   Procedure: INTRAMEDULLARY (IM) NAIL INTERTROCHANTRIC;  Surgeon: Myrene Galas, MD;  Location: [facility] OR;  Service: Orthopedics;  Laterality: Left;   LEAD REVISION/REPAIR N/A 10/09/2016   New left subclavian MDT Adapta L PPM dual chamber system implanted by Dr Johney Frame with previously placed R subclavian system abandoned   PACEMAKER GENERATOR CHANGE  2001   pulse generator replacement by Dr. March Rummage GENERATOR CHANGE  04/01/2008   PPM Medtronic -- model # ADDRL1 serial # [id number] -- pulse generator replacement by Dr. Reyes Ivan    RIGHT/LEFT HEART CATH AND CORONARY ANGIOGRAPHY N/A 11/27/2016   Procedure: RIGHT/LEFT HEART CATH AND CORONARY ANGIOGRAPHY;  Surgeon: Swaziland, Peter M, MD;  Location: [facility] INVASIVE CV LAB;  Service: Cardiovascular;  Laterality: N/A;   SQUAMOUS CELL  CARCINOMA EXCISION     TEE WITHOUT CARDIOVERSION N/A 01/29/2017   Procedure: TRANSESOPHAGEAL ECHOCARDIOGRAM (TEE);  Surgeon: Tonny Bollman, MD;  Location: [facility] OR;  Service: Open Heart Surgery;  Laterality: N/A;   TONSILLECTOMY     TRANSCATHETER AORTIC VALVE REPLACEMENT, TRANSFEMORAL N/A 01/29/2017   Procedure: TRANSCATHETER AORTIC VALVE REPLACEMENT, TRANSFEMORAL;  Surgeon: Tonny Bollman, MD;  Location: [facility] OR;  Service: Open Heart Surgery;  Laterality: N/A;    reports that she quit smoking about 45 years ago. Her smoking use included cigarettes. She started smoking about 75 years ago. She has a 45 pack-year smoking history. She has never used smokeless tobacco. She reports that she does not drink alcohol and does not use drugs.  Allergies  Allergen Reactions   Penicillins Anaphylaxis and Other (See Comments)    Has patient had a PCN reaction causing immediate rash, facial/tongue/throat swelling, SOB or  lightheadedness with hypotension: Yes Has patient had a PCN reaction causing severe rash involving mucus membranes or skin necrosis: No Has patient had a PCN reaction that required hospitalization: No Has patient had a PCN reaction occurring within the last 10 years: No If all of the above answers are "NO", then may proceed with Cephalosporin use.    Tetanus Toxoid Anaphylaxis   Demerol Nausea Only    Severe nausea   Clindamycin/Lincomycin     Sores in mouth   Letrozole Other (See Comments)   Meningococcal Poly Tetanus Conj Vaccine Acwy Other (See Comments)   Moxifloxacin Hcl Other (See Comments)   Codeine Nausea Only    Severe nausea    Family History  Problem Relation Age of Onset   Alzheimer's disease Mother    Kidney cancer Father        dx 76s   Breast cancer Maternal Aunt 68   Breast cancer Cousin        maternal cousin, dx >50    Prior to Admission medications   Medication Sig Start Date End Date Taking? Authorizing Provider  albuterol (VENTOLIN HFA) 108 (90 Base) MCG/ACT inhaler Inhale 1 puff into the lungs every 6 (six) hours as needed for shortness of breath or wheezing. 05/20/23  Yes [provider]  amiodarone (PACERONE) 200 MG tablet TAKE 1/2 TABLET(100 MG) BY MOUTH DAILY 12/24/22  Yes [place], Peter M, MD  amLODipine (NORVASC) 5 MG tablet Take 0.5 tablets (2.5 mg total) by mouth daily. TAKE 1 TABLET(5 MG) BY MOUTH DAILY 11/14/22  Yes [place], Peter M, MD  apixaban (ELIQUIS) 2.5 MG TABS tablet TAKE 1 TABLET(2.5 MG) BY MOUTH TWICE DAILY 07/12/22  Yes [place], Peter M, MD  benzonatate (TESSALON) 200 MG capsule Take 200 mg by mouth 3 (three) times daily as needed. 05/20/23  Yes [provider]  calcium carbonate (OS-CAL) 600 MG TABS Take 600 mg by mouth 2 (two) times daily with a meal.   Yes [provider]  ezetimibe (ZETIA) 10 MG tablet TAKE 1 TABLET BY MOUTH DAILY 02/21/23  Yes [place], Peter M, MD  feeding supplement, ENSURE ENLIVE, (ENSURE ENLIVE)  LIQD Take 237 mLs by mouth 2 (two) times daily between meals. Patient taking differently: Take 237 mLs by mouth daily. 10/24/18  Yes Montez Morita, PA-C  fluticasone [facility]) 50 MCG/ACT nasal spray Place 2 sprays into both nostrils daily as needed for allergies (congestion). 06/30/18  Yes [provider]  furosemide (LASIX) 20 MG tablet Take 20 mg every other day.May take 20 mg every day if swelling worsens 11/20/22  Yes [place], Peter M, MD  metoprolol tartrate (LOPRESSOR) 25 MG tablet TAKE 1 AND 1/2 TABLETS(37.5 MG) BY MOUTH TWICE DAILY 03/13/23  Yes [place], Peter M, MD  Multiple Vitamins-Minerals (PRESERVISION AREDS 2) CAPS Take 1 capsule by mouth 2 (two) times daily.   Yes [provider]  nitroGLYCERIN (NITROSTAT) 0.4 MG SL tablet For chest pain, tightness, or pressure. While sitting, place 1 tablet under tongue. May be used every 5 minutes as needed, for up to 15 minutes. Do not use more than 3 tablets. 03/13/22  Yes [place], Peter M, MD  pantoprazole (PROTONIX) 40 MG tablet Take 1 tablet (40 mg total) by mouth 2 (two) times daily before a meal. 09/21/21  Yes [place], Peter M, MD  polyvinyl alcohol (LIQUIFILM TEARS) 1.4 % ophthalmic solution Place 1-2 drops into both eyes 3 (three) times daily as needed for dry eyes.   Yes [provider]  sulfamethoxazole-trimethoprim (BACTRIM DS) 800-160 MG tablet Take 1 tablet by mouth 2 (two) times daily. 05/17/23  Yes [provider]  SYNTHROID 125 MCG tablet Take 125 mcg by mouth daily before breakfast.  08/04/18  Yes [provider]  mupirocin ointment (BACTROBAN) 2 % Apply 1 Application topically 2 (two) times daily. Patient not taking: Reported on 05/23/2023 05/03/23   Vivi Barrack, [place]                                                                                 Vitals:   05/23/23 1115 05/23/23 1119 05/23/23 1345 05/23/23 1519  BP: (!) 170/93  (!) 141/63   Pulse: 69  62   Resp: 19  18   Temp:  97.8 F (36.6 C)   98.1 F (36.7 C)  TempSrc:  Oral  Oral  SpO2: 93%  95%   Weight:      Height:       Physical Exam Vitals and nursing note reviewed.  Constitutional:      General: She is not in acute distress.    Appearance: She is underweight.  HENT:     Head: Normocephalic and atraumatic.     Right Ear: Hearing and external ear normal.     Left Ear: Hearing and external ear normal.     Nose: Nose normal. No nasal deformity.     Mouth/Throat:     Lips: Pink.     Tongue: No lesions.     Pharynx: Oropharynx is clear.  Eyes:     General: Lids are normal.     Extraocular Movements: Extraocular movements intact.  Cardiovascular:     Rate and Rhythm: Normal rate and regular rhythm.     Heart sounds: Normal heart sounds.  Pulmonary:     Effort: Pulmonary effort is normal.     Breath sounds: Examination of the right-upper field reveals rales. Examination of the left-upper field reveals rales. Examination of the right-middle field reveals rales. Examination of the left-middle field reveals rales. Examination of the right-lower field reveals rales. Examination of the left-lower field reveals rales. Rales present.  Abdominal:     General: Bowel sounds are normal. There is no distension.     Palpations: Abdomen is soft. There is no mass.     Tenderness:  There is no abdominal tenderness.  Musculoskeletal:     Right lower leg: No edema.     Left lower leg: No edema.  Skin:    General: Skin is warm.  Neurological:     General: No focal deficit present.     Mental Status: She is alert and oriented to person, place, and time.     Cranial Nerves: Cranial nerves 2-12 are intact.  Psychiatric:        Attention and Perception: Attention normal.        Mood and Affect: Mood normal.        Speech: Speech normal.        Behavior: Behavior normal. Behavior is cooperative.      Labs on Admission: I have personally reviewed following labs and imaging studies  CBC: Recent Labs  Lab 05/23/23 0006  05/23/23 0027  WBC 5.3  --   HGB 14.1 14.3  HCT 41.5 42.0  MCV 102.0*  --   PLT 154  --    Basic Metabolic Panel: Recent Labs  Lab 05/23/23 0006 05/23/23 0027  NA 129* 127*  K 5.2* 5.2*  CL 95* 98  CO2 22  --   GLUCOSE 97 91  BUN 34* 33*  CREATININE 1.76* 2.00*  CALCIUM 9.8  --    GFR: Estimated Creatinine Clearance: 13.9 mL/min (A) (by C-G formula based on SCr of 2 mg/dL (H)). Liver Function Tests: Recent Labs  Lab 05/23/23 0006  AST 31  ALT 20  ALKPHOS 65  BILITOT 0.9  PROT 6.3*  ALBUMIN 3.8   No results for input(s): "LIPASE", "AMYLASE" in the last 168 hours. No results for input(s): "AMMONIA" in the last 168 hours. Coagulation Profile: Recent Labs  Lab 05/23/23 0006  INR 1.2   Cardiac Enzymes: No results for input(s): "CKTOTAL", "CKMB", "CKMBINDEX", "TROPONINI" in the last 168 hours. BNP (last 3 results) No results for input(s): "PROBNP" in the last 8760 hours. HbA1C: No results for input(s): "HGBA1C" in the last 72 hours. CBG: No results for input(s): "GLUCAP" in the last 168 hours. Lipid Profile: No results for input(s): "CHOL", "HDL", "LDLCALC", "TRIG", "CHOLHDL", "LDLDIRECT" in the last 72 hours. Thyroid Function Tests: No results for input(s): "TSH", "T4TOTAL", "FREET4", "T3FREE", "THYROIDAB" in the last 72 hours. Anemia Panel: No results for input(s): "VITAMINB12", "FOLATE", "FERRITIN", "TIBC", "IRON", "RETICCTPCT" in the last 72 hours. Urine analysis:    Component Value Date/Time   COLORURINE STRAW (A) 05/23/2023 0231   APPEARANCEUR CLEAR 05/23/2023 0231   LABSPEC 1.004 (L) 05/23/2023 0231   PHURINE 7.0 05/23/2023 0231   GLUCOSEU NEGATIVE 05/23/2023 0231   HGBUR NEGATIVE 05/23/2023 0231   BILIRUBINUR NEGATIVE 05/23/2023 0231   KETONESUR NEGATIVE 05/23/2023 0231   PROTEINUR NEGATIVE 05/23/2023 0231   NITRITE NEGATIVE 05/23/2023 0231   LEUKOCYTESUR NEGATIVE 05/23/2023 0231   Radiological Exams on Admission: DG Chest Port 1 View Result  Date: 05/23/2023 CLINICAL DATA:  Level 2 trauma, fall, cough EXAM: PORTABLE CHEST 1 VIEW COMPARISON:  07/09/2022 FINDINGS: Stable cardiomediastinal silhouette. Coronary stenting. TAVR. Left chest wall pacemaker. Disconnected right chest wall pacer aids. Aortic atherosclerotic calcification. Hyperinflation and chronic bronchitic changes. Chronic blunting of the costophrenic angles. No focal consolidation or pneumothorax. No displaced rib fractures. IMPRESSION: No acute cardiopulmonary disease. COPD. Electronically Signed   By: Minerva Fester M.D.   On: 05/23/2023 00:44   CT HEAD WO CONTRAST Result Date: 05/23/2023 CLINICAL DATA:  Moderate-severe head trauma.  Blunt polytrauma. EXAM: CT HEAD WITHOUT CONTRAST CT CERVICAL  SPINE WITHOUT CONTRAST TECHNIQUE: Multidetector CT imaging of the head and cervical spine was performed following the standard protocol without intravenous contrast. Multiplanar CT image reconstructions of the cervical spine were also generated. RADIATION DOSE REDUCTION: This exam was performed according to the departmental dose-optimization program which includes automated exposure control, adjustment of the mA and/or kV according to patient size and/or use of iterative reconstruction technique. COMPARISON:  CT head 09/12/2022 and cervical spine CT 12/17/2020 FINDINGS: CT HEAD FINDINGS Brain: No intracranial hemorrhage, mass effect, or evidence of acute infarct. No hydrocephalus. No extra-axial fluid collection. Age related cerebral atrophy and chronic small vessel ischemic disease. Vascular: No hyperdense vessel. Intracranial arterial calcification. Skull: No fracture or focal lesion. Sinuses/Orbits: No acute finding. Other: None. CT CERVICAL SPINE FINDINGS Alignment: No evidence of traumatic malalignment. Skull base and vertebrae: No acute fracture. No primary bone lesion or focal pathologic process. Soft tissues and spinal canal: No prevertebral fluid or swelling. No visible canal hematoma.  Disc levels: Multilevel spondylosis and facet arthropathy is not significantly changed from 12/17/2020. No severe spinal canal narrowing. Upper chest: No acute abnormality. Other: Carotid calcification. IMPRESSION: 1. No acute intracranial abnormality. 2. No acute fracture in the cervical spine. 3. Chronic sphenoid sinusitis. Electronically Signed   By: Minerva Fester M.D.   On: 05/23/2023 00:42   CT CERVICAL SPINE WO CONTRAST Result Date: 05/23/2023 CLINICAL DATA:  Moderate-severe head trauma.  Blunt polytrauma. EXAM: CT HEAD WITHOUT CONTRAST CT CERVICAL SPINE WITHOUT CONTRAST TECHNIQUE: Multidetector CT imaging of the head and cervical spine was performed following the standard protocol without intravenous contrast. Multiplanar CT image reconstructions of the cervical spine were also generated. RADIATION DOSE REDUCTION: This exam was performed according to the departmental dose-optimization program which includes automated exposure control, adjustment of the mA and/or kV according to patient size and/or use of iterative reconstruction technique. COMPARISON:  CT head 09/12/2022 and cervical spine CT 12/17/2020 FINDINGS: CT HEAD FINDINGS Brain: No intracranial hemorrhage, mass effect, or evidence of acute infarct. No hydrocephalus. No extra-axial fluid collection. Age related cerebral atrophy and chronic small vessel ischemic disease. Vascular: No hyperdense vessel. Intracranial arterial calcification. Skull: No fracture or focal lesion. Sinuses/Orbits: No acute finding. Other: None. CT CERVICAL SPINE FINDINGS Alignment: No evidence of traumatic malalignment. Skull base and vertebrae: No acute fracture. No primary bone lesion or focal pathologic process. Soft tissues and spinal canal: No prevertebral fluid or swelling. No visible canal hematoma. Disc levels: Multilevel spondylosis and facet arthropathy is not significantly changed from 12/17/2020. No severe spinal canal narrowing. Upper chest: No acute  abnormality. Other: Carotid calcification. IMPRESSION: 1. No acute intracranial abnormality. 2. No acute fracture in the cervical spine. 3. Chronic sphenoid sinusitis. Electronically Signed   By: Minerva Fester M.D.   On: 05/23/2023 00:42   Data Reviewed: Relevant notes from primary care and specialist visits, past discharge summaries as available in EHR, including Care Everywhere. Prior diagnostic testing as pertinent to current admission diagnoses, Updated medications and problem lists for reconciliation ED course, including vitals, labs, imaging, treatment and response to treatment,Triage notes, nursing and pharmacy notes and ED provider's notes Notable results as noted in HPI.Discussed case with EDMD/ ED APP/ or Specialty MD on call and as needed.    Assessment & Plan   >> Fall at home: Differentials include decreased p.o. intake dehydration, associated with recent upper respiratory tract infection, or illness, patient may need pacemaker interrogation, will examine and decide if cardiology consult is needed in his case.  Unclear if  this is presyncope or syncope.  Fall precautions in the meantime aspiration and ambulation with assistance out of bed.  Ideally patient would benefit from an MRI however due to pacemaker will check with radiology if we can manage that here.  Med list reviewed diuretic low-dose do not suspect orthostatic blood pressure as a differential diagnosis in his case however we will check.  Initial blood pressure on presentation is high which could also be from the pain as the patient had struck his head.   >> Paroxysmal atrial fibrillation: Currently patient's rate and rhythm is sinus and regular, continue patient's Eliquis follow H&H.  Neurochecks.  Telemetry. Current home meds include amiodarone 200 mg, Eliquis, metoprolol.   >> Hypothyroidism: Patient is currently on Synthroid 125 mcg will obtain a free T4 and TSH and follow-up.  >> Essential hypertension: See blood  pressure trends as follows. Vitals:   05/23/23 0215 05/23/23 0515 05/23/23 0715 05/23/23 0730  BP: (!) 147/63 (!) 159/66 (!) 154/64 (!) 170/72   05/23/23 0745 05/23/23 0800 05/23/23 0815 05/23/23 0830  BP: (!) 160/60 (!) 164/61 (!) 151/62 (!) 127/109   05/23/23 0845 05/23/23 0900 05/23/23 1115 05/23/23 1345  BP: (!) 160/77 (!) 169/67 (!) 170/93 (!) 141/63  Will resume patient's metoprolol, as needed hydralazine and hold amlodipine.   >> CAD status post DES: Continue patient on Eliquis, metoprolol, no allergy listed to statin, patient is currently on Zetia only.  Will defer to cardiology for consideration of statin or other options.   >> AKI on CKD stage IIIa: Lab Results  Component Value Date   CREATININE 2.00 (H) 05/23/2023   CREATININE 1.76 (H) 05/23/2023   CREATININE 1.27 (H) 07/09/2022  Currently we will hold patient's Lasix.  Since coming to the hospital patient is received 1 L normal saline bolus.  Intake/Output Summary (Last 24 hours) at 05/23/2023 1714 Last data filed at 05/23/2023 0754 Gross per 24 hour  Intake 1200 ml  Output --  Net 1200 ml    DVT prophylaxis:  Eliquis Consults:  None Advance Care Planning:    Code Status: Full Code   Family Communication:  None Disposition Plan:  Home Severity of Illness: The appropriate patient status for this patient is OBSERVATION. Observation status is judged to be reasonable and necessary in order to provide the required intensity of service to ensure the patient's safety. The patient's presenting symptoms, physical exam findings, and initial radiographic and laboratory data in the context of their medical condition is felt to place them at decreased risk for further clinical deterioration. Furthermore, it is anticipated that the patient will be medically stable for discharge from the hospital within 2 midnights of admission.   Author: Gertha Calkin, MD 05/23/2023 5:14 PM  For on call review [email or web address].   Unresulted  Labs (From admission, onward)     Start     Ordered   05/23/23 1713  Acid Fast Smear (AFB)  (AFB smear + Culture w reflexed sensitivities panel)  Once,   R       Placed in "And" Linked Group   05/23/23 1712   05/23/23 1713  Acid Fast Culture with reflexed sensitivities  (AFB smear + Culture w reflexed sensitivities panel)  Once,   R       Placed in "And" Linked Group   05/23/23 1712   05/23/23 1207  Brain natriuretic peptide  Once,   R        05/23/23 1206   05/23/23 0006  Ethanol  (CHL  ED TRAUMA PANEL [facility])  Once,   URGENT        05/23/23 0006            Orders Placed This Encounter  Procedures   Acid Fast Smear (AFB)   Acid Fast Culture with reflexed sensitivities   DG Chest Port 1 View   CT HEAD WO CONTRAST   CT CERVICAL SPINE WO CONTRAST   CT CHEST WO CONTRAST   Comprehensive metabolic panel   CBC   Ethanol   Urinalysis, Routine w reflex microscopic -Urine, Clean Catch   Protime-INR   Brain natriuretic peptide   Diet Heart Room service appropriate? Yes; Fluid consistency: Thin   ED Cardiac monitoring   Measure blood pressure   Initiate Carrier Fluid Protocol   Maintain IV access   Vital signs   Orthostatic vital signs on admission   Notify physician (specify)   Refer to Sidebar Report Refer to ICU, Med-Surg, Progressive, and Step-Down Mobility Protocol Sidebars   Daily weights   If patient diabetic or glucose greater than 140 notify physician for Sliding Scale Insulin Orders   Intake and output   Neuro checks   Apply Syncope Care Plan   Initiate Oral Care Protocol   Bed rest   Swallow screen   Cardiac Monitoring - Continuous Indefinite   Full code   Consult to hospitalist   Inpatient consult to Cardiology Consult Timeframe: ROUTINE - requires response within 24 hours; Reason for Consult? fall at home suspect maybe syncope but also has pacemaker am evalauting him for cva but also need device check and cards , ? if pt ...   monitoring by pharmacy   Consult  to pulmonology Consult Timeframe: ROUTINE - requires response within 24 hours; Reason for Consult? MAC   Droplet precaution   Pulse oximetry with vital signs   I-Stat Chem 8, ED   EKG 12-Lead   EKG 12-Lead   Place in observation (patient's expected length of stay will be less than 2 midnights)   Fall precautions   Aspiration precautions   'ct chest shows MAC infection. We will place negative pressure room and isolation and get pulmonology consult.

## 2023-05-23 NOTE — ED Notes (Signed)
 Trauma Response Nurse Documentation   [name] [name] is a 89 y.o. female arriving to [facility] ED via [facility]  On Eliquis (apixaban) daily. Trauma was activated as a Level 2 by Lester Kinsman RN based on the following trauma criteria Elderly patients > 65 with head trauma on anti-coagulation (excluding ASA).  Patient cleared for CT by Dr. Madilyn Hook. Pt transported to CT with trauma response nurse present to monitor. RN remained with the patient throughout their absence from the department for clinical observation.   GCS 15.  Trauma MD Arrival Time: .  History   Past Medical History:  Diagnosis Date   CKD (chronic kidney disease)    Complication of anesthesia    Coronary artery disease    a. 12/20/2016: s/p DES to LAD    Diverticulosis    Family history of adverse reaction to anesthesia    sister also has post-op n/v   Family history of breast cancer 09/01/2020   GERD (gastroesophageal reflux disease)    Hemorrhoids    Hyperlipidemia    Hypertension    Hypothyroidism    Osteoarthritis    Paroxysmal atrial fibrillation (HCC)    a. on OAC with Xarelto but switched to Eliquis after stenting.    Personal history of skin cancer 09/01/2020   PONV (postoperative nausea and vomiting)    Presence of permanent cardiac pacemaker    S/P TAVR (transcatheter aortic valve replacement) 01/29/2017   23 mm Edwards Sapien 3 transcatheter heart valve placed via percutaneous right transfemoral approach      Past Surgical History:  Procedure Laterality Date   ABDOMINAL HYSTERECTOMY  1980   BASAL CELL CARCINOMA EXCISION     BREAST LUMPECTOMY WITH RADIOACTIVE SEED LOCALIZATION Left 10/04/2020   Procedure: LEFT BREAST LUMPECTOMY WITH RADIOACTIVE SEED LOCALIZATION;  Surgeon: Griselda Miner, MD;  Location: [facility] OR;  Service: General;  Laterality: Left;   CARDIAC CATHETERIZATION  ~ 11/2016   CATARACT EXTRACTION W/ INTRAOCULAR LENS  IMPLANT, BILATERAL Bilateral    CORONARY ANGIOPLASTY WITH STENT  PLACEMENT  12/20/2016   "1 stent"   CORONARY STENT INTERVENTION N/A 12/20/2016   Procedure: CORONARY STENT INTERVENTION;  Surgeon: Swaziland, Peter M, MD;  Location: [facility] INVASIVE CV LAB;  Service: Cardiovascular;  Laterality: N/A;   FINGER SURGERY Left    "fell; developed skiers thumb; had to operate on it"   INSERT / REPLACE / REMOVE PACEMAKER  1993   for syptomatic bradycardia and syncope -- in [facility]   INTRAMEDULLARY (IM) NAIL INTERTROCHANTERIC Left 10/19/2018   Procedure: INTRAMEDULLARY (IM) NAIL INTERTROCHANTRIC;  Surgeon: Myrene Galas, MD;  Location: [facility] OR;  Service: Orthopedics;  Laterality: Left;   LEAD REVISION/REPAIR N/A 10/09/2016   New left subclavian MDT Adapta L PPM dual chamber system implanted by Dr Johney Frame with previously placed R subclavian system abandoned   PACEMAKER GENERATOR CHANGE  2001   pulse generator replacement by Dr. March Rummage GENERATOR CHANGE  04/01/2008   PPM Medtronic -- model # ADDRL1 serial # [id number] -- pulse generator replacement by Dr. Reyes Ivan    RIGHT/LEFT HEART CATH AND CORONARY ANGIOGRAPHY N/A 11/27/2016   Procedure: RIGHT/LEFT HEART CATH AND CORONARY ANGIOGRAPHY;  Surgeon: Swaziland, Peter M, MD;  [facility] INVASIVE CV LAB;  Service: Cardiovascular;  Laterality: N/A;   SQUAMOUS CELL CARCINOMA EXCISION     TEE WITHOUT CARDIOVERSION N/A 01/29/2017   Procedure: TRANSESOPHAGEAL ECHOCARDIOGRAM (TEE);  Surgeon: Tonny Bollman, MD;  Location: [facility] OR;  Service: Open Heart Surgery;  Laterality: N/A;   TONSILLECTOMY     TRANSCATHETER AORTIC VALVE REPLACEMENT, TRANSFEMORAL N/A 01/29/2017   Procedure: TRANSCATHETER AORTIC VALVE REPLACEMENT, TRANSFEMORAL;  Surgeon: Tonny Bollman, MD;  Location: [facility] OR;  Service: Open Heart Surgery;  Laterality: N/A;       Initial Focused Assessment (If applicable, or please see trauma documentation): Airway-- intact, no visible obstruction Breathing-- spontaneous, unlabored Circulation-- no obvious bleeding  noted  CT's Completed:   CT Head and CT C-Spine   Interventions:  See event summry  Plan for disposition:  Admission to floor (Medical admit)  Consults completed:  none at 0443.  Event Summary: Patient brought in by [facility], patient with fall this evening, striking her head. Patient arrives alert and oriented x4, GCS 15. Manual BP obtained. Trauma labs obtained. Xray chest completed. Patient to CT with primary RN.  MTP Summary (If applicable):  N/A  Bedside handoff with ED RN Marcello Moores.    [name]  Trauma Response RN  Please call TRN at [phone] for further assistance.

## 2023-05-24 DIAGNOSIS — Y92009 Unspecified place in unspecified non-institutional (private) residence as the place of occurrence of the external cause: Secondary | ICD-10-CM | POA: Diagnosis not present

## 2023-05-24 DIAGNOSIS — J219 Acute bronchiolitis, unspecified: Secondary | ICD-10-CM | POA: Diagnosis not present

## 2023-05-24 DIAGNOSIS — W19XXXA Unspecified fall, initial encounter: Secondary | ICD-10-CM | POA: Diagnosis not present

## 2023-05-24 DIAGNOSIS — J189 Pneumonia, unspecified organism: Secondary | ICD-10-CM | POA: Diagnosis not present

## 2023-05-24 LAB — BASIC METABOLIC PANEL
Anion gap: 13 (ref 5–15)
BUN: 16 mg/dL (ref 8–23)
CO2: 21 mmol/L — ABNORMAL LOW (ref 22–32)
Calcium: 9.3 mg/dL (ref 8.9–10.3)
Chloride: 101 mmol/L (ref 98–111)
Creatinine, Ser: 1.22 mg/dL — ABNORMAL HIGH (ref 0.44–1.00)
GFR, Estimated: 42 mL/min — ABNORMAL LOW (ref >60)
Glucose, Bld: 126 mg/dL — ABNORMAL HIGH (ref 70–99)
Potassium: 4.6 mmol/L (ref 3.5–5.1)
Sodium: 135 mmol/L (ref 135–145)

## 2023-05-24 LAB — RESPIRATORY PANEL BY PCR

## 2023-05-24 LAB — CBC
HCT: 41.9 % (ref 36.0–46.0)
Hemoglobin: 14.2 g/dL (ref 12.0–15.0)
MCH: 33.9 pg (ref 26.0–34.0)
MCHC: 33.9 g/dL (ref 30.0–36.0)
MCV: 100 fL (ref 80.0–100.0)
Platelets: 153 10*3/uL (ref 150–400)
RBC: 4.19 MIL/uL (ref 3.87–5.11)
RDW: 14.2 % (ref 11.5–15.5)
WBC: 6.8 10*3/uL (ref 4.0–10.5)
nRBC: 0 % (ref 0.0–0.2)

## 2023-05-24 LAB — EXPECTORATED SPUTUM ASSESSMENT W GRAM STAIN, RFLX TO RESP C

## 2023-05-24 LAB — MAGNESIUM: Magnesium: 1.9 mg/dL (ref 1.7–2.4)

## 2023-05-24 MED ORDER — SODIUM CHLORIDE 0.9 % IV SOLN
INTRAVENOUS | Status: DC
Start: 2023-05-24 — End: 2023-05-25

## 2023-05-24 MED ORDER — PREDNISONE 20 MG PO TABS
20.0000 mg | ORAL_TABLET | Freq: Every day | ORAL | Status: DC
Start: 2023-05-25 — End: 2023-05-28
  Administered 2023-05-25 – 2023-05-28 (×4): 20 mg via ORAL
  Filled 2023-05-24 (×4): qty 1

## 2023-05-24 MED ORDER — ALBUTEROL SULFATE (2.5 MG/3ML) 0.083% IN NEBU
2.5000 mg | INHALATION_SOLUTION | Freq: Three times a day (TID) | RESPIRATORY_TRACT | Status: DC
  Administered 2023-05-25: 2.5 mg via RESPIRATORY_TRACT
  Filled 2023-05-24: qty 3

## 2023-05-24 NOTE — TOC Initial Note (Signed)
 Transition of Care [facility]) - Initial/Assessment Note    Patient Details  Name: [name] [name] MRN: [id number] Date of Birth: 1929/12/08  [facility] CM/SW Contact:    Elberta Fortis, RN Phone Number: 05/24/2023, 9:21 AM  Clinical Narrative:  Met with patient in her ED room. She states she has a stable living environment and has a caregiver that helps her and drives her to appointments, etc. She's widowed, has no children, and has a brother and sister nearby. She uses a cane and has a chair lift to take her up the stairs.                  Expected Discharge Plan: Home w Home Health Services Barriers to Discharge: Continued Medical Work up   Patient Goals and CMS Choice Patient states their goals for this hospitalization and ongoing recovery are:: To go back home.          Expected Discharge Plan and Services       Living arrangements for the past 2 months: Single Family Home (Lives in a townhouse with stairs and has a chair lift for using the stairs.)                                      Prior Living Arrangements/Services Living arrangements for the past 2 months: Single Family Home (Lives in a townhouse with stairs and has a chair lift for using the stairs.) Lives with:: Self (Has a caregiver 2-4x/week, depending on her needs.) Patient language and need for interpreter reviewed:: Yes Do you feel safe going back to the place where you live?: Yes      Need for Family Participation in Patient Care: No (Comment) Care giver support system in place?: Yes (comment) Current home services: DME, Other (comment) (Uses a cane, has a private caregiver.) Criminal Activity/Legal Involvement Pertinent to Current Situation/Hospitalization: No - Comment as needed  Activities of Daily Living   ADL Screening (condition at time of admission) Independently performs ADLs?: Yes (appropriate for developmental age) (ambulates with cane) Is the patient deaf or have  difficulty hearing?: Yes Does the patient have difficulty seeing, even when wearing glasses/contacts?: Yes Does the patient have difficulty concentrating, remembering, or making decisions?: Yes  Permission Sought/Granted                  Emotional Assessment Appearance:: Appears stated age, Well-Groomed   Affect (typically observed): Accepting, Pleasant, Appropriate Orientation: : Oriented to Self, Oriented to Place, Oriented to  Time, Oriented to Situation Alcohol / Substance Use: Never Used Psych Involvement: No (comment)  Admission diagnosis:  Fall at home, initial encounter [W19.Lorne Skeens, Y92.009] Patient Active Problem List   Diagnosis Date Noted   Fall at home, initial encounter 05/23/2023   Hypercoagulable state due to persistent atrial fibrillation (HCC) 07/25/2022   Genetic testing 11/03/2020   Family history of breast cancer 09/01/2020   Personal history of skin cancer 09/01/2020   Malignant neoplasm of upper-outer quadrant of left breast in female, estrogen receptor positive (HCC) 08/26/2020   Hip fracture (HCC) 10/19/2018   Sick sinus syndrome (HCC)    Presence of permanent cardiac pacemaker    Paroxysmal atrial fibrillation (HCC)    Osteoarthritis    Hypothyroidism    Hypertension    Hyperlipidemia    Hemorrhoids    GERD (gastroesophageal reflux disease)    Diverticulosis    Coronary  artery disease    Complication of anesthesia    Aortic stenosis, severe    Pericarditis 01/31/2017   Acute on chronic diastolic heart failure (HCC) 01/30/2017   Acute blood loss as cause of postoperative anemia 01/30/2017   S/P TAVR (transcatheter aortic valve replacement) 01/29/2017   CKD (chronic kidney disease)    CAD in native artery 12/20/2016   Anticoagulated 05/31/2016   Pacemaker-Medtronic 01/01/2012   Long term current use of anticoagulant therapy 06/18/2011   Severe aortic stenosis 09/01/2010   BRADYCARDIA-TACHYCARDIA SYNDROME 03/31/2010   HLD (hyperlipidemia)  03/30/2010   Essential hypertension 03/30/2010   Osteoarthritis 03/30/2010   PCP:  Creola Corn, MD Pharmacy:   [facility] [phone] [place], [place] - [address] AT [facility] [address] [place] [address] Phone: [phone] Fax: [phone]     Social Drivers of Health (SDOH) Social History: SDOH Screenings   Food Insecurity: No Food Insecurity (05/23/2023)  Housing: Low Risk  (05/23/2023)  Transportation Needs: No Transportation Needs (05/23/2023)  Utilities: Not At Risk (05/23/2023)  Alcohol Screen: Low Risk  (01/16/2021)  Depression (PHQ2-9): Low Risk  (01/16/2021)  Financial Resource Strain: Low Risk  (01/16/2021)  Physical Activity: Insufficiently Active (01/16/2021)  Social Connections: Socially Isolated (05/23/2023)  Stress: No Stress Concern Present (01/16/2021)  Tobacco Use: Medium Risk (05/23/2023)   SDOH Interventions:     Readmission Risk Interventions     No data to display

## 2023-05-24 NOTE — Progress Notes (Signed)
 NAME:  [name] [name], MRN:  [id number], DOB:  [birth year], LOS: 0 ADMISSION DATE:  05/23/2023, CONSULTATION DATE:  3/20/225 REFERRING MD:  Dr. Allena Katz - TRH, CHIEF COMPLAINT:  Abnormla CT    History of Present Illness:  [name] [name] is a 89 year old female with a past medical history significant for CKD, CAD s/p DES to LAD, HTN, HLD, osteoarthritis, paroxysmal A-fib on anticoagulation at baseline, s/p TAVR, and anemia who presented to the ED 3/20 via EMS after a mechanical fall with head trauma on anticoagulation.  Patient reports on ambulation to bathroom she became dizzy and fell hitting her head.  Patient reports she has been self-medicating for URI symptoms with Tessalon Perles and over-the-counter cold medications which she attributes to cause of dizziness.  She was also treated with bactrim as an outpatient by PCP. Patient denies loss of consciousness or vision changes post fall.  On ED arrival patient was seen hemodynamically stable with only mild hypertension.  Lab work significant for sodium 129, potassium 5.2, chloride 95, creatinine 1.75, INR within normal at 1.2.  Head and cervical spine CT negative for any acute abnormalities.  CT chest with no acute traumatic injuries but concerning for possible underlying MAC for which PCCM was consulted  Pertinent  Medical History  CKD, CAD s/p DES to LAD, HTN, HLD, osteoarthritis, paroxysmal A-fib on anticoagulation at baseline, s/p TAVR, and anemia  Significant Hospital Events: Including procedures, antibiotic start and stop dates in addition to other pertinent events   3/20 presented after mechanical fall with concern of head trauma on anticoagulation.  No traumatic injuries seen on CT.  Patient was admitted for mild AKI.  PCCM consulted for abnormal chest CT  Interim History / Subjective:   Patient reports her breathing is slightly better today.   Objective   Blood pressure 134/63, pulse 74, temperature 98 F (36.7 C), temperature source  Oral, resp. rate 19, height 5\' 5"  (1.651 m), weight 48.1 kg, SpO2 99%.        Intake/Output Summary (Last 24 hours) at 05/24/2023 1653 Last data filed at 05/24/2023 1606 Gross per 24 hour  Intake 337 ml  Output --  Net 337 ml   Filed Weights   05/23/23 0012  Weight: 48.1 kg    Examination: General: Elderly woman, sitting up on side of bed HEENT: [place]/AT, MM pink/moist, PERRL,  Neuro: normal speech, moves all 4 extremities CV: s1s2 regular rate and rhythm, no murmur, rubs, or gallops,  PULM:  on room air, no wheezing, severe kyphosis GI: scaphoid, soft Extremities: warm/dry, no edema, decreased muscle bulk and tone Skin: thin skin   Respiratory Viral Panel  is +RSV   Resolved Hospital Problem list     Assessment & Plan:  RSV bronchiolitis Abnormal CT Chest concerning for MAI infection Bronchiectasis, lower lobe predominant. AKI and hyperkalemia in the setting of bactrim use Dehydration (hyponatremia with clinical hypovolemia)  P: Check sputum culture - expectorated for afb and standard gramstain/culture TID airway clearance with albuterol, nebulized saline, followed by chest PT.  Mobilize as able with PT/OT Start prednisone 20mg  daily for 5 days  PCCM will follow   Labs   CBC: Recent Labs  Lab 05/23/23 0006 05/23/23 0027 05/24/23 1337  WBC 5.3  --  6.8  HGB 14.1 14.3 14.2  HCT 41.5 42.0 41.9  MCV 102.0*  --  100.0  PLT 154  --  153    Basic Metabolic Panel: Recent Labs  Lab 05/23/23 0006 05/23/23 0027 05/24/23 1337  NA 129* 127* 135  K 5.2* 5.2* 4.6  CL 95* 98 101  CO2 22  --  21*  GLUCOSE 97 91 126*  BUN 34* 33* 16  CREATININE 1.76* 2.00* 1.22*  CALCIUM 9.8  --  9.3  MG  --   --  1.9   GFR: Estimated Creatinine Clearance: 22.8 mL/min (A) (by C-G formula based on SCr of 1.22 mg/dL (H)). Recent Labs  Lab 05/23/23 0006 05/24/23 1337  WBC 5.3 6.8    Liver Function Tests: Recent Labs  Lab 05/23/23 0006  AST 31  ALT 20  ALKPHOS 65   BILITOT 0.9  PROT 6.3*  ALBUMIN 3.8   No results for input(s): "LIPASE", "AMYLASE" in the last 168 hours. No results for input(s): "AMMONIA" in the last 168 hours.  ABG    Component Value Date/Time   PHART 7.432 01/29/2017 0958   PCO2ART 40.6 01/29/2017 0958   PO2ART 223.0 (H) 01/29/2017 0958   HCO3 27.2 01/29/2017 0958   TCO2 24 05/23/2023 0027   O2SAT 100.0 01/29/2017 0958     Coagulation Profile: Recent Labs  Lab 05/23/23 0006  INR 1.2    Cardiac Enzymes: No results for input(s): "CKTOTAL", "CKMB", "CKMBINDEX", "TROPONINI" in the last 168 hours.  HbA1C: Hgb A1c MFr Bld  Date/Time Value Ref Range Status  01/28/2017 09:00 AM 5.5 4.8 - 5.6 % Final    Comment:    (NOTE)         Prediabetes: 5.7 - 6.4         Diabetes: >6.4         Glycemic control for adults with diabetes: <7.0     CBG: No results for input(s): "GLUCAP" in the last 168 hours.   Melody Comas, MD [facility] Office: [phone]   See Amion for personal pager PCCM on call pager [phone] until 7pm. Please call Elink 7p-7a. [phone]

## 2023-05-24 NOTE — Care Management Obs Status (Signed)
 MEDICARE OBSERVATION STATUS NOTIFICATION   Patient Details  Name: [name] [name] MRN: [id number] Date of Birth: [birth year]   Medicare Observation Status Notification Given:  Yes    Kizzie Ide Vineeth Fell, RN 05/24/2023, 9:24 AM

## 2023-05-24 NOTE — Progress Notes (Signed)
 PROGRESS NOTE  [name] [name] WGN:[id number] DOB: 1929/11/16 DOA: 05/23/2023 PCP: Creola Corn, MD   LOS: 0 days   Brief narrative:  [name] [name] is a 89 y.o. female with past medical history of paroxysmal atrial fibrillation tachybradycardia syndrome status post pacemaker implant, CKD stage IIIa, GERD, hypothyroidism, CAD status post stent, aortic valve replacement is status post TAVR on January 29, 2017, essential hypertension, chronic anticoagulation with Eliquis presented to hospital after fall.  Patient stated that she was lightheaded with some double vision when that happened.  In the ED patient stable.  BMP showed hyponatremia with sodium of 129 with potassium at 5.2.  Creatinine elevated at 1.7.  Urinalysis was negative.  EKG showed normal sinus rhythm. Xray chest negative for any acute cardiopulmonary disease.  CT head scan was negative for acute findings as well including CT C-spine.  Patient was then considered for admission to hospital for further evaluation and treatment.    Assessment/Plan: Principal Problem:   Fall at home, initial encounter Active Problems:   Essential hypertension   CAD in native artery   S/P TAVR (transcatheter aortic valve replacement)   Paroxysmal atrial fibrillation (HCC)   Hypothyroidism   Hypertension   Fall at home: Patient is frail and elderly.  Likely causes include volume depletion.  Recent upper respiratory tract infection.  Patient stated that she was dizzy prior to the fall.  Continue fall precautions.  Get PT OT evaluation.  Hold off with diuretic.  States that she does have home health at home and lives by herself.  At baseline patient uses a walker for ambulation.  Check orthostatic blood pressure.  Will continue to monitor in telemetry.  Volume depletion.  Continue hydration.  Paroxysmal atrial fibrillation: Status post TAVR. Continue Eliquis amiodarone and metoprolol.  Rate controlled at this time.   Hypothyroidism. Continue  Synthroid.   Essential hypertension: Patient is on amlodipine, metoprolol at home.  Will continue to hold for now.  Check orthostatic blood pressure.  Hold off with diuretics for now.  Possible community-acquired pneumonia.  Respiratory syncytial virus infection.   Failed outpatient treatment.  Abnormal CT scan concerning for MAI infection with bronchiectasis.  Seen by PCCM.  PCCM to follow.  Sputum culture showing gram-positive cocci.  AFB smear and culture pending.  No leukocytosis on presentation.  Respiratory viral panel showing RSV.  Acute kidney injury with hyperkalemia.  Patient was on Bactrim as outpatient.  Hold off with Bactrim.  Continue hydration.  Creatinine on presentation at 2.0.  Will check BMP again today.  Monitor closely.  Baseline creatinine between 1.2-1.4.   DVT prophylaxis: apixaban (ELIQUIS) tablet 2.5 mg Start: 05/23/23 1545 apixaban (ELIQUIS) tablet 2.5 mg   Disposition: Uncertain at this time.  Will get PT OT evaluation.  Status is: Observation  The patient will require care spanning > 2 midnights and should be moved to inpatient because: Mechanical fall, debility, volume depletion, acute kidney injury, pending clinical improvement,    Code Status:     Code Status: Limited: Do not attempt resuscitation (DNR) -DNR-LIMITED -Do Not Intubate/DNI   Family Communication: None at bedside.  Consultants: PCCM  Procedures: None  Anti-infectives:  None at present.  Anti-infectives (From admission, onward)    None      Subjective: Today, patient was seen and examined at bedside.  Complains of cough congestion.  Denies overt pain but some abrasions noted over the elbow on the right.  Objective: Vitals:   05/24/23 1245 05/24/23 1257  BP: Marland Kitchen)  145/77   Pulse:    Resp: 20 16  Temp:    SpO2:  100%   No intake or output data in the 24 hours ending 05/24/23 1330 Filed Weights   05/23/23 0012  Weight: 48.1 kg   Body mass index is 17.64 kg/m.    Physical Exam:  GENERAL: Patient is alert awake and oriented.  Elderly female, not in obvious distress, Communicative, appears chronically ill and deconditioned, HENT: No scleral pallor or icterus. Pupils equally reactive to light. Oral mucosa is slightly dry. NECK: is supple, no gross swelling noted. CHEST: Clear to auscultation. No crackles or wheezes.  Diminished breath sounds bilaterally.  Kyphosis noted.  Mild wheezing. CVS: S1 and S2 heard, no murmur. Regular rate and rhythm.  ABDOMEN: Soft, non-tender, bowel sounds are present. EXTREMITIES: No edema. CNS: Cranial nerves are intact. No focal motor deficits. SKIN: warm and dry without rashes.  Right elbow with abrasions, bruises/discoloration over the bilateral lower extremity.  Data Review: I have personally reviewed the following laboratory data and studies,  CBC: Recent Labs  Lab 05/23/23 0006 05/23/23 0027  WBC 5.3  --   HGB 14.1 14.3  HCT 41.5 42.0  MCV 102.0*  --   PLT 154  --    Basic Metabolic Panel: Recent Labs  Lab 05/23/23 0006 05/23/23 0027  NA 129* 127*  K 5.2* 5.2*  CL 95* 98  CO2 22  --   GLUCOSE 97 91  BUN 34* 33*  CREATININE 1.76* 2.00*  CALCIUM 9.8  --    Liver Function Tests: Recent Labs  Lab 05/23/23 0006  AST 31  ALT 20  ALKPHOS 65  BILITOT 0.9  PROT 6.3*  ALBUMIN 3.8   No results for input(s): "LIPASE", "AMYLASE" in the last 168 hours. No results for input(s): "AMMONIA" in the last 168 hours. Cardiac Enzymes: No results for input(s): "CKTOTAL", "CKMB", "CKMBINDEX", "TROPONINI" in the last 168 hours. BNP (last 3 results) Recent Labs    05/23/23 1804  BNP 445.1*    ProBNP (last 3 results) No results for input(s): "PROBNP" in the last 8760 hours.  CBG: No results for input(s): "GLUCAP" in the last 168 hours. Recent Results (from the past 240 hours)  Expectorated Sputum Assessment w Gram Stain, Rflx to Resp Cult     Status: None   Collection Time: 05/23/23  6:28 PM    Specimen: Expectorated Sputum  Result Value Ref Range Status   Specimen Description EXPECTORATED SPUTUM  Final   Special Requests NONE  Final   Sputum evaluation   Final    THIS SPECIMEN IS ACCEPTABLE FOR SPUTUM CULTURE Performed at [facility], [address], [place], [place] [address]    Report Status 05/24/2023 FINAL  Final  Culture, Respiratory w Gram Stain     Status: None (Preliminary result)   Collection Time: 05/23/23  6:28 PM  Result Value Ref Range Status   Specimen Description EXPECTORATED SPUTUM  Final   Special Requests NONE Reflexed from [id number]  Final   Gram Stain   Final    FEW SQUAMOUS EPITHELIAL CELLS PRESENT WBC PRESENT, PREDOMINANTLY PMN ABUNDANT GRAM POSITIVE COCCI RARE GRAM NEGATIVE RODS Performed at [facility], [address] [place], [place] [address]    Culture PENDING  Incomplete   Report Status PENDING  Incomplete  Respiratory (~20 pathogens) panel by PCR     Status: Abnormal   Collection Time: 05/23/23  6:31 PM   Specimen: Nasopharyngeal Swab; Respiratory  Result Value Ref Range Status   Adenovirus NOT DETECTED NOT DETECTED Final   Coronavirus 229E NOT DETECTED NOT DETECTED Final    Comment: (NOTE) The Coronavirus on the Respiratory Panel, DOES NOT test for the novel  Coronavirus (2019 nCoV)    Coronavirus HKU1 NOT DETECTED NOT DETECTED Final   Coronavirus NL63 NOT DETECTED NOT DETECTED Final   Coronavirus OC43 NOT DETECTED NOT DETECTED Final   Metapneumovirus NOT DETECTED NOT DETECTED Final   Rhinovirus / Enterovirus NOT DETECTED NOT DETECTED Final   Influenza A NOT DETECTED NOT DETECTED Final   Influenza B NOT DETECTED NOT DETECTED Final   Parainfluenza Virus 1 NOT DETECTED NOT DETECTED Final   Parainfluenza Virus 2 NOT DETECTED NOT DETECTED Final   Parainfluenza Virus 3 NOT DETECTED NOT DETECTED Final   Parainfluenza Virus 4 NOT DETECTED NOT DETECTED Final   Respiratory Syncytial Virus DETECTED (A) NOT DETECTED Final    Bordetella pertussis NOT DETECTED NOT DETECTED Final   Bordetella Parapertussis NOT DETECTED NOT DETECTED Final   Chlamydophila pneumoniae NOT DETECTED NOT DETECTED Final   Mycoplasma pneumoniae NOT DETECTED NOT DETECTED Final    Comment: Performed at [facility], [address] [place], [place] [address]     Studies: CT CHEST WO CONTRAST Result Date: 05/23/2023 CLINICAL DATA:  rales.  Fall.  Cough. EXAM: CT CHEST WITHOUT CONTRAST TECHNIQUE: Multidetector CT imaging of the chest was performed following the standard protocol without IV contrast. RADIATION DOSE REDUCTION: This exam was performed according to the departmental dose-optimization program which includes automated exposure control, adjustment of the mA and/or kV according to patient size and/or use of iterative reconstruction technique. COMPARISON:  CT angiography chest from 12/31/2016. FINDINGS: Cardiovascular: Normal cardiac size. No pericardial effusion. No aortic aneurysm. There are coronary artery calcifications, in keeping with coronary artery disease. There are also severe peripheral atherosclerotic vascular calcifications of thoracic aorta and its major branches. Prosthetic aortic valve noted. Cardiac pacemaker leads noted. Mediastinum/Nodes: Visualized thyroid gland appears grossly unremarkable. No solid / cystic mediastinal masses. The esophagus is nondistended precluding optimal assessment. No mediastinal or axillary lymphadenopathy by size criteria. Evaluation of bilateral hila is limited due to lack on intravenous contrast: however, no large hilar lymphadenopathy identified. Lungs/Pleura: The central tracheo-bronchial tree is patent. There are scattered, patchy small groupings of focal bronchiectasis predominantly in the periphery, with associated focal bronchial wall thickening. There also small centrilobular tree-in-bud configuration nodules in these areas. There is involvement of middle lobe, anterior segment of right upper  lobe, inferior lingula and anterior segment of left upper lobe. In appropriate clinical settings findings favor pulmonary mycobacterium avium complex infection. No mass, consolidation, pleural effusion or pneumothorax. Upper Abdomen: Visualized upper abdominal viscera within normal limits. Musculoskeletal: The visualized soft tissues of the chest wall are grossly unremarkable. No suspicious osseous lesions. There are mild multilevel degenerative changes in the visualized spine. No acute rib fracture of vertebral compression fracture. Redemonstration of chronic Schmorl's node in the superior endplate of L1 vertebra, unchanged since the prior study from 2018. IMPRESSION: 1. No acute traumatic injury to the chest. 2. There are scattered, patchy small groupings of focal bronchiectasis predominantly in the periphery, with associated focal bronchial wall thickening. There are also small centrilobular tree-in-bud configuration nodules in these areas. There is involvement of middle lobe, inferior lingula and anterior segments of bilateral upper lobes. 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 3. Multiple other nonacute observations, as described above. Aortic  Atherosclerosis (ICD10-I70.0). Electronically Signed   By: Jules Schick M.D.   On: 05/23/2023 15:21   DG Chest Port 1 View Result Date: 05/23/2023 CLINICAL DATA:  Level 2 trauma, fall, cough EXAM: PORTABLE CHEST 1 VIEW COMPARISON:  07/09/2022 FINDINGS: Stable cardiomediastinal silhouette. Coronary stenting. TAVR. Left chest wall pacemaker. Disconnected right chest wall pacer aids. Aortic atherosclerotic calcification. Hyperinflation and chronic bronchitic changes. Chronic blunting of the costophrenic angles. No focal consolidation or pneumothorax. No displaced rib fractures. IMPRESSION: No acute cardiopulmonary disease. COPD. Electronically Signed   By: Minerva Fester M.D.   On:  05/23/2023 00:44   CT HEAD WO CONTRAST Result Date: 05/23/2023 CLINICAL DATA:  Moderate-severe head trauma.  Blunt polytrauma. EXAM: CT HEAD WITHOUT CONTRAST CT CERVICAL SPINE WITHOUT CONTRAST TECHNIQUE: Multidetector CT imaging of the head and cervical spine was performed following the standard protocol without intravenous contrast. Multiplanar CT image reconstructions of the cervical spine were also generated. RADIATION DOSE REDUCTION: This exam was performed according to the departmental dose-optimization program which includes automated exposure control, adjustment of the mA and/or kV according to patient size and/or use of iterative reconstruction technique. COMPARISON:  CT head 09/12/2022 and cervical spine CT 12/17/2020 FINDINGS: CT HEAD FINDINGS Brain: No intracranial hemorrhage, mass effect, or evidence of acute infarct. No hydrocephalus. No extra-axial fluid collection. Age related cerebral atrophy and chronic small vessel ischemic disease. Vascular: No hyperdense vessel. Intracranial arterial calcification. Skull: No fracture or focal lesion. Sinuses/Orbits: No acute finding. Other: None. CT CERVICAL SPINE FINDINGS Alignment: No evidence of traumatic malalignment. Skull base and vertebrae: No acute fracture. No primary bone lesion or focal pathologic process. Soft tissues and spinal canal: No prevertebral fluid or swelling. No visible canal hematoma. Disc levels: Multilevel spondylosis and facet arthropathy is not significantly changed from 12/17/2020. No severe spinal canal narrowing. Upper chest: No acute abnormality. Other: Carotid calcification. IMPRESSION: 1. No acute intracranial abnormality. 2. No acute fracture in the cervical spine. 3. Chronic sphenoid sinusitis. Electronically Signed   By: Minerva Fester M.D.   On: 05/23/2023 00:42   CT CERVICAL SPINE WO CONTRAST Result Date: 05/23/2023 CLINICAL DATA:  Moderate-severe head trauma.  Blunt polytrauma. EXAM: CT HEAD WITHOUT CONTRAST CT  CERVICAL SPINE WITHOUT CONTRAST TECHNIQUE: Multidetector CT imaging of the head and cervical spine was performed following the standard protocol without intravenous contrast. Multiplanar CT image reconstructions of the cervical spine were also generated. RADIATION DOSE REDUCTION: This exam was performed according to the departmental dose-optimization program which includes automated exposure control, adjustment of the mA and/or kV according to patient size and/or use of iterative reconstruction technique. COMPARISON:  CT head 09/12/2022 and cervical spine CT 12/17/2020 FINDINGS: CT HEAD FINDINGS Brain: No intracranial hemorrhage, mass effect, or evidence of acute infarct. No hydrocephalus. No extra-axial fluid collection. Age related cerebral atrophy and chronic small vessel ischemic disease. Vascular: No hyperdense vessel. Intracranial arterial calcification. Skull: No fracture or focal lesion. Sinuses/Orbits: No acute finding. Other: None. CT CERVICAL SPINE FINDINGS Alignment: No evidence of traumatic malalignment. Skull base and vertebrae: No acute fracture. No primary bone lesion or focal pathologic process. Soft tissues and spinal canal: No prevertebral fluid or swelling. No visible canal hematoma. Disc levels: Multilevel spondylosis and facet arthropathy is not significantly changed from 12/17/2020. No severe spinal canal narrowing. Upper chest: No acute abnormality. Other: Carotid calcification. IMPRESSION: 1. No acute intracranial abnormality. 2. No acute fracture in the cervical spine. 3. Chronic sphenoid sinusitis. Electronically Signed   By: Angelique Holm.D.  On: 05/23/2023 00:42      Joycelyn Das, MD  [facility] 05/24/2023  If 7PM-7AM, please contact night-coverage

## 2023-05-24 NOTE — Evaluation (Signed)
 Occupational Therapy Evaluation Patient Details Name: [name] [name] MRN: [id number] DOB: Sep 22, 1929 Today's Date: 05/24/2023   History of Present Illness   89 y.o. female adm 3/20 with past medical history of paroxysmal atrial fibrillation tachybradycardia syndrome status post pacemaker implant, CKD stage IIIa, GERD, hypothyroidism, CAD status post stent, aortic valve replacement is status post TAVR on January 29, 2017, essential hypertension, chronic anticoagulation with Eliquis presented to hospital after fall.     Clinical Impressions Patient admitted for the diagnosis above.  PTA she lives alone, has a close friend as a CG for community mobility support, and family available PRN to assist.  She is very unsteady with the [facility], and remains a very high fall risk.  OT will continue efforts in the acute setting, and Patient will benefit from continued inpatient follow up therapy, <3 hours/day.  Patient would need 24 hour up to Mod A to return home directly.    BP supine: 115/6 Sit: 129/79 Stand: 130/61     If plan is discharge home, recommend the following:   A lot of help with bathing/dressing/bathroom;A lot of help with walking and/or transfers     Functional Status Assessment   Patient has had a recent decline in their functional status and demonstrates the ability to make significant improvements in function in a reasonable and predictable amount of time.     Equipment Recommendations   None recommended by OT     Recommendations for Other Services         Precautions/Restrictions   Precautions Precautions: Fall Restrictions Weight Bearing Restrictions Per Provider Order: No     Mobility Bed Mobility Overal bed mobility: Needs Assistance Bed Mobility: Sit to Supine       Sit to supine: Min assist        Transfers Overall transfer level: Needs assistance   Transfers: Sit to/from Stand, Bed to chair/wheelchair/BSC Sit to Stand: Min assist      Step pivot transfers: Min assist     General transfer comment: Very unsteady, doesn't like the RW, SPC is in the room.      Balance Overall balance assessment: Needs assistance Sitting-balance support: Feet supported Sitting balance-Leahy Scale: Good     Standing balance support: Single extremity supported, Bilateral upper extremity supported Standing balance-Leahy Scale: Poor                             ADL either performed or assessed with clinical judgement   ADL Overall ADL's : Needs assistance/impaired Eating/Feeding: Independent;Sitting   Grooming: Wash/dry hands;Contact guard assist;Standing   Upper Body Bathing: Set up;Sitting   Lower Body Bathing: Moderate assistance;Sit to/from stand   Upper Body Dressing : Minimal assistance;Sitting   Lower Body Dressing: Moderate assistance;Sit to/from stand   Toilet Transfer: Minimal assistance;Ambulation;Regular Toilet   Toileting- Clothing Manipulation and Hygiene: Supervision/safety;Sitting/lateral lean               Vision Patient Visual Report: No change from baseline       Perception Perception: Not tested       Praxis Praxis: Not tested       Pertinent Vitals/Pain Pain Assessment Pain Assessment: No/denies pain     Extremity/Trunk Assessment Upper Extremity Assessment Upper Extremity Assessment: Overall WFL for tasks assessed   Lower Extremity Assessment Lower Extremity Assessment: Defer to PT evaluation   Cervical / Trunk Assessment Cervical / Trunk Assessment: Kyphotic   Communication Communication Communication: No apparent difficulties  Cognition Arousal: Alert Behavior During Therapy: WFL for tasks assessed/performed Cognition: No apparent impairments                               Following commands: Intact       Cueing  General Comments   Cueing Techniques: Verbal cues   VSS on RA   Exercises     Shoulder Instructions      Home Living  Family/patient expects to be discharged to:: Private residence Living Arrangements: Alone Available Help at Discharge: Friend(s);Available PRN/intermittently Type of Home: House Home Access: Stairs to enter [employer] of Steps: 4 Entrance Stairs-Rails: Right;Left;Can reach both Home Layout: Two level;Bed/bath upstairs     Bathroom Shower/Tub: Producer, television/film/video: Handicapped height Bathroom Accessibility: Yes How Accessible: Accessible via walker Home Equipment: Rolling Walker (2 wheels);Cane - single point;Hand held shower head;Grab bars - tub/shower;Shower seat   Additional Comments: Herbalist      Prior Functioning/Environment Prior Level of Function : Needs assist             Mobility Comments: Has a RW, but chooses to use the [facility].  Baseline balance deficits, undergoing HH PT ADLs Comments: Patient reports Mod I with ADL, iADL and meal prep.  Does not drive.  CG for a few times a week for community mobility.    OT Problem List: Decreased strength;Decreased activity tolerance;Impaired balance (sitting and/or standing);Decreased safety awareness   OT Treatment/Interventions: Self-care/ADL training;Therapeutic activities;Patient/family education;Balance training;DME and/or AE instruction      OT Goals(Current goals can be found in the care plan section)   Acute Rehab OT Goals Patient Stated Goal: Return home OT Goal Formulation: With patient Time For Goal Achievement: 06/10/23 Potential to Achieve Goals: Good ADL Goals Pt Will Perform Grooming: with modified independence;standing Pt Will Perform Lower Body Dressing: with modified independence;sit to/from stand Pt Will Transfer to Toilet: with modified independence;ambulating;regular height toilet   OT Frequency:  Min 1X/week    Co-evaluation              AM-PAC OT "6 Clicks" Daily Activity     Outcome Measure Help from another person eating meals?: None Help from another person  taking care of personal grooming?: A Little Help from another person toileting, which includes using toliet, bedpan, or urinal?: A Little Help from another person bathing (including washing, rinsing, drying)?: A Lot Help from another person to put on and taking off regular upper body clothing?: A Little Help from another person to put on and taking off regular lower body clothing?: A Lot 6 Click Score: 17   End of Session Equipment Utilized During Treatment: Gait belt Nurse Communication: Mobility status  Activity Tolerance: Patient tolerated treatment well Patient left: in chair;with call bell/phone within reach;with chair alarm set  OT Visit Diagnosis: Unsteadiness on feet (R26.81);Muscle weakness (generalized) (M62.81)                Time: 6644-0347 OT Time Calculation (min): 23 min Charges:  OT General Charges $OT Visit: 1 Visit OT Evaluation $OT Eval Moderate Complexity: 1 Mod OT Treatments $Self Care/Home Management : 8-22 mins  05/24/2023  RP, OTR/L  Acute Rehabilitation Services  Office:  [phone]   Suzanna Obey 05/24/2023, 3:47 PM

## 2023-05-25 DIAGNOSIS — W19XXXA Unspecified fall, initial encounter: Secondary | ICD-10-CM | POA: Diagnosis not present

## 2023-05-25 DIAGNOSIS — J219 Acute bronchiolitis, unspecified: Secondary | ICD-10-CM | POA: Diagnosis not present

## 2023-05-25 DIAGNOSIS — Y92009 Unspecified place in unspecified non-institutional (private) residence as the place of occurrence of the external cause: Secondary | ICD-10-CM | POA: Diagnosis not present

## 2023-05-25 DIAGNOSIS — J189 Pneumonia, unspecified organism: Secondary | ICD-10-CM | POA: Diagnosis not present

## 2023-05-25 LAB — CBC
HCT: 41.3 % (ref 36.0–46.0)
Hemoglobin: 14.1 g/dL (ref 12.0–15.0)
MCH: 34.1 pg — ABNORMAL HIGH (ref 26.0–34.0)
MCHC: 34.1 g/dL (ref 30.0–36.0)
MCV: 99.8 fL (ref 80.0–100.0)
Platelets: 140 10*3/uL — ABNORMAL LOW (ref 150–400)
RBC: 4.14 MIL/uL (ref 3.87–5.11)
RDW: 14.3 % (ref 11.5–15.5)
WBC: 6 10*3/uL (ref 4.0–10.5)
nRBC: 0 % (ref 0.0–0.2)

## 2023-05-25 LAB — BASIC METABOLIC PANEL
Anion gap: 3 — ABNORMAL LOW (ref 5–15)
BUN: 14 mg/dL (ref 8–23)
CO2: 24 mmol/L (ref 22–32)
Calcium: 8.7 mg/dL — ABNORMAL LOW (ref 8.9–10.3)
Chloride: 106 mmol/L (ref 98–111)
Creatinine, Ser: 1.05 mg/dL — ABNORMAL HIGH (ref 0.44–1.00)
GFR, Estimated: 50 mL/min — ABNORMAL LOW (ref >60)
Glucose, Bld: 99 mg/dL (ref 70–99)
Potassium: 4.7 mmol/L (ref 3.5–5.1)
Sodium: 133 mmol/L — ABNORMAL LOW (ref 135–145)

## 2023-05-25 LAB — GLUCOSE, CAPILLARY
Glucose-Capillary: 95 mg/dL (ref 70–99)
Glucose-Capillary: 122 mg/dL — ABNORMAL HIGH (ref 70–99)

## 2023-05-25 LAB — MAGNESIUM: Magnesium: 1.9 mg/dL (ref 1.7–2.4)

## 2023-05-25 LAB — PHOSPHORUS: Phosphorus: 2.8 mg/dL (ref 2.5–4.6)

## 2023-05-25 MED ORDER — ALBUTEROL SULFATE (2.5 MG/3ML) 0.083% IN NEBU
2.5000 mg | INHALATION_SOLUTION | Freq: Two times a day (BID) | RESPIRATORY_TRACT | Status: DC
  Administered 2023-05-26 – 2023-05-27 (×3): 2.5 mg via RESPIRATORY_TRACT
  Filled 2023-05-25 (×3): qty 3

## 2023-05-25 MED ORDER — SODIUM CHLORIDE 3 % IN NEBU
4.0000 mL | INHALATION_SOLUTION | Freq: Two times a day (BID) | RESPIRATORY_TRACT | Status: AC
  Administered 2023-05-25 – 2023-05-26 (×2): 4 mL via RESPIRATORY_TRACT
  Filled 2023-05-25 (×2): qty 4

## 2023-05-25 NOTE — Progress Notes (Signed)
   NAME:  [name] [name], MRN:  [id number], DOB:  1929/04/22, LOS: 0 ADMISSION DATE:  05/23/2023, CONSULTATION DATE:  3/20/225 REFERRING MD:  Dr. Allena Katz - [facility], CHIEF COMPLAINT:  Abnormla CT    History of Present Illness:  [name] [name] is a 89 year old female with a past medical history significant for CKD, CAD s/p DES to LAD, HTN, HLD, osteoarthritis, paroxysmal A-fib on anticoagulation at baseline, s/p TAVR, and anemia who presented to the ED 3/20 via EMS after a mechanical fall with head trauma on anticoagulation.  Patient reports on ambulation to bathroom she became dizzy and fell hitting her head.  Patient reports she has been self-medicating for URI symptoms with Tessalon Perles and over-the-counter cold medications which she attributes to cause of dizziness.  She was also treated with bactrim as an outpatient by PCP. Patient denies loss of consciousness or vision changes post fall.  On ED arrival patient was seen hemodynamically stable with only mild hypertension.  Lab work significant for sodium 129, potassium 5.2, chloride 95, creatinine 1.75, INR within normal at 1.2.  Head and cervical spine CT negative for any acute abnormalities.  CT chest with no acute traumatic injuries but concerning for possible underlying MAC for which PCCM was consulted  Pertinent  Medical History  CKD, CAD s/p DES to LAD, HTN, HLD, osteoarthritis, paroxysmal A-fib on anticoagulation at baseline, s/p TAVR, and anemia  Significant Hospital Events: Including procedures, antibiotic start and stop dates in addition to other pertinent events   3/20 presented after mechanical fall with concern of head trauma on anticoagulation.  No traumatic injuries seen on CT.  Patient was admitted for mild AKI.  PCCM consulted for abnormal chest CT  Interim History / Subjective:  Feeling better today   Objective   Blood pressure 139/82, pulse 100, temperature 97.8 F (36.6 C), temperature source Oral, resp. rate 18, height 5\' 5"   (1.651 m), weight 50.5 kg, SpO2 94%.        Intake/Output Summary (Last 24 hours) at 05/25/2023 1438 Last data filed at 05/25/2023 0500 Gross per 24 hour  Intake 337 ml  Output 800 ml  Net -463 ml   Filed Weights   05/23/23 0012 05/25/23 0503  Weight: 48.1 kg 50.5 kg    Examination: General Pleasant 89 year old female patient she is lying in bed in no acute distress currently HEENT normocephalic atraumatic no jugular venous distention is appreciated Pulmonary some coarse scattered rhonchi no accessory use coarse rhonchus cough currently on room air speaking in full sentences Cardiac regular rate and rhythm Abdomen soft nontender Extremities warm dry brisk cap refill Neuro: Awake oriented  Resolved Hospital Problem list     Assessment & Plan:  RSV bronchiolitis Abnormal CT Chest concerning for MAI infection Bronchiectasis, lower lobe predominant. PAF AKI and hyperkalemia in the setting of bactrim use (improving) Dehydration (hyponatremia with clinical hypovolemia)resolved  Hypothyroidism  H/o HTN H/o CAD HLD  Pulm problem list  RSV bronchiolitis  Abnormal CT chest w/  Bronchiectasis, lower lobe predominant. plan F/u pending sputum culture as well as pending AFBs Cont TID airway clearance with albuterol, nebulized saline, followed by chest PT.  Mobilize as able with PT/OT Start prednisone 20mg  daily for 5 days (day 1 of 5)   We will see again on Monday

## 2023-05-25 NOTE — Plan of Care (Signed)

## 2023-05-25 NOTE — Progress Notes (Signed)
 PROGRESS NOTE  [name] [name] MVH:[id number] DOB: 12-05-1929 DOA: 05/23/2023 PCP: [name] Corn, MD   LOS: 0 days   Brief narrative:  [name] [name] is a 89 y.o. female with past medical history of paroxysmal atrial fibrillation tachybradycardia syndrome status post pacemaker implant, CKD stage IIIa, GERD, hypothyroidism, CAD status post stent, aortic valve replacement is status post TAVR on January 29, 2017, essential hypertension, chronic anticoagulation with Eliquis presented to hospital after fall.  Patient stated that she was lightheaded with some double vision when that happened.  In the ED, patient stable.  BMP showed hyponatremia with sodium of 129 with potassium at 5.2.  Creatinine elevated at 1.7.  Urinalysis was negative.  EKG showed normal sinus rhythm. Xray chest negative for any acute cardiopulmonary disease.  CT head scan was negative for acute findings as well including CT C-spine.  Patient was then considered for admission to hospital for further evaluation and treatment.   Assessment/Plan: Principal Problem:   Fall at home, initial encounter Active Problems:   Essential hypertension   CAD in native artery   S/P TAVR (transcatheter aortic valve replacement)   Paroxysmal atrial fibrillation (HCC)   Hypothyroidism   Hypertension   Fall at home: Patient is frail and elderly.  Likely secondary to volume depletion and recent infection..  Continue fall precautions.  PT OT recommends skilled nursing facility placement on discharge. Continue to hold off with diuretic.  At baseline uses a walker for ambulation.   Volume depletion.  Continue oral hydration.  Paroxysmal atrial fibrillation: Status post TAVR. Continue Eliquis amiodarone and metoprolol.  Rate controlled at this time.   Hypothyroidism. Continue Synthroid.   Essential hypertension: Patient is on amlodipine, metoprolol at home.  Holding at this time.  Blood pressure of 139/82  Possible community-acquired  pneumonia.  Respiratory syncytial virus infection.   Failed outpatient treatment.  Abnormal CT scan concerning for MAI infection with bronchiectasis.  Seen by PCCM.  PCCM to follow.  Sputum culture showing gram-positive cocci.  AFB smear and culture pending.  No leukocytosis on presentation.  Respiratory viral panel showing RSV.  Pulmonary following.  Acute kidney injury with hyperkalemia.  Patient was on Bactrim as outpatient.  Hold off with Bactrim.  Received IV hydration.  Will discontinue.  Creatinine on presentation at 2.0.  Creatinine has improved at this time.  Latest creatinine of 1.2..  Baseline creatinine between 1.2-1.4.   DVT prophylaxis: apixaban (ELIQUIS) tablet 2.5 mg Start: 05/23/23 1545 apixaban (ELIQUIS) tablet 2.5 mg   Disposition: Skilled nursing facility as per PT OT recommendation.  Status is: Observation  The patient will require care spanning > 2 midnights and should be moved to inpatient because: Mechanical fall, debility, volume depletion, acute kidney injury, pending clinical improvement, need for skilled nursing facility placement.    Code Status:     Code Status: Limited: Do not attempt resuscitation (DNR) -DNR-LIMITED -Do Not Intubate/DNI   Family Communication: None at bedside.  Consultants: PCCM  Procedures: None  Anti-infectives:  None at present.  Anti-infectives (From admission, onward)    None      Subjective: Today, patient was seen and examined at bedside.  Complains of cough with clear phlegm production.  Generalized weakness.   Objective: Vitals:   05/24/23 2349 05/25/23 0503  BP: 131/82 139/82  Pulse: 100   Resp: 20 18  Temp: 98.2 F (36.8 C) 97.8 F (36.6 C)  SpO2: 91% 94%    Intake/Output Summary (Last 24 hours) at 05/25/2023 1201 Last data  filed at 05/25/2023 0500 Gross per 24 hour  Intake 337 ml  Output 800 ml  Net -463 ml   Filed Weights   05/23/23 0012 05/25/23 0503  Weight: 48.1 kg 50.5 kg   Body mass index  is 18.53 kg/m.   Physical Exam:  GENERAL: Patient is alert awake and oriented.  Elderly female, not in obvious distress, Communicative, appears chronically ill and deconditioned, HENT: No scleral pallor or icterus. Pupils equally reactive to light. Oral mucosa is slightly dry. NECK: is supple, no gross swelling noted. CHEST: Diminished breath sounds bilaterally.  Kyphosis noted.  Coarse breath sounds noted. CVS: S1 and S2 heard, no murmur. Regular rate and rhythm.  ABDOMEN: Soft, non-tender, bowel sounds are present. EXTREMITIES: No edema. CNS: Cranial nerves are intact. No focal motor deficits. SKIN: warm and dry without rashes.  Right elbow with abrasions, bruises/discoloration over the bilateral lower extremity.  Data Review: I have personally reviewed the following laboratory data and studies,  CBC: Recent Labs  Lab 05/23/23 0006 05/23/23 0027 05/24/23 1337 05/25/23 0349  WBC 5.3  --  6.8 6.0  HGB 14.1 14.3 14.2 14.1  HCT 41.5 42.0 41.9 41.3  MCV 102.0*  --  100.0 99.8  PLT 154  --  153 140*   Basic Metabolic Panel: Recent Labs  Lab 05/23/23 0006 05/23/23 0027 05/24/23 1337 05/25/23 0349  NA 129* 127* 135 133*  K 5.2* 5.2* 4.6 4.7  CL 95* 98 101 106  CO2 22  --  21* 24  GLUCOSE 97 91 126* 99  BUN 34* 33* 16 14  CREATININE 1.76* 2.00* 1.22* 1.05*  CALCIUM 9.8  --  9.3 8.7*  MG  --   --  1.9 1.9  PHOS  --   --   --  2.8   Liver Function Tests: Recent Labs  Lab 05/23/23 0006  AST 31  ALT 20  ALKPHOS 65  BILITOT 0.9  PROT 6.3*  ALBUMIN 3.8   No results for input(s): "LIPASE", "AMYLASE" in the last 168 hours. No results for input(s): "AMMONIA" in the last 168 hours. Cardiac Enzymes: No results for input(s): "CKTOTAL", "CKMB", "CKMBINDEX", "TROPONINI" in the last 168 hours. BNP (last 3 results) Recent Labs    05/23/23 1804  BNP 445.1*    ProBNP (last 3 results) No results for input(s): "PROBNP" in the last 8760 hours.  CBG: Recent Labs  Lab  05/25/23 0520 05/25/23 0806  GLUCAP 95 122*   Recent Results (from the past 240 hours)  Expectorated Sputum Assessment w Gram Stain, Rflx to Resp Cult     Status: None   Collection Time: 05/23/23  6:28 PM   Specimen: Expectorated Sputum  Result Value Ref Range Status   Specimen Description EXPECTORATED SPUTUM  Final   Special Requests NONE  Final   Sputum evaluation   Final    THIS SPECIMEN IS ACCEPTABLE FOR SPUTUM CULTURE Performed at [facility], [address], [place], [place] [address]    Report Status 05/24/2023 FINAL  Final  Culture, Respiratory w Gram Stain     Status: None (Preliminary result)   Collection Time: 05/23/23  6:28 PM  Result Value Ref Range Status   Specimen Description EXPECTORATED SPUTUM  Final   Special Requests NONE Reflexed from [id number]  Final   Gram Stain   Final    FEW SQUAMOUS EPITHELIAL CELLS PRESENT WBC PRESENT, PREDOMINANTLY PMN ABUNDANT GRAM POSITIVE COCCI RARE GRAM NEGATIVE RODS Performed at [facility], [address] [place], [place] [address]    Culture PENDING  Incomplete   Report Status PENDING  Incomplete  Respiratory (~20 pathogens) panel by PCR     Status: Abnormal   Collection Time: 05/23/23  6:31 PM   Specimen: Nasopharyngeal Swab; Respiratory  Result Value Ref Range Status   Adenovirus NOT DETECTED NOT DETECTED Final   Coronavirus 229E NOT DETECTED NOT DETECTED Final    Comment: (NOTE) The Coronavirus on the Respiratory Panel, DOES NOT test for the novel  Coronavirus (2019 nCoV)    Coronavirus HKU1 NOT DETECTED NOT DETECTED Final   Coronavirus NL63 NOT DETECTED NOT DETECTED Final   Coronavirus OC43 NOT DETECTED NOT DETECTED Final   Metapneumovirus NOT DETECTED NOT DETECTED Final   Rhinovirus / Enterovirus NOT DETECTED NOT DETECTED Final   Influenza A NOT DETECTED NOT DETECTED Final   Influenza B NOT DETECTED NOT DETECTED Final   Parainfluenza Virus 1 NOT DETECTED NOT DETECTED Final   Parainfluenza Virus 2 NOT  DETECTED NOT DETECTED Final   Parainfluenza Virus 3 NOT DETECTED NOT DETECTED Final   Parainfluenza Virus 4 NOT DETECTED NOT DETECTED Final   Respiratory Syncytial Virus DETECTED (A) NOT DETECTED Final   Bordetella pertussis NOT DETECTED NOT DETECTED Final   Bordetella Parapertussis NOT DETECTED NOT DETECTED Final   Chlamydophila pneumoniae NOT DETECTED NOT DETECTED Final   Mycoplasma pneumoniae NOT DETECTED NOT DETECTED Final    Comment: Performed at [facility], [address] [place], [place] [address]     Studies: CT CHEST WO CONTRAST Result Date: 05/23/2023 CLINICAL DATA:  rales.  Fall.  Cough. EXAM: CT CHEST WITHOUT CONTRAST TECHNIQUE: Multidetector CT imaging of the chest was performed following the standard protocol without IV contrast. RADIATION DOSE REDUCTION: This exam was performed according to the departmental dose-optimization program which includes automated exposure control, adjustment of the mA and/or kV according to patient size and/or use of iterative reconstruction technique. COMPARISON:  CT angiography chest from 12/31/2016. FINDINGS: Cardiovascular: Normal cardiac size. No pericardial effusion. No aortic aneurysm. There are coronary artery calcifications, in keeping with coronary artery disease. There are also severe peripheral atherosclerotic vascular calcifications of thoracic aorta and its major branches. Prosthetic aortic valve noted. Cardiac pacemaker leads noted. Mediastinum/Nodes: Visualized thyroid gland appears grossly unremarkable. No solid / cystic mediastinal masses. The esophagus is nondistended precluding optimal assessment. No mediastinal or axillary lymphadenopathy by size criteria. Evaluation of bilateral hila is limited due to lack on intravenous contrast: however, no large hilar lymphadenopathy identified. Lungs/Pleura: The central tracheo-bronchial tree is patent. There are scattered, patchy small groupings of focal bronchiectasis predominantly in the  periphery, with associated focal bronchial wall thickening. There also small centrilobular tree-in-bud configuration nodules in these areas. There is involvement of middle lobe, anterior segment of right upper lobe, inferior lingula and anterior segment of left upper lobe. In appropriate clinical settings findings favor pulmonary mycobacterium avium complex infection. No mass, consolidation, pleural effusion or pneumothorax. Upper Abdomen: Visualized upper abdominal viscera within normal limits. Musculoskeletal: The visualized soft tissues of the chest wall are grossly unremarkable. No suspicious osseous lesions. There are mild multilevel degenerative changes in the visualized spine. No acute rib fracture of vertebral compression fracture. Redemonstration of chronic Schmorl's node in the superior endplate of L1 vertebra, unchanged since the prior study from 2018. IMPRESSION: 1. No acute traumatic injury to the chest. 2. There are scattered, patchy small groupings of focal bronchiectasis predominantly in the periphery, with associated focal bronchial wall thickening. There  are also small centrilobular tree-in-bud configuration nodules in these areas. There is involvement of middle lobe, inferior lingula and anterior segments of bilateral upper lobes. 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 3. Multiple other nonacute observations, as described above. Aortic Atherosclerosis (ICD10-I70.0). Electronically Signed   By: Jules Schick M.D.   On: 05/23/2023 15:21      Joycelyn Das, MD  [facility] 05/25/2023  If 7PM-7AM, please contact night-coverage

## 2023-05-25 NOTE — Evaluation (Signed)
 Physical Therapy Evaluation Patient Details Name: [name] [name] MRN: [id number] DOB: March 27, 1929 Today's Date: 05/25/2023  History of Present Illness  Pt is a 89 y.o. female admitted 05/23/23 after a fall. EKG showed normal sinus rhythm. Xray chest negative for any acute cardiopulmonary disease. CT head scan was negative for acute findings as well including CT C-spine. PMH of paroxysmal atrial fibrillation tachybradycardia syndrome status post pacemaker implant, CKD stage IIIa, GERD, hypothyroidism, CAD status post stent, aortic valve replacement is s/p TAVR, essential hypertension, chronic anticoagulation with Eliquis.   Clinical Impression  Pt admitted with above diagnosis. PTA, pt was modI for functional mobility using a SPC and modI for ADLs. She requires assistance with stairs, transportation, medication management, and household chores. Pt resides alone in a two story house with 4 STE with BHR and a lift chair to the second floor. She has a caregiver present 3-4 days/week for ~4 hours. Pt currently with functional limitations due to the deficits listed below (see PT Problem List). She required CGA-minA for mobility using SPC during today's session. Pt is unsteady when upright and would benefit from a RW to increase stability. Pt will benefit from acute skilled PT to increase her independence and safety with mobility to allow discharge. Given the limited support available at home, her high fall risk, and high likelihood for injury and readmission recommend post-acute rehab <3 hours/day of therapy.     If plan is discharge home, recommend the following: A little help with walking and/or transfers;A little help with bathing/dressing/bathroom;Help with stairs or ramp for entrance;Assist for transportation;Direct supervision/assist for medications management;Assistance with cooking/housework   Can travel by private vehicle   Yes    Equipment Recommendations None recommended by PT (Pt already has  DME)  Recommendations for Other Services       Functional Status Assessment Patient has had a recent decline in their functional status and/or demonstrates limited ability to make significant improvements in function in a reasonable and predictable amount of time     Precautions / Restrictions Precautions Precautions: Fall Recall of Precautions/Restrictions: Impaired Restrictions Weight Bearing Restrictions Per Provider Order: No      Mobility  Bed Mobility Overal bed mobility: Needs Assistance Bed Mobility: Sit to Supine       Sit to supine: HOB elevated, Contact guard assist   General bed mobility comments: Pt sat up on R side of bed with HOB elevated to ~25deg and CGA at trunk to aid in coming upright. She took increased time to scoot fwd til feet supported.    Transfers Overall transfer level: Needs assistance Equipment used: Straight cane Transfers: Sit to/from Stand Sit to Stand: Min assist           General transfer comment: Pt stood from lowest bed height with SPC in RUE. She powered up with CGA. Good eccentric control in standing.    Ambulation/Gait Ambulation/Gait assistance: Min assist Gait Distance (Feet): 15 Feet Assistive device: Straight cane Gait Pattern/deviations: Step-to pattern, Shuffle, Wide base of support, Trunk flexed Gait velocity: reduced Gait velocity interpretation: <1.31 ft/sec, indicative of household ambulator   General Gait Details: Pt ambulated from EOB, around FOB, to recliner chair. She was unsteady when upright using SPC, evident postural sway R/L. Pt took very small slow steps and lacked floor clearence. Her gaze was fixed onto the floor. VC for sequencing.  Stairs            Wheelchair Mobility     Tilt Bed    Modified  Rankin (Stroke Patients Only)       Balance Overall balance assessment: Needs assistance Sitting-balance support: Feet supported Sitting balance-Leahy Scale: Good Sitting balance - Comments: Pt  sat EOB with supervision and was able to reach outside her BOS.   Standing balance support: Single extremity supported, During functional activity, Reliant on assistive device for balance Standing balance-Leahy Scale: Poor Standing balance comment: Pt unsteady when upright even with SPC. She would benefit from RW. No no overt LOB, but evident postural sway.                             Pertinent Vitals/Pain Pain Assessment Pain Assessment: No/denies pain    Home Living Family/patient expects to be discharged to:: Private residence Living Arrangements: Alone Available Help at Discharge: Personal care attendant;Family;Friend(s);Available PRN/intermittently (Caregiver 3-4 days a week from 10-2pm, but hours vary depending on appointment. Sister is local and could assist as needed.) Type of Home: House Home Access: Stairs to enter Entrance Stairs-Rails: Right;Left;Can reach both Entrance Stairs-Number of Steps: 4 Alternate Level Stairs-Number of Steps: Flight Home Layout: Two level;Able to live on main level with bedroom/bathroom;Full bath on main level Home Equipment: Rolling Walker (2 wheels);Cane - single point;Hand held shower head;Grab bars - tub/shower;Shower seat Additional Comments: Pt reports she just saw a specialist for her vision and is in the process of getting prisms in her glasses.    Prior Function Prior Level of Function : Needs assist;History of Falls (last six months)       Physical Assist : Mobility (physical);ADLs (physical) Mobility (physical): Stairs ADLs (physical): IADLs Mobility Comments: Ambulates with a SPC. Requires assistance with stairs to get into her home. Uses a lift chair to get to the second floor of her home. Pt has fallen a few times in the past 97mo. ADLs Comments: ModI with ADLs. Relies on caregiver for  medication management and transporation. Has a house cleaner that comes every couple of weeks.     Extremity/Trunk Assessment   Upper  Extremity Assessment Upper Extremity Assessment: Defer to OT evaluation    Lower Extremity Assessment Lower Extremity Assessment: Overall WFL for tasks assessed    Cervical / Trunk Assessment Cervical / Trunk Assessment: Kyphotic  Communication   Communication Communication: Impaired Factors Affecting Communication: Hearing impaired    Cognition Arousal: Alert Behavior During Therapy: WFL for tasks assessed/performed   PT - Cognitive impairments: No apparent impairments                       PT - Cognition Comments: Pt A,Ox4 Following commands: Intact       Cueing Cueing Techniques: Verbal cues, Gestural cues     General Comments General comments (skin integrity, edema, etc.): VSS on RA.    Exercises     Assessment/Plan    PT Assessment Patient needs continued PT services  PT Problem List Decreased balance;Decreased mobility;Decreased safety awareness       PT Treatment Interventions DME instruction;Gait training;Stair training;Functional mobility training;Therapeutic activities;Therapeutic exercise;Balance training;Neuromuscular re-education;Patient/family education    PT Goals (Current goals can be found in the Care Plan section)  Acute Rehab PT Goals Patient Stated Goal: Return Home PT Goal Formulation: With patient/family Time For Goal Achievement: 06/08/23 Potential to Achieve Goals: Fair    Frequency Min 2X/week     Co-evaluation               AM-PAC PT "6 Clicks" Mobility  Outcome Measure Help needed turning from your back to your side while in a flat bed without using bedrails?: A Little Help needed moving from lying on your back to sitting on the side of a flat bed without using bedrails?: A Little Help needed moving to and from a bed to a chair (including a wheelchair)?: A Little Help needed standing up from a chair using your arms (e.g., wheelchair or bedside chair)?: A Little Help needed to walk in hospital room?: A  Little Help needed climbing 3-5 steps with a railing? : A Lot 6 Click Score: 17    End of Session Equipment Utilized During Treatment: Gait belt Activity Tolerance: Patient limited by fatigue Patient left: in chair;with call bell/phone within reach;with chair alarm set Nurse Communication: Mobility status PT Visit Diagnosis: History of falling (Z91.81);Unsteadiness on feet (R26.81);Difficulty in walking, not elsewhere classified (R26.2)    Time: 0981-1914 PT Time Calculation (min) (ACUTE ONLY): 30 min   Charges:   PT Evaluation $PT Eval Moderate Complexity: 1 Mod   PT General Charges $$ ACUTE PT VISIT: 1 Visit         Cheri Guppy, PT, DPT Acute Rehabilitation Services Office: [phone] Secure Chat Preferred  Richardson Chiquito 05/25/2023, 12:52 PM

## 2023-05-26 DIAGNOSIS — Y92009 Unspecified place in unspecified non-institutional (private) residence as the place of occurrence of the external cause: Secondary | ICD-10-CM | POA: Diagnosis not present

## 2023-05-26 DIAGNOSIS — W19XXXA Unspecified fall, initial encounter: Secondary | ICD-10-CM | POA: Diagnosis not present

## 2023-05-26 DIAGNOSIS — J189 Pneumonia, unspecified organism: Secondary | ICD-10-CM | POA: Diagnosis not present

## 2023-05-26 LAB — BASIC METABOLIC PANEL
Anion gap: 10 (ref 5–15)
BUN: 15 mg/dL (ref 8–23)
CO2: 20 mmol/L — ABNORMAL LOW (ref 22–32)
Calcium: 9.4 mg/dL (ref 8.9–10.3)
Chloride: 104 mmol/L (ref 98–111)
Creatinine, Ser: 1.01 mg/dL — ABNORMAL HIGH (ref 0.44–1.00)
GFR, Estimated: 53 mL/min — ABNORMAL LOW (ref >60)
Glucose, Bld: 112 mg/dL — ABNORMAL HIGH (ref 70–99)
Potassium: 4.5 mmol/L (ref 3.5–5.1)
Sodium: 134 mmol/L — ABNORMAL LOW (ref 135–145)

## 2023-05-26 LAB — CULTURE, RESPIRATORY W GRAM STAIN: Culture: NORMAL

## 2023-05-26 LAB — CBC
HCT: 41.3 % (ref 36.0–46.0)
Hemoglobin: 14.2 g/dL (ref 12.0–15.0)
MCH: 34.2 pg — ABNORMAL HIGH (ref 26.0–34.0)
MCHC: 34.4 g/dL (ref 30.0–36.0)
MCV: 99.5 fL (ref 80.0–100.0)
Platelets: 144 10*3/uL — ABNORMAL LOW (ref 150–400)
RBC: 4.15 MIL/uL (ref 3.87–5.11)
RDW: 14.1 % (ref 11.5–15.5)
WBC: 6.2 10*3/uL (ref 4.0–10.5)
nRBC: 0 % (ref 0.0–0.2)

## 2023-05-26 LAB — GLUCOSE, CAPILLARY: Glucose-Capillary: 93 mg/dL (ref 70–99)

## 2023-05-26 LAB — MAGNESIUM: Magnesium: 1.9 mg/dL (ref 1.7–2.4)

## 2023-05-26 MED ORDER — ACETAMINOPHEN 325 MG PO TABS
650.0000 mg | ORAL_TABLET | Freq: Four times a day (QID) | ORAL | Status: DC | PRN
  Administered 2023-05-26: 650 mg via ORAL
  Filled 2023-05-26: qty 2

## 2023-05-26 NOTE — Progress Notes (Addendum)
 PROGRESS NOTE  [name] [name] XBJ:[id number] DOB: [birth year] DOA: 05/23/2023 PCP: Creola Corn, MD   LOS: 0 days   Brief narrative:  [name] [name] is a 89 y.o. female with past medical history of paroxysmal atrial fibrillation tachybradycardia syndrome status post pacemaker implant, CKD stage IIIa, GERD, hypothyroidism, CAD status post stent, aortic valve replacement is status post TAVR on January 29, 2017, essential hypertension, chronic anticoagulation with Eliquis presented to hospital after fall.  Patient stated that she was lightheaded with some double vision when that happened.  In the ED, patient stable.  BMP showed hyponatremia with sodium of 129 with potassium at 5.2.  Creatinine elevated at 1.7.  Urinalysis was negative.  EKG showed normal sinus rhythm. Xray chest negative for any acute cardiopulmonary disease.  CT head scan was negative for acute findings as well including CT C-spine.  Patient was then considered for admission to hospital for further evaluation and treatment.   Assessment/Plan: Principal Problem:   Fall at home, initial encounter Active Problems:   Essential hypertension   CAD in native artery   S/P TAVR (transcatheter aortic valve replacement)   Paroxysmal atrial fibrillation (HCC)   Hypothyroidism   Hypertension   Fall at home:   Likely secondary to volume depletion and recent infection. Continue fall precautions.  PT OT recommends skilled nursing facility placement on discharge. Continue to hold off with diuretic.  At baseline uses a walker for ambulation.   Volume depletion.  Continue oral hydration.  Improved  Paroxysmal atrial fibrillation: Status post TAVR. Continue Eliquis amiodarone and metoprolol.  Rate controlled at this time.   Hypothyroidism. Continue Synthroid.   Essential hypertension: Patient is on amlodipine, metoprolol at home.  Holding at this time.  His blood pressure of 99/67  Possible community-acquired pneumonia.  Respiratory  syncytial virus infection.   Failed outpatient treatment.  Abnormal CT scan concerning for MAI infection with bronchiectasis.   Sputum culture showing gram-positive cocci.  AFB smear and culture pending.  On prednisone.  No leukocytosis on presentation.  Respiratory viral panel showing RSV.  Pulmonary following.  Acute kidney injury with hyperkalemia.   Improved at this time.  Patient was on Bactrim as outpatient.  Hold off with Bactrim.  Received IV hydration.  Will discontinue.  Creatinine on presentation at 2.0.  Today's creatinine of 1.0..  Baseline creatinine between 1.2-1.4.   DVT prophylaxis: apixaban (ELIQUIS) tablet 2.5 mg Start: 05/23/23 1545 apixaban (ELIQUIS) tablet 2.5 mg   Disposition: Skilled nursing facility as per PT OT recommendation.  Medically stable for disposition.  Status is: Observation  The patient will require care spanning > 2 midnights and should be moved to inpatient because:  need for skilled nursing facility placement.    Code Status:     Code Status: Limited: Do not attempt resuscitation (DNR) -DNR-LIMITED -Do Not Intubate/DNI   Family Communication: None at bedside, to reach out to the patient's sister on the phone but was unable to reach  Consultants: PCCM  Procedures: None  Anti-infectives:  None at present.  Anti-infectives (From admission, onward)    None      Subjective: Today, patient was seen and examined at bedside.  Patient complains of mild cough.  No chest pain fever chills dyspnea.   Objective: Vitals:   05/26/23 0818 05/26/23 1129  BP:  99/67  Pulse:  88  Resp:  16  Temp:  97.7 F (36.5 C)  SpO2: 95% 91%    Intake/Output Summary (Last 24 hours) at 05/26/2023 1410  Last data filed at 05/26/2023 0641 Gross per 24 hour  Intake --  Output 950 ml  Net -950 ml   Filed Weights   05/23/23 0012 05/25/23 0503 05/26/23 0641  Weight: 48.1 kg 50.5 kg 45.7 kg   Body mass index is 16.76 kg/m.   Physical Exam:  GENERAL:  Patient is alert awake and oriented.  Elderly female, not in obvious distress, Communicative, appears chronically ill and deconditioned, thinly built, on room air HENT: No scleral pallor or icterus. Pupils equally reactive to light. Oral mucosa is slightly dry. NECK: is supple, no gross swelling noted. CHEST: Diminished breath sounds bilaterally.  Kyphosis noted.   CVS: S1 and S2 heard, no murmur. Regular rate and rhythm.  ABDOMEN: Soft, non-tender, bowel sounds are present. EXTREMITIES: No edema. CNS: Cranial nerves are intact. No focal motor deficits. SKIN: warm and dry without rashes.  Right elbow with abrasions covered with dressing, bruises/discoloration over the bilateral lower extremity.  Data Review: I have personally reviewed the following laboratory data and studies,  CBC: Recent Labs  Lab 05/23/23 0006 05/23/23 0027 05/24/23 1337 05/25/23 0349 05/26/23 0405  WBC 5.3  --  6.8 6.0 6.2  HGB 14.1 14.3 14.2 14.1 14.2  HCT 41.5 42.0 41.9 41.3 41.3  MCV 102.0*  --  100.0 99.8 99.5  PLT 154  --  153 140* 144*   Basic Metabolic Panel: Recent Labs  Lab 05/23/23 0006 05/23/23 0027 05/24/23 1337 05/25/23 0349 05/26/23 0405  NA 129* 127* 135 133* 134*  K 5.2* 5.2* 4.6 4.7 4.5  CL 95* 98 101 106 104  CO2 22  --  21* 24 20*  GLUCOSE 97 91 126* 99 112*  BUN 34* 33* 16 14 15   CREATININE 1.76* 2.00* 1.22* 1.05* 1.01*  CALCIUM 9.8  --  9.3 8.7* 9.4  MG  --   --  1.9 1.9 1.9  PHOS  --   --   --  2.8  --    Liver Function Tests: Recent Labs  Lab 05/23/23 0006  AST 31  ALT 20  ALKPHOS 65  BILITOT 0.9  PROT 6.3*  ALBUMIN 3.8   No results for input(s): "LIPASE", "AMYLASE" in the last 168 hours. No results for input(s): "AMMONIA" in the last 168 hours. Cardiac Enzymes: No results for input(s): "CKTOTAL", "CKMB", "CKMBINDEX", "TROPONINI" in the last 168 hours. BNP (last 3 results) Recent Labs    05/23/23 1804  BNP 445.1*    ProBNP (last 3 results) No results for  input(s): "PROBNP" in the last 8760 hours.  CBG: Recent Labs  Lab 05/25/23 0520 05/25/23 0806 05/26/23 0513  GLUCAP 95 122* 93   Recent Results (from the past 240 hours)  Expectorated Sputum Assessment w Gram Stain, Rflx to Resp Cult     Status: None   Collection Time: 05/23/23  6:28 PM   Specimen: Expectorated Sputum  Result Value Ref Range Status   Specimen Description EXPECTORATED SPUTUM  Final   Special Requests NONE  Final   Sputum evaluation   Final    THIS SPECIMEN IS ACCEPTABLE FOR SPUTUM CULTURE Performed at [facility], [address], [place], [place] [address]    Report Status 05/24/2023 FINAL  Final  Culture, Respiratory w Gram Stain     Status: None   Collection Time: 05/23/23  6:28 PM  Result Value Ref Range Status   Specimen Description EXPECTORATED SPUTUM  Final   Special Requests NONE Reflexed from [id number]  Final  Gram Stain   Final    FEW SQUAMOUS EPITHELIAL CELLS PRESENT WBC PRESENT, PREDOMINANTLY PMN ABUNDANT GRAM POSITIVE COCCI RARE GRAM NEGATIVE RODS    Culture   Final    MODERATE Normal respiratory flora-no Staph aureus or Pseudomonas seen Performed at [facility], [address], [place], [place] [address]    Report Status 05/26/2023 FINAL  Final  Respiratory (~20 pathogens) panel by PCR     Status: Abnormal   Collection Time: 05/23/23  6:31 PM   Specimen: Nasopharyngeal Swab; Respiratory  Result Value Ref Range Status   Adenovirus NOT DETECTED NOT DETECTED Final   Coronavirus 229E NOT DETECTED NOT DETECTED Final    Comment: (NOTE) The Coronavirus on the Respiratory Panel, DOES NOT test for the novel  Coronavirus (2019 nCoV)    Coronavirus HKU1 NOT DETECTED NOT DETECTED Final   Coronavirus NL63 NOT DETECTED NOT DETECTED Final   Coronavirus OC43 NOT DETECTED NOT DETECTED Final   Metapneumovirus NOT DETECTED NOT DETECTED Final   Rhinovirus / Enterovirus NOT DETECTED NOT DETECTED Final   Influenza A NOT DETECTED NOT DETECTED  Final   Influenza B NOT DETECTED NOT DETECTED Final   Parainfluenza Virus 1 NOT DETECTED NOT DETECTED Final   Parainfluenza Virus 2 NOT DETECTED NOT DETECTED Final   Parainfluenza Virus 3 NOT DETECTED NOT DETECTED Final   Parainfluenza Virus 4 NOT DETECTED NOT DETECTED Final   Respiratory Syncytial Virus DETECTED (A) NOT DETECTED Final   Bordetella pertussis NOT DETECTED NOT DETECTED Final   Bordetella Parapertussis NOT DETECTED NOT DETECTED Final   Chlamydophila pneumoniae NOT DETECTED NOT DETECTED Final   Mycoplasma pneumoniae NOT DETECTED NOT DETECTED Final    Comment: Performed at [facility], [address], [place], [place] [address]     Studies: No results found.    Joycelyn Das, MD  [facility] 05/26/2023  If 7PM-7AM, please contact night-coverage

## 2023-05-26 NOTE — [place] FL2 (Signed)
 [place] MEDICAID FL2 LEVEL OF CARE FORM     IDENTIFICATION  Patient Name: [name] [name] Birthdate: June 03, 1929 Sex: female Admission Date (Current Location): 05/23/2023  [facility] and [place] Number:  Producer, television/film/video and Address:  The . [facility], [address] [place], [place] [address]      Provider Number: [id number]  Attending Physician Name and Address:  Joycelyn Das, MD  Relative Name and Phone Number:       Current Level of Care: Hospital Recommended Level of Care: Skilled Nursing Facility Prior Approval Number:    Date Approved/Denied: 05/26/23 PASRR Number: [id number]  Discharge Plan: SNF    Current Diagnoses: Patient Active Problem List   Diagnosis Date Noted   Fall at home, initial encounter 05/23/2023   Hypercoagulable state due to persistent atrial fibrillation (HCC) 07/25/2022   Genetic testing 11/03/2020   Family history of breast cancer 09/01/2020   Personal history of skin cancer 09/01/2020   Malignant neoplasm of upper-outer quadrant of left breast in female, estrogen receptor positive (HCC) 08/26/2020   Hip fracture (HCC) 10/19/2018   Sick sinus syndrome (HCC)    Presence of permanent cardiac pacemaker    Paroxysmal atrial fibrillation (HCC)    Osteoarthritis    Hypothyroidism    Hypertension    Hyperlipidemia    Hemorrhoids    GERD (gastroesophageal reflux disease)    Diverticulosis    Coronary artery disease    Complication of anesthesia    Aortic stenosis, severe    Pericarditis 01/31/2017   Acute on chronic diastolic heart failure (HCC) 01/30/2017   Acute blood loss as cause of postoperative anemia 01/30/2017   S/P TAVR (transcatheter aortic valve replacement) 01/29/2017   CKD (chronic kidney disease)    CAD in native artery 12/20/2016   Anticoagulated 05/31/2016   Pacemaker-Medtronic 01/01/2012   Long term current use of anticoagulant therapy 06/18/2011   Severe aortic stenosis 09/01/2010    BRADYCARDIA-TACHYCARDIA SYNDROME 03/31/2010   HLD (hyperlipidemia) 03/30/2010   Essential hypertension 03/30/2010   Osteoarthritis 03/30/2010    Orientation RESPIRATION BLADDER Height & Weight     Self  Normal Incontinent Weight: 100 lb 11.2 oz (45.7 kg) Height:  5\' 5"  (165.1 cm)  BEHAVIORAL SYMPTOMS/MOOD NEUROLOGICAL BOWEL NUTRITION STATUS      Incontinent Diet  AMBULATORY STATUS COMMUNICATION OF NEEDS Skin   Limited Assist Verbally Normal                       Personal Care Assistance Level of Assistance              Functional Limitations Info  Sight, Hearing Sight Info: Impaired Hearing Info: Impaired      SPECIAL CARE FACTORS FREQUENCY  PT (By licensed PT), OT (By licensed OT)     PT Frequency: 5x OT Frequency: 3x            Contractures Contractures Info: Not present    Additional Factors Info  Code Status, Allergies Code Status Info: DNR Allergies Info: Penicillins, Tetanus Toxoid, Demerol, Clindamycin/lincomycin, Letrozole, Meningococcal Poly Tetanus Conj Vaccine Acwy, Moxifloxacin Hcl, Codeine           Current Medications (05/26/2023):  This is the current hospital active medication list Current Facility-Administered Medications  Medication Dose Route Frequency Provider Last Rate Last Admin   acetaminophen (TYLENOL) tablet 650 mg  650 mg Oral Q6H PRN Howerter, Justin B, DO   650 mg at 05/26/23 0411   albuterol (PROVENTIL) (  2.5 MG/3ML) 0.083% nebulizer solution 2.5 mg  2.5 mg Nebulization BID Pokhrel, Laxman, MD   2.5 mg at 05/26/23 0817   albuterol (PROVENTIL) (2.5 MG/3ML) 0.083% nebulizer solution 3 mL  3 mL Nebulization Q6H PRN Gertha Calkin, MD   3 mL at 05/25/23 1929   amiodarone (PACERONE) tablet 100 mg  100 mg Oral Daily Irena Cords V, MD   100 mg at 05/26/23 1610   apixaban (ELIQUIS) tablet 2.5 mg  2.5 mg Oral BID Gertha Calkin, MD   2.5 mg at 05/26/23 9604   bisacodyl (DULCOLAX) EC tablet 5 mg  5 mg Oral Daily PRN Gertha Calkin, MD        levothyroxine (SYNTHROID) tablet 125 mcg  125 mcg Oral QAC breakfast Gertha Calkin, MD   125 mcg at 05/26/23 0835   polyethylene glycol (MIRALAX / GLYCOLAX) packet 17 g  17 g Oral Daily PRN Gertha Calkin, MD       predniSONE (DELTASONE) tablet 20 mg  20 mg Oral Q breakfast Melody Comas B, MD   20 mg at 05/26/23 5409   sodium chloride flush (NS) 0.9 % injection 3 mL  3 mL Intravenous Q12H Gertha Calkin, MD   3 mL at 05/26/23 0836   sodium phosphate (FLEET) enema 1 enema  1 enema Rectal Once PRN Gertha Calkin, MD         Discharge Medications: Please see discharge summary for a list of discharge medications.  Relevant Imaging Results:  Relevant Lab Results:   Additional Information SSN:[id number]  Helene Kelp, LCSW

## 2023-05-26 NOTE — Progress Notes (Signed)
 [facility] night cross cover note:   Per patient's request, I added prn tylenol for chronic arthritic discomfort in the b/l hips.     Newton Pigg, DO Hospitalist

## 2023-05-26 NOTE — TOC Transition Note (Signed)
 Transition of Care [facility]) - Discharge Note   Patient Details  Name: [name] [name] MRN: [id number] Date of Birth: 16-May-1929  [facility]) CM/SW Contact:  Helene Kelp, LCSW Phone Number: 05/26/2023, 3:07 PM   Clinical Narrative:    CSW followed-up disposition recommendations (SNF placement).   CSW spoke with the patient to review SNF referral process per clinical recommendations and assessed the pt's SNF preference.   The patient expressed no preference.   The CSW contacted the patient's natural support: Nance Pew [phone] and reviewed the clinical recommendations and assess SNF preference.  The natural support expressed no preference, but wanted to be updated when SNF's have made a bed offer.   CSW referral efforts to support the patient's disposition FL2:  PASRR:  SNF referrals:   TOC Disposition follow-up needs  Please provide the patient or natural support with bed-offer updates.  Please continue with SNF placement efforts. Please update the clinical team to SNF placement efforts:  No other needs identified by this Clinical research associate currently. Patient needs and current disposition to be followed by      Barriers to Discharge: Continued Medical Work up   Patient Goals and CMS Choice Patient states their goals for this hospitalization and ongoing recovery are:: To go back home.          Discharge Placement                       Discharge Plan and Services Additional resources added to the After Visit Summary for                                       Social Drivers of Health (SDOH) Interventions SDOH Screenings   Food Insecurity: No Food Insecurity (05/23/2023)  Housing: Low Risk  (05/23/2023)  Transportation Needs: No Transportation Needs (05/23/2023)  Utilities: Not At Risk (05/23/2023)  Alcohol Screen: Low Risk  (01/16/2021)  Depression (PHQ2-9): Low Risk  (01/16/2021)  Financial Resource Strain: Low Risk  (01/16/2021)   Physical Activity: Insufficiently Active (01/16/2021)  Social Connections: Socially Isolated (05/23/2023)  Stress: No Stress Concern Present (01/16/2021)  Tobacco Use: Medium Risk (05/23/2023)     Readmission Risk Interventions     No data to display

## 2023-05-26 NOTE — Progress Notes (Signed)
 Mobility Specialist Progress Note:   05/26/23 0933  Mobility  Activity Transferred from bed to chair  Level of Assistance Contact guard assist, steadying assist  Assistive Device Other (Comment) (HHA)  Distance Ambulated (ft) 3 ft  Activity Response Tolerated well  Mobility Referral Yes  Mobility visit 1 Mobility  Mobility Specialist Start Time (ACUTE ONLY) [id number]  Mobility Specialist Stop Time (ACUTE ONLY) 0932  Mobility Specialist Time Calculation (min) (ACUTE ONLY) 4 min   Pt received in bed, agreeable to mobility session. Transferred B>C via CGA and HHA. Tolerated well, asx throughout. Left in chair with call bell in reach, alarm on, all needs met.    Feliciana Rossetti Mobility Specialist Please contact via Special educational needs teacher or  Rehab office at [phone]

## 2023-05-27 DIAGNOSIS — Z87891 Personal history of nicotine dependence: Secondary | ICD-10-CM

## 2023-05-27 DIAGNOSIS — J210 Acute bronchiolitis due to respiratory syncytial virus: Secondary | ICD-10-CM

## 2023-05-27 DIAGNOSIS — J479 Bronchiectasis, uncomplicated: Secondary | ICD-10-CM

## 2023-05-27 DIAGNOSIS — W19XXXA Unspecified fall, initial encounter: Secondary | ICD-10-CM | POA: Diagnosis not present

## 2023-05-27 DIAGNOSIS — J219 Acute bronchiolitis, unspecified: Secondary | ICD-10-CM | POA: Diagnosis not present

## 2023-05-27 DIAGNOSIS — Y92009 Unspecified place in unspecified non-institutional (private) residence as the place of occurrence of the external cause: Secondary | ICD-10-CM | POA: Diagnosis not present

## 2023-05-27 DIAGNOSIS — J189 Pneumonia, unspecified organism: Secondary | ICD-10-CM | POA: Diagnosis not present

## 2023-05-27 LAB — GLUCOSE, CAPILLARY: Glucose-Capillary: 95 mg/dL (ref 70–99)

## 2023-05-27 NOTE — Progress Notes (Signed)
 PROGRESS NOTE  [name] [name] UJW:[id number] DOB: 1929-06-20 DOA: 05/23/2023 PCP: Creola Corn, MD   LOS: 0 days   Brief narrative:  [name] [name] is a 89 y.o. female with past medical history of paroxysmal atrial fibrillation tachybradycardia syndrome status post pacemaker implant, CKD stage IIIa, GERD, hypothyroidism, CAD status post stent, aortic valve replacement is status post TAVR on January 29, 2017, essential hypertension, chronic anticoagulation with Eliquis presented to hospital after fall.  Patient stated that she was lightheaded with some double vision when that happened.  In the ED, patient stable.  BMP showed hyponatremia with sodium of 129 with potassium at 5.2.  Creatinine elevated at 1.7.  Urinalysis was negative.  EKG showed normal sinus rhythm. Xray chest negative for any acute cardiopulmonary disease.  CT head scan was negative for acute findings as well including CT C-spine.  Patient was then considered for admission to hospital for further evaluation and treatment.   Assessment/Plan: Principal Problem:   Fall at home, initial encounter Active Problems:   Essential hypertension   CAD in native artery   S/P TAVR (transcatheter aortic valve replacement)   Paroxysmal atrial fibrillation (HCC)   Hypothyroidism   Hypertension   Fall at home:   Likely secondary to volume depletion and recent infection. Continue fall precautions.  PT OT recommends skilled nursing facility placement on discharge. Continue to hold off with diuretic.  At baseline uses a walker for ambulation.   Volume depletion.  Continue oral hydration.  Improved  Paroxysmal atrial fibrillation: Status post TAVR. Continue Eliquis, amiodarone and metoprolol.  Rate controlled at this time.   Hypothyroidism. Continue Synthroid.   Essential hypertension: Patient is on amlodipine, metoprolol at home.  Holding at this time.  His blood pressure of 99/67  Possible community-acquired pneumonia.  Respiratory  syncytial virus infection.   Failed outpatient treatment.  Abnormal CT scan concerning for MAI infection with bronchiectasis.   Sputum culture showing gram-positive cocci.  AFB smear and culture pending.  On prednisone.  No leukocytosis on presentation.  Respiratory viral panel showing RSV.  Pulmonary following.  Acute kidney injury with hyperkalemia.   Improved at this time.  Patient was on Bactrim as outpatient.  Hold off with Bactrim.  Received IV hydration.  Will discontinue.  Creatinine on presentation at 2.0.  latest creatinine of 1.0..  Baseline creatinine between 1.2-1.4.   DVT prophylaxis: apixaban (ELIQUIS) tablet 2.5 mg Start: 05/23/23 1545 apixaban (ELIQUIS) tablet 2.5 mg   Disposition: Skilled nursing facility as per PT OT recommendation.  Medically stable for disposition.  Status is: Observation  The patient will require care spanning > 2 midnights and should be moved to inpatient because:  need for skilled nursing facility placement.    Code Status:     Code Status: Limited: Do not attempt resuscitation (DNR) -DNR-LIMITED -Do Not Intubate/DNI   Family Communication: None at bedside, again unable to reach the patient's sister on the phone today.  Consultants: PCCM  Procedures: None  Anti-infectives:  None at present.  Anti-infectives (From admission, onward)    None      Subjective: Today, patient was seen and examined at bedside.  Patient denies interval complaints.  Denies any pain, nausea, vomiting, fever, chills or rigor.   Objective: Vitals:   05/27/23 0527 05/27/23 0914  BP: (!) 150/79 (!) 121/92  Pulse: 71 76  Resp:  19  Temp: (!) 97.5 F (36.4 C)   SpO2: 93% 92%    Intake/Output Summary (Last 24 hours) at 05/27/2023  1025 Last data filed at 05/27/2023 0824 Gross per 24 hour  Intake 483 ml  Output --  Net 483 ml   Filed Weights   05/25/23 0503 05/26/23 0641 05/27/23 0527  Weight: 50.5 kg 45.7 kg 47.4 kg   Body mass index is 17.39  kg/m.   Physical Exam:  GENERAL: Patient is alert awake and oriented.  Elderly female, not in obvious distress, Communicative, appears chronically ill and deconditioned, thinly built, on room air HENT: No scleral pallor or icterus. Pupils equally reactive to light. Oral mucosa is slightly dry. NECK: is supple, no gross swelling noted. CHEST: Diminished breath sounds bilaterally.  Kyphosis noted.   CVS: S1 and S2 heard, no murmur. Regular rate and rhythm.  ABDOMEN: Soft, non-tender, bowel sounds are present. EXTREMITIES: No edema. CNS: Cranial nerves are intact. No focal motor deficits. SKIN: warm and dry without rashes.  Right elbow with abrasions covered with dressing, bruises/discoloration over the bilateral lower extremity.  Data Review: I have personally reviewed the following laboratory data and studies,  CBC: Recent Labs  Lab 05/23/23 0006 05/23/23 0027 05/24/23 1337 05/25/23 0349 05/26/23 0405  WBC 5.3  --  6.8 6.0 6.2  HGB 14.1 14.3 14.2 14.1 14.2  HCT 41.5 42.0 41.9 41.3 41.3  MCV 102.0*  --  100.0 99.8 99.5  PLT 154  --  153 140* 144*   Basic Metabolic Panel: Recent Labs  Lab 05/23/23 0006 05/23/23 0027 05/24/23 1337 05/25/23 0349 05/26/23 0405  NA 129* 127* 135 133* 134*  K 5.2* 5.2* 4.6 4.7 4.5  CL 95* 98 101 106 104  CO2 22  --  21* 24 20*  GLUCOSE 97 91 126* 99 112*  BUN 34* 33* 16 14 15   CREATININE 1.76* 2.00* 1.22* 1.05* 1.01*  CALCIUM 9.8  --  9.3 8.7* 9.4  MG  --   --  1.9 1.9 1.9  PHOS  --   --   --  2.8  --    Liver Function Tests: Recent Labs  Lab 05/23/23 0006  AST 31  ALT 20  ALKPHOS 65  BILITOT 0.9  PROT 6.3*  ALBUMIN 3.8   No results for input(s): "LIPASE", "AMYLASE" in the last 168 hours. No results for input(s): "AMMONIA" in the last 168 hours. Cardiac Enzymes: No results for input(s): "CKTOTAL", "CKMB", "CKMBINDEX", "TROPONINI" in the last 168 hours. BNP (last 3 results) Recent Labs    05/23/23 1804  BNP 445.1*     ProBNP (last 3 results) No results for input(s): "PROBNP" in the last 8760 hours.  CBG: Recent Labs  Lab 05/25/23 0520 05/25/23 0806 05/26/23 0513 05/27/23 0642  GLUCAP 95 122* 93 95   Recent Results (from the past 240 hours)  Expectorated Sputum Assessment w Gram Stain, Rflx to Resp Cult     Status: None   Collection Time: 05/23/23  6:28 PM   Specimen: Expectorated Sputum  Result Value Ref Range Status   Specimen Description EXPECTORATED SPUTUM  Final   Special Requests NONE  Final   Sputum evaluation   Final    THIS SPECIMEN IS ACCEPTABLE FOR SPUTUM CULTURE Performed at [facility], [address], [place], [place] [address]    Report Status 05/24/2023 FINAL  Final  Culture, Respiratory w Gram Stain     Status: None   Collection Time: 05/23/23  6:28 PM  Result Value Ref Range Status   Specimen Description EXPECTORATED SPUTUM  Final   Special Requests NONE Reflexed from [id number]  Final   Gram Stain   Final    FEW SQUAMOUS EPITHELIAL CELLS PRESENT WBC PRESENT, PREDOMINANTLY PMN ABUNDANT GRAM POSITIVE COCCI RARE GRAM NEGATIVE RODS    Culture   Final    MODERATE Normal respiratory flora-no Staph aureus or Pseudomonas seen Performed at [facility], [address], [place], [place] [address]    Report Status 05/26/2023 FINAL  Final  Respiratory (~20 pathogens) panel by PCR     Status: Abnormal   Collection Time: 05/23/23  6:31 PM   Specimen: Nasopharyngeal Swab; Respiratory  Result Value Ref Range Status   Adenovirus NOT DETECTED NOT DETECTED Final   Coronavirus 229E NOT DETECTED NOT DETECTED Final    Comment: (NOTE) The Coronavirus on the Respiratory Panel, DOES NOT test for the novel  Coronavirus (2019 nCoV)    Coronavirus HKU1 NOT DETECTED NOT DETECTED Final   Coronavirus NL63 NOT DETECTED NOT DETECTED Final   Coronavirus OC43 NOT DETECTED NOT DETECTED Final   Metapneumovirus NOT DETECTED NOT DETECTED Final   Rhinovirus / Enterovirus NOT DETECTED  NOT DETECTED Final   Influenza A NOT DETECTED NOT DETECTED Final   Influenza B NOT DETECTED NOT DETECTED Final   Parainfluenza Virus 1 NOT DETECTED NOT DETECTED Final   Parainfluenza Virus 2 NOT DETECTED NOT DETECTED Final   Parainfluenza Virus 3 NOT DETECTED NOT DETECTED Final   Parainfluenza Virus 4 NOT DETECTED NOT DETECTED Final   Respiratory Syncytial Virus DETECTED (A) NOT DETECTED Final   Bordetella pertussis NOT DETECTED NOT DETECTED Final   Bordetella Parapertussis NOT DETECTED NOT DETECTED Final   Chlamydophila pneumoniae NOT DETECTED NOT DETECTED Final   Mycoplasma pneumoniae NOT DETECTED NOT DETECTED Final    Comment: Performed at [facility], [address] [place], [place] [address]     Studies: No results found.    Joycelyn Das, MD  [facility] 05/27/2023  If 7PM-7AM, please contact night-coverage

## 2023-05-27 NOTE — Plan of Care (Signed)
  Problem: Education: Goal: Knowledge of condition and prescribed therapy will improve Outcome: Progressing   Problem: Cardiac: Goal: Will achieve and/or maintain adequate cardiac output Outcome: Progressing   Problem: Physical Regulation: Goal: Complications related to the disease process, condition or treatment will be avoided or minimized Outcome: Progressing   

## 2023-05-27 NOTE — TOC Progression Note (Addendum)
 Transition of Care [facility]) - Progression Note    Patient Details  Name: [name] [name] MRN: [id number] Date of Birth: [birth year]  [facility]) CM/SW Contact  Delilah Shan, LCSWA Phone Number: 05/27/2023, 10:52 AM  Clinical Narrative:     CSW followed up with patient and her HCPOA sister Olegario Messier regarding SNF bed offers. Patient has now decided that she would like to return home when medically ready for dc. She would like to return home with [facility] PT. Patient reports she has had HH services through [facility] before. CSW informed patient and her sister Olegario Messier that [facility] CM will follow up to assist with home needs. All questions answered. No further questions reported at this time. CSW informed CM. TOC will continue to follow.  Update- After discussing further with CM and Olegario Messier her sister. Patient has decided that she does want to pursue short term rehab. Patient would like to review bed offers with her sister Olegario Messier. Patients sister Olegario Messier informed CSW she will give CSW a call with SNF choice today.   Update- CSW spoke with Patient who accepted SNF bed offer with [facility]. Patient request to be on list for private room when available. CSW informed Kia with [facility]. Insurance authorization started [employer] ID# [id number]. CSW will continue to follow.   Expected Discharge Plan: Home w Home Health Services Barriers to Discharge: Continued Medical Work up  Expected Discharge Plan and Services       Living arrangements for the past 2 months: Single Family Home (Lives in a townhouse with stairs and has a chair lift for using the stairs.)                                       Social Determinants of Health (SDOH) Interventions SDOH Screenings   Food Insecurity: No Food Insecurity (05/23/2023)  Housing: Low Risk  (05/23/2023)  Transportation Needs: No Transportation Needs (05/23/2023)  Utilities: Not At Risk (05/23/2023)  Alcohol Screen: Low Risk  (01/16/2021)  Depression (PHQ2-9): Low Risk   (01/16/2021)  Financial Resource Strain: Low Risk  (01/16/2021)  Physical Activity: Insufficiently Active (01/16/2021)  Social Connections: Socially Isolated (05/23/2023)  Stress: No Stress Concern Present (01/16/2021)  Tobacco Use: Medium Risk (05/23/2023)    Readmission Risk Interventions     No data to display

## 2023-05-27 NOTE — Plan of Care (Signed)
   Problem: Education: Goal: Knowledge of General Education information will improve Description Including pain rating scale, medication(s)/side effects and non-pharmacologic comfort measures Outcome: Progressing   Problem: Activity: Goal: Risk for activity intolerance will decrease Outcome: Progressing   Problem: Safety: Goal: Ability to remain free from injury will improve Outcome: Progressing

## 2023-05-27 NOTE — Progress Notes (Signed)
 Mobility Specialist Progress Note;   05/27/23 1400  Mobility  Activity Transferred from bed to chair  Level of Assistance Contact guard assist, steadying assist  Assistive Device Other (Comment) (HHA)  Distance Ambulated (ft) 3 ft  Activity Response Tolerated well  Mobility Referral Yes  Mobility visit 1 Mobility  Mobility Specialist Start Time (ACUTE ONLY) 1400  Mobility Specialist Stop Time (ACUTE ONLY) 1415  Mobility Specialist Time Calculation (min) (ACUTE ONLY) 15 min   Pt agreeable to mobility. Required MinG assistance via HHA to safely transfer pt from bed to chair. VSS throughout and no c/o during session. Pt left in chair with all needs met, call bell in reach. Sister in room.   Caesar Bookman Mobility Specialist Please contact via SecureChat or [employer] [phone]

## 2023-05-27 NOTE — Consult Note (Addendum)
 I have seen and examined the patient. I have personally reviewed the clinical findings, laboratory findings, microbiological data and imaging studies. The assessment and treatment plan was discussed with the Nurse Practitioner. I agree with her/his recommendations except following additions/corrections.  89 year old female with prior medical history as below including paroxysmal A-fib with tacky/brady s/p syndrome PPM on AC, CKD, HTN and CAD s/p TAVR who presented to the ED via EMS from home due to fall with prior dizziness in the setting of taking OTC cold medicine for known URI.  H/o 10 lbs wight loss since a month ago. Labs on admit remarkable for hyponatremia, hyperkalemia and AKI.  UA unremarkable for UTI . 3/20 RSV positive.  3/20 sputum culture with normal respiratory flora.  3/20 AFB smear positive, AFB culture pending. No risk factors for TB with no h/o positive contact, IVDU, homelessness or incarceration.   Exam - elderly female sitting in the bed, not in acute distress, comfortable, HEENT wnl. Productive cough+. Heart RRR, abdomen soft, chest scattered rhonchi b/l. No signs of septic joint or pedal edema, No rashes. Awake, alert and oriented   Collect 2 more sputum AFB smear and cultures at least  8 hrs apart  Steroids course per Pulm Droplet/contact precautions.  Fu in ID clinic has been arranged on 4/25 at 10 am to fu on sputum cultures and +/- need for treatment. ID will so for now, recall back with questions or concerns.   I have personally spent 82 minutes involved in face-to-face and non-face-to-face activities for this patient on the day of the visit. Professional time spent includes the following activities: Preparing to see the patient (review of tests), Obtaining and/or reviewing separately obtained history (admission/discharge record), Performing a medically appropriate examination and/or evaluation , Ordering medications/tests/procedures, referring and communicating with  other health care professionals, Documenting clinical information in the EMR, Independently interpreting results (not separately reported), Communicating results to the patient/family/caregiver, Counseling and educating the patient/family/caregiver and Care coordination (not separately reported).          [facility]    Date of Admission:  05/23/2023     Total days of antibiotics 0             Reason for Consult: Abnormal Chest CT - concern for MAI    Referring Provider: Pokhrel / PCCM Primary Care Provider: Creola Corn, MD   Assessment: [name] [name] is a 89 y.o. female admitted from home with fall in the setting of RSV infection and chronic anticoagulation.  During work up she had a CT scan of chest obtained which revealed concern for tree in bud nodularity   Acute RSV Bronchiolitis -  Bronchiectasis -  Abnormal Chest CT - Undergoing treatment for RSV bronchiolitis as acute problem here. PCCM team directing care and following. She is improving. 5d course of prednisone and nebulizer along with airway clearance.  Regarding abnormal chest CT with findings concern for changes d/t indolent MAI infection. She has multiple drug allergies/intolerances noted. She has 2 expectorated cultures pending with spears pending as well. Will arrange outpatient follow up with ID in 4 weeks to discuss further and outline plan for treatment vs watch and wait approach if that aligns better with her goals of care.  No risk factors for tuberculosis - CT chest with no cavitations, only tree-in-bud nodular findings c/w MAI picture.  -continue with acute RSV supportive treatment  -pulmonary hygiene is very important and will need to be upkept  -unclear  if she would like to proceed with treatment if it is indicated  -FU in ID clinic in 4 weeks - 4/25 @ 10:00 with Dr. Elinor Parkinson  -FU 2 AFB sputum specimens   She was recommended SNF to help at discharge.   ID will sign off - please  call back with any questions/concerns or if we can be of further assistance.     Plan: No indications for TB risk here - most likely this is MAI  No isolation recommended  OP follow up has been arranged.    Principal Problem:   Fall at home, initial encounter Active Problems:   Essential hypertension   CAD in native artery   S/P TAVR (transcatheter aortic valve replacement)   Paroxysmal atrial fibrillation (HCC)   Hypothyroidism   Hypertension    amiodarone  100 mg Oral Daily   apixaban  2.5 mg Oral BID   levothyroxine  125 mcg Oral QAC breakfast   predniSONE  20 mg Oral Q breakfast   sodium chloride flush  3 mL Intravenous Q12H    HPI: [name] [name] is a 89 y.o. female admitted via EMS level 2 trauma following a fall at time on chronic anticoagulation.   She got dizzy and fell in the bathroom and hit her head while getting ready for bed one night. This is not uncommon for her. Her bathroom is carpeted. Hit her right hip, right elbow and her head on the left forehead. She called 911 and they brought her to ER.   Multiple drug allergies noted - PCN, Clindamycin and Moxifloxacin (hallucinations 2023).  past medical history of CKD stage III AA, GERD, hypothyroidism, heart disease status post stents  S/P TAVR Nov 2018, PPM in place. Follows with cards regularly.   She was in normal state of health until 6d prior to admission when she started dealing with an URI symptoms. She was taking bactrim for sinusitis symptoms and cough. RSV ( + ) on NP swab. CT scan of chest was abnormal with concern for underlying MAI - Pulmonology was first consulted. Recommended against bronch. Bronchiectasis measures / pulm hygiene started. The smear from the sample 3/20 based on verbal report was positive.  She reports a cough that escalates with the "sinus season:"  She feels like her cough is a little better now.  She lives by herself - has some help that comes in a few hours a day to provide ADL  assistance.  She has a remote history of travel. Denies any drug use. Never un-housed. No incarceration time.  She has a sister in law who has MAC and a dear friend whom has it.  She has lost about 10#s over the last 1 month. She feels like her appetite is OK    Review of Systems: Review of Systems  Constitutional:  Negative for chills, fever and malaise/fatigue.  Respiratory:  Positive for cough, sputum production and shortness of breath. Negative for hemoptysis and wheezing.   Cardiovascular:  Negative for chest pain.  Gastrointestinal:  Negative for abdominal pain, nausea and vomiting.  Genitourinary:  Negative for dysuria.  Neurological:  Positive for dizziness.    Past Medical History:  Diagnosis Date   Anemia    CKD (chronic kidney disease)    Complication of anesthesia    Coronary artery disease    a. 12/20/2016: s/p DES to LAD    Diverticulosis    Family history of adverse reaction to anesthesia    sister also has post-op n/v  Family history of breast cancer 09/01/2020   GERD (gastroesophageal reflux disease)    Hemorrhoids    Hyperlipidemia    Hypertension    Hypothyroidism    Osteoarthritis    Paroxysmal atrial fibrillation (HCC)    a. on OAC with Xarelto but switched to Eliquis after stenting.    Personal history of skin cancer 09/01/2020   PONV (postoperative nausea and vomiting)    Presence of permanent cardiac pacemaker    S/P TAVR (transcatheter aortic valve replacement) 01/29/2017   23 mm Edwards Sapien 3 transcatheter heart valve placed via percutaneous right transfemoral approach    Past Surgical History:  Procedure Laterality Date   ABDOMINAL HYSTERECTOMY  1980   BASAL CELL CARCINOMA EXCISION     BREAST LUMPECTOMY WITH RADIOACTIVE SEED LOCALIZATION Left 10/04/2020   Procedure: LEFT BREAST LUMPECTOMY WITH RADIOACTIVE SEED LOCALIZATION;  Surgeon: Griselda Miner, MD;  Location: [facility] OR;  Service: General;  Laterality: Left;   CARDIAC CATHETERIZATION  ~  11/2016   CATARACT EXTRACTION W/ INTRAOCULAR LENS  IMPLANT, BILATERAL Bilateral    CORONARY ANGIOPLASTY WITH STENT PLACEMENT  12/20/2016   "1 stent"   CORONARY STENT INTERVENTION N/A 12/20/2016   Procedure: CORONARY STENT INTERVENTION;  Surgeon: Swaziland, Peter M, MD;  Location: [facility] INVASIVE CV LAB;  Service: Cardiovascular;  Laterality: N/A;   FINGER SURGERY Left    "fell; developed skiers thumb; had to operate on it"   INSERT / REPLACE / REMOVE PACEMAKER  1993   for syptomatic bradycardia and syncope -- in [facility]   INTRAMEDULLARY (IM) NAIL INTERTROCHANTERIC Left 10/19/2018   Procedure: INTRAMEDULLARY (IM) NAIL INTERTROCHANTRIC;  Surgeon: Myrene Galas, MD;  Location: [facility] OR;  Service: Orthopedics;  Laterality: Left;   LEAD REVISION/REPAIR N/A 10/09/2016   New left subclavian MDT Adapta L PPM dual chamber system implanted by Dr Johney Frame with previously placed R subclavian system abandoned   PACEMAKER GENERATOR CHANGE  2001   pulse generator replacement by Dr. March Rummage GENERATOR CHANGE  04/01/2008   PPM Medtronic -- model # [id number] serial # [id number] -- pulse generator replacement by Dr. Reyes Ivan    RIGHT/LEFT HEART CATH AND CORONARY ANGIOGRAPHY N/A 11/27/2016   Procedure: RIGHT/LEFT HEART CATH AND CORONARY ANGIOGRAPHY;  Surgeon: Swaziland, Peter M, MD;  Location: [facility] INVASIVE CV LAB;  Service: Cardiovascular;  Laterality: N/A;   SQUAMOUS CELL CARCINOMA EXCISION     TEE WITHOUT CARDIOVERSION N/A 01/29/2017   Procedure: TRANSESOPHAGEAL ECHOCARDIOGRAM (TEE);  Surgeon: Tonny Bollman, MD;  Location: [facility] OR;  Service: Open Heart Surgery;  Laterality: N/A;   TONSILLECTOMY     TRANSCATHETER AORTIC VALVE REPLACEMENT, TRANSFEMORAL N/A 01/29/2017   Procedure: TRANSCATHETER AORTIC VALVE REPLACEMENT, TRANSFEMORAL;  Surgeon: Tonny Bollman, MD;  Location: [facility] OR;  Service: Open Heart Surgery;  Laterality: N/A;    Social History   Tobacco Use   Smoking status: Former    Current packs/day: 0.00     Average packs/day: 1.5 packs/day for 30.0 years (45.0 ttl pk-yrs)    Types: Cigarettes    Start date: 03/05/1948    Quit date: 03/05/1978    Years since quitting: 45.2   Smokeless tobacco: Never  Vaping Use   Vaping status: Never Used  Substance Use Topics   Alcohol use: No   Drug use: No    Family History  Problem Relation Age of Onset   Alzheimer's disease Mother    Kidney cancer Father        dx 42s   Breast  cancer Maternal Aunt 85   Breast cancer Cousin        maternal cousin, dx >50   Allergies  Allergen Reactions   Penicillins Anaphylaxis and Other (See Comments)    Has patient had a PCN reaction causing immediate rash, facial/tongue/throat swelling, SOB or lightheadedness with hypotension: Yes Has patient had a PCN reaction causing severe rash involving mucus membranes or skin necrosis: No Has patient had a PCN reaction that required hospitalization: No Has patient had a PCN reaction occurring within the last 10 years: No If all of the above answers are "NO", then may proceed with Cephalosporin use.    Tetanus Toxoid Anaphylaxis   Demerol Nausea Only    Severe nausea   Clindamycin/Lincomycin     Sores in mouth   Letrozole Other (See Comments)   Meningococcal Poly Tetanus Conj Vaccine Acwy Other (See Comments)   Moxifloxacin Hcl Other (See Comments)   Codeine Nausea Only    Severe nausea    OBJECTIVE: Blood pressure (!) 146/64, pulse 79, temperature 97.9 F (36.6 C), temperature source Oral, resp. rate 18, height 5\' 5"  (1.651 m), weight 47.4 kg, SpO2 92%.  Physical Exam Constitutional:      Appearance: Normal appearance. She is ill-appearing.  HENT:     Mouth/Throat:     Mouth: Mucous membranes are moist.     Pharynx: Oropharynx is clear.  Cardiovascular:     Rate and Rhythm: Normal rate.  Pulmonary:     Effort: Pulmonary effort is normal. No respiratory distress.     Breath sounds: Rhonchi (frequent cough, no dyspnea during conversation) present.   Abdominal:     General: Bowel sounds are normal. There is no distension.     Tenderness: There is no abdominal tenderness.  Skin:    General: Skin is warm and dry.  Neurological:     Mental Status: She is alert and oriented to person, place, and time.     Lab Results Lab Results  Component Value Date   WBC 6.2 05/26/2023   HGB 14.2 05/26/2023   HCT 41.3 05/26/2023   MCV 99.5 05/26/2023   PLT 144 (L) 05/26/2023    Lab Results  Component Value Date   CREATININE 1.01 (H) 05/26/2023   BUN 15 05/26/2023   NA 134 (L) 05/26/2023   K 4.5 05/26/2023   CL 104 05/26/2023   CO2 20 (L) 05/26/2023    Lab Results  Component Value Date   ALT 20 05/23/2023   AST 31 05/23/2023   ALKPHOS 65 05/23/2023   BILITOT 0.9 05/23/2023     Microbiology: Recent Results (from the past 240 hours)  Expectorated Sputum Assessment w Gram Stain, Rflx to Resp Cult     Status: None   Collection Time: 05/23/23  6:28 PM   Specimen: Expectorated Sputum  Result Value Ref Range Status   Specimen Description EXPECTORATED SPUTUM  Final   Special Requests NONE  Final   Sputum evaluation   Final    THIS SPECIMEN IS ACCEPTABLE FOR SPUTUM CULTURE Performed at [facility], [address] [place], [place] [address]    Report Status 05/24/2023 FINAL  Final  Culture, Respiratory w Gram Stain     Status: None   Collection Time: 05/23/23  6:28 PM  Result Value Ref Range Status   Specimen Description EXPECTORATED SPUTUM  Final   Special Requests NONE Reflexed from [id number]  Final   Gram Stain   Final    FEW SQUAMOUS EPITHELIAL CELLS  PRESENT WBC PRESENT, PREDOMINANTLY PMN ABUNDANT GRAM POSITIVE COCCI RARE GRAM NEGATIVE RODS    Culture   Final    MODERATE Normal respiratory flora-no Staph aureus or Pseudomonas seen Performed at [facility], [address], [place], [place] [address]    Report Status 05/26/2023 FINAL  Final  Respiratory (~20 pathogens) panel by PCR     Status: Abnormal    Collection Time: 05/23/23  6:31 PM   Specimen: Nasopharyngeal Swab; Respiratory  Result Value Ref Range Status   Adenovirus NOT DETECTED NOT DETECTED Final   Coronavirus 229E NOT DETECTED NOT DETECTED Final    Comment: (NOTE) The Coronavirus on the Respiratory Panel, DOES NOT test for the novel  Coronavirus (2019 nCoV)    Coronavirus HKU1 NOT DETECTED NOT DETECTED Final   Coronavirus NL63 NOT DETECTED NOT DETECTED Final   Coronavirus OC43 NOT DETECTED NOT DETECTED Final   Metapneumovirus NOT DETECTED NOT DETECTED Final   Rhinovirus / Enterovirus NOT DETECTED NOT DETECTED Final   Influenza A NOT DETECTED NOT DETECTED Final   Influenza B NOT DETECTED NOT DETECTED Final   Parainfluenza Virus 1 NOT DETECTED NOT DETECTED Final   Parainfluenza Virus 2 NOT DETECTED NOT DETECTED Final   Parainfluenza Virus 3 NOT DETECTED NOT DETECTED Final   Parainfluenza Virus 4 NOT DETECTED NOT DETECTED Final   Respiratory Syncytial Virus DETECTED (A) NOT DETECTED Final   Bordetella pertussis NOT DETECTED NOT DETECTED Final   Bordetella Parapertussis NOT DETECTED NOT DETECTED Final   Chlamydophila pneumoniae NOT DETECTED NOT DETECTED Final   Mycoplasma pneumoniae NOT DETECTED NOT DETECTED Final    Comment: Performed at [facility], [address], [place], [place] [address]   Imaging CT CHEST WO CONTRAST Result Date: 05/23/2023 CLINICAL DATA:  rales.  Fall.  Cough. EXAM: CT CHEST WITHOUT CONTRAST TECHNIQUE: Multidetector CT imaging of the chest was performed following the standard protocol without IV contrast. RADIATION DOSE REDUCTION: This exam was performed according to the departmental dose-optimization program which includes automated exposure control, adjustment of the mA and/or kV according to patient size and/or use of iterative reconstruction technique. COMPARISON:  CT angiography chest from 12/31/2016. FINDINGS: Cardiovascular: Normal cardiac size. No pericardial effusion. No aortic aneurysm.  There are coronary artery calcifications, in keeping with coronary artery disease. There are also severe peripheral atherosclerotic vascular calcifications of thoracic aorta and its major branches. Prosthetic aortic valve noted. Cardiac pacemaker leads noted. Mediastinum/Nodes: Visualized thyroid gland appears grossly unremarkable. No solid / cystic mediastinal masses. The esophagus is nondistended precluding optimal assessment. No mediastinal or axillary lymphadenopathy by size criteria. Evaluation of bilateral hila is limited due to lack on intravenous contrast: however, no large hilar lymphadenopathy identified. Lungs/Pleura: The central tracheo-bronchial tree is patent. There are scattered, patchy small groupings of focal bronchiectasis predominantly in the periphery, with associated focal bronchial wall thickening. There also small centrilobular tree-in-bud configuration nodules in these areas. There is involvement of middle lobe, anterior segment of right upper lobe, inferior lingula and anterior segment of left upper lobe. In appropriate clinical settings findings favor pulmonary mycobacterium avium complex infection. No mass, consolidation, pleural effusion or pneumothorax. Upper Abdomen: Visualized upper abdominal viscera within normal limits. Musculoskeletal: The visualized soft tissues of the chest wall are grossly unremarkable. No suspicious osseous lesions. There are mild multilevel degenerative changes in the visualized spine. No acute rib fracture of vertebral compression fracture. Redemonstration of chronic Schmorl's node in the superior endplate of L1 vertebra, unchanged since the prior study  from 2018. IMPRESSION: 1. No acute traumatic injury to the chest. 2. There are scattered, patchy small groupings of focal bronchiectasis predominantly in the periphery, with associated focal bronchial wall thickening. There are also small centrilobular tree-in-bud configuration nodules in these areas. There is  involvement of middle lobe, inferior lingula and anterior segments of bilateral upper lobes. 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 3. Multiple other nonacute observations, as described above. Aortic Atherosclerosis (ICD10-I70.0). Electronically Signed   By: Jules Schick M.D.   On: 05/23/2023 15:21   DG Chest Port 1 View Result Date: 05/23/2023 CLINICAL DATA:  Level 2 trauma, fall, cough EXAM: PORTABLE CHEST 1 VIEW COMPARISON:  07/09/2022 FINDINGS: Stable cardiomediastinal silhouette. Coronary stenting. TAVR. Left chest wall pacemaker. Disconnected right chest wall pacer aids. Aortic atherosclerotic calcification. Hyperinflation and chronic bronchitic changes. Chronic blunting of the costophrenic angles. No focal consolidation or pneumothorax. No displaced rib fractures. IMPRESSION: No acute cardiopulmonary disease. COPD. Electronically Signed   By: Minerva Fester M.D.   On: 05/23/2023 00:44   CT HEAD WO CONTRAST Result Date: 05/23/2023 CLINICAL DATA:  Moderate-severe head trauma.  Blunt polytrauma. EXAM: CT HEAD WITHOUT CONTRAST CT CERVICAL SPINE WITHOUT CONTRAST TECHNIQUE: Multidetector CT imaging of the head and cervical spine was performed following the standard protocol without intravenous contrast. Multiplanar CT image reconstructions of the cervical spine were also generated. RADIATION DOSE REDUCTION: This exam was performed according to the departmental dose-optimization program which includes automated exposure control, adjustment of the mA and/or kV according to patient size and/or use of iterative reconstruction technique. COMPARISON:  CT head 09/12/2022 and cervical spine CT 12/17/2020 FINDINGS: CT HEAD FINDINGS Brain: No intracranial hemorrhage, mass effect, or evidence of acute infarct. No hydrocephalus. No extra-axial fluid collection. Age related cerebral atrophy and chronic small vessel ischemic disease.  Vascular: No hyperdense vessel. Intracranial arterial calcification. Skull: No fracture or focal lesion. Sinuses/Orbits: No acute finding. Other: None. CT CERVICAL SPINE FINDINGS Alignment: No evidence of traumatic malalignment. Skull base and vertebrae: No acute fracture. No primary bone lesion or focal pathologic process. Soft tissues and spinal canal: No prevertebral fluid or swelling. No visible canal hematoma. Disc levels: Multilevel spondylosis and facet arthropathy is not significantly changed from 12/17/2020. No severe spinal canal narrowing. Upper chest: No acute abnormality. Other: Carotid calcification. IMPRESSION: 1. No acute intracranial abnormality. 2. No acute fracture in the cervical spine. 3. Chronic sphenoid sinusitis. Electronically Signed   By: Minerva Fester M.D.   On: 05/23/2023 00:42   CT CERVICAL SPINE WO CONTRAST Result Date: 05/23/2023 CLINICAL DATA:  Moderate-severe head trauma.  Blunt polytrauma. EXAM: CT HEAD WITHOUT CONTRAST CT CERVICAL SPINE WITHOUT CONTRAST TECHNIQUE: Multidetector CT imaging of the head and cervical spine was performed following the standard protocol without intravenous contrast. Multiplanar CT image reconstructions of the cervical spine were also generated. RADIATION DOSE REDUCTION: This exam was performed according to the departmental dose-optimization program which includes automated exposure control, adjustment of the mA and/or kV according to patient size and/or use of iterative reconstruction technique. COMPARISON:  CT head 09/12/2022 and cervical spine CT 12/17/2020 FINDINGS: CT HEAD FINDINGS Brain: No intracranial hemorrhage, mass effect, or evidence of acute infarct. No hydrocephalus. No extra-axial fluid collection. Age related cerebral atrophy and chronic small vessel ischemic disease. Vascular: No hyperdense vessel. Intracranial arterial calcification. Skull: No fracture or focal lesion. Sinuses/Orbits: No acute finding. Other: None. CT CERVICAL SPINE  FINDINGS Alignment: No evidence of traumatic malalignment. Skull base and  vertebrae: No acute fracture. No primary bone lesion or focal pathologic process. Soft tissues and spinal canal: No prevertebral fluid or swelling. No visible canal hematoma. Disc levels: Multilevel spondylosis and facet arthropathy is not significantly changed from 12/17/2020. No severe spinal canal narrowing. Upper chest: No acute abnormality. Other: Carotid calcification. IMPRESSION: 1. No acute intracranial abnormality. 2. No acute fracture in the cervical spine. 3. Chronic sphenoid sinusitis. Electronically Signed   By: Minerva Fester M.D.   On: 05/23/2023 00:42    Rexene Alberts, MSN, NP-C [facility]  [email or web address] Pager: [phone] Office: [phone] [facility] Main Line: [phone] *Secure Chat Communication Welcome

## 2023-05-27 NOTE — Progress Notes (Addendum)
 NAME:  [name] [name], MRN:  [id number], DOB:  1930/02/07, LOS: 0 ADMISSION DATE:  05/23/2023, CONSULTATION DATE:  3/20/225 REFERRING MD:  Dr. Allena Katz - [facility], CHIEF COMPLAINT:  Abnormla CT    History of Present Illness:  [name] [name] is a 89 year old female with a past medical history significant for CKD, CAD s/p DES to LAD, HTN, HLD, osteoarthritis, paroxysmal A-fib on anticoagulation at baseline, s/p TAVR, and anemia who presented to the ED 3/20 via EMS after a mechanical fall with head trauma on anticoagulation.  Patient reports on ambulation to bathroom she became dizzy and fell hitting her head.  Patient reports she has been self-medicating for URI symptoms with Tessalon Perles and over-the-counter cold medications which she attributes to cause of dizziness.  She was also treated with bactrim as an outpatient by PCP. Patient denies loss of consciousness or vision changes post fall.  On ED arrival patient was seen hemodynamically stable with only mild hypertension.  Lab work significant for sodium 129, potassium 5.2, chloride 95, creatinine 1.75, INR within normal at 1.2.  Head and cervical spine CT negative for any acute abnormalities.  CT chest with no acute traumatic injuries but concerning for possible underlying MAC for which PCCM was consulted  Pertinent  Medical History  CKD, CAD s/p DES to LAD, HTN, HLD, osteoarthritis, paroxysmal A-fib on anticoagulation at baseline, s/p TAVR, and anemia  Significant Hospital Events: Including procedures, antibiotic start and stop dates in addition to other pertinent events   3/20 presented after mechanical fall with concern of head trauma on anticoagulation.  No traumatic injuries seen on CT.  Patient was admitted for mild AKI.  PCCM consulted for abnormal chest CT  Interim History / Subjective:  Feeling better today, on RA with sats of 92-94% while I was in the room Coughing up white frothy secretions  Sometimes  thin and sometimes  thick.  Using   flutter valve Pt has decided she would like to go to [facility] for rehab. I am unsure of when she will be discharged.    Objective   Blood pressure (!) 146/64, pulse 79, temperature 97.9 F (36.6 C), temperature source Oral, resp. rate 18, height 5\' 5"  (1.651 m), weight 47.4 kg, SpO2 92%.        Intake/Output Summary (Last 24 hours) at 05/27/2023 1345 Last data filed at 05/27/2023 1241 Gross per 24 hour  Intake 603 ml  Output --  Net 603 ml   Filed Weights   05/25/23 0503 05/26/23 0641 05/27/23 0527  Weight: 50.5 kg 45.7 kg 47.4 kg    Examination: General Pleasant 89 year old female patient she is lying in bed in no acute distress currently, on RA, speaking in full sentences. No distress  HEENT normocephalic atraumatic no jugular venous distention is appreciated, MM pink and dry, No LAD Pulmonary Bilateral chest excursion, some coarse scattered rhonchi no accessory use , + coarse rhonchus minimally productive cough  Cardiac regular rate and rhythm Abdomen soft non tender, ND, BS +, Body mass index is 17.39 kg/m.  Extremities warm dry brisk cap refill, no obvious deformities noted Neuro: Awake oriented x 3, MAE x 4, appropriate  Resolved Hospital Problem list     Assessment & Plan:  RSV bronchiolitis Abnormal CT Chest concerning for MAI infection Bronchiectasis, lower lobe predominant. PAF AKI and hyperkalemia in the setting of bactrim use (improving) Dehydration (hyponatremia with clinical hypovolemia)resolved  Hypothyroidism  H/o HTN H/o CAD HLD  Pulm problem list  RSV bronchiolitis  Abnormal CT chest w/  Bronchiectasis, lower lobe predominant. Plan Sputum Cx with moderate normal respiratory flora , no staph aureus or Pseudamonas ( GS >> ABUNDANT GRAM POSITIVE COCCI  RARE GRAM NEGATIVE RODS ) AFB in process  Cont TID airway clearance with albuterol, nebulized saline, followed by chest PT.  Flutter valve prn  Mobilize as able with PT/OT, OOB to  chair Continue prednisone 20mg  daily for 5 days (day 2 of 5)   Pt is improving. PCCM will check on Tuesday, and will most likely sign off.   Pulmonary APP Time 25 minutes  Bevelyn Ngo, MSN, AGACNP-BC [facility] Pulmonary/Critical Care Medicine See Amion for personal pager PCCM on call pager [phone]  05/27/2023 2:17 PM

## 2023-05-28 ENCOUNTER — Telehealth (HOSPITAL_BASED_OUTPATIENT_CLINIC_OR_DEPARTMENT_OTHER): Payer: Self-pay | Admitting: Pulmonary Disease

## 2023-05-28 DIAGNOSIS — Z952 Presence of prosthetic heart valve: Secondary | ICD-10-CM | POA: Diagnosis not present

## 2023-05-28 DIAGNOSIS — R278 Other lack of coordination: Secondary | ICD-10-CM | POA: Diagnosis not present

## 2023-05-28 DIAGNOSIS — Y92009 Unspecified place in unspecified non-institutional (private) residence as the place of occurrence of the external cause: Secondary | ICD-10-CM | POA: Diagnosis not present

## 2023-05-28 DIAGNOSIS — M47892 Other spondylosis, cervical region: Secondary | ICD-10-CM | POA: Diagnosis not present

## 2023-05-28 DIAGNOSIS — N179 Acute kidney failure, unspecified: Secondary | ICD-10-CM | POA: Diagnosis not present

## 2023-05-28 DIAGNOSIS — N1831 Chronic kidney disease, stage 3a: Secondary | ICD-10-CM | POA: Diagnosis not present

## 2023-05-28 DIAGNOSIS — E039 Hypothyroidism, unspecified: Secondary | ICD-10-CM | POA: Diagnosis not present

## 2023-05-28 DIAGNOSIS — M4692 Unspecified inflammatory spondylopathy, cervical region: Secondary | ICD-10-CM | POA: Diagnosis not present

## 2023-05-28 DIAGNOSIS — I480 Paroxysmal atrial fibrillation: Secondary | ICD-10-CM | POA: Diagnosis not present

## 2023-05-28 DIAGNOSIS — I4819 Other persistent atrial fibrillation: Secondary | ICD-10-CM | POA: Diagnosis not present

## 2023-05-28 DIAGNOSIS — J470 Bronchiectasis with acute lower respiratory infection: Secondary | ICD-10-CM | POA: Diagnosis not present

## 2023-05-28 DIAGNOSIS — G4709 Other insomnia: Secondary | ICD-10-CM | POA: Diagnosis not present

## 2023-05-28 DIAGNOSIS — M6281 Muscle weakness (generalized): Secondary | ICD-10-CM | POA: Diagnosis not present

## 2023-05-28 DIAGNOSIS — A312 Disseminated mycobacterium avium-intracellulare complex (DMAC): Secondary | ICD-10-CM | POA: Diagnosis not present

## 2023-05-28 DIAGNOSIS — I10 Essential (primary) hypertension: Secondary | ICD-10-CM | POA: Diagnosis not present

## 2023-05-28 DIAGNOSIS — E785 Hyperlipidemia, unspecified: Secondary | ICD-10-CM | POA: Diagnosis not present

## 2023-05-28 DIAGNOSIS — R5383 Other fatigue: Secondary | ICD-10-CM | POA: Diagnosis not present

## 2023-05-28 DIAGNOSIS — R531 Weakness: Secondary | ICD-10-CM | POA: Diagnosis not present

## 2023-05-28 DIAGNOSIS — R42 Dizziness and giddiness: Secondary | ICD-10-CM | POA: Diagnosis not present

## 2023-05-28 DIAGNOSIS — J449 Chronic obstructive pulmonary disease, unspecified: Secondary | ICD-10-CM | POA: Diagnosis not present

## 2023-05-28 DIAGNOSIS — Z950 Presence of cardiac pacemaker: Secondary | ICD-10-CM | POA: Diagnosis not present

## 2023-05-28 DIAGNOSIS — I2510 Atherosclerotic heart disease of native coronary artery without angina pectoris: Secondary | ICD-10-CM | POA: Diagnosis not present

## 2023-05-28 DIAGNOSIS — B974 Respiratory syncytial virus as the cause of diseases classified elsewhere: Secondary | ICD-10-CM | POA: Diagnosis not present

## 2023-05-28 DIAGNOSIS — J219 Acute bronchiolitis, unspecified: Secondary | ICD-10-CM | POA: Diagnosis not present

## 2023-05-28 DIAGNOSIS — Z7901 Long term (current) use of anticoagulants: Secondary | ICD-10-CM | POA: Diagnosis not present

## 2023-05-28 DIAGNOSIS — J189 Pneumonia, unspecified organism: Secondary | ICD-10-CM | POA: Diagnosis not present

## 2023-05-28 DIAGNOSIS — W19XXXA Unspecified fall, initial encounter: Secondary | ICD-10-CM | POA: Diagnosis not present

## 2023-05-28 DIAGNOSIS — Z7401 Bed confinement status: Secondary | ICD-10-CM | POA: Diagnosis not present

## 2023-05-28 DIAGNOSIS — S51011D Laceration without foreign body of right elbow, subsequent encounter: Secondary | ICD-10-CM | POA: Diagnosis not present

## 2023-05-28 LAB — ACID FAST SMEAR (AFB, MYCOBACTERIA): Acid Fast Smear: POSITIVE — AB

## 2023-05-28 LAB — GLUCOSE, CAPILLARY: Glucose-Capillary: 85 mg/dL (ref 70–99)

## 2023-05-28 MED ORDER — PREDNISONE 20 MG PO TABS: 20.0000 mg | ORAL_TABLET | Freq: Every day | ORAL | Status: AC

## 2023-05-28 MED ORDER — MOMETASONE FURO-FORMOTEROL FUM 200-5 MCG/ACT IN AERO
2.0000 | INHALATION_SPRAY | Freq: Two times a day (BID) | RESPIRATORY_TRACT | Status: DC
  Filled 2023-05-28: qty 8.8

## 2023-05-28 NOTE — Progress Notes (Signed)
 Nursing Discharge Note  Name: [name] [name] MRN: [id number] DOB: 1929/10/13  Admit Date: 05/23/2023 Discharge Date: 05/28/2023  [name] [name] to be discharged to a Skilled Nursing Facility per MD order.  AVS completed, placed in discharge packet for facility review. Discharge packet compiled for facility. Non-emergency ambulance transport arranged. Report called to [facility], spoke with Shadequa  at [phone].   Discharge Instructions   None      Allergies as of 05/28/2023       Reactions   Penicillins Anaphylaxis, Other (See Comments)   Has patient had a PCN reaction causing immediate rash, facial/tongue/throat swelling, SOB or lightheadedness with hypotension: Yes Has patient had a PCN reaction causing severe rash involving mucus membranes or skin necrosis: No Has patient had a PCN reaction that required hospitalization: No Has patient had a PCN reaction occurring within the last 10 years: No If all of the above answers are "NO", then may proceed with Cephalosporin use.   Tetanus Toxoid Anaphylaxis   Demerol Nausea Only   Severe nausea   Clindamycin/lincomycin    Sores in mouth   Letrozole Other (See Comments)   Meningococcal Poly Tetanus Conj Vaccine Acwy Other (See Comments)   Moxifloxacin Hcl Other (See Comments)   Codeine Nausea Only   Severe nausea        Medication List     STOP taking these medications    sulfamethoxazole-trimethoprim 800-160 MG tablet Commonly known as: BACTRIM DS       TAKE these medications    albuterol 108 (90 Base) MCG/ACT inhaler Commonly known as: VENTOLIN HFA Inhale 1 puff into the lungs every 6 (six) hours as needed for shortness of breath or wheezing.   amiodarone 200 MG tablet Commonly known as: PACERONE TAKE 1/2 TABLET(100 MG) BY MOUTH DAILY   amLODipine 5 MG tablet Commonly known as: NORVASC Take 0.5 tablets (2.5 mg total) by mouth daily. TAKE 1 TABLET(5 MG) BY MOUTH DAILY   apixaban 2.5 MG Tabs tablet Commonly  known as: Eliquis TAKE 1 TABLET(2.5 MG) BY MOUTH TWICE DAILY   benzonatate 200 MG capsule Commonly known as: TESSALON Take 200 mg by mouth 3 (three) times daily as needed.   calcium carbonate 600 MG Tabs tablet Commonly known as: OS-CAL Take 600 mg by mouth 2 (two) times daily with a meal.   ezetimibe 10 MG tablet Commonly known as: ZETIA TAKE 1 TABLET BY MOUTH DAILY   feeding supplement Liqd Take 237 mLs by mouth 2 (two) times daily between meals. What changed: when to take this   fluticasone 50 MCG/ACT nasal spray Commonly known as: FLONASE Place 2 sprays into both nostrils daily as needed for allergies (congestion).   furosemide 20 MG tablet Commonly known as: LASIX Take 20 mg every other day.May take 20 mg every day if swelling worsens   metoprolol tartrate 25 MG tablet Commonly known as: LOPRESSOR TAKE 1 AND 1/2 TABLETS(37.5 MG) BY MOUTH TWICE DAILY   mupirocin ointment 2 % Commonly known as: BACTROBAN Apply 1 Application topically 2 (two) times daily.   nitroGLYCERIN 0.4 MG SL tablet Commonly known as: NITROSTAT For chest pain, tightness, or pressure. While sitting, place 1 tablet under tongue. May be used every 5 minutes as needed, for up to 15 minutes. Do not use more than 3 tablets.   pantoprazole 40 MG tablet Commonly known as: PROTONIX Take 1 tablet (40 mg total) by mouth 2 (two) times daily before a meal.   polyvinyl alcohol 1.4 % ophthalmic solution  Commonly known as: LIQUIFILM TEARS Place 1-2 drops into both eyes 3 (three) times daily as needed for dry eyes.   predniSONE 20 MG tablet Commonly known as: DELTASONE Take 1 tablet (20 mg total) by mouth daily with breakfast for 2 days. Start taking on: May 29, 2023   PreserVision AREDS 2 Caps Take 1 capsule by mouth 2 (two) times daily.   Symbicort 160-4.5 MCG/ACT inhaler Generic drug: budesonide-formoterol Inhale 2 puffs into the lungs in the morning and at bedtime.   Synthroid 125 MCG  tablet Generic drug: levothyroxine Take 125 mcg by mouth daily before breakfast.         Discharge Instructions     Call MD for:  difficulty breathing, headache or visual disturbances   Complete by: As directed    Call MD for:  severe uncontrolled pain   Complete by: As directed    Call MD for:  temperature >100.4   Complete by: As directed    Diet - low sodium heart healthy   Complete by: As directed    Discharge instructions   Complete by: As directed    Follow-up with your primary care provider at the skilled nursing facility in 3 to 5 days.  Check blood work at that time.  Follow-up with infectious disease Clinic on 06/28/2023 at 10 AM.   Increase activity slowly   Complete by: As directed    No wound care   Complete by: As directed          [facility] to provide transportation to facility for patient. Non-emergency ambulance transport at bedside. Handoff completed with [facility] staff/EMTs.   Patient discharged from hospital unit via stretcher. Stable at time of discharge.

## 2023-05-28 NOTE — Progress Notes (Signed)
 AFB Smear/Culture sputum sample collected at this time and sent to lab.

## 2023-05-28 NOTE — Progress Notes (Signed)
 NAME:  [name] [name], MRN:  [id number], DOB:  November 18, 1929, LOS: 0 ADMISSION DATE:  05/23/2023, CONSULTATION DATE:  3/20/225 REFERRING MD:  Dr. Allena Katz - [facility], CHIEF COMPLAINT:  Abnormla CT    History of Present Illness:  [name] [name] is a 89 year old female with a past medical history significant for CKD, CAD s/p DES to LAD, HTN, HLD, osteoarthritis, paroxysmal A-fib on anticoagulation at baseline, s/p TAVR, and anemia who presented to the ED 3/20 via EMS after a mechanical fall with head trauma on anticoagulation.  Patient reports on ambulation to bathroom she became dizzy and fell hitting her head.  Patient reports she has been self-medicating for URI symptoms with Tessalon Perles and over-the-counter cold medications which she attributes to cause of dizziness.  She was also treated with bactrim as an outpatient by PCP. Patient denies loss of consciousness or vision changes post fall.  On ED arrival patient was seen hemodynamically stable with only mild hypertension.  Lab work significant for sodium 129, potassium 5.2, chloride 95, creatinine 1.75, INR within normal at 1.2.  Head and cervical spine CT negative for any acute abnormalities.  CT chest with no acute traumatic injuries but concerning for possible underlying MAC for which PCCM was consulted  Pertinent  Medical History  CKD, CAD s/p DES to LAD, HTN, HLD, osteoarthritis, paroxysmal A-fib on anticoagulation at baseline, s/p TAVR, and anemia  Significant Hospital Events: Including procedures, antibiotic start and stop dates in addition to other pertinent events   3/20 presented after mechanical fall with concern of head trauma on anticoagulation.  No traumatic injuries seen on CT.  Patient was admitted for mild AKI.  PCCM consulted for abnormal chest CT  Interim History / Subjective:  Feeling better today, on RA with sats of 92-94% while I was in the room Coughing up white frothy secretions  Sometimes  thin and sometimes  thick.  Using   flutter valve Pt has decided she would like to go to [facility] for rehab. I am unsure of when she will be discharged.    Objective   Blood pressure (!) 155/66, pulse 66, temperature 97.7 F (36.5 C), temperature source Oral, resp. rate 18, height 5\' 5"  (1.651 m), weight 48 kg, SpO2 92%.        Intake/Output Summary (Last 24 hours) at 05/28/2023 1053 Last data filed at 05/28/2023 1048 Gross per 24 hour  Intake 123 ml  Output 950 ml  Net -827 ml   Filed Weights   05/26/23 0641 05/27/23 0527 05/28/23 0557  Weight: 45.7 kg 47.4 kg 48 kg    Examination: General Pleasant 89 year old female patient she is lying in bed in no acute distress currently, on RA, speaking in full sentences. No distress  HEENT normocephalic atraumatic no jugular venous distention is appreciated, MM pink and dry, No LAD Pulmonary Bilateral chest excursion, some coarse scattered rhonchi no accessory use , + coarse rhonchus minimally productive cough  Cardiac regular rate and rhythm Abdomen soft non tender, ND, BS +, Body mass index is 17.61 kg/m.  Extremities warm dry brisk cap refill, no obvious deformities noted Neuro: Awake oriented x 3, MAE x 4, appropriate  Resolved Hospital Problem list     Assessment & Plan:  RSV bronchiolitis Abnormal CT Chest concerning for MAI infection Bronchiectasis, lower lobe predominant. PAF AKI and hyperkalemia in the setting of bactrim use (improving) Dehydration (hyponatremia with clinical hypovolemia)resolved  Hypothyroidism  H/o HTN H/o CAD HLD  Pulm problem list  RSV  bronchiolitis  Abnormal CT chest w/  focal bronchiectasis, lower lobe predominant. +AFB - no risk factors for TB. Suspect MAC Plan Sputum Cx with moderate normal respiratory flora 3/20 AFB smear +, final culture pending 3/24 AFB smear/culture pending 3/25 AFB smear/culture to be collected prior to discharge  Start ICS/LABA Flutter valve with albuterol and chest physiotherapy. May  benefit from chest vest as an outpatient Mobilize as able with PT/OT, OOB to chair Continue prednisone 20mg  daily for 5 days (day 4 of 5)  Will follow-up with ID as outpatient Will arrange pulmonary follow-up  Care Time: 50 min  Mechele Collin, M.D. [facility] Pulmonary/Critical Care Medicine 05/28/2023 10:53 AM   See Amion for personal pager For hours between 7 PM to 7 AM, please call Elink for urgent questions

## 2023-05-28 NOTE — TOC Transition Note (Signed)
 Transition of Care [facility]) - Discharge Note   Patient Details  Name: [name] [name] MRN: [id number] Date of Birth: [birth year]  [facility] of [facility]) CM/SW Contact:  Delilah Shan, LCSWA Phone Number: 05/28/2023, 10:43 AM   Clinical Narrative:     Patient will DC to: [facility]   Anticipated DC date: 05/28/2023  Family notified: Olegario Messier  Transport by: Sharin Mons  ?  Per MD patient ready for DC to [facility] . RN, patient, patient's family, and facility notified of DC. Discharge Summary sent to facility. RN given number for report [phone] RM#[id number]. DC packet on chart. DNR signed by MD attached to patients DC packet.Ambulance transport requested for patient.  CSW signing off.   Final next level of care: Skilled Nursing Facility Barriers to Discharge: No Barriers Identified   Patient Goals and CMS Choice Patient states their goals for this hospitalization and ongoing recovery are:: SNF   Choice offered to / list presented to : Patient, Sibling      Discharge Placement              Patient chooses bed at: [facility] Patient to be transferred to facility by: [facility] Name of family member notified: Olegario Messier Patient and family notified of of transfer: 05/28/23  Discharge Plan and Services Additional resources added to the After Visit Summary for                                       Social Drivers of Health (SDOH) Interventions SDOH Screenings   Food Insecurity: No Food Insecurity (05/23/2023)  Housing: Low Risk  (05/23/2023)  Transportation Needs: No Transportation Needs (05/23/2023)  Utilities: Not At Risk (05/23/2023)  Alcohol Screen: Low Risk  (01/16/2021)  Depression (PHQ2-9): Low Risk  (01/16/2021)  Financial Resource Strain: Low Risk  (01/16/2021)  Physical Activity: Insufficiently Active (01/16/2021)  Social Connections: Socially Isolated (05/23/2023)  Stress: No Stress Concern Present (01/16/2021)  Tobacco Use: Medium Risk (05/23/2023)      Readmission Risk Interventions     No data to display

## 2023-05-28 NOTE — Telephone Encounter (Signed)
 Please schedule for hospital follow-up at [facility] location in 2-3 months. I will not be available but patient has previously been seen by Drs. Francine Graven and Celine Mans.

## 2023-05-28 NOTE — Discharge Summary (Addendum)
 Physician Discharge Summary  [name] [name]:[id number] DOB: 1929/08/24 DOA: 05/23/2023  PCP: Creola Corn, MD  Admit date: 05/23/2023 Discharge date: 05/28/2023  Admitted From: Home  Discharge disposition: SNF   Recommendations for Outpatient Follow-Up:   Follow up with your primary care provider in one week.  Check CBC, BMP, magnesium in the next visit Follow-up with infectious disease clinic as has been scheduled on 06/28/2023 at 10 AM. Encourage incentive spirometry deep breathing  Discharge Diagnosis:   Principal Problem:   Fall at home, initial encounter Active Problems:   Essential hypertension   CAD in native artery   S/P TAVR (transcatheter aortic valve replacement)   Paroxysmal atrial fibrillation (HCC)   Hypothyroidism   Hypertension   Discharge Condition: Improved.  Diet recommendation: Low sodium, heart healthy.    Wound care: None.  Code status: Full.   History of Present Illness:   [name] [name] is a 89 y.o. female with past medical history of paroxysmal atrial fibrillation tachybradycardia syndrome status post pacemaker implant, CKD stage IIIa, GERD, hypothyroidism, CAD status post stent, aortic valve replacement is status post TAVR on January 29, 2017, essential hypertension, chronic anticoagulation with Eliquis presented to hospital after fall.  Patient stated that she was lightheaded with some double vision when that happened.  In the ED, patient stable.  BMP showed hyponatremia with sodium of 129 with potassium at 5.2.  Creatinine elevated at 1.7.  Urinalysis was negative.  EKG showed normal sinus rhythm. Xray chest negative for any acute cardiopulmonary disease.  CT head scan was negative for acute findings as well including CT C-spine.  Patient was then considered for admission to hospital for further evaluation and treatment.   Hospital Course:   Following conditions were addressed during hospitalization as listed below,  Fall at home:   Likely secondary to volume depletion and recent infection.  PT OT recommends skilled nursing facility placement on discharge. Continue to hold off with diuretic.  At baseline, patient uses a walker for ambulation.  Will be going to skilled nursing facility for ongoing therapy needs.   Volume depletion.  Continue oral hydration.  Improved   Paroxysmal atrial fibrillation: Status post TAVR. Continue Eliquis, amiodarone and metoprolol.  Rate controlled at this time.   Hypothyroidism. Continue Synthroid.   Essential hypertension: Patient is on amlodipine, metoprolol at home.  Blood pressure has improved.  Will be resumed on discharge.   Possible community-acquired pneumonia.  Respiratory syncytial virus infection.   Failed outpatient treatment.  Abnormal CT scan concerning for MAI infection with bronchiectasis.   Sputum culture showing gram-positive cocci.  AFB smear with AFB but no evidence of pulmonary tuberculosis.  Patient has been seen by pulmonary and infectious disease.  No need for isolation.  Patient will follow-up with infectious disease clinic for deciding on further treatment for MAI.  On prednisone and will continue for 2 more days to complete 5-day course.  No leukocytosis on presentation.    Acute kidney injury with hyperkalemia.   Improved at this time.  Creatinine prior to discharge was 1.0.  Disposition.  At this time, patient is stable for disposition to skilled nursing facility with outpatient ID follow-up.  Medical Consultants:   PCCM Infectious disease  Procedures:    None Subjective:   Today, patient was seen and examined at bedside.  Complains of mild cough but no dyspnea fever chills or rigor.  Discharge Exam:   Vitals:   05/27/23 2034 05/28/23 0557  BP: (!) 155/66  Pulse: 73 66  Resp:    Temp: 98 F (36.7 C) 97.7 F (36.5 C)  SpO2:     Vitals:   05/27/23 0914 05/27/23 1150 05/27/23 2034 05/28/23 0557  BP: (!) 121/92 (!) 146/64 (!) 155/66    Pulse: 76 79 73 66  Resp: 19 18    Temp:  97.9 F (36.6 C) 98 F (36.7 C) 97.7 F (36.5 C)  TempSrc:  Oral Oral Oral  SpO2: 92% 92%    Weight:    48 kg  Height:       Body mass index is 17.61 kg/m.  General: Alert awake, not in obvious distress, on room air, elderly, Communicative, appears chronically ill, thinly built HENT: pupils equally reacting to light,  No scleral pallor or icterus noted. Oral mucosa is moist.  Chest: Decreased breath sounds bilaterally, kyphosis, coarse breath sounds noted. CVS: S1 &S2 heard. No murmur.  Regular rate and rhythm. Abdomen: Soft, nontender, nondistended.  Bowel sounds are heard.   Extremities: No cyanosis, clubbing or edema.  Peripheral pulses are palpable. Psych: Alert, awake and oriented, normal mood CNS:  No cranial nerve deficits.  Power equal in all extremities.   Skin: Warm and dry.  Right elbow with abrasions with dressing, bruises over the bilateral lower extremity.  The results of significant diagnostics from this hospitalization (including imaging, microbiology, ancillary and laboratory) are listed below for reference.     Diagnostic Studies:   CT CHEST WO CONTRAST Result Date: 05/23/2023 CLINICAL DATA:  rales.  Fall.  Cough. EXAM: CT CHEST WITHOUT CONTRAST TECHNIQUE: Multidetector CT imaging of the chest was performed following the standard protocol without IV contrast. RADIATION DOSE REDUCTION: This exam was performed according to the departmental dose-optimization program which includes automated exposure control, adjustment of the mA and/or kV according to patient size and/or use of iterative reconstruction technique. COMPARISON:  CT angiography chest from 12/31/2016. FINDINGS: Cardiovascular: Normal cardiac size. No pericardial effusion. No aortic aneurysm. There are coronary artery calcifications, in keeping with coronary artery disease. There are also severe peripheral atherosclerotic vascular calcifications of thoracic aorta and  its major branches. Prosthetic aortic valve noted. Cardiac pacemaker leads noted. Mediastinum/Nodes: Visualized thyroid gland appears grossly unremarkable. No solid / cystic mediastinal masses. The esophagus is nondistended precluding optimal assessment. No mediastinal or axillary lymphadenopathy by size criteria. Evaluation of bilateral hila is limited due to lack on intravenous contrast: however, no large hilar lymphadenopathy identified. Lungs/Pleura: The central tracheo-bronchial tree is patent. There are scattered, patchy small groupings of focal bronchiectasis predominantly in the periphery, with associated focal bronchial wall thickening. There also small centrilobular tree-in-bud configuration nodules in these areas. There is involvement of middle lobe, anterior segment of right upper lobe, inferior lingula and anterior segment of left upper lobe. In appropriate clinical settings findings favor pulmonary mycobacterium avium complex infection. No mass, consolidation, pleural effusion or pneumothorax. Upper Abdomen: Visualized upper abdominal viscera within normal limits. Musculoskeletal: The visualized soft tissues of the chest wall are grossly unremarkable. No suspicious osseous lesions. There are mild multilevel degenerative changes in the visualized spine. No acute rib fracture of vertebral compression fracture. Redemonstration of chronic Schmorl's node in the superior endplate of L1 vertebra, unchanged since the prior study from 2018. IMPRESSION: 1. No acute traumatic injury to the chest. 2. There are scattered, patchy small groupings of focal bronchiectasis predominantly in the periphery, with associated focal bronchial wall thickening. There are also small centrilobular tree-in-bud configuration nodules in these areas. There is involvement of middle  lobe, inferior lingula and anterior segments of bilateral upper lobes. 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 3. Multiple other nonacute observations, as described above. Aortic Atherosclerosis (ICD10-I70.0). Electronically Signed   By: Jules Schick M.D.   On: 05/23/2023 15:21   DG Chest Port 1 View Result Date: 05/23/2023 CLINICAL DATA:  Level 2 trauma, fall, cough EXAM: PORTABLE CHEST 1 VIEW COMPARISON:  07/09/2022 FINDINGS: Stable cardiomediastinal silhouette. Coronary stenting. TAVR. Left chest wall pacemaker. Disconnected right chest wall pacer aids. Aortic atherosclerotic calcification. Hyperinflation and chronic bronchitic changes. Chronic blunting of the costophrenic angles. No focal consolidation or pneumothorax. No displaced rib fractures. IMPRESSION: No acute cardiopulmonary disease. COPD. Electronically Signed   By: Minerva Fester M.D.   On: 05/23/2023 00:44   CT HEAD WO CONTRAST Result Date: 05/23/2023 CLINICAL DATA:  Moderate-severe head trauma.  Blunt polytrauma. EXAM: CT HEAD WITHOUT CONTRAST CT CERVICAL SPINE WITHOUT CONTRAST TECHNIQUE: Multidetector CT imaging of the head and cervical spine was performed following the standard protocol without intravenous contrast. Multiplanar CT image reconstructions of the cervical spine were also generated. RADIATION DOSE REDUCTION: This exam was performed according to the departmental dose-optimization program which includes automated exposure control, adjustment of the mA and/or kV according to patient size and/or use of iterative reconstruction technique. COMPARISON:  CT head 09/12/2022 and cervical spine CT 12/17/2020 FINDINGS: CT HEAD FINDINGS Brain: No intracranial hemorrhage, mass effect, or evidence of acute infarct. No hydrocephalus. No extra-axial fluid collection. Age related cerebral atrophy and chronic small vessel ischemic disease. Vascular: No hyperdense vessel. Intracranial arterial calcification. Skull: No fracture or focal lesion. Sinuses/Orbits: No acute finding. Other: None. CT CERVICAL SPINE FINDINGS  Alignment: No evidence of traumatic malalignment. Skull base and vertebrae: No acute fracture. No primary bone lesion or focal pathologic process. Soft tissues and spinal canal: No prevertebral fluid or swelling. No visible canal hematoma. Disc levels: Multilevel spondylosis and facet arthropathy is not significantly changed from 12/17/2020. No severe spinal canal narrowing. Upper chest: No acute abnormality. Other: Carotid calcification. IMPRESSION: 1. No acute intracranial abnormality. 2. No acute fracture in the cervical spine. 3. Chronic sphenoid sinusitis. Electronically Signed   By: Minerva Fester M.D.   On: 05/23/2023 00:42   CT CERVICAL SPINE WO CONTRAST Result Date: 05/23/2023 CLINICAL DATA:  Moderate-severe head trauma.  Blunt polytrauma. EXAM: CT HEAD WITHOUT CONTRAST CT CERVICAL SPINE WITHOUT CONTRAST TECHNIQUE: Multidetector CT imaging of the head and cervical spine was performed following the standard protocol without intravenous contrast. Multiplanar CT image reconstructions of the cervical spine were also generated. RADIATION DOSE REDUCTION: This exam was performed according to the departmental dose-optimization program which includes automated exposure control, adjustment of the mA and/or kV according to patient size and/or use of iterative reconstruction technique. COMPARISON:  CT head 09/12/2022 and cervical spine CT 12/17/2020 FINDINGS: CT HEAD FINDINGS Brain: No intracranial hemorrhage, mass effect, or evidence of acute infarct. No hydrocephalus. No extra-axial fluid collection. Age related cerebral atrophy and chronic small vessel ischemic disease. Vascular: No hyperdense vessel. Intracranial arterial calcification. Skull: No fracture or focal lesion. Sinuses/Orbits: No acute finding. Other: None. CT CERVICAL SPINE FINDINGS Alignment: No evidence of traumatic malalignment. Skull base and vertebrae: No acute fracture. No primary bone lesion or focal pathologic process. Soft tissues and  spinal canal: No prevertebral fluid or swelling. No visible canal hematoma. Disc levels: Multilevel spondylosis and facet arthropathy is not significantly changed from 12/17/2020. No severe spinal canal narrowing. Upper chest: No  acute abnormality. Other: Carotid calcification. IMPRESSION: 1. No acute intracranial abnormality. 2. No acute fracture in the cervical spine. 3. Chronic sphenoid sinusitis. Electronically Signed   By: Minerva Fester M.D.   On: 05/23/2023 00:42     Labs:   Basic Metabolic Panel: Recent Labs  Lab 05/23/23 0006 05/23/23 0027 05/24/23 1337 05/25/23 0349 05/26/23 0405  NA 129* 127* 135 133* 134*  K 5.2* 5.2* 4.6 4.7 4.5  CL 95* 98 101 106 104  CO2 22  --  21* 24 20*  GLUCOSE 97 91 126* 99 112*  BUN 34* 33* 16 14 15   CREATININE 1.76* 2.00* 1.22* 1.05* 1.01*  CALCIUM 9.8  --  9.3 8.7* 9.4  MG  --   --  1.9 1.9 1.9  PHOS  --   --   --  2.8  --    GFR Estimated Creatinine Clearance: 27.5 mL/min (A) (by C-G formula based on SCr of 1.01 mg/dL (H)). Liver Function Tests: Recent Labs  Lab 05/23/23 0006  AST 31  ALT 20  ALKPHOS 65  BILITOT 0.9  PROT 6.3*  ALBUMIN 3.8   No results for input(s): "LIPASE", "AMYLASE" in the last 168 hours. No results for input(s): "AMMONIA" in the last 168 hours. Coagulation profile Recent Labs  Lab 05/23/23 0006  INR 1.2    CBC: Recent Labs  Lab 05/23/23 0006 05/23/23 0027 05/24/23 1337 05/25/23 0349 05/26/23 0405  WBC 5.3  --  6.8 6.0 6.2  HGB 14.1 14.3 14.2 14.1 14.2  HCT 41.5 42.0 41.9 41.3 41.3  MCV 102.0*  --  100.0 99.8 99.5  PLT 154  --  153 140* 144*   Cardiac Enzymes: No results for input(s): "CKTOTAL", "CKMB", "CKMBINDEX", "TROPONINI" in the last 168 hours. BNP: Invalid input(s): "POCBNP" CBG: Recent Labs  Lab 05/25/23 0520 05/25/23 0806 05/26/23 0513 05/27/23 0642 05/28/23 0605  GLUCAP 95 122* 93 95 85   D-Dimer No results for input(s): "DDIMER" in the last 72 hours. Hgb A1c No  results for input(s): "HGBA1C" in the last 72 hours. Lipid Profile No results for input(s): "CHOL", "HDL", "LDLCALC", "TRIG", "CHOLHDL", "LDLDIRECT" in the last 72 hours. Thyroid function studies No results for input(s): "TSH", "T4TOTAL", "T3FREE", "THYROIDAB" in the last 72 hours.  Invalid input(s): "FREET3" Anemia work up No results for input(s): "VITAMINB12", "FOLATE", "FERRITIN", "TIBC", "IRON", "RETICCTPCT" in the last 72 hours. Microbiology Recent Results (from the past 240 hours)  Acid Fast Smear (AFB)     Status: None   Collection Time: 05/23/23  5:13 PM   Specimen: Sputum  Result Value Ref Range Status   AFB Specimen Processing Concentration  Final   Acid Fast Smear Positive  Final    Comment: CRITICAL RESULT CALLED TO, READ BACK BY AND VERIFIED WITH: K. DUFFY RN, AT 1500 05/27/23 D. VANHOOK (NOTE) 1+, 4-36 acid-fast bacilli per 100 fields at 400X magnification, fluorescent smear REPORTED RESULT TO [facility] VANKOOK  05-27-23 AT 1440. FAX CONFIRMTION TO [phone].CEM Performed At: [facility] [address] [place], [place] [id number] Jolene Schimke MD QM:[id number]    Source (AFB) SPUTUM  Final    Comment: Performed at [facility], [address], [place], [place] [address]  Expectorated Sputum Assessment w Gram Stain, Rflx to Resp Cult     Status: None   Collection Time: 05/23/23  6:28 PM   Specimen: Expectorated Sputum  Result Value Ref Range Status   Specimen Description EXPECTORATED SPUTUM  Final   Special Requests NONE  Final   Sputum evaluation   Final    THIS SPECIMEN IS ACCEPTABLE FOR SPUTUM CULTURE Performed at [facility], [address], [place], [place] [address]    Report Status 05/24/2023 FINAL  Final  Culture, Respiratory w Gram Stain     Status: None   Collection Time: 05/23/23  6:28 PM  Result Value Ref Range Status   Specimen Description EXPECTORATED SPUTUM  Final   Special Requests NONE Reflexed from [id number]  Final    Gram Stain   Final    FEW SQUAMOUS EPITHELIAL CELLS PRESENT WBC PRESENT, PREDOMINANTLY PMN ABUNDANT GRAM POSITIVE COCCI RARE GRAM NEGATIVE RODS    Culture   Final    MODERATE Normal respiratory flora-no Staph aureus or Pseudomonas seen Performed at [facility], [address], [place], [place] [address]    Report Status 05/26/2023 FINAL  Final  Respiratory (~20 pathogens) panel by PCR     Status: Abnormal   Collection Time: 05/23/23  6:31 PM   Specimen: Nasopharyngeal Swab; Respiratory  Result Value Ref Range Status   Adenovirus NOT DETECTED NOT DETECTED Final   Coronavirus 229E NOT DETECTED NOT DETECTED Final    Comment: (NOTE) The Coronavirus on the Respiratory Panel, DOES NOT test for the novel  Coronavirus (2019 nCoV)    Coronavirus HKU1 NOT DETECTED NOT DETECTED Final   Coronavirus NL63 NOT DETECTED NOT DETECTED Final   Coronavirus OC43 NOT DETECTED NOT DETECTED Final   Metapneumovirus NOT DETECTED NOT DETECTED Final   Rhinovirus / Enterovirus NOT DETECTED NOT DETECTED Final   Influenza A NOT DETECTED NOT DETECTED Final   Influenza B NOT DETECTED NOT DETECTED Final   Parainfluenza Virus 1 NOT DETECTED NOT DETECTED Final   Parainfluenza Virus 2 NOT DETECTED NOT DETECTED Final   Parainfluenza Virus 3 NOT DETECTED NOT DETECTED Final   Parainfluenza Virus 4 NOT DETECTED NOT DETECTED Final   Respiratory Syncytial Virus DETECTED (A) NOT DETECTED Final   Bordetella pertussis NOT DETECTED NOT DETECTED Final   Bordetella Parapertussis NOT DETECTED NOT DETECTED Final   Chlamydophila pneumoniae NOT DETECTED NOT DETECTED Final   Mycoplasma pneumoniae NOT DETECTED NOT DETECTED Final    Comment: Performed at [facility], [address], [place], [place] [address]     Discharge Instructions:   Discharge Instructions     Call MD for:  difficulty breathing, headache or visual disturbances   Complete by: As directed    Call MD for:  severe uncontrolled pain   Complete  by: As directed    Call MD for:  temperature >100.4   Complete by: As directed    Diet - low sodium heart healthy   Complete by: As directed    Discharge instructions   Complete by: As directed    Follow-up with your primary care provider at the skilled nursing facility in 3 to 5 days.  Check blood work at that time.  Follow-up with infectious disease Clinic on 06/28/2023 at 10 AM.   Increase activity slowly   Complete by: As directed    No wound care   Complete by: As directed       Allergies as of 05/28/2023       Reactions   Penicillins Anaphylaxis, Other (See Comments)   Has patient had a PCN reaction causing immediate rash, facial/tongue/throat swelling, SOB or lightheadedness with hypotension: Yes Has patient had a PCN reaction causing severe rash involving mucus membranes or skin necrosis: No Has patient  had a PCN reaction that required hospitalization: No Has patient had a PCN reaction occurring within the last 10 years: No If all of the above answers are "NO", then may proceed with Cephalosporin use.   Tetanus Toxoid Anaphylaxis   Demerol Nausea Only   Severe nausea   Clindamycin/lincomycin    Sores in mouth   Letrozole Other (See Comments)   Meningococcal Poly Tetanus Conj Vaccine Acwy Other (See Comments)   Moxifloxacin Hcl Other (See Comments)   Codeine Nausea Only   Severe nausea        Medication List     STOP taking these medications    sulfamethoxazole-trimethoprim 800-160 MG tablet Commonly known as: BACTRIM DS       TAKE these medications    albuterol 108 (90 Base) MCG/ACT inhaler Commonly known as: VENTOLIN HFA Inhale 1 puff into the lungs every 6 (six) hours as needed for shortness of breath or wheezing.   amiodarone 200 MG tablet Commonly known as: PACERONE TAKE 1/2 TABLET(100 MG) BY MOUTH DAILY   amLODipine 5 MG tablet Commonly known as: NORVASC Take 0.5 tablets (2.5 mg total) by mouth daily. TAKE 1 TABLET(5 MG) BY MOUTH DAILY    apixaban 2.5 MG Tabs tablet Commonly known as: Eliquis TAKE 1 TABLET(2.5 MG) BY MOUTH TWICE DAILY   benzonatate 200 MG capsule Commonly known as: TESSALON Take 200 mg by mouth 3 (three) times daily as needed.   calcium carbonate 600 MG Tabs tablet Commonly known as: OS-CAL Take 600 mg by mouth 2 (two) times daily with a meal.   ezetimibe 10 MG tablet Commonly known as: ZETIA TAKE 1 TABLET BY MOUTH DAILY   feeding supplement Liqd Take 237 mLs by mouth 2 (two) times daily between meals. What changed: when to take this   fluticasone 50 MCG/ACT nasal spray Commonly known as: FLONASE Place 2 sprays into both nostrils daily as needed for allergies (congestion).   furosemide 20 MG tablet Commonly known as: LASIX Take 20 mg every other day.May take 20 mg every day if swelling worsens   metoprolol tartrate 25 MG tablet Commonly known as: LOPRESSOR TAKE 1 AND 1/2 TABLETS(37.5 MG) BY MOUTH TWICE DAILY   mupirocin ointment 2 % Commonly known as: BACTROBAN Apply 1 Application topically 2 (two) times daily.   nitroGLYCERIN 0.4 MG SL tablet Commonly known as: NITROSTAT For chest pain, tightness, or pressure. While sitting, place 1 tablet under tongue. May be used every 5 minutes as needed, for up to 15 minutes. Do not use more than 3 tablets.   pantoprazole 40 MG tablet Commonly known as: PROTONIX Take 1 tablet (40 mg total) by mouth 2 (two) times daily before a meal.   polyvinyl alcohol 1.4 % ophthalmic solution Commonly known as: LIQUIFILM TEARS Place 1-2 drops into both eyes 3 (three) times daily as needed for dry eyes.   predniSONE 20 MG tablet Commonly known as: DELTASONE Take 1 tablet (20 mg total) by mouth daily with breakfast for 2 days. Start taking on: May 29, 2023   PreserVision AREDS 2 Caps Take 1 capsule by mouth 2 (two) times daily.   Synthroid 125 MCG tablet Generic drug: levothyroxine Take 125 mcg by mouth daily before breakfast.        Follow-up  Information     Odette Fraction, MD Follow up on 06/28/2023.   Specialty: Infectious Diseases Why: 4/25 @ 10:00 with Dr. Elinor Parkinson, Hospital Discharge Follow Up Contact information: [address] [employer] [address] [place] [address]  [phone]         Creola Corn, MD .   Specialty: Internal Medicine Contact information: [address] [place] [address] [phone]                  Time coordinating discharge: 39 minutes  Signed:  Koral Thaden  [facility] 05/28/2023, 9:38 AM

## 2023-05-28 NOTE — TOC Progression Note (Addendum)
 Transition of Care [facility]) - Progression Note    Patient Details  Name: [name] [name] MRN: [id number] Date of Birth: Jun 30, 1929  [facility] CM/SW Contact  Delilah Shan, LCSWA Phone Number: 05/28/2023, 8:47 AM  Clinical Narrative:     Patients insurance authorization has been approved. Plan Auth ID# [id number]. Auth ID# [id number]. Insurance authorization has been approved from 3/24-3/26. Patient has SNF bed at [facility]. CSW spoke with Kia who confirmed patient can dc over today if medically stable. CSW informed MD. Update- CSW informed by RN that patient now wanting to switch SNF facility choice to [facility]. CSW spoke with Brayton El with [facility] to see if [facility] able to offer patient SNF bed today. Britney informed CSW that she will give CSW a call back to confirm.  Update- CSW received call back from [place] with [facility] who confirmed they can offer SNF bed for patient. CSW called patients insurance and informed patients insurance of facility choice change. Patients insurance changed facility to [facility]. Insurance authorization approved.   Expected Discharge Plan: Home w Home Health Services Barriers to Discharge: Continued Medical Work up  Expected Discharge Plan and Services       Living arrangements for the past 2 months: Single Family Home (Lives in a townhouse with stairs and has a chair lift for using the stairs.)                                       Social Determinants of Health (SDOH) Interventions SDOH Screenings   Food Insecurity: No Food Insecurity (05/23/2023)  Housing: Low Risk  (05/23/2023)  Transportation Needs: No Transportation Needs (05/23/2023)  Utilities: Not At Risk (05/23/2023)  Alcohol Screen: Low Risk  (01/16/2021)  Depression (PHQ2-9): Low Risk  (01/16/2021)  Financial Resource Strain: Low Risk  (01/16/2021)  Physical Activity: Insufficiently Active (01/16/2021)  Social Connections: Socially Isolated (05/23/2023)  Stress: No  Stress Concern Present (01/16/2021)  Tobacco Use: Medium Risk (05/23/2023)    Readmission Risk Interventions     No data to display

## 2023-05-29 LAB — ACID FAST SMEAR (AFB, MYCOBACTERIA)
Acid Fast Smear: NEGATIVE
Acid Fast Smear: NEGATIVE

## 2023-05-30 DIAGNOSIS — E785 Hyperlipidemia, unspecified: Secondary | ICD-10-CM | POA: Diagnosis not present

## 2023-05-30 DIAGNOSIS — I10 Essential (primary) hypertension: Secondary | ICD-10-CM | POA: Diagnosis not present

## 2023-05-30 DIAGNOSIS — E039 Hypothyroidism, unspecified: Secondary | ICD-10-CM | POA: Diagnosis not present

## 2023-05-30 DIAGNOSIS — I4819 Other persistent atrial fibrillation: Secondary | ICD-10-CM | POA: Diagnosis not present

## 2023-06-04 DIAGNOSIS — J219 Acute bronchiolitis, unspecified: Secondary | ICD-10-CM | POA: Diagnosis not present

## 2023-06-04 DIAGNOSIS — G4709 Other insomnia: Secondary | ICD-10-CM | POA: Diagnosis not present

## 2023-06-06 DIAGNOSIS — S51011D Laceration without foreign body of right elbow, subsequent encounter: Secondary | ICD-10-CM | POA: Diagnosis not present

## 2023-06-06 DIAGNOSIS — E785 Hyperlipidemia, unspecified: Secondary | ICD-10-CM | POA: Diagnosis not present

## 2023-06-06 DIAGNOSIS — E039 Hypothyroidism, unspecified: Secondary | ICD-10-CM | POA: Diagnosis not present

## 2023-06-06 DIAGNOSIS — I10 Essential (primary) hypertension: Secondary | ICD-10-CM | POA: Diagnosis not present

## 2023-06-07 ENCOUNTER — Telehealth: Payer: Self-pay | Admitting: *Deleted

## 2023-06-07 ENCOUNTER — Inpatient Hospital Stay: Payer: Medicare PPO | Attending: Hematology and Oncology | Admitting: Adult Health

## 2023-06-12 ENCOUNTER — Other Ambulatory Visit: Payer: Self-pay | Admitting: Cardiology

## 2023-06-17 LAB — ACID FAST CULTURE WITH REFLEXED SENSITIVITIES (MYCOBACTERIA): Acid Fast Culture: POSITIVE — AB

## 2023-06-17 LAB — ACID FAST ID BY PCR AND SUSCEPTIBILITIES
M Tuberculosis Complex: NEGATIVE
M avium complex: POSITIVE — AB

## 2023-06-18 DIAGNOSIS — J069 Acute upper respiratory infection, unspecified: Secondary | ICD-10-CM | POA: Diagnosis not present

## 2023-06-18 DIAGNOSIS — T7840XA Allergy, unspecified, initial encounter: Secondary | ICD-10-CM | POA: Diagnosis not present

## 2023-06-18 DIAGNOSIS — Z7189 Other specified counseling: Secondary | ICD-10-CM | POA: Diagnosis not present

## 2023-06-18 DIAGNOSIS — R509 Fever, unspecified: Secondary | ICD-10-CM | POA: Diagnosis not present

## 2023-06-18 DIAGNOSIS — R5383 Other fatigue: Secondary | ICD-10-CM | POA: Diagnosis not present

## 2023-06-19 DIAGNOSIS — I480 Paroxysmal atrial fibrillation: Secondary | ICD-10-CM | POA: Diagnosis not present

## 2023-06-19 DIAGNOSIS — K219 Gastro-esophageal reflux disease without esophagitis: Secondary | ICD-10-CM | POA: Diagnosis not present

## 2023-06-19 DIAGNOSIS — I130 Hypertensive heart and chronic kidney disease with heart failure and stage 1 through stage 4 chronic kidney disease, or unspecified chronic kidney disease: Secondary | ICD-10-CM | POA: Diagnosis not present

## 2023-06-19 DIAGNOSIS — I2510 Atherosclerotic heart disease of native coronary artery without angina pectoris: Secondary | ICD-10-CM | POA: Diagnosis not present

## 2023-06-19 DIAGNOSIS — I509 Heart failure, unspecified: Secondary | ICD-10-CM | POA: Diagnosis not present

## 2023-06-19 DIAGNOSIS — I495 Sick sinus syndrome: Secondary | ICD-10-CM | POA: Diagnosis not present

## 2023-06-19 DIAGNOSIS — J210 Acute bronchiolitis due to respiratory syncytial virus: Secondary | ICD-10-CM | POA: Diagnosis not present

## 2023-06-19 DIAGNOSIS — E039 Hypothyroidism, unspecified: Secondary | ICD-10-CM | POA: Diagnosis not present

## 2023-06-19 DIAGNOSIS — N1831 Chronic kidney disease, stage 3a: Secondary | ICD-10-CM | POA: Diagnosis not present

## 2023-06-21 DIAGNOSIS — N1831 Chronic kidney disease, stage 3a: Secondary | ICD-10-CM | POA: Diagnosis not present

## 2023-06-21 DIAGNOSIS — I130 Hypertensive heart and chronic kidney disease with heart failure and stage 1 through stage 4 chronic kidney disease, or unspecified chronic kidney disease: Secondary | ICD-10-CM | POA: Diagnosis not present

## 2023-06-21 DIAGNOSIS — I495 Sick sinus syndrome: Secondary | ICD-10-CM | POA: Diagnosis not present

## 2023-06-21 DIAGNOSIS — J210 Acute bronchiolitis due to respiratory syncytial virus: Secondary | ICD-10-CM | POA: Diagnosis not present

## 2023-06-21 DIAGNOSIS — I2510 Atherosclerotic heart disease of native coronary artery without angina pectoris: Secondary | ICD-10-CM | POA: Diagnosis not present

## 2023-06-21 DIAGNOSIS — K219 Gastro-esophageal reflux disease without esophagitis: Secondary | ICD-10-CM | POA: Diagnosis not present

## 2023-06-21 DIAGNOSIS — I480 Paroxysmal atrial fibrillation: Secondary | ICD-10-CM | POA: Diagnosis not present

## 2023-06-21 DIAGNOSIS — I509 Heart failure, unspecified: Secondary | ICD-10-CM | POA: Diagnosis not present

## 2023-06-21 DIAGNOSIS — E039 Hypothyroidism, unspecified: Secondary | ICD-10-CM | POA: Diagnosis not present

## 2023-06-21 LAB — MAC SUSCEPTIBILITY BROTH
Ciprofloxacin: >8
Clarithromycin: 2
Doxycycline: >8
Linezolid: 32
Minocycline: >8
Moxifloxacin: 4
Rifabutin: 1
Rifampin: >4
Streptomycin: >32

## 2023-06-21 LAB — ACID FAST CULTURE WITH REFLEXED SENSITIVITIES (MYCOBACTERIA): Acid Fast Culture: POSITIVE — AB

## 2023-06-21 LAB — ACID FAST ID BY PCR AND SUSCEPTIBILITIES
M Tuberculosis Complex: NEGATIVE
M avium complex: POSITIVE — AB

## 2023-06-24 DIAGNOSIS — I495 Sick sinus syndrome: Secondary | ICD-10-CM | POA: Diagnosis not present

## 2023-06-24 DIAGNOSIS — E039 Hypothyroidism, unspecified: Secondary | ICD-10-CM | POA: Diagnosis not present

## 2023-06-24 DIAGNOSIS — N1831 Chronic kidney disease, stage 3a: Secondary | ICD-10-CM | POA: Diagnosis not present

## 2023-06-24 DIAGNOSIS — I509 Heart failure, unspecified: Secondary | ICD-10-CM | POA: Diagnosis not present

## 2023-06-24 DIAGNOSIS — I130 Hypertensive heart and chronic kidney disease with heart failure and stage 1 through stage 4 chronic kidney disease, or unspecified chronic kidney disease: Secondary | ICD-10-CM | POA: Diagnosis not present

## 2023-06-24 DIAGNOSIS — J210 Acute bronchiolitis due to respiratory syncytial virus: Secondary | ICD-10-CM | POA: Diagnosis not present

## 2023-06-24 DIAGNOSIS — I2510 Atherosclerotic heart disease of native coronary artery without angina pectoris: Secondary | ICD-10-CM | POA: Diagnosis not present

## 2023-06-24 DIAGNOSIS — I480 Paroxysmal atrial fibrillation: Secondary | ICD-10-CM | POA: Diagnosis not present

## 2023-06-25 ENCOUNTER — Ambulatory Visit: Payer: Medicare PPO

## 2023-06-25 DIAGNOSIS — R6883 Chills (without fever): Secondary | ICD-10-CM | POA: Diagnosis not present

## 2023-06-25 DIAGNOSIS — N3000 Acute cystitis without hematuria: Secondary | ICD-10-CM | POA: Diagnosis not present

## 2023-06-25 DIAGNOSIS — Z7189 Other specified counseling: Secondary | ICD-10-CM | POA: Diagnosis not present

## 2023-06-25 DIAGNOSIS — R109 Unspecified abdominal pain: Secondary | ICD-10-CM | POA: Diagnosis not present

## 2023-06-25 LAB — ACID FAST CULTURE WITH REFLEXED SENSITIVITIES (MYCOBACTERIA): Acid Fast Culture: POSITIVE — AB

## 2023-06-25 LAB — ACID FAST ID BY PCR AND SUSCEPTIBILITIES
M Tuberculosis Complex: NEGATIVE
M avium complex: POSITIVE — AB

## 2023-06-28 ENCOUNTER — Inpatient Hospital Stay: Payer: Self-pay | Admitting: Infectious Diseases

## 2023-07-02 NOTE — Telephone Encounter (Signed)
 No entry

## 2023-07-05 DIAGNOSIS — I2510 Atherosclerotic heart disease of native coronary artery without angina pectoris: Secondary | ICD-10-CM | POA: Diagnosis not present

## 2023-07-05 DIAGNOSIS — K219 Gastro-esophageal reflux disease without esophagitis: Secondary | ICD-10-CM | POA: Diagnosis not present

## 2023-07-05 DIAGNOSIS — I480 Paroxysmal atrial fibrillation: Secondary | ICD-10-CM | POA: Diagnosis not present

## 2023-07-05 DIAGNOSIS — E039 Hypothyroidism, unspecified: Secondary | ICD-10-CM | POA: Diagnosis not present

## 2023-07-05 DIAGNOSIS — N1831 Chronic kidney disease, stage 3a: Secondary | ICD-10-CM | POA: Diagnosis not present

## 2023-07-05 DIAGNOSIS — I130 Hypertensive heart and chronic kidney disease with heart failure and stage 1 through stage 4 chronic kidney disease, or unspecified chronic kidney disease: Secondary | ICD-10-CM | POA: Diagnosis not present

## 2023-07-05 DIAGNOSIS — J210 Acute bronchiolitis due to respiratory syncytial virus: Secondary | ICD-10-CM | POA: Diagnosis not present

## 2023-07-05 DIAGNOSIS — I495 Sick sinus syndrome: Secondary | ICD-10-CM | POA: Diagnosis not present

## 2023-07-05 DIAGNOSIS — I509 Heart failure, unspecified: Secondary | ICD-10-CM | POA: Diagnosis not present

## 2023-07-11 DIAGNOSIS — G319 Degenerative disease of nervous system, unspecified: Secondary | ICD-10-CM | POA: Diagnosis not present

## 2023-07-11 DIAGNOSIS — E039 Hypothyroidism, unspecified: Secondary | ICD-10-CM | POA: Diagnosis not present

## 2023-07-11 DIAGNOSIS — N3281 Overactive bladder: Secondary | ICD-10-CM | POA: Diagnosis not present

## 2023-07-11 DIAGNOSIS — I2510 Atherosclerotic heart disease of native coronary artery without angina pectoris: Secondary | ICD-10-CM | POA: Diagnosis not present

## 2023-07-11 DIAGNOSIS — I480 Paroxysmal atrial fibrillation: Secondary | ICD-10-CM | POA: Diagnosis not present

## 2023-07-11 DIAGNOSIS — I700 Atherosclerosis of aorta: Secondary | ICD-10-CM | POA: Diagnosis not present

## 2023-07-17 ENCOUNTER — Ambulatory Visit: Payer: Medicare PPO

## 2023-07-18 DIAGNOSIS — I482 Chronic atrial fibrillation, unspecified: Secondary | ICD-10-CM | POA: Diagnosis not present

## 2023-07-18 DIAGNOSIS — M1612 Unilateral primary osteoarthritis, left hip: Secondary | ICD-10-CM | POA: Diagnosis not present

## 2023-07-18 DIAGNOSIS — R262 Difficulty in walking, not elsewhere classified: Secondary | ICD-10-CM | POA: Diagnosis not present

## 2023-07-19 DIAGNOSIS — I2510 Atherosclerotic heart disease of native coronary artery without angina pectoris: Secondary | ICD-10-CM | POA: Diagnosis not present

## 2023-07-24 DIAGNOSIS — M1612 Unilateral primary osteoarthritis, left hip: Secondary | ICD-10-CM | POA: Diagnosis not present

## 2023-07-24 DIAGNOSIS — I482 Chronic atrial fibrillation, unspecified: Secondary | ICD-10-CM | POA: Diagnosis not present

## 2023-07-24 DIAGNOSIS — R262 Difficulty in walking, not elsewhere classified: Secondary | ICD-10-CM | POA: Diagnosis not present

## 2023-07-29 DIAGNOSIS — I482 Chronic atrial fibrillation, unspecified: Secondary | ICD-10-CM | POA: Diagnosis not present

## 2023-07-29 DIAGNOSIS — M1612 Unilateral primary osteoarthritis, left hip: Secondary | ICD-10-CM | POA: Diagnosis not present

## 2023-07-29 DIAGNOSIS — R262 Difficulty in walking, not elsewhere classified: Secondary | ICD-10-CM | POA: Diagnosis not present

## 2023-07-30 DIAGNOSIS — M1612 Unilateral primary osteoarthritis, left hip: Secondary | ICD-10-CM | POA: Diagnosis not present

## 2023-07-30 DIAGNOSIS — I482 Chronic atrial fibrillation, unspecified: Secondary | ICD-10-CM | POA: Diagnosis not present

## 2023-07-30 DIAGNOSIS — R262 Difficulty in walking, not elsewhere classified: Secondary | ICD-10-CM | POA: Diagnosis not present

## 2023-08-02 DIAGNOSIS — M1612 Unilateral primary osteoarthritis, left hip: Secondary | ICD-10-CM | POA: Diagnosis not present

## 2023-08-02 DIAGNOSIS — I482 Chronic atrial fibrillation, unspecified: Secondary | ICD-10-CM | POA: Diagnosis not present

## 2023-08-02 DIAGNOSIS — R262 Difficulty in walking, not elsewhere classified: Secondary | ICD-10-CM | POA: Diagnosis not present

## 2023-08-05 DIAGNOSIS — R262 Difficulty in walking, not elsewhere classified: Secondary | ICD-10-CM | POA: Diagnosis not present

## 2023-08-05 DIAGNOSIS — E782 Mixed hyperlipidemia: Secondary | ICD-10-CM | POA: Diagnosis not present

## 2023-08-05 DIAGNOSIS — I480 Paroxysmal atrial fibrillation: Secondary | ICD-10-CM | POA: Diagnosis not present

## 2023-08-05 DIAGNOSIS — R059 Cough, unspecified: Secondary | ICD-10-CM | POA: Diagnosis not present

## 2023-08-05 DIAGNOSIS — G4700 Insomnia, unspecified: Secondary | ICD-10-CM | POA: Diagnosis not present

## 2023-08-05 DIAGNOSIS — E039 Hypothyroidism, unspecified: Secondary | ICD-10-CM | POA: Diagnosis not present

## 2023-08-05 DIAGNOSIS — I482 Chronic atrial fibrillation, unspecified: Secondary | ICD-10-CM | POA: Diagnosis not present

## 2023-08-05 DIAGNOSIS — M1612 Unilateral primary osteoarthritis, left hip: Secondary | ICD-10-CM | POA: Diagnosis not present

## 2023-08-07 DIAGNOSIS — R262 Difficulty in walking, not elsewhere classified: Secondary | ICD-10-CM | POA: Diagnosis not present

## 2023-08-07 DIAGNOSIS — M1612 Unilateral primary osteoarthritis, left hip: Secondary | ICD-10-CM | POA: Diagnosis not present

## 2023-08-07 DIAGNOSIS — I482 Chronic atrial fibrillation, unspecified: Secondary | ICD-10-CM | POA: Diagnosis not present

## 2023-08-08 DIAGNOSIS — R059 Cough, unspecified: Secondary | ICD-10-CM | POA: Diagnosis not present

## 2023-08-08 DIAGNOSIS — I10 Essential (primary) hypertension: Secondary | ICD-10-CM | POA: Diagnosis not present

## 2023-08-08 DIAGNOSIS — E039 Hypothyroidism, unspecified: Secondary | ICD-10-CM | POA: Diagnosis not present

## 2023-08-09 DIAGNOSIS — I482 Chronic atrial fibrillation, unspecified: Secondary | ICD-10-CM | POA: Diagnosis not present

## 2023-08-09 DIAGNOSIS — M1612 Unilateral primary osteoarthritis, left hip: Secondary | ICD-10-CM | POA: Diagnosis not present

## 2023-08-09 DIAGNOSIS — R262 Difficulty in walking, not elsewhere classified: Secondary | ICD-10-CM | POA: Diagnosis not present

## 2023-08-11 DIAGNOSIS — I482 Chronic atrial fibrillation, unspecified: Secondary | ICD-10-CM | POA: Diagnosis not present

## 2023-08-11 DIAGNOSIS — M1612 Unilateral primary osteoarthritis, left hip: Secondary | ICD-10-CM | POA: Diagnosis not present

## 2023-08-11 DIAGNOSIS — R262 Difficulty in walking, not elsewhere classified: Secondary | ICD-10-CM | POA: Diagnosis not present

## 2023-08-13 DIAGNOSIS — I482 Chronic atrial fibrillation, unspecified: Secondary | ICD-10-CM | POA: Diagnosis not present

## 2023-08-13 DIAGNOSIS — M1612 Unilateral primary osteoarthritis, left hip: Secondary | ICD-10-CM | POA: Diagnosis not present

## 2023-08-13 DIAGNOSIS — R262 Difficulty in walking, not elsewhere classified: Secondary | ICD-10-CM | POA: Diagnosis not present

## 2023-08-14 DIAGNOSIS — H35033 Hypertensive retinopathy, bilateral: Secondary | ICD-10-CM | POA: Diagnosis not present

## 2023-08-14 DIAGNOSIS — H353111 Nonexudative age-related macular degeneration, right eye, early dry stage: Secondary | ICD-10-CM | POA: Diagnosis not present

## 2023-08-14 DIAGNOSIS — H353122 Nonexudative age-related macular degeneration, left eye, intermediate dry stage: Secondary | ICD-10-CM | POA: Diagnosis not present

## 2023-08-14 DIAGNOSIS — H524 Presbyopia: Secondary | ICD-10-CM | POA: Diagnosis not present

## 2023-08-20 DIAGNOSIS — M1612 Unilateral primary osteoarthritis, left hip: Secondary | ICD-10-CM | POA: Diagnosis not present

## 2023-08-20 DIAGNOSIS — I482 Chronic atrial fibrillation, unspecified: Secondary | ICD-10-CM | POA: Diagnosis not present

## 2023-08-20 DIAGNOSIS — R262 Difficulty in walking, not elsewhere classified: Secondary | ICD-10-CM | POA: Diagnosis not present

## 2023-08-22 DIAGNOSIS — I480 Paroxysmal atrial fibrillation: Secondary | ICD-10-CM | POA: Diagnosis not present

## 2023-08-22 DIAGNOSIS — R350 Frequency of micturition: Secondary | ICD-10-CM | POA: Diagnosis not present

## 2023-08-22 DIAGNOSIS — R0602 Shortness of breath: Secondary | ICD-10-CM | POA: Diagnosis not present

## 2023-08-22 DIAGNOSIS — I10 Essential (primary) hypertension: Secondary | ICD-10-CM | POA: Diagnosis not present

## 2023-08-22 DIAGNOSIS — R0989 Other specified symptoms and signs involving the circulatory and respiratory systems: Secondary | ICD-10-CM | POA: Diagnosis not present

## 2023-08-22 DIAGNOSIS — R309 Painful micturition, unspecified: Secondary | ICD-10-CM | POA: Diagnosis not present

## 2023-08-22 DIAGNOSIS — N3281 Overactive bladder: Secondary | ICD-10-CM | POA: Diagnosis not present

## 2023-08-26 DIAGNOSIS — I482 Chronic atrial fibrillation, unspecified: Secondary | ICD-10-CM | POA: Diagnosis not present

## 2023-08-26 DIAGNOSIS — M1612 Unilateral primary osteoarthritis, left hip: Secondary | ICD-10-CM | POA: Diagnosis not present

## 2023-08-26 DIAGNOSIS — R262 Difficulty in walking, not elsewhere classified: Secondary | ICD-10-CM | POA: Diagnosis not present

## 2023-08-28 DIAGNOSIS — R262 Difficulty in walking, not elsewhere classified: Secondary | ICD-10-CM | POA: Diagnosis not present

## 2023-08-28 DIAGNOSIS — I482 Chronic atrial fibrillation, unspecified: Secondary | ICD-10-CM | POA: Diagnosis not present

## 2023-08-28 DIAGNOSIS — M1612 Unilateral primary osteoarthritis, left hip: Secondary | ICD-10-CM | POA: Diagnosis not present

## 2023-08-30 DIAGNOSIS — R262 Difficulty in walking, not elsewhere classified: Secondary | ICD-10-CM | POA: Diagnosis not present

## 2023-08-30 DIAGNOSIS — M1612 Unilateral primary osteoarthritis, left hip: Secondary | ICD-10-CM | POA: Diagnosis not present

## 2023-08-30 DIAGNOSIS — I482 Chronic atrial fibrillation, unspecified: Secondary | ICD-10-CM | POA: Diagnosis not present

## 2023-08-31 DIAGNOSIS — Z66 Do not resuscitate: Secondary | ICD-10-CM | POA: Diagnosis not present

## 2023-08-31 DIAGNOSIS — E039 Hypothyroidism, unspecified: Secondary | ICD-10-CM | POA: Diagnosis not present

## 2023-08-31 DIAGNOSIS — I517 Cardiomegaly: Secondary | ICD-10-CM | POA: Diagnosis not present

## 2023-08-31 DIAGNOSIS — I10 Essential (primary) hypertension: Secondary | ICD-10-CM | POA: Diagnosis not present

## 2023-08-31 DIAGNOSIS — R9431 Abnormal electrocardiogram [ECG] [EKG]: Secondary | ICD-10-CM | POA: Diagnosis not present

## 2023-08-31 DIAGNOSIS — E43 Unspecified severe protein-calorie malnutrition: Secondary | ICD-10-CM | POA: Diagnosis not present

## 2023-08-31 DIAGNOSIS — R509 Fever, unspecified: Secondary | ICD-10-CM | POA: Diagnosis not present

## 2023-08-31 DIAGNOSIS — I480 Paroxysmal atrial fibrillation: Secondary | ICD-10-CM | POA: Diagnosis not present

## 2023-08-31 DIAGNOSIS — R059 Cough, unspecified: Secondary | ICD-10-CM | POA: Diagnosis not present

## 2023-08-31 DIAGNOSIS — Z950 Presence of cardiac pacemaker: Secondary | ICD-10-CM | POA: Diagnosis not present

## 2023-08-31 DIAGNOSIS — R918 Other nonspecific abnormal finding of lung field: Secondary | ICD-10-CM | POA: Diagnosis not present

## 2023-08-31 DIAGNOSIS — J479 Bronchiectasis, uncomplicated: Secondary | ICD-10-CM | POA: Diagnosis not present

## 2023-08-31 DIAGNOSIS — Z23 Encounter for immunization: Secondary | ICD-10-CM | POA: Diagnosis not present

## 2023-08-31 DIAGNOSIS — I459 Conduction disorder, unspecified: Secondary | ICD-10-CM | POA: Diagnosis not present

## 2023-08-31 DIAGNOSIS — Z743 Need for continuous supervision: Secondary | ICD-10-CM | POA: Diagnosis not present

## 2023-08-31 DIAGNOSIS — J151 Pneumonia due to Pseudomonas: Secondary | ICD-10-CM | POA: Diagnosis not present

## 2023-08-31 DIAGNOSIS — I129 Hypertensive chronic kidney disease with stage 1 through stage 4 chronic kidney disease, or unspecified chronic kidney disease: Secondary | ICD-10-CM | POA: Diagnosis not present

## 2023-08-31 DIAGNOSIS — J470 Bronchiectasis with acute lower respiratory infection: Secondary | ICD-10-CM | POA: Diagnosis not present

## 2023-08-31 DIAGNOSIS — Z853 Personal history of malignant neoplasm of breast: Secondary | ICD-10-CM | POA: Diagnosis not present

## 2023-08-31 DIAGNOSIS — Z681 Body mass index (BMI) 19 or less, adult: Secondary | ICD-10-CM | POA: Diagnosis not present

## 2023-08-31 DIAGNOSIS — E871 Hypo-osmolality and hyponatremia: Secondary | ICD-10-CM | POA: Diagnosis not present

## 2023-08-31 DIAGNOSIS — R0602 Shortness of breath: Secondary | ICD-10-CM | POA: Diagnosis not present

## 2023-08-31 DIAGNOSIS — A310 Pulmonary mycobacterial infection: Secondary | ICD-10-CM | POA: Diagnosis not present

## 2023-08-31 DIAGNOSIS — R0902 Hypoxemia: Secondary | ICD-10-CM | POA: Diagnosis not present

## 2023-09-01 DIAGNOSIS — I459 Conduction disorder, unspecified: Secondary | ICD-10-CM | POA: Diagnosis not present

## 2023-09-01 DIAGNOSIS — E039 Hypothyroidism, unspecified: Secondary | ICD-10-CM | POA: Diagnosis not present

## 2023-09-01 DIAGNOSIS — Z743 Need for continuous supervision: Secondary | ICD-10-CM | POA: Diagnosis not present

## 2023-09-01 DIAGNOSIS — J151 Pneumonia due to Pseudomonas: Secondary | ICD-10-CM | POA: Diagnosis not present

## 2023-09-01 DIAGNOSIS — A310 Pulmonary mycobacterial infection: Secondary | ICD-10-CM | POA: Diagnosis not present

## 2023-09-01 DIAGNOSIS — R0902 Hypoxemia: Secondary | ICD-10-CM | POA: Diagnosis not present

## 2023-09-01 DIAGNOSIS — I480 Paroxysmal atrial fibrillation: Secondary | ICD-10-CM | POA: Diagnosis not present

## 2023-09-01 DIAGNOSIS — R0602 Shortness of breath: Secondary | ICD-10-CM | POA: Diagnosis not present

## 2023-09-01 DIAGNOSIS — E871 Hypo-osmolality and hyponatremia: Secondary | ICD-10-CM | POA: Diagnosis not present

## 2023-09-01 DIAGNOSIS — E43 Unspecified severe protein-calorie malnutrition: Secondary | ICD-10-CM | POA: Diagnosis not present

## 2023-09-01 DIAGNOSIS — R9431 Abnormal electrocardiogram [ECG] [EKG]: Secondary | ICD-10-CM | POA: Diagnosis not present

## 2023-09-01 DIAGNOSIS — I10 Essential (primary) hypertension: Secondary | ICD-10-CM | POA: Diagnosis not present

## 2023-09-02 DIAGNOSIS — E039 Hypothyroidism, unspecified: Secondary | ICD-10-CM | POA: Diagnosis not present

## 2023-09-02 DIAGNOSIS — I10 Essential (primary) hypertension: Secondary | ICD-10-CM | POA: Diagnosis not present

## 2023-09-02 DIAGNOSIS — I480 Paroxysmal atrial fibrillation: Secondary | ICD-10-CM | POA: Diagnosis not present

## 2023-09-02 DIAGNOSIS — J151 Pneumonia due to Pseudomonas: Secondary | ICD-10-CM | POA: Diagnosis not present

## 2023-09-02 DIAGNOSIS — R0902 Hypoxemia: Secondary | ICD-10-CM | POA: Diagnosis not present

## 2023-09-02 DIAGNOSIS — A310 Pulmonary mycobacterial infection: Secondary | ICD-10-CM | POA: Diagnosis not present

## 2023-09-02 DIAGNOSIS — E871 Hypo-osmolality and hyponatremia: Secondary | ICD-10-CM | POA: Diagnosis not present

## 2023-09-02 DIAGNOSIS — E43 Unspecified severe protein-calorie malnutrition: Secondary | ICD-10-CM | POA: Diagnosis not present

## 2023-09-03 DIAGNOSIS — E871 Hypo-osmolality and hyponatremia: Secondary | ICD-10-CM | POA: Diagnosis not present

## 2023-09-03 DIAGNOSIS — I480 Paroxysmal atrial fibrillation: Secondary | ICD-10-CM | POA: Diagnosis not present

## 2023-09-03 DIAGNOSIS — A310 Pulmonary mycobacterial infection: Secondary | ICD-10-CM | POA: Diagnosis not present

## 2023-09-03 DIAGNOSIS — I10 Essential (primary) hypertension: Secondary | ICD-10-CM | POA: Diagnosis not present

## 2023-09-03 DIAGNOSIS — J151 Pneumonia due to Pseudomonas: Secondary | ICD-10-CM | POA: Diagnosis not present

## 2023-09-03 DIAGNOSIS — E039 Hypothyroidism, unspecified: Secondary | ICD-10-CM | POA: Diagnosis not present

## 2023-09-03 DIAGNOSIS — E43 Unspecified severe protein-calorie malnutrition: Secondary | ICD-10-CM | POA: Diagnosis not present

## 2023-09-03 DIAGNOSIS — R0902 Hypoxemia: Secondary | ICD-10-CM | POA: Diagnosis not present

## 2023-09-04 DIAGNOSIS — J151 Pneumonia due to Pseudomonas: Secondary | ICD-10-CM | POA: Diagnosis not present

## 2023-09-04 DIAGNOSIS — A310 Pulmonary mycobacterial infection: Secondary | ICD-10-CM | POA: Diagnosis not present

## 2023-09-09 DIAGNOSIS — A310 Pulmonary mycobacterial infection: Secondary | ICD-10-CM | POA: Diagnosis not present

## 2023-09-09 DIAGNOSIS — J151 Pneumonia due to Pseudomonas: Secondary | ICD-10-CM | POA: Diagnosis not present

## 2023-09-09 DIAGNOSIS — N189 Chronic kidney disease, unspecified: Secondary | ICD-10-CM | POA: Diagnosis not present

## 2023-09-09 DIAGNOSIS — S81812A Laceration without foreign body, left lower leg, initial encounter: Secondary | ICD-10-CM | POA: Diagnosis not present

## 2023-09-09 DIAGNOSIS — I495 Sick sinus syndrome: Secondary | ICD-10-CM | POA: Diagnosis not present

## 2023-09-09 DIAGNOSIS — E43 Unspecified severe protein-calorie malnutrition: Secondary | ICD-10-CM | POA: Diagnosis not present

## 2023-09-09 DIAGNOSIS — R627 Adult failure to thrive: Secondary | ICD-10-CM | POA: Diagnosis not present

## 2023-09-09 DIAGNOSIS — E039 Hypothyroidism, unspecified: Secondary | ICD-10-CM | POA: Diagnosis not present

## 2023-09-09 DIAGNOSIS — J479 Bronchiectasis, uncomplicated: Secondary | ICD-10-CM | POA: Diagnosis not present

## 2023-09-09 DIAGNOSIS — R0902 Hypoxemia: Secondary | ICD-10-CM | POA: Diagnosis not present

## 2023-09-09 DIAGNOSIS — R58 Hemorrhage, not elsewhere classified: Secondary | ICD-10-CM | POA: Diagnosis not present

## 2023-09-09 DIAGNOSIS — E871 Hypo-osmolality and hyponatremia: Secondary | ICD-10-CM | POA: Diagnosis not present

## 2023-09-09 DIAGNOSIS — I129 Hypertensive chronic kidney disease with stage 1 through stage 4 chronic kidney disease, or unspecified chronic kidney disease: Secondary | ICD-10-CM | POA: Diagnosis not present

## 2023-09-09 DIAGNOSIS — I480 Paroxysmal atrial fibrillation: Secondary | ICD-10-CM | POA: Diagnosis not present

## 2023-09-09 DIAGNOSIS — I4891 Unspecified atrial fibrillation: Secondary | ICD-10-CM | POA: Diagnosis not present

## 2023-09-09 DIAGNOSIS — R5381 Other malaise: Secondary | ICD-10-CM | POA: Diagnosis not present

## 2023-09-09 DIAGNOSIS — Z23 Encounter for immunization: Secondary | ICD-10-CM | POA: Diagnosis not present

## 2023-09-09 DIAGNOSIS — D649 Anemia, unspecified: Secondary | ICD-10-CM | POA: Diagnosis not present

## 2023-09-09 DIAGNOSIS — J189 Pneumonia, unspecified organism: Secondary | ICD-10-CM | POA: Diagnosis not present

## 2023-09-09 DIAGNOSIS — Z952 Presence of prosthetic heart valve: Secondary | ICD-10-CM | POA: Diagnosis not present

## 2023-09-09 DIAGNOSIS — Z7901 Long term (current) use of anticoagulants: Secondary | ICD-10-CM | POA: Diagnosis not present

## 2023-09-09 DIAGNOSIS — I25118 Atherosclerotic heart disease of native coronary artery with other forms of angina pectoris: Secondary | ICD-10-CM | POA: Diagnosis not present

## 2023-09-09 DIAGNOSIS — E1121 Type 2 diabetes mellitus with diabetic nephropathy: Secondary | ICD-10-CM | POA: Diagnosis not present

## 2023-09-09 DIAGNOSIS — Z743 Need for continuous supervision: Secondary | ICD-10-CM | POA: Diagnosis not present

## 2023-09-09 DIAGNOSIS — K5904 Chronic idiopathic constipation: Secondary | ICD-10-CM | POA: Diagnosis not present

## 2023-09-09 DIAGNOSIS — Z853 Personal history of malignant neoplasm of breast: Secondary | ICD-10-CM | POA: Diagnosis not present

## 2023-09-09 DIAGNOSIS — I10 Essential (primary) hypertension: Secondary | ICD-10-CM | POA: Diagnosis not present

## 2023-09-09 DIAGNOSIS — J9601 Acute respiratory failure with hypoxia: Secondary | ICD-10-CM | POA: Diagnosis not present

## 2023-09-11 DIAGNOSIS — Z853 Personal history of malignant neoplasm of breast: Secondary | ICD-10-CM | POA: Diagnosis not present

## 2023-09-11 DIAGNOSIS — I495 Sick sinus syndrome: Secondary | ICD-10-CM | POA: Diagnosis not present

## 2023-09-11 DIAGNOSIS — J479 Bronchiectasis, uncomplicated: Secondary | ICD-10-CM | POA: Diagnosis not present

## 2023-09-11 DIAGNOSIS — E039 Hypothyroidism, unspecified: Secondary | ICD-10-CM | POA: Diagnosis not present

## 2023-09-11 DIAGNOSIS — I10 Essential (primary) hypertension: Secondary | ICD-10-CM | POA: Diagnosis not present

## 2023-09-11 DIAGNOSIS — I25118 Atherosclerotic heart disease of native coronary artery with other forms of angina pectoris: Secondary | ICD-10-CM | POA: Diagnosis not present

## 2023-09-11 DIAGNOSIS — I480 Paroxysmal atrial fibrillation: Secondary | ICD-10-CM | POA: Diagnosis not present

## 2023-09-11 DIAGNOSIS — J151 Pneumonia due to Pseudomonas: Secondary | ICD-10-CM | POA: Diagnosis not present

## 2023-09-11 DIAGNOSIS — Z952 Presence of prosthetic heart valve: Secondary | ICD-10-CM | POA: Diagnosis not present

## 2023-09-16 DIAGNOSIS — I480 Paroxysmal atrial fibrillation: Secondary | ICD-10-CM | POA: Diagnosis not present

## 2023-09-16 DIAGNOSIS — K5904 Chronic idiopathic constipation: Secondary | ICD-10-CM | POA: Diagnosis not present

## 2023-09-16 DIAGNOSIS — R5381 Other malaise: Secondary | ICD-10-CM | POA: Diagnosis not present

## 2023-09-16 DIAGNOSIS — A310 Pulmonary mycobacterial infection: Secondary | ICD-10-CM | POA: Diagnosis not present

## 2023-09-16 DIAGNOSIS — J151 Pneumonia due to Pseudomonas: Secondary | ICD-10-CM | POA: Diagnosis not present

## 2023-09-16 DIAGNOSIS — J9601 Acute respiratory failure with hypoxia: Secondary | ICD-10-CM | POA: Diagnosis not present

## 2023-09-16 DIAGNOSIS — I495 Sick sinus syndrome: Secondary | ICD-10-CM | POA: Diagnosis not present

## 2023-09-16 DIAGNOSIS — J479 Bronchiectasis, uncomplicated: Secondary | ICD-10-CM | POA: Diagnosis not present

## 2023-09-23 DIAGNOSIS — I495 Sick sinus syndrome: Secondary | ICD-10-CM | POA: Diagnosis not present

## 2023-09-23 DIAGNOSIS — Z853 Personal history of malignant neoplasm of breast: Secondary | ICD-10-CM | POA: Diagnosis not present

## 2023-09-23 DIAGNOSIS — E039 Hypothyroidism, unspecified: Secondary | ICD-10-CM | POA: Diagnosis not present

## 2023-09-23 DIAGNOSIS — J479 Bronchiectasis, uncomplicated: Secondary | ICD-10-CM | POA: Diagnosis not present

## 2023-09-23 DIAGNOSIS — K5904 Chronic idiopathic constipation: Secondary | ICD-10-CM | POA: Diagnosis not present

## 2023-09-23 DIAGNOSIS — A310 Pulmonary mycobacterial infection: Secondary | ICD-10-CM | POA: Diagnosis not present

## 2023-09-23 DIAGNOSIS — J151 Pneumonia due to Pseudomonas: Secondary | ICD-10-CM | POA: Diagnosis not present

## 2023-09-23 DIAGNOSIS — I10 Essential (primary) hypertension: Secondary | ICD-10-CM | POA: Diagnosis not present

## 2023-09-23 DIAGNOSIS — I480 Paroxysmal atrial fibrillation: Secondary | ICD-10-CM | POA: Diagnosis not present

## 2023-09-24 DIAGNOSIS — I4891 Unspecified atrial fibrillation: Secondary | ICD-10-CM | POA: Diagnosis not present

## 2023-09-24 DIAGNOSIS — Z853 Personal history of malignant neoplasm of breast: Secondary | ICD-10-CM | POA: Diagnosis not present

## 2023-09-24 DIAGNOSIS — I10 Essential (primary) hypertension: Secondary | ICD-10-CM | POA: Diagnosis not present

## 2023-09-24 DIAGNOSIS — N189 Chronic kidney disease, unspecified: Secondary | ICD-10-CM | POA: Diagnosis not present

## 2023-09-24 DIAGNOSIS — S81812A Laceration without foreign body, left lower leg, initial encounter: Secondary | ICD-10-CM | POA: Diagnosis not present

## 2023-09-24 DIAGNOSIS — E039 Hypothyroidism, unspecified: Secondary | ICD-10-CM | POA: Diagnosis not present

## 2023-09-24 DIAGNOSIS — Z7901 Long term (current) use of anticoagulants: Secondary | ICD-10-CM | POA: Diagnosis not present

## 2023-09-24 DIAGNOSIS — I129 Hypertensive chronic kidney disease with stage 1 through stage 4 chronic kidney disease, or unspecified chronic kidney disease: Secondary | ICD-10-CM | POA: Diagnosis not present

## 2023-09-27 DIAGNOSIS — J984 Other disorders of lung: Secondary | ICD-10-CM | POA: Diagnosis not present

## 2023-09-27 DIAGNOSIS — S8992XA Unspecified injury of left lower leg, initial encounter: Secondary | ICD-10-CM | POA: Diagnosis not present

## 2023-09-27 DIAGNOSIS — E039 Hypothyroidism, unspecified: Secondary | ICD-10-CM | POA: Diagnosis not present

## 2023-09-27 DIAGNOSIS — I4891 Unspecified atrial fibrillation: Secondary | ICD-10-CM | POA: Diagnosis not present

## 2023-09-27 DIAGNOSIS — I129 Hypertensive chronic kidney disease with stage 1 through stage 4 chronic kidney disease, or unspecified chronic kidney disease: Secondary | ICD-10-CM | POA: Diagnosis not present

## 2023-09-27 DIAGNOSIS — S0990XA Unspecified injury of head, initial encounter: Secondary | ICD-10-CM | POA: Diagnosis not present

## 2023-09-27 DIAGNOSIS — M546 Pain in thoracic spine: Secondary | ICD-10-CM | POA: Diagnosis not present

## 2023-09-27 DIAGNOSIS — S299XXA Unspecified injury of thorax, initial encounter: Secondary | ICD-10-CM | POA: Diagnosis not present

## 2023-09-27 DIAGNOSIS — T1490XA Injury, unspecified, initial encounter: Secondary | ICD-10-CM | POA: Diagnosis not present

## 2023-09-27 DIAGNOSIS — M545 Low back pain, unspecified: Secondary | ICD-10-CM | POA: Diagnosis not present

## 2023-09-27 DIAGNOSIS — S3992XA Unspecified injury of lower back, initial encounter: Secondary | ICD-10-CM | POA: Diagnosis not present

## 2023-09-27 DIAGNOSIS — Z7901 Long term (current) use of anticoagulants: Secondary | ICD-10-CM | POA: Diagnosis not present

## 2023-09-27 DIAGNOSIS — M51369 Other intervertebral disc degeneration, lumbar region without mention of lumbar back pain or lower extremity pain: Secondary | ICD-10-CM | POA: Diagnosis not present

## 2023-09-27 DIAGNOSIS — Z853 Personal history of malignant neoplasm of breast: Secondary | ICD-10-CM | POA: Diagnosis not present

## 2023-09-27 DIAGNOSIS — N189 Chronic kidney disease, unspecified: Secondary | ICD-10-CM | POA: Diagnosis not present

## 2023-09-27 DIAGNOSIS — S199XXA Unspecified injury of neck, initial encounter: Secondary | ICD-10-CM | POA: Diagnosis not present

## 2023-09-27 DIAGNOSIS — S81812A Laceration without foreign body, left lower leg, initial encounter: Secondary | ICD-10-CM | POA: Diagnosis not present

## 2023-09-27 DIAGNOSIS — Z743 Need for continuous supervision: Secondary | ICD-10-CM | POA: Diagnosis not present

## 2023-09-28 DIAGNOSIS — S299XXA Unspecified injury of thorax, initial encounter: Secondary | ICD-10-CM | POA: Diagnosis not present

## 2023-09-28 DIAGNOSIS — J984 Other disorders of lung: Secondary | ICD-10-CM | POA: Diagnosis not present

## 2023-09-30 DIAGNOSIS — I480 Paroxysmal atrial fibrillation: Secondary | ICD-10-CM | POA: Diagnosis not present

## 2023-09-30 DIAGNOSIS — W1830XA Fall on same level, unspecified, initial encounter: Secondary | ICD-10-CM | POA: Diagnosis not present

## 2023-09-30 DIAGNOSIS — S81812A Laceration without foreign body, left lower leg, initial encounter: Secondary | ICD-10-CM | POA: Diagnosis not present

## 2023-10-03 DIAGNOSIS — I119 Hypertensive heart disease without heart failure: Secondary | ICD-10-CM | POA: Diagnosis not present

## 2023-10-03 DIAGNOSIS — S81812D Laceration without foreign body, left lower leg, subsequent encounter: Secondary | ICD-10-CM | POA: Diagnosis not present

## 2023-10-03 DIAGNOSIS — R197 Diarrhea, unspecified: Secondary | ICD-10-CM | POA: Diagnosis not present

## 2023-10-04 DIAGNOSIS — I480 Paroxysmal atrial fibrillation: Secondary | ICD-10-CM | POA: Diagnosis not present

## 2023-10-04 DIAGNOSIS — J479 Bronchiectasis, uncomplicated: Secondary | ICD-10-CM | POA: Diagnosis not present

## 2023-10-04 DIAGNOSIS — I5032 Chronic diastolic (congestive) heart failure: Secondary | ICD-10-CM | POA: Diagnosis not present

## 2023-10-04 DIAGNOSIS — R413 Other amnesia: Secondary | ICD-10-CM | POA: Diagnosis not present

## 2023-10-04 DIAGNOSIS — I130 Hypertensive heart and chronic kidney disease with heart failure and stage 1 through stage 4 chronic kidney disease, or unspecified chronic kidney disease: Secondary | ICD-10-CM | POA: Diagnosis not present

## 2023-10-04 DIAGNOSIS — N189 Chronic kidney disease, unspecified: Secondary | ICD-10-CM | POA: Diagnosis not present

## 2023-10-04 DIAGNOSIS — I25118 Atherosclerotic heart disease of native coronary artery with other forms of angina pectoris: Secondary | ICD-10-CM | POA: Diagnosis not present

## 2023-10-04 DIAGNOSIS — Z8701 Personal history of pneumonia (recurrent): Secondary | ICD-10-CM | POA: Diagnosis not present

## 2023-10-04 DIAGNOSIS — W1830XD Fall on same level, unspecified, subsequent encounter: Secondary | ICD-10-CM | POA: Diagnosis not present

## 2023-10-04 DIAGNOSIS — S81812D Laceration without foreign body, left lower leg, subsequent encounter: Secondary | ICD-10-CM | POA: Diagnosis not present

## 2023-10-04 DIAGNOSIS — M150 Primary generalized (osteo)arthritis: Secondary | ICD-10-CM | POA: Diagnosis not present

## 2023-10-08 DIAGNOSIS — E782 Mixed hyperlipidemia: Secondary | ICD-10-CM | POA: Diagnosis not present

## 2023-10-08 DIAGNOSIS — E039 Hypothyroidism, unspecified: Secondary | ICD-10-CM | POA: Diagnosis not present

## 2023-10-08 DIAGNOSIS — I480 Paroxysmal atrial fibrillation: Secondary | ICD-10-CM | POA: Diagnosis not present

## 2023-10-08 DIAGNOSIS — I119 Hypertensive heart disease without heart failure: Secondary | ICD-10-CM | POA: Diagnosis not present

## 2023-10-09 DIAGNOSIS — N189 Chronic kidney disease, unspecified: Secondary | ICD-10-CM | POA: Diagnosis not present

## 2023-10-09 DIAGNOSIS — J479 Bronchiectasis, uncomplicated: Secondary | ICD-10-CM | POA: Diagnosis not present

## 2023-10-09 DIAGNOSIS — I480 Paroxysmal atrial fibrillation: Secondary | ICD-10-CM | POA: Diagnosis not present

## 2023-10-09 DIAGNOSIS — I130 Hypertensive heart and chronic kidney disease with heart failure and stage 1 through stage 4 chronic kidney disease, or unspecified chronic kidney disease: Secondary | ICD-10-CM | POA: Diagnosis not present

## 2023-10-09 DIAGNOSIS — R413 Other amnesia: Secondary | ICD-10-CM | POA: Diagnosis not present

## 2023-10-09 DIAGNOSIS — I5032 Chronic diastolic (congestive) heart failure: Secondary | ICD-10-CM | POA: Diagnosis not present

## 2023-10-09 DIAGNOSIS — I25118 Atherosclerotic heart disease of native coronary artery with other forms of angina pectoris: Secondary | ICD-10-CM | POA: Diagnosis not present

## 2023-10-09 DIAGNOSIS — Z8701 Personal history of pneumonia (recurrent): Secondary | ICD-10-CM | POA: Diagnosis not present

## 2023-10-09 DIAGNOSIS — M150 Primary generalized (osteo)arthritis: Secondary | ICD-10-CM | POA: Diagnosis not present

## 2023-10-10 DIAGNOSIS — S81812D Laceration without foreign body, left lower leg, subsequent encounter: Secondary | ICD-10-CM | POA: Diagnosis not present

## 2023-10-10 DIAGNOSIS — R197 Diarrhea, unspecified: Secondary | ICD-10-CM | POA: Diagnosis not present

## 2023-10-10 DIAGNOSIS — W1830XD Fall on same level, unspecified, subsequent encounter: Secondary | ICD-10-CM | POA: Diagnosis not present

## 2023-10-11 DIAGNOSIS — I480 Paroxysmal atrial fibrillation: Secondary | ICD-10-CM | POA: Diagnosis not present

## 2023-10-11 DIAGNOSIS — N189 Chronic kidney disease, unspecified: Secondary | ICD-10-CM | POA: Diagnosis not present

## 2023-10-11 DIAGNOSIS — Z8701 Personal history of pneumonia (recurrent): Secondary | ICD-10-CM | POA: Diagnosis not present

## 2023-10-11 DIAGNOSIS — R413 Other amnesia: Secondary | ICD-10-CM | POA: Diagnosis not present

## 2023-10-11 DIAGNOSIS — M150 Primary generalized (osteo)arthritis: Secondary | ICD-10-CM | POA: Diagnosis not present

## 2023-10-11 DIAGNOSIS — I130 Hypertensive heart and chronic kidney disease with heart failure and stage 1 through stage 4 chronic kidney disease, or unspecified chronic kidney disease: Secondary | ICD-10-CM | POA: Diagnosis not present

## 2023-10-11 DIAGNOSIS — I25118 Atherosclerotic heart disease of native coronary artery with other forms of angina pectoris: Secondary | ICD-10-CM | POA: Diagnosis not present

## 2023-10-11 DIAGNOSIS — I5032 Chronic diastolic (congestive) heart failure: Secondary | ICD-10-CM | POA: Diagnosis not present

## 2023-10-11 DIAGNOSIS — J479 Bronchiectasis, uncomplicated: Secondary | ICD-10-CM | POA: Diagnosis not present

## 2023-10-14 DIAGNOSIS — R197 Diarrhea, unspecified: Secondary | ICD-10-CM | POA: Diagnosis not present

## 2023-10-14 DIAGNOSIS — W1830XD Fall on same level, unspecified, subsequent encounter: Secondary | ICD-10-CM | POA: Diagnosis not present

## 2023-10-14 DIAGNOSIS — E782 Mixed hyperlipidemia: Secondary | ICD-10-CM | POA: Diagnosis not present

## 2023-10-14 DIAGNOSIS — Z8701 Personal history of pneumonia (recurrent): Secondary | ICD-10-CM | POA: Diagnosis not present

## 2023-10-14 DIAGNOSIS — I480 Paroxysmal atrial fibrillation: Secondary | ICD-10-CM | POA: Diagnosis not present

## 2023-10-14 DIAGNOSIS — R413 Other amnesia: Secondary | ICD-10-CM | POA: Diagnosis not present

## 2023-10-14 DIAGNOSIS — E039 Hypothyroidism, unspecified: Secondary | ICD-10-CM | POA: Diagnosis not present

## 2023-10-14 DIAGNOSIS — J479 Bronchiectasis, uncomplicated: Secondary | ICD-10-CM | POA: Diagnosis not present

## 2023-10-14 DIAGNOSIS — N189 Chronic kidney disease, unspecified: Secondary | ICD-10-CM | POA: Diagnosis not present

## 2023-10-14 DIAGNOSIS — M6281 Muscle weakness (generalized): Secondary | ICD-10-CM | POA: Diagnosis not present

## 2023-10-14 DIAGNOSIS — I25118 Atherosclerotic heart disease of native coronary artery with other forms of angina pectoris: Secondary | ICD-10-CM | POA: Diagnosis not present

## 2023-10-14 DIAGNOSIS — I5032 Chronic diastolic (congestive) heart failure: Secondary | ICD-10-CM | POA: Diagnosis not present

## 2023-10-14 DIAGNOSIS — M150 Primary generalized (osteo)arthritis: Secondary | ICD-10-CM | POA: Diagnosis not present

## 2023-10-14 DIAGNOSIS — S81812D Laceration without foreign body, left lower leg, subsequent encounter: Secondary | ICD-10-CM | POA: Diagnosis not present

## 2023-10-14 DIAGNOSIS — I2510 Atherosclerotic heart disease of native coronary artery without angina pectoris: Secondary | ICD-10-CM | POA: Diagnosis not present

## 2023-10-14 DIAGNOSIS — I130 Hypertensive heart and chronic kidney disease with heart failure and stage 1 through stage 4 chronic kidney disease, or unspecified chronic kidney disease: Secondary | ICD-10-CM | POA: Diagnosis not present

## 2023-10-15 DIAGNOSIS — I25118 Atherosclerotic heart disease of native coronary artery with other forms of angina pectoris: Secondary | ICD-10-CM | POA: Diagnosis not present

## 2023-10-15 DIAGNOSIS — Z8701 Personal history of pneumonia (recurrent): Secondary | ICD-10-CM | POA: Diagnosis not present

## 2023-10-15 DIAGNOSIS — I5032 Chronic diastolic (congestive) heart failure: Secondary | ICD-10-CM | POA: Diagnosis not present

## 2023-10-15 DIAGNOSIS — J479 Bronchiectasis, uncomplicated: Secondary | ICD-10-CM | POA: Diagnosis not present

## 2023-10-15 DIAGNOSIS — N189 Chronic kidney disease, unspecified: Secondary | ICD-10-CM | POA: Diagnosis not present

## 2023-10-15 DIAGNOSIS — R413 Other amnesia: Secondary | ICD-10-CM | POA: Diagnosis not present

## 2023-10-15 DIAGNOSIS — M150 Primary generalized (osteo)arthritis: Secondary | ICD-10-CM | POA: Diagnosis not present

## 2023-10-15 DIAGNOSIS — I130 Hypertensive heart and chronic kidney disease with heart failure and stage 1 through stage 4 chronic kidney disease, or unspecified chronic kidney disease: Secondary | ICD-10-CM | POA: Diagnosis not present

## 2023-10-15 DIAGNOSIS — I480 Paroxysmal atrial fibrillation: Secondary | ICD-10-CM | POA: Diagnosis not present

## 2023-10-16 ENCOUNTER — Ambulatory Visit: Payer: Medicare PPO

## 2023-10-17 DIAGNOSIS — J479 Bronchiectasis, uncomplicated: Secondary | ICD-10-CM | POA: Diagnosis not present

## 2023-10-17 DIAGNOSIS — I5032 Chronic diastolic (congestive) heart failure: Secondary | ICD-10-CM | POA: Diagnosis not present

## 2023-10-17 DIAGNOSIS — Z8701 Personal history of pneumonia (recurrent): Secondary | ICD-10-CM | POA: Diagnosis not present

## 2023-10-17 DIAGNOSIS — R413 Other amnesia: Secondary | ICD-10-CM | POA: Diagnosis not present

## 2023-10-17 DIAGNOSIS — M150 Primary generalized (osteo)arthritis: Secondary | ICD-10-CM | POA: Diagnosis not present

## 2023-10-17 DIAGNOSIS — N189 Chronic kidney disease, unspecified: Secondary | ICD-10-CM | POA: Diagnosis not present

## 2023-10-17 DIAGNOSIS — I25118 Atherosclerotic heart disease of native coronary artery with other forms of angina pectoris: Secondary | ICD-10-CM | POA: Diagnosis not present

## 2023-10-17 DIAGNOSIS — I130 Hypertensive heart and chronic kidney disease with heart failure and stage 1 through stage 4 chronic kidney disease, or unspecified chronic kidney disease: Secondary | ICD-10-CM | POA: Diagnosis not present

## 2023-10-17 DIAGNOSIS — I480 Paroxysmal atrial fibrillation: Secondary | ICD-10-CM | POA: Diagnosis not present

## 2023-10-21 DIAGNOSIS — M150 Primary generalized (osteo)arthritis: Secondary | ICD-10-CM | POA: Diagnosis not present

## 2023-10-21 DIAGNOSIS — I5032 Chronic diastolic (congestive) heart failure: Secondary | ICD-10-CM | POA: Diagnosis not present

## 2023-10-21 DIAGNOSIS — Z8701 Personal history of pneumonia (recurrent): Secondary | ICD-10-CM | POA: Diagnosis not present

## 2023-10-21 DIAGNOSIS — N189 Chronic kidney disease, unspecified: Secondary | ICD-10-CM | POA: Diagnosis not present

## 2023-10-21 DIAGNOSIS — I480 Paroxysmal atrial fibrillation: Secondary | ICD-10-CM | POA: Diagnosis not present

## 2023-10-21 DIAGNOSIS — I25118 Atherosclerotic heart disease of native coronary artery with other forms of angina pectoris: Secondary | ICD-10-CM | POA: Diagnosis not present

## 2023-10-21 DIAGNOSIS — I130 Hypertensive heart and chronic kidney disease with heart failure and stage 1 through stage 4 chronic kidney disease, or unspecified chronic kidney disease: Secondary | ICD-10-CM | POA: Diagnosis not present

## 2023-10-21 DIAGNOSIS — J479 Bronchiectasis, uncomplicated: Secondary | ICD-10-CM | POA: Diagnosis not present

## 2023-10-21 DIAGNOSIS — R413 Other amnesia: Secondary | ICD-10-CM | POA: Diagnosis not present

## 2023-10-22 DIAGNOSIS — I480 Paroxysmal atrial fibrillation: Secondary | ICD-10-CM | POA: Diagnosis not present

## 2023-10-22 DIAGNOSIS — M150 Primary generalized (osteo)arthritis: Secondary | ICD-10-CM | POA: Diagnosis not present

## 2023-10-22 DIAGNOSIS — I25118 Atherosclerotic heart disease of native coronary artery with other forms of angina pectoris: Secondary | ICD-10-CM | POA: Diagnosis not present

## 2023-10-22 DIAGNOSIS — I5032 Chronic diastolic (congestive) heart failure: Secondary | ICD-10-CM | POA: Diagnosis not present

## 2023-10-22 DIAGNOSIS — K591 Functional diarrhea: Secondary | ICD-10-CM | POA: Diagnosis not present

## 2023-10-22 DIAGNOSIS — Z8701 Personal history of pneumonia (recurrent): Secondary | ICD-10-CM | POA: Diagnosis not present

## 2023-10-22 DIAGNOSIS — N189 Chronic kidney disease, unspecified: Secondary | ICD-10-CM | POA: Diagnosis not present

## 2023-10-22 DIAGNOSIS — R413 Other amnesia: Secondary | ICD-10-CM | POA: Diagnosis not present

## 2023-10-22 DIAGNOSIS — I130 Hypertensive heart and chronic kidney disease with heart failure and stage 1 through stage 4 chronic kidney disease, or unspecified chronic kidney disease: Secondary | ICD-10-CM | POA: Diagnosis not present

## 2023-10-22 DIAGNOSIS — J479 Bronchiectasis, uncomplicated: Secondary | ICD-10-CM | POA: Diagnosis not present

## 2023-10-22 DIAGNOSIS — S81812D Laceration without foreign body, left lower leg, subsequent encounter: Secondary | ICD-10-CM | POA: Diagnosis not present

## 2023-10-23 DIAGNOSIS — Z8701 Personal history of pneumonia (recurrent): Secondary | ICD-10-CM | POA: Diagnosis not present

## 2023-10-23 DIAGNOSIS — I480 Paroxysmal atrial fibrillation: Secondary | ICD-10-CM | POA: Diagnosis not present

## 2023-10-23 DIAGNOSIS — I5032 Chronic diastolic (congestive) heart failure: Secondary | ICD-10-CM | POA: Diagnosis not present

## 2023-10-23 DIAGNOSIS — I25118 Atherosclerotic heart disease of native coronary artery with other forms of angina pectoris: Secondary | ICD-10-CM | POA: Diagnosis not present

## 2023-10-23 DIAGNOSIS — I130 Hypertensive heart and chronic kidney disease with heart failure and stage 1 through stage 4 chronic kidney disease, or unspecified chronic kidney disease: Secondary | ICD-10-CM | POA: Diagnosis not present

## 2023-10-23 DIAGNOSIS — M150 Primary generalized (osteo)arthritis: Secondary | ICD-10-CM | POA: Diagnosis not present

## 2023-10-23 DIAGNOSIS — N189 Chronic kidney disease, unspecified: Secondary | ICD-10-CM | POA: Diagnosis not present

## 2023-10-23 DIAGNOSIS — R413 Other amnesia: Secondary | ICD-10-CM | POA: Diagnosis not present

## 2023-10-23 DIAGNOSIS — J479 Bronchiectasis, uncomplicated: Secondary | ICD-10-CM | POA: Diagnosis not present

## 2023-10-24 DIAGNOSIS — Z8701 Personal history of pneumonia (recurrent): Secondary | ICD-10-CM | POA: Diagnosis not present

## 2023-10-24 DIAGNOSIS — I5032 Chronic diastolic (congestive) heart failure: Secondary | ICD-10-CM | POA: Diagnosis not present

## 2023-10-24 DIAGNOSIS — I130 Hypertensive heart and chronic kidney disease with heart failure and stage 1 through stage 4 chronic kidney disease, or unspecified chronic kidney disease: Secondary | ICD-10-CM | POA: Diagnosis not present

## 2023-10-24 DIAGNOSIS — M150 Primary generalized (osteo)arthritis: Secondary | ICD-10-CM | POA: Diagnosis not present

## 2023-10-24 DIAGNOSIS — I25118 Atherosclerotic heart disease of native coronary artery with other forms of angina pectoris: Secondary | ICD-10-CM | POA: Diagnosis not present

## 2023-10-24 DIAGNOSIS — R413 Other amnesia: Secondary | ICD-10-CM | POA: Diagnosis not present

## 2023-10-24 DIAGNOSIS — J479 Bronchiectasis, uncomplicated: Secondary | ICD-10-CM | POA: Diagnosis not present

## 2023-10-24 DIAGNOSIS — I480 Paroxysmal atrial fibrillation: Secondary | ICD-10-CM | POA: Diagnosis not present

## 2023-10-24 DIAGNOSIS — N189 Chronic kidney disease, unspecified: Secondary | ICD-10-CM | POA: Diagnosis not present

## 2023-10-27 DIAGNOSIS — J151 Pneumonia due to Pseudomonas: Secondary | ICD-10-CM | POA: Diagnosis not present

## 2023-10-28 DIAGNOSIS — M79662 Pain in left lower leg: Secondary | ICD-10-CM | POA: Diagnosis not present

## 2023-10-28 DIAGNOSIS — I495 Sick sinus syndrome: Secondary | ICD-10-CM | POA: Diagnosis not present

## 2023-10-28 DIAGNOSIS — Z953 Presence of xenogenic heart valve: Secondary | ICD-10-CM | POA: Diagnosis not present

## 2023-10-28 DIAGNOSIS — Z743 Need for continuous supervision: Secondary | ICD-10-CM | POA: Diagnosis not present

## 2023-10-28 DIAGNOSIS — I2510 Atherosclerotic heart disease of native coronary artery without angina pectoris: Secondary | ICD-10-CM | POA: Diagnosis not present

## 2023-10-28 DIAGNOSIS — Z950 Presence of cardiac pacemaker: Secondary | ICD-10-CM | POA: Diagnosis not present

## 2023-10-28 DIAGNOSIS — M7989 Other specified soft tissue disorders: Secondary | ICD-10-CM | POA: Diagnosis not present

## 2023-10-28 DIAGNOSIS — H9193 Unspecified hearing loss, bilateral: Secondary | ICD-10-CM | POA: Diagnosis not present

## 2023-10-28 DIAGNOSIS — I350 Nonrheumatic aortic (valve) stenosis: Secondary | ICD-10-CM | POA: Diagnosis not present

## 2023-10-28 DIAGNOSIS — L089 Local infection of the skin and subcutaneous tissue, unspecified: Secondary | ICD-10-CM | POA: Diagnosis not present

## 2023-10-28 DIAGNOSIS — N189 Chronic kidney disease, unspecified: Secondary | ICD-10-CM | POA: Diagnosis not present

## 2023-10-28 DIAGNOSIS — R69 Illness, unspecified: Secondary | ICD-10-CM | POA: Diagnosis not present

## 2023-10-28 DIAGNOSIS — I5023 Acute on chronic systolic (congestive) heart failure: Secondary | ICD-10-CM | POA: Diagnosis not present

## 2023-10-29 DIAGNOSIS — S81812D Laceration without foreign body, left lower leg, subsequent encounter: Secondary | ICD-10-CM | POA: Diagnosis not present

## 2023-10-29 DIAGNOSIS — L03116 Cellulitis of left lower limb: Secondary | ICD-10-CM | POA: Diagnosis not present

## 2023-10-29 DIAGNOSIS — L853 Xerosis cutis: Secondary | ICD-10-CM | POA: Diagnosis not present

## 2023-10-31 DIAGNOSIS — R413 Other amnesia: Secondary | ICD-10-CM | POA: Diagnosis not present

## 2023-10-31 DIAGNOSIS — M150 Primary generalized (osteo)arthritis: Secondary | ICD-10-CM | POA: Diagnosis not present

## 2023-10-31 DIAGNOSIS — I130 Hypertensive heart and chronic kidney disease with heart failure and stage 1 through stage 4 chronic kidney disease, or unspecified chronic kidney disease: Secondary | ICD-10-CM | POA: Diagnosis not present

## 2023-10-31 DIAGNOSIS — Z8701 Personal history of pneumonia (recurrent): Secondary | ICD-10-CM | POA: Diagnosis not present

## 2023-10-31 DIAGNOSIS — I480 Paroxysmal atrial fibrillation: Secondary | ICD-10-CM | POA: Diagnosis not present

## 2023-10-31 DIAGNOSIS — J479 Bronchiectasis, uncomplicated: Secondary | ICD-10-CM | POA: Diagnosis not present

## 2023-10-31 DIAGNOSIS — I5032 Chronic diastolic (congestive) heart failure: Secondary | ICD-10-CM | POA: Diagnosis not present

## 2023-10-31 DIAGNOSIS — N189 Chronic kidney disease, unspecified: Secondary | ICD-10-CM | POA: Diagnosis not present

## 2023-10-31 DIAGNOSIS — I25118 Atherosclerotic heart disease of native coronary artery with other forms of angina pectoris: Secondary | ICD-10-CM | POA: Diagnosis not present

## 2023-11-04 DIAGNOSIS — R413 Other amnesia: Secondary | ICD-10-CM | POA: Diagnosis not present

## 2023-11-04 DIAGNOSIS — M150 Primary generalized (osteo)arthritis: Secondary | ICD-10-CM | POA: Diagnosis not present

## 2023-11-04 DIAGNOSIS — N189 Chronic kidney disease, unspecified: Secondary | ICD-10-CM | POA: Diagnosis not present

## 2023-11-04 DIAGNOSIS — I25118 Atherosclerotic heart disease of native coronary artery with other forms of angina pectoris: Secondary | ICD-10-CM | POA: Diagnosis not present

## 2023-11-04 DIAGNOSIS — J479 Bronchiectasis, uncomplicated: Secondary | ICD-10-CM | POA: Diagnosis not present

## 2023-11-04 DIAGNOSIS — I5032 Chronic diastolic (congestive) heart failure: Secondary | ICD-10-CM | POA: Diagnosis not present

## 2023-11-04 DIAGNOSIS — I480 Paroxysmal atrial fibrillation: Secondary | ICD-10-CM | POA: Diagnosis not present

## 2023-11-04 DIAGNOSIS — Z8701 Personal history of pneumonia (recurrent): Secondary | ICD-10-CM | POA: Diagnosis not present

## 2023-11-04 DIAGNOSIS — I130 Hypertensive heart and chronic kidney disease with heart failure and stage 1 through stage 4 chronic kidney disease, or unspecified chronic kidney disease: Secondary | ICD-10-CM | POA: Diagnosis not present

## 2023-11-05 DIAGNOSIS — E782 Mixed hyperlipidemia: Secondary | ICD-10-CM | POA: Diagnosis not present

## 2023-11-05 DIAGNOSIS — I480 Paroxysmal atrial fibrillation: Secondary | ICD-10-CM | POA: Diagnosis not present

## 2023-11-05 DIAGNOSIS — S81812D Laceration without foreign body, left lower leg, subsequent encounter: Secondary | ICD-10-CM | POA: Diagnosis not present

## 2023-11-05 DIAGNOSIS — I2510 Atherosclerotic heart disease of native coronary artery without angina pectoris: Secondary | ICD-10-CM | POA: Diagnosis not present

## 2023-11-05 DIAGNOSIS — I119 Hypertensive heart disease without heart failure: Secondary | ICD-10-CM | POA: Diagnosis not present

## 2023-11-05 DIAGNOSIS — G4700 Insomnia, unspecified: Secondary | ICD-10-CM | POA: Diagnosis not present

## 2023-11-05 DIAGNOSIS — R7303 Prediabetes: Secondary | ICD-10-CM | POA: Diagnosis not present

## 2023-11-05 DIAGNOSIS — E038 Other specified hypothyroidism: Secondary | ICD-10-CM | POA: Diagnosis not present

## 2023-11-06 DIAGNOSIS — N189 Chronic kidney disease, unspecified: Secondary | ICD-10-CM | POA: Diagnosis not present

## 2023-11-06 DIAGNOSIS — I5032 Chronic diastolic (congestive) heart failure: Secondary | ICD-10-CM | POA: Diagnosis not present

## 2023-11-06 DIAGNOSIS — R413 Other amnesia: Secondary | ICD-10-CM | POA: Diagnosis not present

## 2023-11-06 DIAGNOSIS — Z8701 Personal history of pneumonia (recurrent): Secondary | ICD-10-CM | POA: Diagnosis not present

## 2023-11-06 DIAGNOSIS — M150 Primary generalized (osteo)arthritis: Secondary | ICD-10-CM | POA: Diagnosis not present

## 2023-11-06 DIAGNOSIS — J479 Bronchiectasis, uncomplicated: Secondary | ICD-10-CM | POA: Diagnosis not present

## 2023-11-06 DIAGNOSIS — I25118 Atherosclerotic heart disease of native coronary artery with other forms of angina pectoris: Secondary | ICD-10-CM | POA: Diagnosis not present

## 2023-11-06 DIAGNOSIS — I130 Hypertensive heart and chronic kidney disease with heart failure and stage 1 through stage 4 chronic kidney disease, or unspecified chronic kidney disease: Secondary | ICD-10-CM | POA: Diagnosis not present

## 2023-11-06 DIAGNOSIS — I480 Paroxysmal atrial fibrillation: Secondary | ICD-10-CM | POA: Diagnosis not present

## 2023-11-07 DIAGNOSIS — J479 Bronchiectasis, uncomplicated: Secondary | ICD-10-CM | POA: Diagnosis not present

## 2023-11-07 DIAGNOSIS — I25118 Atherosclerotic heart disease of native coronary artery with other forms of angina pectoris: Secondary | ICD-10-CM | POA: Diagnosis not present

## 2023-11-07 DIAGNOSIS — Z8701 Personal history of pneumonia (recurrent): Secondary | ICD-10-CM | POA: Diagnosis not present

## 2023-11-07 DIAGNOSIS — M150 Primary generalized (osteo)arthritis: Secondary | ICD-10-CM | POA: Diagnosis not present

## 2023-11-07 DIAGNOSIS — I130 Hypertensive heart and chronic kidney disease with heart failure and stage 1 through stage 4 chronic kidney disease, or unspecified chronic kidney disease: Secondary | ICD-10-CM | POA: Diagnosis not present

## 2023-11-07 DIAGNOSIS — I5032 Chronic diastolic (congestive) heart failure: Secondary | ICD-10-CM | POA: Diagnosis not present

## 2023-11-07 DIAGNOSIS — I480 Paroxysmal atrial fibrillation: Secondary | ICD-10-CM | POA: Diagnosis not present

## 2023-11-07 DIAGNOSIS — R413 Other amnesia: Secondary | ICD-10-CM | POA: Diagnosis not present

## 2023-11-07 DIAGNOSIS — N189 Chronic kidney disease, unspecified: Secondary | ICD-10-CM | POA: Diagnosis not present

## 2023-11-11 DIAGNOSIS — I480 Paroxysmal atrial fibrillation: Secondary | ICD-10-CM | POA: Diagnosis not present

## 2023-11-11 DIAGNOSIS — I130 Hypertensive heart and chronic kidney disease with heart failure and stage 1 through stage 4 chronic kidney disease, or unspecified chronic kidney disease: Secondary | ICD-10-CM | POA: Diagnosis not present

## 2023-11-11 DIAGNOSIS — I25118 Atherosclerotic heart disease of native coronary artery with other forms of angina pectoris: Secondary | ICD-10-CM | POA: Diagnosis not present

## 2023-11-11 DIAGNOSIS — Z8701 Personal history of pneumonia (recurrent): Secondary | ICD-10-CM | POA: Diagnosis not present

## 2023-11-11 DIAGNOSIS — N189 Chronic kidney disease, unspecified: Secondary | ICD-10-CM | POA: Diagnosis not present

## 2023-11-11 DIAGNOSIS — M150 Primary generalized (osteo)arthritis: Secondary | ICD-10-CM | POA: Diagnosis not present

## 2023-11-11 DIAGNOSIS — R413 Other amnesia: Secondary | ICD-10-CM | POA: Diagnosis not present

## 2023-11-11 DIAGNOSIS — I5032 Chronic diastolic (congestive) heart failure: Secondary | ICD-10-CM | POA: Diagnosis not present

## 2023-11-11 DIAGNOSIS — J479 Bronchiectasis, uncomplicated: Secondary | ICD-10-CM | POA: Diagnosis not present

## 2023-11-14 DIAGNOSIS — I25118 Atherosclerotic heart disease of native coronary artery with other forms of angina pectoris: Secondary | ICD-10-CM | POA: Diagnosis not present

## 2023-11-14 DIAGNOSIS — I130 Hypertensive heart and chronic kidney disease with heart failure and stage 1 through stage 4 chronic kidney disease, or unspecified chronic kidney disease: Secondary | ICD-10-CM | POA: Diagnosis not present

## 2023-11-14 DIAGNOSIS — Z8701 Personal history of pneumonia (recurrent): Secondary | ICD-10-CM | POA: Diagnosis not present

## 2023-11-14 DIAGNOSIS — K591 Functional diarrhea: Secondary | ICD-10-CM | POA: Diagnosis not present

## 2023-11-14 DIAGNOSIS — I480 Paroxysmal atrial fibrillation: Secondary | ICD-10-CM | POA: Diagnosis not present

## 2023-11-14 DIAGNOSIS — I5032 Chronic diastolic (congestive) heart failure: Secondary | ICD-10-CM | POA: Diagnosis not present

## 2023-11-14 DIAGNOSIS — J479 Bronchiectasis, uncomplicated: Secondary | ICD-10-CM | POA: Diagnosis not present

## 2023-11-14 DIAGNOSIS — S81819A Laceration without foreign body, unspecified lower leg, initial encounter: Secondary | ICD-10-CM | POA: Diagnosis not present

## 2023-11-14 DIAGNOSIS — M150 Primary generalized (osteo)arthritis: Secondary | ICD-10-CM | POA: Diagnosis not present

## 2023-11-14 DIAGNOSIS — R413 Other amnesia: Secondary | ICD-10-CM | POA: Diagnosis not present

## 2023-11-14 DIAGNOSIS — N189 Chronic kidney disease, unspecified: Secondary | ICD-10-CM | POA: Diagnosis not present

## 2023-11-27 DIAGNOSIS — J151 Pneumonia due to Pseudomonas: Secondary | ICD-10-CM | POA: Diagnosis not present

## 2023-12-12 DIAGNOSIS — M1612 Unilateral primary osteoarthritis, left hip: Secondary | ICD-10-CM | POA: Diagnosis not present

## 2023-12-12 DIAGNOSIS — I700 Atherosclerosis of aorta: Secondary | ICD-10-CM | POA: Diagnosis not present

## 2023-12-12 DIAGNOSIS — R262 Difficulty in walking, not elsewhere classified: Secondary | ICD-10-CM | POA: Diagnosis not present

## 2023-12-12 DIAGNOSIS — I2510 Atherosclerotic heart disease of native coronary artery without angina pectoris: Secondary | ICD-10-CM | POA: Diagnosis not present

## 2023-12-13 DIAGNOSIS — R262 Difficulty in walking, not elsewhere classified: Secondary | ICD-10-CM | POA: Diagnosis not present

## 2023-12-13 DIAGNOSIS — M1612 Unilateral primary osteoarthritis, left hip: Secondary | ICD-10-CM | POA: Diagnosis not present

## 2023-12-13 DIAGNOSIS — I2510 Atherosclerotic heart disease of native coronary artery without angina pectoris: Secondary | ICD-10-CM | POA: Diagnosis not present

## 2023-12-13 DIAGNOSIS — I700 Atherosclerosis of aorta: Secondary | ICD-10-CM | POA: Diagnosis not present

## 2023-12-14 DIAGNOSIS — S66912A Strain of unspecified muscle, fascia and tendon at wrist and hand level, left hand, initial encounter: Secondary | ICD-10-CM | POA: Diagnosis not present

## 2023-12-14 DIAGNOSIS — M25532 Pain in left wrist: Secondary | ICD-10-CM | POA: Diagnosis not present

## 2023-12-14 DIAGNOSIS — I10 Essential (primary) hypertension: Secondary | ICD-10-CM | POA: Diagnosis not present

## 2023-12-14 DIAGNOSIS — X58XXXA Exposure to other specified factors, initial encounter: Secondary | ICD-10-CM | POA: Diagnosis not present

## 2023-12-17 DIAGNOSIS — N3281 Overactive bladder: Secondary | ICD-10-CM | POA: Diagnosis not present

## 2023-12-17 DIAGNOSIS — N390 Urinary tract infection, site not specified: Secondary | ICD-10-CM | POA: Diagnosis not present

## 2023-12-17 DIAGNOSIS — R300 Dysuria: Secondary | ICD-10-CM | POA: Diagnosis not present

## 2023-12-18 DIAGNOSIS — I700 Atherosclerosis of aorta: Secondary | ICD-10-CM | POA: Diagnosis not present

## 2023-12-18 DIAGNOSIS — R262 Difficulty in walking, not elsewhere classified: Secondary | ICD-10-CM | POA: Diagnosis not present

## 2023-12-18 DIAGNOSIS — M1612 Unilateral primary osteoarthritis, left hip: Secondary | ICD-10-CM | POA: Diagnosis not present

## 2023-12-18 DIAGNOSIS — I2510 Atherosclerotic heart disease of native coronary artery without angina pectoris: Secondary | ICD-10-CM | POA: Diagnosis not present

## 2023-12-20 DIAGNOSIS — I700 Atherosclerosis of aorta: Secondary | ICD-10-CM | POA: Diagnosis not present

## 2023-12-20 DIAGNOSIS — R262 Difficulty in walking, not elsewhere classified: Secondary | ICD-10-CM | POA: Diagnosis not present

## 2023-12-20 DIAGNOSIS — I2510 Atherosclerotic heart disease of native coronary artery without angina pectoris: Secondary | ICD-10-CM | POA: Diagnosis not present

## 2023-12-20 DIAGNOSIS — M1612 Unilateral primary osteoarthritis, left hip: Secondary | ICD-10-CM | POA: Diagnosis not present

## 2023-12-23 DIAGNOSIS — I119 Hypertensive heart disease without heart failure: Secondary | ICD-10-CM | POA: Diagnosis not present

## 2023-12-23 DIAGNOSIS — R7303 Prediabetes: Secondary | ICD-10-CM | POA: Diagnosis not present

## 2023-12-23 DIAGNOSIS — E038 Other specified hypothyroidism: Secondary | ICD-10-CM | POA: Diagnosis not present

## 2023-12-23 DIAGNOSIS — I480 Paroxysmal atrial fibrillation: Secondary | ICD-10-CM | POA: Diagnosis not present

## 2023-12-23 DIAGNOSIS — I2510 Atherosclerotic heart disease of native coronary artery without angina pectoris: Secondary | ICD-10-CM | POA: Diagnosis not present

## 2023-12-23 DIAGNOSIS — E782 Mixed hyperlipidemia: Secondary | ICD-10-CM | POA: Diagnosis not present

## 2023-12-23 DIAGNOSIS — N3281 Overactive bladder: Secondary | ICD-10-CM | POA: Diagnosis not present

## 2023-12-24 DIAGNOSIS — I5032 Chronic diastolic (congestive) heart failure: Secondary | ICD-10-CM | POA: Diagnosis not present

## 2023-12-24 DIAGNOSIS — J479 Bronchiectasis, uncomplicated: Secondary | ICD-10-CM | POA: Diagnosis not present

## 2023-12-24 DIAGNOSIS — I25118 Atherosclerotic heart disease of native coronary artery with other forms of angina pectoris: Secondary | ICD-10-CM | POA: Diagnosis not present

## 2023-12-24 DIAGNOSIS — M150 Primary generalized (osteo)arthritis: Secondary | ICD-10-CM | POA: Diagnosis not present

## 2023-12-24 DIAGNOSIS — I10 Essential (primary) hypertension: Secondary | ICD-10-CM | POA: Diagnosis not present

## 2023-12-24 DIAGNOSIS — I480 Paroxysmal atrial fibrillation: Secondary | ICD-10-CM | POA: Diagnosis not present

## 2023-12-24 DIAGNOSIS — E039 Hypothyroidism, unspecified: Secondary | ICD-10-CM | POA: Diagnosis not present

## 2023-12-24 DIAGNOSIS — A310 Pulmonary mycobacterial infection: Secondary | ICD-10-CM | POA: Diagnosis not present

## 2023-12-24 DIAGNOSIS — I495 Sick sinus syndrome: Secondary | ICD-10-CM | POA: Diagnosis not present

## 2023-12-25 DIAGNOSIS — R7303 Prediabetes: Secondary | ICD-10-CM | POA: Diagnosis not present

## 2023-12-25 DIAGNOSIS — M1612 Unilateral primary osteoarthritis, left hip: Secondary | ICD-10-CM | POA: Diagnosis not present

## 2023-12-25 DIAGNOSIS — E038 Other specified hypothyroidism: Secondary | ICD-10-CM | POA: Diagnosis not present

## 2023-12-25 DIAGNOSIS — E782 Mixed hyperlipidemia: Secondary | ICD-10-CM | POA: Diagnosis not present

## 2023-12-25 DIAGNOSIS — I700 Atherosclerosis of aorta: Secondary | ICD-10-CM | POA: Diagnosis not present

## 2023-12-25 DIAGNOSIS — I2510 Atherosclerotic heart disease of native coronary artery without angina pectoris: Secondary | ICD-10-CM | POA: Diagnosis not present

## 2023-12-25 DIAGNOSIS — R262 Difficulty in walking, not elsewhere classified: Secondary | ICD-10-CM | POA: Diagnosis not present

## 2023-12-27 DIAGNOSIS — I2510 Atherosclerotic heart disease of native coronary artery without angina pectoris: Secondary | ICD-10-CM | POA: Diagnosis not present

## 2023-12-27 DIAGNOSIS — I700 Atherosclerosis of aorta: Secondary | ICD-10-CM | POA: Diagnosis not present

## 2023-12-27 DIAGNOSIS — M1612 Unilateral primary osteoarthritis, left hip: Secondary | ICD-10-CM | POA: Diagnosis not present

## 2023-12-27 DIAGNOSIS — R262 Difficulty in walking, not elsewhere classified: Secondary | ICD-10-CM | POA: Diagnosis not present

## 2023-12-31 DIAGNOSIS — I2510 Atherosclerotic heart disease of native coronary artery without angina pectoris: Secondary | ICD-10-CM | POA: Diagnosis not present

## 2023-12-31 DIAGNOSIS — M1612 Unilateral primary osteoarthritis, left hip: Secondary | ICD-10-CM | POA: Diagnosis not present

## 2023-12-31 DIAGNOSIS — I700 Atherosclerosis of aorta: Secondary | ICD-10-CM | POA: Diagnosis not present

## 2024-01-06 DIAGNOSIS — I700 Atherosclerosis of aorta: Secondary | ICD-10-CM | POA: Diagnosis not present

## 2024-01-06 DIAGNOSIS — R262 Difficulty in walking, not elsewhere classified: Secondary | ICD-10-CM | POA: Diagnosis not present

## 2024-01-06 DIAGNOSIS — M1612 Unilateral primary osteoarthritis, left hip: Secondary | ICD-10-CM | POA: Diagnosis not present

## 2024-01-06 DIAGNOSIS — I2510 Atherosclerotic heart disease of native coronary artery without angina pectoris: Secondary | ICD-10-CM | POA: Diagnosis not present

## 2024-01-08 DIAGNOSIS — I2510 Atherosclerotic heart disease of native coronary artery without angina pectoris: Secondary | ICD-10-CM | POA: Diagnosis not present

## 2024-01-08 DIAGNOSIS — I700 Atherosclerosis of aorta: Secondary | ICD-10-CM | POA: Diagnosis not present

## 2024-01-08 DIAGNOSIS — M1612 Unilateral primary osteoarthritis, left hip: Secondary | ICD-10-CM | POA: Diagnosis not present

## 2024-01-08 DIAGNOSIS — R262 Difficulty in walking, not elsewhere classified: Secondary | ICD-10-CM | POA: Diagnosis not present

## 2024-01-10 DIAGNOSIS — I700 Atherosclerosis of aorta: Secondary | ICD-10-CM | POA: Diagnosis not present

## 2024-01-10 DIAGNOSIS — M1612 Unilateral primary osteoarthritis, left hip: Secondary | ICD-10-CM | POA: Diagnosis not present

## 2024-01-10 DIAGNOSIS — I2510 Atherosclerotic heart disease of native coronary artery without angina pectoris: Secondary | ICD-10-CM | POA: Diagnosis not present

## 2024-01-13 DIAGNOSIS — R262 Difficulty in walking, not elsewhere classified: Secondary | ICD-10-CM | POA: Diagnosis not present

## 2024-01-13 DIAGNOSIS — I700 Atherosclerosis of aorta: Secondary | ICD-10-CM | POA: Diagnosis not present

## 2024-01-13 DIAGNOSIS — M1612 Unilateral primary osteoarthritis, left hip: Secondary | ICD-10-CM | POA: Diagnosis not present

## 2024-01-13 DIAGNOSIS — I2510 Atherosclerotic heart disease of native coronary artery without angina pectoris: Secondary | ICD-10-CM | POA: Diagnosis not present

## 2024-01-14 DIAGNOSIS — E038 Other specified hypothyroidism: Secondary | ICD-10-CM | POA: Diagnosis not present

## 2024-01-14 DIAGNOSIS — I480 Paroxysmal atrial fibrillation: Secondary | ICD-10-CM | POA: Diagnosis not present

## 2024-01-14 DIAGNOSIS — N3281 Overactive bladder: Secondary | ICD-10-CM | POA: Diagnosis not present

## 2024-01-14 DIAGNOSIS — I2510 Atherosclerotic heart disease of native coronary artery without angina pectoris: Secondary | ICD-10-CM | POA: Diagnosis not present

## 2024-01-14 DIAGNOSIS — R7303 Prediabetes: Secondary | ICD-10-CM | POA: Diagnosis not present

## 2024-01-14 DIAGNOSIS — G4700 Insomnia, unspecified: Secondary | ICD-10-CM | POA: Diagnosis not present

## 2024-01-14 DIAGNOSIS — I119 Hypertensive heart disease without heart failure: Secondary | ICD-10-CM | POA: Diagnosis not present

## 2024-01-14 DIAGNOSIS — M6281 Muscle weakness (generalized): Secondary | ICD-10-CM | POA: Diagnosis not present

## 2024-01-14 DIAGNOSIS — E782 Mixed hyperlipidemia: Secondary | ICD-10-CM | POA: Diagnosis not present

## 2024-01-15 ENCOUNTER — Ambulatory Visit: Payer: Medicare PPO

## 2024-01-15 DIAGNOSIS — M1612 Unilateral primary osteoarthritis, left hip: Secondary | ICD-10-CM | POA: Diagnosis not present

## 2024-01-15 DIAGNOSIS — I700 Atherosclerosis of aorta: Secondary | ICD-10-CM | POA: Diagnosis not present

## 2024-01-15 DIAGNOSIS — I2510 Atherosclerotic heart disease of native coronary artery without angina pectoris: Secondary | ICD-10-CM | POA: Diagnosis not present

## 2024-01-15 DIAGNOSIS — R262 Difficulty in walking, not elsewhere classified: Secondary | ICD-10-CM | POA: Diagnosis not present

## 2024-01-16 DIAGNOSIS — I2510 Atherosclerotic heart disease of native coronary artery without angina pectoris: Secondary | ICD-10-CM | POA: Diagnosis not present

## 2024-01-16 DIAGNOSIS — I700 Atherosclerosis of aorta: Secondary | ICD-10-CM | POA: Diagnosis not present

## 2024-01-16 DIAGNOSIS — M1612 Unilateral primary osteoarthritis, left hip: Secondary | ICD-10-CM | POA: Diagnosis not present

## 2024-01-18 DIAGNOSIS — I129 Hypertensive chronic kidney disease with stage 1 through stage 4 chronic kidney disease, or unspecified chronic kidney disease: Secondary | ICD-10-CM | POA: Diagnosis not present

## 2024-01-18 DIAGNOSIS — N189 Chronic kidney disease, unspecified: Secondary | ICD-10-CM | POA: Diagnosis not present

## 2024-01-18 DIAGNOSIS — Z743 Need for continuous supervision: Secondary | ICD-10-CM | POA: Diagnosis not present

## 2024-01-18 DIAGNOSIS — Z043 Encounter for examination and observation following other accident: Secondary | ICD-10-CM | POA: Diagnosis not present

## 2024-01-18 DIAGNOSIS — M25551 Pain in right hip: Secondary | ICD-10-CM | POA: Diagnosis not present

## 2024-01-18 DIAGNOSIS — Z7901 Long term (current) use of anticoagulants: Secondary | ICD-10-CM | POA: Diagnosis not present

## 2024-01-18 DIAGNOSIS — E039 Hypothyroidism, unspecified: Secondary | ICD-10-CM | POA: Diagnosis not present

## 2024-01-18 DIAGNOSIS — I2510 Atherosclerotic heart disease of native coronary artery without angina pectoris: Secondary | ICD-10-CM | POA: Diagnosis not present

## 2024-01-18 DIAGNOSIS — R42 Dizziness and giddiness: Secondary | ICD-10-CM | POA: Diagnosis not present

## 2024-01-18 DIAGNOSIS — M25552 Pain in left hip: Secondary | ICD-10-CM | POA: Diagnosis not present

## 2024-01-18 DIAGNOSIS — I4891 Unspecified atrial fibrillation: Secondary | ICD-10-CM | POA: Diagnosis not present

## 2024-01-20 DIAGNOSIS — R262 Difficulty in walking, not elsewhere classified: Secondary | ICD-10-CM | POA: Diagnosis not present

## 2024-01-20 DIAGNOSIS — I700 Atherosclerosis of aorta: Secondary | ICD-10-CM | POA: Diagnosis not present

## 2024-01-20 DIAGNOSIS — I2510 Atherosclerotic heart disease of native coronary artery without angina pectoris: Secondary | ICD-10-CM | POA: Diagnosis not present

## 2024-01-20 DIAGNOSIS — M1612 Unilateral primary osteoarthritis, left hip: Secondary | ICD-10-CM | POA: Diagnosis not present

## 2024-01-22 ENCOUNTER — Ambulatory Visit: Admitting: Emergency Medicine

## 2024-01-22 DIAGNOSIS — R262 Difficulty in walking, not elsewhere classified: Secondary | ICD-10-CM | POA: Diagnosis not present

## 2024-01-22 DIAGNOSIS — I700 Atherosclerosis of aorta: Secondary | ICD-10-CM | POA: Diagnosis not present

## 2024-01-22 DIAGNOSIS — I2510 Atherosclerotic heart disease of native coronary artery without angina pectoris: Secondary | ICD-10-CM | POA: Diagnosis not present

## 2024-01-22 DIAGNOSIS — M1612 Unilateral primary osteoarthritis, left hip: Secondary | ICD-10-CM | POA: Diagnosis not present

## 2024-01-24 DIAGNOSIS — I2510 Atherosclerotic heart disease of native coronary artery without angina pectoris: Secondary | ICD-10-CM | POA: Diagnosis not present

## 2024-01-24 DIAGNOSIS — M1612 Unilateral primary osteoarthritis, left hip: Secondary | ICD-10-CM | POA: Diagnosis not present

## 2024-01-24 DIAGNOSIS — I700 Atherosclerosis of aorta: Secondary | ICD-10-CM | POA: Diagnosis not present

## 2024-01-26 DIAGNOSIS — W1830XA Fall on same level, unspecified, initial encounter: Secondary | ICD-10-CM | POA: Diagnosis not present

## 2024-01-26 DIAGNOSIS — I119 Hypertensive heart disease without heart failure: Secondary | ICD-10-CM | POA: Diagnosis not present

## 2024-01-26 DIAGNOSIS — I2510 Atherosclerotic heart disease of native coronary artery without angina pectoris: Secondary | ICD-10-CM | POA: Diagnosis not present

## 2024-01-26 DIAGNOSIS — I480 Paroxysmal atrial fibrillation: Secondary | ICD-10-CM | POA: Diagnosis not present

## 2024-01-27 DIAGNOSIS — J151 Pneumonia due to Pseudomonas: Secondary | ICD-10-CM | POA: Diagnosis not present

## 2024-01-27 DIAGNOSIS — I2510 Atherosclerotic heart disease of native coronary artery without angina pectoris: Secondary | ICD-10-CM | POA: Diagnosis not present

## 2024-01-27 DIAGNOSIS — M1612 Unilateral primary osteoarthritis, left hip: Secondary | ICD-10-CM | POA: Diagnosis not present

## 2024-01-27 DIAGNOSIS — I700 Atherosclerosis of aorta: Secondary | ICD-10-CM | POA: Diagnosis not present

## 2024-01-27 DIAGNOSIS — R262 Difficulty in walking, not elsewhere classified: Secondary | ICD-10-CM | POA: Diagnosis not present

## 2024-01-31 DIAGNOSIS — J208 Acute bronchitis due to other specified organisms: Secondary | ICD-10-CM | POA: Diagnosis not present

## 2024-01-31 DIAGNOSIS — B9689 Other specified bacterial agents as the cause of diseases classified elsewhere: Secondary | ICD-10-CM | POA: Diagnosis not present

## 2024-01-31 DIAGNOSIS — I10 Essential (primary) hypertension: Secondary | ICD-10-CM | POA: Diagnosis not present

## 2024-01-31 DIAGNOSIS — R0981 Nasal congestion: Secondary | ICD-10-CM | POA: Diagnosis not present

## 2024-02-03 DIAGNOSIS — E782 Mixed hyperlipidemia: Secondary | ICD-10-CM | POA: Diagnosis not present

## 2024-02-03 DIAGNOSIS — I495 Sick sinus syndrome: Secondary | ICD-10-CM | POA: Diagnosis not present

## 2024-02-03 DIAGNOSIS — Z950 Presence of cardiac pacemaker: Secondary | ICD-10-CM | POA: Diagnosis not present

## 2024-02-03 DIAGNOSIS — Z952 Presence of prosthetic heart valve: Secondary | ICD-10-CM | POA: Diagnosis not present

## 2024-02-03 DIAGNOSIS — I4891 Unspecified atrial fibrillation: Secondary | ICD-10-CM | POA: Diagnosis not present

## 2024-02-04 DIAGNOSIS — I700 Atherosclerosis of aorta: Secondary | ICD-10-CM | POA: Diagnosis not present

## 2024-02-04 DIAGNOSIS — I2510 Atherosclerotic heart disease of native coronary artery without angina pectoris: Secondary | ICD-10-CM | POA: Diagnosis not present

## 2024-02-04 DIAGNOSIS — M1612 Unilateral primary osteoarthritis, left hip: Secondary | ICD-10-CM | POA: Diagnosis not present

## 2024-02-04 DIAGNOSIS — R262 Difficulty in walking, not elsewhere classified: Secondary | ICD-10-CM | POA: Diagnosis not present

## 2024-02-05 DIAGNOSIS — R262 Difficulty in walking, not elsewhere classified: Secondary | ICD-10-CM | POA: Diagnosis not present

## 2024-02-05 DIAGNOSIS — M1612 Unilateral primary osteoarthritis, left hip: Secondary | ICD-10-CM | POA: Diagnosis not present

## 2024-02-05 DIAGNOSIS — I2510 Atherosclerotic heart disease of native coronary artery without angina pectoris: Secondary | ICD-10-CM | POA: Diagnosis not present

## 2024-02-05 DIAGNOSIS — I700 Atherosclerosis of aorta: Secondary | ICD-10-CM | POA: Diagnosis not present

## 2024-02-06 DIAGNOSIS — I2510 Atherosclerotic heart disease of native coronary artery without angina pectoris: Secondary | ICD-10-CM | POA: Diagnosis not present

## 2024-02-06 DIAGNOSIS — I700 Atherosclerosis of aorta: Secondary | ICD-10-CM | POA: Diagnosis not present

## 2024-02-06 DIAGNOSIS — M1612 Unilateral primary osteoarthritis, left hip: Secondary | ICD-10-CM | POA: Diagnosis not present

## 2024-02-06 DIAGNOSIS — R262 Difficulty in walking, not elsewhere classified: Secondary | ICD-10-CM | POA: Diagnosis not present

## 2024-02-07 DIAGNOSIS — I700 Atherosclerosis of aorta: Secondary | ICD-10-CM | POA: Diagnosis not present

## 2024-02-07 DIAGNOSIS — R262 Difficulty in walking, not elsewhere classified: Secondary | ICD-10-CM | POA: Diagnosis not present

## 2024-02-07 DIAGNOSIS — I2510 Atherosclerotic heart disease of native coronary artery without angina pectoris: Secondary | ICD-10-CM | POA: Diagnosis not present

## 2024-02-07 DIAGNOSIS — M1612 Unilateral primary osteoarthritis, left hip: Secondary | ICD-10-CM | POA: Diagnosis not present

## 2024-02-14 ENCOUNTER — Ambulatory Visit: Admitting: Emergency Medicine

## 2024-04-15 ENCOUNTER — Ambulatory Visit

## 2024-07-15 ENCOUNTER — Ambulatory Visit

## 2024-10-14 ENCOUNTER — Ambulatory Visit

## 2025-01-13 ENCOUNTER — Ambulatory Visit

## 2025-04-14 ENCOUNTER — Ambulatory Visit
